# Patient Record
Sex: Female | Born: 1970
Health system: Southern US, Community
[De-identification: ages and names within clinical notes are randomized; demographics above are authoritative.]

## PROBLEM LIST (undated history)

## (undated) ENCOUNTER — Ambulatory Visit (HOSPITAL_BASED_OUTPATIENT_CLINIC_OR_DEPARTMENT_OTHER): Admission: EM | Source: Home / Self Care

## (undated) DIAGNOSIS — Z87442 Personal history of urinary calculi: Secondary | ICD-10-CM

## (undated) DIAGNOSIS — K591 Functional diarrhea: Secondary | ICD-10-CM

## (undated) DIAGNOSIS — F332 Major depressive disorder, recurrent severe without psychotic features: Secondary | ICD-10-CM

## (undated) DIAGNOSIS — G4730 Sleep apnea, unspecified: Secondary | ICD-10-CM

## (undated) DIAGNOSIS — R51 Headache: Secondary | ICD-10-CM

## (undated) DIAGNOSIS — F200 Paranoid schizophrenia: Secondary | ICD-10-CM

## (undated) DIAGNOSIS — D509 Iron deficiency anemia, unspecified: Secondary | ICD-10-CM

## (undated) DIAGNOSIS — Z915 Personal history of self-harm: Secondary | ICD-10-CM

## (undated) DIAGNOSIS — E785 Hyperlipidemia, unspecified: Secondary | ICD-10-CM

## (undated) DIAGNOSIS — C4491 Basal cell carcinoma of skin, unspecified: Secondary | ICD-10-CM

## (undated) DIAGNOSIS — F419 Anxiety disorder, unspecified: Secondary | ICD-10-CM

## (undated) DIAGNOSIS — T50902A Poisoning by unspecified drugs, medicaments and biological substances, intentional self-harm, initial encounter: Secondary | ICD-10-CM

## (undated) DIAGNOSIS — Z86711 Personal history of pulmonary embolism: Secondary | ICD-10-CM

## (undated) DIAGNOSIS — K219 Gastro-esophageal reflux disease without esophagitis: Secondary | ICD-10-CM

## (undated) DIAGNOSIS — F209 Schizophrenia, unspecified: Secondary | ICD-10-CM

## (undated) DIAGNOSIS — E559 Vitamin D deficiency, unspecified: Secondary | ICD-10-CM

## (undated) DIAGNOSIS — D649 Anemia, unspecified: Secondary | ICD-10-CM

## (undated) DIAGNOSIS — M1990 Unspecified osteoarthritis, unspecified site: Secondary | ICD-10-CM

## (undated) DIAGNOSIS — K5792 Diverticulitis of intestine, part unspecified, without perforation or abscess without bleeding: Secondary | ICD-10-CM

## (undated) DIAGNOSIS — T50901A Poisoning by unspecified drugs, medicaments and biological substances, accidental (unintentional), initial encounter: Secondary | ICD-10-CM

## (undated) DIAGNOSIS — N200 Calculus of kidney: Secondary | ICD-10-CM

## (undated) DIAGNOSIS — R519 Headache, unspecified: Secondary | ICD-10-CM

## (undated) DIAGNOSIS — G43909 Migraine, unspecified, not intractable, without status migrainosus: Secondary | ICD-10-CM

## (undated) DIAGNOSIS — D352 Benign neoplasm of pituitary gland: Secondary | ICD-10-CM

## (undated) DIAGNOSIS — Z9289 Personal history of other medical treatment: Secondary | ICD-10-CM

## (undated) DIAGNOSIS — I10 Essential (primary) hypertension: Secondary | ICD-10-CM

## (undated) DIAGNOSIS — Z8659 Personal history of other mental and behavioral disorders: Secondary | ICD-10-CM

## (undated) DIAGNOSIS — Z9151 Personal history of suicidal behavior: Secondary | ICD-10-CM

## (undated) DIAGNOSIS — N289 Disorder of kidney and ureter, unspecified: Secondary | ICD-10-CM

## (undated) DIAGNOSIS — R011 Cardiac murmur, unspecified: Secondary | ICD-10-CM

## (undated) DIAGNOSIS — F609 Personality disorder, unspecified: Secondary | ICD-10-CM

## (undated) DIAGNOSIS — K589 Irritable bowel syndrome without diarrhea: Secondary | ICD-10-CM

## (undated) DIAGNOSIS — F32A Depression, unspecified: Secondary | ICD-10-CM

## (undated) HISTORY — DX: Personal history of suicidal behavior: Z91.51

## (undated) HISTORY — DX: Personal history of self-harm: Z91.5

## (undated) HISTORY — PX: OTHER SURGICAL HISTORY: SHX169

## (undated) HISTORY — DX: Basal cell carcinoma of skin, unspecified: C44.91

## (undated) HISTORY — DX: Benign neoplasm of pituitary gland: D35.2

## (undated) HISTORY — PX: COLOSTOMY REVERSAL: SHX5782

## (undated) HISTORY — DX: Sleep apnea, unspecified: G47.30

## (undated) HISTORY — DX: Personal history of other mental and behavioral disorders: Z86.59

## (undated) HISTORY — DX: Migraine, unspecified, not intractable, without status migrainosus: G43.909

## (undated) HISTORY — DX: Iron deficiency anemia, unspecified: D50.9

## (undated) HISTORY — DX: Major depressive disorder, recurrent severe without psychotic features: F33.2

## (undated) HISTORY — PX: TUBAL LIGATION: SHX77

## (undated) HISTORY — DX: Vitamin D deficiency, unspecified: E55.9

## (undated) HISTORY — PX: POSTERIOR TIBIAL TENDON REPAIR: SHX6039

## (undated) HISTORY — DX: Irritable bowel syndrome, unspecified: K58.9

---

## 1998-10-29 ENCOUNTER — Ambulatory Visit (HOSPITAL_COMMUNITY): Admission: RE | Admit: 1998-10-29 | Discharge: 1998-10-29 | Payer: Self-pay | Admitting: Family Medicine

## 1999-04-13 ENCOUNTER — Ambulatory Visit (HOSPITAL_COMMUNITY): Admission: RE | Admit: 1999-04-13 | Discharge: 1999-04-13 | Payer: Self-pay | Admitting: Family Medicine

## 1999-06-10 ENCOUNTER — Emergency Department (HOSPITAL_COMMUNITY): Admission: EM | Admit: 1999-06-10 | Discharge: 1999-06-10 | Payer: Self-pay | Admitting: Emergency Medicine

## 1999-06-12 ENCOUNTER — Inpatient Hospital Stay (HOSPITAL_COMMUNITY): Admission: EM | Admit: 1999-06-12 | Discharge: 1999-06-13 | Payer: Self-pay | Admitting: Emergency Medicine

## 1999-06-12 ENCOUNTER — Encounter: Payer: Self-pay | Admitting: Internal Medicine

## 1999-09-23 ENCOUNTER — Ambulatory Visit (HOSPITAL_COMMUNITY): Admission: RE | Admit: 1999-09-23 | Discharge: 1999-09-23 | Payer: Self-pay | Admitting: Obstetrics and Gynecology

## 1999-09-24 ENCOUNTER — Inpatient Hospital Stay: Admission: AD | Admit: 1999-09-24 | Discharge: 1999-09-24 | Payer: Self-pay | Admitting: Obstetrics and Gynecology

## 1999-12-08 ENCOUNTER — Encounter: Admission: RE | Admit: 1999-12-08 | Discharge: 2000-03-07 | Payer: Self-pay | Admitting: Family Medicine

## 1999-12-30 ENCOUNTER — Other Ambulatory Visit: Admission: RE | Admit: 1999-12-30 | Discharge: 1999-12-30 | Payer: Self-pay | Admitting: Obstetrics and Gynecology

## 2000-01-01 ENCOUNTER — Inpatient Hospital Stay (HOSPITAL_COMMUNITY): Admission: EM | Admit: 2000-01-01 | Discharge: 2000-01-04 | Payer: Self-pay | Admitting: Emergency Medicine

## 2000-03-14 ENCOUNTER — Encounter: Admission: RE | Admit: 2000-03-14 | Discharge: 2000-06-12 | Payer: Self-pay | Admitting: Family Medicine

## 2000-05-09 ENCOUNTER — Emergency Department (HOSPITAL_COMMUNITY): Admission: EM | Admit: 2000-05-09 | Discharge: 2000-05-09 | Payer: Self-pay | Admitting: Emergency Medicine

## 2000-05-10 ENCOUNTER — Emergency Department (HOSPITAL_COMMUNITY): Admission: EM | Admit: 2000-05-10 | Discharge: 2000-05-10 | Payer: Self-pay | Admitting: Emergency Medicine

## 2000-05-11 ENCOUNTER — Inpatient Hospital Stay (HOSPITAL_COMMUNITY): Admission: EM | Admit: 2000-05-11 | Discharge: 2000-05-14 | Payer: Self-pay | Admitting: *Deleted

## 2000-06-21 ENCOUNTER — Encounter: Admission: RE | Admit: 2000-06-21 | Discharge: 2000-09-19 | Payer: Self-pay | Admitting: Family Medicine

## 2001-01-02 ENCOUNTER — Inpatient Hospital Stay (HOSPITAL_COMMUNITY): Admission: EM | Admit: 2001-01-02 | Discharge: 2001-01-04 | Payer: Self-pay | Admitting: Emergency Medicine

## 2001-01-02 ENCOUNTER — Encounter: Payer: Self-pay | Admitting: Emergency Medicine

## 2001-01-04 ENCOUNTER — Inpatient Hospital Stay (HOSPITAL_COMMUNITY): Admission: EM | Admit: 2001-01-04 | Discharge: 2001-01-06 | Payer: Self-pay | Admitting: *Deleted

## 2001-02-02 ENCOUNTER — Other Ambulatory Visit: Admission: RE | Admit: 2001-02-02 | Discharge: 2001-02-02 | Payer: Self-pay | Admitting: Obstetrics and Gynecology

## 2002-02-02 ENCOUNTER — Encounter: Payer: Self-pay | Admitting: Obstetrics and Gynecology

## 2002-02-02 ENCOUNTER — Encounter: Admission: RE | Admit: 2002-02-02 | Discharge: 2002-02-02 | Payer: Self-pay | Admitting: Obstetrics and Gynecology

## 2002-04-16 ENCOUNTER — Other Ambulatory Visit: Admission: RE | Admit: 2002-04-16 | Discharge: 2002-04-16 | Payer: Self-pay | Admitting: Obstetrics and Gynecology

## 2002-09-28 ENCOUNTER — Ambulatory Visit (HOSPITAL_COMMUNITY): Admission: RE | Admit: 2002-09-28 | Discharge: 2002-09-28 | Payer: Self-pay | Admitting: Family Medicine

## 2002-09-28 ENCOUNTER — Encounter: Payer: Self-pay | Admitting: Family Medicine

## 2003-05-22 ENCOUNTER — Other Ambulatory Visit: Admission: RE | Admit: 2003-05-22 | Discharge: 2003-05-22 | Payer: Self-pay | Admitting: Obstetrics and Gynecology

## 2003-06-17 ENCOUNTER — Encounter: Admission: RE | Admit: 2003-06-17 | Discharge: 2003-06-17 | Payer: Self-pay | Admitting: Family Medicine

## 2003-06-17 ENCOUNTER — Encounter: Payer: Self-pay | Admitting: Family Medicine

## 2003-10-09 ENCOUNTER — Inpatient Hospital Stay (HOSPITAL_COMMUNITY): Admission: EM | Admit: 2003-10-09 | Discharge: 2003-10-11 | Payer: Self-pay | Admitting: Psychiatry

## 2003-10-13 ENCOUNTER — Emergency Department (HOSPITAL_COMMUNITY): Admission: EM | Admit: 2003-10-13 | Discharge: 2003-10-14 | Payer: Self-pay | Admitting: Emergency Medicine

## 2003-10-14 ENCOUNTER — Encounter: Payer: Self-pay | Admitting: Emergency Medicine

## 2003-10-14 ENCOUNTER — Emergency Department (HOSPITAL_COMMUNITY): Admission: EM | Admit: 2003-10-14 | Discharge: 2003-10-14 | Payer: Self-pay | Admitting: Emergency Medicine

## 2004-05-28 ENCOUNTER — Other Ambulatory Visit: Admission: RE | Admit: 2004-05-28 | Discharge: 2004-05-28 | Payer: Self-pay | Admitting: Obstetrics and Gynecology

## 2004-09-09 ENCOUNTER — Inpatient Hospital Stay (HOSPITAL_COMMUNITY): Admission: RE | Admit: 2004-09-09 | Discharge: 2004-10-01 | Payer: Self-pay | Admitting: Psychiatry

## 2004-09-09 ENCOUNTER — Ambulatory Visit: Payer: Self-pay | Admitting: Psychiatry

## 2004-09-09 ENCOUNTER — Emergency Department (HOSPITAL_COMMUNITY): Admission: EM | Admit: 2004-09-09 | Discharge: 2004-09-10 | Payer: Self-pay | Admitting: Emergency Medicine

## 2004-09-10 ENCOUNTER — Emergency Department (HOSPITAL_COMMUNITY): Admission: EM | Admit: 2004-09-10 | Discharge: 2004-09-11 | Payer: Self-pay | Admitting: Emergency Medicine

## 2004-09-14 ENCOUNTER — Emergency Department (HOSPITAL_COMMUNITY): Admission: EM | Admit: 2004-09-14 | Discharge: 2004-09-14 | Payer: Self-pay | Admitting: Emergency Medicine

## 2004-09-27 ENCOUNTER — Emergency Department (HOSPITAL_COMMUNITY): Admission: EM | Admit: 2004-09-27 | Discharge: 2004-09-28 | Payer: Self-pay | Admitting: Emergency Medicine

## 2004-11-07 ENCOUNTER — Emergency Department (HOSPITAL_COMMUNITY): Admission: EM | Admit: 2004-11-07 | Discharge: 2004-11-07 | Payer: Self-pay | Admitting: Emergency Medicine

## 2005-07-12 ENCOUNTER — Other Ambulatory Visit: Admission: RE | Admit: 2005-07-12 | Discharge: 2005-07-12 | Payer: Self-pay | Admitting: Obstetrics and Gynecology

## 2005-09-15 ENCOUNTER — Emergency Department: Payer: Self-pay | Admitting: Emergency Medicine

## 2005-09-16 ENCOUNTER — Emergency Department (HOSPITAL_COMMUNITY): Admission: EM | Admit: 2005-09-16 | Discharge: 2005-09-16 | Payer: Self-pay | Admitting: *Deleted

## 2006-02-22 ENCOUNTER — Emergency Department (HOSPITAL_COMMUNITY): Admission: EM | Admit: 2006-02-22 | Discharge: 2006-02-22 | Payer: Self-pay | Admitting: Emergency Medicine

## 2006-08-02 ENCOUNTER — Other Ambulatory Visit: Admission: RE | Admit: 2006-08-02 | Discharge: 2006-08-02 | Payer: Self-pay | Admitting: Obstetrics and Gynecology

## 2007-03-26 ENCOUNTER — Encounter: Admission: RE | Admit: 2007-03-26 | Discharge: 2007-03-26 | Payer: Self-pay | Admitting: Obstetrics and Gynecology

## 2007-08-09 ENCOUNTER — Encounter: Admission: RE | Admit: 2007-08-09 | Discharge: 2007-08-09 | Payer: Self-pay | Admitting: Family Medicine

## 2007-08-15 ENCOUNTER — Other Ambulatory Visit: Admission: RE | Admit: 2007-08-15 | Discharge: 2007-08-15 | Payer: Self-pay | Admitting: Obstetrics and Gynecology

## 2008-05-22 ENCOUNTER — Emergency Department (HOSPITAL_COMMUNITY): Admission: EM | Admit: 2008-05-22 | Discharge: 2008-05-22 | Payer: Self-pay | Admitting: Emergency Medicine

## 2008-08-15 ENCOUNTER — Other Ambulatory Visit: Admission: RE | Admit: 2008-08-15 | Discharge: 2008-08-15 | Payer: Self-pay | Admitting: Obstetrics and Gynecology

## 2008-08-22 ENCOUNTER — Encounter: Admission: RE | Admit: 2008-08-22 | Discharge: 2008-08-22 | Payer: Self-pay | Admitting: Obstetrics and Gynecology

## 2009-08-18 ENCOUNTER — Other Ambulatory Visit: Admission: RE | Admit: 2009-08-18 | Discharge: 2009-08-18 | Payer: Self-pay | Admitting: Obstetrics and Gynecology

## 2009-08-25 ENCOUNTER — Emergency Department (HOSPITAL_COMMUNITY): Admission: EM | Admit: 2009-08-25 | Discharge: 2009-08-26 | Payer: Self-pay | Admitting: Emergency Medicine

## 2009-08-27 ENCOUNTER — Encounter: Admission: RE | Admit: 2009-08-27 | Discharge: 2009-08-27 | Payer: Self-pay | Admitting: Obstetrics and Gynecology

## 2009-11-12 ENCOUNTER — Ambulatory Visit (HOSPITAL_COMMUNITY): Admission: RE | Admit: 2009-11-12 | Discharge: 2009-11-12 | Payer: Self-pay | Admitting: Psychiatry

## 2010-01-29 ENCOUNTER — Emergency Department (HOSPITAL_COMMUNITY): Admission: EM | Admit: 2010-01-29 | Discharge: 2010-02-05 | Payer: Self-pay | Admitting: Emergency Medicine

## 2010-04-09 ENCOUNTER — Encounter: Admission: RE | Admit: 2010-04-09 | Discharge: 2010-04-09 | Payer: Self-pay | Admitting: Obstetrics and Gynecology

## 2010-12-16 ENCOUNTER — Encounter
Admission: RE | Admit: 2010-12-16 | Discharge: 2011-01-26 | Payer: Self-pay | Source: Home / Self Care | Attending: Obstetrics and Gynecology | Admitting: Obstetrics and Gynecology

## 2011-01-17 ENCOUNTER — Encounter: Payer: Self-pay | Admitting: Neurological Surgery

## 2011-01-17 ENCOUNTER — Encounter: Payer: Self-pay | Admitting: Obstetrics and Gynecology

## 2011-02-08 ENCOUNTER — Ambulatory Visit: Payer: 59 | Attending: Obstetrics and Gynecology | Admitting: *Deleted

## 2011-02-08 DIAGNOSIS — R7309 Other abnormal glucose: Secondary | ICD-10-CM | POA: Insufficient documentation

## 2011-02-08 DIAGNOSIS — Z713 Dietary counseling and surveillance: Secondary | ICD-10-CM | POA: Insufficient documentation

## 2011-02-10 ENCOUNTER — Ambulatory Visit: Payer: Self-pay

## 2011-03-01 ENCOUNTER — Encounter: Payer: 59 | Attending: Obstetrics and Gynecology | Admitting: *Deleted

## 2011-03-01 DIAGNOSIS — Z713 Dietary counseling and surveillance: Secondary | ICD-10-CM | POA: Insufficient documentation

## 2011-03-01 DIAGNOSIS — R7309 Other abnormal glucose: Secondary | ICD-10-CM | POA: Insufficient documentation

## 2011-03-17 LAB — DIFFERENTIAL
Basophils Absolute: 0 10*3/uL (ref 0.0–0.1)
Basophils Relative: 1 % (ref 0–1)
Eosinophils Absolute: 0 10*3/uL (ref 0.0–0.7)
Eosinophils Relative: 1 % (ref 0–5)
Lymphocytes Relative: 24 % (ref 12–46)
Lymphs Abs: 1.2 10*3/uL (ref 0.7–4.0)
Monocytes Absolute: 0.4 10*3/uL (ref 0.1–1.0)
Monocytes Relative: 7 % (ref 3–12)
Neutro Abs: 3.4 10*3/uL (ref 1.7–7.7)
Neutrophils Relative %: 68 % (ref 43–77)

## 2011-03-17 LAB — BASIC METABOLIC PANEL
BUN: 9 mg/dL (ref 6–23)
CO2: 23 mEq/L (ref 19–32)
Calcium: 8.9 mg/dL (ref 8.4–10.5)
Chloride: 108 mEq/L (ref 96–112)
Creatinine, Ser: 0.82 mg/dL (ref 0.4–1.2)
GFR calc Af Amer: >60 mL/min (ref >60)
GFR calc non Af Amer: >60 mL/min (ref >60)
Glucose, Bld: 111 mg/dL — ABNORMAL HIGH (ref 70–99)
Potassium: 3.7 mEq/L (ref 3.5–5.1)
Sodium: 138 mEq/L (ref 135–145)

## 2011-03-17 LAB — CBC
HCT: 37.3 % (ref 36.0–46.0)
Hemoglobin: 12.8 g/dL (ref 12.0–15.0)
MCHC: 34.3 g/dL (ref 30.0–36.0)
MCV: 85.8 fL (ref 78.0–100.0)
Platelets: 274 10*3/uL (ref 150–400)
RBC: 4.35 MIL/uL (ref 3.87–5.11)
RDW: 13.8 % (ref 11.5–15.5)
WBC: 5 10*3/uL (ref 4.0–10.5)

## 2011-03-17 LAB — RAPID URINE DRUG SCREEN, HOSP PERFORMED
Amphetamines: NOT DETECTED
Barbiturates: NOT DETECTED
Benzodiazepines: POSITIVE — AB
Cocaine: NOT DETECTED
Opiates: NOT DETECTED
Tetrahydrocannabinol: NOT DETECTED

## 2011-03-17 LAB — ETHANOL: Alcohol, Ethyl (B): <5 mg/dL (ref 0–10)

## 2011-03-17 LAB — SALICYLATE LEVEL: Salicylate Lvl: <4 mg/dL (ref 2.8–20.0)

## 2011-03-17 LAB — ACETAMINOPHEN LEVEL: Acetaminophen (Tylenol), Serum: <10 ug/mL — ABNORMAL LOW (ref 10–30)

## 2011-03-17 LAB — TRICYCLICS SCREEN, URINE: TCA Scrn: NOT DETECTED

## 2011-03-17 LAB — PREGNANCY, URINE: Preg Test, Ur: NEGATIVE

## 2011-04-01 ENCOUNTER — Encounter: Payer: 59 | Admitting: *Deleted

## 2011-04-03 LAB — CSF CELL COUNT WITH DIFFERENTIAL: RBC Count, CSF: 0 /mm3

## 2011-04-03 LAB — GLUCOSE, CSF: Glucose, CSF: 64 mg/dL (ref 43–76)

## 2011-04-03 LAB — PROTEIN, CSF: Total  Protein, CSF: 47 mg/dL — ABNORMAL HIGH (ref 15–45)
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
## 2011-05-14 NOTE — H&P (Signed)
[name]  Patient:    [name] [name], [name] [name]                       MRN: [id number] Adm. Date:  [id number] Attending:  Cynda Familia CC:         Carola J. Gerri Spore, M.D., [name], ([facility])                         History and Physical  DATE OF BIRTH: 1971/01/04  ADMISSION DIAGNOSES:  1. Drug overdose (presumed mirtazapine, molindone, and ziprasidone).     a. Lethargy secondary to overdose.  2. Depression.  3. History of anorexia and bulimia.  4. History of tricuspid regurgitation.  CHIEF COMPLAINT: Drug overdose.  HISTORY OF PRESENT ILLNESS: [name] [name] is a 40 year old white female with a history of depression and prior intentional overdose in January 2001, who has been intermittently suicidal over the past several days.  She had been seeing her counselor Lenore Cordia).  [name] [name] apparently signed commitment papers on [name] [name] before this weekend.  [name] [name] was evaluated at [facility] and was released.  [name] again saw [name] [name] today and there was a difference of opinion and some apparent conflict.  This history is per [name] [name].  Afterward [name] [name] called her [name] to explain that she had overdosed on three of her medications (mirtazapine, molindone, and ziprasidone).  She takes no other medications. Her [name] says that this was similar to her overdose in January 2001.  He did not see any sign of her having taken any other medication, such as Ambien, which she has available.  She brought in some of the tablets with her but it is unknown how many she took.  She thinks she may have taken more than #30 tablets.  Her [name] brought her to the emergency room in a lethargic condition.  She was found to be slow to arouse but answered simple questions. She denied any pain, headache, nausea, chest discomfort, shortness of breath, or abdominal discomfort.   She was given charcoal in the emergency room. Extensive history has been difficult to obtain.  PAST MEDICAL HISTORY: As per admission diagnoses.  CURRENT MEDICATIONS:  1. Mirtazapine.  2. Molindone.  3. Ziprasidone.  4. She also apparently has Ambien available for sleep.  ALLERGIES: No known drug allergies.  SOCIAL HISTORY: This history is difficult to obtain secondary to lethargy.  FAMILY HISTORY: This history is difficult to obtain secondary to lethargy.  REVIEW OF SYSTEMS: The Review Of Systems is difficult to obtain.  No headache, visual difficulty, chest discomfort, shortness of breath, cough, abdominal discomfort, nausea, vomiting, diarrhea, discomfort of extremities, rash.  PHYSICAL EXAMINATION:  GENERAL: She is lethargic but arousable.  When aroused she is oriented x 3. She can respond to simple questions but essentially gives single statement answers.  HEENT: Tympanic membranes are clear.  Extraocular movements were intact. Pupils are mildly constricted and sluggish but reactive.  Oropharynx is clear. Intact gag reflex.  Normal tongue protrusion.  Charcoal is noted in the mouth.  NECK: Supple, without tenderness.  No thyromegaly or thyroid nodule.  CHEST: Clear to auscultation bilaterally.  Normal respiratory effort.  HEART: Regular, without murmur.  ABDOMEN: Soft, nontender, nondistended.  No masses.  EXTREMITIES: Grossly normal strength and range of motion in all major muscle groups.  No joint swelling or erythema.  SKIN: Without  trauma, ecchymosis, or rash.  NEUROLOGIC: Deep tendon reflexes are 3+ and symmetric at both biceps, triceps, patellar, and Achilles tendons.  Oriented x 3.  No abnormal movements.  No obvious focal neurologic deficit.  LABORATORY DATA: ECG shows sinus rhythm, no QT prolongation, no ischemic changes.  Laboratory values show a sodium of 142, chloride 109, potassium 4.2, bicarbonate 18, BUN 9, creatinine 1.2, glucose 103.  WBC 6.2,  hemoglobin 13.6; platelets 310,000.  ABG shows a pH of 7.456, pCO2 24.8, oxygen saturation on room air 94%.  Acetaminophen level was less than 10.  Salicylate level was less than 4.  Alcohol level negative.  ASSESSMENT: [name] [name] is a 40 year old female with a history of depression, who took an unknown amount/dosage of mirtazapine, molindone, and ziprasidone in a suicidal attempt today.  It is unsure if other medications were taken. In the emergency department she is hemodynamically stable but lethargic.  The information gathered from the [facility] shows that these medications have the possibility of creating hypotension, lethargy, tachycardia, and rhythm disturbance, but otherwise there does not appear to be a significant threat of long-term ramification.  It appears that she will simply wake up out of the effects of the medication and likely will do well.  PLAN: She has been given charcoal in the emergency room and she is being admitted for observation overnight to a telemetry bed in the ICU.  Suicide precautions are in place and she is being given IV fluids, and is currently NPO.  The [facility] hospitalists will assume care in the morning. DD:  01/02/01 TD:  01/03/01 Job: [id number]

## 2011-05-14 NOTE — Discharge Summary (Signed)
NAMELOUELLEN, HALDEMAN NO.:  [id number]   MEDICAL RECORD NO.:  [id number]          PATIENT TYPE:  IPS   LOCATION:  0505                          FACILITY:  [facility]   PHYSICIAN:  Jeanice Lim, M.D. DATE OF BIRTH:  August 07, 1971   DATE OF ADMISSION:  09/09/2004  DATE OF DISCHARGE:  10/01/2004                                 DISCHARGE SUMMARY   IDENTIFYING DATA:  The patient was admitted to the services of Syed T.  Lolly Mustache, M.D.  This is a 40 year old married Caucasian female voluntarily  admitted.  Presenting with a history of depression which had been increased  over the last 1-2 weeks.  Called therapist and therapist recommended  admission.  The patient had been reporting suicidal thoughts, feelings of  agitation, tired with difficulty sleep, suicidal thoughts with no plan.  History of stressors include son with ADHD and he had failure to thrive as  an infant.  Husband was in [place] and is home now but had been gone for some  time.  The patient had remained out of the hospital and doing fairly well on  Lexapro 40 mg a day.  Contacting outpatient therapist and psychiatrist due  to the severity of patient's suicidal complaints and mood symptoms.  Report  from outpatient treaters as well as family as patient has only responded  well to Lexapro, which had been cut down due to insurance not preauthorizing  the full 40 mg before she had to cut it back to 20 mg a day and associates  this with the worsening of mood, which all are in agreement as far as  outpatient treaters.   PAST PSYCHIATRIC HISTORY:  Second hospitalization.  Here two years ago.  Followed up by Dr. Milford Cage.  History of a suicide attempt by overdose  x1.  Had been in a coma for multiple days.  History of anorexia and  diagnosed with possible personality disorder, history of a eating disorder,  reactivity anxiety and depression.   FAMILY HISTORY:  No family history noted for significant psychiatric  conditions.   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  Therefore, is aware of this process and fears someday that she will end up  at the state hospital.   MEDICAL PROBLEMS:  Heart murmur.   MEDICATIONS:  Lexapro 20 mg b.i.d. (which patient had been on for 1-1/2  years and again had cut back herself due to medications not being  preauthorized by insurance, only paying for 30 pills rather than 60.  The  patient also taking Ambien 10 mg q.h.s. and Seroquel.  Had been started on  Seroquel 100 mg for five days and felt increasingly distressed.  Apparently  had been tried on Risperdal among other medicines and maybe due to initial  side effects from medications felt more out of control, desperate and had  increased short-term agitation, not tolerating medication changes very well  in the past.   ALLERGIES:  AUGMENTIN.   PHYSICAL EXAMINATION:  Physical and neurological exam essentially within  normal limits.   LABORATORY DATA:  Routine admission labs essentially within normal limits.   MENTAL STATUS EXAM:  Awake.  Poor eye contact.  Soft-spoken.  Tired.  Affect  somewhat sedated, restricted, depressed, withdrawn, isolative, guarded  regarding safety issues and going into details of history.  However, the  patient, after supportive discussion, would then open up more and showed  goal directed thoughts.  No overt psychotic symptoms.  No hallucinations.  No clear paranoid ideation.  Some thought distortions related to depression  and questionable mood stability with periods of being hyper-productive or  hyperthymic by patient's report.  Mood swings had not been observed by  outpatient treaters nor family.  Cognitively, the patient was intact for  short-term memory.  Concentration and memory intact.  Judgment and insight  were fair.  The patient was aware that she needed to get  treatment and felt  unsafe.  The patient was somewhat a poor historian due to her sedated state  and also defensiveness.   ADMISSION DIAGNOSES:   AXIS I:  1.  Depressive disorder not otherwise specified, (outpatient report is      recurrent major depression, severe).  2.  Anxiety disorder not otherwise specified.  3.  Rule out bipolar disorder, type 2.   AXIS II:  Deferred.  However suggestive of borderline features and possibly  meeting criteria for disorder when mood is not stable.   AXIS III:  History of heart murmur only.   AXIS IV:  Moderate (stressors with problems with primary support group,  feeling suffocated by husband, having to readjust to husband coming back and  feeling not as close to him.  This is described as stress as well as stress  of caring for her children; son with attention-deficit hyperactivity  disorder and also in psychiatric treatment and then managing her own mood  condition are all stressors).   AXIS V:  25-30; past year 34.   HOSPITAL COURSE:  The patient was admitted voluntarily for depression and  suicidal ideation.  Was able to contract for safety and was admitted for  stabilization on medications.  Monitoring safety, 15-minute checks as long  able to contract.  To order a family session and continue contact with  outpatient treaters regarding formulating a treatment plan and aftercare  plan in light of patient's complex psychiatric history and degree of  distress prior to admission.  The patient, after being admission, started  routine p.r.n.'s and encouraged to participate in individual and group  therapy.  Remained somewhat isolative, withdrawn, complaining of somatic  complaints, nausea, vomiting, dizziness.  Felt that her mood would be fine  if she could just physically feel better and felt that the physical symptoms may be related to Seroquel, which was a new medicine.  The patient recently  had been tried on Zyprexa and Celexa and  reported that both medicines  knocked her out cold and that she was then very weak when she came out of  it.  The patient had a fall on September 10, 2004.  The patient's husband  was notified.  The patient was sent to ER for clearance.  The patient was  then brought back from the ER with labs and a head CT, which was negative.  The patient, the following day, complained of continued nausea, vomiting and  dizziness, poor p.o. intake.  Worried with decreased Lexapro.  Concerned  about mood induction and the Lexapro increase at the time of admission  causing nausea and vomiting  was the reason for decrease in Lexapro.  The  patient denied any other psychotic symptoms after considering outpatient  feedback regarding medications and family's feedback.  Lexapro was re-  optimized to 40 mg along with Ambien and Seroquel and vital signs should be  checked more frequently as well as fluids and orthostatic hypotension  monitored for.  The patient reported feeling a little better, less  depressed, was up for a short time, out of bed interacting some yesterday.  That was on September 12, 2004.  On September 13, 2004, seen by her  outpatient psychiatrist covering for the weekend, Dr. Milford Cage.  Reported mood was much improved, more verbal and spontaneous with good eye  contact.  Stating that she was starting to feel better.  Had been sleep-  deprived before admission and has been able to catch up on sleep.  Reported  some anxiety and wanted to restart Xanax.  On September 14, 2004, the  patient complained of nausea and vomiting the previous night and reported  increased nausea since Thursday, possibly related to the Seroquel.  Reported  some depression, fleeting suicidal ideation, which had significantly  decreased, able to contract for safety, no acute suicidal intent.  Sleeping  with Seroquel.  Previously did with Ambien prior to admission.  Therefore,  the plan was to continue the Lexapro 40 mg,  decrease benzodiazepine and  change Phenergan to IM due to nausea and vomiting, to discontinue Seroquel  due to possible side effects, restart Ambien due to previous tolerance and  response.  Milford Cage was also called regarding treatment plan since she  saw the patient over the weekend, later on September 14, 2004, shortly after  lunch.  The patient was found hanging from neck after having asked for  p.r.n. medications, had twisted the bed sheet over bathroom door and some  loss of continence, slowing of eyes and bluish color.  The patient, however,  was breathing on her own.  Petechial hemorrhages were noticed on the edges  of the conjunctiva.  The patient responded to touch but not responsive to  voice commands.  911, of course, was immediately notified and patient was  transported to the emergency room.  The patient apparently had requested a piece of paper and a pencil from a counselor just prior to this attempt to  scribble out an apologizing suicide note and later described that she had  had an anxiety episode in the lunch room and, when returned, saw a hole in  the sheet and then thought of the plan to hang herself.  This was described  as being quite impulsive and not like herself.  She had been overwhelmed by  multiple issues.  Denied suicidal ideation on the following day but  concerned by patient suddenly becoming impulsive again and the  unpredictability of this past attempt was quite concerning.  The patient,  again, described history of some possible hyperthymic episodes where she was  overproductive and sleeping 3-5 hours max with high energy.  Had difficulty  with caring for self and little support.  Complained of significant anxiety,  decreased nausea, no acute suicidal ideation, continued depression.  The  patient was advised of risk/benefit ratio and alternative treatments  regarding medications and Xanax was continued.  Lamictal discussed regarding  risk of rash and  benefit of possible stabilizing anxiety and recurrent  depressive episodes with picture of possible mood instability and Risperdal  due to degree of agitated thoughts as well as possibly using  Tegretol due to  impulsivity.  Apparently, although patient was agreeable to this, the  patient then complained to husband and spent over an hour on the phone with  the husband.  Husband was upset that patient was not better already.  Felt  the patient would probably do better outside of the hospital despite this  near-lethal attempt the previous day and not happy regarding the medication  changes due to her doing well on Lexapro alone before.  Therefore, after  discussing medications with outpatient psychiatrist, the previous plan was  resumed.  The patient was continued on one-to-one, of course, after this  attempt with clear expectations regarding being able to come off of one-to-  one due to the near-lethal nature and impulsive nature of this attempt and  the patient's vague reference to feeling that it was so easy and that she  knows now she could at any time just use a sheet and not feel pain.  The  patient also described struggling over the thoughts of being six feet under  the ground versus 2000 square feet, which is meant possibly the size of her  house, deciding on whether she should live or die.  The patient, due to  dangerousness of her attempt, multidisciplinary team, both inpatient and  outpatient, was held to discuss treatment planning and need for clear  criteria for safety prior to discharge and dynamic of husband's involvement,  who is supportive but may be perceived as controlling at times and patient  reacts to this, feeling that he has hijacked her treatment and then acts  out.  Apparently, acts out regarding the actual treatment to the husband,  getting the husband more upset and to minimize that the splitting and counter-therapeutic nature visits were restricted to a tolerable  level,  based on the patient's request.  One-on-one visits with the children were  made possible with a supervising one-on-one staff and patient appeared to be  able to tolerate these changes very well and benefit from visits, feeling  better after visits.  The patient was agreeable to follow up with the  treatment plan and contract for safety.  A safety contract was reviewed with  the patient and family and mother and husband were involved and patient was  eventually discharged in improved condition.  Mood was euthymic.  Affect was  brighter.  The patient was comfortable with the safety plan as well as the  aftercare plan, which was quite intensive and all inpatient and outpatient  providers were also comfortable.  This collaboration was necessary due to  the dangerousness of her attempt and concern of maintaining safety and  stability once the patient gets out of the hospital.   CONDITION ON DISCHARGE:  The patient was discharged in significantly  improved condition.  There was no mood instability.  No psychotic symptoms.  No suicidal or homicidal ideation.  No report of suicidal thoughts to family  members.  No dangerous behavior.  Appropriate on the unit.  Able to cope  healthier with her anxiety.  Given medication education.   DISCHARGE MEDICATIONS:  1.  Xanax 0.5 mg, 1/2 every six hours p.r.n. anxiety attacks and 1/2 at 12      p.m., 1/2 at 4 p.m. and 2 at 9 p.m.  2.  Protonix 40 mg q.a.m.  3.  Zyprexa 2.5 mg at 8 p.m.  4.  Ambien 10 mg, 1-2 q.h.s. p.r.n.   FOLLOW UP:  The patient was to follow safety and aftercare plan  which was  detailed and copies given to family outpatient Tanganika Barradas prior to discharge.  The patient was written out of work due to medical reasons for at least two  weeks, will be reevaluated in the outpatient setting before being released  to return to work to ensure stability before returning to work.  The patient  had close followup appointments with Dr.  Milford Cage on Wednesday, October  12th at 8:30 and Clarisse Gouge __________ on October 11th at 9 p.m.   DISCHARGE DIAGNOSES:   AXIS I:  1.  Depressive disorder not otherwise specified, (outpatient report is      recurrent major depression, severe).  2.  Anxiety disorder not otherwise specified.  3.  Rule out bipolar disorder, type 2.   AXIS II:  Deferred.  However suggestive of borderline features and possibly  meeting criteria for disorder when mood is not stable.   AXIS III:  History of heart murmur only.   AXIS IV:  Moderate (stressors with problems with primary support group,  feeling suffocated by husband, having to readjust to husband coming back and  feeling not as close to him.  This is described as stress as well as stress  of caring for her children; son with attention-deficit hyperactivity  disorder and also in psychiatric treatment and then managing her own mood  condition are all stressors).  AXIS V:  Global Assessment of Functioning on discharge 55-60.     Jame   JEM/MEDQ  D:  11/24/2004  T:  11/24/2004  Job:  [id number]

## 2011-07-06 ENCOUNTER — Other Ambulatory Visit: Payer: Self-pay | Admitting: Family Medicine

## 2011-07-06 DIAGNOSIS — Z1231 Encounter for screening mammogram for malignant neoplasm of breast: Secondary | ICD-10-CM

## 2011-08-11 ENCOUNTER — Ambulatory Visit
Admission: RE | Admit: 2011-08-11 | Discharge: 2011-08-11 | Disposition: A | Discharge status: Home / Self Care | Payer: Medicare Other | Source: Ambulatory Visit | Attending: Family Medicine | Admitting: Family Medicine

## 2011-08-11 DIAGNOSIS — Z1231 Encounter for screening mammogram for malignant neoplasm of breast: Secondary | ICD-10-CM

## 2011-09-22 LAB — DIFFERENTIAL
Basophils Relative: 1
Eosinophils Absolute: 0.1
Monocytes Absolute: 0.6
Monocytes Relative: 9
Neutrophils Relative %: 67

## 2011-09-22 LAB — POCT CARDIAC MARKERS: Myoglobin, poc: 67.6

## 2011-09-22 LAB — POCT PREGNANCY, URINE
Operator id: 272551
Preg Test, Ur: NEGATIVE

## 2011-09-22 LAB — URINALYSIS, ROUTINE W REFLEX MICROSCOPIC
Ketones, ur: NEGATIVE
Nitrite: NEGATIVE
Protein, ur: NEGATIVE
Urobilinogen, UA: 0.2

## 2011-09-22 LAB — BASIC METABOLIC PANEL
BUN: 14
CO2: 21
Chloride: 106
Creatinine, Ser: 0.92
GFR calc Af Amer: >60

## 2011-09-22 LAB — CBC
MCHC: 33.8
MCV: 86
RBC: 4.24

## 2011-12-28 DIAGNOSIS — Z86711 Personal history of pulmonary embolism: Secondary | ICD-10-CM

## 2011-12-28 DIAGNOSIS — F332 Major depressive disorder, recurrent severe without psychotic features: Secondary | ICD-10-CM | POA: Diagnosis present

## 2011-12-28 DIAGNOSIS — Z5189 Encounter for other specified aftercare: Secondary | ICD-10-CM | POA: Diagnosis not present

## 2011-12-28 DIAGNOSIS — Z818 Family history of other mental and behavioral disorders: Secondary | ICD-10-CM | POA: Diagnosis not present

## 2011-12-28 DIAGNOSIS — Z881 Allergy status to other antibiotic agents status: Secondary | ICD-10-CM | POA: Diagnosis not present

## 2011-12-28 DIAGNOSIS — R937 Abnormal findings on diagnostic imaging of other parts of musculoskeletal system: Secondary | ICD-10-CM | POA: Diagnosis not present

## 2011-12-28 DIAGNOSIS — F329 Major depressive disorder, single episode, unspecified: Secondary | ICD-10-CM | POA: Diagnosis not present

## 2011-12-28 HISTORY — PX: ABDOMINAL SURGERY: SHX537

## 2011-12-28 HISTORY — DX: Personal history of pulmonary embolism: Z86.711

## 2012-01-04 DIAGNOSIS — R809 Proteinuria, unspecified: Secondary | ICD-10-CM | POA: Diagnosis not present

## 2012-01-04 DIAGNOSIS — R350 Frequency of micturition: Secondary | ICD-10-CM | POA: Diagnosis not present

## 2012-01-04 DIAGNOSIS — F329 Major depressive disorder, single episode, unspecified: Secondary | ICD-10-CM | POA: Diagnosis not present

## 2012-01-04 DIAGNOSIS — M549 Dorsalgia, unspecified: Secondary | ICD-10-CM | POA: Diagnosis not present

## 2012-01-06 DIAGNOSIS — F333 Major depressive disorder, recurrent, severe with psychotic symptoms: Secondary | ICD-10-CM | POA: Diagnosis not present

## 2012-01-06 DIAGNOSIS — F200 Paranoid schizophrenia: Secondary | ICD-10-CM | POA: Diagnosis not present

## 2012-01-13 DIAGNOSIS — N390 Urinary tract infection, site not specified: Secondary | ICD-10-CM | POA: Diagnosis not present

## 2012-01-13 DIAGNOSIS — R319 Hematuria, unspecified: Secondary | ICD-10-CM | POA: Diagnosis not present

## 2012-01-19 DIAGNOSIS — F333 Major depressive disorder, recurrent, severe with psychotic symptoms: Secondary | ICD-10-CM | POA: Diagnosis not present

## 2012-01-25 DIAGNOSIS — F333 Major depressive disorder, recurrent, severe with psychotic symptoms: Secondary | ICD-10-CM | POA: Diagnosis not present

## 2012-01-31 DIAGNOSIS — J0100 Acute maxillary sinusitis, unspecified: Secondary | ICD-10-CM | POA: Diagnosis not present

## 2012-02-03 DIAGNOSIS — F333 Major depressive disorder, recurrent, severe with psychotic symptoms: Secondary | ICD-10-CM | POA: Diagnosis not present

## 2012-02-08 DIAGNOSIS — F333 Major depressive disorder, recurrent, severe with psychotic symptoms: Secondary | ICD-10-CM | POA: Diagnosis not present

## 2012-02-15 DIAGNOSIS — F333 Major depressive disorder, recurrent, severe with psychotic symptoms: Secondary | ICD-10-CM | POA: Diagnosis not present

## 2012-02-17 DIAGNOSIS — F200 Paranoid schizophrenia: Secondary | ICD-10-CM | POA: Diagnosis not present

## 2012-02-21 DIAGNOSIS — N3020 Other chronic cystitis without hematuria: Secondary | ICD-10-CM | POA: Diagnosis not present

## 2012-02-21 DIAGNOSIS — N3289 Other specified disorders of bladder: Secondary | ICD-10-CM | POA: Diagnosis not present

## 2012-02-21 DIAGNOSIS — R310 Gross hematuria: Secondary | ICD-10-CM | POA: Diagnosis not present

## 2012-02-21 DIAGNOSIS — R350 Frequency of micturition: Secondary | ICD-10-CM | POA: Diagnosis not present

## 2012-02-22 DIAGNOSIS — F333 Major depressive disorder, recurrent, severe with psychotic symptoms: Secondary | ICD-10-CM | POA: Diagnosis not present

## 2012-02-29 DIAGNOSIS — F333 Major depressive disorder, recurrent, severe with psychotic symptoms: Secondary | ICD-10-CM | POA: Diagnosis not present

## 2012-03-07 DIAGNOSIS — F333 Major depressive disorder, recurrent, severe with psychotic symptoms: Secondary | ICD-10-CM | POA: Diagnosis not present

## 2012-03-14 DIAGNOSIS — R310 Gross hematuria: Secondary | ICD-10-CM | POA: Diagnosis not present

## 2012-03-14 DIAGNOSIS — N3020 Other chronic cystitis without hematuria: Secondary | ICD-10-CM | POA: Diagnosis not present

## 2012-03-14 DIAGNOSIS — N200 Calculus of kidney: Secondary | ICD-10-CM | POA: Diagnosis not present

## 2012-03-14 DIAGNOSIS — R350 Frequency of micturition: Secondary | ICD-10-CM | POA: Diagnosis not present

## 2012-03-16 DIAGNOSIS — F333 Major depressive disorder, recurrent, severe with psychotic symptoms: Secondary | ICD-10-CM | POA: Diagnosis not present

## 2012-03-17 ENCOUNTER — Other Ambulatory Visit: Payer: Self-pay | Admitting: Radiology

## 2012-03-17 ENCOUNTER — Other Ambulatory Visit: Payer: Self-pay | Admitting: Urology

## 2012-03-17 DIAGNOSIS — N200 Calculus of kidney: Secondary | ICD-10-CM | POA: Diagnosis not present

## 2012-03-17 DIAGNOSIS — N1330 Unspecified hydronephrosis: Secondary | ICD-10-CM | POA: Diagnosis not present

## 2012-03-17 DIAGNOSIS — N201 Calculus of ureter: Secondary | ICD-10-CM

## 2012-03-17 DIAGNOSIS — N269 Renal sclerosis, unspecified: Secondary | ICD-10-CM | POA: Diagnosis not present

## 2012-03-20 ENCOUNTER — Other Ambulatory Visit: Payer: Self-pay | Admitting: Physician Assistant

## 2012-03-21 ENCOUNTER — Ambulatory Visit (HOSPITAL_COMMUNITY)
Admission: RE | Admit: 2012-03-21 | Discharge: 2012-03-21 | Disposition: A | Discharge status: Home / Self Care | Payer: 59 | Source: Ambulatory Visit | Attending: Urology | Admitting: Urology

## 2012-03-21 ENCOUNTER — Other Ambulatory Visit: Payer: Self-pay | Admitting: Urology

## 2012-03-21 DIAGNOSIS — N1330 Unspecified hydronephrosis: Secondary | ICD-10-CM | POA: Insufficient documentation

## 2012-03-21 DIAGNOSIS — R350 Frequency of micturition: Secondary | ICD-10-CM | POA: Insufficient documentation

## 2012-03-21 DIAGNOSIS — N201 Calculus of ureter: Secondary | ICD-10-CM

## 2012-03-21 LAB — CBC
HCT: 43 % (ref 36.0–46.0)
MCH: 27.9 pg (ref 26.0–34.0)
MCHC: 33 g/dL (ref 30.0–36.0)
MCV: 84.5 fL (ref 78.0–100.0)
RDW: 13.5 % (ref 11.5–15.5)

## 2012-03-21 LAB — BASIC METABOLIC PANEL
BUN: 15 mg/dL (ref 6–23)
CO2: 25 mEq/L (ref 19–32)
Calcium: 9.6 mg/dL (ref 8.4–10.5)
Creatinine, Ser: 1.17 mg/dL — ABNORMAL HIGH (ref 0.50–1.10)
Glucose, Bld: 114 mg/dL — ABNORMAL HIGH (ref 70–99)

## 2012-03-21 MED ORDER — CIPROFLOXACIN IN D5W 400 MG/200ML IV SOLN
INTRAVENOUS | Status: AC
  Filled 2012-03-21: qty 200

## 2012-03-21 MED ORDER — CIPROFLOXACIN IN D5W 400 MG/200ML IV SOLN
400.0000 mg | Freq: Once | INTRAVENOUS | Status: AC
  Administered 2012-03-21: 400 mg via INTRAVENOUS

## 2012-03-21 MED ORDER — SODIUM CHLORIDE 0.9 % IV SOLN: Freq: Once | INTRAVENOUS | Status: DC

## 2012-03-21 MED ORDER — FENTANYL CITRATE 0.05 MG/ML IJ SOLN
INTRAMUSCULAR | Status: AC
  Filled 2012-03-21: qty 4

## 2012-03-21 MED ORDER — IOHEXOL 300 MG/ML  SOLN
25.0000 mL | Freq: Once | INTRAMUSCULAR | Status: AC | PRN
  Administered 2012-03-21: 25 mL

## 2012-03-21 MED ORDER — FENTANYL CITRATE 0.05 MG/ML IJ SOLN
INTRAMUSCULAR | Status: AC | PRN
  Administered 2012-03-21: 100 ug via INTRAVENOUS

## 2012-03-21 MED ORDER — MIDAZOLAM HCL 5 MG/5ML IJ SOLN
INTRAMUSCULAR | Status: AC | PRN
  Administered 2012-03-21: 2 mg via INTRAVENOUS

## 2012-03-21 MED ORDER — DEXTROSE 5 % IV SOLN
1.0000 g | INTRAVENOUS | Status: AC
  Administered 2012-03-31: 2 g via INTRAVENOUS

## 2012-03-21 MED ORDER — MIDAZOLAM HCL 2 MG/2ML IJ SOLN
INTRAMUSCULAR | Status: AC
  Filled 2012-03-21: qty 4

## 2012-03-21 NOTE — Discharge Instructions (Signed)
Nephrostomy  Care After Refer to this sheet in the next few weeks. These instructions provide you with information on caring for yourself after your procedure. Your caregiver may also give you specific instructions. Your treatment has been planned according to current medical practices, but problems sometimes occur. Call your caregiver if you have any problems or questions after your procedure. HOME CARE INSTRUCTIONS  Avoid taking aspirin or non-steroidal anti-inflammatory drugs (NSAIDs).   Only take over-the-counter or prescription medicines for pain, discomfort, or fever as directed by your caregiver.   Take your antibiotics as directed. Finish them even if you start to feel better.   Rest for the remainder of the day. Do not operate machinery, drive, or make legal decisions for 24 hours after your procedure.   Have someone drive you home.   You may resume your usual diet after the procedure. Drink extra fluids while the catheter is in place. Avoid alcoholic beverages for 24 hours after the procedure.   Keep the skin around your catheter dry.   You may shower. Cover the area with plastic wrap and tape the edges of the plastic wrap to your skin so your skin remains dry under the plastic. If the area does get wet, dry the skin completely.   Avoid baths or swimming.  SEEK MEDICAL CARE IF:   Redness, increased soreness, or swelling develops.   Your symptoms persist after several days or worsen.   If you seeblood in the urine (hematuria) for over 48 hours.  SEEK IMMEDIATE MEDICAL CARE IF:   Your flexible tube (catheter) accidentally gets pulled out.   You have chills or increased pain.   You have an oral temperature above 102 F (38.9 C), not controlled by medicine.   The skin breaks down around the catheter.   Your catheter stops draining.The catheter may be blocked.   There is leakage of urine around catheter.  Document Released: 08/05/2004 Document Revised: 12/02/2011  Document Reviewed: 02/11/2009 [facility] Patient Information 2012 [place], [place].

## 2012-03-21 NOTE — Procedures (Signed)
Successful placement of left sided 10 Fr PCN.  Urine sample sent to the lab.  No immediate complications.

## 2012-03-21 NOTE — H&P (Signed)
[name] [name] is an 41 y.o. female.   Chief Complaint: left kidney stone HPI: Patient with history of left nephrolithiasis/hydronephrosis presents today for left percutaneous nephrostomy in preparation for nephrolithotomy on 03/31/2012.  PMH: depression,eating disorder, pituitary adenoma, cystitis, prior suicide attempt, anxiety PSH: tubal ligation Social History:  does not have a smoking history on file. She does not have any smokeless tobacco history on file. Her alcohol and drug histories not on file. Family History: positive for hypertension, hypercholesterolemia,kidney/ovarian cancer Allergies: Allergies not on file  Current outpatient prescriptions:busPIRone (BUSPAR) 15 MG tablet, Take 15 mg by mouth 3 (three) times daily., Disp: , Rfl: ;  FLUoxetine (PROZAC) 40 MG capsule, Take 40 mg by mouth daily., Disp: , Rfl: ;  HYDROmorphone (DILAUDID) 4 MG tablet, Take 4 mg by mouth every 4 (four) hours as needed. FOR PAIN, Disp: , Rfl: ;  hydrOXYzine (ATARAX/VISTARIL) 50 MG tablet, Take 50 mg by mouth 3 (three) times daily as needed., Disp: , Rfl:  loperamide (IMODIUM) 2 MG capsule, Take 2 mg by mouth daily., Disp: , Rfl: ;  Multiple Vitamin (MULITIVITAMIN WITH MINERALS) TABS, Take 1 tablet by mouth daily., Disp: , Rfl: ;  risperiDONE (RISPERDAL) 1 MG tablet, Take 3 mg by mouth 2 (two) times daily., Disp: , Rfl: ;  traZODone (DESYREL) 100 MG tablet, Take 250-300 mg by mouth at bedtime., Disp: , Rfl: ;  trimethoprim (TRIMPEX) 100 MG tablet, Take 100 mg by mouth daily., Disp: , Rfl:  Current facility-administered medications:0.9 %  sodium chloride infusion, , Intravenous, Once, [employer], [place];  ciprofloxacin (CIPRO) 400 MG/200ML IVPB, , , , ;  ciprofloxacin (CIPRO) IVPB 400 mg, 400 mg, Intravenous, Once, [employer], [place];  fentaNYL (SUBLIMAZE) 0.05 MG/ML injection, , , , ;  midazolam (VERSED) 2 MG/2ML injection, , , ,    Results for orders placed during the hospital encounter of 03/21/12 (from the  past 48 hour(s))  CBC     Status: Normal   Collection Time   03/21/12 10:04 AM      Component Value Range Comment   WBC 7.0  4.0 - 10.5 (K/uL)    RBC 5.09  3.87 - 5.11 (MIL/uL)    Hemoglobin 14.2  12.0 - 15.0 (g/dL)    HCT 56.2  13.0 - 86.5 (%)    MCV 84.5  78.0 - 100.0 (fL)    MCH 27.9  26.0 - 34.0 (pg)    MCHC 33.0  30.0 - 36.0 (g/dL)    RDW 78.4  69.6 - 29.5 (%)    Platelets 359  150 - 400 (K/uL)    Results for orders placed during the hospital encounter of 03/21/12  APTT      Component Value Range   aPTT 32  24 - 37 (seconds)  CBC      Component Value Range   WBC 7.0  4.0 - 10.5 (K/uL)   RBC 5.09  3.87 - 5.11 (MIL/uL)   Hemoglobin 14.2  12.0 - 15.0 (g/dL)   HCT 28.4  13.2 - 44.0 (%)   MCV 84.5  78.0 - 100.0 (fL)   MCH 27.9  26.0 - 34.0 (pg)   MCHC 33.0  30.0 - 36.0 (g/dL)   RDW 10.2  72.5 - 36.6 (%)   Platelets 359  150 - 400 (K/uL)  PROTIME-INR      Component Value Range   Prothrombin Time 12.9  11.6 - 15.2 (seconds)   INR 0.95  0.00 - 1.49   BASIC METABOLIC PANEL  Component Value Range   Sodium 139  135 - 145 (mEq/L)   Potassium 4.1  3.5 - 5.1 (mEq/L)   Chloride 102  96 - 112 (mEq/L)   CO2 25  19 - 32 (mEq/L)   Glucose, Bld 114 (*) 70 - 99 (mg/dL)   BUN 15  6 - 23 (mg/dL)   Creatinine, Ser 1.61 (*) 0.50 - 1.10 (mg/dL)   Calcium 9.6  8.4 - 09.6 (mg/dL)   GFR calc non Af Amer 57 (*) >90 (mL/min)   GFR calc Af Amer 66 (*) >90 (mL/min)    Review of Systems  Constitutional: Positive for fever and chills.  Respiratory: Negative for cough and shortness of breath.   Cardiovascular: Negative for chest pain.  Gastrointestinal: Positive for nausea, vomiting and abdominal pain.  Genitourinary: Positive for flank pain. Negative for dysuria and hematuria.  Musculoskeletal: Positive for back pain.  Neurological: Negative for headaches.  Endo/Heme/Allergies: Does not bruise/bleed easily.    Blood pressure 119/73, pulse 87, temperature 98.8 F (37.1 C), resp. rate  17, SpO2 94.00%. Physical Exam  Constitutional: She is oriented to person, place, and time. She appears well-developed and well-nourished.  Cardiovascular: Normal rate and regular rhythm.   Respiratory: Effort normal and breath sounds normal.  GI: Soft. Bowel sounds are normal. There is tenderness.       LLQ tenderness  Musculoskeletal: Normal range of motion. She exhibits no edema.  Neurological: She is alert and oriented to person, place, and time.     Assessment/Plan Patient with left nephrolithiasis/hydronephrosis; plan is for left percutaneous nephrostomy today in preparation for left nephrolithotomy 03/31/2012. Details/risks of above d/w pt/husband with their understanding and consent.  [name] [name],D KEVIN 03/21/2012, 10:31 AM

## 2012-03-21 NOTE — Progress Notes (Signed)
Patient recovered in Endoscopy. VS on doc flowsheet. Dressing remains dry and intact. Blood tinged urine in drainage bag. Discharge instructions given and pt discharged via w/c at 1630.

## 2012-03-22 ENCOUNTER — Encounter (HOSPITAL_COMMUNITY): Payer: Self-pay | Admitting: Pharmacy Technician

## 2012-03-22 ENCOUNTER — Encounter (HOSPITAL_COMMUNITY): Payer: Self-pay

## 2012-03-22 ENCOUNTER — Encounter (HOSPITAL_COMMUNITY)
Admission: RE | Admit: 2012-03-22 | Discharge: 2012-03-22 | Disposition: A | Discharge status: Home / Self Care | Payer: 59 | Source: Ambulatory Visit | Attending: Urology | Admitting: Urology

## 2012-03-22 HISTORY — DX: Anemia, unspecified: D64.9

## 2012-03-22 HISTORY — DX: Anxiety disorder, unspecified: F41.9

## 2012-03-22 HISTORY — DX: Cardiac murmur, unspecified: R01.1

## 2012-03-22 HISTORY — DX: Depression, unspecified: F32.A

## 2012-03-22 LAB — SURGICAL PCR SCREEN
MRSA, PCR: NEGATIVE
Staphylococcus aureus: NEGATIVE

## 2012-03-22 LAB — URINE CULTURE
Colony Count: NO GROWTH
Culture  Setup Time: 201303261610
Culture: NO GROWTH
Special Requests: NORMAL

## 2012-03-22 NOTE — Patient Instructions (Signed)
20 [name] [name]  03/22/2012   Your procedure is scheduled on:  03/31/12 1025am-1225pm  Report to [facility] at 0800 AM.  Call this number if you have problems the morning of surgery: [phone]   Remember:   Do not eat food:After Midnight.  May have clear liquids:until Midnight .    Take these medicines the morning of surgery with A SIP OF WATER:   Do not wear jewelry, make-up or nail polish.  Do not wear lotions, powders, or perfumes.  Do not shave 48 hours prior to surgery.  Do not bring valuables to the hospital.  Contacts, dentures or bridgework may not be worn into surgery.  Leave suitcase in the car. After surgery it may be brought to your room.  For patients admitted to the hospital, checkout time is 11:00 AM the day of discharge.      Special Instructions: CHG Shower Use Special Wash: 1/2 bottle night before surgery and 1/2 bottle morning of surgery. shower chin to toes with CHG.  Wash face and private parts with regular soap.    Please read over the following fact sheets that you were given: MRSA Information, coughing and deep breathing exercises, leg exercises

## 2012-03-27 DIAGNOSIS — F333 Major depressive disorder, recurrent, severe with psychotic symptoms: Secondary | ICD-10-CM | POA: Diagnosis not present

## 2012-03-30 ENCOUNTER — Other Ambulatory Visit: Payer: Self-pay | Admitting: Urology

## 2012-03-30 NOTE — H&P (Signed)
History of Present Illness     Left nephrolithiasis: She was noted by CT scan to have several small left renal calculi as well as a 9 x 12 mm stone located at the UPJ on the left-hand side. This was noted to be associated with significant hydronephrosis and marked thinning of the renal parenchyma indicating chronic, long-standing obstruction. The stone has Hounsfield units of approximately 1400. She was noted to have normal renal function with a creatinine of 0.98    Chronic cystitis:In the last several months she has had 2 and probably 3 urinary tract infections with at least 2 positive for Klebsiella.   LUTS: She has reported significant frequency with some nocturia.  Interval history: The patient comes in today stating she been having some nausea and vomiting as well as chills. She reports pain in her left lower quadrant but not in her flank. She has pain in her lower back on both sides. She's also developed diarrhea. She taking trimethoprim. She said the pain has been present for several months and is unchanged. She has now however developed some pain in the left side when she lays on her left side. Her pain is moderate to severe.   Past Medical History Problems  1. History of  Depression 311 2. History of  Eating Disorder 307.50 3. History of  Headache 784.0 4. History of  Murmurs 785.2 5. 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 History of  Reported Positive Pap Smear 795.19 7. History of  Suicide Attempt By Drugs E950.5  Surgical History Problems  1. History of  Tubal Ligation V25.2  Current Meds 1. BusPIRone HCl 15 MG Oral Tablet; Therapy: (Recorded:25Feb2013) to 2. FLUoxetine HCl 40 MG Oral Capsule; Therapy: (Recorded:25Feb2013) to 3. Hydrocodone-Acetaminophen 10-325 MG Oral Tablet; TAKE 1-2 TABLETS EVERY 6 HOURS AS  NEEDED; Therapy: 21Mar2013 to (Complete:27Mar2013); Last Rx:21Mar2013 4. Imodium Advanced 2-125 MG Oral Tablet Chewable;  Therapy: (Recorded:25Feb2013) to 5. Meloxicam 7.5 MG Oral Tablet; Therapy: (Recorded:25Feb2013) to 6. RisperDAL 3 MG Oral Tablet; Therapy: 10Jan2013 to 7. TraZODone HCl 100 MG Oral Tablet; Therapy: (Recorded:25Feb2013) to 8. Trimethoprim 100 MG Oral Tablet; TAKE 100 MG Daily; Therapy: 19Mar2013 to  (Evaluate:14Mar2014)  Requested for: 20Mar2013; Last Rx:19Mar2013 9. Vistaril 50 MG Oral Capsule; Therapy: (Recorded:25Feb2013) to  Allergies Medication  1. Augmentin TABS 2. Avelox TABS 3. Sodium Hydroxide 1.0 N SOLN  Family History Problems  1. Family history of  Family Health Status Number Of Children 1 son & 1 daughter 2. Maternal history of  Hematuria 3. Maternal history of  Hypercholesterolemia 4. Maternal history of  Hypertension V17.49 5. Maternal history of  Kidney Cancer V16.51 6. Maternal history of  Ovarian Cancer V16.41  Social History Problems  1. Caffeine Use 2. Never A Smoker 3. Occupation: Homemaker 4. Physical Disability Affecting Ability To Work Denied  5. History of  Alcohol Use  Review of Systems Skin, eye, otolaryngeal, hematologic/lymphatic, pulmonary, endocrine, musculoskeletal and neurological system(s) were reviewed and pertinent findings if present are noted.  Genitourinary: urinary frequency.  Gastrointestinal: abdominal pain, heartburn and diarrhea.  Constitutional: night sweats and feeling tired (fatigue).  Cardiovascular: chest pain and leg swelling.  Psychiatric: depression and anxiety.    Vitals Vital Signs BMI Calculated: 33.12 BSA Calculated: 1.93 Height: 5 ft 4 in Weight: 194 lb  Blood Pressure: 117 / 80 Temperature: 97.7 F Heart Rate: 80  Physical Exam Constitutional: Well nourished and well developed . No acute distress.  ENT:. The ears and nose are  normal in appearance.  Neck: The appearance of the neck is normal and no neck mass is present.  Pulmonary: No respiratory distress and normal respiratory rhythm and effort.    Cardiovascular: Heart rate and rhythm are normal . No peripheral edema.  Abdomen: The abdomen is soft and nontender. No masses are palpated. No CVA tenderness. No hernias are palpable. No hepatosplenomegaly noted.  Lymphatics: The femoral and inguinal nodes are not enlarged or tender.  Skin: Normal skin turgor, no visible rash and no visible skin lesions.  Neuro/Psych:. Mood and affect are appropriate.   . Genitourinary: On pelvic examination Ms. Nealy has a high grade 2 asymptomatic cystocele.   Assessment Assessed  1. Proximal Ureteral Stone On The Left 592.1 2. Nephrolithiasis Of The Left Kidney 592.0 3. Renal Atrophy Left 587   I discussed with the patient the fact that the pain that she is having is not characteristic of an obstructed kidney especially since it appears that your kidney has been obstructed for many months. She said she has been having pain for many months so her obstructed kidney could potentially be was causing her discomfort although she also has been having diarrhea which she said she's also been having for several months. Although she had had chills as well as nausea and vomiting she was found to be afebrile today. I am going to check a CBC and BMP to evaluate for elevated white count and in order to establish a baseline creatinine. I originally had considered obtaining a renogram before the patient came in when I reviewed her CT scan but now that she indicates she is having pain and has had this pain for some time I think an obstructing her kidney is necessary. That would even be if there was minimal function of the kidney.  I therefore have discussed with the patient the placement of a nephrostomy tube into her left kidney for drainage and to obtain urine for culture. She's been on trimethoprim and her urine when she was here at her last visit was completely clear. We then discussed the procedure of percutaneous nephrostolithotomy. I went over the procedure with her in  detail and drew pictures. We went over the potential risks and complications completely. She understands the possible need for nephrostomy tube after the procedure is well as a stent. She will remain in the hospital overnight.   Plan    1. Obtain CBC and BMP today. 2. Continue trimethoprim. 3. She will leave a urine specimen for me today before she leaves. 4. She will be scheduled for a left percutaneous nephrostomy tube to be placed in her urine will be cultured. 5. She will then be scheduled for a left percutaneous nephrostolithotomy.  6. I have prescribed stronger pain medication and an anti-emetic.

## 2012-03-30 NOTE — Discharge Instructions (Signed)

## 2012-03-30 NOTE — Pre-Procedure Instructions (Signed)
03/30/12 Orders are in EPIC for 03/31/12.

## 2012-03-30 NOTE — Pre-Procedure Instructions (Signed)
03/29/12 Callled and left message with surgery scheduler of MD that orders are no longer in EPIC for day of surgery .  I asked that orders be reentered.

## 2012-03-31 ENCOUNTER — Ambulatory Visit (HOSPITAL_COMMUNITY)
Admission: RE | Admit: 2012-03-31 | Discharge: 2012-04-01 | Disposition: A | Discharge status: Home / Self Care | Payer: 59 | Source: Ambulatory Visit | Attending: Urology | Admitting: Urology

## 2012-03-31 ENCOUNTER — Ambulatory Visit (HOSPITAL_COMMUNITY): Payer: 59 | Admitting: *Deleted

## 2012-03-31 ENCOUNTER — Ambulatory Visit (HOSPITAL_COMMUNITY): Payer: 59

## 2012-03-31 ENCOUNTER — Encounter (HOSPITAL_COMMUNITY): Payer: Self-pay | Admitting: *Deleted

## 2012-03-31 ENCOUNTER — Encounter (HOSPITAL_COMMUNITY)
Admission: RE | Disposition: A | Discharge status: Home / Self Care | Payer: Self-pay | Source: Ambulatory Visit | Attending: Urology

## 2012-03-31 DIAGNOSIS — N200 Calculus of kidney: Secondary | ICD-10-CM

## 2012-03-31 DIAGNOSIS — N1330 Unspecified hydronephrosis: Secondary | ICD-10-CM

## 2012-03-31 DIAGNOSIS — N211 Calculus in urethra: Secondary | ICD-10-CM | POA: Diagnosis not present

## 2012-03-31 DIAGNOSIS — R011 Cardiac murmur, unspecified: Secondary | ICD-10-CM | POA: Insufficient documentation

## 2012-03-31 DIAGNOSIS — E220 Acromegaly and pituitary gigantism: Secondary | ICD-10-CM | POA: Insufficient documentation

## 2012-03-31 DIAGNOSIS — Z79899 Other long term (current) drug therapy: Secondary | ICD-10-CM | POA: Insufficient documentation

## 2012-03-31 DIAGNOSIS — N201 Calculus of ureter: Secondary | ICD-10-CM | POA: Insufficient documentation

## 2012-03-31 DIAGNOSIS — N269 Renal sclerosis, unspecified: Secondary | ICD-10-CM | POA: Insufficient documentation

## 2012-03-31 HISTORY — PX: NEPHROLITHOTOMY: SHX5134

## 2012-03-31 SURGERY — NEPHROLITHOTOMY PERCUTANEOUS
Anesthesia: General | Site: Ureter | Laterality: Left | Wound class: Clean Contaminated

## 2012-03-31 MED ORDER — CEFAZOLIN SODIUM 1-5 GM-% IV SOLN
INTRAVENOUS | Status: AC
  Filled 2012-03-31: qty 100

## 2012-03-31 MED ORDER — ROCURONIUM BROMIDE 100 MG/10ML IV SOLN
INTRAVENOUS | Status: DC | PRN
  Administered 2012-03-31: 50 mg via INTRAVENOUS
  Administered 2012-03-31: 10 mg via INTRAVENOUS

## 2012-03-31 MED ORDER — PROPOFOL 10 MG/ML IV BOLUS
INTRAVENOUS | Status: DC | PRN
  Administered 2012-03-31: 180 mg via INTRAVENOUS

## 2012-03-31 MED ORDER — MEPERIDINE HCL 50 MG/ML IJ SOLN: 6.2500 mg | INTRAMUSCULAR | Status: DC | PRN

## 2012-03-31 MED ORDER — TRAZODONE HCL 150 MG PO TABS
300.0000 mg | ORAL_TABLET | Freq: Every day | ORAL | Status: DC
Start: 2012-04-01 — End: 2012-04-01
  Filled 2012-03-31: qty 2

## 2012-03-31 MED ORDER — ACETAMINOPHEN 10 MG/ML IV SOLN
INTRAVENOUS | Status: AC
  Filled 2012-03-31: qty 100

## 2012-03-31 MED ORDER — HYOSCYAMINE SULFATE 0.125 MG SL SUBL
0.1250 mg | SUBLINGUAL_TABLET | SUBLINGUAL | Status: DC | PRN
  Administered 2012-03-31: 0.125 mg via ORAL
  Filled 2012-03-31 (×2): qty 1

## 2012-03-31 MED ORDER — LACTATED RINGERS IV SOLN: INTRAVENOUS | Status: DC

## 2012-03-31 MED ORDER — BUSPIRONE HCL 15 MG PO TABS
15.0000 mg | ORAL_TABLET | Freq: Three times a day (TID) | ORAL | Status: DC
  Administered 2012-04-01: 15 mg via ORAL
  Filled 2012-03-31 (×4): qty 1

## 2012-03-31 MED ORDER — ONDANSETRON HCL 4 MG/2ML IJ SOLN: 4.0000 mg | INTRAMUSCULAR | Status: DC | PRN

## 2012-03-31 MED ORDER — PROMETHAZINE HCL 25 MG/ML IJ SOLN: 6.2500 mg | INTRAMUSCULAR | Status: DC | PRN

## 2012-03-31 MED ORDER — NEOSTIGMINE METHYLSULFATE 1 MG/ML IJ SOLN
INTRAMUSCULAR | Status: DC | PRN
  Administered 2012-03-31: 4 mg via INTRAVENOUS

## 2012-03-31 MED ORDER — CEFAZOLIN SODIUM 1-5 GM-% IV SOLN
1.0000 g | Freq: Three times a day (TID) | INTRAVENOUS | Status: AC
  Administered 2012-03-31 – 2012-04-01 (×2): 1 g via INTRAVENOUS
  Filled 2012-03-31 (×2): qty 50

## 2012-03-31 MED ORDER — CEFAZOLIN SODIUM 1-5 GM-% IV SOLN: 1.0000 g | INTRAVENOUS | Status: DC

## 2012-03-31 MED ORDER — GLYCOPYRROLATE 0.2 MG/ML IJ SOLN
INTRAMUSCULAR | Status: DC | PRN
  Administered 2012-03-31: .6 mg via INTRAVENOUS

## 2012-03-31 MED ORDER — HYDROMORPHONE HCL PF 1 MG/ML IJ SOLN: 0.5000 mg | INTRAMUSCULAR | Status: DC | PRN

## 2012-03-31 MED ORDER — RISPERIDONE 3 MG PO TABS
3.0000 mg | ORAL_TABLET | Freq: Two times a day (BID) | ORAL | Status: DC
  Administered 2012-04-01: 3 mg via ORAL
  Filled 2012-03-31 (×3): qty 1

## 2012-03-31 MED ORDER — SODIUM CHLORIDE 0.9 % IV SOLN
INTRAVENOUS | Status: DC
  Administered 2012-03-31 – 2012-04-01 (×2): via INTRAVENOUS

## 2012-03-31 MED ORDER — SODIUM CHLORIDE 0.9 % IR SOLN
Status: DC | PRN
  Administered 2012-03-31 (×2): 3000 mL

## 2012-03-31 MED ORDER — ONDANSETRON HCL 4 MG/2ML IJ SOLN
INTRAMUSCULAR | Status: DC | PRN
  Administered 2012-03-31: 4 mg via INTRAVENOUS

## 2012-03-31 MED ORDER — OXYCODONE-ACETAMINOPHEN 5-325 MG PO TABS
1.0000 | ORAL_TABLET | ORAL | Status: DC | PRN
  Administered 2012-03-31: 1 via ORAL
  Administered 2012-03-31: 2 via ORAL
  Filled 2012-03-31 (×2): qty 2

## 2012-03-31 MED ORDER — HYDROMORPHONE HCL PF 1 MG/ML IJ SOLN
INTRAMUSCULAR | Status: AC
  Filled 2012-03-31: qty 1

## 2012-03-31 MED ORDER — HEMOSTATIC AGENTS (NO CHARGE) OPTIME
TOPICAL | Status: DC | PRN
  Administered 2012-03-31: 1 via TOPICAL

## 2012-03-31 MED ORDER — ZOLPIDEM TARTRATE 5 MG PO TABS
5.0000 mg | ORAL_TABLET | Freq: Every evening | ORAL | Status: DC | PRN
  Administered 2012-04-01: 5 mg via ORAL
  Filled 2012-03-31: qty 1

## 2012-03-31 MED ORDER — LIDOCAINE HCL (CARDIAC) 20 MG/ML IV SOLN
INTRAVENOUS | Status: DC | PRN
  Administered 2012-03-31: 100 mg via INTRAVENOUS

## 2012-03-31 MED ORDER — HYDROXYZINE HCL 50 MG PO TABS
50.0000 mg | ORAL_TABLET | Freq: Three times a day (TID) | ORAL | Status: DC
  Administered 2012-04-01: 50 mg via ORAL
  Filled 2012-03-31 (×3): qty 1

## 2012-03-31 MED ORDER — FENTANYL CITRATE 0.05 MG/ML IJ SOLN
INTRAMUSCULAR | Status: DC | PRN
  Administered 2012-03-31: 100 ug via INTRAVENOUS
  Administered 2012-03-31 (×3): 50 ug via INTRAVENOUS

## 2012-03-31 MED ORDER — MIDAZOLAM HCL 5 MG/5ML IJ SOLN
INTRAMUSCULAR | Status: DC | PRN
  Administered 2012-03-31: 2 mg via INTRAVENOUS

## 2012-03-31 MED ORDER — IOHEXOL 300 MG/ML  SOLN
INTRAMUSCULAR | Status: AC
  Filled 2012-03-31: qty 2

## 2012-03-31 MED ORDER — IOHEXOL 300 MG/ML  SOLN
INTRAMUSCULAR | Status: DC | PRN
  Administered 2012-03-31: 20 mL via INTRAVENOUS

## 2012-03-31 MED ORDER — HYDROMORPHONE HCL PF 1 MG/ML IJ SOLN
0.2500 mg | INTRAMUSCULAR | Status: DC | PRN
  Administered 2012-03-31: 0.5 mg via INTRAVENOUS

## 2012-03-31 MED ORDER — LACTATED RINGERS IV SOLN
INTRAVENOUS | Status: DC | PRN
  Administered 2012-03-31 (×2): via INTRAVENOUS

## 2012-03-31 MED ORDER — ACETAMINOPHEN 10 MG/ML IV SOLN
INTRAVENOUS | Status: DC | PRN
  Administered 2012-03-31: 1000 mg via INTRAVENOUS

## 2012-03-31 SURGICAL SUPPLY — 58 items
ADH SKN CLS APL DERMABOND .7 (GAUZE/BANDAGES/DRESSINGS) ×2
APL ESCP 34 STRL LF DISP (HEMOSTASIS) ×2
APPLICATOR SURGIFLO ENDO (HEMOSTASIS) ×4 IMPLANT
BAG URINE DRAINAGE (UROLOGICAL SUPPLIES) ×4 IMPLANT
BASKET ZERO TIP NITINOL 2.4FR (BASKET) ×4 IMPLANT
BENZOIN TINCTURE PRP APPL 2/3 (GAUZE/BANDAGES/DRESSINGS) ×8 IMPLANT
BLADE SURG 15 STRL LF DISP TIS (BLADE) ×1 IMPLANT
BLADE SURG 15 STRL SS (BLADE) ×2
BSKT STON RTRVL ZERO TP 2.4FR (BASKET) ×2
CATH FOLEY 2W COUNCIL 20FR 5CC (CATHETERS) IMPLANT
CATH FOLEY 2WAY SLVR  5CC 18FR (CATHETERS) ×1
CATH FOLEY 2WAY SLVR 5CC 18FR (CATHETERS) ×1 IMPLANT
CATH ROBINSON RED A/P 20FR (CATHETERS) IMPLANT
CATH X-FORCE N30 NEPHROSTOMY (TUBING) ×2 IMPLANT
CLOTH BEACON ORANGE TIMEOUT ST (SAFETY) ×2 IMPLANT
COVER SURGICAL LIGHT HANDLE (MISCELLANEOUS) ×4 IMPLANT
DERMABOND ADVANCED (GAUZE/BANDAGES/DRESSINGS) ×2
DERMABOND ADVANCED .7 DNX12 (GAUZE/BANDAGES/DRESSINGS) ×2 IMPLANT
DRAPE C-ARM 42X72 X-RAY (DRAPES) ×2 IMPLANT
DRAPE CAMERA CLOSED 9X96 (DRAPES) ×2 IMPLANT
DRAPE LINGEMAN PERC (DRAPES) ×2 IMPLANT
DRAPE SURG IRRIG POUCH 19X23 (DRAPES) ×2 IMPLANT
DRSG PAD ABDOMINAL 8X10 ST (GAUZE/BANDAGES/DRESSINGS) ×4 IMPLANT
DRSG TEGADERM 4X4.75 (GAUZE/BANDAGES/DRESSINGS) ×2 IMPLANT
DRSG TEGADERM 8X12 (GAUZE/BANDAGES/DRESSINGS) ×4 IMPLANT
FLOSEAL 10ML (HEMOSTASIS) ×2 IMPLANT
GAUZE SPONGE 4X4 16PLY XRAY LF (GAUZE/BANDAGES/DRESSINGS) ×2 IMPLANT
GLIDEWIRE 0.025 SS STRAIGHT (WIRE) ×4 IMPLANT
GLOVE BIOGEL M 8.0 STRL (GLOVE) ×4 IMPLANT
GOWN STRL REIN XL XLG (GOWN DISPOSABLE) ×2 IMPLANT
GUIDEWIRE ANG ZIPWIRE 038X150 (WIRE) ×2 IMPLANT
GUIDEWIRE STR DUAL SENSOR (WIRE) ×4 IMPLANT
HOLDER FOLEY CATH W/STRAP (MISCELLANEOUS) ×4 IMPLANT
JUMPSUIT BLUE BOOT COVER DISP (PROTECTIVE WEAR) ×6 IMPLANT
KIT BASIN OR (CUSTOM PROCEDURE TRAY) ×2 IMPLANT
LASER FIBER DISP (UROLOGICAL SUPPLIES) IMPLANT
LASER FIBER DISP 1000U (UROLOGICAL SUPPLIES) IMPLANT
MANIFOLD NEPTUNE II (INSTRUMENTS) ×2 IMPLANT
NS IRRIG 1000ML POUR BTL (IV SOLUTION) ×2 IMPLANT
PACK BASIC VI WITH GOWN DISP (CUSTOM PROCEDURE TRAY) ×2 IMPLANT
PROBE LITHOCLAST ULTRA 3.8X403 (UROLOGICAL SUPPLIES) IMPLANT
PROBE PNEUMATIC 1.0MMX570MM (UROLOGICAL SUPPLIES) IMPLANT
SET IRRIG Y TYPE TUR BLADDER L (SET/KITS/TRAYS/PACK) ×4 IMPLANT
SET WARMING FLUID IRRIGATION (MISCELLANEOUS) ×2 IMPLANT
SPONGE GAUZE 4X4 12PLY (GAUZE/BANDAGES/DRESSINGS) ×2 IMPLANT
SPONGE LAP 4X18 X RAY DECT (DISPOSABLE) ×2 IMPLANT
STENT CONTOUR URETERAL (STENTS) ×2 IMPLANT
STONE CATCHER W/TUBE ADAPTER (UROLOGICAL SUPPLIES) IMPLANT
SUT MNCRL AB 4-0 PS2 18 (SUTURE) ×2 IMPLANT
SUT SILK 2 0 30  PSL (SUTURE) ×1
SUT SILK 2 0 30 PSL (SUTURE) ×1 IMPLANT
SYR 20CC LL (SYRINGE) ×4 IMPLANT
SYRINGE 10CC LL (SYRINGE) ×2 IMPLANT
TAPE CLOTH SURG 4X10 WHT LF (GAUZE/BANDAGES/DRESSINGS) ×2 IMPLANT
TOWEL OR NON WOVEN STRL DISP B (DISPOSABLE) ×2 IMPLANT
TRAY FOLEY CATH 14FRSI W/METER (CATHETERS) ×2 IMPLANT
TRAY FOLEY CATH 16FRSI W/METER (SET/KITS/TRAYS/PACK) ×2 IMPLANT
TUBING CONNECTING 10 (TUBING) ×6 IMPLANT

## 2012-03-31 NOTE — Anesthesia Preprocedure Evaluation (Signed)
Anesthesia Evaluation  Patient identified by MRN, date of birth, ID band Patient awake    Reviewed: Allergy & Precautions, H&P , NPO status , Patient's Chart, lab work & pertinent test results  Airway Mallampati: II TM Distance: >3 FB Neck ROM: full    Dental No notable dental hx.    Pulmonary neg pulmonary ROS,  breath sounds clear to auscultation  Pulmonary exam normal       Cardiovascular Exercise Tolerance: Good negative cardio ROS  Rhythm:regular Rate:Normal     Neuro/Psych negative neurological ROS  negative psych ROS   GI/Hepatic negative GI ROS, Neg liver ROS,   Endo/Other  negative endocrine ROS  Renal/GU negative Renal ROS  negative genitourinary   Musculoskeletal   Abdominal   Peds  Hematology negative hematology ROS (+)   Anesthesia Other Findings   Reproductive/Obstetrics negative OB ROS                           Anesthesia Physical Anesthesia Plan  ASA: II  Anesthesia Plan: General   Post-op Pain Management:    Induction:   Airway Management Planned:   Additional Equipment:   Intra-op Plan:   Post-operative Plan:   Informed Consent: I have reviewed the patients History and Physical, chart, labs and discussed the procedure including the risks, benefits and alternatives for the proposed anesthesia with the patient or authorized representative who has indicated his/her understanding and acceptance.   Dental Advisory Given  Plan Discussed with: CRNA  Anesthesia Plan Comments:         Anesthesia Quick Evaluation  

## 2012-03-31 NOTE — Interval H&P Note (Signed)
History and Physical Interval Note:  03/31/2012 9:31 AM  [name] [name]  has presented today for surgery, with the diagnosis of Left Ureteral Pelvic Junction Stone and Hydronephrosis  The various methods of treatment have been discussed with the patient and family. After consideration of risks, benefits and other options for treatment, the patient has consented to  Procedure(s) (LRB): NEPHROLITHOTOMY PERCUTANEOUS (Left) as a surgical intervention .  The patients' history has been reviewed, patient examined, no change in status, stable for surgery.  I have reviewed the patients' chart and labs.  Questions were answered to the patient's satisfaction.     [place]

## 2012-03-31 NOTE — Anesthesia Postprocedure Evaluation (Signed)
  Anesthesia Post-op Note  Patient: [name] [name]  Procedure(s) Performed: Procedure(s) (LRB): NEPHROLITHOTOMY PERCUTANEOUS (Left)  Patient Location: PACU  Anesthesia Type: General  Level of Consciousness: awake and alert   Airway and Oxygen Therapy: Patient Spontanous Breathing  Post-op Pain: mild  Post-op Assessment: Post-op Vital signs reviewed, Patient's Cardiovascular Status Stable, Respiratory Function Stable, Patent Airway and No signs of Nausea or vomiting  Post-op Vital Signs: stable  Complications: No apparent anesthesia complications

## 2012-03-31 NOTE — Transfer of Care (Signed)
Immediate Anesthesia Transfer of Care Note  Patient: [name] [name]  Procedure(s) Performed: Procedure(s) (LRB): NEPHROLITHOTOMY PERCUTANEOUS (Left)  Patient Location: PACU  Anesthesia Type: General  Level of Consciousness: awake, alert  and oriented  Airway & Oxygen Therapy: Patient Spontanous Breathing and Patient connected to face mask oxygen  Post-op Assessment: Report given to PACU RN and Post -op Vital signs reviewed and stable  Post vital signs: Reviewed and stable  Complications: No apparent anesthesia complications

## 2012-03-31 NOTE — Op Note (Signed)
PATIENT:  [name] [name]  PRE-OPERATIVE DIAGNOSIS:  1. Impacted left UPJ stone. 2. Renal calculi.  POST-OPERATIVE DIAGNOSIS:  Same  PROCEDURE:  Procedure(s): 1. Antegrade nephrostogram with interpretation. 2. Left percutaneous nephrolithotomy (12 mm) 3. Left antegrade double-J stent placement  SURGEON:  Garnett Farm  INDICATION: [name] [name] is a 41 year old female who was found to have a 9 x 12 mm stone located at the left UPJ with associated marked hydronephrosis and thinning of the renal parenchyma indicating chronic, long-standing obstruction. She had normal renal function with a creatinine of 0.98 and had developed nausea, vomiting and chills as well as pain in her left lower quadrant. Due to the presence of a prior history of chronic cystitis I checked a CBC and found it was not elevated. I started her on empiric antibiotics and had a percutaneous nephrostomy tube placed to decompress the system and obtained urine for culture to be sure she did not have infection behind her stone. The culture from her kidney proved negative. She is brought to the operating room for treatment of her UPJ stone as well as other renal calculi.  ANESTHESIA:  General  EBL:  Minimal  DRAINS: 6 French, 26 cm double-J stent in the left ureter.  LOCAL MEDICATIONS USED:  None  SPECIMEN:  Stone  DISPOSITION OF SPECIMEN:  Given to patient.  Description of procedure: After informed consent the patient was brought to the major OR and administered general anesthesia on her stretcher. She was then moved from the stretcher to the operating room table in the prone position. Her previously placed nephrostomy tube was noted to be exiting the left flank region. This as well as the left flank were then sterilely prepped and draped. An official timeout was then performed.  I initially passed a 0.038 inch floppy-tipped guidewire through the nephrostomy catheter and into the area of the renal pelvis. I then removed the  nephrostomy catheter and passed a comfy catheter over the guidewire.  I injected full-strength contrast through the catheter and noted it was then the kidney in the area of the renal pelvis. As I was doing this the guidewire and catheter were noted to have formed a loop in the retroperitoneal tissue and this resulted in the catheter and guidewire becoming dislodged from the kidney. I contacted Dr. Valentina Gu who came to the operating room and assisted by manipulating both the catheter and guidewire and was able to gain access through the previous nephrostomy tract and into the kidney. He made several attempts to pass a guidewire down the ureter was unable to pass the catheter over the guidewire and down the ureter as well. We therefore elected to place a working guidewire and a safety guidewire and curled them in the very capacious renal pelvis and upper pole. A single guidewire was passed into the upper pole and the coaxial catheter was passed over the guidewire and into the area the renal pelvis. The inner portion of the coaxial catheter was then removed and a second guidewire was placed in the upper pole of the kidney as well. This was removed and a transverse skin incision was then made. The dilating balloon was then passed over the guidewire and inflated in the area of the pelvis. A 30 French ureteral access sheath was then passed over the nephrostomy balloon and into the renal pelvis and the balloon deflated and removed.  The 48 French rigid nephroscope was then passed through the access sheath and into the area the renal pelvis. I  was able to easily identify the stone lodged in the ureteropelvic junction and grasped with 3 prong graspers and extracted this easily. Re-evaluation revealed a second smaller stone fragment in that location as well and so I passed a Nitinol basket through the scope and was able to grasp the stone and extract it as well. I then elected to evaluate the remainder of the kidney  with the flexible cystoscope.  The 17 French flexible cystoscope was passed through the access sheath and into the renal pelvis. As directed into the upper pole and found no stones there. I was able to retroflex the scope and could visualize the area of the middle pole calyx but was unable to visualize any stone in this location. I was unable to visualize the entire renal papilla region. I then inspected the lower pole calyx and found another small stone which was grasped and extracted as well with the Nitinol basket.  Further inspection of the renal pelvis revealed it was completely intact and therefore I passed the flexible cystoscope through the UPJ and into the proximal ureter. A guidewire was passed through the cystoscope and down the ureter under direct fluoroscopic control. This was left in the area of the bladder and the flexible cystoscope was removed. The rigid nephroscope was then backloaded over the guidewire and a stent was passed over the guidewire down into the bladder. As I removed the guidewire good curl was noted in the bladder and also in the area the renal pelvis. There was no significant bleeding during the case and I elected to perform this without the placement of a nephrostomy tube. I therefore backed the scope to the level of the renal parenchyma and measured this distance. I then used FloSeal and passed this through the access sheath to the previously measured distance and injected FloSeal as I removed the nephrostomy sheath filling the tract with FloSeal. I then closed the skin with a running subcuticular 4-0 Monocryl suture. The incision was sealed with Dermabond and a pressure dressing was applied. The patient was awakened and taken to the recovery room in stable and satisfactory condition. She tolerated the procedure well with no intraoperative complications.  PLAN OF CARE: She will be observed overnight with planned discharge in the morning.  PATIENT DISPOSITION:  PACU -  hemodynamically stable.

## 2012-04-01 NOTE — Progress Notes (Signed)
  Subjective: Patient reports: no pain or discomfort except in the left flank when she moves around.  Objective: Vital signs in last 24 hours: Temp:  [97.2 F (36.2 C)-98.5 F (36.9 C)] 97.8 F (36.6 C) (04/06 0615) Pulse Rate:  [58-100] 59  (04/06 0615) Resp:  [13-20] 17  (04/06 0615) BP: (104-139)/(60-79) 118/75 mmHg (04/06 0615) SpO2:  [93 %-100 %] 93 % (04/06 0615) Weight:  [199 lb 1.6 oz (90.311 kg)] 199 lb 1.6 oz (90.311 kg) (04/05 1330)  Intake/Output from previous day: 04/05 0701 - 04/06 0700 In: 3461.7 [P.O.:360; I.V.:3001.7; IV Piggyback:100] Out: 2275 [Urine:2275] Intake/Output this shift:  Alert and oriented.  Physical Exam:  Lungs - Normal respiratory effort, chest expands symmetrically.  Abdomen - Soft, non-tender & non-distended Foley draining clear urine.  Wound clean and dry.  Assessment/Plan:Doing well. Discharge home today.  Follow-up with Dr Vernie Ammons  LOS: 1 day   Carleton Vanvalkenburgh-HENRY 04/01/2012, 7:36 AM

## 2012-04-07 DIAGNOSIS — N200 Calculus of kidney: Secondary | ICD-10-CM | POA: Diagnosis not present

## 2012-04-10 DIAGNOSIS — F333 Major depressive disorder, recurrent, severe with psychotic symptoms: Secondary | ICD-10-CM | POA: Diagnosis not present

## 2012-04-14 ENCOUNTER — Encounter (HOSPITAL_COMMUNITY): Payer: Self-pay | Admitting: Urology

## 2012-04-17 DIAGNOSIS — R1011 Right upper quadrant pain: Secondary | ICD-10-CM | POA: Diagnosis not present

## 2012-04-17 DIAGNOSIS — R197 Diarrhea, unspecified: Secondary | ICD-10-CM | POA: Diagnosis not present

## 2012-04-17 DIAGNOSIS — R350 Frequency of micturition: Secondary | ICD-10-CM | POA: Diagnosis not present

## 2012-04-18 ENCOUNTER — Other Ambulatory Visit: Payer: Self-pay | Admitting: Family Medicine

## 2012-04-18 DIAGNOSIS — R1011 Right upper quadrant pain: Secondary | ICD-10-CM

## 2012-04-18 DIAGNOSIS — F333 Major depressive disorder, recurrent, severe with psychotic symptoms: Secondary | ICD-10-CM | POA: Diagnosis not present

## 2012-04-20 ENCOUNTER — Ambulatory Visit
Admission: RE | Admit: 2012-04-20 | Discharge: 2012-04-20 | Disposition: A | Discharge status: Home / Self Care | Payer: 59 | Source: Ambulatory Visit | Attending: Family Medicine | Admitting: Family Medicine

## 2012-04-20 ENCOUNTER — Other Ambulatory Visit: Payer: Self-pay | Admitting: Family Medicine

## 2012-04-20 DIAGNOSIS — R1011 Right upper quadrant pain: Secondary | ICD-10-CM

## 2012-04-25 DIAGNOSIS — F333 Major depressive disorder, recurrent, severe with psychotic symptoms: Secondary | ICD-10-CM | POA: Diagnosis not present

## 2012-04-26 DIAGNOSIS — F259 Schizoaffective disorder, unspecified: Secondary | ICD-10-CM | POA: Diagnosis not present

## 2012-04-27 DIAGNOSIS — F333 Major depressive disorder, recurrent, severe with psychotic symptoms: Secondary | ICD-10-CM | POA: Diagnosis not present

## 2012-05-01 DIAGNOSIS — F333 Major depressive disorder, recurrent, severe with psychotic symptoms: Secondary | ICD-10-CM | POA: Diagnosis not present

## 2012-05-08 DIAGNOSIS — F333 Major depressive disorder, recurrent, severe with psychotic symptoms: Secondary | ICD-10-CM | POA: Diagnosis not present

## 2012-05-09 DIAGNOSIS — F411 Generalized anxiety disorder: Secondary | ICD-10-CM | POA: Diagnosis not present

## 2012-05-09 DIAGNOSIS — F314 Bipolar disorder, current episode depressed, severe, without psychotic features: Secondary | ICD-10-CM | POA: Diagnosis not present

## 2012-05-09 DIAGNOSIS — T50901A Poisoning by unspecified drugs, medicaments and biological substances, accidental (unintentional), initial encounter: Secondary | ICD-10-CM | POA: Diagnosis not present

## 2012-05-12 DIAGNOSIS — F332 Major depressive disorder, recurrent severe without psychotic features: Secondary | ICD-10-CM | POA: Diagnosis not present

## 2012-05-15 DIAGNOSIS — F332 Major depressive disorder, recurrent severe without psychotic features: Secondary | ICD-10-CM | POA: Diagnosis not present

## 2012-05-16 DIAGNOSIS — F259 Schizoaffective disorder, unspecified: Secondary | ICD-10-CM | POA: Diagnosis not present

## 2012-05-18 DIAGNOSIS — F333 Major depressive disorder, recurrent, severe with psychotic symptoms: Secondary | ICD-10-CM | POA: Diagnosis not present

## 2012-05-23 DIAGNOSIS — F333 Major depressive disorder, recurrent, severe with psychotic symptoms: Secondary | ICD-10-CM | POA: Diagnosis not present

## 2012-05-29 DIAGNOSIS — F333 Major depressive disorder, recurrent, severe with psychotic symptoms: Secondary | ICD-10-CM | POA: Diagnosis not present

## 2012-05-31 DIAGNOSIS — R350 Frequency of micturition: Secondary | ICD-10-CM | POA: Diagnosis not present

## 2012-06-05 DIAGNOSIS — F333 Major depressive disorder, recurrent, severe with psychotic symptoms: Secondary | ICD-10-CM | POA: Diagnosis not present

## 2012-06-08 ENCOUNTER — Other Ambulatory Visit: Payer: Self-pay | Admitting: Family Medicine

## 2012-06-08 DIAGNOSIS — R102 Pelvic and perineal pain: Secondary | ICD-10-CM

## 2012-06-08 DIAGNOSIS — R350 Frequency of micturition: Secondary | ICD-10-CM | POA: Diagnosis not present

## 2012-06-08 DIAGNOSIS — N949 Unspecified condition associated with female genital organs and menstrual cycle: Secondary | ICD-10-CM | POA: Diagnosis not present

## 2012-06-08 DIAGNOSIS — R109 Unspecified abdominal pain: Secondary | ICD-10-CM | POA: Diagnosis not present

## 2012-06-08 DIAGNOSIS — F333 Major depressive disorder, recurrent, severe with psychotic symptoms: Secondary | ICD-10-CM | POA: Diagnosis not present

## 2012-06-09 ENCOUNTER — Other Ambulatory Visit: Payer: 59

## 2012-06-09 ENCOUNTER — Ambulatory Visit
Admission: RE | Admit: 2012-06-09 | Discharge: 2012-06-09 | Disposition: A | Discharge status: Home / Self Care | Payer: 59 | Source: Ambulatory Visit | Attending: Family Medicine | Admitting: Family Medicine

## 2012-06-09 ENCOUNTER — Other Ambulatory Visit: Payer: Self-pay | Admitting: Family Medicine

## 2012-06-09 DIAGNOSIS — N1330 Unspecified hydronephrosis: Secondary | ICD-10-CM

## 2012-06-09 DIAGNOSIS — R102 Pelvic and perineal pain: Secondary | ICD-10-CM

## 2012-06-09 DIAGNOSIS — K838 Other specified diseases of biliary tract: Secondary | ICD-10-CM

## 2012-06-09 DIAGNOSIS — R110 Nausea: Secondary | ICD-10-CM | POA: Diagnosis not present

## 2012-06-09 DIAGNOSIS — R197 Diarrhea, unspecified: Secondary | ICD-10-CM | POA: Diagnosis not present

## 2012-06-09 DIAGNOSIS — R748 Abnormal levels of other serum enzymes: Secondary | ICD-10-CM | POA: Diagnosis not present

## 2012-06-09 MED ORDER — IOHEXOL 300 MG/ML  SOLN
100.0000 mL | Freq: Once | INTRAMUSCULAR | Status: AC | PRN
  Administered 2012-06-09: 100 mL via INTRAVENOUS

## 2012-06-10 ENCOUNTER — Emergency Department (HOSPITAL_COMMUNITY)
Admission: EM | Admit: 2012-06-10 | Discharge: 2012-06-10 | Disposition: A | Discharge status: Home / Self Care | Payer: 59 | Attending: Emergency Medicine | Admitting: Emergency Medicine

## 2012-06-10 DIAGNOSIS — R109 Unspecified abdominal pain: Secondary | ICD-10-CM | POA: Insufficient documentation

## 2012-06-10 DIAGNOSIS — F3289 Other specified depressive episodes: Secondary | ICD-10-CM | POA: Insufficient documentation

## 2012-06-10 DIAGNOSIS — D649 Anemia, unspecified: Secondary | ICD-10-CM | POA: Insufficient documentation

## 2012-06-10 DIAGNOSIS — F329 Major depressive disorder, single episode, unspecified: Secondary | ICD-10-CM | POA: Insufficient documentation

## 2012-06-10 DIAGNOSIS — F411 Generalized anxiety disorder: Secondary | ICD-10-CM | POA: Insufficient documentation

## 2012-06-10 LAB — CBC
HCT: 40.5 % (ref 36.0–46.0)
Hemoglobin: 13 g/dL (ref 12.0–15.0)
MCH: 27.2 pg (ref 26.0–34.0)
MCHC: 32.1 g/dL (ref 30.0–36.0)
MCV: 84.7 fL (ref 78.0–100.0)
RDW: 13.7 % (ref 11.5–15.5)

## 2012-06-10 LAB — DIFFERENTIAL
Basophils Absolute: 0 10*3/uL (ref 0.0–0.1)
Basophils Relative: 1 % (ref 0–1)
Eosinophils Relative: 2 % (ref 0–5)
Monocytes Absolute: 0.6 10*3/uL (ref 0.1–1.0)
Monocytes Relative: 11 % (ref 3–12)

## 2012-06-10 LAB — HEPATIC FUNCTION PANEL
Albumin: 3.4 g/dL — ABNORMAL LOW (ref 3.5–5.2)
Total Protein: 6.4 g/dL (ref 6.0–8.3)

## 2012-06-10 LAB — POCT I-STAT, CHEM 8
BUN: 9 mg/dL (ref 6–23)
Calcium, Ion: 1.21 mmol/L (ref 1.12–1.32)
Chloride: 105 mEq/L (ref 96–112)
HCT: 40 % (ref 36.0–46.0)
Sodium: 142 mEq/L (ref 135–145)
TCO2: 23 mmol/L (ref 0–100)

## 2012-06-10 LAB — URINALYSIS, ROUTINE W REFLEX MICROSCOPIC
Bilirubin Urine: NEGATIVE
Hgb urine dipstick: NEGATIVE
Ketones, ur: NEGATIVE mg/dL
Nitrite: NEGATIVE
Specific Gravity, Urine: 1.032 — ABNORMAL HIGH (ref 1.005–1.030)
Urobilinogen, UA: 0.2 mg/dL (ref 0.0–1.0)

## 2012-06-10 LAB — LIPASE, BLOOD: Lipase: 30 U/L (ref 11–59)

## 2012-06-10 MED ORDER — HYDROMORPHONE HCL 2 MG PO TABS: 2.0000 mg | ORAL_TABLET | ORAL | Status: AC | PRN

## 2012-06-10 MED ORDER — HYDROMORPHONE HCL PF 1 MG/ML IJ SOLN
0.5000 mg | Freq: Once | INTRAMUSCULAR | Status: AC
  Administered 2012-06-10: 0.5 mg via INTRAVENOUS
  Filled 2012-06-10: qty 1

## 2012-06-10 MED ORDER — ONDANSETRON HCL 4 MG/2ML IJ SOLN
4.0000 mg | Freq: Once | INTRAMUSCULAR | Status: AC
  Administered 2012-06-10: 4 mg via INTRAVENOUS
  Filled 2012-06-10: qty 2

## 2012-06-10 MED ORDER — HYDROMORPHONE HCL PF 1 MG/ML IJ SOLN
1.0000 mg | Freq: Once | INTRAMUSCULAR | Status: AC
  Administered 2012-06-10: 1 mg via INTRAVENOUS
  Filled 2012-06-10: qty 1

## 2012-06-10 NOTE — ED Provider Notes (Signed)
History     CSN: [id number]  Arrival date & time 06/10/12  1137   First MD Initiated Contact with Patient 06/10/12 1203      Chief Complaint  Patient presents with  . Flank Pain    (Consider location/radiation/quality/duration/timing/severity/associated sxs/prior treatment) Patient is a 41 y.o. female presenting with flank pain. The history is provided by the patient and a relative.  Flank Pain This is a new problem. Pertinent negatives include no chest pain, no abdominal pain, no headaches and no shortness of breath.   patient's had recent left-sided flank pain. She was seen by her PCP on Thursday and ultrasound and CT scan. Ultrasound showed hydronephrosis. CT scan showed hydronephrosis without clear stone. It also showed bilateral renal lesions are likely need an MRI. Patient states urinalysis had shown a urinary tract infection previously. She returns because she continues to have nausea vomiting abdominal pain. She's previously had a left nephrostomy after previous stone. It was just a drainage nephrostomy and was removed with a stent. She no longer has the ureteral stent. No fevers.  Past Medical History  Diagnosis Date  . Heart murmur     asa child   . Anemia     hx of   . Chronic kidney disease     kidney stone   . Depression   . Anxiety     Past Surgical History  Procedure Date  . Tubal ligation   . Other surgical history     cyst removed from ovary ? side   . Nephrolithotomy 03/31/2012    Procedure: NEPHROLITHOTOMY PERCUTANEOUS;  Surgeon: Garnett Farm, MD;  Location: [facility];  Service: Urology;  Laterality: Left;    No family history on file.  History  Substance Use Topics  . Smoking status: Never Smoker   . Smokeless tobacco: Never Used  . Alcohol Use: Yes     rare    OB History    Grav Para Term Preterm Abortions TAB SAB Ect Mult Living                  Review of Systems  Constitutional: Negative for activity change and appetite change.  HENT:  Negative for neck stiffness.   Eyes: Negative for pain.  Respiratory: Negative for chest tightness and shortness of breath.   Cardiovascular: Negative for chest pain and leg swelling.  Gastrointestinal: Positive for nausea. Negative for vomiting, abdominal pain and diarrhea.  Genitourinary: Positive for flank pain. Negative for dysuria.  Musculoskeletal: Negative for back pain.  Skin: Negative for rash.  Neurological: Negative for weakness, numbness and headaches.  Psychiatric/Behavioral: Negative for behavioral problems.    Allergies  Augmentin and Avelox  Home Medications   Current Outpatient Rx  Name Route Sig Dispense Refill  . BUSPIRONE HCL 15 MG PO TABS Oral Take 15 mg by mouth 3 (three) times daily.    [name] FIBER SELECT GUMMIES PO Oral Take 1 tablet by mouth daily.    [name] FLUOXETINE HCL 40 MG PO CAPS Oral Take 40 mg by mouth daily before breakfast.     . HYDROCODONE-ACETAMINOPHEN 5-325 MG PO TABS Oral Take 1 tablet by mouth every 6 (six) hours as needed. Pain     . HYDROXYZINE HCL 50 MG PO TABS Oral Take 50 mg by mouth 3 (three) times daily as needed. Sleep and anxiety      . LOPERAMIDE HCL 2 MG PO CAPS Oral Take 2 mg by mouth daily.    [name] RISPERIDONE 3 MG  PO TABS Oral Take 3 mg by mouth 2 (two) times daily.    . TRAZODONE HCL 100 MG PO TABS Oral Take 200 mg by mouth at bedtime.     [name] HYDROMORPHONE HCL 2 MG PO TABS Oral Take 1 tablet (2 mg total) by mouth every 4 (four) hours as needed for pain. 15 tablet 0    BP 126/54  Pulse 91  Temp 98 F (36.7 C) (Oral)  Resp 16  SpO2 99%  LMP 11/27/2011  Physical Exam  Nursing note and vitals reviewed. Constitutional: She is oriented to person, place, and time. She appears well-developed.       Patient appears uncomfortable  HENT:  Head: Normocephalic and atraumatic.  Eyes: EOM are normal. Pupils are equal, round, and reactive to light.  Neck: Normal range of motion. Neck supple.  Cardiovascular: Normal rate, regular rhythm and  normal heart sounds.   No murmur heard. Pulmonary/Chest: Effort normal and breath sounds normal. No respiratory distress. She has no wheezes. She has no rales.  Abdominal: Soft. Bowel sounds are normal. She exhibits no distension. There is tenderness. There is no rebound and no guarding.       Mild left upper quadrant tenderness without rebound guarding.  Genitourinary:       No CVA tenderness  Musculoskeletal: Normal range of motion.  Neurological: She is alert and oriented to person, place, and time. No cranial nerve deficit.  Skin: Skin is warm and dry.  Psychiatric: She has a normal mood and affect. Her speech is normal.    ED Course  Procedures (including critical care time)  Labs Reviewed  URINALYSIS, ROUTINE W REFLEX MICROSCOPIC - Abnormal; Notable for the following:    Specific Gravity, Urine 1.032 (*)     All other components within normal limits  HEPATIC FUNCTION PANEL - Abnormal; Notable for the following:    Albumin 3.4 (*)     Total Bilirubin 0.2 (*)     All other components within normal limits  CBC  DIFFERENTIAL  POCT I-STAT, CHEM 8  LIPASE, BLOOD  URINE CULTURE   US Abdomen Complete  06/09/2012  *RADIOLOGY REPORT*  Clinical Data:  Abnormal liver and renal function tests, previous renal stones.  Low pelvic pain, nausea.  ABDOMINAL ULTRASOUND COMPLETE  Comparison:  04/20/2012, CT 03/14/2012  Findings:  Gallbladder:  No gallstones, gallbladder wall thickening, or pericholecystic fluid.  Common Bile Duct:  Fusiform mild ectasia at the mid common duct is again noted measuring 10 mm maximally.  This is unchanged allowing for differences in technique.  Liver: Inhomogeneous in echotexture without focal abnormality.  No intrahepatic ductal dilatation.  IVC:  Intrahepatic IVC is normal in appearance.  Pancreas:  Not well visualized at the tail portion due to overlying bowel gas.  Spleen:  Within normal limits in size and echotexture.  Right kidney:  10.8 cm. No hydronephrosis or  focal mass.  Left kidney:  10.7 cm.  1.2 cm echogenic shadowing left lower renal pole calculus.  Mild left hydronephrosis is noted.  The proximal ureter is not visualized.  Abdominal Aorta:  No aneurysm identified.  IMPRESSION: Mild left hydronephrosis and left lower renal pole calculus. Although the proximal ureter is not visualized, this raises the question of possible ureteral calculus causing obstruction, or other etiologies could also account for this appearance and produce obstruction.  Given the size of the previously seen 9mm proximal left ureteral calculus on dissimilar comparison exam 03/14/2012, this may be amenable to plain film radiography for  further evaluation, or CT.  Fusiform midportion ectasia of the common duct again noted. Nonradiopaque stone or stricture could have this appearance, as this is new from prior dissimilar exam and therefore unlikely to represent congenital variant such as choledochal cyst.  Original Report Authenticated By: Harrel Lemon, M.D.   Ct Abdomen Pelvis W Contrast  06/09/2012  *RADIOLOGY REPORT*  Clinical Data: Hydronephrosis.  Dilated common bile duct.  CT ABDOMEN AND PELVIS WITH CONTRAST  Technique:  Multidetector CT imaging of the abdomen and pelvis was performed following the standard protocol during bolus administration of intravenous contrast.  Contrast: OMNIPAQUE IOHEXOL 300 MG/ML  SOLN  Comparison: Abdominal ultrasound 06/09/2012.  Findings:  Lung Bases: Unremarkable.  Abdomen/Pelvis:  The appearance of the liver is normal. Specifically, no intrahepatic biliary ductal dilatation.  Common bile duct measures 10 mm within the porta hepatis, but tapers distally.  The appearance of the gallbladder, pancreas, spleen and bilateral adrenal glands is unremarkable.  The lower pole of the right kidney there is a 1.2 cm lesion that measures 46 HU on the portal venous phase of the examination and appears to de-enhance to only 22 HU on the delayed portion of the  examination, concerning for a potential cystic renal neoplasm.  There is also a smaller lesion in the interpolar region of the right kidney which is too small to definitively characterize.  There is moderate left-sided hydronephrosis without ureteral dilatation.  The urothelium within the left renal collecting system and left renal pelvis appears to be enhancing.  There are some renal calculi present within the lower pole collecting system of the right kidney, the largest which measures up to 8 mm in length. There is severe parenchymal atrophy in the left kidney.  No definite obstructing left ureteral stone is identified.  No ascites or pneumoperitoneum and no pathologic distension of bowel.  The appendix is normal.  No definite pathologic lymphadenopathy identified within the abdomen or pelvis.  Numerous small low attenuation lesions in the ovaries bilaterally are likely to represent follicles in this young female patient.  Uterus and urinary bladder are unremarkable in appearance.  Musculoskeletal: There are no aggressive appearing lytic or blastic lesions noted in the visualized portions of the skeleton.  IMPRESSION: 1.  Moderate left-sided hydronephrosis which abruptly terminates at the left ureteropelvic junction.  No obstructing stone is noted at this time.  Findings could suggest a UPJ stricture related to prior stone (there was a stone in this region noted on prior CT scan 03/14/2012).  Clinical correlation is recommended.  Notably, there is enhancement of the urothelium within the left renal collecting system and left renal pelvis, which could suggest infection.  No definite mass is identified at this time to strongly suggest the presence of a urothelial neoplasm (however, the collecting system did not opacify completely on delayed images). 2.  Nonobstructive calculi in the lower pole collecting system of the left kidney are noted. 3.  1.2 cm lesion in the lower pole of the right kidney demonstrates  potential de-enhancement on delayed imaging. Additionally, on prior examination from 03/14/2012 this lesion measured only 17 HU on precontrast images suggesting that it enhanced on today's examination.  Overall, findings are concerning for potential small cystic renal neoplasm.  Given the fact that this lesion was occult on today's ultrasound examination, further evaluation with MRI is recommended to exclude neoplasm. 4.  Common bile duct is focally dilated to 10 mm within the porta hepatis.  However, there is no evidence of biliary  tract obstruction.  This is nonspecific, but could suggest the presence of a choledochal cyst.  Original Report Authenticated By: Florencia Reasons, M.D.     1. Abdominal pain       MDM  Patient with left flank pain. Recently had CAT scan showed hydronephrosis and renal masses. She was seen by nephrology in ER. Dr. Berneice Heinrich believes that pain is not from the hydronephrosis. There is no urinary tract infection. Common bile duct is mildly dilated, but patient has normal LFTs. Patient be discharged to follow with urology and gastroenterology. She was given oral Dilaudid for pain control.        Juliet Rude. Rubin Payor, MD 06/10/12 1540

## 2012-06-10 NOTE — Consult Note (Signed)
Reason for Consult:abdominal pain, left hydronephrosis Referring Physician: ER physician  [name] [name] is an 41 y.o. female.  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 Vernie Ammons. Pt did very well post-op but then developed diffuse abdominal pain 3 days ago. Pain 7/10, diffuse, more right sided and anterior than flank. CT imaging withtout obstructing stones and resolving (expected) left hydro, as she had massive hydro previously.  No fever, leukocytosis. UA normal.  Admits to chronic diarrhea.  2 - Rt renal Mass - incidental 17mm indeterminate lesion on CT for above.    Past Medical History  Diagnosis Date  . Heart murmur     asa child   . Anemia     hx of   . Chronic kidney disease     kidney stone   . Depression   . Anxiety     Past Surgical History  Procedure Date  . Tubal ligation   . Other surgical history     cyst removed from ovary ? side   . Nephrolithotomy 03/31/2012    Procedure: NEPHROLITHOTOMY PERCUTANEOUS;  Surgeon: Garnett Farm, MD;  Location: [facility];  Service: Urology;  Laterality: Left;    No family history on file.  Social History:  reports that she has never smoked. She has never used smokeless tobacco. She reports that she drinks alcohol. She reports that she does not use illicit drugs.  Allergies:  Allergies  Allergen Reactions  . Augmentin (Amoxicillin-Pot Clavulanate) Rash  . Avelox (Moxifloxacin Hcl In Nacl) Rash    Medications: I have reviewed the patient's current medications.  Results for orders placed during the hospital encounter of 06/10/12 (from the past 48 hour(s))  URINALYSIS, ROUTINE W REFLEX MICROSCOPIC     Status: Abnormal   Collection Time   06/10/12 12:05 PM      Component Value Range Comment   Color, Urine YELLOW  YELLOW    APPearance CLEAR  CLEAR    Specific Gravity, Urine 1.032 (*) 1.005 - 1.030    pH 5.5  5.0 - 8.0    Glucose, UA NEGATIVE  NEGATIVE mg/dL    Hgb urine dipstick  NEGATIVE  NEGATIVE    Bilirubin Urine NEGATIVE  NEGATIVE    Ketones, ur NEGATIVE  NEGATIVE mg/dL    Protein, ur NEGATIVE  NEGATIVE mg/dL    Urobilinogen, UA 0.2  0.0 - 1.0 mg/dL    Nitrite NEGATIVE  NEGATIVE    Leukocytes, UA NEGATIVE  NEGATIVE MICROSCOPIC NOT DONE ON URINES WITH NEGATIVE PROTEIN, BLOOD, LEUKOCYTES, NITRITE, OR GLUCOSE <1000 mg/dL.  CBC     Status: Normal   Collection Time   06/10/12 12:45 PM      Component Value Range Comment   WBC 5.5  4.0 - 10.5 K/uL    RBC 4.78  3.87 - 5.11 MIL/uL    Hemoglobin 13.0  12.0 - 15.0 g/dL    HCT 84.6  96.2 - 95.2 %    MCV 84.7  78.0 - 100.0 fL    MCH 27.2  26.0 - 34.0 pg    MCHC 32.1  30.0 - 36.0 g/dL    RDW 84.1  32.4 - 40.1 %    Platelets 292  150 - 400 K/uL   DIFFERENTIAL     Status: Normal   Collection Time   06/10/12 12:45 PM      Component Value Range Comment   Neutrophils Relative 53  43 -  77 %    Neutro Abs 2.9  1.7 - 7.7 K/uL    Lymphocytes Relative 35  12 - 46 %    Lymphs Abs 1.9  0.7 - 4.0 K/uL    Monocytes Relative 11  3 - 12 %    Monocytes Absolute 0.6  0.1 - 1.0 K/uL    Eosinophils Relative 2  0 - 5 %    Eosinophils Absolute 0.1  0.0 - 0.7 K/uL    Basophils Relative 1  0 - 1 %    Basophils Absolute 0.0  0.0 - 0.1 K/uL   POCT I-STAT, CHEM 8     Status: Normal   Collection Time   06/10/12 12:56 PM      Component Value Range Comment   Sodium 142  135 - 145 mEq/L    Potassium 4.0  3.5 - 5.1 mEq/L    Chloride 105  96 - 112 mEq/L    BUN 9  6 - 23 mg/dL    Creatinine, Ser 1.61  0.50 - 1.10 mg/dL    Glucose, Bld 91  70 - 99 mg/dL    Calcium, Ion 0.96  0.45 - 1.32 mmol/L    TCO2 23  0 - 100 mmol/L    Hemoglobin 13.6  12.0 - 15.0 g/dL    HCT 40.9  81.1 - 91.4 %     US Abdomen Complete  06/09/2012  *RADIOLOGY REPORT*  Clinical Data:  Abnormal liver and renal function tests, previous renal stones.  Low pelvic pain, nausea.  ABDOMINAL ULTRASOUND COMPLETE  Comparison:  04/20/2012, CT 03/14/2012  Findings:  Gallbladder:   No gallstones, gallbladder wall thickening, or pericholecystic fluid.  Common Bile Duct:  Fusiform mild ectasia at the mid common duct is again noted measuring 10 mm maximally.  This is unchanged allowing for differences in technique.  Liver: Inhomogeneous in echotexture without focal abnormality.  No intrahepatic ductal dilatation.  IVC:  Intrahepatic IVC is normal in appearance.  Pancreas:  Not well visualized at the tail portion due to overlying bowel gas.  Spleen:  Within normal limits in size and echotexture.  Right kidney:  10.8 cm. No hydronephrosis or focal mass.  Left kidney:  10.7 cm.  1.2 cm echogenic shadowing left lower renal pole calculus.  Mild left hydronephrosis is noted.  The proximal ureter is not visualized.  Abdominal Aorta:  No aneurysm identified.  IMPRESSION: Mild left hydronephrosis and left lower renal pole calculus. Although the proximal ureter is not visualized, this raises the question of possible ureteral calculus causing obstruction, or other etiologies could also account for this appearance and produce obstruction.  Given the size of the previously seen 9mm proximal left ureteral calculus on dissimilar comparison exam 03/14/2012, this may be amenable to plain film radiography for further evaluation, or CT.  Fusiform midportion ectasia of the common duct again noted. Nonradiopaque stone or stricture could have this appearance, as this is new from prior dissimilar exam and therefore unlikely to represent congenital variant such as choledochal cyst.  Original Report Authenticated By: Harrel Lemon, M.D.   Ct Abdomen Pelvis W Contrast  06/09/2012  *RADIOLOGY REPORT*  Clinical Data: Hydronephrosis.  Dilated common bile duct.  CT ABDOMEN AND PELVIS WITH CONTRAST  Technique:  Multidetector CT imaging of the abdomen and pelvis was performed following the standard protocol during bolus administration of intravenous contrast.  Contrast: OMNIPAQUE IOHEXOL 300 MG/ML  SOLN   Comparison: Abdominal ultrasound 06/09/2012.  Findings:  Lung Bases: Unremarkable.  Abdomen/Pelvis:  The appearance of the liver is normal. Specifically, no intrahepatic biliary ductal dilatation.  Common bile duct measures 10 mm within the porta hepatis, but tapers distally.  The appearance of the gallbladder, pancreas, spleen and bilateral adrenal glands is unremarkable.  The lower pole of the right kidney there is a 1.2 cm lesion that measures 46 HU on the portal venous phase of the examination and appears to de-enhance to only 22 HU on the delayed portion of the examination, concerning for a potential cystic renal neoplasm.  There is also a smaller lesion in the interpolar region of the right kidney which is too small to definitively characterize.  There is moderate left-sided hydronephrosis without ureteral dilatation.  The urothelium within the left renal collecting system and left renal pelvis appears to be enhancing.  There are some renal calculi present within the lower pole collecting system of the right kidney, the largest which measures up to 8 mm in length. There is severe parenchymal atrophy in the left kidney.  No definite obstructing left ureteral stone is identified.  No ascites or pneumoperitoneum and no pathologic distension of bowel.  The appendix is normal.  No definite pathologic lymphadenopathy identified within the abdomen or pelvis.  Numerous small low attenuation lesions in the ovaries bilaterally are likely to represent follicles in this young female patient.  Uterus and urinary bladder are unremarkable in appearance.  Musculoskeletal: There are no aggressive appearing lytic or blastic lesions noted in the visualized portions of the skeleton.  IMPRESSION: 1.  Moderate left-sided hydronephrosis which abruptly terminates at the left ureteropelvic junction.  No obstructing stone is noted at this time.  Findings could suggest a UPJ stricture related to prior stone (there was a stone in this  region noted on prior CT scan 03/14/2012).  Clinical correlation is recommended.  Notably, there is enhancement of the urothelium within the left renal collecting system and left renal pelvis, which could suggest infection.  No definite mass is identified at this time to strongly suggest the presence of a urothelial neoplasm (however, the collecting system did not opacify completely on delayed images). 2.  Nonobstructive calculi in the lower pole collecting system of the left kidney are noted. 3.  1.2 cm lesion in the lower pole of the right kidney demonstrates potential de-enhancement on delayed imaging. Additionally, on prior examination from 03/14/2012 this lesion measured only 17 HU on precontrast images suggesting that it enhanced on today's examination.  Overall, findings are concerning for potential small cystic renal neoplasm.  Given the fact that this lesion was occult on today's ultrasound examination, further evaluation with MRI is recommended to exclude neoplasm. 4.  Common bile duct is focally dilated to 10 mm within the porta hepatis.  However, there is no evidence of biliary tract obstruction.  This is nonspecific, but could suggest the presence of a choledochal cyst.  Original Report Authenticated By: Florencia Reasons, M.D.    Review of Systems  Constitutional: Positive for malaise/fatigue.  HENT: Negative.   Eyes: Negative.   Respiratory: Negative.   Cardiovascular: Negative.   Gastrointestinal: Positive for abdominal pain.  Genitourinary: Negative for dysuria, urgency, frequency, hematuria and flank pain.  Musculoskeletal: Positive for myalgias.  Skin: Negative.   Neurological: Negative.   Endo/Heme/Allergies: Negative.   Psychiatric/Behavioral: The patient is nervous/anxious.    Blood pressure 126/54, pulse 91, temperature 98 F (36.7 C), temperature source Oral, resp. rate 16, last menstrual period 11/27/2011, SpO2 99.00%. Physical Exam  Constitutional: She appears  well-developed and well-nourished.       Husband present. Pt very anxious  HENT:  Head: Normocephalic and atraumatic.  Eyes: Pupils are equal, round, and reactive to light.  Neck: Normal range of motion. Neck supple.  Cardiovascular: Normal rate and regular rhythm.   Respiratory: Effort normal and breath sounds normal.  GI: Soft.       Pt with diffuse anterior abd tenderness most focally RUQ and pelvic.   Genitourinary:       Very mild bilat CVAT, although diffusly TTP.  Musculoskeletal: Normal range of motion.  Neurological: She is alert.  Skin: Skin is warm and dry.  Psychiatric: She has a normal mood and affect. Her behavior is normal. Judgment and thought content normal.    Assessment/Plan: 1 - Abdominal Pain - Presentation unusual for GU infection or pain. Urine studies and infectious parameters unremarkable. Imaging without obvious GU obstruction, resolving let hydro is on opposite side of present pain.  Consider non-GU causes as more likely especially GI / Gallbladder / Colonic.  2 - Rt renal Mass - Small renal mass. We briefly outlined strategies of observation, ablation, surgery, but will defer more detailed discussion until office f/u.   Ahman Dugdale 06/10/2012, 3:16 PM

## 2012-06-10 NOTE — ED Notes (Signed)
Pt c/o right and left side flank pain.  Pt was seen by PCP Thursday had ultrasound and ct scan on Friday and per patient, results showed left side kidney stones and growth.  Pt also states that fluid wwas seen on kidneys.  Pt was advised by MD to come in toED worsening pain.  Pt to follow up md at [facility] on Monday.

## 2012-06-11 LAB — URINE CULTURE: Culture  Setup Time: 201306152024

## 2012-06-12 DIAGNOSIS — N1330 Unspecified hydronephrosis: Secondary | ICD-10-CM | POA: Diagnosis not present

## 2012-06-12 DIAGNOSIS — K838 Other specified diseases of biliary tract: Secondary | ICD-10-CM | POA: Diagnosis not present

## 2012-06-12 DIAGNOSIS — N289 Disorder of kidney and ureter, unspecified: Secondary | ICD-10-CM | POA: Diagnosis not present

## 2012-06-12 DIAGNOSIS — R109 Unspecified abdominal pain: Secondary | ICD-10-CM | POA: Diagnosis not present

## 2012-06-15 ENCOUNTER — Other Ambulatory Visit (HOSPITAL_COMMUNITY): Payer: Self-pay | Admitting: Urology

## 2012-06-15 DIAGNOSIS — N269 Renal sclerosis, unspecified: Secondary | ICD-10-CM | POA: Diagnosis not present

## 2012-06-15 DIAGNOSIS — D49519 Neoplasm of unspecified behavior of unspecified kidney: Secondary | ICD-10-CM

## 2012-06-15 DIAGNOSIS — D4959 Neoplasm of unspecified behavior of other genitourinary organ: Secondary | ICD-10-CM | POA: Diagnosis not present

## 2012-06-15 DIAGNOSIS — Q6239 Other obstructive defects of renal pelvis and ureter: Secondary | ICD-10-CM | POA: Diagnosis not present

## 2012-06-16 ENCOUNTER — Ambulatory Visit (HOSPITAL_COMMUNITY)
Admission: RE | Admit: 2012-06-16 | Discharge: 2012-06-16 | Disposition: A | Discharge status: Home / Self Care | Payer: 59 | Source: Ambulatory Visit | Attending: Urology | Admitting: Urology

## 2012-06-16 DIAGNOSIS — D4959 Neoplasm of unspecified behavior of other genitourinary organ: Secondary | ICD-10-CM | POA: Insufficient documentation

## 2012-06-16 DIAGNOSIS — K7689 Other specified diseases of liver: Secondary | ICD-10-CM | POA: Insufficient documentation

## 2012-06-16 DIAGNOSIS — N1330 Unspecified hydronephrosis: Secondary | ICD-10-CM | POA: Insufficient documentation

## 2012-06-16 DIAGNOSIS — D49519 Neoplasm of unspecified behavior of unspecified kidney: Secondary | ICD-10-CM

## 2012-06-16 DIAGNOSIS — Q619 Cystic kidney disease, unspecified: Secondary | ICD-10-CM | POA: Insufficient documentation

## 2012-06-16 MED ORDER — GADOBENATE DIMEGLUMINE 529 MG/ML IV SOLN
18.0000 mL | Freq: Once | INTRAVENOUS | Status: AC | PRN
  Administered 2012-06-16: 18 mL via INTRAVENOUS

## 2012-06-19 DIAGNOSIS — F333 Major depressive disorder, recurrent, severe with psychotic symptoms: Secondary | ICD-10-CM | POA: Diagnosis not present

## 2012-06-20 DIAGNOSIS — F333 Major depressive disorder, recurrent, severe with psychotic symptoms: Secondary | ICD-10-CM | POA: Diagnosis not present

## 2012-06-26 ENCOUNTER — Other Ambulatory Visit: Payer: Self-pay | Admitting: Gastroenterology

## 2012-06-26 DIAGNOSIS — R197 Diarrhea, unspecified: Secondary | ICD-10-CM | POA: Diagnosis not present

## 2012-06-26 DIAGNOSIS — R932 Abnormal findings on diagnostic imaging of liver and biliary tract: Secondary | ICD-10-CM | POA: Diagnosis not present

## 2012-06-27 DIAGNOSIS — F333 Major depressive disorder, recurrent, severe with psychotic symptoms: Secondary | ICD-10-CM | POA: Diagnosis not present

## 2012-07-04 DIAGNOSIS — F259 Schizoaffective disorder, unspecified: Secondary | ICD-10-CM | POA: Diagnosis not present

## 2012-07-04 DIAGNOSIS — F333 Major depressive disorder, recurrent, severe with psychotic symptoms: Secondary | ICD-10-CM | POA: Diagnosis not present

## 2012-07-05 ENCOUNTER — Ambulatory Visit
Admission: RE | Admit: 2012-07-05 | Discharge: 2012-07-05 | Disposition: A | Discharge status: Home / Self Care | Payer: 59 | Source: Ambulatory Visit | Attending: Gastroenterology | Admitting: Gastroenterology

## 2012-07-05 DIAGNOSIS — R932 Abnormal findings on diagnostic imaging of liver and biliary tract: Secondary | ICD-10-CM

## 2012-07-05 MED ORDER — GADOXETATE DISODIUM 0.25 MMOL/ML IV SOLN
9.0000 mL | Freq: Once | INTRAVENOUS | Status: AC | PRN
  Administered 2012-07-05: 9 mL via INTRAVENOUS

## 2012-07-06 DIAGNOSIS — F259 Schizoaffective disorder, unspecified: Secondary | ICD-10-CM | POA: Diagnosis not present

## 2012-07-06 DIAGNOSIS — F333 Major depressive disorder, recurrent, severe with psychotic symptoms: Secondary | ICD-10-CM | POA: Diagnosis not present

## 2012-07-11 DIAGNOSIS — F333 Major depressive disorder, recurrent, severe with psychotic symptoms: Secondary | ICD-10-CM | POA: Diagnosis not present

## 2012-07-12 DIAGNOSIS — R197 Diarrhea, unspecified: Secondary | ICD-10-CM | POA: Diagnosis not present

## 2012-07-12 DIAGNOSIS — D134 Benign neoplasm of liver: Secondary | ICD-10-CM | POA: Diagnosis not present

## 2012-07-13 ENCOUNTER — Other Ambulatory Visit: Payer: Self-pay | Admitting: Family Medicine

## 2012-07-13 DIAGNOSIS — Z1231 Encounter for screening mammogram for malignant neoplasm of breast: Secondary | ICD-10-CM

## 2012-07-17 ENCOUNTER — Emergency Department (HOSPITAL_COMMUNITY)
Admission: EM | Admit: 2012-07-17 | Discharge: 2012-07-17 | Disposition: A | Discharge status: Home / Self Care | Payer: 59 | Attending: Emergency Medicine | Admitting: Emergency Medicine

## 2012-07-17 ENCOUNTER — Encounter (HOSPITAL_COMMUNITY): Payer: Self-pay | Admitting: Emergency Medicine

## 2012-07-17 ENCOUNTER — Emergency Department (HOSPITAL_COMMUNITY): Payer: 59

## 2012-07-17 DIAGNOSIS — R569 Unspecified convulsions: Secondary | ICD-10-CM

## 2012-07-17 DIAGNOSIS — R109 Unspecified abdominal pain: Secondary | ICD-10-CM | POA: Insufficient documentation

## 2012-07-17 DIAGNOSIS — F411 Generalized anxiety disorder: Secondary | ICD-10-CM | POA: Insufficient documentation

## 2012-07-17 DIAGNOSIS — Z79899 Other long term (current) drug therapy: Secondary | ICD-10-CM | POA: Insufficient documentation

## 2012-07-17 DIAGNOSIS — F3289 Other specified depressive episodes: Secondary | ICD-10-CM | POA: Insufficient documentation

## 2012-07-17 DIAGNOSIS — Z87442 Personal history of urinary calculi: Secondary | ICD-10-CM | POA: Insufficient documentation

## 2012-07-17 DIAGNOSIS — R4182 Altered mental status, unspecified: Secondary | ICD-10-CM | POA: Insufficient documentation

## 2012-07-17 DIAGNOSIS — R404 Transient alteration of awareness: Secondary | ICD-10-CM | POA: Diagnosis not present

## 2012-07-17 DIAGNOSIS — R55 Syncope and collapse: Secondary | ICD-10-CM | POA: Insufficient documentation

## 2012-07-17 DIAGNOSIS — F329 Major depressive disorder, single episode, unspecified: Secondary | ICD-10-CM | POA: Insufficient documentation

## 2012-07-17 LAB — POCT I-STAT, CHEM 8
BUN: 7 mg/dL (ref 6–23)
Calcium, Ion: 1.16 mmol/L (ref 1.12–1.23)
Chloride: 107 mEq/L (ref 96–112)
Creatinine, Ser: 1.2 mg/dL — ABNORMAL HIGH (ref 0.50–1.10)
Glucose, Bld: 104 mg/dL — ABNORMAL HIGH (ref 70–99)
HCT: 41 % (ref 36.0–46.0)
Hemoglobin: 13.9 g/dL (ref 12.0–15.0)
Potassium: 3.6 mEq/L (ref 3.5–5.1)
Sodium: 141 mEq/L (ref 135–145)
TCO2: 21 mmol/L (ref 0–100)

## 2012-07-17 LAB — URINALYSIS, ROUTINE W REFLEX MICROSCOPIC
Nitrite: NEGATIVE
Specific Gravity, Urine: 1.017 (ref 1.005–1.030)
Urobilinogen, UA: 0.2 mg/dL (ref 0.0–1.0)
pH: 6 (ref 5.0–8.0)

## 2012-07-17 LAB — URINE MICROSCOPIC-ADD ON

## 2012-07-17 MED ORDER — MORPHINE SULFATE 4 MG/ML IJ SOLN
4.0000 mg | Freq: Once | INTRAMUSCULAR | Status: AC
  Administered 2012-07-17: 4 mg via INTRAVENOUS
  Filled 2012-07-17: qty 1

## 2012-07-17 MED ORDER — KETOROLAC TROMETHAMINE 30 MG/ML IJ SOLN
30.0000 mg | Freq: Once | INTRAMUSCULAR | Status: AC
  Administered 2012-07-17: 30 mg via INTRAVENOUS
  Filled 2012-07-17: qty 1

## 2012-07-17 MED ORDER — ONDANSETRON HCL 4 MG/2ML IJ SOLN
4.0000 mg | Freq: Once | INTRAMUSCULAR | Status: AC
  Administered 2012-07-17: 4 mg via INTRAVENOUS
  Filled 2012-07-17: qty 2

## 2012-07-17 NOTE — ED Provider Notes (Signed)
History     CSN: [id number]  Arrival date & time 07/17/12  1739   First MD Initiated Contact with Patient 07/17/12 1807      Chief Complaint  Patient presents with  . Seizures    (Consider location/radiation/quality/duration/timing/severity/associated sxs/prior treatment) Patient is a 41 y.o. female presenting with seizures. The history is provided by the patient and the spouse.  Seizures  This is a new problem. The current episode started less than 1 hour ago. The problem has been resolved. There was 1 seizure. The most recent episode lasted less than 30 seconds. Pertinent negatives include no headaches, no sore throat, no chest pain, no cough, no nausea, no vomiting and no diarrhea. Characteristics include rhythmic jerking and loss of consciousness. Characteristics do not include apnea. The episode was witnessed. The seizures did not continue in the ED. The seizure(s) had no focality. There has been no fever. There were no medications administered prior to arrival.    Past Medical History  Diagnosis Date  . Heart murmur     asa child   . Anemia     hx of   . Chronic kidney disease     kidney stone   . Depression   . Anxiety     Past Surgical History  Procedure Date  . Tubal ligation   . Other surgical history     cyst removed from ovary ? side   . Nephrolithotomy 03/31/2012    Procedure: NEPHROLITHOTOMY PERCUTANEOUS;  Surgeon: Garnett Farm, MD;  Location: [facility];  Service: Urology;  Laterality: Left;    History reviewed. No pertinent family history.  History  Substance Use Topics  . Smoking status: Never Smoker   . Smokeless tobacco: Never Used  . Alcohol Use: Yes     rare    OB History    Grav Para Term Preterm Abortions TAB SAB Ect Mult Living                  Review of Systems  Constitutional: Negative for fever, chills, diaphoresis and fatigue.  HENT: Negative for ear pain, congestion, sore throat, facial swelling, mouth sores, trouble swallowing, neck  pain and neck stiffness.   Eyes: Negative.   Respiratory: Negative for apnea, cough, chest tightness, shortness of breath and wheezing.   Cardiovascular: Negative for chest pain, palpitations and leg swelling.  Gastrointestinal: Positive for abdominal pain. Negative for nausea, vomiting, diarrhea and abdominal distention.  Genitourinary: Positive for vaginal bleeding. Negative for hematuria, flank pain, vaginal discharge, difficulty urinating and menstrual problem.  Musculoskeletal: Negative for back pain and gait problem.  Skin: Negative for rash and wound.  Neurological: Positive for seizures and loss of consciousness. Negative for dizziness, tremors, syncope, facial asymmetry, numbness and headaches.  Psychiatric/Behavioral: Positive for agitation. The patient is nervous/anxious.   All other systems reviewed and are negative.    Allergies  Augmentin and Avelox  Home Medications   Current Outpatient Rx  Name Route Sig Dispense Refill  . FIBER SELECT GUMMIES PO Oral Take 1 tablet by mouth daily.    [name] HYDROCODONE-ACETAMINOPHEN 5-325 MG PO TABS Oral Take 1 tablet by mouth every 6 (six) hours as needed. Pain     . HYDROXYZINE HCL 50 MG PO TABS Oral Take 50 mg by mouth 3 (three) times daily as needed. Sleep and anxiety      . LOPERAMIDE HCL 2 MG PO CAPS Oral Take 2 mg by mouth daily.    [name] LURASIDONE HCL 80 MG PO  TABS Oral Take 80 mg by mouth daily with breakfast.    . TRAZODONE HCL 100 MG PO TABS Oral Take 300 mg by mouth at bedtime.       BP 132/87  Pulse 71  Temp 99 F (37.2 C) (Oral)  Resp 13  SpO2 96%  LMP 07/17/2012  Physical Exam  Nursing note and vitals reviewed. Constitutional: She is oriented to person, place, and time. She appears well-developed and well-nourished. No distress.  HENT:  Head: Normocephalic and atraumatic.  Right Ear: External ear normal.  Left Ear: External ear normal.  Nose: Nose normal.  Mouth/Throat: Oropharynx is clear and moist. No  oropharyngeal exudate.  Eyes: Conjunctivae and EOM are normal. Pupils are equal, round, and reactive to light. Right eye exhibits no discharge. Left eye exhibits no discharge.  Neck: Normal range of motion. Neck supple. No JVD present. No tracheal deviation present. No thyromegaly present.  Cardiovascular: Normal rate, regular rhythm, normal heart sounds and intact distal pulses.  Exam reveals no gallop and no friction rub.   No murmur heard. Pulmonary/Chest: Effort normal and breath sounds normal. No respiratory distress. She has no wheezes. She has no rales. She exhibits no tenderness.  Abdominal: Soft. Bowel sounds are normal. She exhibits no distension. There is no tenderness. There is no rebound and no guarding.  Musculoskeletal: Normal range of motion.  Lymphadenopathy:    She has no cervical adenopathy.  Neurological: She is alert and oriented to person, place, and time. No cranial nerve deficit. Coordination normal.  Skin: Skin is warm. No rash noted. She is not diaphoretic.  Psychiatric: Her speech is normal. Her mood appears anxious.    ED Course  Procedures (including critical care time)  Labs Reviewed  URINALYSIS, ROUTINE W REFLEX MICROSCOPIC - Abnormal; Notable for the following:    Color, Urine AMBER (*)  BIOCHEMICALS MAY BE AFFECTED BY COLOR   APPearance CLOUDY (*)     Hgb urine dipstick LARGE (*)     Leukocytes, UA MODERATE (*)     All other components within normal limits  POCT I-STAT, CHEM 8 - Abnormal; Notable for the following:    Creatinine, Ser 1.20 (*)     Glucose, Bld 104 (*)     All other components within normal limits  URINE MICROSCOPIC-ADD ON - Abnormal; Notable for the following:    Squamous Epithelial / LPF FEW (*)     All other components within normal limits   Ct Head Wo Contrast  07/17/2012   *RADIOLOGY REPORT*  Clinical Data:  Seizure, altered mental status  CT HEAD WITHOUT CONTRAST  Technique:  Contiguous axial images were obtained from the base of  the skull through the vertex without contrast  Comparison:  MRI 08/27/2009  Findings:  The brain has a normal appearance without evidence for hemorrhage, acute infarction, hydrocephalus, or mass lesion.  There is no extra axial fluid collection.  The skull and paranasal sinuses are normal.  IMPRESSION: Normal CT of the head without contrast.  Original Report Authenticated By: Camelia Phenes, M.D.     1. Syncope   2. Observed seizure-like activity       MDM  41 year old woman with extensive past medical history of depression treated with electroconvulsive therapy along antidepressants and antipsychotics presents with a "seizure-like episode". Patient has been having lower abdominal pain for many months but has been worked up multiple times with imaging and has yielded no definitive cause. Patient has seen a urologist and a gynecologist for  this problem. Patient says subjectively that she's been having more bleeding with her periods. Patient was at her gynecologist today when this episode happened. Per EMS patient passed out and had jerking like movements that lasted seconds and patient was sent to the emergency department. Husband says the patient has had syncopal episodes with medication changes in the past. Patient recently changed from risperidone to lurasidone. Here patient appears anxious but does not complain of any headache neck pain shortness of breath or chest pain. Patient has same abdominal pain that she's had in the past. Patient with a normal abdominal exam with mild discomfort suprapubically. Patient with diagnosis of chronic pelvic pain in the past. Patient with normal neurological exam is alert and oriented x3. Since this could be a first seizure patient will be sent for CT but she has no neurological deficits and this could also be related to a syncopal episode given her history of syncopal episodes and her psychiatric illness.  Results for orders placed during the hospital encounter of  07/17/12  URINALYSIS, ROUTINE W REFLEX MICROSCOPIC      Component Value Range   Color, Urine AMBER (*) YELLOW   APPearance CLOUDY (*) CLEAR   Specific Gravity, Urine 1.017  1.005 - 1.030   pH 6.0  5.0 - 8.0   Glucose, UA NEGATIVE  NEGATIVE mg/dL   Hgb urine dipstick LARGE (*) NEGATIVE   Bilirubin Urine NEGATIVE  NEGATIVE   Ketones, ur NEGATIVE  NEGATIVE mg/dL   Protein, ur NEGATIVE  NEGATIVE mg/dL   Urobilinogen, UA 0.2  0.0 - 1.0 mg/dL   Nitrite NEGATIVE  NEGATIVE   Leukocytes, UA MODERATE (*) NEGATIVE  POCT I-STAT, CHEM 8      Component Value Range   Sodium 141  135 - 145 mEq/L   Potassium 3.6  3.5 - 5.1 mEq/L   Chloride 107  96 - 112 mEq/L   BUN 7  6 - 23 mg/dL   Creatinine, Ser 0.98 (*) 0.50 - 1.10 mg/dL   Glucose, Bld 119 (*) 70 - 99 mg/dL   Calcium, Ion 1.47  8.29 - 1.23 mmol/L   TCO2 21  0 - 100 mmol/L   Hemoglobin 13.9  12.0 - 15.0 g/dL   HCT 56.2  13.0 - 86.5 %  URINE MICROSCOPIC-ADD ON      Component Value Range   Squamous Epithelial / LPF FEW (*) RARE   WBC, UA 0-2  <3 WBC/hpf   RBC / HPF TOO NUMEROUS TO COUNT  <3 RBC/hpf   Bacteria, UA RARE  RARE   CT Head Wo Contrast (Final result)   Result time:07/17/12 2116    Final result by Rad Results In Interface (07/17/12 21:16:56)    Narrative:   *RADIOLOGY REPORT*  Clinical Data: Seizure, altered mental status  CT HEAD WITHOUT CONTRAST  Technique: Contiguous axial images were obtained from the base of the skull through the vertex without contrast  Comparison: MRI 08/27/2009  Findings: The brain has a normal appearance without evidence for hemorrhage, acute infarction, hydrocephalus, or mass lesion. There is no extra axial fluid collection. The skull and paranasal sinuses are normal.  IMPRESSION: Normal CT of the head without contrast.  Original Report Authenticated By: Camelia Phenes, M.D.    Date: 07/17/2012  Rate: 79  Rhythm: normal sinus rhythm  QRS Axis: normal  Intervals: normal  ST/T Wave  abnormalities: normal  Conduction Disutrbances: none  Narrative Interpretation:   Old EKG Reviewed: No significant changes noted  Patient pain  control here in the emergency department. Patient with normal CT and normal labs so I doubt infectious pathology, new intracranial hemorrhage, new mass lesion in the brain. Likely patient could have had seizure like symptoms after syncopizing. I will instruct patient to continue followup with gynecology for abdominal pain which appears stable and unchanged in mild in nature today. Patient will also see psychiatrist on Friday for her typical ECT therapy. Patient encouraged followup with PCP as well this week.   Case discussed with Dr. Sharlot Gowda, MD 07/17/12 [phone]

## 2012-07-17 NOTE — ED Notes (Signed)
Pt placed on 2 liters 02 via [place] due to O2 Sats dropping to 83% after receiving morphine.  Pt instructed to take deep breath and 02 sats returned to 98%.  Pt remains on 02 via [place] at 2LPM.  Husband remains at bedside.

## 2012-07-17 NOTE — ED Notes (Signed)
Pt to ED via [facility] after reported having a seizure at MD's office.

## 2012-07-17 NOTE — ED Notes (Signed)
Pt st's she wants to leave.  Husband at bedside trying to talk to pt.  Informing her that she needs to stay to be checked out.  Pt pulled IV out and got off of stretcher to sit on floor.  Dr. Lew Dawes at bedside st's he is going to order pt something for pain in her abd.  Pt agrees to stay.

## 2012-07-18 NOTE — ED Provider Notes (Signed)
I saw and evaluated the patient, reviewed the resident's note and I agree with the findings and plan.  Pt with more like syncopal convulsions than actual seizure. Nonfocal neuro exam. W/u reassuring including head CT. Return precautions discussed with pt and husband. Outpt fu.  Raeford Razor, MD 07/18/12 1659

## 2012-07-19 DIAGNOSIS — R569 Unspecified convulsions: Secondary | ICD-10-CM | POA: Diagnosis not present

## 2012-07-19 DIAGNOSIS — N946 Dysmenorrhea, unspecified: Secondary | ICD-10-CM | POA: Diagnosis not present

## 2012-07-19 DIAGNOSIS — N949 Unspecified condition associated with female genital organs and menstrual cycle: Secondary | ICD-10-CM | POA: Diagnosis not present

## 2012-07-24 DIAGNOSIS — F333 Major depressive disorder, recurrent, severe with psychotic symptoms: Secondary | ICD-10-CM | POA: Diagnosis not present

## 2012-07-25 DIAGNOSIS — F333 Major depressive disorder, recurrent, severe with psychotic symptoms: Secondary | ICD-10-CM | POA: Diagnosis not present

## 2012-07-26 DIAGNOSIS — J029 Acute pharyngitis, unspecified: Secondary | ICD-10-CM | POA: Diagnosis not present

## 2012-07-27 DIAGNOSIS — F333 Major depressive disorder, recurrent, severe with psychotic symptoms: Secondary | ICD-10-CM | POA: Diagnosis not present

## 2012-07-31 ENCOUNTER — Inpatient Hospital Stay (HOSPITAL_COMMUNITY): Payer: 59

## 2012-07-31 ENCOUNTER — Emergency Department (HOSPITAL_COMMUNITY): Payer: 59

## 2012-07-31 ENCOUNTER — Emergency Department (HOSPITAL_COMMUNITY): Payer: 59 | Admitting: Anesthesiology

## 2012-07-31 ENCOUNTER — Encounter (HOSPITAL_COMMUNITY): Payer: Self-pay | Admitting: Anesthesiology

## 2012-07-31 ENCOUNTER — Inpatient Hospital Stay (HOSPITAL_COMMUNITY)
Admission: EM | Admit: 2012-07-31 | Discharge: 2012-09-01 | DRG: 957 | Disposition: A | Discharge status: Home / Self Care | Payer: 59 | Attending: General Surgery | Admitting: General Surgery

## 2012-07-31 ENCOUNTER — Encounter (HOSPITAL_COMMUNITY): Admission: EM | Disposition: A | Discharge status: Home / Self Care | Payer: Self-pay | Source: Home / Self Care

## 2012-07-31 DIAGNOSIS — F315 Bipolar disorder, current episode depressed, severe, with psychotic features: Secondary | ICD-10-CM | POA: Diagnosis present

## 2012-07-31 DIAGNOSIS — S31109A Unspecified open wound of abdominal wall, unspecified quadrant without penetration into peritoneal cavity, initial encounter: Secondary | ICD-10-CM | POA: Diagnosis not present

## 2012-07-31 DIAGNOSIS — J95821 Acute postprocedural respiratory failure: Secondary | ICD-10-CM | POA: Diagnosis present

## 2012-07-31 DIAGNOSIS — T148XXA Other injury of unspecified body region, initial encounter: Secondary | ICD-10-CM | POA: Diagnosis not present

## 2012-07-31 DIAGNOSIS — T8140XA Infection following a procedure, unspecified, initial encounter: Secondary | ICD-10-CM | POA: Diagnosis not present

## 2012-07-31 DIAGNOSIS — S37002A Unspecified injury of left kidney, initial encounter: Secondary | ICD-10-CM | POA: Diagnosis present

## 2012-07-31 DIAGNOSIS — I498 Other specified cardiac arrhythmias: Secondary | ICD-10-CM | POA: Diagnosis not present

## 2012-07-31 DIAGNOSIS — S2249XA Multiple fractures of ribs, unspecified side, initial encounter for closed fracture: Secondary | ICD-10-CM | POA: Diagnosis present

## 2012-07-31 DIAGNOSIS — F329 Major depressive disorder, single episode, unspecified: Secondary | ICD-10-CM | POA: Diagnosis present

## 2012-07-31 DIAGNOSIS — Q6239 Other obstructive defects of renal pelvis and ureter: Secondary | ICD-10-CM | POA: Diagnosis not present

## 2012-07-31 DIAGNOSIS — K8689 Other specified diseases of pancreas: Secondary | ICD-10-CM | POA: Diagnosis not present

## 2012-07-31 DIAGNOSIS — S37819A Unspecified injury of adrenal gland, initial encounter: Secondary | ICD-10-CM | POA: Diagnosis present

## 2012-07-31 DIAGNOSIS — Y836 Removal of other organ (partial) (total) as the cause of abnormal reaction of the patient, or of later complication, without mention of misadventure at the time of the procedure: Secondary | ICD-10-CM | POA: Diagnosis not present

## 2012-07-31 DIAGNOSIS — S36209A Unspecified injury of unspecified part of pancreas, initial encounter: Secondary | ICD-10-CM | POA: Diagnosis present

## 2012-07-31 DIAGNOSIS — X75XXXA Intentional self-harm by explosive material, initial encounter: Secondary | ICD-10-CM | POA: Diagnosis present

## 2012-07-31 DIAGNOSIS — S3600XA Unspecified injury of spleen, initial encounter: Secondary | ICD-10-CM | POA: Diagnosis present

## 2012-07-31 DIAGNOSIS — S36501A Unspecified injury of transverse colon, initial encounter: Principal | ICD-10-CM | POA: Diagnosis present

## 2012-07-31 DIAGNOSIS — E46 Unspecified protein-calorie malnutrition: Secondary | ICD-10-CM | POA: Diagnosis not present

## 2012-07-31 DIAGNOSIS — S37009A Unspecified injury of unspecified kidney, initial encounter: Secondary | ICD-10-CM | POA: Diagnosis present

## 2012-07-31 DIAGNOSIS — F333 Major depressive disorder, recurrent, severe with psychotic symptoms: Secondary | ICD-10-CM | POA: Diagnosis not present

## 2012-07-31 DIAGNOSIS — S3981XA Other specified injuries of abdomen, initial encounter: Secondary | ICD-10-CM | POA: Diagnosis not present

## 2012-07-31 DIAGNOSIS — F603 Borderline personality disorder: Secondary | ICD-10-CM | POA: Diagnosis present

## 2012-07-31 DIAGNOSIS — S36202A Unspecified injury of tail of pancreas, initial encounter: Secondary | ICD-10-CM | POA: Diagnosis present

## 2012-07-31 DIAGNOSIS — X838XXA Intentional self-harm by other specified means, initial encounter: Secondary | ICD-10-CM | POA: Diagnosis present

## 2012-07-31 DIAGNOSIS — I499 Cardiac arrhythmia, unspecified: Secondary | ICD-10-CM | POA: Diagnosis not present

## 2012-07-31 DIAGNOSIS — Z880 Allergy status to penicillin: Secondary | ICD-10-CM | POA: Diagnosis not present

## 2012-07-31 DIAGNOSIS — D62 Acute posthemorrhagic anemia: Secondary | ICD-10-CM | POA: Diagnosis not present

## 2012-07-31 DIAGNOSIS — I2699 Other pulmonary embolism without acute cor pulmonale: Secondary | ICD-10-CM | POA: Diagnosis not present

## 2012-07-31 DIAGNOSIS — S3681XA Injury of peritoneum, initial encounter: Secondary | ICD-10-CM | POA: Diagnosis present

## 2012-07-31 DIAGNOSIS — N200 Calculus of kidney: Secondary | ICD-10-CM | POA: Diagnosis present

## 2012-07-31 DIAGNOSIS — J9600 Acute respiratory failure, unspecified whether with hypoxia or hypercapnia: Secondary | ICD-10-CM | POA: Diagnosis not present

## 2012-07-31 DIAGNOSIS — K651 Peritoneal abscess: Secondary | ICD-10-CM | POA: Diagnosis not present

## 2012-07-31 DIAGNOSIS — S36899A Unspecified injury of other intra-abdominal organs, initial encounter: Secondary | ICD-10-CM | POA: Diagnosis present

## 2012-07-31 DIAGNOSIS — F319 Bipolar disorder, unspecified: Secondary | ICD-10-CM | POA: Diagnosis not present

## 2012-07-31 DIAGNOSIS — X749XXA Intentional self-harm by unspecified firearm discharge, initial encounter: Secondary | ICD-10-CM | POA: Diagnosis not present

## 2012-07-31 DIAGNOSIS — W3400XA Accidental discharge from unspecified firearms or gun, initial encounter: Secondary | ICD-10-CM

## 2012-07-31 DIAGNOSIS — M25569 Pain in unspecified knee: Secondary | ICD-10-CM | POA: Diagnosis not present

## 2012-07-31 DIAGNOSIS — S2242XA Multiple fractures of ribs, left side, initial encounter for closed fracture: Secondary | ICD-10-CM | POA: Diagnosis present

## 2012-07-31 DIAGNOSIS — S36509A Unspecified injury of unspecified part of colon, initial encounter: Secondary | ICD-10-CM

## 2012-07-31 DIAGNOSIS — Y249XXA Unspecified firearm discharge, undetermined intent, initial encounter: Secondary | ICD-10-CM

## 2012-07-31 HISTORY — PX: LAPAROTOMY: SHX154

## 2012-07-31 HISTORY — PX: COLOSTOMY: SHX63

## 2012-07-31 LAB — COMPREHENSIVE METABOLIC PANEL
ALT: 14 U/L (ref 0–35)
AST: 35 U/L (ref 0–37)
CO2: 19 mEq/L (ref 19–32)
Calcium: 7.4 mg/dL — ABNORMAL LOW (ref 8.4–10.5)
GFR calc non Af Amer: 72 mL/min — ABNORMAL LOW (ref >90)
Potassium: 3.9 mEq/L (ref 3.5–5.1)
Sodium: 139 mEq/L (ref 135–145)

## 2012-07-31 LAB — CBC
HCT: 29.5 % — ABNORMAL LOW (ref 36.0–46.0)
Hemoglobin: 9.9 g/dL — ABNORMAL LOW (ref 12.0–15.0)
MCH: 28 pg (ref 26.0–34.0)
MCHC: 33.6 g/dL (ref 30.0–36.0)
Platelets: 294 10*3/uL (ref 150–400)
RBC: 3.7 MIL/uL — ABNORMAL LOW (ref 3.87–5.11)
WBC: 10 10*3/uL (ref 4.0–10.5)

## 2012-07-31 LAB — LACTIC ACID, PLASMA: Lactic Acid, Venous: 2.6 mmol/L — ABNORMAL HIGH (ref 0.5–2.2)

## 2012-07-31 LAB — URINALYSIS, MICROSCOPIC ONLY
Glucose, UA: 100 mg/dL — AB
Ketones, ur: 40 mg/dL — AB
Protein, ur: >300 mg/dL — AB

## 2012-07-31 LAB — POCT I-STAT, CHEM 8
BUN: 7 mg/dL (ref 6–23)
Calcium, Ion: 1.15 mmol/L (ref 1.12–1.23)
Chloride: 108 mEq/L (ref 96–112)
Chloride: 109 mEq/L (ref 96–112)
Creatinine, Ser: 1.3 mg/dL — ABNORMAL HIGH (ref 0.50–1.10)
Glucose, Bld: 186 mg/dL — ABNORMAL HIGH (ref 70–99)
Hemoglobin: 15 g/dL (ref 12.0–15.0)
Potassium: 3.8 mEq/L (ref 3.5–5.1)
Sodium: 141 mEq/L (ref 135–145)
TCO2: 18 mmol/L (ref 0–100)

## 2012-07-31 LAB — CDS SEROLOGY

## 2012-07-31 LAB — PROTIME-INR: Prothrombin Time: 16 seconds — ABNORMAL HIGH (ref 11.6–15.2)

## 2012-07-31 LAB — MRSA PCR SCREENING: MRSA by PCR: NEGATIVE

## 2012-07-31 SURGERY — LAPAROTOMY, EXPLORATORY
Anesthesia: General | Site: Mouth | Laterality: Right | Wound class: Dirty or Infected

## 2012-07-31 MED ORDER — HYDROMORPHONE HCL PF 1 MG/ML IJ SOLN: 0.2500 mg | INTRAMUSCULAR | Status: DC | PRN

## 2012-07-31 MED ORDER — ALBUMIN HUMAN 5 % IV SOLN
INTRAVENOUS | Status: DC | PRN
  Administered 2012-07-31 (×2): via INTRAVENOUS

## 2012-07-31 MED ORDER — PANTOPRAZOLE SODIUM 40 MG IV SOLR
40.0000 mg | Freq: Every day | INTRAVENOUS | Status: DC
  Administered 2012-08-01 – 2012-08-04 (×4): 40 mg via INTRAVENOUS
  Filled 2012-07-31 (×10): qty 40

## 2012-07-31 MED ORDER — FENTANYL CITRATE 0.05 MG/ML IJ SOLN
INTRAMUSCULAR | Status: DC | PRN
  Administered 2012-07-31 (×5): 50 ug via INTRAVENOUS

## 2012-07-31 MED ORDER — HEMOSTATIC AGENTS (NO CHARGE) OPTIME
TOPICAL | Status: DC | PRN
  Administered 2012-07-31: 1 via TOPICAL

## 2012-07-31 MED ORDER — SODIUM CHLORIDE 0.9 % IV SOLN
2.0000 mg/h | INTRAVENOUS | Status: DC
  Administered 2012-07-31 (×2): 5 mg/h via INTRAVENOUS
  Administered 2012-08-01: 1.5 mg/h via INTRAVENOUS
  Filled 2012-07-31 (×3): qty 10

## 2012-07-31 MED ORDER — MIDAZOLAM BOLUS VIA INFUSION
1.0000 mg | INTRAVENOUS | Status: DC | PRN
  Filled 2012-07-31: qty 2

## 2012-07-31 MED ORDER — LORAZEPAM 2 MG/ML IJ SOLN: 1.0000 mg | Freq: Once | INTRAMUSCULAR | Status: DC | PRN

## 2012-07-31 MED ORDER — BIOTENE DRY MOUTH MT LIQD
15.0000 mL | Freq: Four times a day (QID) | OROMUCOSAL | Status: DC
  Administered 2012-08-01 – 2012-08-03 (×10): 15 mL via OROMUCOSAL

## 2012-07-31 MED ORDER — SODIUM CHLORIDE 0.9 % IV SOLN
50.0000 ug/h | INTRAVENOUS | Status: DC
  Administered 2012-07-31: 100 ug/h via INTRAVENOUS
  Administered 2012-08-01: 75 ug/h via INTRAVENOUS
  Filled 2012-07-31 (×2): qty 50

## 2012-07-31 MED ORDER — ROCURONIUM BROMIDE 100 MG/10ML IV SOLN
INTRAVENOUS | Status: DC | PRN
  Administered 2012-07-31: 50 mg via INTRAVENOUS

## 2012-07-31 MED ORDER — MIDAZOLAM HCL 2 MG/2ML IJ SOLN
INTRAMUSCULAR | Status: AC
  Administered 2012-07-31: 2 mg
  Filled 2012-07-31: qty 2

## 2012-07-31 MED ORDER — FENTANYL BOLUS VIA INFUSION
50.0000 ug | Freq: Four times a day (QID) | INTRAVENOUS | Status: DC | PRN
  Filled 2012-07-31: qty 100

## 2012-07-31 MED ORDER — CEFAZOLIN SODIUM 1-5 GM-% IV SOLN
INTRAVENOUS | Status: DC | PRN
  Administered 2012-07-31: 2 g via INTRAVENOUS

## 2012-07-31 MED ORDER — ONDANSETRON HCL 4 MG/2ML IJ SOLN
4.0000 mg | INTRAMUSCULAR | Status: DC | PRN
  Administered 2012-08-10 – 2012-08-22 (×2): 4 mg via INTRAVENOUS
  Filled 2012-07-31 (×2): qty 2

## 2012-07-31 MED ORDER — IOHEXOL 300 MG/ML  SOLN
100.0000 mL | Freq: Once | INTRAMUSCULAR | Status: AC | PRN
  Administered 2012-07-31: 100 mL via INTRAVENOUS

## 2012-07-31 MED ORDER — DEXMEDETOMIDINE HCL IN NACL 200 MCG/50ML IV SOLN
0.4000 ug/kg/h | INTRAVENOUS | Status: DC
  Filled 2012-07-31: qty 50

## 2012-07-31 MED ORDER — CHLORHEXIDINE GLUCONATE 0.12 % MT SOLN
OROMUCOSAL | Status: AC
  Administered 2012-07-31: 15 mL via OROMUCOSAL
  Filled 2012-07-31: qty 15

## 2012-07-31 MED ORDER — PROPOFOL 10 MG/ML IV EMUL
INTRAVENOUS | Status: DC | PRN
  Administered 2012-07-31: 100 mg via INTRAVENOUS

## 2012-07-31 MED ORDER — 0.9 % SODIUM CHLORIDE (POUR BTL) OPTIME
TOPICAL | Status: DC | PRN
  Administered 2012-07-31 (×3): 1000 mL

## 2012-07-31 MED ORDER — SUCCINYLCHOLINE CHLORIDE 20 MG/ML IJ SOLN
INTRAMUSCULAR | Status: DC | PRN
  Administered 2012-07-31: 120 mg via INTRAVENOUS

## 2012-07-31 MED ORDER — POTASSIUM CHLORIDE IN NACL 20-0.45 MEQ/L-% IV SOLN
INTRAVENOUS | Status: DC
  Administered 2012-07-31 – 2012-08-01 (×2): 125 mL/h via INTRAVENOUS
  Administered 2012-08-01 (×2): via INTRAVENOUS
  Administered 2012-08-01: 125 mL/h via INTRAVENOUS
  Administered 2012-08-02 – 2012-08-03 (×4): via INTRAVENOUS
  Administered 2012-08-04: 50 mL via INTRAVENOUS
  Administered 2012-08-04 – 2012-08-08 (×4): via INTRAVENOUS
  Filled 2012-07-31 (×23): qty 1000

## 2012-07-31 MED ORDER — LIDOCAINE HCL (CARDIAC) 20 MG/ML IV SOLN
INTRAVENOUS | Status: DC | PRN
  Administered 2012-07-31: 100 mg via INTRAVENOUS

## 2012-07-31 MED ORDER — PROMETHAZINE HCL 25 MG/ML IJ SOLN: 6.2500 mg | INTRAMUSCULAR | Status: DC | PRN

## 2012-07-31 MED ORDER — LACTATED RINGERS IV SOLN
INTRAVENOUS | Status: DC | PRN
  Administered 2012-07-31 (×2): via INTRAVENOUS

## 2012-07-31 MED ORDER — FENTANYL CITRATE 0.05 MG/ML IJ SOLN
INTRAMUSCULAR | Status: AC
  Filled 2012-07-31: qty 2

## 2012-07-31 MED ORDER — HETASTARCH-ELECTROLYTES 6 % IV SOLN
INTRAVENOUS | Status: DC | PRN
  Administered 2012-07-31: 14:00:00 via INTRAVENOUS

## 2012-07-31 MED ORDER — CHLORHEXIDINE GLUCONATE 0.12 % MT SOLN
15.0000 mL | Freq: Two times a day (BID) | OROMUCOSAL | Status: DC
  Administered 2012-07-31 – 2012-08-02 (×5): 15 mL via OROMUCOSAL
  Filled 2012-07-31 (×3): qty 15

## 2012-07-31 MED ORDER — PANTOPRAZOLE SODIUM 40 MG PO TBEC
40.0000 mg | DELAYED_RELEASE_TABLET | Freq: Every day | ORAL | Status: DC
  Administered 2012-08-05 – 2012-08-09 (×5): 40 mg via ORAL
  Filled 2012-07-31 (×6): qty 1

## 2012-07-31 MED ORDER — VECURONIUM BROMIDE 10 MG IV SOLR
INTRAVENOUS | Status: DC | PRN
  Administered 2012-07-31: 5 mg via INTRAVENOUS

## 2012-07-31 MED ORDER — MIDAZOLAM HCL 5 MG/5ML IJ SOLN
INTRAMUSCULAR | Status: DC | PRN
  Administered 2012-07-31: 2 mg via INTRAVENOUS

## 2012-07-31 MED ORDER — FENTANYL CITRATE 0.05 MG/ML IJ SOLN
INTRAMUSCULAR | Status: AC | PRN
  Administered 2012-07-31: 50 ug via INTRAVENOUS

## 2012-07-31 MED ORDER — FENTANYL CITRATE 0.05 MG/ML IJ SOLN
INTRAMUSCULAR | Status: AC
  Administered 2012-07-31: 100 ug
  Filled 2012-07-31: qty 2

## 2012-07-31 SURGICAL SUPPLY — 59 items
BANDAGE GAUZE ELAST BULKY 4 IN (GAUZE/BANDAGES/DRESSINGS) ×4 IMPLANT
BLADE SURG ROTATE 9660 (MISCELLANEOUS) IMPLANT
CANISTER SUCTION 2500CC (MISCELLANEOUS) ×8 IMPLANT
CLIP TI MEDIUM 24 (CLIP) ×4 IMPLANT
CLOTH BEACON ORANGE TIMEOUT ST (SAFETY) ×4 IMPLANT
COVER SURGICAL LIGHT HANDLE (MISCELLANEOUS) ×4 IMPLANT
DRAIN CHANNEL 19F RND (DRAIN) ×8 IMPLANT
DRAPE LAPAROSCOPIC ABDOMINAL (DRAPES) ×4 IMPLANT
DRAPE WARM FLUID 44X44 (DRAPE) ×4 IMPLANT
DRSG PAD ABDOMINAL 8X10 ST (GAUZE/BANDAGES/DRESSINGS) ×4 IMPLANT
ELECT REM PT RETURN 9FT ADLT (ELECTROSURGICAL) ×4
ELECTRODE REM PT RTRN 9FT ADLT (ELECTROSURGICAL) ×3 IMPLANT
EVACUATOR SILICONE 100CC (DRAIN) ×8 IMPLANT
GLOVE BIO SURGEON STRL SZ7 (GLOVE) ×4 IMPLANT
GLOVE BIO SURGEON STRL SZ8 (GLOVE) ×4 IMPLANT
GLOVE BIOGEL PI IND STRL 6.5 (GLOVE) ×3 IMPLANT
GLOVE BIOGEL PI IND STRL 7.5 (GLOVE) ×6 IMPLANT
GLOVE BIOGEL PI IND STRL 8 (GLOVE) ×3 IMPLANT
GLOVE BIOGEL PI INDICATOR 6.5 (GLOVE) ×1
GLOVE BIOGEL PI INDICATOR 7.5 (GLOVE) ×2
GLOVE BIOGEL PI INDICATOR 8 (GLOVE) ×1
GLOVE ECLIPSE 7.5 STRL STRAW (GLOVE) ×4 IMPLANT
GLOVE SKINSENSE NS SZ7.5 (GLOVE) ×1
GLOVE SKINSENSE STRL SZ7.5 (GLOVE) ×3 IMPLANT
GLOVE SS BIOGEL STRL SZ 7.5 (GLOVE) ×3 IMPLANT
GLOVE SUPERSENSE BIOGEL SZ 7.5 (GLOVE) ×1
GLOVE SURG SIGNA 7.5 PF LTX (GLOVE) ×8 IMPLANT
GLOVE SURG SS PI 6.5 STRL IVOR (GLOVE) ×4 IMPLANT
GOWN PREVENTION PLUS XLARGE (GOWN DISPOSABLE) ×12 IMPLANT
GOWN STRL NON-REIN LRG LVL3 (GOWN DISPOSABLE) ×16 IMPLANT
HEMOSTAT SNOW SURGICEL 2X4 (HEMOSTASIS) ×4 IMPLANT
KIT BASIN OR (CUSTOM PROCEDURE TRAY) ×4 IMPLANT
KIT COLOSTOMY ILEOSTOMY 2.75 (WOUND CARE) ×4 IMPLANT
KIT ROOM TURNOVER OR (KITS) ×4 IMPLANT
LIGASURE IMPACT 36 18CM CVD LR (INSTRUMENTS) ×4 IMPLANT
NS IRRIG 1000ML POUR BTL (IV SOLUTION) ×12 IMPLANT
PACK GENERAL/GYN (CUSTOM PROCEDURE TRAY) ×4 IMPLANT
PAD ARMBOARD 7.5X6 YLW CONV (MISCELLANEOUS) ×4 IMPLANT
RELOAD PROXIMATE 75MM BLUE (ENDOMECHANICALS) ×12 IMPLANT
SPECIMEN JAR LARGE (MISCELLANEOUS) IMPLANT
SPONGE GAUZE 4X4 12PLY (GAUZE/BANDAGES/DRESSINGS) ×4 IMPLANT
SPONGE LAP 18X18 X RAY DECT (DISPOSABLE) ×16 IMPLANT
STAPLER PROXIMATE 75MM BLUE (STAPLE) ×4 IMPLANT
STAPLER VISISTAT 35W (STAPLE) ×4 IMPLANT
SUCTION POOLE TIP (SUCTIONS) ×4 IMPLANT
SUT ETHILON 2 0 FS 18 (SUTURE) ×8 IMPLANT
SUT PDS AB 1 TP1 96 (SUTURE) ×8 IMPLANT
SUT SILK 2 0 SH CR/8 (SUTURE) ×4 IMPLANT
SUT SILK 2 0 TIES 10X30 (SUTURE) ×4 IMPLANT
SUT SILK 3 0 SH CR/8 (SUTURE) ×4 IMPLANT
SUT SILK 3 0 TIES 10X30 (SUTURE) ×4 IMPLANT
SUT VIC AB 3-0 SH 18 (SUTURE) ×4 IMPLANT
TOWEL OR 17X24 6PK STRL BLUE (TOWEL DISPOSABLE) IMPLANT
TOWEL OR 17X26 10 PK STRL BLUE (TOWEL DISPOSABLE) ×8 IMPLANT
TRAY FOLEY CATH 14FRSI W/METER (CATHETERS) IMPLANT
TRAY FOLEY IC TEMP SENS 14FR (CATHETERS) ×4 IMPLANT
TUBE CONNECTING 12X1/4 (SUCTIONS) ×4 IMPLANT
WATER STERILE IRR 1000ML POUR (IV SOLUTION) IMPLANT
YANKAUER SUCT BULB TIP NO VENT (SUCTIONS) ×4 IMPLANT
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
## 2012-07-31 NOTE — ED Notes (Signed)
Pt log rolled off of board with c-spine precautions maintained.
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
## 2012-07-31 NOTE — Anesthesia Postprocedure Evaluation (Signed)
  Anesthesia Post-op Note  Patient: [name] Setting  Procedure(s) Performed: Procedure(s) (LRB): EXPLORATORY LAPAROTOMY (N/A) UPPER GI ENDOSCOPY (N/A) TRANSVERSE COLON RESECTION (N/A) COLOSTOMY (Right)  Patient Location: SICU  Anesthesia Type: General  Level of Consciousness: sedated  Airway and Oxygen Therapy: Patient remains intubated per anesthesia plan and Patient placed on Ventilator (see vital sign flow sheet for setting)  Post-op Pain: none  Post-op Assessment: Post-op Vital signs reviewed, Patient's Cardiovascular Status Stable, Respiratory Function Stable and Patent Airway  Post-op Vital Signs: Reviewed and stable  Complications: No apparent anesthesia complications
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
## 2012-07-31 NOTE — Transfer of Care (Signed)
Immediate Anesthesia Transfer of Care Note  Patient: [name] [name]  Procedure(s) Performed: Procedure(s) (LRB): EXPLORATORY LAPAROTOMY (N/A) UPPER GI ENDOSCOPY (N/A) TRANSVERSE COLON RESECTION (N/A) COLOSTOMY (Right)  Patient Location: SICU  Anesthesia Type: General  Level of Consciousness: sedated, unresponsive and patient cooperative  Airway & Oxygen Therapy: Patient remains intubated per anesthesia plan and Patient placed on Ventilator (see vital sign flow sheet for [name])  Post-op Assessment: Report given to PACU RN and Post -op Vital signs reviewed and stable  Post vital signs: Reviewed and stable  Complications: No apparent anesthesia complications
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
## 2012-07-31 NOTE — Anesthesia Procedure Notes (Signed)
Procedure Name: Intubation Date/Time: 07/31/2012 1:02 PM Performed by: Rogelia Boga Pre-anesthesia Checklist: Patient identified, Emergency Drugs available, Suction available, Patient being monitored and Timeout performed Patient Re-evaluated:Patient Re-evaluated prior to inductionOxygen Delivery Method: Circle system utilized Preoxygenation: Pre-oxygenation with 100% oxygen Intubation Type: IV induction, Rapid sequence and Cricoid Pressure applied Laryngoscope Size: Mac and 4 Grade View: Grade I Tube type: Oral Tube size: 7.0 mm Number of attempts: 1 Airway Equipment and Method: Stylet Placement Confirmation: ETT inserted through vocal cords under direct vision,  positive ETCO2 and breath sounds checked- equal and bilateral Secured at: 21 cm Tube secured with: Tape Dental Injury: Teeth and Oropharynx as per pre-operative assessment
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
## 2012-07-31 NOTE — Anesthesia Postprocedure Evaluation (Signed)
  Anesthesia Post-op Note  Patient: [name] [name]  Procedure(s) Performed: Procedure(s) (LRB): EXPLORATORY LAPAROTOMY (N/A) UPPER GI ENDOSCOPY (N/A) TRANSVERSE COLON RESECTION (N/A) COLOSTOMY (Right)  Patient Location: ICU  Anesthesia Type: General  Level of Consciousness: Patient remains intubated per anesthesia plan  Airway and Oxygen Therapy: Patient remains intubated per anesthesia plan and Patient placed on Ventilator (see vital sign flow sheet for [name])  Post-op Pain: none  Post-op Assessment: Post-op Vital signs reviewed, Patient's Cardiovascular Status Stable, Respiratory Function Stable, Patent Airway, No signs of Nausea or vomiting and Pain level controlled  Post-op Vital Signs: stable  Complications: No apparent anesthesia complications
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
## 2012-08-01 ENCOUNTER — Encounter (HOSPITAL_COMMUNITY): Payer: Self-pay | Admitting: *Deleted

## 2012-08-01 ENCOUNTER — Inpatient Hospital Stay (HOSPITAL_COMMUNITY): Payer: 59

## 2012-08-01 DIAGNOSIS — F329 Major depressive disorder, single episode, unspecified: Secondary | ICD-10-CM | POA: Diagnosis present

## 2012-08-01 DIAGNOSIS — S2242XA Multiple fractures of ribs, left side, initial encounter for closed fracture: Secondary | ICD-10-CM | POA: Diagnosis present

## 2012-08-01 DIAGNOSIS — R000 Tachycardia, unspecified: Secondary | ICD-10-CM

## 2012-08-01 DIAGNOSIS — S36501A Unspecified injury of transverse colon, initial encounter: Principal | ICD-10-CM | POA: Diagnosis present

## 2012-08-01 DIAGNOSIS — J95821 Acute postprocedural respiratory failure: Secondary | ICD-10-CM

## 2012-08-01 DIAGNOSIS — Y249XXA Unspecified firearm discharge, undetermined intent, initial encounter: Secondary | ICD-10-CM

## 2012-08-01 DIAGNOSIS — S37002A Unspecified injury of left kidney, initial encounter: Secondary | ICD-10-CM | POA: Diagnosis present

## 2012-08-01 DIAGNOSIS — S3600XA Unspecified injury of spleen, initial encounter: Secondary | ICD-10-CM | POA: Diagnosis present

## 2012-08-01 DIAGNOSIS — D62 Acute posthemorrhagic anemia: Secondary | ICD-10-CM | POA: Diagnosis not present

## 2012-08-01 LAB — CBC
HCT: 28 % — ABNORMAL LOW (ref 36.0–46.0)
MCV: 83.3 fL (ref 78.0–100.0)
Platelets: 277 10*3/uL (ref 150–400)
RBC: 3.36 MIL/uL — ABNORMAL LOW (ref 3.87–5.11)
WBC: 15.4 10*3/uL — ABNORMAL HIGH (ref 4.0–10.5)

## 2012-08-01 LAB — POCT I-STAT 7, (LYTES, BLD GAS, ICA,H+H)
Acid-base deficit: 3 mmol/L — ABNORMAL HIGH (ref 0.0–2.0)
Bicarbonate: 21.1 mEq/L (ref 20.0–24.0)
O2 Saturation: 100 %
Sodium: 139 mEq/L (ref 135–145)
TCO2: 22 mmol/L (ref 0–100)

## 2012-08-01 LAB — BASIC METABOLIC PANEL
CO2: 22 mEq/L (ref 19–32)
Chloride: 109 mEq/L (ref 96–112)
Creatinine, Ser: 0.99 mg/dL (ref 0.50–1.10)
Potassium: 4.5 mEq/L (ref 3.5–5.1)

## 2012-08-01 LAB — PROTIME-INR: Prothrombin Time: 16.2 seconds — ABNORMAL HIGH (ref 11.6–15.2)

## 2012-08-01 MED ORDER — SODIUM CHLORIDE 0.9 % IJ SOLN: 9.0000 mL | INTRAMUSCULAR | Status: DC | PRN

## 2012-08-01 MED ORDER — MORPHINE SULFATE (PF) 1 MG/ML IV SOLN
INTRAVENOUS | Status: DC
  Administered 2012-08-01: 9 mg via INTRAVENOUS
  Administered 2012-08-01 – 2012-08-02 (×3): via INTRAVENOUS
  Filled 2012-08-01 (×3): qty 25

## 2012-08-01 MED ORDER — LACTATED RINGERS IV BOLUS (SEPSIS)
1000.0000 mL | Freq: Once | INTRAVENOUS | Status: AC
  Administered 2012-08-01: 1000 mL via INTRAVENOUS

## 2012-08-01 MED ORDER — DIPHENHYDRAMINE HCL 50 MG/ML IJ SOLN
12.5000 mg | Freq: Four times a day (QID) | INTRAMUSCULAR | Status: DC | PRN
  Administered 2012-08-17: 12.5 mg via INTRAVENOUS
  Filled 2012-08-01: qty 1

## 2012-08-01 MED ORDER — NALOXONE HCL 0.4 MG/ML IJ SOLN
0.4000 mg | INTRAMUSCULAR | Status: DC | PRN
  Administered 2012-08-02: 0.08 mg via INTRAVENOUS
  Filled 2012-08-01 (×2): qty 1

## 2012-08-01 MED ORDER — DIPHENHYDRAMINE HCL 12.5 MG/5ML PO ELIX
12.5000 mg | ORAL_SOLUTION | Freq: Four times a day (QID) | ORAL | Status: DC | PRN
  Filled 2012-08-01: qty 5
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
## 2012-08-01 NOTE — Progress Notes (Signed)
Paged Dr. Donell Beers for HR >120 (maintaining in the 120s currently) and systolic BP <95 (maintaining in the upper 80s). Urine output decreasing over past few hours and is now 21ml/hr. Patient comfortable and able to follow commands. Order received to give 1,014ml of LR at this time. Will continue to monitor.

## 2012-08-01 NOTE — Procedures (Signed)
Extubation Procedure Note  Patient Details:   Name: [name] [name] DOB: 29-Dec-1970 MRN: [id number]   Airway Documentation:  Airway 7.5 mm (Active)  Secured at (cm) 23 cm 08/01/2012  9:54 AM  Measured From Lips 08/01/2012  9:54 AM  Secured Location Left 08/01/2012  9:54 AM  Secured By [employer] 08/01/2012  9:54 AM  Tube Holder Repositioned Yes 08/01/2012  8:33 AM  Cuff Pressure (cm H2O) 24 cm H2O 07/31/2012  8:23 PM  Site Condition Dry 08/01/2012  9:54 AM    Evaluation  O2 sats: stable throughout Complications: No apparent complications Patient did tolerate procedure well. Bilateral Breath Sounds: Clear Suctioning: Airway Yes  RT extubated patient per MD order.  Patient had a NIF -30 and a Vital Capacity 850. Patient able to follow simple commands, could squeeze hands, wiggle toes, and stick tongue out. Patient only had a Minimal cuff leak but after paging Dale Amesti, PA he still wanted to extubate patient. RT cleaned out patients airway and suctioned above cuff well.  Post extubation patient was able to state her name and cough.  BBS are clear and diminished. NO stridor noted at this time.  HR 136 RR 20 Sats 100% on 4L [place].  RT will continue to monitor patient. Gabriela Eves 08/01/2012, 11:17 AM

## 2012-08-01 NOTE — Progress Notes (Signed)
Paged Dr. Donell Beers due to cuff BP of 80s/40s (MAP 50s). Previous LR bolus effective until this point. AM labs results given to MD: Hgb 9.4, Hct 28, & K 4.5. Order received for another LR bolus at this time. Will continue to monitor.
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
## 2012-08-01 NOTE — Progress Notes (Signed)
UR completed 

## 2012-08-01 NOTE — Consult Note (Signed)
WOC ostomy consult  Stoma type/location: Colostomy performed 8/5 Stomal assessment/size: Stoma red and viable, above skin level, 13/4 inches.   Peristomal assessment:  Pouch intact with good seal, not removed at this time since it is first post-op day. Output 50cc blood-tinged drainage, no stool or flatus. Ostomy pouching:2pc.  Education provided: Pt not responsive at this time, on ventilator.  No family members at bedside.  Educational materials left at bedside.  Supplies ordered for bedside nurses.  Will begin teaching sessions when stable and out of ICU.   Cammie Mcgee, RN, MSN, [employer]  [phone]

## 2012-08-02 ENCOUNTER — Inpatient Hospital Stay (HOSPITAL_COMMUNITY): Payer: 59

## 2012-08-02 ENCOUNTER — Ambulatory Visit (INDEPENDENT_AMBULATORY_CARE_PROVIDER_SITE_OTHER): Payer: 59 | Admitting: General Surgery

## 2012-08-02 DIAGNOSIS — I10 Essential (primary) hypertension: Secondary | ICD-10-CM

## 2012-08-02 DIAGNOSIS — X838XXA Intentional self-harm by other specified means, initial encounter: Secondary | ICD-10-CM | POA: Diagnosis present

## 2012-08-02 DIAGNOSIS — E861 Hypovolemia: Secondary | ICD-10-CM

## 2012-08-02 LAB — CBC
HCT: 24.3 % — ABNORMAL LOW (ref 36.0–46.0)
Hemoglobin: 8.2 g/dL — ABNORMAL LOW (ref 12.0–15.0)
MCH: 28.3 pg (ref 26.0–34.0)
MCV: 83.8 fL (ref 78.0–100.0)
RBC: 2.9 MIL/uL — ABNORMAL LOW (ref 3.87–5.11)
WBC: 17.4 10*3/uL — ABNORMAL HIGH (ref 4.0–10.5)

## 2012-08-02 MED ORDER — MORPHINE SULFATE (PF) 1 MG/ML IV SOLN
INTRAVENOUS | Status: DC
  Administered 2012-08-02: 5 mg via INTRAVENOUS
  Administered 2012-08-02: 21:00:00 via INTRAVENOUS
  Administered 2012-08-03: 2 mg via INTRAVENOUS
  Administered 2012-08-03: 13 mg via INTRAVENOUS
  Administered 2012-08-03: 18:00:00 via INTRAVENOUS
  Administered 2012-08-03 (×3): 8 mg via INTRAVENOUS
  Administered 2012-08-03: 10 mg via INTRAVENOUS
  Administered 2012-08-04: 9 mg via INTRAVENOUS
  Administered 2012-08-04 (×2): 12 mg via INTRAVENOUS
  Administered 2012-08-05: 1 mg via INTRAVENOUS
  Administered 2012-08-05: 4 mg via INTRAVENOUS
  Administered 2012-08-05: 21 mg via INTRAVENOUS
  Administered 2012-08-05: 06:00:00 via INTRAVENOUS
  Administered 2012-08-06: 5 mg via INTRAVENOUS
  Administered 2012-08-06: 02:00:00 via INTRAVENOUS
  Administered 2012-08-06: 6 mg via INTRAVENOUS
  Administered 2012-08-06: 12 mg via INTRAVENOUS
  Filled 2012-08-02 (×8): qty 25

## 2012-08-02 MED ORDER — ALBUMIN HUMAN 5 % IV SOLN
25.0000 g | Freq: Once | INTRAVENOUS | Status: AC
  Administered 2012-08-02: 25 g via INTRAVENOUS
  Filled 2012-08-02: qty 250

## 2012-08-02 MED ORDER — ALBUMIN HUMAN 5 % IV SOLN
INTRAVENOUS | Status: AC
  Filled 2012-08-02: qty 250
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
## 2012-08-02 NOTE — Progress Notes (Signed)
Pt unresponsive, respirations shallow.  Sao2 75%. Pt receiving full strength PCA Mso4. Action:  Pt bagged with 100% O2.  Narcan .08 mg administered per prn orders.  Trauma MD notified.  Response:  Pt awakened with Narcan administration.  Respirations regular.  PCA changed to low strength dose of 1mg .

## 2012-08-03 DIAGNOSIS — F3189 Other bipolar disorder: Secondary | ICD-10-CM

## 2012-08-03 DIAGNOSIS — F05 Delirium due to known physiological condition: Secondary | ICD-10-CM

## 2012-08-03 DIAGNOSIS — D62 Acute posthemorrhagic anemia: Secondary | ICD-10-CM

## 2012-08-03 LAB — CBC WITH DIFFERENTIAL/PLATELET
Basophils Absolute: 0 10*3/uL (ref 0.0–0.1)
Eosinophils Relative: 0 % (ref 0–5)
HCT: 21.6 % — ABNORMAL LOW (ref 36.0–46.0)
Hemoglobin: 7.2 g/dL — ABNORMAL LOW (ref 12.0–15.0)
Lymphocytes Relative: 6 % — ABNORMAL LOW (ref 12–46)
Lymphs Abs: 0.9 10*3/uL (ref 0.7–4.0)
MCV: 84.4 fL (ref 78.0–100.0)
Monocytes Absolute: 1.6 10*3/uL — ABNORMAL HIGH (ref 0.1–1.0)
Monocytes Relative: 10 % (ref 3–12)
Neutro Abs: 13.9 10*3/uL — ABNORMAL HIGH (ref 1.7–7.7)
RBC: 2.56 MIL/uL — ABNORMAL LOW (ref 3.87–5.11)
RDW: 14.6 % (ref 11.5–15.5)
WBC: 16.5 10*3/uL — ABNORMAL HIGH (ref 4.0–10.5)

## 2012-08-03 LAB — GLUCOSE, CAPILLARY: Glucose-Capillary: 90 mg/dL (ref 70–99)

## 2012-08-03 LAB — BASIC METABOLIC PANEL
CO2: 24 mEq/L (ref 19–32)
Calcium: 8 mg/dL — ABNORMAL LOW (ref 8.4–10.5)
Chloride: 106 mEq/L (ref 96–112)
Creatinine, Ser: 0.87 mg/dL (ref 0.50–1.10)
Glucose, Bld: 102 mg/dL — ABNORMAL HIGH (ref 70–99)

## 2012-08-03 MED ORDER — LORAZEPAM 2 MG/ML IJ SOLN
1.0000 mg | INTRAMUSCULAR | Status: DC | PRN
  Administered 2012-08-08 – 2012-08-26 (×30): 1 mg via INTRAVENOUS
  Filled 2012-08-03 (×2): qty 1
  Filled 2012-08-03: qty 2
  Filled 2012-08-03 (×30): qty 1

## 2012-08-03 NOTE — Consult Note (Signed)
Clinical Social Work Progress Note PSYCHIATRY SERVICE LINE 08/03/2012  Patient:  [name] [name]  Account:  [id number]  Admit Date:  07/31/2012  Clinical Social Worker:  Ashley Jacobs, LCSW  Date/Time:  08/03/2012 08:07 AM  Review of Patient  Overall Medical Condition:   Patient seen this am with husband at the bedside to get more accurate reflection on current behaviors and reactions to husbands presents.  Patient was very alert, tearful, and moaning in pain.  She was able to identify her husband, reason for admission and that she currently was in the hospital. Patient had to be redirected several times to calm down, as she works herself up very frequently. Patient was very appologetic about the event, reporting she had to make a fast decision and she is very sorry for what she did and how she hurt her family.  Patient has not yet disclosed the full story as to her triggers and intent behind the self inflicted wound. Will revist patient again this afternoon in hopes patient will be more calm to talk and work through the event and emotions causing her to be very anxious.   Participation Level:  Active  Participation Quality  Attentive   Other Participation Quality:   Difficult at times to calm patient down. Husband was very helpful to redirect patient and provide support.  Patient makes good eye contact, is tearful and appropriate regarding situation.  Patient has a lot of pain and anxiety which is a barrier to complete full assessment.   Affect  Anxious   Cognitive  Appropriate   Reaction to Medications/Concerns:   Will disucss with Psych Md regarding medications  patient has been on a number of medications   Modes of Intervention  Exploration  Support  Orientation/Capacity  Behaviors/Psychosis   Summary of Progress/Plan at Discharge   Still working to understand patient's intent behind hurting herself, any SI present at this time.  Will follow up with patient this afternoon  with regards to assessment.  Will continue to provide support to family and patient.  Unclear of discharge plan at this time.    Ashley Jacobs, MSW LCSW [phone]
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
## 2012-08-03 NOTE — Consult Note (Signed)
WOC ostomy consult  Stoma type/location: RUQ colostomy Stomal assessment/size: Red and moist, above skin level, 1 3/4" in diameter.  Peristomal assessment: Intact Output: No stool or flatus Ostomy pouching: 2pc.  Education provided: Therapist, art. Explained to patient the purpose of colostomy. Pt does not appear to be able to understand the teaching. Educational material left at bedside, no family present for teaching session. Will resume teaching when stable on another floor.    Cammie Mcgee, RN, MSN, [employer]  [phone]

## 2012-08-04 ENCOUNTER — Ambulatory Visit (INDEPENDENT_AMBULATORY_CARE_PROVIDER_SITE_OTHER): Payer: 59 | Admitting: General Surgery

## 2012-08-04 LAB — TYPE AND SCREEN
ABO/RH(D): O NEG
Unit division: 0
Unit division: 0
Unit division: 0
Unit division: 0

## 2012-08-04 LAB — CBC WITH DIFFERENTIAL/PLATELET
Basophils Relative: 0 % (ref 0–1)
Eosinophils Relative: 1 % (ref 0–5)
HCT: 21.4 % — ABNORMAL LOW (ref 36.0–46.0)
Hemoglobin: 7 g/dL — ABNORMAL LOW (ref 12.0–15.0)
Lymphocytes Relative: 8 % — ABNORMAL LOW (ref 12–46)
MCHC: 32.7 g/dL (ref 30.0–36.0)
MCV: 83.9 fL (ref 78.0–100.0)
Monocytes Absolute: 1.8 10*3/uL — ABNORMAL HIGH (ref 0.1–1.0)
Monocytes Relative: 11 % (ref 3–12)
Neutro Abs: 12.6 10*3/uL — ABNORMAL HIGH (ref 1.7–7.7)
RDW: 14.6 % (ref 11.5–15.5)

## 2012-08-04 LAB — BASIC METABOLIC PANEL
BUN: 6 mg/dL (ref 6–23)
CO2: 25 mEq/L (ref 19–32)
Calcium: 7.8 mg/dL — ABNORMAL LOW (ref 8.4–10.5)
Chloride: 102 mEq/L (ref 96–112)
Creatinine, Ser: 0.85 mg/dL (ref 0.50–1.10)

## 2012-08-04 MED ORDER — ASENAPINE MALEATE 5 MG SL SUBL
5.0000 mg | SUBLINGUAL_TABLET | Freq: Two times a day (BID) | SUBLINGUAL | Status: DC
  Administered 2012-08-04 – 2012-08-09 (×11): 5 mg via SUBLINGUAL
  Filled 2012-08-04 (×14): qty 1

## 2012-08-04 MED ORDER — RISPERIDONE 3 MG PO TABS
3.0000 mg | ORAL_TABLET | Freq: Two times a day (BID) | ORAL | Status: DC
  Administered 2012-08-05 – 2012-08-09 (×10): 3 mg via ORAL
  Filled 2012-08-04 (×15): qty 1

## 2012-08-04 MED ORDER — TRAZODONE HCL 100 MG PO TABS
250.0000 mg | ORAL_TABLET | Freq: Every day | ORAL | Status: DC
  Filled 2012-08-04: qty 3

## 2012-08-04 MED ORDER — TRAZODONE HCL 150 MG PO TABS
300.0000 mg | ORAL_TABLET | Freq: Every day | ORAL | Status: DC
  Administered 2012-08-04 – 2012-08-09 (×6): 300 mg via ORAL
  Filled 2012-08-04 (×8): qty 2

## 2012-08-04 MED ORDER — HYDROXYZINE HCL 50 MG PO TABS
50.0000 mg | ORAL_TABLET | Freq: Three times a day (TID) | ORAL | Status: DC | PRN
  Administered 2012-08-07 – 2012-08-28 (×5): 50 mg via ORAL
  Filled 2012-08-04 (×5): qty 1

## 2012-08-04 MED ORDER — PRO-STAT SUGAR FREE PO LIQD
30.0000 mL | Freq: Three times a day (TID) | ORAL | Status: DC
  Administered 2012-08-06 – 2012-08-12 (×19): 30 mL via ORAL
  Filled 2012-08-04 (×39): qty 30

## 2012-08-04 MED ORDER — FLUOXETINE HCL 20 MG PO CAPS
20.0000 mg | ORAL_CAPSULE | Freq: Every day | ORAL | Status: DC
  Administered 2012-08-04 – 2012-08-09 (×6): 20 mg via ORAL
  Filled 2012-08-04 (×7): qty 1

## 2012-08-04 MED ORDER — BOOST / RESOURCE BREEZE PO LIQD
1.0000 | Freq: Three times a day (TID) | ORAL | Status: DC
  Administered 2012-08-04 – 2012-08-12 (×16): 1 via ORAL
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
## 2012-08-04 NOTE — Progress Notes (Signed)
UR complete 

## 2012-08-04 NOTE — Progress Notes (Signed)
Occupational Therapy Treatment Patient Details Name: [name] [name] MRN: [id number] DOB: [birth year] Today's Date: 08/04/2012 Time: 2130-8657 OT Time Calculation (min): 35 min  OT Assessment / Plan / Recommendation Comments on Treatment Session Pt much more engaged and talkative today. Pt remains very "childlike" and needing step by step instructions for most tasks.    Follow Up Recommendations  Inpatient Rehab    Barriers to Discharge       Equipment Recommendations  Defer to next venue    Recommendations for Other Services Rehab consult  Frequency Min 2X/week   Plan Discharge plan remains appropriate    Precautions / Restrictions Precautions Precautions: Fall Precaution Comments: colostomy, decreased sustained attention Restrictions Weight Bearing Restrictions: No   Pertinent Vitals/Pain Pt with c/o abdominal pain.  HR up to 145 w activity but recovers to 115 when sitting.    ADL  Eating/Feeding: Performed;Minimal assistance Where Assessed - Eating/Feeding: Chair Grooming: Performed;Teeth care;Minimal assistance Where Assessed - Grooming: Supported standing Toilet Transfer: Performed;Minimal assistance;Other (comment) (+2 for lines only) Toilet Transfer Method: Stand pivot Toilet Transfer Equipment: Bedside commode Equipment Used: Gait belt Transfers/Ambulation Related to ADLs: Pt walked to sink.  Two people needed just for lines.  Pt required some cues for directionality with walker. ADL Comments: Pt more conversant today.  Pt was able to do one grooming task with only minimal cues for problem solving how to turn the water on.      OT Diagnosis:    OT Problem List:   OT Treatment Interventions:     OT Goals Acute Rehab OT Goals OT Goal Formulation: With patient Time For Goal Achievement: 08/16/12 Potential to Achieve Goals: Good ADL Goals Pt Will Perform Grooming: with set-up;Sitting, edge of bed;Other (comment);Unsupported ADL Goal: Grooming - Progress:  Progressing toward goals Pt Will Transfer to Toilet: with min assist;with DME;Stand pivot transfer;3-in-1 ADL Goal: Toilet Transfer - Progress: Progressing toward goals Miscellaneous OT Goals Miscellaneous OT Goal #1: Pt will maintain sustained attention for at least 10 mins with no more than min instructional cueing for redirection during grooming and selfcare tasks. OT Goal: Miscellaneous Goal #1 - Progress: Progressing toward goals Miscellaneous OT Goal #2: Pt will transition supine to sit in preparation for selfcare tasks with mod facilitation. OT Goal: Miscellaneous Goal #2 - Progress: Met  Visit Information  Last OT Received On: 08/04/12 Assistance Needed: +2 PT/OT Co-Evaluation/Treatment: Yes    Subjective Data      Prior Functioning       Cognition  Overall Cognitive Status: Impaired Area of Impairment: Attention;Memory;Following commands;Safety/judgement;Awareness of errors;Awareness of deficits;Problem solving;Executive functioning Arousal/Alertness: Awake/alert Orientation Level: Disoriented to;Time Behavior During Session: Flat affect Current Attention Level: Sustained Attention - Other Comments: Sustains ~30seconds.   Memory: Decreased recall of precautions Memory Deficits: Decreased STM and orientation.   Following Commands: Follows one step commands consistently Safety/Judgement: Decreased safety judgement for tasks assessed;Decreased awareness of need for assistance Awareness of Errors: Assistance required to identify errors made;Assistance required to correct errors made Awareness of Deficits: Pt with decreased awareness of all medical and functional deficits. Problem Solving: Limited by attention Executive Functioning: Pt performs adls/exec functioning in almost a "childlike" manor.  Pt unable to complete any executive functioning tasks at this time. Cognition - Other Comments: Pt with increased participation and communication.  Pt able to follow all simple one  step commands and many 2 step commands.    Mobility Bed Mobility Bed Mobility: Supine to Sit;Sitting - Scoot to Edge of Bed Rolling Left: 4:  Min assist Left Sidelying to Sit: 4: Min assist Supine to Sit: 3: Mod assist Sitting - Scoot to Edge of Bed: 4: Min assist Details for Bed Mobility Assistance: Pt w increased I initiating tasks on own and not grabbing for therapist.  Pt followed simple one step instructions well to come to side of bed. Transfers Transfers: Sit to Stand;Stand to Sit Sit to Stand: 4: Min assist;With upper extremity assist;With armrests;From bed Stand to Sit: 4: Min assist;With upper extremity assist;To chair/3-in-1;Other (comment) (cues to reach back.) Details for Transfer Assistance: Again needing slow step-by-step directions forsafe technique.  Occasional hand over hand cues.     Exercises    Balance Balance Balance Assessed: Yes Static Standing Balance Static Standing - Balance Support: Bilateral upper extremity supported Static Standing - Level of Assistance: 4: Min assist Static Standing - Comment/# of Minutes: 8  End of Session OT - End of Session Equipment Utilized During Treatment: Gait belt Activity Tolerance: Patient tolerated treatment well Patient left: in chair;with nursing in room;with call bell/phone within reach Nurse Communication: Mobility status  GO     Hope Budds 08/04/2012, 10:59 AM

## 2012-08-04 NOTE — Progress Notes (Signed)
Nutrition Follow-up  Intervention:   Resource Breeze supplement 3 times daily between meals (250 kcals, 9 gm protein per 8 fl oz carton)  Prostat liquid protein 30 ml 3 times daily with meals (100 kcals, 15 gm protein per dose) RD to follow for nutrition care plan  Assessment:   NGT discontinued. CWOCN note 8/8 reviewed. Advanced to Clear Liquids this AM. Patient moaning upon RD entry; stated she had "a little bit" of her lunch tray.  Diet Order:  Clear Liquids  Meds: Scheduled Meds:   . asenapine  5 mg Sublingual BID  . FLUoxetine  20 mg Oral Daily  . morphine   Intravenous Q4H  . pantoprazole  40 mg Oral Q1200   Or  . pantoprazole (PROTONIX) IV  40 mg Intravenous Q1200  . risperiDONE  3 mg Oral BID  . traZODone  300 mg Oral QHS  . DISCONTD: antiseptic oral rinse  15 mL Mouth Rinse QID  . DISCONTD: chlorhexidine  15 mL Mouth Rinse BID  . DISCONTD: traZODone  250-300 mg Oral QHS   Continuous Infusions:   . 0.45 % NaCl with KCl 20 mEq / L 50 mL (08/04/12 1406)   PRN Meds:.diphenhydrAMINE, diphenhydrAMINE, hydrOXYzine, LORazepam, naloxone, ondansetron (ZOFRAN) IV, sodium chloride  Labs:  CMP     Component Value Date/Time   NA 136 08/04/2012 0410   K 3.9 08/04/2012 0410   CL 102 08/04/2012 0410   CO2 25 08/04/2012 0410   GLUCOSE 101* 08/04/2012 0410   BUN 6 08/04/2012 0410   CREATININE 0.85 08/04/2012 0410   CALCIUM 7.8* 08/04/2012 0410   PROT 4.4* 07/31/2012 1531   ALBUMIN 2.7* 07/31/2012 1531   AST 35 07/31/2012 1531   ALT 14 07/31/2012 1531   ALKPHOS 57 07/31/2012 1531   BILITOT 0.6 07/31/2012 1531   GFRNONAA 84* 08/04/2012 0410   GFRAA >90 08/04/2012 0410     Intake/Output Summary (Last 24 hours) at 08/04/12 1446 Last data filed at 08/04/12 1300  Gross per 24 hour  Intake   1634 ml  Output   2985 ml  Net  -1351 ml    CBG (last 3)   Basename 08/03/12 0755  GLUCAP 90    Weight Status:  93.9 kg (8/8) -- fluctuating   Re-estimated needs:  1900-2100 kcals, 110-120 gm  protein  Nutrition Dx:  Inadequate Oral Intake r/t altered GI function now evidenced by PO intake < 25%, ongoing  Goal: Oral intake with meals & supplements to meet >/= 90% of estimated nutrition needs, currently unmet  Monitor:  PO & supplemental intake, weight, labs, I/O's  Kirkland Hun, RD, LDN Pager #: [phone] After-Hours Pager #: [phone]
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
## 2012-08-05 ENCOUNTER — Inpatient Hospital Stay (HOSPITAL_COMMUNITY): Payer: 59

## 2012-08-05 DIAGNOSIS — F329 Major depressive disorder, single episode, unspecified: Secondary | ICD-10-CM

## 2012-08-05 LAB — BASIC METABOLIC PANEL
Calcium: 7.8 mg/dL — ABNORMAL LOW (ref 8.4–10.5)
Creatinine, Ser: 0.85 mg/dL (ref 0.50–1.10)
GFR calc Af Amer: >90 mL/min (ref >90)
GFR calc non Af Amer: 84 mL/min — ABNORMAL LOW (ref >90)
Sodium: 135 mEq/L (ref 135–145)

## 2012-08-05 LAB — CBC WITH DIFFERENTIAL/PLATELET
Basophils Absolute: 0.1 10*3/uL (ref 0.0–0.1)
Basophils Relative: 0 % (ref 0–1)
Eosinophils Absolute: 0.1 10*3/uL (ref 0.0–0.7)
Eosinophils Relative: 1 % (ref 0–5)
HCT: 19.8 % — ABNORMAL LOW (ref 36.0–46.0)
MCHC: 33.3 g/dL (ref 30.0–36.0)
MCV: 83.2 fL (ref 78.0–100.0)
Monocytes Absolute: 1.4 10*3/uL — ABNORMAL HIGH (ref 0.1–1.0)
Platelets: 255 10*3/uL (ref 150–400)
RDW: 14.7 % (ref 11.5–15.5)

## 2012-08-05 LAB — CBC
MCH: 27.6 pg (ref 26.0–34.0)
MCHC: 34.5 g/dL (ref 30.0–36.0)
MCV: 80 fL (ref 78.0–100.0)
Platelets: 262 10*3/uL (ref 150–400)
RBC: 3.3 MIL/uL — ABNORMAL LOW (ref 3.87–5.11)
RDW: 15.2 % (ref 11.5–15.5)

## 2012-08-05 LAB — PREPARE RBC (CROSSMATCH)

## 2012-08-05 MED ORDER — IOHEXOL 350 MG/ML SOLN: 100.0000 mL | Freq: Once | INTRAVENOUS | Status: AC | PRN

## 2012-08-05 MED ORDER — POTASSIUM CHLORIDE 10 MEQ/100ML IV SOLN
10.0000 meq | INTRAVENOUS | Status: AC
  Administered 2012-08-05 (×3): 10 meq via INTRAVENOUS
  Filled 2012-08-05: qty 300
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
## 2012-08-05 NOTE — Progress Notes (Signed)
She is more tachycardic after blood.  Hct increased appropriately, cxr looks ok.  Not really in a lot of pain that I can tell.  Is sinus tach.  Will get chest cta to rule out pe

## 2012-08-05 NOTE — Progress Notes (Signed)
CRITICAL VALUE ALERT  Critical value received:  Hemoglobin 6.6  Date of notification:  10/05/2012  Time of notification:  05:10  Critical value read back:yes  Nurse who received alert:  Burna Cash, RN for Ulis Rias, RN  MD notified (1st page):  Dr. Corliss Skains  Time of first page:  05:30  MD notified (2nd page):  N/A  Time of second page:  N/A  Responding MD:  Dr. Corliss Skains  Time MD responded:  05:31

## 2012-08-05 NOTE — Progress Notes (Signed)
5 Days Post-Op  Subjective: Tolerating clears ok, more awake today  Objective: Vital signs in last 24 hours: Temp:  [99.1 F (37.3 C)-101.2 F (38.4 C)] 99.1 F (37.3 C) (08/10 0810) Pulse Rate:  [114-167] 136  (08/10 0700) Resp:  [16-30] 19  (08/10 0700) BP: (82-142)/(48-90) 111/68 mmHg (08/10 0700) SpO2:  [95 %-100 %] 100 % (08/10 0700) Weight:  [199 lb 1.2 oz (90.3 kg)] 199 lb 1.2 oz (90.3 kg) (08/10 0522)    Intake/Output from previous day: 08/09 0701 - 08/10 0700 In: 1144 [P.O.:480; I.V.:664] Out: 3375 [Urine:2925; Stool:450] Intake/Output this shift:    General appearance: no distress Resp: clear to auscultation bilaterally Cardio: tachycardic rr GI: wound clean, approp tender, bs present, stoma pink and functinoal  Lab Results:   Basename 08/05/12 0430 08/04/12 0410  WBC 13.0* 16.0*  HGB 6.6* 7.0*  HCT 19.8* 21.4*  PLT 255 259   BMET  Basename 08/05/12 0430 08/04/12 0410  NA 135 136  K 3.3* 3.9  CL 100 102  CO2 28 25  GLUCOSE 133* 101*  BUN 4* 6  CREATININE 0.85 0.85  CALCIUM 7.8* 7.8*    Assessment/Plan: POD 5 EXPLORATORY LAPAROTOMY, UPPER GI ENDOSCOPY,TRANSVERSE COLON RESECTION, COLOSTOMY 1. Continue pca, psych following 2. pulm toilet, oob today 3. Tachycardia likely related in large part to anemia, was a little hypotensive last night with decreased hct today, i don't think she is actively bleeding, will transfuse 2 units per dr. Fatima Sanger order and recheck in am 4. Advance to full liquids 5. scds 6. Can go to stepdown 7. Replete k    LOS: 5 days    [facility] 08/05/2012

## 2012-08-06 LAB — TYPE AND SCREEN

## 2012-08-06 LAB — CBC
HCT: 25.2 % — ABNORMAL LOW (ref 36.0–46.0)
MCHC: 34.5 g/dL (ref 30.0–36.0)
Platelets: 288 10*3/uL (ref 150–400)
RDW: 15.9 % — ABNORMAL HIGH (ref 11.5–15.5)

## 2012-08-06 LAB — BASIC METABOLIC PANEL
BUN: 3 mg/dL — ABNORMAL LOW (ref 6–23)
GFR calc Af Amer: >90 mL/min (ref >90)
GFR calc non Af Amer: >90 mL/min (ref >90)
Potassium: 3.6 mEq/L (ref 3.5–5.1)

## 2012-08-06 LAB — HEPARIN LEVEL (UNFRACTIONATED): Heparin Unfractionated: <0.1 [IU]/mL — ABNORMAL LOW (ref 0.30–0.70)

## 2012-08-06 MED ORDER — ACETAMINOPHEN 325 MG PO TABS
650.0000 mg | ORAL_TABLET | Freq: Four times a day (QID) | ORAL | Status: DC | PRN
  Administered 2012-08-06 – 2012-08-31 (×3): 650 mg via ORAL
  Filled 2012-08-06 (×3): qty 2

## 2012-08-06 MED ORDER — HEPARIN (PORCINE) IN NACL 100-0.45 UNIT/ML-% IJ SOLN
2800.0000 [IU]/h | INTRAMUSCULAR | Status: DC
  Administered 2012-08-07: 2100 [IU]/h via INTRAVENOUS
  Administered 2012-08-08: 2800 [IU]/h via INTRAVENOUS
  Administered 2012-08-08: 2600 [IU]/h via INTRAVENOUS
  Administered 2012-08-08: 2800 [IU]/h via INTRAVENOUS
  Filled 2012-08-06 (×10): qty 250

## 2012-08-06 MED ORDER — OXYCODONE-ACETAMINOPHEN 5-325 MG PO TABS
1.0000 | ORAL_TABLET | ORAL | Status: DC | PRN
  Administered 2012-08-06 – 2012-08-09 (×11): 1 via ORAL
  Filled 2012-08-06 (×11): qty 1

## 2012-08-06 MED ORDER — HEPARIN (PORCINE) IN NACL 100-0.45 UNIT/ML-% IJ SOLN
1600.0000 [IU]/h | INTRAMUSCULAR | Status: DC
  Administered 2012-08-06: 1200 [IU]/h via INTRAVENOUS
  Filled 2012-08-06 (×2): qty 250

## 2012-08-06 MED ORDER — POTASSIUM CHLORIDE 10 MEQ/50ML IV SOLN
10.0000 meq | INTRAVENOUS | Status: AC
  Administered 2012-08-06 (×3): 10 meq via INTRAVENOUS
  Filled 2012-08-06 (×3): qty 50

## 2012-08-06 MED ORDER — MORPHINE SULFATE 2 MG/ML IJ SOLN
2.0000 mg | INTRAMUSCULAR | Status: DC | PRN
  Administered 2012-08-06 – 2012-08-08 (×4): 2 mg via INTRAVENOUS
  Administered 2012-08-08: 4 mg via INTRAVENOUS
  Administered 2012-08-08: 2 mg via INTRAVENOUS
  Administered 2012-08-09 – 2012-08-10 (×6): 4 mg via INTRAVENOUS
  Filled 2012-08-06 (×2): qty 2
  Filled 2012-08-06 (×2): qty 1
  Filled 2012-08-06: qty 2
  Filled 2012-08-06: qty 1
  Filled 2012-08-06 (×2): qty 2
  Filled 2012-08-06: qty 1
  Filled 2012-08-06 (×3): qty 2
  Filled 2012-08-06 (×3): qty 1
  Filled 2012-08-06: qty 2

## 2012-08-06 NOTE — Progress Notes (Signed)
ANTICOAGULATION CONSULT NOTE - Initial Consult  Pharmacy Consult for heparin Indication: pulmonary embolus  Patient Measurements: Height: 5\' 4"  (162.6 cm) Weight: 199 lb 1.2 oz (90.3 kg) IBW/kg (Calculated) : 54.7  Heparin Dosing Weight: 75 mg  Vital Signs: Temp: 99.2 F (37.3 C) (08/11 1556) Temp src: Oral (08/11 1556) BP: 110/60 mmHg (08/11 1600) Pulse Rate: 98  (08/11 1600)  Labs:  Basename 08/06/12 1600 08/06/12 0830 08/06/12 0400 08/05/12 1630 08/05/12 0430 08/04/12 0410  HGB -- -- 8.7* 9.1* -- --  HCT -- -- 25.2* 26.4* 19.8* --  PLT -- -- 288 262 255 --  APTT -- -- -- -- -- --  LABPROT -- -- -- -- -- --  INR -- -- -- -- -- --  HEPARINUNFRC <0.10* <0.10* -- -- -- --  CREATININE -- -- 0.79 -- 0.85 0.85  CKTOTAL -- -- -- -- -- --  CKMB -- -- -- -- -- --  TROPONINI -- -- -- -- -- --    Estimated Creatinine Clearance: 100.7 ml/min (by C-G formula based on Cr of 0.79).  Assessment: Anticoagulation: small RLL PE: heparin at 1600 units/hr, no bleeding noted, per nursing there have been intermittent problems with line but very little pump alarms recently.  Will conservatively increase heparin gtt to 1900/hr and recheck HL tonight.  Goal of Therapy:  Heparin level 0.3-0.7 units/ml Monitor platelets by anticoagulation protocol: Yes   Plan:  1. Heparin IV infusion to 1900 units/hr.  2. Heparin level in 6 hours.  3. Daily CBC, heparin level.    Thank you for allowing pharmacy to be a part of this patients care team.  Sheppard Coil Pharm.D., BCPS Clinical Pharmacist 08/06/2012 5:00 PM Pager: [phone] Phone: [phone]

## 2012-08-06 NOTE — Progress Notes (Signed)
6 Days Post-Op  Subjective: Patient now heparinized for pulmonary embolus.  No shortness of breath Patient at increased risk of bleeding due to anticoagulation Remains mildly tachycardic Low-grade fever.   Objective: Vital signs in last 24 hours: Temp:  [98.3 F (36.8 C)-101.2 F (38.4 C)] 101.2 F (38.4 C) (08/11 0745) Pulse Rate:  [25-151] 114  (08/11 1000) Resp:  [14-28] 15  (08/11 1000) BP: (100-124)/(55-86) 100/63 mmHg (08/11 1000) SpO2:  [89 %-100 %] 99 % (08/11 1000)    Intake/Output from previous day: 08/10 0701 - 08/11 0700 In: 1487.8 [P.O.:280; I.V.:1207.8] Out: 1960 [Urine:1700; Drains:110; Stool:150] Intake/Output this shift: Total I/O In: 192 [I.V.:142; IV Piggyback:50] Out: 400 [Urine:400]  General appearance: no distress  Resp: clear to auscultation bilaterally  Cardio: tachycardic rr  GI: wound clean, approp tender, bs present, stoma pink and functional Drain #1 - brownish output - not foul-smelling; ?pancreatic leak Drain #2 - serosanguinous output    Lab Results:   Basename 08/06/12 0400 08/05/12 1630  WBC 13.6* 13.0*  HGB 8.7* 9.1*  HCT 25.2* 26.4*  PLT 288 262   BMET  Basename 08/06/12 0400 08/05/12 0430  NA 142 135  K 3.6 3.3*  CL 106 100  CO2 28 28  GLUCOSE 133* 133*  BUN 3* 4*  CREATININE 0.79 0.85  CALCIUM 7.8* 7.8*   PT/INR No results found for this basename: LABPROT:2,INR:2 in the last 72 hours ABG No results found for this basename: PHART:2,PCO2:2,PO2:2,HCO3:2 in the last 72 hours  Studies/Results: Ct Angio Chest Pe W/cm &/or Wo Cm  08/05/2012  *RADIOLOGY REPORT*  Clinical Data: Tachycardia.  Concern for pulmonary embolism. Recent abdominal surgery peri  CT ANGIOGRAPHY CHEST  Technique:  Multidetector CT imaging of the chest using the standard protocol during bolus administration of intravenous contrast. Multiplanar reconstructed images including MIPs were obtained and reviewed to evaluate the vascular anatomy.  Contrast:   100 ml Omnipaque  Comparison: None.  Findings: There is a filling defect within the medial segment of the right lower lobe pulmonary artery (image 146, series six) consistent with acute thromboembolism.  No clear evidence of additional emboli.  Exam is somewhat degraded by patient respiratory motion.  The left lower lobe is also poorly evaluated due atelectasis.  There is bilateral small effusions and dependent atelectasis at the lung bases.  The left effusions greater than right.  No evidence of pneumothorax or pulmonary edema.  Limited view of the upper abdomen is unremarkable.  Limited view of the skeleton unremarkable.  IMPRESSION:  1.  Small pulmonary embolism within the medial segment of the right lower lobe.  Overall clot burden is minimal. 2.  Bilateral pleural effusions and basilar atelectasis, left greater than right.  Findings conveyed to patient's floor nurse Robert on 08/05/2012 at 2300 hours  Original Report Authenticated By: Genevive Bi, M.D.   Dg Chest Port 1 View  08/05/2012  *RADIOLOGY REPORT*  Clinical Data: Shortness of breath.  Post-treatment.  Possible pulmonary edema appear  PORTABLE CHEST - 1 VIEW  Comparison: 08/02/2012.  Findings: Right central line tip proximal to mid superior vena cava level.  No gross pneumothorax.  Decrease in degree of pulmonary vascular congestion.  Left base opacification may represent shifting pleural fluid, atelectasis or infiltrate.  Follow-up and clearance recommended.  Nasogastric tube removed.  Mild cardiomegaly.  IMPRESSION: Decrease in degree of pulmonary vascular congestion.  Left base opacification may represent shifting pleural fluid, atelectasis or infiltrate.  Follow-up and clearance recommended.  Original Report Authenticated By: Viviann Spare  R. Constance Goltz, M.D.    Anti-infectives: Anti-infectives    None      Assessment/Plan: s/p Procedure(s) (LRB): EXPLORATORY LAPAROTOMY (N/A) UPPER GI ENDOSCOPY (N/A) TRANSVERSE COLON RESECTION  (N/A) COLOSTOMY (Right) Continue full liquids Drainage concerning for pancreatic leak Try PO pain meds to limit drowsiness secondary to pain/ psych meds   LOS: 6 days    [name] [name] K. 08/06/2012
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
## 2012-08-07 ENCOUNTER — Encounter (HOSPITAL_COMMUNITY): Payer: Self-pay | Admitting: *Deleted

## 2012-08-07 DIAGNOSIS — J810 Acute pulmonary edema: Secondary | ICD-10-CM

## 2012-08-07 LAB — CBC
MCH: 27.4 pg (ref 26.0–34.0)
MCV: 81.4 fL (ref 78.0–100.0)
Platelets: 374 10*3/uL (ref 150–400)
RDW: 15.7 % — ABNORMAL HIGH (ref 11.5–15.5)
WBC: 15.6 10*3/uL — ABNORMAL HIGH (ref 4.0–10.5)

## 2012-08-07 LAB — HEPARIN LEVEL (UNFRACTIONATED): Heparin Unfractionated: <0.1 [IU]/mL — ABNORMAL LOW (ref 0.30–0.70)

## 2012-08-07 MED ORDER — WARFARIN - PHARMACIST DOSING INPATIENT: Freq: Every day | Status: DC

## 2012-08-07 MED ORDER — HEPARIN BOLUS VIA INFUSION
2000.0000 [IU] | Freq: Once | INTRAVENOUS | Status: AC
  Administered 2012-08-07: 2000 [IU] via INTRAVENOUS
  Filled 2012-08-07: qty 2000

## 2012-08-07 MED ORDER — WARFARIN SODIUM 10 MG PO TABS
10.0000 mg | ORAL_TABLET | Freq: Once | ORAL | Status: AC
  Administered 2012-08-07: 10 mg via ORAL
  Filled 2012-08-07: qty 1

## 2012-08-07 NOTE — Progress Notes (Signed)
Trauma Service Note  Subjective: The patient is moaning in discomfort this morning.  Since my last visit she has been diagnosed with a small right segmental PE.  She is on heparin.  Objective: Vital signs in last 24 hours: Temp:  [98.6 F (37 C)-100.2 F (37.9 C)] 99 F (37.2 C) (08/12 0754) Pulse Rate:  [91-132] 91  (08/12 0800) Resp:  [11-25] 16  (08/12 0800) BP: (98-131)/(54-98) 129/76 mmHg (08/12 0800) SpO2:  [93 %-100 %] 95 % (08/12 0800) Weight:  [90.1 kg (198 lb 10.2 oz)] 90.1 kg (198 lb 10.2 oz) (08/12 0600) Last BM Date: 08/07/12  Intake/Output from previous day: 08/11 0701 - 08/12 0700 In: 3154.6 [P.O.:1380; I.V.:1554.6; IV Piggyback:100] Out: 1538 [Urine:1400; Drains:118; Stool:20] Intake/Output this shift: Total I/O In: 71 [I.V.:71] Out: 400 [Stool:400]  General: Moans constantly.  Says she has abdominal pain.  Eating a little bit  Lungs: Clear  Abd: Soft, wound still with wet-dry dressings.  Not sure if wound care ame by to consider negative pressure wound dressing.  Extremities: No clinical eveidence of DVT  Neuro: Intact  Lab Results: CBC   Basename 08/06/12 0400 08/05/12 1630  WBC 13.6* 13.0*  HGB 8.7* 9.1*  HCT 25.2* 26.4*  PLT 288 262   BMET  Basename 08/06/12 0400 08/05/12 0430  NA 142 135  K 3.6 3.3*  CL 106 100  CO2 28 28  GLUCOSE 133* 133*  BUN 3* 4*  CREATININE 0.79 0.85  CALCIUM 7.8* 7.8*   PT/INR No results found for this basename: LABPROT:2,INR:2 in the last 72 hours ABG No results found for this basename: PHART:2,PCO2:2,PO2:2,HCO3:2 in the last 72 hours  Studies/Results: Ct Angio Chest Pe W/cm &/or Wo Cm  08/05/2012  *RADIOLOGY REPORT*  Clinical Data: Tachycardia.  Concern for pulmonary embolism. Recent abdominal surgery peri  CT ANGIOGRAPHY CHEST  Technique:  Multidetector CT imaging of the chest using the standard protocol during bolus administration of intravenous contrast. Multiplanar reconstructed images including MIPs  were obtained and reviewed to evaluate the vascular anatomy.  Contrast:  100 ml Omnipaque  Comparison: None.  Findings: There is a filling defect within the medial segment of the right lower lobe pulmonary artery (image 146, series six) consistent with acute thromboembolism.  No clear evidence of additional emboli.  Exam is somewhat degraded by patient respiratory motion.  The left lower lobe is also poorly evaluated due atelectasis.  There is bilateral small effusions and dependent atelectasis at the lung bases.  The left effusions greater than right.  No evidence of pneumothorax or pulmonary edema.  Limited view of the upper abdomen is unremarkable.  Limited view of the skeleton unremarkable.  IMPRESSION:  1.  Small pulmonary embolism within the medial segment of the right lower lobe.  Overall clot burden is minimal. 2.  Bilateral pleural effusions and basilar atelectasis, left greater than right.  Findings conveyed to patient's floor nurse Robert on 08/05/2012 at 2300 hours  Original Report Authenticated By: Genevive Bi, M.D.   Dg Chest Port 1 View  08/05/2012  *RADIOLOGY REPORT*  Clinical Data: Shortness of breath.  Post-treatment.  Possible pulmonary edema appear  PORTABLE CHEST - 1 VIEW  Comparison: 08/02/2012.  Findings: Right central line tip proximal to mid superior vena cava level.  No gross pneumothorax.  Decrease in degree of pulmonary vascular congestion.  Left base opacification may represent shifting pleural fluid, atelectasis or infiltrate.  Follow-up and clearance recommended.  Nasogastric tube removed.  Mild cardiomegaly.  IMPRESSION: Decrease in degree  of pulmonary vascular congestion.  Left base opacification may represent shifting pleural fluid, atelectasis or infiltrate.  Follow-up and clearance recommended.  Original Report Authenticated By: Fuller Canada, M.D.    Anti-infectives: Anti-infectives    None      Assessment/Plan: s/p Procedure(s): EXPLORATORY LAPAROTOMY UPPER  GI ENDOSCOPY TRANSVERSE COLON RESECTION COLOSTOMY Advance diet Duplex venous study for possible DVT Possible negative pressure wound dressing.  LOS: 7 days   Marta Lamas. Gae Bon, MD, FACS [phone] Trauma Surgeon 08/07/2012

## 2012-08-07 NOTE — Progress Notes (Signed)
Pt admitted from 2300.  Sitter at bedside.  Pt AOx4, VSS, c/o 8/10 pain in abdomen.  Pain medication given.    Roselie Awkward, RN

## 2012-08-07 NOTE — Progress Notes (Signed)
VASCULAR LAB PRELIMINARY  PRELIMINARY  PRELIMINARY  PRELIMINARY  Bilateral lower extremity venous Dopplers completed.    Preliminary report:  There is no DVT or SVT noted in the bilateral lower extremities.  Bryona Foxworthy, 08/07/2012, 3:34 PM

## 2012-08-07 NOTE — Progress Notes (Signed)
Occupational Therapy Treatment and goal addition Patient Details Name: [name] [name] MRN: [id number] DOB: [birth year] Today's Date: 08/07/2012 Time: 4401-0272 OT Time Calculation (min): 25 min  OT Assessment / Plan / Recommendation Comments on Treatment Session This 41 yo making progress since eval and last treatment    Follow Up Recommendations  Inpatient Rehab    Barriers to Discharge       Equipment Recommendations  3 in 1 bedside comode    Recommendations for Other Services Rehab consult  Frequency Min 2X/week   Plan Discharge plan remains appropriate    Precautions / Restrictions Precautions Precautions: Fall Precaution Comments: colostomy, 2 drains Restrictions Weight Bearing Restrictions: No   Pertinent Vitals/Pain 8/10 abdomen and back    ADL  Eating/Feeding: Simulated;Independent Where Assessed - Eating/Feeding: Chair Grooming: Performed;Wash/dry hands;Wash/dry face;Teeth care;Brushing hair;Min guard (No cues) Where Assessed - Grooming: Unsupported standing Toilet Transfer: Simulated;Minimal assistance Toilet Transfer Method: Sit to stand Toilet Transfer Equipment:  (Bed to sink, out into hall and back to room to recliner) Transfers/Ambulation Related to ADLs: Min guard A sit to stand, MIn A (uni-lateral HHA) for ambulation ADL Comments: No problem solving issues with grooming tasks today    OT Diagnosis:    OT Problem List:   OT Treatment Interventions:     OT Goals ADL Goals ADL Goal: Grooming - Progress: Met ADL Goal: Toilet Transfer - Progress: Met Pt Will Perform Toileting - Clothing Manipulation: with supervision;Standing ADL Goal: Toileting - Clothing Manipulation - Progress: Goal set today Pt Will Perform Toileting - Hygiene: with min assist;Sit to stand from 3-in-1/toilet ADL Goal: Toileting - Hygiene - Progress: Goal set today Miscellaneous OT Goals OT Goal: Miscellaneous Goal #1 - Progress: Met OT Goal: Miscellaneous Goal #2 - Progress:  Met Miscellaneous OT Goal #3: Pt will be set-up/S for 3(+) grooming tasks standing at the sink letting the therapist know what she needs prior to pt getting to the sink OT Goal: Miscellaneous Goal #3 - Progress: Goal set today Miscellaneous OT Goal #4: Pt will be supervision to/from toliet in bathroom with use of grab bar for sit stand OT Goal: Miscellaneous Goal #4 - Progress: Goal set today  Visit Information  Last OT Received On: 08/07/12 Assistance Needed: +2 (for lines only) PT/OT Co-Evaluation/Treatment: Yes    Subjective Data      Prior Functioning       Cognition  Overall Cognitive Status: Impaired Area of Impairment: Attention Arousal/Alertness: Awake/alert Orientation Level:  (knew month and year, not day or date) Behavior During Session: Anxious Current Attention Level: Selective Following Commands: Follows one step commands consistently    Mobility Bed Mobility Bed Mobility: Rolling Right;Right Sidelying to Sit;Sitting - Scoot to Edge of Bed Rolling Right: 5: Supervision;With rail Right Sidelying to Sit: 5: Supervision;With rails;HOB elevated (20 degrees) Sitting - Scoot to Edge of Bed: 5: Supervision Transfers Transfers: Sit to Stand;Stand to Sit Sit to Stand: 4: Min guard;With upper extremity assist;From bed Stand to Sit: 4: Min guard;With upper extremity assist;With armrests;To chair/3-in-1   Exercises    Balance    End of Session OT - End of Session Activity Tolerance:  (limited due to increased HR and RR) Patient left: in chair (with sitter) Nurse Communication:  (that patient wants to call her husband)       Evette Georges [phone] 08/07/2012, 9:49 AM

## 2012-08-07 NOTE — Progress Notes (Signed)
ANTICOAGULATION CONSULT NOTE  Pharmacy Consult for heparin Indication: pulmonary embolus  Patient Measurements: Height: 5\' 4"  (162.6 cm) Weight: 199 lb 1.2 oz (90.3 kg) IBW/kg (Calculated) : 54.7  Heparin Dosing Weight: 75 mg  Vital Signs: Temp: 100.2 F (37.9 C) (08/11 2336) Temp src: Oral (08/11 2000) BP: 123/64 mmHg (08/12 0000) Pulse Rate: 98  (08/12 0000)  Labs:  Basename 08/06/12 2246 08/06/12 1600 08/06/12 0830 08/06/12 0400 08/05/12 1630 08/05/12 0430 08/04/12 0410  HGB -- -- -- 8.7* 9.1* -- --  HCT -- -- -- 25.2* 26.4* 19.8* --  PLT -- -- -- 288 262 255 --  APTT -- -- -- -- -- -- --  LABPROT -- -- -- -- -- -- --  INR -- -- -- -- -- -- --  HEPARINUNFRC <0.10* <0.10* <0.10* -- -- -- --  CREATININE -- -- -- 0.79 -- 0.85 0.85  CKTOTAL -- -- -- -- -- -- --  CKMB -- -- -- -- -- -- --  TROPONINI -- -- -- -- -- -- --    Estimated Creatinine Clearance: 100.7 ml/min (by C-G formula based on Cr of 0.79).  Assessment: 41 yo female with PE for Heparin  Goal of Therapy:  Heparin level 0.3-0.7 units/ml Monitor platelets by anticoagulation protocol: Yes   Plan:  Increase Heparin 2100 units/hr Follow-up am labs.  Geannie Risen, PharmD, BCPS

## 2012-08-07 NOTE — Progress Notes (Signed)
ANTICOAGULATION CONSULT NOTE  Pharmacy Consult for Heparin / Coumadin Indication: pulmonary embolus  Labs:  Basename 08/07/12 0900 08/06/12 2246 08/06/12 1600 08/06/12 0400 08/05/12 1630 08/05/12 0430  HGB 8.7* -- -- 8.7* -- --  HCT 25.9* -- -- 25.2* 26.4* --  PLT 374 -- -- 288 262 --  APTT -- -- -- -- -- --  LABPROT -- -- -- -- -- --  INR -- -- -- -- -- --  HEPARINUNFRC <0.10* <0.10* <0.10* -- -- --  CREATININE -- -- -- 0.79 -- 0.85  CKTOTAL -- -- -- -- -- --  CKMB -- -- -- -- -- --  TROPONINI -- -- -- -- -- --    Estimated Creatinine Clearance: 100.7 ml/min (by C-G formula based on Cr of 0.79).  Assessment: 41 yo female with PE  Starting Coumadin today  Heparin level still < 0.10  Goal of Therapy:  Heparin level 0.3-0.7 units/ml Monitor platelets by anticoagulation protocol: Yes   Plan:  Increase Heparin 2400 units/hr Coumadin 10 mg x 1 Follow-up 8 hour heparin level  Okey Regal, PharmD [phone]

## 2012-08-07 NOTE — Progress Notes (Signed)
ANTICOAGULATION CONSULT NOTE  Pharmacy Consult for Heparin Indication: pulmonary embolus  Labs:  Basename 08/07/12 1918 08/07/12 0900 08/06/12 2246 08/06/12 0400 08/05/12 1630 08/05/12 0430  HGB -- 8.7* -- 8.7* -- --  HCT -- 25.9* -- 25.2* 26.4* --  PLT -- 374 -- 288 262 --  APTT -- -- -- -- -- --  LABPROT -- -- -- -- -- --  INR -- -- -- -- -- --  HEPARINUNFRC <0.10* <0.10* <0.10* -- -- --  CREATININE -- -- -- 0.79 -- 0.85  CKTOTAL -- -- -- -- -- --  CKMB -- -- -- -- -- --  TROPONINI -- -- -- -- -- --    Estimated Creatinine Clearance: 100.7 ml/min (by C-G formula based on Cr of 0.79).  Assessment: 41 yo female with PE. Starting Coumadin today  Heparin level still < 0.10 - despite multiple increases. CBC is low but stable. No bleeding noted. Heparin level was drawn from same arm as infusion but below infusion site. Patient reports is having menses but this is now complete. No other active bleeding noted. No problems with infusion per RN. Unsure why we cannot get patient therapeutic.   Goal of Therapy:  Heparin level 0.3-0.7 units/ml Monitor platelets by anticoagulation protocol: Yes   Plan:  Bolus heparin 2000 units x1,  Increase Heparin 2600 units/hr. Heparin level and CBC in 6 hours.  Anti-thrombin III level to check for resistance to heparin.  Link Snuffer, PharmD, BCPS Clinical Pharmacist [phone] 08/07/2012, 8:10 PM

## 2012-08-07 NOTE — Consult Note (Addendum)
Wound care follow-up:  Requested to assess abd wound for possible vac dressing.  CCS following for assessment and plan of care to full thickness abd wound.  15X5X4cm, 90% red, 10% yellow, large amt greenish yellow drainage from wound.  Wound could benefit from vac dressing to control drainage and promote healing.  Bedside nurse states she will apply and they can change Q M/W/F.    Colostomy pouch leaking and drained 300cc liquid green stool.  New 2 piece pouch applied.  Demonstrated pouch application and emptying to patient, but she did not appear to understand or willing to participate.  Stoma red and viable, above skin level, 13/4 inches.  No family at bedside for teaching session.  Supplies ordered to bedside for staff use. Placed on [facility] discharge program.  Cammie Mcgee, RN, MSN, [employer]  [phone]
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
## 2012-08-08 DIAGNOSIS — R5381 Other malaise: Secondary | ICD-10-CM

## 2012-08-08 DIAGNOSIS — I2699 Other pulmonary embolism without acute cor pulmonale: Secondary | ICD-10-CM

## 2012-08-08 LAB — COMPREHENSIVE METABOLIC PANEL
ALT: 14 U/L (ref 0–35)
AST: 28 U/L (ref 0–37)
Albumin: 2.2 g/dL — ABNORMAL LOW (ref 3.5–5.2)
Calcium: 8.2 mg/dL — ABNORMAL LOW (ref 8.4–10.5)
Potassium: 3.5 meq/L (ref 3.5–5.1)
Sodium: 138 meq/L (ref 135–145)
Total Protein: 5.6 g/dL — ABNORMAL LOW (ref 6.0–8.3)

## 2012-08-08 LAB — HEPARIN LEVEL (UNFRACTIONATED)
Heparin Unfractionated: 0.22 [IU]/mL — ABNORMAL LOW (ref 0.30–0.70)
Heparin Unfractionated: 0.35 [IU]/mL (ref 0.30–0.70)

## 2012-08-08 LAB — CBC
HCT: 25 % — ABNORMAL LOW (ref 36.0–46.0)
Hemoglobin: 8.2 g/dL — ABNORMAL LOW (ref 12.0–15.0)
RBC: 3.04 MIL/uL — ABNORMAL LOW (ref 3.87–5.11)

## 2012-08-08 LAB — PROTIME-INR
INR: 2.3 — ABNORMAL HIGH (ref 0.00–1.49)
Prothrombin Time: 25.7 s — ABNORMAL HIGH (ref 11.6–15.2)

## 2012-08-08 NOTE — Progress Notes (Signed)
ANTICOAGULATION CONSULT NOTE   Pharmacy Consult for Heparin/Coumadin Indication: pulmonary embolus  Patient Measurements: Height: 5\' 4"  (162.6 cm) Weight: 198 lb 10.2 oz (90.1 kg) (bed weight scale) IBW/kg (Calculated) : 54.7  Heparin Dosing Weight: 75 mg  Vital Signs: Temp: 98.6 F (37 C) (08/12 2345) Temp src: Oral (08/12 2345) BP: 133/70 mmHg (08/12 2345) Pulse Rate: 116  (08/12 2345)  Labs:  Basename 08/08/12 0209 08/07/12 1918 08/07/12 0900 08/06/12 0400 08/05/12 0430  HGB 8.2* -- 8.7* -- --  HCT 25.0* -- 25.9* 25.2* --  PLT 382 -- 374 288 --  APTT -- -- -- -- --  LABPROT 25.7* -- -- -- --  INR 2.30* -- -- -- --  HEPARINUNFRC 0.22* <0.10* <0.10* -- --  CREATININE -- -- -- 0.79 0.85  CKTOTAL -- -- -- -- --  CKMB -- -- -- -- --  TROPONINI -- -- -- -- --    Estimated Creatinine Clearance: 100.7 ml/min (by C-G formula based on Cr of 0.79).  Assessment: 41 yo female with new PE, Day # 3/5 anticoagulation overlap.  Finally obtained a measureable heparin level,  though still subtherapeutic.  ATIII level reports as below normal, as expected in patient currently receiving heparin.  INR now 2.3 (baseline 1.27 8/6) only 8 hours after dose.        Goal of Therapy:  Heparin level 0.3-0.7 units/ml Monitor platelets by anticoagulation protocol: Yes   Plan:  Increase Heparin 2800 units/hr Check heparin level in 6 hours. Will obtain CMet with next heparin level to assess whether INR may be increased by liver dysfunction given large increase from baseline.  Geannie Risen, PharmD, BCPS 08/08/2012 3:32 AM

## 2012-08-08 NOTE — Progress Notes (Signed)
Physical Therapy Treatment Patient Details Name: [name] [name] MRN: [id number] DOB: Dec 20, 1971 Today's Date: 08/08/2012 Time: 4098-1191 PT Time Calculation (min): 23 min  PT Assessment / Plan / Recommendation Comments on Treatment Session  pt appears with good mentation, appropriately conversive.  Can be easily helped at this point by the staff.    Follow Up Recommendations       Barriers to Discharge        Equipment Recommendations       Recommendations for Other Services    Frequency Min 5X/week   Plan      Precautions / Restrictions Precautions Precaution Comments: colostomy, 2 JP drains, wound vac Restrictions Weight Bearing Restrictions: No   Pertinent Vitals/Pain     Mobility  Transfers Transfers: Sit to Stand;Stand to Sit Sit to Stand: 5: Supervision;From chair/3-in-1 Stand to Sit: 5: Supervision;To chair/3-in-1 Details for Transfer Assistance: used safe technique Ambulation/Gait Ambulation/Gait Assistance: 5: Supervision Ambulation Distance (Feet): 1300 Feet Assistive device: None Ambulation/Gait Assistance Details: generally steady gait; able to scan her environment without incident and vary speed, thought more difficult with higher speeds Gait Pattern: Within Functional Limits;Decreased stride length Stairs: Yes Stairs Assistance: 5: Supervision Stairs Assistance Details (indicate cue type and reason): no assist needed; pt chose to use step to method with rail Stair Management Technique: One rail Right;Step to pattern;Backwards;Forwards;Other (comment) (limited by lines only) Number of Stairs: 2  Wheelchair Mobility Wheelchair Mobility: No    Exercises     PT Diagnosis:    PT Problem List:   PT Treatment Interventions:     PT Goals Acute Rehab PT Goals Time For Goal Achievement: 08/16/12 Potential to Achieve Goals: Good PT Goal: Sit to Stand - Progress: Progressing toward goal PT Goal: Stand to Sit - Progress: Progressing toward goal PT  Goal: Ambulate - Progress: Progressing toward goal PT Goal: Up/Down Stairs - Progress: Met  Visit Information  Last PT Received On: 08/08/12 Assistance Needed: +1    Subjective Data  Subjective: You know, the walking made my pain betterr   Cognition  Overall Cognitive Status: Appears within functional limits for tasks assessed/performed    Balance     End of Session PT - End of Session Activity Tolerance: Patient tolerated treatment well Patient left: in chair;with call bell/phone within reach;Other (comment) Psychiatrist) Nurse Communication: Mobility status   GP     [name] [name], [name] [name] 08/08/2012, 4:44 PM  08/08/2012  [name] [name] Bing, PT [phone] (pager)

## 2012-08-08 NOTE — Progress Notes (Signed)
Report called to [facility], receiving RN on 6N.  Attempted to call pt's husband (at pt request) with no answer.  Pt transferred to 6N05 via wheelchair with sitter.    Roselie Awkward, RN
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
## 2012-08-08 NOTE — Progress Notes (Signed)
UR completed 

## 2012-08-08 NOTE — Progress Notes (Addendum)
ANTICOAGULATION CONSULT NOTE   Pharmacy Consult for Heparin/Coumadin Indication: pulmonary embolus  Labs:  Basename 08/08/12 0958 08/08/12 0209 08/07/12 1918 08/07/12 0900 08/06/12 0400  HGB -- 8.2* -- 8.7* --  HCT -- 25.0* -- 25.9* 25.2*  PLT -- 382 -- 374 288  APTT -- -- -- -- --  LABPROT -- 25.7* -- -- --  INR -- 2.30* -- -- --  HEPARINUNFRC 0.35 0.22* <0.10* -- --  CREATININE -- -- -- -- 0.79  CKTOTAL -- -- -- -- --  CKMB -- -- -- -- --  TROPONINI -- -- -- -- --    Estimated Creatinine Clearance: 100.7 ml/min (by C-G formula based on Cr of 0.79).  Assessment: 41 yo female with new PE, Day # 3/5 anticoagulation overlap.    ATIII level reports as below normal, as expected in patient currently receiving heparin.    INR now 2.3 (baseline 1.27 8/6) only 8 hours after dose  Day 2/5 of overlap     Heparin level now therapeutic  Goal of Therapy:  Heparin level 0.3-0.7 units/ml Monitor platelets by anticoagulation protocol: Yes INR = 2 to 3   Plan:  Continue Heparin at  2800 units/hr No Coumadin today LFTs slightly elevated Follow up AM heparin level, INR  Okey Regal, PharmD [phone] 08/08/2012 10:46 AM

## 2012-08-08 NOTE — Progress Notes (Signed)
Discussed plan of care with husband and patient this afternoon.  1. Awaiting review of CIR and determination of needs for physical therapy. 2. Inpatient psych admission: Spoke with [facility] intake, directed to speak with patient attending Psych MD at [facility] for ECT.  : Dr. Karie Schwalbe and staffed case. Dr. Karie Schwalbe willing to review patient's clinicals for possible admission for inpatient. (No guarantee of a bed, just review). 3. Patient agreeable, and husband agreeable.  Plan:  Fax over clinicals once physical needs have been identified and if needed admission into inpatient psych hospital.  Physical needs cannot limit patient from participating in psych related activities, thus needs to be pretty independent.  Will follow up.  Ashley Jacobs, MSW LCSW [phone]
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
## 2012-08-09 LAB — CBC
HCT: 26.9 % — ABNORMAL LOW (ref 36.0–46.0)
Hemoglobin: 8.8 g/dL — ABNORMAL LOW (ref 12.0–15.0)
RBC: 3.22 MIL/uL — ABNORMAL LOW (ref 3.87–5.11)
RDW: 16.2 % — ABNORMAL HIGH (ref 11.5–15.5)
WBC: 14.7 10*3/uL — ABNORMAL HIGH (ref 4.0–10.5)

## 2012-08-09 LAB — AMYLASE, BODY FLUID: Amylase, Fluid: >10000 U/L

## 2012-08-09 LAB — HEPARIN LEVEL (UNFRACTIONATED): Heparin Unfractionated: <0.1 [IU]/mL — ABNORMAL LOW (ref 0.30–0.70)

## 2012-08-09 LAB — PROTIME-INR: INR: 5.77 (ref 0.00–1.49)

## 2012-08-09 MED ORDER — OCTREOTIDE ACETATE 100 MCG/ML IJ SOLN
100.0000 ug | Freq: Two times a day (BID) | INTRAMUSCULAR | Status: DC
  Administered 2012-08-09 – 2012-08-15 (×14): 100 ug via SUBCUTANEOUS
  Filled 2012-08-09 (×19): qty 1

## 2012-08-09 NOTE — Progress Notes (Signed)
Physical Therapy Treatment Patient Details Name: [name] [name] MRN: [id number] DOB: Aug 20, 1971 Today's Date: 08/09/2012 Time: 1324-4010 PT Time Calculation (min): 10 min  PT Assessment / Plan / Recommendation Comments on Treatment Session  Patient meeting all goals appropriate for DC from acute PT. Encouraged to walk with staff. Patient has been walking multiple times in hallway    Follow Up Recommendations  No PT follow up    Barriers to Discharge        Equipment Recommendations  None recommended by PT    Recommendations for Other Services    Frequency     Plan All goals met and education completed, patient dischaged from PT services    Precautions / Restrictions Precautions Precautions: None Precaution Comments: multiple lines and tubes   Pertinent Vitals/Pain     Mobility  Bed Mobility Supine to Sit: 7: Independent Sit to Supine: 7: Independent Transfers Sit to Stand: 7: Independent Stand to Sit: 7: Independent Ambulation/Gait Ambulation/Gait Assistance: 6: Modified independent (Device/Increase time) Ambulation Distance (Feet): 1300 Feet Assistive device: None Stairs: No    Exercises     PT Diagnosis:    PT Problem List:   PT Treatment Interventions:     PT Goals Acute Rehab PT Goals PT Goal: Rolling Supine to Right Side - Progress: Met PT Goal: Rolling Supine to Left Side - Progress: Met PT Goal: Supine/Side to Sit - Progress: Met PT Goal: Sit to Supine/Side - Progress: Met PT Goal: Sit to Stand - Progress: Met PT Goal: Stand to Sit - Progress: Met PT Goal: Ambulate - Progress: Met PT Goal: Up/Down Stairs - Progress: Met  Visit Information  Last PT Received On: 08/09/12 Assistance Needed: +1    Subjective Data      Cognition  Overall Cognitive Status: Appears within functional limits for tasks assessed/performed Arousal/Alertness: Awake/alert Orientation Level: Appears intact for tasks assessed Behavior During Session: [facility] for tasks  performed    Balance     End of Session PT - End of Session Activity Tolerance: Patient tolerated treatment well Patient left: in chair Nurse Communication: Mobility status   GP     Fredrich Birks 08/09/2012, 11:40 AM 08/09/2012 Fredrich Birks PTA [phone] pager [phone] office
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
## 2012-08-09 NOTE — Progress Notes (Addendum)
Super therapeutic INR at 6.  Will recheck stat.  No Coumadin today.  Discontinue heparin.  Also has milky, pancreatic type drainage coming from her VAC.  Looks like the same stuff that is coming out of [place] drain.  Will start the patient on Octreotide.  Marta Lamas. Gae Bon, MD, FACS [phone] Trauma Surgeon

## 2012-08-09 NOTE — Progress Notes (Signed)
CIR Admissions: Dr Wynn Banker evaluated pt. Noted plan now is for pt to transfer to inpatient psych. Call for questions: [phone].

## 2012-08-09 NOTE — Progress Notes (Signed)
Clinical Social Work  CSW spoke with Christiane Ha from [facility] again who reported since patient was being seen by Dr. Karie Schwalbe that they could review information. CSW faxed intake referral and intake confirmed they received information. Intake reports no available beds today but information will be saved. CSW will continue to follow.  [place], [place] [phone] (Coverage for [facility] Client Nail)

## 2012-08-09 NOTE — Progress Notes (Signed)
ANTICOAGULATION CONSULT NOTE   Pharmacy Consult for Coumadin Indication: pulmonary embolus  Labs:  Basename 08/09/12 1448 08/09/12 0535 08/08/12 0958 08/08/12 0209 08/07/12 0900  HGB -- 8.8* -- 8.2* --  HCT -- 26.9* -- 25.0* 25.9*  PLT -- 478* -- 382 374  APTT -- -- -- -- --  LABPROT 52.7* 58.7* -- 25.7* --  INR 5.77* 6.62* -- 2.30* --  HEPARINUNFRC -- <0.10* 0.35 0.22* --  CREATININE -- -- 0.79 -- --  CKTOTAL -- -- -- -- --  CKMB -- -- -- -- --  TROPONINI -- -- -- -- --    Estimated Creatinine Clearance: 100.7 ml/min (by C-G formula based on Cr of 0.79).  Assessment: 41 yo female with new PE  INR up to 5.77 today on repeat (6.62) after only one dose of Coumadin 10 mg  Heparin discontinued with only 2 days of overlap of heparin / Coumadin due to increase in INR and recent sugeries    Goal of Therapy:  Heparin level 0.3-0.7 units/ml Monitor platelets by anticoagulation protocol: Yes INR = 2 to 3   Plan:  No Coumadin today Follow up AM INR  Okey Regal, PharmD [phone] 08/09/2012 3:56 PM

## 2012-08-09 NOTE — Progress Notes (Signed)
Clinical Social Work  CSW contacted [facility] and spoke with Christiane Ha who reported no available beds. Intake stated 14 patients were in ED and requested that CSW not fax information and stated that they would not review fax. CSW will continue to research other placement options.  [place], [place] [phone] (Coverage for [facility] Client Nail)

## 2012-08-09 NOTE — Progress Notes (Signed)
[facility] : INR 6.62, PT 58.7, NOTIFIED BY KIMBERLY BARR NOTIFIED PHARMACY, VERBAL D/C HEPARIN DRIP

## 2012-08-09 NOTE — Progress Notes (Signed)
Ostomy changed. Wd vac changed Abd open beefy red new vac applied. L chest area opening applied W-D dressing/ back dressing applied W-D also.
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
## 2012-08-09 NOTE — Progress Notes (Signed)
Occupational Therapy Treatment Patient Details Name: [name] [name] MRN: [id number] DOB: 1971/10/05 Today's Date: 08/09/2012 Time: 4098-1191 OT Time Calculation (min): 12 min  OT Assessment / Plan / Recommendation Comments on Treatment Session Pt has made excellent progress with OT.  Currently is independent with all mobility and selfcare tasks.  No further acute or post acute OT needs.    Follow Up Recommendations  No OT follow up       Equipment Recommendations  None recommended by OT    Recommendations for Other Services Other (comment) (No follow-up needs.)     Plan Discharge plan needs to be updated    Precautions / Restrictions Precautions Precautions: None Precaution Comments: wound vac  Restrictions Weight Bearing Restrictions: No   Pertinent Vitals/Pain O2 sats 98%, HR 131    ADL  Grooming: Simulated;Independent Where Assessed - Grooming: Unsupported standing Toilet Transfer: Performed;Independent Toilet Transfer Method: Other (comment) (ambulating to the bathroom) Toilet Transfer Equipment: Comfort height toilet Toileting - Clothing Manipulation and Hygiene: Simulated;Independent Where Assessed - Toileting Clothing Manipulation and Hygiene: Sit to stand from 3-in-1 or toilet Transfers/Ambulation Related to ADLs: Pt is currently independent for all mobility and transfers. ADL Comments: Pt is independent with all simulated selfcare and toileting.  No further OT needs at this time.  Discharging from services.      OT Goals ADL Goals ADL Goal: Upper Body Bathing - Progress: Met ADL Goal: Toileting - Clothing Manipulation - Progress: Met ADL Goal: Toileting - Hygiene - Progress: Met Miscellaneous OT Goals OT Goal: Miscellaneous Goal #1 - Progress: Met OT Goal: Miscellaneous Goal #2 - Progress: Met OT Goal: Miscellaneous Goal #3 - Progress: Met OT Goal: Miscellaneous Goal #4 - Progress: Met  Visit Information  Last OT Received On: 08/09/12 Assistance  Needed: +1    Subjective Data  Subjective: " I took my on bath this morning without any help." Patient Stated Goal: Did not state but agreeable to participate in OT.      Cognition  Overall Cognitive Status: Appears within functional limits for tasks assessed/performed Arousal/Alertness: Awake/alert Orientation Level: Appears intact for tasks assessed Behavior During Session: [facility] for tasks performed Current Attention Level: Selective    Mobility Bed Mobility Bed Mobility: Supine to Sit Supine to Sit: 7: Independent;HOB flat Sit to Supine: 7: Independent Transfers Sit to Stand: 7: Independent Stand to Sit: 7: Independent      Balance Balance Balance Assessed: Yes Dynamic Standing Balance Dynamic Standing - Balance Support: No upper extremity supported Dynamic Standing - Level of Assistance: 7: Independent  End of Session OT - End of Session Activity Tolerance: Patient tolerated treatment well Patient left: in chair;with family/visitor present     Jalena Vanderlinden OTR/L Pager number [phone] 08/09/2012, 3:28 PM
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
## 2012-08-09 NOTE — Progress Notes (Signed)
CRITICAL VALUE ALERT  Critical value received:INR 5.77 PT 52.7  Date of notification:08/09/2012  Time of notification:1335  Critical value read back:yes Nurse who received alert:Sora Vrooman Lorin Picket  MD notified (1st page):Dr. Lindie Spruce  Time of first page:1340  MD notified (2nd page):  Time of second page:  Responding MD:Dr.Wyatt  Time MD responded:1342

## 2012-08-10 ENCOUNTER — Encounter (HOSPITAL_COMMUNITY): Payer: Self-pay | Admitting: *Deleted

## 2012-08-10 DIAGNOSIS — S2249XA Multiple fractures of ribs, unspecified side, initial encounter for closed fracture: Secondary | ICD-10-CM

## 2012-08-10 LAB — BASIC METABOLIC PANEL
BUN: 8 mg/dL (ref 6–23)
Creatinine, Ser: 0.9 mg/dL (ref 0.50–1.10)
GFR calc Af Amer: >90 mL/min (ref >90)
GFR calc non Af Amer: 78 mL/min — ABNORMAL LOW (ref >90)
Potassium: 4.2 mEq/L (ref 3.5–5.1)

## 2012-08-10 LAB — CBC
HCT: 28.7 % — ABNORMAL LOW (ref 36.0–46.0)
MCHC: 32.1 g/dL (ref 30.0–36.0)
RDW: 16 % — ABNORMAL HIGH (ref 11.5–15.5)

## 2012-08-10 LAB — PROTIME-INR: INR: 5.45 (ref 0.00–1.49)

## 2012-08-10 MED ORDER — OXYCODONE HCL 5 MG PO TABS
5.0000 mg | ORAL_TABLET | ORAL | Status: DC | PRN
  Administered 2012-08-10 – 2012-08-11 (×5): 15 mg via ORAL
  Administered 2012-08-11: 10 mg via ORAL
  Administered 2012-08-12 – 2012-08-15 (×6): 15 mg via ORAL
  Administered 2012-08-15 (×2): 10 mg via ORAL
  Filled 2012-08-10 (×3): qty 3
  Filled 2012-08-10: qty 2
  Filled 2012-08-10 (×6): qty 3
  Filled 2012-08-10: qty 2
  Filled 2012-08-10 (×3): qty 3
  Filled 2012-08-10 (×2): qty 2

## 2012-08-10 MED ORDER — BUPROPION HCL ER (XL) 150 MG PO TB24
150.0000 mg | ORAL_TABLET | Freq: Every day | ORAL | Status: DC
  Administered 2012-08-11 – 2012-09-01 (×22): 150 mg via ORAL
  Filled 2012-08-10 (×25): qty 1

## 2012-08-10 MED ORDER — MORPHINE SULFATE 2 MG/ML IJ SOLN
2.0000 mg | INTRAMUSCULAR | Status: DC | PRN
  Administered 2012-08-11 – 2012-08-12 (×2): 2 mg via INTRAVENOUS
  Filled 2012-08-10 (×2): qty 1

## 2012-08-10 MED ORDER — RISPERIDONE 3 MG PO TABS
3.0000 mg | ORAL_TABLET | Freq: Every day | ORAL | Status: DC
  Administered 2012-08-10 – 2012-08-28 (×19): 3 mg via ORAL
  Filled 2012-08-10 (×23): qty 1

## 2012-08-10 MED ORDER — TRAZODONE HCL 50 MG PO TABS
50.0000 mg | ORAL_TABLET | Freq: Every evening | ORAL | Status: DC | PRN
  Administered 2012-08-10 – 2012-08-31 (×8): 50 mg via ORAL
  Filled 2012-08-10 (×10): qty 1

## 2012-08-10 NOTE — Progress Notes (Signed)
Pancreatic drainage about 200cc daily now.  Octreotide started.  I changed her to low fat diet.  If output does not improve may need NPO/TNA/ERCP and worse case possible distal pancreatectomy.  I spoke to patient about all of this. Patient examined and I agree with the assessment and plan  [name], MD, MPH, FACS Pager: [phone]  08/10/2012 12:53 PM

## 2012-08-10 NOTE — Progress Notes (Signed)
Regarding: Disposition:  Discussed patient in LOS meeting this Am and discussed barriers to long length of stay and DC. Regarding [name] [name], patient is not a primary patient of Dr. Karie Schwalbe (ECT provider), thus she does not have to be direct admit. Dr. Karie Schwalbe is aware of the patient and has been reviewing medical clinicals, however she is not able to be accepted due to her medical issues at this time. [name] [name] called back around 10:30 am regarding final decision regarding admission per Dr. Earley Abide that he feels he cannot accept her even when medically stable and she should be referred to [name] for treatment. Thus declined from [facility]. Patient was also faxed to [facility] for review and awaiting decision.  Thus will begin paperwork for [facility] once patient more medically stable (referrals are not accepted until medical clearance and they will not review).  Currently per [name] [name] with intake at [facility]: patient will be reviewed only when medically cleared and wait list is around 2-3 weeks, but could be sooner depending on DC and other patients.  Updated medical team regarding disposition and plan. Will see patient and update her as well and complete counseling session.  [name] [name], MSW LCSW [phone]

## 2012-08-10 NOTE — Consult Note (Signed)
Ostomy follow-up:  Vac dressing changed yesterday by bedside nurse.  CCS following for assessment and plan of care.  Sponge intact with good seal at cont suction.  Large amt thick tan drainage. Bedside nurse to change Q M/W/F.  Demonstrated ostomy pouch change.  Pt able to assist with pouch application and emptying.  Able to close with velcro.  Stoma red and viable, 2 inches, above skin level.  Mod tan semi-formed stool in pouch.  Discussed pouching routines and obtaining supplies.  Pt appeared to understand discussion and asked appropriate questions.  No family present during any teaching sessions.  Supplies at bedside for staff use.  Will need nursing assistance with vac and ostomy application after discharge.    Cammie Mcgee, RN, MSN, [employer]  [phone]

## 2012-08-10 NOTE — Progress Notes (Signed)
Pt discussed at hospital LOS meeting this am.  

## 2012-08-10 NOTE — Progress Notes (Signed)
ANTICOAGULATION CONSULT NOTE   Pharmacy Consult for Coumadin Indication: pulmonary embolus  Labs:  Basename 08/10/12 0725 08/09/12 1448 08/09/12 0535 08/08/12 0958 08/08/12 0209  HGB 9.2* -- 8.8* -- --  HCT 28.7* -- 26.9* -- 25.0*  PLT 523* -- 478* -- 382  APTT -- -- -- -- --  LABPROT 50.4* 52.7* 58.7* -- --  INR 5.45* 5.77* 6.62* -- --  HEPARINUNFRC -- -- <0.10* 0.35 0.22*  CREATININE 0.90 -- -- 0.79 --  CKTOTAL -- -- -- -- --  CKMB -- -- -- -- --  TROPONINI -- -- -- -- --    Estimated Creatinine Clearance: 89.5 ml/min (by C-G formula based on Cr of 0.9).  Assessment: 41 y.o. F started on heparin + warfarin for new pulmonary embolus this admission. Heparin was d/ced on 8/13 due to large jump of INR into therapeutic range after one dose of warfarin 10 mg on 8/12. The INR remains SUPRAtherapeutic today (5.45 << 6.62, goal of 2-3). The patient only completed 1 day of overlap with Warfarin + Heparin prior to its discontinuation.  It is unknown what caused the large jump in INR. LFTs were WNL on 8/13. It could be related to recent surgeries, diet, and low albumin. Will wait for INR to trend back down prior to resuming warfarin at a much lower dose.   Goal of Therapy:  INR 2-3   Plan:  1. Hold warfarin dose today 2. Will continue to monitor for any signs/symptoms of bleeding and will follow up with PT/INR in the a.m.   Georgina Pillion, PharmD, BCPS Clinical Pharmacist Pager: [phone] 08/10/2012 1:13 PM
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
## 2012-08-10 NOTE — Progress Notes (Signed)
Nutrition Follow-up  Intervention:   1.  General healthful diet; encouraged intake of meals and supplements 2.  Brief education; provided on fat modified diet.  Reviewed goals of nutrition therapy and best management of pain with meals.  Pt verbalizes understanding and appreciates handout "Fat Modified Nutrition Therapy."  RD contact info provided for additional questions.  Assessment:   Pt reports pain after meals. Diet downgraded today to Fat Modified.  Pt states she does not understand the goals of the this diet and how to modify intake- ordered a hot dog for dinner. Pt states that she has a very poor appetite and is having trouble ordering meals.  RD to assist in ordering meals in advance. Pt with pancreatic drain and wound vac.  Pt desires to improve intake.  Diet Order:  Fat Modified  Meds: Scheduled Meds:   . buPROPion  150 mg Oral Q breakfast  . feeding supplement  30 mL Oral TID WC  . feeding supplement  1 Container Oral TID BM  . octreotide  100 mcg Subcutaneous Q12H  . risperiDONE  3 mg Oral QHS  . Warfarin - Pharmacist Dosing Inpatient   Does not apply q1800  . DISCONTD: asenapine  5 mg Sublingual BID  . DISCONTD: FLUoxetine  20 mg Oral Daily  . DISCONTD: pantoprazole  40 mg Oral Q1200  . DISCONTD: pantoprazole (PROTONIX) IV  40 mg Intravenous Q1200  . DISCONTD: risperiDONE  3 mg Oral BID  . DISCONTD: traZODone  300 mg Oral QHS   Continuous Infusions:   . DISCONTD: 0.45 % NaCl with KCl 20 mEq / L 50 mL/hr at 08/10/12 0652   PRN Meds:.acetaminophen, diphenhydrAMINE, diphenhydrAMINE, hydrOXYzine, LORazepam, morphine injection, ondansetron (ZOFRAN) IV, oxyCODONE, traZODone, DISCONTD:  morphine injection, DISCONTD: naloxone, DISCONTD: oxyCODONE-acetaminophen, DISCONTD: sodium chloride  Labs:  CMP     Component Value Date/Time   NA 140 08/10/2012 0725   K 4.2 08/10/2012 0725   CL 104 08/10/2012 0725   CO2 23 08/10/2012 0725   GLUCOSE 134* 08/10/2012 0725   BUN 8  08/10/2012 0725   CREATININE 0.90 08/10/2012 0725   CALCIUM 8.4 08/10/2012 0725   PROT 5.6* 08/08/2012 0958   ALBUMIN 2.2* 08/08/2012 0958   AST 28 08/08/2012 0958   ALT 14 08/08/2012 0958   ALKPHOS 132* 08/08/2012 0958   BILITOT 0.7 08/08/2012 0958   GFRNONAA 78* 08/10/2012 0725   GFRAA >90 08/10/2012 0725     Intake/Output Summary (Last 24 hours) at 08/10/12 1543 Last data filed at 08/10/12 0900  Gross per 24 hour  Intake   1525 ml  Output     90 ml  Net   1435 ml    Weight Status:  198 lbs, stable  Re-estimated needs:  1900-2100 kcal, 90-100g  Nutrition Dx:  Inadequate oral intake, ongoing New dx:  Nutrition-related knowledge deficit r/t fat modified diet AEB pt request for education  Monitor:   1.  Food/Beverage; pt to consume >50% of meals and at least 2 supplements daily 2.  Wt/wt change; monitor trends.   Loyce Dys, MS RD LDN Clinical Inpatient Dietitian Pager: [phone] Weekend/After hours pager: [phone]

## 2012-08-11 LAB — PROTIME-INR
INR: 4.51 — ABNORMAL HIGH (ref 0.00–1.49)
Prothrombin Time: 43.5 s — ABNORMAL HIGH (ref 11.6–15.2)

## 2012-08-11 LAB — CBC
MCH: 26.8 pg (ref 26.0–34.0)
WBC: 9.8 10*3/uL (ref 4.0–10.5)

## 2012-08-11 NOTE — Progress Notes (Signed)
UR completed 
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
## 2012-08-11 NOTE — Progress Notes (Signed)
Continued pancreatic leakage.  Will consider increasing octreotide dosage  This patient has been seen and I agree with the findings and treatment plan.  Marta Lamas. Gae Bon, MD, FACS [phone] (pager) [phone] (direct pager) Trauma Surgeon

## 2012-08-11 NOTE — Consult Note (Addendum)
Wound care follow-up:  CCS team in to remove vac sponge and assess abd wound.  Reapplied one piece black foam to abd.  Full thickness wound 15X4X1.2cm, beefy red, no odor, mod thick tan dranage in cannister. (250cc) New cannister placed and cont suction on at .  Pt tolerated with minimal discomfort.  Ostomy pouch applied yesterday, intact with good seal.  Bedside nurse to change vac dressing M/W/F   Cammie Mcgee, RN, MSN, [employer]  [phone]
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
## 2012-08-12 LAB — CULTURE, BLOOD (ROUTINE X 2): Culture: NO GROWTH

## 2012-08-12 LAB — CBC
HCT: 28.7 % — ABNORMAL LOW (ref 36.0–46.0)
Hemoglobin: 8.9 g/dL — ABNORMAL LOW (ref 12.0–15.0)
MCH: 26.7 pg (ref 26.0–34.0)
RBC: 3.33 MIL/uL — ABNORMAL LOW (ref 3.87–5.11)

## 2012-08-12 LAB — PROTIME-INR: Prothrombin Time: 37.1 s — ABNORMAL HIGH (ref 11.6–15.2)

## 2012-08-12 MED ORDER — KCL IN DEXTROSE-NACL 20-5-0.45 MEQ/L-%-% IV SOLN
INTRAVENOUS | Status: AC
  Administered 2012-08-12 – 2012-08-13 (×2): via INTRAVENOUS
  Filled 2012-08-12 (×4): qty 1000

## 2012-08-12 MED ORDER — CHLORHEXIDINE GLUCONATE 0.12 % MT SOLN
15.0000 mL | Freq: Two times a day (BID) | OROMUCOSAL | Status: DC
  Administered 2012-08-12 – 2012-08-28 (×28): 15 mL via OROMUCOSAL
  Filled 2012-08-12 (×27): qty 15

## 2012-08-12 MED ORDER — BIOTENE DRY MOUTH MT LIQD
15.0000 mL | Freq: Two times a day (BID) | OROMUCOSAL | Status: DC
  Administered 2012-08-13 – 2012-08-27 (×26): 15 mL via OROMUCOSAL

## 2012-08-12 MED ORDER — HYDROMORPHONE HCL PF 1 MG/ML IJ SOLN
1.0000 mg | INTRAMUSCULAR | Status: DC | PRN
  Administered 2012-08-12: 2 mg via INTRAVENOUS
  Administered 2012-08-12: 1 mg via INTRAVENOUS
  Administered 2012-08-13 (×2): 2 mg via INTRAVENOUS
  Administered 2012-08-13: 1 mg via INTRAVENOUS
  Administered 2012-08-13: 2 mg via INTRAVENOUS
  Administered 2012-08-14 – 2012-08-15 (×4): 1 mg via INTRAVENOUS
  Administered 2012-08-15: 2 mg via INTRAVENOUS
  Administered 2012-08-15 – 2012-08-16 (×4): 1 mg via INTRAVENOUS
  Filled 2012-08-12 (×4): qty 1
  Filled 2012-08-12: qty 2
  Filled 2012-08-12 (×3): qty 1
  Filled 2012-08-12: qty 2
  Filled 2012-08-12: qty 1
  Filled 2012-08-12: qty 2
  Filled 2012-08-12 (×2): qty 1
  Filled 2012-08-12 (×2): qty 2

## 2012-08-12 NOTE — Progress Notes (Signed)
Drainage now more from JP than VAC (Good).  Still <300 cc daily. Hopefully it will slow.  Plan inpatient psych admit once medical issues better. Patient examined and I agree with the assessment and plan  [name], MD, MPH, FACS Pager: [phone]  08/12/2012 12:23 PM
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
## 2012-08-13 ENCOUNTER — Inpatient Hospital Stay (HOSPITAL_COMMUNITY): Payer: 59

## 2012-08-13 LAB — LIPASE, BLOOD: Lipase: 228 U/L — ABNORMAL HIGH (ref 11–59)

## 2012-08-13 LAB — COMPREHENSIVE METABOLIC PANEL
AST: 17 U/L (ref 0–37)
Albumin: 2.2 g/dL — ABNORMAL LOW (ref 3.5–5.2)
Alkaline Phosphatase: 111 U/L (ref 39–117)
BUN: 9 mg/dL (ref 6–23)
Chloride: 101 mEq/L (ref 96–112)
Potassium: 4.3 mEq/L (ref 3.5–5.1)
Total Bilirubin: 0.5 mg/dL (ref 0.3–1.2)

## 2012-08-13 LAB — AMYLASE: Amylase: 603 U/L — ABNORMAL HIGH (ref 0–105)

## 2012-08-13 LAB — CBC
Hemoglobin: 9 g/dL — ABNORMAL LOW (ref 12.0–15.0)
MCH: 26.9 pg (ref 26.0–34.0)
RBC: 3.35 MIL/uL — ABNORMAL LOW (ref 3.87–5.11)

## 2012-08-13 LAB — GLUCOSE, CAPILLARY
Glucose-Capillary: 158 mg/dL — ABNORMAL HIGH (ref 70–99)
Glucose-Capillary: 159 mg/dL — ABNORMAL HIGH (ref 70–99)

## 2012-08-13 LAB — PROTIME-INR: Prothrombin Time: 38.8 s — ABNORMAL HIGH (ref 11.6–15.2)

## 2012-08-13 MED ORDER — IOHEXOL 300 MG/ML  SOLN
100.0000 mL | Freq: Once | INTRAMUSCULAR | Status: AC | PRN
  Administered 2012-08-13: 100 mL via INTRAVENOUS

## 2012-08-13 MED ORDER — IOHEXOL 300 MG/ML  SOLN
20.0000 mL | INTRAMUSCULAR | Status: AC
  Administered 2012-08-13 (×2): 20 mL via ORAL

## 2012-08-13 MED ORDER — POTASSIUM CHLORIDE 2 MEQ/ML IV SOLN
INTRAVENOUS | Status: DC
  Administered 2012-08-13 – 2012-08-16 (×3): via INTRAVENOUS
  Filled 2012-08-13 (×4): qty 1000

## 2012-08-13 MED ORDER — INSULIN ASPART 100 UNIT/ML ~~LOC~~ SOLN
0.0000 [IU] | SUBCUTANEOUS | Status: DC
  Administered 2012-08-13: 2 [IU] via SUBCUTANEOUS
  Administered 2012-08-14: 1 [IU] via SUBCUTANEOUS
  Administered 2012-08-14: 2 [IU] via SUBCUTANEOUS
  Administered 2012-08-14: 1 [IU] via SUBCUTANEOUS
  Administered 2012-08-14 – 2012-08-15 (×4): 2 [IU] via SUBCUTANEOUS
  Administered 2012-08-15: 1 [IU] via SUBCUTANEOUS
  Administered 2012-08-15: 2 [IU] via SUBCUTANEOUS
  Administered 2012-08-16 (×2): 1 [IU] via SUBCUTANEOUS
  Administered 2012-08-16 (×2): 2 [IU] via SUBCUTANEOUS
  Administered 2012-08-16: 1 [IU] via SUBCUTANEOUS
  Administered 2012-08-16 – 2012-08-17 (×3): 2 [IU] via SUBCUTANEOUS
  Administered 2012-08-17 (×3): 1 [IU] via SUBCUTANEOUS
  Administered 2012-08-17 – 2012-08-18 (×2): 2 [IU] via SUBCUTANEOUS
  Administered 2012-08-18: 1 [IU] via SUBCUTANEOUS
  Administered 2012-08-18 (×2): 2 [IU] via SUBCUTANEOUS
  Administered 2012-08-18 – 2012-08-19 (×4): 1 [IU] via SUBCUTANEOUS
  Administered 2012-08-19: 2 [IU] via SUBCUTANEOUS
  Administered 2012-08-19 – 2012-08-20 (×3): 1 [IU] via SUBCUTANEOUS
  Administered 2012-08-20: 2 [IU] via SUBCUTANEOUS
  Administered 2012-08-20 (×2): 1 [IU] via SUBCUTANEOUS
  Administered 2012-08-20: 2 [IU] via SUBCUTANEOUS
  Administered 2012-08-20 – 2012-08-23 (×11): 1 [IU] via SUBCUTANEOUS

## 2012-08-13 MED ORDER — CLINIMIX E/DEXTROSE (5/20) 5 % IV SOLN
INTRAVENOUS | Status: AC
  Administered 2012-08-13: 18:00:00 via INTRAVENOUS
  Filled 2012-08-13: qty 2000

## 2012-08-13 NOTE — Plan of Care (Signed)
Problem: Discharge Progression Outcomes Goal: Tolerating diet Outcome: Not Met (add Reason) tna to start at 1800 08/13/12

## 2012-08-13 NOTE — Progress Notes (Signed)
ANTICOAGULATION CONSULT NOTE   Pharmacy Consult for Coumadin Indication: pulmonary embolus  Labs:  Basename 08/13/12 0500 08/12/12 0515 08/11/12 0638  HGB 9.0* 8.9* --  HCT 28.9* 28.7* 28.1*  PLT 712* 628* 556*  APTT -- -- --  LABPROT 38.8* 37.1* 43.5*  INR 3.90* 3.68* 4.51*  HEPARINUNFRC -- -- --  CREATININE -- -- --  CKTOTAL -- -- --  CKMB -- -- --  TROPONINI -- -- --    Estimated Creatinine Clearance: 89.5 ml/min (by C-G formula based on Cr of 0.9).  Assessment: 41 y.o. F started on heparin + warfarin for new pulmonary embolus this admission. Heparin was d/ced on 8/13 due to large jump of INR into therapeutic range after one dose of warfarin 10 mg on 8/12. The INR remains elevated at 3.90 today which is up from yesterday despite no Warfarin.   Her H/H is stable and no noted complications with bleeding.    It is unknown what caused the large jump in INR. LFTs were WNL on 8/13. It could be related to recent surgeries, diet, and low albumin. Will wait for INR to trend back down prior to resuming warfarin at a much lower dose.   Lab Review: H/H = has remained stable for the last few days.  She has some thrombocytosis with platelets at 712 K and trending higher.  Goal of Therapy:  INR 2-3   Plan:  1. Hold warfarin dose today 2. Will continue to monitor for any signs/symptoms of bleeding and will follow up with PT/INR in the a.m.   Nadara Mustard, PharmD., MS Clinical Pharmacist Pager:  [phone]  Thank you for allowing pharmacy to be part of this patients care team. 08/13/2012 8:54 AM
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
## 2012-08-13 NOTE — Progress Notes (Signed)
13 Days Post-Op  Subjective: Still with moderate sharp and cramping abdominal pain Passing flatus/stool in ostomy Drain output stable with little change  Objective: Vital signs in last 24 hours: Temp:  [98.1 F (36.7 C)-99.6 F (37.6 C)] 99.6 F (37.6 C) (08/18 0435) Pulse Rate:  [60-106] 97  (08/18 0435) Resp:  [16-18] 16  (08/18 0435) BP: (105-115)/(48-66) 115/65 mmHg (08/18 0435) SpO2:  [93 %-98 %] 93 % (08/18 0435) Last BM Date: 08/12/12  Intake/Output from previous day: 08/17 0701 - 08/18 0700 In: 153 [P.O.:25] Out: 235 [Drains:235] Intake/Output this shift:    Mildly uncomfortable Abdomen soft, VAC in place, minimally tender, non distended Ostomy pink Drains serosang  Lab Results:   Basename 08/13/12 0500 08/12/12 0515  WBC 12.8* 9.5  HGB 9.0* 8.9*  HCT 28.9* 28.7*  PLT 712* 628*   BMET No results found for this basename: NA:2,K:2,CL:2,CO2:2,GLUCOSE:2,BUN:2,CREATININE:2,CALCIUM:2 in the last 72 hours PT/INR  Basename 08/13/12 0500 08/12/12 0515  LABPROT 38.8* 37.1*  INR 3.90* 3.68*   ABG No results found for this basename: PHART:2,PCO2:2,PO2:2,HCO3:2 in the last 72 hours  Studies/Results: No results found.  Anti-infectives: Anti-infectives    None      Assessment/Plan: s/p Procedure(s) (LRB): EXPLORATORY LAPAROTOMY (N/A) UPPER GI ENDOSCOPY (N/A) TRANSVERSE COLON RESECTION (N/A) COLOSTOMY (Right)  Will start TNA for nutrition, pancreas rest Given increased WBC and pain, will check lipase and CT abdomen and pelvis to rule out abscess Continue anticoagulation  LOS: 13 days    Shirlena Brinegar A 08/13/2012

## 2012-08-14 ENCOUNTER — Ambulatory Visit: Payer: 59

## 2012-08-14 LAB — COMPREHENSIVE METABOLIC PANEL
ALT: 11 U/L (ref 0–35)
AST: 14 U/L (ref 0–37)
Albumin: 2.2 g/dL — ABNORMAL LOW (ref 3.5–5.2)
CO2: 28 meq/L (ref 19–32)
Calcium: 8.5 mg/dL (ref 8.4–10.5)
Creatinine, Ser: 0.79 mg/dL (ref 0.50–1.10)
Sodium: 135 meq/L (ref 135–145)
Total Protein: 5.9 g/dL — ABNORMAL LOW (ref 6.0–8.3)

## 2012-08-14 LAB — AMYLASE: Amylase: 464 U/L — ABNORMAL HIGH (ref 0–105)

## 2012-08-14 LAB — CBC
MCH: 27.2 pg (ref 26.0–34.0)
MCHC: 31.5 g/dL (ref 30.0–36.0)
Platelets: 758 10*3/uL — ABNORMAL HIGH (ref 150–400)
RBC: 3.24 MIL/uL — ABNORMAL LOW (ref 3.87–5.11)
RDW: 15.7 % — ABNORMAL HIGH (ref 11.5–15.5)

## 2012-08-14 LAB — PHOSPHORUS: Phosphorus: 3 mg/dL (ref 2.3–4.6)

## 2012-08-14 LAB — GLUCOSE, CAPILLARY
Glucose-Capillary: 191 mg/dL — ABNORMAL HIGH (ref 70–99)
Glucose-Capillary: 146 mg/dL — ABNORMAL HIGH (ref 70–99)
Glucose-Capillary: 126 mg/dL — ABNORMAL HIGH (ref 70–99)

## 2012-08-14 LAB — PROTIME-INR: Prothrombin Time: 55.6 s — ABNORMAL HIGH (ref 11.6–15.2)

## 2012-08-14 LAB — DIFFERENTIAL
Basophils Absolute: 0.1 10*3/uL (ref 0.0–0.1)
Lymphocytes Relative: 12 % (ref 12–46)
Lymphs Abs: 1.5 10*3/uL (ref 0.7–4.0)
Neutrophils Relative %: 74 % (ref 43–77)

## 2012-08-14 LAB — MAGNESIUM: Magnesium: 2.2 mg/dL (ref 1.5–2.5)

## 2012-08-14 LAB — LIPASE, BLOOD: Lipase: 186 U/L — ABNORMAL HIGH (ref 11–59)

## 2012-08-14 LAB — CHOLESTEROL, TOTAL: Cholesterol: 136 mg/dL (ref 0–200)

## 2012-08-14 MED ORDER — FAT EMULSION 20 % IV EMUL
250.0000 mL | INTRAVENOUS | Status: AC
  Administered 2012-08-14: 250 mL via INTRAVENOUS
  Filled 2012-08-14: qty 250

## 2012-08-14 MED ORDER — VITAMIN K1 10 MG/ML IJ SOLN
5.0000 mg | Freq: Two times a day (BID) | INTRAVENOUS | Status: DC
  Administered 2012-08-14 (×2): 5 mg via INTRAVENOUS
  Filled 2012-08-14 (×4): qty 0.5

## 2012-08-14 MED ORDER — SELENIUM 40 MCG/ML IV SOLN
INTRAVENOUS | Status: AC
  Administered 2012-08-14: 18:00:00 via INTRAVENOUS
  Filled 2012-08-14: qty 2000

## 2012-08-14 MED ORDER — SODIUM CHLORIDE 0.9 % IJ SOLN
10.0000 mL | INTRAMUSCULAR | Status: DC | PRN
  Administered 2012-08-14 – 2012-08-21 (×9): 10 mL
  Administered 2012-08-22: 20 mL
  Administered 2012-08-23 – 2012-08-29 (×8): 10 mL

## 2012-08-14 NOTE — Progress Notes (Signed)
CRITICAL VALUE ALERT  Critical value received:  PT 55.6,INR 6.18  Date of notification:  08/14/2012  Time of notification:  0728  Critical value read back:yes  Nurse who received alert:  Leonie Man RN  MD notified (1st page): Jimmye Norman  Time of first page:  0729  MD notified (2nd page):  Time of second page:  Responding MD:  Jimmye Norman  Time MD responded:  [phone]

## 2012-08-14 NOTE — Consult Note (Signed)
Wound care follow-up:  Vac dressing changed with Dr Lindie Spruce at the bedside to assess.  Abd full thickness wound 100% beefy red.  Cannister with large amt thick tan drainage, some odor.  Small pinhole in center of abd wound with mod drainage when pressed.  Mod amt discomfort with dressing change after meds given.  One piece black foam applied at cont suction.  Plan for bedside nurse to change on Wed.  Ostomy pouch changed with patient assistance.  200cc liquid brown stool.  Stoma red and viable above skin level.  2 piece pouch applied.  Pt able to empty and close with velcro.  Supplies at bedside for staff use.     Cammie Mcgee, RN, MSN, [employer]  [phone]

## 2012-08-14 NOTE — Progress Notes (Signed)
Nutrition Follow-up  Intervention:   1.  Parenteral nutrition; continued management per PharmD. 2.  Modify diet; Consider trials of clear liquids per MD discretion.  Assessment:   Pt with increased abdominal pain (8/17).  NPO and initiated TPN (8/18).   Patient is receiving TPN with Clinimix E 5/20 @ 50 ml/hr.  Lipids (20% IVFE @ 10 ml/hr), multivitamins, and trace elements are provided 3 times weekly (MWF) due to national backorder.  Provides 1124 kcal and 60 grams protein daily (based on weekly average).  Meets 59% minimum estimated kcal and 67% minimum estimated protein needs.  Additional IVF with D5 1/2 NS @ KVO.  Plans to increase TPN to 80 mL/hr tonight.  Diet Order:  NPO  Meds: Scheduled Meds:   . antiseptic oral rinse  15 mL Mouth Rinse q12n4p  . buPROPion  150 mg Oral Q breakfast  . chlorhexidine  15 mL Mouth Rinse BID  . feeding supplement  30 mL Oral TID WC  . feeding supplement  1 Container Oral TID BM  . insulin aspart  0-9 Units Subcutaneous Q4H  . octreotide  100 mcg Subcutaneous Q12H  . phytonadione (VITAMIN K) IV  5 mg Intravenous BID  . risperiDONE  3 mg Oral QHS  . Warfarin - Pharmacist Dosing Inpatient   Does not apply q1800   Continuous Infusions:   . dextrose 5 % and 0.45 % NaCl with KCl 20 mEq/L 100 mL/hr at 08/13/12 0900  . dextrose 5 %-0.45% nacl with kcl 50 mL/hr at 08/13/12 1851  . fat emulsion    . TPN (CLINIMIX) +/- additives 50 mL/hr at 08/13/12 1746  . TPN (CLINIMIX) +/- additives     PRN Meds:.acetaminophen, diphenhydrAMINE, diphenhydrAMINE, HYDROmorphone (DILAUDID) injection, hydrOXYzine, iohexol, LORazepam, ondansetron (ZOFRAN) IV, oxyCODONE, traZODone  Labs:  CMP     Component Value Date/Time   NA 135 08/14/2012 0615   K 4.3 08/14/2012 0615   CL 100 08/14/2012 0615   CO2 28 08/14/2012 0615   GLUCOSE 148* 08/14/2012 0615   BUN 9 08/14/2012 0615   CREATININE 0.79 08/14/2012 0615   CALCIUM 8.5 08/14/2012 0615   PROT 5.9* 08/14/2012 0615   ALBUMIN 2.2* 08/14/2012 0615   AST 14 08/14/2012 0615   ALT 11 08/14/2012 0615   ALKPHOS 136* 08/14/2012 0615   BILITOT 0.4 08/14/2012 0615   GFRNONAA >90 08/14/2012 0615   GFRAA >90 08/14/2012 0615     Intake/Output Summary (Last 24 hours) at 08/14/12 1252 Last data filed at 08/14/12 1124  Gross per 24 hour  Intake 3029.17 ml  Output    561 ml  Net 2468.17 ml    Weight Status:  No new wt Last known wt: 198 lbs (8/12)  Re-estimated needs:  1900-2100 kcal, 90-100g protein  Nutrition Dx:  1. Inadequate oral intake, ongoing 2.  Nutrition-related knowledge deficit, resolving.  Monitor:   1. Food/Beverage; pt to consume >50% of meals and at least 2 supplements daily.  Not met, pt now NPO for pancreatic rest.  2. Wt/wt change; monitor trends.  Not met, no new wt. 3.  Parenteral nutrition; continued tolerance with pt meeting >90% estimated needs.  Loyce Dys, MS RD LDN Clinical Inpatient Dietitian Pager: [phone] Weekend/After hours pager: [phone]
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
## 2012-08-14 NOTE — Progress Notes (Signed)
UR completed 
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
## 2012-08-15 ENCOUNTER — Inpatient Hospital Stay (HOSPITAL_COMMUNITY): Payer: 59

## 2012-08-15 ENCOUNTER — Encounter (HOSPITAL_COMMUNITY): Payer: Self-pay | Admitting: Radiology

## 2012-08-15 LAB — GLUCOSE, CAPILLARY
Glucose-Capillary: 141 mg/dL — ABNORMAL HIGH (ref 70–99)
Glucose-Capillary: 166 mg/dL — ABNORMAL HIGH (ref 70–99)

## 2012-08-15 LAB — CBC
HCT: 28.2 % — ABNORMAL LOW (ref 36.0–46.0)
Hemoglobin: 8.7 g/dL — ABNORMAL LOW (ref 12.0–15.0)
MCH: 26.5 pg (ref 26.0–34.0)
MCHC: 30.9 g/dL (ref 30.0–36.0)
RDW: 15.6 % — ABNORMAL HIGH (ref 11.5–15.5)

## 2012-08-15 LAB — PROTIME-INR
INR: 1.19 (ref 0.00–1.49)
Prothrombin Time: 15.4 s — ABNORMAL HIGH (ref 11.6–15.2)

## 2012-08-15 MED ORDER — MIDAZOLAM HCL 2 MG/2ML IJ SOLN
INTRAMUSCULAR | Status: AC
  Filled 2012-08-15: qty 4

## 2012-08-15 MED ORDER — FENTANYL CITRATE 0.05 MG/ML IJ SOLN
INTRAMUSCULAR | Status: AC | PRN
  Administered 2012-08-15 (×2): 50 ug via INTRAVENOUS
  Administered 2012-08-15: 25 ug via INTRAVENOUS
  Administered 2012-08-15: 50 ug via INTRAVENOUS
  Administered 2012-08-15 (×2): 25 ug via INTRAVENOUS
  Administered 2012-08-15: 50 ug via INTRAVENOUS

## 2012-08-15 MED ORDER — SODIUM CHLORIDE 0.9 % IV SOLN
1.0000 g | INTRAVENOUS | Status: DC
  Administered 2012-08-15 – 2012-08-21 (×7): 1 g via INTRAVENOUS
  Filled 2012-08-15 (×9): qty 1

## 2012-08-15 MED ORDER — FLUCONAZOLE IN SODIUM CHLORIDE 200-0.9 MG/100ML-% IV SOLN
200.0000 mg | INTRAVENOUS | Status: DC
  Administered 2012-08-15 – 2012-08-24 (×10): 200 mg via INTRAVENOUS
  Filled 2012-08-15 (×12): qty 100

## 2012-08-15 MED ORDER — INSULIN REGULAR HUMAN 100 UNIT/ML IJ SOLN
INTRAVENOUS | Status: AC
  Administered 2012-08-15: 18:00:00 via INTRAVENOUS
  Filled 2012-08-15: qty 2000

## 2012-08-15 MED ORDER — MIDAZOLAM HCL 5 MG/5ML IJ SOLN
INTRAMUSCULAR | Status: AC | PRN
  Administered 2012-08-15: 2 mg via INTRAVENOUS
  Administered 2012-08-15: 0.5 mg via INTRAVENOUS
  Administered 2012-08-15 (×2): 1 mg via INTRAVENOUS
  Administered 2012-08-15: 0.5 mg via INTRAVENOUS

## 2012-08-15 MED ORDER — FENTANYL CITRATE 0.05 MG/ML IJ SOLN
INTRAMUSCULAR | Status: AC
  Filled 2012-08-15: qty 4

## 2012-08-15 NOTE — Progress Notes (Signed)
Patient seen and examined.  Agree with PA's note.  

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
## 2012-08-15 NOTE — Progress Notes (Signed)
Have collaborated with this note. 08/15/2012  Ken Quinlyn Tep, PT [phone] (pager)  
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
## 2012-08-15 NOTE — Procedures (Signed)
CT guided drain of two intraabdominal abscesses.  #1 Drain in left upper quadrant, near pancreas.  #2 drain in left lower anterior abdomen.  Removed 50 ml of yellow purulent fluid from #1 and 45 ml of clay colored fluid from #2.  No immediate complication.

## 2012-08-16 DIAGNOSIS — F603 Borderline personality disorder: Secondary | ICD-10-CM | POA: Diagnosis present

## 2012-08-16 LAB — CBC
HCT: 27.7 % — ABNORMAL LOW (ref 36.0–46.0)
Hemoglobin: 8.7 g/dL — ABNORMAL LOW (ref 12.0–15.0)
MCV: 85.8 fL (ref 78.0–100.0)
WBC: 13.5 10*3/uL — ABNORMAL HIGH (ref 4.0–10.5)

## 2012-08-16 LAB — GLUCOSE, CAPILLARY
Glucose-Capillary: 180 mg/dL — ABNORMAL HIGH (ref 70–99)
Glucose-Capillary: 151 mg/dL — ABNORMAL HIGH (ref 70–99)

## 2012-08-16 LAB — PROTIME-INR: INR: 1.17 (ref 0.00–1.49)

## 2012-08-16 MED ORDER — ENOXAPARIN SODIUM 100 MG/ML ~~LOC~~ SOLN
1.0000 mg/kg | Freq: Two times a day (BID) | SUBCUTANEOUS | Status: DC
  Administered 2012-08-16 – 2012-08-22 (×13): 85 mg via SUBCUTANEOUS
  Administered 2012-08-22: 100 mg via SUBCUTANEOUS
  Administered 2012-08-23 – 2012-08-25 (×5): 85 mg via SUBCUTANEOUS
  Filled 2012-08-16 (×20): qty 1

## 2012-08-16 MED ORDER — WARFARIN - PHYSICIAN DOSING INPATIENT: Freq: Every day | Status: DC

## 2012-08-16 MED ORDER — WARFARIN SODIUM 2 MG PO TABS
2.0000 mg | ORAL_TABLET | Freq: Once | ORAL | Status: AC
  Administered 2012-08-16: 2 mg via ORAL
  Filled 2012-08-16: qty 1

## 2012-08-16 MED ORDER — NAPROXEN 500 MG PO TABS
500.0000 mg | ORAL_TABLET | Freq: Two times a day (BID) | ORAL | Status: DC
  Administered 2012-08-16 – 2012-08-31 (×29): 500 mg via ORAL
  Filled 2012-08-16 (×37): qty 1

## 2012-08-16 MED ORDER — HYDROMORPHONE HCL PF 1 MG/ML IJ SOLN
0.5000 mg | INTRAMUSCULAR | Status: DC | PRN
  Administered 2012-08-16 – 2012-08-30 (×30): 0.5 mg via INTRAVENOUS
  Filled 2012-08-16 (×30): qty 1

## 2012-08-16 MED ORDER — ZINC TRACE METAL 1 MG/ML IV SOLN
INTRAVENOUS | Status: AC
  Administered 2012-08-16: 18:00:00 via INTRAVENOUS
  Filled 2012-08-16: qty 2000

## 2012-08-16 MED ORDER — OXYCODONE HCL 5 MG PO TABS
10.0000 mg | ORAL_TABLET | ORAL | Status: DC | PRN
  Administered 2012-08-16 (×2): 15 mg via ORAL
  Administered 2012-08-19 – 2012-08-21 (×5): 10 mg via ORAL
  Administered 2012-08-22 – 2012-08-23 (×2): 20 mg via ORAL
  Administered 2012-08-24 – 2012-08-25 (×3): 15 mg via ORAL
  Administered 2012-08-26 – 2012-08-27 (×2): 10 mg via ORAL
  Administered 2012-08-27: 20 mg via ORAL
  Administered 2012-08-27: 10 mg via ORAL
  Administered 2012-08-28 – 2012-08-29 (×5): 15 mg via ORAL
  Administered 2012-08-29 – 2012-08-30 (×5): 10 mg via ORAL
  Filled 2012-08-16: qty 3
  Filled 2012-08-16: qty 2
  Filled 2012-08-16: qty 4
  Filled 2012-08-16 (×3): qty 2
  Filled 2012-08-16: qty 3
  Filled 2012-08-16: qty 4
  Filled 2012-08-16 (×4): qty 2
  Filled 2012-08-16: qty 3
  Filled 2012-08-16: qty 2
  Filled 2012-08-16: qty 3
  Filled 2012-08-16 (×2): qty 2
  Filled 2012-08-16: qty 4
  Filled 2012-08-16: qty 2
  Filled 2012-08-16 (×4): qty 3
  Filled 2012-08-16: qty 2
  Filled 2012-08-16 (×3): qty 3

## 2012-08-16 MED ORDER — OCTREOTIDE ACETATE 100 MCG/ML IJ SOLN
200.0000 ug | Freq: Three times a day (TID) | INTRAMUSCULAR | Status: DC
  Administered 2012-08-16 – 2012-09-01 (×46): 200 ug via SUBCUTANEOUS
  Filled 2012-08-16 (×57): qty 2

## 2012-08-16 MED ORDER — FAT EMULSION 20 % IV EMUL
250.0000 mL | INTRAVENOUS | Status: AC
  Administered 2012-08-16: 250 mL via INTRAVENOUS
  Filled 2012-08-16: qty 250
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
## 2012-08-16 NOTE — Consult Note (Addendum)
Wound care follow-up:  Assisted bedside nurse with vac dressing change.  Full thickness wound beefy red, mod thick tan drainage in cannister.  No odor, pt c/o discomfort all the time, not just with dressing change.  2 pinhole openings visible to wound bed. 13.5X3X.6cm. One piece black sponge applied to cont suction.  Colostomy pouch intact with good seal, mod liquid brown stool. Bedside nurse to change Friday.  Cammie Mcgee, RN, MSN, [employer]  [phone]
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
## 2012-08-16 NOTE — Progress Notes (Signed)
Nutrition Follow-up  Intervention:   1.  Supplements; d/c of all PO supplements as pt is not expected to advance diet soon per RN. 2. Parenteral nutrition; continued management per PharmD.  If pt continues to lose wt, may need to adjust kcal provision to prevent further loss.  Pre-mixed TPN and limited availability of lipids hinder ability to meet kcal needs.  Pt would likely tolerate increased protein based on values of last lab draw (8/19).  Assessment:   Patient is receiving TPN with Clinimix E 5/20 @ 80 ml/hr.  Lipids (20% IVFE @ 10 ml/hr), multivitamins, and trace elements are provided 3 times weekly (MWF) due to national backorder.  Provides 1758 kcal and 96 grams protein daily (based on weekly average).  Meets 92% minimum estimated kcal and 100% minimum estimated protein needs.  Additional IVF with D5 1/2 NS @ KVO.  Discussed with RN who reports pt expected to remain NPO with no diet advancement planned due to drainage and abdominal pain. Pt started with sips today.  RN requests d/c of all PO supplements as they are not compliant with current diet order and no advancement expected.  RD to d/c, will resume as needed once PO advanced with tolerance.  Diet Order:  NPO  Meds: Scheduled Meds:   . antiseptic oral rinse  15 mL Mouth Rinse q12n4p  . buPROPion  150 mg Oral Q breakfast  . chlorhexidine  15 mL Mouth Rinse BID  . enoxaparin (LOVENOX) injection  1 mg/kg Subcutaneous Q12H  . ertapenem  1 g Intravenous Q24H  . fentaNYL      . fluconazole (DIFLUCAN) IV  200 mg Intravenous Q24H  . insulin aspart  0-9 Units Subcutaneous Q4H  . midazolam      . naproxen  500 mg Oral BID WC  . octreotide  200 mcg Subcutaneous Q8H  . risperiDONE  3 mg Oral QHS  . warfarin  2 mg Oral ONCE-1800  . Warfarin - Physician Dosing Inpatient   Does not apply q1800  . DISCONTD: feeding supplement  30 mL Oral TID WC  . DISCONTD: feeding supplement  1 Container Oral TID BM  . DISCONTD: octreotide  100 mcg  Subcutaneous Q12H  . DISCONTD: Warfarin - Pharmacist Dosing Inpatient   Does not apply q1800   Continuous Infusions:   . dextrose 5 %-0.45% nacl with kcl 20 mL/hr at 08/14/12 1353  . fat emulsion 250 mL (08/14/12 1742)  . fat emulsion    . TPN (CLINIMIX) +/- additives 80 mL/hr at 08/14/12 1742  . TPN (CLINIMIX) +/- additives 80 mL/hr at 08/15/12 1810  . TPN (CLINIMIX) +/- additives     PRN Meds:.acetaminophen, diphenhydrAMINE, diphenhydrAMINE, fentaNYL, HYDROmorphone (DILAUDID) injection, hydrOXYzine, LORazepam, midazolam, ondansetron (ZOFRAN) IV, oxyCODONE, sodium chloride, traZODone, DISCONTD:  HYDROmorphone (DILAUDID) injection, DISCONTD: oxyCODONE  Labs:  CMP     Component Value Date/Time   NA 135 08/14/2012 0615   K 4.3 08/14/2012 0615   CL 100 08/14/2012 0615   CO2 28 08/14/2012 0615   GLUCOSE 148* 08/14/2012 0615   BUN 9 08/14/2012 0615   CREATININE 0.79 08/14/2012 0615   CALCIUM 8.5 08/14/2012 0615   PROT 5.9* 08/14/2012 0615   ALBUMIN 2.2* 08/14/2012 0615   AST 14 08/14/2012 0615   ALT 11 08/14/2012 0615   ALKPHOS 136* 08/14/2012 0615   BILITOT 0.4 08/14/2012 0615   GFRNONAA >90 08/14/2012 0615   GFRAA >90 08/14/2012 0615     Intake/Output Summary (Last 24 hours) at 08/16/12 1154 Last  data filed at 08/16/12 0600  Gross per 24 hour  Intake   2540 ml  Output    868 ml  Net   1672 ml    Weight Status:  Decreasing trend. Admission wt :190 lbs Current wt: 186 lbs Pt with 2% wt loss since admission ~ 2 weeks ago.  Re-estimated needs:  1900-2100 kcal, 90-100g  Nutrition Dx:  Inadequate oral intake, ongoing  Monitor:   1. Food/Beverage; diet advancement once appropriate per MD. Not met, pt now NPO for pancreatic rest.  2. Wt/wt change; monitor trends. Not met, no new wt.  3. Parenteral nutrition; continued tolerance with pt meeting >90% estimated needs. Met, ongoing.  Pt meeting 92% of needs but with wt loss.  May need to target maximal estimated needs.   Loyce Dys,  MS RD LDN Clinical Inpatient Dietitian Pager: [phone] Weekend/After hours pager: [phone]

## 2012-08-16 NOTE — Progress Notes (Signed)
She is having subjectively more pain, but seems stable.  Will let patient have ice chips.  This patient has been seen and I agree with the findings and treatment plan.  [name] [name]. Gae Bon, MD, FACS [phone] (pager) [phone] (direct pager) Trauma Surgeon
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
## 2012-08-17 LAB — CBC
HCT: 27.3 % — ABNORMAL LOW (ref 36.0–46.0)
MCH: 26.8 pg (ref 26.0–34.0)
MCV: 86.9 fL (ref 78.0–100.0)
Platelets: 725 10*3/uL — ABNORMAL HIGH (ref 150–400)
RDW: 15.6 % — ABNORMAL HIGH (ref 11.5–15.5)
WBC: 11 10*3/uL — ABNORMAL HIGH (ref 4.0–10.5)

## 2012-08-17 LAB — COMPREHENSIVE METABOLIC PANEL
ALT: 20 U/L (ref 0–35)
AST: 23 U/L (ref 0–37)
Alkaline Phosphatase: 230 U/L — ABNORMAL HIGH (ref 39–117)
CO2: 27 meq/L (ref 19–32)
Calcium: 8.6 mg/dL (ref 8.4–10.5)
Chloride: 104 meq/L (ref 96–112)
GFR calc Af Amer: >90 mL/min (ref >90)
GFR calc non Af Amer: >90 mL/min (ref >90)
Glucose, Bld: 159 mg/dL — ABNORMAL HIGH (ref 70–99)
Potassium: 4.3 meq/L (ref 3.5–5.1)
Sodium: 139 meq/L (ref 135–145)
Total Bilirubin: 0.4 mg/dL (ref 0.3–1.2)

## 2012-08-17 LAB — GLUCOSE, CAPILLARY
Glucose-Capillary: 159 mg/dL — ABNORMAL HIGH (ref 70–99)
Glucose-Capillary: 149 mg/dL — ABNORMAL HIGH (ref 70–99)
Glucose-Capillary: 141 mg/dL — ABNORMAL HIGH (ref 70–99)

## 2012-08-17 LAB — PROTIME-INR: INR: 1.1 (ref 0.00–1.49)

## 2012-08-17 MED ORDER — WARFARIN SODIUM 5 MG PO TABS
5.0000 mg | ORAL_TABLET | Freq: Once | ORAL | Status: AC
  Administered 2012-08-17: 5 mg via ORAL
  Filled 2012-08-17: qty 1

## 2012-08-17 MED ORDER — SODIUM CHLORIDE 0.9 % IV SOLN
INTRAVENOUS | Status: DC
  Administered 2012-08-17: 09:00:00 via INTRAVENOUS
  Administered 2012-08-18: 10 mL/h via INTRAVENOUS
  Administered 2012-08-22: 1000 mL via INTRAVENOUS
  Administered 2012-08-24: 20 mL/h via INTRAVENOUS
  Administered 2012-08-26: 05:00:00 via INTRAVENOUS
  Administered 2012-08-30: 20 mL/h via INTRAVENOUS

## 2012-08-17 MED ORDER — CLINIMIX E/DEXTROSE (5/20) 5 % IV SOLN
INTRAVENOUS | Status: AC
  Administered 2012-08-17: 17:00:00 via INTRAVENOUS
  Filled 2012-08-17: qty 2000

## 2012-08-17 MED ORDER — CHLORHEXIDINE GLUCONATE 0.12 % MT SOLN
OROMUCOSAL | Status: AC
  Administered 2012-08-17: 15 mL
  Filled 2012-08-17: qty 15
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
## 2012-08-17 NOTE — Progress Notes (Signed)
If we can get the pancreatic drainage to subside non-operatively this would be much better for the patient.  Continue NPO.  This patient has been seen and I agree with the findings and treatment plan.  Marta Lamas. Gae Bon, MD, FACS [phone] (pager) [phone] (direct pager) Trauma Surgeon

## 2012-08-17 NOTE — Progress Notes (Signed)
UR complete 
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
## 2012-08-18 LAB — CBC
MCH: 26.8 pg (ref 26.0–34.0)
MCV: 86.3 fL (ref 78.0–100.0)
Platelets: 730 10*3/uL — ABNORMAL HIGH (ref 150–400)
RBC: 3.14 MIL/uL — ABNORMAL LOW (ref 3.87–5.11)
RDW: 15.5 % (ref 11.5–15.5)
WBC: 11.6 10*3/uL — ABNORMAL HIGH (ref 4.0–10.5)

## 2012-08-18 LAB — GLUCOSE, CAPILLARY
Glucose-Capillary: 132 mg/dL — ABNORMAL HIGH (ref 70–99)
Glucose-Capillary: 137 mg/dL — ABNORMAL HIGH (ref 70–99)
Glucose-Capillary: 137 mg/dL — ABNORMAL HIGH (ref 70–99)
Glucose-Capillary: 130 mg/dL — ABNORMAL HIGH (ref 70–99)

## 2012-08-18 LAB — PROTIME-INR: Prothrombin Time: 16.3 s — ABNORMAL HIGH (ref 11.6–15.2)

## 2012-08-18 MED ORDER — FAT EMULSION 20 % IV EMUL
250.0000 mL | INTRAVENOUS | Status: AC
  Administered 2012-08-18: 250 mL via INTRAVENOUS
  Filled 2012-08-18: qty 250

## 2012-08-18 MED ORDER — WARFARIN SODIUM 5 MG PO TABS
5.0000 mg | ORAL_TABLET | Freq: Every day | ORAL | Status: AC
  Administered 2012-08-18: 5 mg via ORAL
  Filled 2012-08-18: qty 1

## 2012-08-18 MED ORDER — ZINC TRACE METAL 1 MG/ML IV SOLN
INTRAVENOUS | Status: AC
  Administered 2012-08-18: 17:00:00 via INTRAVENOUS
  Filled 2012-08-18: qty 2000

## 2012-08-18 MED ORDER — RISPERIDONE 3 MG PO TABS
3.0000 mg | ORAL_TABLET | Freq: Once | ORAL | Status: AC
  Administered 2012-08-18: 3 mg via ORAL

## 2012-08-18 NOTE — Progress Notes (Signed)
18 Days Post-Op  Subjective: LLQ abd drains #3 and #$ placed in IR 8/20 Pt better Up in chair; ambulating   Objective: Vital signs in last 24 hours: Temp:  [97.4 F (36.3 C)-99.2 F (37.3 C)] 98 F (36.7 C) (08/23 0945) Pulse Rate:  [84-107] 107  (08/23 0945) Resp:  [16-18] 18  (08/23 0945) BP: (102-129)/(57-74) 102/62 mmHg (08/23 0945) SpO2:  [96 %-99 %] 98 % (08/23 0945) Last BM Date: 08/18/12  Intake/Output from previous day: 08/22 0701 - 08/23 0700 In: 2510 [P.O.:480; I.V.:110; TPN:1920] Out: 2225 [Urine:2100; Drains:125] Intake/Output this shift: Total I/O In: -  Out: 600 [Urine:500; Stool:100]  PE:  #3 drain intact; output 35 cc yesterday; sl reddish brown fluid         #4 drain intact: output 30 cc yesterday; milky yellow fluid cxs rare candida Sites are clean and dry; nt; no bleeding Afeb; vss  Lab Results:   [facility] 08/18/12 0935 08/17/12 0553  WBC 11.6* 11.0*  HGB 8.4* 8.4*  HCT 27.1* 27.3*  PLT 730* 725*   BMET  Basename 08/17/12 0553  NA 139  K 4.3  CL 104  CO2 27  GLUCOSE 159*  BUN 15  CREATININE 0.77  CALCIUM 8.6   PT/INR  Basename 08/18/12 0935 08/17/12 0553  LABPROT 16.3* 14.4  INR 1.29 1.10   ABG No results found for this basename: PHART:2,PCO2:2,PO2:2,HCO3:2 in the last 72 hours  Studies/Results: No results found.  Anti-infectives: Anti-infectives     Start     Dose/Rate Route Frequency Ordered Stop   08/15/12 1830   ertapenem (INVANZ) 1 g in sodium chloride 0.9 % 50 mL IVPB        1 g 100 mL/hr over 30 Minutes Intravenous Every 24 hours 08/15/12 1652     08/15/12 1730   fluconazole (DIFLUCAN) IVPB 200 mg        200 mg 100 mL/hr over 60 Minutes Intravenous Every 24 hours 08/15/12 1652            Assessment/Plan: s/p Procedure(s) (LRB): EXPLORATORY LAPAROTOMY (N/A) UPPER GI ENDOSCOPY (N/A) TRANSVERSE COLON RESECTION (N/A) COLOSTOMY (Right)  Drains intact Pt feels better Will follow Plan per  CCS  Remo Kirschenmann A 08/18/2012

## 2012-08-18 NOTE — Progress Notes (Addendum)
Pt is extremely upset. Crying and saying that she is hearing voices. Patient says that the voices are telling her to leave because someone is going to hurt her. Pt requested that I call husband to come back to the hospital. Rn asked if the voices are telling her to hurt herself and patient did not answer. Patient says that she has been hearing voices all day long. Rn asked patient if earlier dose of Ativan helped with the voices and patient agreed that it did. Rn administered 1 mg ativan to help with anxiety. Patient also requested that Risperdal dose be increased. Rn called MD on call and orders were written. Rn will continue to monitor patient.

## 2012-08-18 NOTE — Progress Notes (Signed)
Drain output has dropped off significantly, but the quality of the drainage is different in the different drains.  Only growth is Candida.  As long as she has no fevers and her WBC continues to improve I believe that the patient is improving.  This patient has been seen and I agree with the findings and treatment plan.  Marta Lamas. Gae Bon, MD, FACS [phone] (pager) [phone] (direct pager) Trauma Surgeon
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
## 2012-08-19 LAB — GLUCOSE, CAPILLARY
Glucose-Capillary: 126 mg/dL — ABNORMAL HIGH (ref 70–99)
Glucose-Capillary: 127 mg/dL — ABNORMAL HIGH (ref 70–99)
Glucose-Capillary: 133 mg/dL — ABNORMAL HIGH (ref 70–99)
Glucose-Capillary: 142 mg/dL — ABNORMAL HIGH (ref 70–99)
Glucose-Capillary: 154 mg/dL — ABNORMAL HIGH (ref 70–99)

## 2012-08-19 LAB — CBC
HCT: 28 % — ABNORMAL LOW (ref 36.0–46.0)
Hemoglobin: 8.6 g/dL — ABNORMAL LOW (ref 12.0–15.0)
MCV: 86.7 fL (ref 78.0–100.0)
Platelets: 682 10*3/uL — ABNORMAL HIGH (ref 150–400)
RBC: 3.23 MIL/uL — ABNORMAL LOW (ref 3.87–5.11)
WBC: 11 10*3/uL — ABNORMAL HIGH (ref 4.0–10.5)

## 2012-08-19 LAB — BASIC METABOLIC PANEL
CO2: 27 meq/L (ref 19–32)
Calcium: 8.9 mg/dL (ref 8.4–10.5)
Chloride: 102 meq/L (ref 96–112)
Glucose, Bld: 143 mg/dL — ABNORMAL HIGH (ref 70–99)
Potassium: 4.3 meq/L (ref 3.5–5.1)
Sodium: 137 meq/L (ref 135–145)

## 2012-08-19 LAB — PROTIME-INR: INR: 1.26 (ref 0.00–1.49)

## 2012-08-19 MED ORDER — INSULIN REGULAR HUMAN 100 UNIT/ML IJ SOLN
INTRAVENOUS | Status: AC
  Administered 2012-08-19: 18:00:00 via INTRAVENOUS
  Filled 2012-08-19: qty 2000

## 2012-08-19 MED ORDER — WARFARIN SODIUM 5 MG PO TABS
5.0000 mg | ORAL_TABLET | Freq: Every day | ORAL | Status: AC
  Administered 2012-08-19: 5 mg via ORAL
  Filled 2012-08-19: qty 1

## 2012-08-19 NOTE — Progress Notes (Signed)
About the same.  Continue drainage.
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
## 2012-08-20 LAB — GLUCOSE, CAPILLARY: Glucose-Capillary: 137 mg/dL — ABNORMAL HIGH (ref 70–99)

## 2012-08-20 LAB — PROTIME-INR: INR: 1.34 (ref 0.00–1.49)

## 2012-08-20 MED ORDER — WARFARIN - PHARMACIST DOSING INPATIENT
Freq: Every day | Status: DC
  Administered 2012-08-29 – 2012-08-30 (×2)
  Administered 2012-08-31: 1

## 2012-08-20 MED ORDER — WARFARIN SODIUM 5 MG PO TABS
5.0000 mg | ORAL_TABLET | Freq: Once | ORAL | Status: AC
  Administered 2012-08-20: 5 mg via ORAL
  Filled 2012-08-20: qty 1

## 2012-08-20 MED ORDER — INSULIN REGULAR HUMAN 100 UNIT/ML IJ SOLN
INTRAVENOUS | Status: AC
  Administered 2012-08-20: 18:00:00 via INTRAVENOUS
  Filled 2012-08-20: qty 2000

## 2012-08-20 NOTE — Progress Notes (Signed)
Called Dr. Lindie Spruce to obtain order for physician dosing coumadin.  Received a telephone order to discontinue physician dosing and switch to pharmacy consult.  [name] [name]

## 2012-08-20 NOTE — Progress Notes (Signed)
ANTICOAGULATION CONSULT NOTE - Follow Up Consult  Pharmacy Consult for Coumadin Indication: pulmonary embolus  Allergies  Allergen Reactions  . Augmentin (Amoxicillin-Pot Clavulanate) Itching, Swelling and Rash  . Avelox (Moxifloxacin Hcl In Nacl) Itching, Swelling and Rash    Patient Measurements: Height: 5\' 4"  (162.6 cm) Weight: 186 lb 12.8 oz (84.732 kg) IBW/kg (Calculated) : 54.7    Vital Signs: Temp: 97.6 F (36.4 C) (08/25 1400) Temp src: Oral (08/25 1400) BP: 108/51 mmHg (08/25 1400) Pulse Rate: 82  (08/25 1400)  Labs:  Basename 08/20/12 0500 08/19/12 0500 08/18/12 0935  HGB -- 8.6* 8.4*  HCT -- 28.0* 27.1*  PLT -- 682* 730*  APTT -- -- --  LABPROT 16.8* 16.1* 16.3*  INR 1.34 1.26 1.29  HEPARINUNFRC -- -- --  CREATININE -- 0.87 --  CKTOTAL -- -- --  CKMB -- -- --  TROPONINI -- -- --    Estimated Creatinine Clearance: 89.6 ml/min (by C-G formula based on Cr of 0.87).  Assessment: Patient is a 41 y.o F on coumadin for PE.  OK for pharmacy to resume dosing coumadin.  INR is subtherapeutic but is trending up with 5mg  regimen.  Goal of Therapy:  INR 2-3   Plan:  1) Coumadin 5mg  PO x1 today.  [name] [name] P 08/20/2012,4:58 PM
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
## 2012-08-20 NOTE — Progress Notes (Signed)
20 Days Post-Op  Subjective: IR placed LLQ drains #3 and #4 8/20 Some better; lethargic today  Objective: Vital signs in last 24 hours: Temp:  [97.7 F (36.5 C)-98.3 F (36.8 C)] 98.3 F (36.8 C) (08/25 0617) Pulse Rate:  [65-81] 72  (08/25 0617) Resp:  [16] 16  (08/25 0617) BP: (95-103)/(51-70) 103/61 mmHg (08/25 0617) SpO2:  [97 %-99 %] 97 % (08/25 0617) Last BM Date: 08/19/12  Intake/Output from previous day: 08/24 0701 - 08/25 0700 In: 1799.5 [I.V.:156; TPN:1633.5] Out: 3203 [Urine:2950; Drains:103; Stool:150] Intake/Output this shift:    PE:  Afeb; vss Output #3: serous : 15 cc yesterday Output #4: milky yellow : 45 cc yesterday Cxs yeast Site clean and dry; slight tender Wbc trending down  Lab Results:   Basename 08/19/12 0500 08/18/12 0935  WBC 11.0* 11.6*  HGB 8.6* 8.4*  HCT 28.0* 27.1*  PLT 682* 730*   BMET  Basename 08/19/12 0500  NA 137  K 4.3  CL 102  CO2 27  GLUCOSE 143*  BUN 18  CREATININE 0.87  CALCIUM 8.9   PT/INR  Basename 08/20/12 0500 08/19/12 0500  LABPROT 16.8* 16.1*  INR 1.34 1.26   ABG No results found for this basename: PHART:2,PCO2:2,PO2:2,HCO3:2 in the last 72 hours  Studies/Results: No results found.  Anti-infectives: Anti-infectives     Start     Dose/Rate Route Frequency Ordered Stop   08/15/12 1830   ertapenem (INVANZ) 1 g in sodium chloride 0.9 % 50 mL IVPB        1 g 100 mL/hr over 30 Minutes Intravenous Every 24 hours 08/15/12 1652     08/15/12 1730   fluconazole (DIFLUCAN) IVPB 200 mg        200 mg 100 mL/hr over 60 Minutes Intravenous Every 24 hours 08/15/12 1652            Assessment/Plan: s/p Procedure(s) (LRB): EXPLORATORY LAPAROTOMY (N/A) UPPER GI ENDOSCOPY (N/A) TRANSVERSE COLON RESECTION (N/A) COLOSTOMY (Right)  LLQ drains #3 and #4 followed by IR Output decreasing Will need CT when output less than 15 cc /day Plan per CCS  Berenise Hunton A 08/20/2012
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
## 2012-08-21 LAB — CBC
MCH: 26.7 pg (ref 26.0–34.0)
MCHC: 30.7 g/dL (ref 30.0–36.0)
MCV: 87 fL (ref 78.0–100.0)
Platelets: 553 10*3/uL — ABNORMAL HIGH (ref 150–400)
RDW: 15.9 % — ABNORMAL HIGH (ref 11.5–15.5)

## 2012-08-21 LAB — GLUCOSE, CAPILLARY
Glucose-Capillary: 115 mg/dL — ABNORMAL HIGH (ref 70–99)
Glucose-Capillary: 143 mg/dL — ABNORMAL HIGH (ref 70–99)
Glucose-Capillary: 146 mg/dL — ABNORMAL HIGH (ref 70–99)

## 2012-08-21 LAB — DIFFERENTIAL
Basophils Absolute: 0.1 10*3/uL (ref 0.0–0.1)
Basophils Relative: 1 % (ref 0–1)
Eosinophils Absolute: 0.3 10*3/uL (ref 0.0–0.7)
Eosinophils Relative: 4 % (ref 0–5)
Neutrophils Relative %: 62 % (ref 43–77)

## 2012-08-21 LAB — PREALBUMIN: Prealbumin: 19.3 mg/dL (ref 17.0–34.0)

## 2012-08-21 LAB — COMPREHENSIVE METABOLIC PANEL
ALT: 19 U/L (ref 0–35)
Alkaline Phosphatase: 264 U/L — ABNORMAL HIGH (ref 39–117)
BUN: 22 mg/dL (ref 6–23)
CO2: 29 meq/L (ref 19–32)
GFR calc Af Amer: >90 mL/min (ref >90)
GFR calc non Af Amer: 79 mL/min — ABNORMAL LOW (ref >90)
Glucose, Bld: 120 mg/dL — ABNORMAL HIGH (ref 70–99)
Potassium: 4.3 meq/L (ref 3.5–5.1)
Sodium: 140 meq/L (ref 135–145)

## 2012-08-21 LAB — CULTURE, ROUTINE-ABSCESS

## 2012-08-21 LAB — MAGNESIUM: Magnesium: 2.3 mg/dL (ref 1.5–2.5)

## 2012-08-21 MED ORDER — ZINC TRACE METAL 1 MG/ML IV SOLN
INTRAVENOUS | Status: AC
  Administered 2012-08-21: 18:00:00 via INTRAVENOUS
  Filled 2012-08-21: qty 2000

## 2012-08-21 MED ORDER — WARFARIN SODIUM 1 MG PO TABS
1.0000 mg | ORAL_TABLET | Freq: Once | ORAL | Status: DC
  Filled 2012-08-21: qty 1

## 2012-08-21 MED ORDER — FAT EMULSION 20 % IV EMUL
240.0000 mL | INTRAVENOUS | Status: AC
  Administered 2012-08-21: 240 mL via INTRAVENOUS
  Filled 2012-08-21: qty 250

## 2012-08-21 NOTE — Progress Notes (Signed)
Delvin Hedeen, MD, MPH, FACS Pager: [phone]  

## 2012-08-21 NOTE — Progress Notes (Signed)
Patient ID: [name] [name], female   DOB: 08-Jun-1971, 41 y.o.   MRN: [id number] Social worker for psych would like to take patient outside to allow for change of location to help patient with mood and to aid in counseling efforts.  This would be ok with surgery under several circumstances.  The patient would only be allowed to go with the social worker and would have to remain in a wheelchair throughout the trip and not be allowed to stand or get out of wheelchair and that the patient's sitter would need to come with the patient as well.  The sitter would be allowed to remain away from the patient during the counseling sessions to maintain patient privacy but would need to remain in eye contact of the patient at all times.  [name] 08/21/2012 4:11 PM

## 2012-08-21 NOTE — Progress Notes (Signed)
21 Days Post-Op  Subjective: Pt sitting up in chair; reports less abd pain; no N/V  Objective: Vital signs in last 24 hours: Temp:  [97.6 F (36.4 C)-98.2 F (36.8 C)] 98.2 F (36.8 C) (08/26 0539) Pulse Rate:  [77-83] 83  (08/26 0539) Resp:  [15-18] 18  (08/26 0539) BP: (91-108)/(51-61) 91/61 mmHg (08/26 0539) SpO2:  [97 %-100 %] 98 % (08/26 0539) Last BM Date: 08/20/12  Intake/Output from previous day: 08/25 0701 - 08/26 0700 In: 1164 [I.V.:120; IV Piggyback:150; TPN:884] Out: 4065 [Urine:3850; Drains:195; Stool:20] Intake/Output this shift: Total I/O In: -  Out: 150 [Urine:150]  LLQ drains intact, outputs #3- 40 cc's, #4- 85 cc's last 24 hrs cream-colored fluid, cx's -candida  Lab Results:   Basename 08/21/12 0525 08/19/12 0500  WBC 8.6 11.0*  HGB 8.4* 8.6*  HCT 27.4* 28.0*  PLT 553* 682*   BMET  Basename 08/21/12 0525 08/19/12 0500  NA 140 137  K 4.3 4.3  CL 106 102  CO2 29 27  GLUCOSE 120* 143*  BUN 22 18  CREATININE 0.89 0.87  CALCIUM 9.0 8.9   PT/INR  Basename 08/21/12 0525 08/20/12 0500  LABPROT 26.0* 16.8*  INR 2.34* 1.34   ABG No results found for this basename: PHART:2,PCO2:2,PO2:2,HCO3:2 in the last 72 hours Results for orders placed during the hospital encounter of 07/31/12  MRSA PCR SCREENING     Status: Normal   Collection Time   07/31/12  5:48 PM      Component Value Range Status Comment   MRSA by PCR NEGATIVE  NEGATIVE Final   CULTURE, BLOOD (ROUTINE X 2)     Status: Normal   Collection Time   08/06/12  9:56 AM      Component Value Range Status Comment   Specimen Description BLOOD RIGHT HAND   Final    Special Requests BOTTLES DRAWN AEROBIC AND ANAEROBIC 10CC   Final    Culture  Setup Time 08/06/2012 13:27   Final    Culture NO GROWTH 5 DAYS   Final    Report Status 08/12/2012 FINAL   Final   CULTURE, BLOOD (ROUTINE X 2)     Status: Normal   Collection Time   08/06/12 10:01 AM      Component Value Range Status Comment   Specimen  Description BLOOD LEFT HAND   Final    Special Requests BOTTLES DRAWN AEROBIC ONLY 10CC   Final    Culture  Setup Time 08/06/2012 13:27   Final    Culture NO GROWTH 5 DAYS   Final    Report Status 08/12/2012 FINAL   Final   CULTURE, ROUTINE-ABSCESS     Status: Normal   Collection Time   08/15/12  1:18 PM      Component Value Range Status Comment   Specimen Description ABSCESS ABDOMEN   Final    Special Requests LEFT UPPER   Final    Gram Stain     Final    Value: FEW WBC PRESENT,BOTH PMN AND MONONUCLEAR     RARE SQUAMOUS EPITHELIAL CELLS PRESENT     RARE GRAM NEGATIVE RODS   Culture RARE CANDIDA ALBICANS   Final    Report Status 08/18/2012 FINAL   Final   CULTURE, ROUTINE-ABSCESS     Status: Normal   Collection Time   08/15/12  1:48 PM      Component Value Range Status Comment   Specimen Description ABSCESS ABDOMEN   Final    Special Requests LEFT  LOWER ANTERIOR   Final    Gram Stain     Final    Value: ABUNDANT WBC PRESENT, PREDOMINANTLY PMN     NO SQUAMOUS EPITHELIAL CELLS SEEN     FEW GRAM NEGATIVE RODS     RARE GRAM POSITIVE COCCI IN PAIRS   Culture RARE YEAST CONSISTENT WITH CANDIDA SPECIES   Final    Report Status 08/18/2012 FINAL   Final     Studies/Results: No results found.  Anti-infectives: Anti-infectives     Start     Dose/Rate Route Frequency Ordered Stop   08/15/12 1830   ertapenem (INVANZ) 1 g in sodium chloride 0.9 % 50 mL IVPB        1 g 100 mL/hr over 30 Minutes Intravenous Every 24 hours 08/15/12 1652     08/15/12 1730   fluconazole (DIFLUCAN) IVPB 200 mg        200 mg 100 mL/hr over 60 Minutes Intravenous Every 24 hours 08/15/12 1652            Assessment/Plan: s/p LLQ abscess drainages x2   8/20; check f/u CT later this week with drain inj       LOS: 21 days    Ryn Peine,D [facility] 08/21/2012
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
## 2012-08-21 NOTE — Progress Notes (Signed)
Progress Note: Counseling  Called to unit this AM after patient had follow up with medical team. Patient very tearful and depressed because of her duration and continued duration at the hospital. Patient reports she just wants to go home and be with her family and she feels as if the walls are closing on her in the hospital.  Patient reports she really needs some help and she is struggling.  Patient continues to have SI and auditory hallucinations per her report.  During this session, focus remains of feelings for patient as well as how people, events and situations make her feels. DBT techniques utilized as patient was asked several questions around her purpose and what is important to her as she remains in the hospital.  Patient struggled with answering questions and expressing her feelings. Patient continues to report she does not understand why she is so emotional and tearful.  Education around emotions, event and coping were provided.  Patient has been in hospital now 21 days. Discussed with medical team the option of taking patient off unit and outside to give her new perspective and outlook.  Patient's mood changed drastically when she was told this could happen and expressed some happiness per her report and behaviors.  Discussed some interventions with patient while being outside that patient and CSW will work towards. Patient compliant. Patient to remain in wheelchair along with sitter to accompany while being outside, for additional support if needed. Patient agreeable with plan.  Discussed with Trauma Team and also agreeable to write order to go off unit and outside with CSW supervision.   Patient continues to be medically unstable at this time. No disposition at this time due to medical problems. Still working and researching best outcome for patient and will also work to reach patient's counselor for additional insight for DBT therapy and interventions to support patient while in  hospital and at discharge. Husband remains involved with patient and visits on a daily basis. Son has returned to college and daughter began high school today 08/21/2012.  Patient depressed about events, but did have a phone call of friends in the community coming to visit. Will see patient tomorrow and continue counseling.  Will also discuss patient in LOS tomorrow.  Ashley Jacobs, MSW LCSW [phone]

## 2012-08-21 NOTE — Progress Notes (Signed)
Less tearful now, drain output about the same.  I also spoke to Dr. [name] at the bedside. Patient examined and I agree with the assessment and plan  [name], MD, MPH, FACS Pager: [phone]  08/21/2012 2:32 PM

## 2012-08-21 NOTE — Consult Note (Signed)
Ostomy follow-up:   Demonstrated pouch change procedure.  Stoma red and viable, above skin level, mod brown liquid stool in pouch.  Pt is familiar with pouching routines and emptying from multiple previous pouch education sessions.  Able to open and close with velcro. Denies further questions at this time.  Asks appropriate questions.  Supplies in room for staff use.  Wound care follow-up: Vac dressing changed.  Wound greatly improved.  Pin hole opening no longer present in wound.  Beefy red with minimal pink drainage in cannister, no odor.  12X2X.3cm.  One piece black foam applied at cont suction.  Pt denies discomfort with procedure and states wound is much less tender that previous week.  Bedside nurse to change Q M/W/F.  Cammie Mcgee, RN, MSN, [employer]  [phone]

## 2012-08-21 NOTE — Progress Notes (Signed)
UR complete 

## 2012-08-21 NOTE — Progress Notes (Signed)
Progress Note:   Pt is seen 3:30 pm 08/21/12  Major computer dysfunction for all notes  Pt is visiting with husband and appears distressed. She said she is experiencing less pain than before affect they had changed dressings and she required some pain medication. She is very upset today because this is the first day of school for her daughter who is entering high school. She is looking forward to hearing from her and husband assures her that they will visit this evening. It is inquired whether her pet dog may visit and her husband is given the specifications required of any dogs as being patients. She is very emotional today and and says she is hearing voices that are more narrow chief and not so of disturbing. She recognizes that when she is emotionally upset the voices are more evident. the opportunity to go outside is something she is counting on. The purpose of discussing DBT is reviewed for her to have a better grasp of her emotions and learning how to remain calm rather than react to the anxiety or extreme distress she experiences. She is looking forward to visit from her daughter and the dog him whom she identifies as "pet therapy".  Diagnosis: Axis I:  Schizoaffective disorder with psychotic features, suicide attempt. Axis II borderline personality disorder Axis IV  close emotional ties with family members, anxiety, medical problems and healing process. Axis V GAF 50  RECOMMENDATION: 1.  Agree with plan to contact friends and arrange visits: Also visit with pet dog and daughter.. 2. Agree with Psych CSW 2 plan session on DBT and take patient to patio 3.  Continue in encouragement to recognize anxiety and practice relaxation skills. 4.  We'll continue to follow patient [name] [name] J. [name], MD Psychiatrist  08/21/2012 11:58 PM
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
## 2012-08-22 LAB — GLUCOSE, CAPILLARY
Glucose-Capillary: 123 mg/dL — ABNORMAL HIGH (ref 70–99)
Glucose-Capillary: 140 mg/dL — ABNORMAL HIGH (ref 70–99)

## 2012-08-22 MED ORDER — INSULIN REGULAR HUMAN 100 UNIT/ML IJ SOLN
INTRAVENOUS | Status: AC
  Administered 2012-08-22: 18:00:00 via INTRAVENOUS
  Filled 2012-08-22: qty 2000

## 2012-08-22 MED ORDER — WARFARIN SODIUM 2.5 MG PO TABS
2.5000 mg | ORAL_TABLET | Freq: Once | ORAL | Status: AC
  Administered 2012-08-22: 2.5 mg via ORAL
  Filled 2012-08-22: qty 1

## 2012-08-22 NOTE — Progress Notes (Signed)
22 Days Post-Op  Subjective: LLQ abscess #3 and #4 placed inIR 8/20 Still with sig output  Objective: Vital signs in last 24 hours: Temp:  [97.2 F (36.2 C)-98.7 F (37.1 C)] 98.7 F (37.1 C) (08/27 0522) Pulse Rate:  [63-81] 65  (08/27 0522) Resp:  [18] 18  (08/27 0522) BP: (88-104)/(46-59) 100/59 mmHg (08/27 0522) SpO2:  [96 %-97 %] 97 % (08/27 0522) Last BM Date: 08/21/12  Intake/Output from previous day: 08/26 0701 - 08/27 0700 In: 2645.7 [I.V.:475.9; TPN:2149.8] Out: 3882 [Urine:3800; Drains:82] Intake/Output this shift: Total I/O In: -  Out: 600 [Urine:600]  PE:  Afeb; vss Wbc wnl Output #3: reddish/ milky - 25 cc 8/26 #4: milky yellow - 40 cc 8/26 Sites clean and dry    Lab Results:   [facility] 08/21/12 0525  WBC 8.6  HGB 8.4*  HCT 27.4*  PLT 553*   BMET  Basename 08/21/12 0525  NA 140  K 4.3  CL 106  CO2 29  GLUCOSE 120*  BUN 22  CREATININE 0.89  CALCIUM 9.0   PT/INR  Basename 08/22/12 0610 08/21/12 0525  LABPROT 22.2* 26.0*  INR 1.91* 2.34*   ABG No results found for this basename: PHART:2,PCO2:2,PO2:2,HCO3:2 in the last 72 hours  Studies/Results: No results found.  Anti-infectives: Anti-infectives     Start     Dose/Rate Route Frequency Ordered Stop   08/15/12 1830   ertapenem (INVANZ) 1 g in sodium chloride 0.9 % 50 mL IVPB  Status:  Discontinued        1 g 100 mL/hr over 30 Minutes Intravenous Every 24 hours 08/15/12 1652 08/22/12 0829   08/15/12 1730   fluconazole (DIFLUCAN) IVPB 200 mg        200 mg 100 mL/hr over 60 Minutes Intravenous Every 24 hours 08/15/12 1652            Assessment/Plan: s/p Procedure(s) (LRB): EXPLORATORY LAPAROTOMY (N/A) UPPER GI ENDOSCOPY (N/A) TRANSVERSE COLON RESECTION (N/A) COLOSTOMY (Right)  Will follow drains for now Plan per CCS Will need re CT when output minimal  Garin Mata A 08/22/2012
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
## 2012-08-22 NOTE — Progress Notes (Signed)
Noted resumption of drainage from [facility] with new connector.  Patient visiting with her dog and the psychiatry SW off the floor. [name], MD, MPH, FACS Pager: [phone]
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
## 2012-08-22 NOTE — Progress Notes (Signed)
Patient able to go outside today to complete counseling session. Patient very excited and thrilled to be outside. Able to express her feelings using her five senses and express how much better she feels by being outside.  Counseling session included patient and CSW discussing education behind borderline personality disorder and reviewing 5 traits to Borderline Personality and criteria. Patient was able to ask questions and verbalize understanding regarding diagnosis.  Patient and CSW also worked on patient expressing her event last night with Sam (her dog coming to visit).  Patient was able to tell how she felt and why she felt that way.  DBT was utilized during session in which patient was in control of her feelings and validated by CSW.  Patient given handouts and resources regarding DBT and information about Borderline to review and read until next session.  Patient also given notebook/journal to write thoughts and feelings through out the day.  Homework for patient was given to review "ways of thinking" and will discuss thoughts and questions patient may have in tomorrows counseling session.  Patient tolerated being outside very well. She expressed how good the warm air felt and overall her mood has improved. Denies SI at this time, however patient reports it comes and goes (passively). Reports her daughter had a great first day of school which allowed stress patient was feeling to go away. Patient to have some community friends come to visit today and she expresses excitement. Patient mother and husband/children remain involved.   Will follow up with patient on 8/28 to continue DBT Therapy and support. Medical issues still being problematic with disposition.  Ashley Jacobs, MSW LCSW [phone]

## 2012-08-22 NOTE — Progress Notes (Addendum)
Nutrition Follow-up  Intervention:   1.  Parenteral nutrition; continued management per PharmD. 2.  Please obtain new wt with pt's back to scale or using bed scale and clear reading after documenting value.  Assessment:   Pt unavailable at time of visit- meeting off floor.  Pt remains NPO with increased drain output. Continues to experience pain. Decreased output documented from ostomy.  Patient is receiving TPN with Clinimix E 5/20 @ 80 ml/hr.  Lipids (20% IVFE @ 10 ml/hr), multivitamins, and trace elements are provided 3 times weekly (MWF) due to national backorder.  Provides 1895 kcal and 96 grams protein daily (based on weekly average).  Meets 100% minimum estimated kcal and 100% minimum estimated protein needs.  Additional IVF with NS @ KVO.  RD notes per initial SW notes, that pt has hx of eating disorder.  Fear of wt gain with psych medication side effects results in pt stopping medication use.   RD will continue to follow for diet advancement and pt meeting nutrition needs PO.   Diet Order:  NPO  Meds: Scheduled Meds:   . antiseptic oral rinse  15 mL Mouth Rinse q12n4p  . buPROPion  150 mg Oral Q breakfast  . chlorhexidine  15 mL Mouth Rinse BID  . enoxaparin (LOVENOX) injection  1 mg/kg Subcutaneous Q12H  . fluconazole (DIFLUCAN) IV  200 mg Intravenous Q24H  . insulin aspart  0-9 Units Subcutaneous Q4H  . naproxen  500 mg Oral BID WC  . octreotide  200 mcg Subcutaneous Q8H  . risperiDONE  3 mg Oral QHS  . warfarin  2.5 mg Oral ONCE-1800  . Warfarin - Pharmacist Dosing Inpatient   Does not apply q1800  . DISCONTD: ertapenem  1 g Intravenous Q24H   Continuous Infusions:   . sodium chloride 10 mL/hr (08/18/12 1637)  . fat emulsion 240 mL (08/21/12 1757)  . TPN (CLINIMIX) +/- additives 80 mL/hr at 08/20/12 1825  . TPN (CLINIMIX) +/- additives 80 mL/hr at 08/21/12 1756  . TPN (CLINIMIX) +/- additives     PRN Meds:.acetaminophen, diphenhydrAMINE, diphenhydrAMINE,  HYDROmorphone (DILAUDID) injection, hydrOXYzine, LORazepam, ondansetron (ZOFRAN) IV, oxyCODONE, sodium chloride, traZODone  Labs:  CMP     Component Value Date/Time   NA 140 08/21/2012 0525   K 4.3 08/21/2012 0525   CL 106 08/21/2012 0525   CO2 29 08/21/2012 0525   GLUCOSE 120* 08/21/2012 0525   BUN 22 08/21/2012 0525   CREATININE 0.89 08/21/2012 0525   CALCIUM 9.0 08/21/2012 0525   PROT 6.1 08/21/2012 0525   ALBUMIN 2.6* 08/21/2012 0525   AST 22 08/21/2012 0525   ALT 19 08/21/2012 0525   ALKPHOS 264* 08/21/2012 0525   BILITOT 0.3 08/21/2012 0525   GFRNONAA 79* 08/21/2012 0525   GFRAA >90 08/21/2012 0525     Intake/Output Summary (Last 24 hours) at 08/22/12 1056 Last data filed at 08/22/12 0840  Gross per 24 hour  Intake 2645.7 ml  Output   4132 ml  Net -1486.3 ml    Weight Status:  No new wt RD unable to obtain due to pt being off the floor for therapy/family visit  Re-estimated needs:  1900-2100 kcal, 90-100g  Nutrition Dx:  Inadequate oral intake, ongoing  Monitor:   1. Food/Beverage; diet advancement once appropriate per MD. Not met, pt now NPO for pancreatic rest.  2. Wt/wt change; monitor trends. Not met, no new wt.  3. Parenteral nutrition; continued tolerance with pt meeting >90% estimated needs. Met, ongoing. Pt meeting 100%  of needs but with wt loss. May need to target maximal estimated needs.   Loyce Dys, MS RD LDN Clinical Inpatient Dietitian Pager: [phone] Weekend/After hours pager: [phone]

## 2012-08-23 DIAGNOSIS — F319 Bipolar disorder, unspecified: Secondary | ICD-10-CM

## 2012-08-23 LAB — GLUCOSE, CAPILLARY
Glucose-Capillary: 126 mg/dL — ABNORMAL HIGH (ref 70–99)
Glucose-Capillary: 131 mg/dL — ABNORMAL HIGH (ref 70–99)
Glucose-Capillary: 123 mg/dL — ABNORMAL HIGH (ref 70–99)

## 2012-08-23 LAB — PROTIME-INR
INR: 1.88 — ABNORMAL HIGH (ref 0.00–1.49)
Prothrombin Time: 21.9 s — ABNORMAL HIGH (ref 11.6–15.2)

## 2012-08-23 MED ORDER — INSULIN ASPART 100 UNIT/ML ~~LOC~~ SOLN
0.0000 [IU] | Freq: Three times a day (TID) | SUBCUTANEOUS | Status: DC
Start: 2012-08-23 — End: 2012-08-30
  Administered 2012-08-23 – 2012-08-25 (×4): 1 [IU] via SUBCUTANEOUS
  Administered 2012-08-26: 18:00:00 via SUBCUTANEOUS
  Administered 2012-08-26: 1 [IU] via SUBCUTANEOUS
  Administered 2012-08-26: 2 [IU] via SUBCUTANEOUS
  Administered 2012-08-27 – 2012-08-28 (×2): 1 [IU] via SUBCUTANEOUS
  Administered 2012-08-28: 2 [IU] via SUBCUTANEOUS
  Administered 2012-08-28 – 2012-08-30 (×4): 1 [IU] via SUBCUTANEOUS

## 2012-08-23 MED ORDER — WARFARIN SODIUM 5 MG PO TABS
5.0000 mg | ORAL_TABLET | Freq: Once | ORAL | Status: AC
  Administered 2012-08-23: 5 mg via ORAL
  Filled 2012-08-23: qty 1

## 2012-08-23 MED ORDER — FAT EMULSION 20 % IV EMUL
240.0000 mL | INTRAVENOUS | Status: AC
  Administered 2012-08-23: 240 mL via INTRAVENOUS
  Filled 2012-08-23: qty 250

## 2012-08-23 MED ORDER — ZINC TRACE METAL 1 MG/ML IV SOLN
INTRAVENOUS | Status: AC
  Administered 2012-08-23: 18:00:00 via INTRAVENOUS
  Filled 2012-08-23: qty 2000

## 2012-08-23 NOTE — Consult Note (Signed)
Wound care follow-up:  Bedside nurse changed vac dressing to abd yesterday when suction not functioning properly.  Bedside nurses can continue to change on a T/TH/Sat schedule.  Cammie Mcgee, RN, MSN, [employer]  [phone]
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
## 2012-08-23 NOTE — Progress Notes (Signed)
Output is down to where I think she can take a few clear liquids.  Will look to see if this adversely affects her output.  This patient has been seen and I agree with the findings and treatment plan.  Marta Lamas. Gae Bon, MD, FACS [phone] (pager) [phone] (direct pager) Trauma Surgeon

## 2012-08-24 LAB — PROTIME-INR
INR: 2.42 — ABNORMAL HIGH (ref 0.00–1.49)
Prothrombin Time: 26.7 s — ABNORMAL HIGH (ref 11.6–15.2)

## 2012-08-24 LAB — COMPREHENSIVE METABOLIC PANEL
ALT: 15 U/L (ref 0–35)
AST: 16 U/L (ref 0–37)
Albumin: 2.7 g/dL — ABNORMAL LOW (ref 3.5–5.2)
Alkaline Phosphatase: 220 U/L — ABNORMAL HIGH (ref 39–117)
Chloride: 105 meq/L (ref 96–112)
Creatinine, Ser: 0.92 mg/dL (ref 0.50–1.10)
Potassium: 4.2 meq/L (ref 3.5–5.1)
Sodium: 139 meq/L (ref 135–145)
Total Bilirubin: 0.2 mg/dL — ABNORMAL LOW (ref 0.3–1.2)

## 2012-08-24 LAB — GLUCOSE, CAPILLARY
Glucose-Capillary: 142 mg/dL — ABNORMAL HIGH (ref 70–99)
Glucose-Capillary: 123 mg/dL — ABNORMAL HIGH (ref 70–99)

## 2012-08-24 LAB — PHOSPHORUS: Phosphorus: 3.2 mg/dL (ref 2.3–4.6)

## 2012-08-24 MED ORDER — INSULIN REGULAR HUMAN 100 UNIT/ML IJ SOLN
INTRAMUSCULAR | Status: AC
  Administered 2012-08-24: 17:00:00 via INTRAVENOUS
  Filled 2012-08-24: qty 2000

## 2012-08-24 MED ORDER — WARFARIN SODIUM 2.5 MG PO TABS
2.5000 mg | ORAL_TABLET | Freq: Once | ORAL | Status: AC
  Administered 2012-08-24: 2.5 mg via ORAL
  Filled 2012-08-24 (×2): qty 1
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
## 2012-08-24 NOTE — Progress Notes (Signed)
Clinical Social Work Progress Note PSYCHIATRY SERVICE LINE 08/24/2012  Patient:  [name] [name]  Account:  [id number]  Admit Date:  07/31/2012  Clinical Social Worker:  Ashley Jacobs, LCSW  Date/Time:  08/24/2012 12:13 PM  Review of Patient  Overall Medical Condition:   Overall patient sitting up in chair, tolerated [place] ice and liquids. Currently she is having chicken broth.  Patient mother also in room and checked in with patient about thoughts, depression, etc.  Patient's mood remains the same, depressed and flat.  Patient struggles with making eye contact and verbalizing feelings.  Mother in room, which could be a reason to why patient struggling with check in. Will come back to see patient.   Participation Level:  Minimal  Participation Quality  Guarded   Other Participation Quality:   had to be encouraged to speak.  Struggled with questions   Affect  Depressed  Flat   Cognitive  Oriented   Reaction to Medications/Concerns:   will reasses   Modes of Intervention  Support   Summary of Progress/Plan at Discharge   1. Will remain on case and re-attempt to engage patient after family has left.  Patient excited about getting her hair washed, will follow up this afternoon if time allows.   Ashley Jacobs, MSW LCSW [phone]
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
## 2012-08-24 NOTE — Progress Notes (Signed)
Nutrition Follow-up  Intervention:   1.  Parenteral nutrition; continued management per PharmD.  Pt has lost 1 lb since last week.  If pt to start clear liquids soon pt will likely meet increased needs with combination of clear liquids and TPN.  If NPO expected to continue with pain/drain output, will continue to monitor wt status. 2.  Modify diet; per MD discretion based on pt tolerance  Assessment:   Pt not available at time of visit- not in room.  New wt obtained yesterday and found to be 185 lbs which is a loss of 1 lb since last week.   Estimated needs may be further increased due to continued drain output.  RD will continue to follow wts to ensure pt is meeting needs.  May need to target maximum of estimated needs range to prevent further wt loss.  Patient is receiving TPN with Clinimix E 5/20 @ 80 ml/hr.  Lipids (20% IVFE @ 10 ml/hr), multivitamins, and trace elements are provided 3 times weekly (MWF) due to national backorder.  Provides 1895 kcal and 96 grams protein daily (based on weekly average).  Meets 100% minimum estimated kcal and 100% minimum estimated protein needs.  Diet Order:  NPO, sips of clears  Meds: Scheduled Meds:   . antiseptic oral rinse  15 mL Mouth Rinse q12n4p  . buPROPion  150 mg Oral Q breakfast  . chlorhexidine  15 mL Mouth Rinse BID  . enoxaparin (LOVENOX) injection  1 mg/kg Subcutaneous Q12H  . fluconazole (DIFLUCAN) IV  200 mg Intravenous Q24H  . insulin aspart  0-9 Units Subcutaneous Q8H  . naproxen  500 mg Oral BID WC  . octreotide  200 mcg Subcutaneous Q8H  . risperiDONE  3 mg Oral QHS  . warfarin  2.5 mg Oral ONCE-1800  . warfarin  5 mg Oral ONCE-1800  . Warfarin - Pharmacist Dosing Inpatient   Does not apply q1800   Continuous Infusions:   . sodium chloride 20 mL/hr (08/24/12 0350)  . fat emulsion 240 mL (08/23/12 1737)  . TPN (CLINIMIX) +/- additives 80 mL/hr at 08/22/12 1744  . TPN (CLINIMIX) +/- additives 80 mL/hr at 08/23/12 1736  . TPN  (CLINIMIX) +/- additives     PRN Meds:.acetaminophen, diphenhydrAMINE, diphenhydrAMINE, HYDROmorphone (DILAUDID) injection, hydrOXYzine, LORazepam, ondansetron (ZOFRAN) IV, oxyCODONE, sodium chloride, traZODone  Labs:  CMP     Component Value Date/Time   NA 139 08/24/2012 0600   K 4.2 08/24/2012 0600   CL 105 08/24/2012 0600   CO2 26 08/24/2012 0600   GLUCOSE 137* 08/24/2012 0600   BUN 25* 08/24/2012 0600   CREATININE 0.92 08/24/2012 0600   CALCIUM 8.6 08/24/2012 0600   PROT 6.0 08/24/2012 0600   ALBUMIN 2.7* 08/24/2012 0600   AST 16 08/24/2012 0600   ALT 15 08/24/2012 0600   ALKPHOS 220* 08/24/2012 0600   BILITOT 0.2* 08/24/2012 0600   GFRNONAA 76* 08/24/2012 0600   GFRAA 88* 08/24/2012 0600     Intake/Output Summary (Last 24 hours) at 08/24/12 1104 Last data filed at 08/24/12 0747  Gross per 24 hour  Intake   1030 ml  Output   2501 ml  Net  -1471 ml    Weight Status:  185 lbs, loss of 1 lb since last week  Re-estimated needs:  1900-2100 kcal, 90-100g protein  Nutrition Dx:  Inadequate oral intake, ongoing  Monitor:   1. Food/Beverage; diet advancement once appropriate per MD. Not met, pt now NPO for pancreatic rest.  2. Wt/wt  change; monitor trends. Met, pt with questionable wt loss.  3. Parenteral nutrition; continued tolerance with pt meeting >90% estimated needs. Met, ongoing. Pt meeting 100% of needs but with wt loss. May need to target maximal estimated needs.   Loyce Dys, MS RD LDN Clinical Inpatient Dietitian Pager: [phone] Weekend/After hours pager: [phone]

## 2012-08-24 NOTE — Progress Notes (Signed)
CHANGED COLOSTOMY RESULTED TO LEAKAGE; STOOL LOOSE, BROWN.

## 2012-08-25 ENCOUNTER — Inpatient Hospital Stay (HOSPITAL_COMMUNITY): Payer: 59

## 2012-08-25 DIAGNOSIS — D62 Acute posthemorrhagic anemia: Secondary | ICD-10-CM

## 2012-08-25 DIAGNOSIS — K8689 Other specified diseases of pancreas: Secondary | ICD-10-CM

## 2012-08-25 LAB — GLUCOSE, CAPILLARY
Glucose-Capillary: 129 mg/dL — ABNORMAL HIGH (ref 70–99)
Glucose-Capillary: 130 mg/dL — ABNORMAL HIGH (ref 70–99)

## 2012-08-25 LAB — PROTIME-INR: Prothrombin Time: 25.8 s — ABNORMAL HIGH (ref 11.6–15.2)

## 2012-08-25 MED ORDER — SELENIUM 40 MCG/ML IV SOLN
INTRAVENOUS | Status: AC
  Administered 2012-08-25: 18:00:00 via INTRAVENOUS
  Filled 2012-08-25: qty 2000

## 2012-08-25 MED ORDER — IOHEXOL 300 MG/ML  SOLN
20.0000 mL | INTRAMUSCULAR | Status: AC
  Administered 2012-08-25: 20 mL via ORAL

## 2012-08-25 MED ORDER — IOHEXOL 300 MG/ML  SOLN
80.0000 mL | Freq: Once | INTRAMUSCULAR | Status: AC | PRN
  Administered 2012-08-25: 80 mL via INTRAVENOUS

## 2012-08-25 MED ORDER — FAT EMULSION 20 % IV EMUL
240.0000 mL | INTRAVENOUS | Status: AC
  Administered 2012-08-25: 240 mL via INTRAVENOUS
  Filled 2012-08-25: qty 250

## 2012-08-25 MED ORDER — WARFARIN SODIUM 2.5 MG PO TABS
2.5000 mg | ORAL_TABLET | Freq: Once | ORAL | Status: AC
  Administered 2012-08-25: 2.5 mg via ORAL
  Filled 2012-08-25: qty 1
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
## 2012-08-25 NOTE — Progress Notes (Signed)
Ambulating with therapy.  Drain output decreasing.  Will see how CT looks. Patient examined and I agree with the assessment and plan  [name], MD, MPH, FACS Pager: [phone]  08/25/2012 12:46 PM
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
## 2012-08-26 LAB — GLUCOSE, CAPILLARY
Glucose-Capillary: 124 mg/dL — ABNORMAL HIGH (ref 70–99)
Glucose-Capillary: 102 mg/dL — ABNORMAL HIGH (ref 70–99)

## 2012-08-26 LAB — CBC
Hemoglobin: 8.4 g/dL — ABNORMAL LOW (ref 12.0–15.0)
MCH: 26.6 pg (ref 26.0–34.0)
MCV: 86.7 fL (ref 78.0–100.0)
RBC: 3.16 MIL/uL — ABNORMAL LOW (ref 3.87–5.11)

## 2012-08-26 LAB — BASIC METABOLIC PANEL
CO2: 27 meq/L (ref 19–32)
Calcium: 8.8 mg/dL (ref 8.4–10.5)
Glucose, Bld: 144 mg/dL — ABNORMAL HIGH (ref 70–99)
Potassium: 3.7 meq/L (ref 3.5–5.1)
Sodium: 140 meq/L (ref 135–145)

## 2012-08-26 MED ORDER — CLINIMIX E/DEXTROSE (5/20) 5 % IV SOLN
INTRAVENOUS | Status: AC
  Administered 2012-08-26: 18:00:00 via INTRAVENOUS
  Filled 2012-08-26: qty 2000

## 2012-08-26 MED ORDER — WARFARIN SODIUM 2.5 MG PO TABS
2.5000 mg | ORAL_TABLET | Freq: Once | ORAL | Status: AC
  Administered 2012-08-26: 2.5 mg via ORAL
  Filled 2012-08-26: qty 1
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
## 2012-08-27 LAB — GLUCOSE, CAPILLARY
Glucose-Capillary: 136 mg/dL — ABNORMAL HIGH (ref 70–99)
Glucose-Capillary: 129 mg/dL — ABNORMAL HIGH (ref 70–99)
Glucose-Capillary: 167 mg/dL — ABNORMAL HIGH (ref 70–99)

## 2012-08-27 MED ORDER — WARFARIN SODIUM 2.5 MG PO TABS
2.5000 mg | ORAL_TABLET | Freq: Every day | ORAL | Status: DC
  Administered 2012-08-27: 2.5 mg via ORAL
  Filled 2012-08-27 (×2): qty 1

## 2012-08-27 MED ORDER — INSULIN REGULAR HUMAN 100 UNIT/ML IJ SOLN
INTRAVENOUS | Status: AC
  Administered 2012-08-27: 17:00:00 via INTRAVENOUS
  Filled 2012-08-27: qty 2000
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
## 2012-08-28 LAB — GLUCOSE, CAPILLARY

## 2012-08-28 LAB — DIFFERENTIAL
Eosinophils Relative: 5 % (ref 0–5)
Lymphocytes Relative: 17 % (ref 12–46)
Lymphs Abs: 1.2 10*3/uL (ref 0.7–4.0)
Neutro Abs: 4.7 10*3/uL (ref 1.7–7.7)

## 2012-08-28 LAB — PROTIME-INR: INR: 3.41 — ABNORMAL HIGH (ref 0.00–1.49)

## 2012-08-28 LAB — CBC
MCV: 86.4 fL (ref 78.0–100.0)
Platelets: 258 10*3/uL (ref 150–400)
RBC: 3.09 MIL/uL — ABNORMAL LOW (ref 3.87–5.11)
WBC: 7.2 10*3/uL (ref 4.0–10.5)

## 2012-08-28 LAB — COMPREHENSIVE METABOLIC PANEL
ALT: 24 U/L (ref 0–35)
AST: 22 U/L (ref 0–37)
Calcium: 8.8 mg/dL (ref 8.4–10.5)
GFR calc Af Amer: 90 mL/min — ABNORMAL LOW (ref >90)
Glucose, Bld: 120 mg/dL — ABNORMAL HIGH (ref 70–99)
Sodium: 140 meq/L (ref 135–145)
Total Protein: 6.1 g/dL (ref 6.0–8.3)

## 2012-08-28 LAB — TRIGLYCERIDES: Triglycerides: 156 mg/dL — ABNORMAL HIGH (ref <150)

## 2012-08-28 LAB — PREALBUMIN: Prealbumin: 18.9 mg/dL (ref 17.0–34.0)

## 2012-08-28 LAB — CHOLESTEROL, TOTAL: Cholesterol: 143 mg/dL (ref 0–200)

## 2012-08-28 MED ORDER — ZINC TRACE METAL 1 MG/ML IV SOLN
INTRAVENOUS | Status: AC
  Administered 2012-08-28: 17:00:00 via INTRAVENOUS
  Filled 2012-08-28: qty 2000

## 2012-08-28 MED ORDER — FAT EMULSION 20 % IV EMUL
240.0000 mL | INTRAVENOUS | Status: AC
  Administered 2012-08-28: 240 mL via INTRAVENOUS
  Filled 2012-08-28: qty 250
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
## 2012-08-28 NOTE — Progress Notes (Signed)
Psych still following patient for counseling and assisting with emotional needs. Over weekend and Friday, patient given permission to color to help distract attention and give other ways of coping with being in the hospital. Patient still receiving TPN/feeding tube, is improving however not eating a regular diet. Called [facility] with regards to admission status and if they would approve a feeding tube. This was declined, reports in rare cases patient can come on a G-tube. Patient remains in hospital currently being only safe dc plan.  Will remain on case and continue assisting patient with need and counseling. Will continue to find appropriate disposition for patient within the inpatient setting. Will follow up 9/3 with patient for counseling and update trauma team.  Ashley Jacobs, MSW LCSW [phone]

## 2012-08-29 ENCOUNTER — Ambulatory Visit (INDEPENDENT_AMBULATORY_CARE_PROVIDER_SITE_OTHER): Payer: 59 | Admitting: General Surgery

## 2012-08-29 LAB — GLUCOSE, CAPILLARY: Glucose-Capillary: 133 mg/dL — ABNORMAL HIGH (ref 70–99)

## 2012-08-29 LAB — PROTIME-INR
INR: 2.53 — ABNORMAL HIGH (ref 0.00–1.49)
Prothrombin Time: 27.7 s — ABNORMAL HIGH (ref 11.6–15.2)

## 2012-08-29 MED ORDER — WARFARIN SODIUM 2.5 MG PO TABS
2.5000 mg | ORAL_TABLET | Freq: Once | ORAL | Status: AC
  Administered 2012-08-29: 2.5 mg via ORAL
  Filled 2012-08-29: qty 1

## 2012-08-29 MED ORDER — INSULIN REGULAR HUMAN 100 UNIT/ML IJ SOLN
INTRAVENOUS | Status: DC
  Administered 2012-08-29: 17:00:00 via INTRAVENOUS
  Filled 2012-08-29: qty 2000

## 2012-08-29 MED ORDER — RISPERIDONE 3 MG PO TABS
3.0000 mg | ORAL_TABLET | Freq: Every day | ORAL | Status: DC
  Administered 2012-08-29 – 2012-08-31 (×3): 3 mg via ORAL
  Filled 2012-08-29 (×4): qty 1

## 2012-08-29 NOTE — Progress Notes (Signed)
Clinical Social Work Progress Note PSYCHIATRY SERVICE LINE 08/29/2012  Patient:  [name] [name]  Account:  [id number]  Admit Date:  07/31/2012  Clinical Social Worker:  Ashley Jacobs, LCSW  Date/Time:  08/29/2012 11:27 AM  Review of Patient  Overall Medical Condition:   Patient remains on 6N and appears to be doing well. Patient sitting in chair, reports drain #4 has a lot of output since they repositioned her, but all the others are the same.  Patient reports she was able to start eating solid food, but has only eaten half of a muffin.  Reports her weekend was eventful with both kids coming to visit along with her husband. Reports she was able to go outside and the family dog "cowboy Sam" also came and that made patient very happy.  Patient reports no thoughts of sucide nor hearing any voices since last week.  patient's main compliant remains getting her medications on time and not so late in the evening when she is already asleep and having to be woken up.    Patient reports she is glad to start seeing food and trying to eat. Reports minimal pain, and she has not had any Ativan when she feels anxious.  She is more emotional she reports but she is sleeping alright.  Reports feelings of bordeom and frustration for being in the hospital for so long with no definitive timeframe of changes.    Reports she has been coloring and that has helped pass the time, but has not been writing in journal and felt ashamed of not following throught on tasks left for her. Support given to patient and expressed no anger or disappointment from CSW, that the information and journal left were for patient if she felt like utiliizing tools.  Patient appeared relieved and verbalized she is glad CSW is not mad at her for not completing tasks.    Patient and CSW continued to have open dialouge. Patient was confronted how she was feeling and asked to express any fears or concerns. Patient fidgets with her tubes but does not  repsond.  Patient is guarded throughout entire assessment, but will answer questions when asked, but she is very short and will look down when answering.  Sitter remains for safety.   Participation Level:  Active  Participation Quality  Guarded   Other Participation Quality:   Patient asked when probed with questions, but when in silence searches for thoughts and answers.  She smiles occassionally and gives limited information unless probed with multiple questions.  Will continue to engage patient and attempt to get her back outside on 9/4 for brief therapy   Affect  Flat  Depressed   Cognitive  Appropriate  Oriented   Reaction to Medications/Concerns:   Patient upset and verbalizes that she does not like getting her medications so late in the evening (10:30 or 11:00 at night) Will address with unit.  Patient also reports she stopped getting her trazodone at night for sleep and will address both with MD and Psych MD.  Reports Risperdal is helping and continuing to be used.   Modes of Intervention  Behaviors/Psychosis  Support  Exploration   Summary of Progress/Plan at Discharge   1. Will address medications with MD's per patient request with regards to trazodone for sleep and lateness of medication.  2. Remains medically unstable for dc to psych hospital/home due to impulsivity and suicide attempt.  3. Sitter remains with patient for safety.  4. Counseling continues for patient on  a daily/weekly basis Utilizing DBT  5. Will continue to follow.   Ashley Jacobs, MSW LCSW [phone]
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
## 2012-08-29 NOTE — Progress Notes (Signed)
Nutrition Follow-up  Intervention:   1.  Meals/snacks; pt previously educated on Low Fat diet.  RD placed lunch meal per pt preference- discussed fat content.  Assessment:   Pt sitting in chair at time of visit.  Discussed diet advancement with pt.  Breakfast tray at bedside, untouched as pt was not aware her diet had been advanced.  Requested muffin off tray and stated preference for lunch- tuna salad. Pt reports tolerance of full liquids without pain or nausea.  Patient is receiving TPN with Clinimix E 5/20 @ 80 ml/hr.  Lipids (20% IVFE @ 10 ml/hr), multivitamins, and trace elements are provided 3 times weekly (MWF) due to national backorder.  Provides 1895 kcal and 96 grams protein daily (based on weekly average).  Meets 100% minimum estimated kcal and 100% minimum estimated protein needs.  Diet Order:  Low Fat  Meds: Scheduled Meds:    . buPROPion  150 mg Oral Q breakfast  . insulin aspart  0-9 Units Subcutaneous Q8H  . naproxen  500 mg Oral BID WC  . octreotide  200 mcg Subcutaneous Q8H  . risperiDONE  3 mg Oral QHS  . Warfarin - Pharmacist Dosing Inpatient   Does not apply q1800  . DISCONTD: antiseptic oral rinse  15 mL Mouth Rinse q12n4p  . DISCONTD: chlorhexidine  15 mL Mouth Rinse BID   Continuous Infusions:    . sodium chloride 20 mL/hr at 08/26/12 0501  . fat emulsion 240 mL (08/28/12 1722)  . TPN (CLINIMIX) +/- additives 80 mL/hr at 08/27/12 1720  . TPN (CLINIMIX) +/- additives 80 mL/hr at 08/28/12 1723   PRN Meds:.acetaminophen, diphenhydrAMINE, diphenhydrAMINE, HYDROmorphone (DILAUDID) injection, hydrOXYzine, LORazepam, ondansetron (ZOFRAN) IV, oxyCODONE, sodium chloride, traZODone  Labs:  CMP     Component Value Date/Time   NA 140 08/28/2012 0530   K 4.2 08/28/2012 0530   CL 105 08/28/2012 0530   CO2 28 08/28/2012 0530   GLUCOSE 120* 08/28/2012 0530   BUN 21 08/28/2012 0530   CREATININE 0.91 08/28/2012 0530   CALCIUM 8.8 08/28/2012 0530   PROT 6.1 08/28/2012 0530   ALBUMIN  2.8* 08/28/2012 0530   AST 22 08/28/2012 0530   ALT 24 08/28/2012 0530   ALKPHOS 203* 08/28/2012 0530   BILITOT 0.3 08/28/2012 0530   GFRNONAA 77* 08/28/2012 0530   GFRAA 90* 08/28/2012 0530     Intake/Output Summary (Last 24 hours) at 08/29/12 1054 Last data filed at 08/29/12 0600  Gross per 24 hour  Intake 3712.76 ml  Output   2230 ml  Net 1482.76 ml    Weight Status:  188 lbs, stable now.  Re-estimated needs:  1900-2100 kcal, 90-100g protein  Nutrition Dx:  Inadequate oral intake, ongoing  Monitor:   1. Food/Beverage; diet advancement once appropriate per MD. Met, diet advanced to Fat Modified 2. Wt/wt change; monitor trends. Met, pt with questionable wt loss.  3. Parenteral nutrition; continued tolerance with pt meeting >90% estimated needs. Met, ongoing. Pt meeting 100% of needs with PN and has advanced diet.  4.  Knowledge; for questions related to Fat Modified diet.  Met, continue.    Loyce Dys, MS RD LDN Clinical Inpatient Dietitian Pager: [phone] Weekend/After hours pager: [phone]
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
## 2012-08-29 NOTE — Progress Notes (Signed)
Up doing her hair with her mother.  Pancreatic bed drain output is down.  Try low fat diet. Patient examined and I agree with the assessment and plan  [name], MD, MPH, FACS Pager: [phone]  08/29/2012 2:36 PM

## 2012-08-30 DIAGNOSIS — E46 Unspecified protein-calorie malnutrition: Secondary | ICD-10-CM

## 2012-08-30 LAB — PROTIME-INR: Prothrombin Time: 20.5 s — ABNORMAL HIGH (ref 11.6–15.2)

## 2012-08-30 LAB — BASIC METABOLIC PANEL
BUN: 23 mg/dL (ref 6–23)
Calcium: 8.9 mg/dL (ref 8.4–10.5)
Creatinine, Ser: 0.98 mg/dL (ref 0.50–1.10)
GFR calc Af Amer: 82 mL/min — ABNORMAL LOW (ref >90)
GFR calc non Af Amer: 71 mL/min — ABNORMAL LOW (ref >90)
Glucose, Bld: 136 mg/dL — ABNORMAL HIGH (ref 70–99)
Potassium: 4.4 meq/L (ref 3.5–5.1)

## 2012-08-30 LAB — GLUCOSE, CAPILLARY: Glucose-Capillary: 97 mg/dL (ref 70–99)

## 2012-08-30 MED ORDER — WARFARIN SODIUM 4 MG PO TABS
4.0000 mg | ORAL_TABLET | Freq: Once | ORAL | Status: AC
  Administered 2012-08-30: 4 mg via ORAL
  Filled 2012-08-30 (×2): qty 1
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
## 2012-08-30 NOTE — Progress Notes (Signed)
UR completed 
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
## 2012-08-30 NOTE — Progress Notes (Signed)
She looks great!  This patient has been seen and I agree with the findings and treatment plan.  Marta Lamas. Gae Bon, MD, FACS [phone] (pager) [phone] (direct pager) Trauma Surgeon

## 2012-08-30 NOTE — Consult Note (Signed)
Progress note following Consultation Pt is seen while Stoma dressing is changed.  Pt reports that two other drains came out and has only 3 more to come out.  She has a bright affect. She says visits with her family and her pet dog over the holiday weekend was especially nice. She is using her coloring diagrams and colored pencils that helps her pass the time.   DIAGNOSIS:  AXIS I  R/O Depression with psychotic features, Hx Bipolar I D/O   AXIS II  Borderline Personality Disorder  AXIS III  See medical notes.   AXIS IV  economic problems, educational problems, housing problems, other psychosocial or environmental problems, problems related to social environment and Recent changes in psychiatric care   AXIS V  41-50 serious symptoms Now 51-60    Assessment and Plan:  Tauri has an improved mood, no frowns are observed and even while dressings or change she is smiling. This is a great shift in her mental outlook. She feels that she can see the progression of healing take place. She no longer has or refers to auditory hallucinations. There is no longer concerned that psychotic symptoms persist.  RECOMMENDATION:  1. Patient is progressing well without psychotic symptoms. 2. Patient is monitored as medical conditions improved. 3. Patient is cognitively intact and may return to home and family when medically stable 4. No further psychiatric need identified unless requested. M.D. Psychiatrist signs off Davante Gerke J. [name], MD Psychiatrist  08/30/2012 2:40 PM

## 2012-08-31 LAB — PROTIME-INR
INR: 2.58 — ABNORMAL HIGH (ref 0.00–1.49)
Prothrombin Time: 28.1 s — ABNORMAL HIGH (ref 11.6–15.2)

## 2012-08-31 MED ORDER — HYDROMORPHONE HCL PF 1 MG/ML IJ SOLN: 0.5000 mg | INTRAMUSCULAR | Status: DC | PRN

## 2012-08-31 MED ORDER — OXYCODONE HCL 5 MG PO TABS
5.0000 mg | ORAL_TABLET | ORAL | Status: DC | PRN
  Administered 2012-08-31 – 2012-09-01 (×4): 10 mg via ORAL
  Administered 2012-09-01: 15 mg via ORAL
  Filled 2012-08-31: qty 3
  Filled 2012-08-31: qty 2
  Filled 2012-08-31: qty 1
  Filled 2012-08-31 (×3): qty 2

## 2012-08-31 MED ORDER — WARFARIN SODIUM 2.5 MG PO TABS
2.5000 mg | ORAL_TABLET | Freq: Once | ORAL | Status: AC
  Administered 2012-08-31: 2.5 mg via ORAL
  Filled 2012-08-31: qty 1

## 2012-08-31 MED ORDER — ONDANSETRON HCL 4 MG PO TABS: 4.0000 mg | ORAL_TABLET | ORAL | Status: DC | PRN

## 2012-08-31 MED ORDER — BACITRACIN ZINC 500 UNIT/GM EX OINT
TOPICAL_OINTMENT | Freq: Two times a day (BID) | CUTANEOUS | Status: DC
  Administered 2012-08-31 – 2012-09-01 (×3): via TOPICAL
  Filled 2012-08-31: qty 15

## 2012-08-31 NOTE — Progress Notes (Signed)
Pt reviewed in hospital LOS meeting today.  

## 2012-08-31 NOTE — Progress Notes (Signed)
Drain #1 coming out today, up ambulating well, sorting out psych issues Patient examined and I agree with the assessment and plan  [name], MD, MPH, FACS Pager: [phone]  08/31/2012 11:24 AM

## 2012-08-31 NOTE — Progress Notes (Signed)
31 Days Post-Op  Subjective: LLQ drains #3 and #4 placed 8/20 Pt really doing well Home today or tomorrow with drains  Objective: Vital signs in last 24 hours: Temp:  [98.1 F (36.7 C)-98.5 F (36.9 C)] 98.1 F (36.7 C) (09/05 0557) Pulse Rate:  [80-86] 80  (09/05 0557) Resp:  [16-17] 17  (09/05 0557) BP: (94-102)/(54-55) 94/55 mmHg (09/05 0557) SpO2:  [96 %-99 %] 96 % (09/05 0557) Last BM Date: 08/29/12  Intake/Output from previous day: 09/04 0701 - 09/05 0700 In: 660 [P.O.:660] Out: 1476 [Urine:1350; Drains:126] Intake/Output this shift:   PE:  Output still significant from 3 and 4 #3 - 35 cc yesterday #4 - 75 cc yesterday Both milky Sites clean and dry #4 tender   Lab Results:  No results found for this basename: WBC:2,HGB:2,HCT:2,PLT:2 in the last 72 hours BMET  Basename 08/30/12 0500  NA 140  K 4.4  CL 104  CO2 27  GLUCOSE 136*  BUN 23  CREATININE 0.98  CALCIUM 8.9   PT/INR  Basename 08/31/12 0910 08/30/12 0500  LABPROT 28.1* 20.5*  INR 2.58* 1.72*   ABG No results found for this basename: PHART:2,PCO2:2,PO2:2,HCO3:2 in the last 72 hours  Studies/Results: No results found.  Anti-infectives: Anti-infectives     Start     Dose/Rate Route Frequency Ordered Stop   08/15/12 1830   ertapenem (INVANZ) 1 g in sodium chloride 0.9 % 50 mL IVPB  Status:  Discontinued        1 g 100 mL/hr over 30 Minutes Intravenous Every 24 hours 08/15/12 1652 08/22/12 0829   08/15/12 1730   fluconazole (DIFLUCAN) IVPB 200 mg  Status:  Discontinued        200 mg 100 mL/hr over 60 Minutes Intravenous Every 24 hours 08/15/12 1652 08/25/12 1031          Assessment/Plan: s/p Procedure(s) (LRB) with comments: EXPLORATORY LAPAROTOMY (N/A) - REPAIR OF PANCREATIC INJURY, EXPLORATION OF RETROPERITONEUM. UPPER GI ENDOSCOPY (N/A) - Diagnostic esophagogastroscopy TRANSVERSE COLON RESECTION (N/A) COLOSTOMY (Right)  LLQ drains #3 and #4 placed in IR 8/20 Better Output  for both still significant Home soon with drains Will need re CT when output had diminished to 10 cc/day  Eniya Cannady A 08/31/2012
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
## 2012-08-31 NOTE — Progress Notes (Signed)
Progress Note.  Pt seen this am   Pt has another drain removed.  Discussion focus on discharge to home  Discussed with Dr. Jacky Kindle and Psych CSW [who reports [name] PA plans to discharge pt.] Major concern is how pt will manage home, alone, for the majority of the day.  Her daughter will be in high school.  Her mother works two days a week and when she is off, pt will be spending time with her.  Pt has improved mood over time.  Stress and Auditory hallucinations are resolved.  She denies SI and says she lok forward to being alone without so many [staff] hovering over her.   RECOMMENDATION: 1.  Plan to text husband at work, while home alone every 30 min, a simple message to indicate she is 'OK'.  [name] agrees and will discuss with husband at noon today.  2.  Suggest discontinue sitter.  3.  Agree with plan to discharge to home.  [name] [name] J. [name], MD Psychiatrist  08/31/2012 12:01 PM

## 2012-08-31 NOTE — Progress Notes (Addendum)
Discussed patient in Length of Stay meeting as well with Trauma MD and Dr. Jacky Kindle and Dr. [name] with regards to psych progress note.  Per note and per clarification from Psych MD and Dr. Jacky Kindle, patient can dc home today with medicals intentions to dc home.  Sitter can be discontinued per Psych and [employer]. Patient to follow up in the outpatient and CSW will work to arrange the quickest available date.  Patient asking for CSW and will follow up ASAP per RN and call.  Anticipate dc today or tomorrow per medical.  Have called husband but not been able to reach will continue to work to reach.  Husband aware of DC and agreeable. Reports that patient mother will stay with her over the weekend since her husband and son will be out of town. Next week patient will stay at her home as patient husband will be home.  Patient very excited and elated she will get to go home.  Patient reports she most excited to sleep with the family dog and be in her bed.  Patient aware to call [facility] on Monday to schedule appointment with new MD and counselor. Patient husband to pick patient up when MD clears and DC.  Will update trauma team.  No other needs. Signing off.  Ashley Jacobs, MSW LCSW [phone]
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
## 2012-09-01 LAB — PROTIME-INR
INR: 3.8 — ABNORMAL HIGH (ref 0.00–1.49)
Prothrombin Time: 38 s — ABNORMAL HIGH (ref 11.6–15.2)

## 2012-09-01 LAB — CULTURE, ROUTINE-ABSCESS: Culture: NO GROWTH

## 2012-09-01 MED ORDER — WARFARIN SODIUM 2.5 MG PO TABS: 2.5000 mg | ORAL_TABLET | Freq: Every day | ORAL | Status: DC

## 2012-09-01 MED ORDER — RISPERIDONE 3 MG PO TABS: 3.0000 mg | ORAL_TABLET | Freq: Every day | ORAL | Status: DC

## 2012-09-01 MED ORDER — NAPROXEN 500 MG PO TABS: 500.0000 mg | ORAL_TABLET | Freq: Two times a day (BID) | ORAL | Status: DC

## 2012-09-01 MED ORDER — BUPROPION HCL ER (XL) 150 MG PO TB24: 150.0000 mg | ORAL_TABLET | Freq: Every day | ORAL | Status: DC

## 2012-09-01 MED ORDER — OXYCODONE-ACETAMINOPHEN 7.5-325 MG PO TABS: 1.0000 | ORAL_TABLET | ORAL | Status: AC | PRN

## 2012-09-01 MED ORDER — TRAZODONE HCL 50 MG PO TABS: 50.0000 mg | ORAL_TABLET | Freq: Every evening | ORAL | Status: DC | PRN

## 2012-09-01 NOTE — Progress Notes (Signed)
Patient anticipated DC today and she is very excited per verbal report and behaviors. Discussed with patient importance of follow up and crisis mobile. Patient has appointment scheduled on Sept 18th with new Psych MD and counselor. Patient husband to pick patient up and transport to home. Gave resources and safety contracted with patient. Patient is very happy and talkative.  No other needs identified. CM working on [facility].  If needs arise please re-consult or call.  Ashley Jacobs, MSW LCSW [phone]

## 2012-09-01 NOTE — Progress Notes (Signed)
Agree with discharge.  Will stop Octreotide.

## 2012-09-01 NOTE — Progress Notes (Signed)
Spoke with pt about HHRN coming to assist with drain mgmt. Pt is open to this and feels she can easily learn the care but appreciates having the support once home.  Confirmed that the address and phone we have in Epic are correct.  The phone listed is the pt's cell phone.  Pt chose [facility] for [facility] agency when given choice within her network. Arrangements made.

## 2012-09-01 NOTE — Discharge Summary (Signed)
Agree with discharge summary.

## 2012-09-01 NOTE — Progress Notes (Signed)
Patient discharged to home in care of spouse and mother. Medications and instructions reviewed with patient and spouse and questions answered. No IV access. Assessment unchanged from this am. Patient is to follow up with [facility] on 9.19.13 and will call to make follow up appointment with PCP. Patient will have HHRN to come to house tomorrow to assist with dressing changes and ostomy care as needed.
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
## 2012-09-04 ENCOUNTER — Telehealth (HOSPITAL_COMMUNITY): Payer: Self-pay | Admitting: Orthopedic Surgery

## 2012-09-04 ENCOUNTER — Encounter (HOSPITAL_COMMUNITY): Payer: Self-pay | Admitting: Emergency Medicine

## 2012-09-04 NOTE — Telephone Encounter (Signed)
Rescheduled appt for following week as she couldn't find a ride.

## 2012-09-05 DIAGNOSIS — Z933 Colostomy status: Secondary | ICD-10-CM | POA: Diagnosis not present

## 2012-09-05 DIAGNOSIS — F329 Major depressive disorder, single episode, unspecified: Secondary | ICD-10-CM | POA: Diagnosis not present

## 2012-09-05 DIAGNOSIS — S36201A Unspecified injury of body of pancreas, initial encounter: Secondary | ICD-10-CM | POA: Diagnosis not present

## 2012-09-05 DIAGNOSIS — T81718A Complication of other artery following a procedure, not elsewhere classified, initial encounter: Secondary | ICD-10-CM | POA: Diagnosis not present

## 2012-09-05 DIAGNOSIS — T148XXA Other injury of unspecified body region, initial encounter: Secondary | ICD-10-CM | POA: Diagnosis not present

## 2012-09-05 DIAGNOSIS — I2699 Other pulmonary embolism without acute cor pulmonale: Secondary | ICD-10-CM | POA: Diagnosis not present

## 2012-09-07 ENCOUNTER — Telehealth (HOSPITAL_COMMUNITY): Payer: Self-pay | Admitting: Orthopedic Surgery

## 2012-09-08 ENCOUNTER — Emergency Department (HOSPITAL_COMMUNITY): Payer: 59

## 2012-09-08 ENCOUNTER — Encounter (HOSPITAL_COMMUNITY): Payer: Self-pay | Admitting: Physical Medicine and Rehabilitation

## 2012-09-08 ENCOUNTER — Emergency Department (HOSPITAL_COMMUNITY)
Admission: EM | Admit: 2012-09-08 | Discharge: 2012-09-09 | Disposition: A | Discharge status: Home / Self Care | Payer: 59 | Attending: Emergency Medicine | Admitting: Emergency Medicine

## 2012-09-08 ENCOUNTER — Telehealth (HOSPITAL_COMMUNITY): Payer: Self-pay | Admitting: Orthopedic Surgery

## 2012-09-08 ENCOUNTER — Telehealth: Payer: Self-pay | Admitting: Orthopedic Surgery

## 2012-09-08 DIAGNOSIS — Z9889 Other specified postprocedural states: Secondary | ICD-10-CM | POA: Diagnosis not present

## 2012-09-08 DIAGNOSIS — R10819 Abdominal tenderness, unspecified site: Secondary | ICD-10-CM | POA: Insufficient documentation

## 2012-09-08 DIAGNOSIS — Z933 Colostomy status: Secondary | ICD-10-CM | POA: Insufficient documentation

## 2012-09-08 DIAGNOSIS — R109 Unspecified abdominal pain: Secondary | ICD-10-CM | POA: Insufficient documentation

## 2012-09-08 DIAGNOSIS — Z87828 Personal history of other (healed) physical injury and trauma: Secondary | ICD-10-CM | POA: Diagnosis not present

## 2012-09-08 DIAGNOSIS — N390 Urinary tract infection, site not specified: Secondary | ICD-10-CM

## 2012-09-08 LAB — COMPREHENSIVE METABOLIC PANEL
ALT: 19 U/L (ref 0–35)
AST: 19 U/L (ref 0–37)
Alkaline Phosphatase: 310 U/L — ABNORMAL HIGH (ref 39–117)
Calcium: 9.3 mg/dL (ref 8.4–10.5)
GFR calc Af Amer: 71 mL/min — ABNORMAL LOW (ref >90)
Glucose, Bld: 130 mg/dL — ABNORMAL HIGH (ref 70–99)
Potassium: 4.5 mEq/L (ref 3.5–5.1)
Sodium: 134 mEq/L — ABNORMAL LOW (ref 135–145)
Total Protein: 7.2 g/dL (ref 6.0–8.3)

## 2012-09-08 LAB — CBC WITH DIFFERENTIAL/PLATELET
Basophils Absolute: 0 10*3/uL (ref 0.0–0.1)
Eosinophils Absolute: 0.1 10*3/uL (ref 0.0–0.7)
Eosinophils Relative: 0 % (ref 0–5)
Lymphocytes Relative: 6 % — ABNORMAL LOW (ref 12–46)
Lymphs Abs: 1.2 10*3/uL (ref 0.7–4.0)
MCH: 25.7 pg — ABNORMAL LOW (ref 26.0–34.0)
MCV: 81.2 fL (ref 78.0–100.0)
Neutrophils Relative %: 84 % — ABNORMAL HIGH (ref 43–77)
Platelets: 483 10*3/uL — ABNORMAL HIGH (ref 150–400)
RBC: 3.62 MIL/uL — ABNORMAL LOW (ref 3.87–5.11)
RDW: 15.6 % — ABNORMAL HIGH (ref 11.5–15.5)
WBC: 18.7 10*3/uL — ABNORMAL HIGH (ref 4.0–10.5)

## 2012-09-08 LAB — URINE MICROSCOPIC-ADD ON

## 2012-09-08 LAB — URINALYSIS, ROUTINE W REFLEX MICROSCOPIC
Glucose, UA: NEGATIVE mg/dL
Specific Gravity, Urine: 1.035 — ABNORMAL HIGH (ref 1.005–1.030)
Urobilinogen, UA: 1 mg/dL (ref 0.0–1.0)
pH: 5.5 (ref 5.0–8.0)

## 2012-09-08 MED ORDER — CEPHALEXIN 500 MG PO CAPS: 500.0000 mg | ORAL_CAPSULE | Freq: Four times a day (QID) | ORAL | Status: DC

## 2012-09-08 MED ORDER — IOHEXOL 300 MG/ML  SOLN
20.0000 mL | INTRAMUSCULAR | Status: AC
  Administered 2012-09-08 (×2): 20 mL via ORAL

## 2012-09-08 MED ORDER — HYDROMORPHONE HCL PF 1 MG/ML IJ SOLN
1.0000 mg | Freq: Once | INTRAMUSCULAR | Status: AC
  Administered 2012-09-08: 1 mg via INTRAVENOUS
  Filled 2012-09-08: qty 1

## 2012-09-08 MED ORDER — ONDANSETRON HCL 4 MG/2ML IJ SOLN
4.0000 mg | Freq: Once | INTRAMUSCULAR | Status: AC
  Administered 2012-09-08: 4 mg via INTRAVENOUS
  Filled 2012-09-08: qty 2

## 2012-09-08 MED ORDER — SODIUM CHLORIDE 0.9 % IV BOLUS (SEPSIS)
1000.0000 mL | Freq: Once | INTRAVENOUS | Status: AC
  Administered 2012-09-08: 1000 mL via INTRAVENOUS

## 2012-09-08 MED ORDER — SODIUM CHLORIDE 0.9 % IV SOLN
INTRAVENOUS | Status: DC
  Administered 2012-09-08: 21:00:00 via INTRAVENOUS

## 2012-09-08 MED ORDER — DEXTROSE 5 % IV SOLN
1.0000 g | INTRAVENOUS | Status: DC
  Administered 2012-09-08: 1 g via INTRAVENOUS
  Filled 2012-09-08: qty 10

## 2012-09-08 MED ORDER — IOHEXOL 300 MG/ML  SOLN
100.0000 mL | Freq: Once | INTRAMUSCULAR | Status: AC | PRN
  Administered 2012-09-08: 100 mL via INTRAVENOUS

## 2012-09-08 NOTE — ED Notes (Signed)
Pt return from ct scan states feeling much better. Denies complaints at this time.

## 2012-09-08 NOTE — ED Notes (Signed)
Pt vomiting contrast. Medication given

## 2012-09-08 NOTE — Telephone Encounter (Signed)
Wrote rx for ostomy supplies. Pt to pick up in trauma office.

## 2012-09-08 NOTE — ED Notes (Signed)
Pt presents to department for evaluation of diffuse abdominal pain and decreased urinary output. Ongoing for several days. Pt states pain became worse today, 9/10 upon arrival. She is conscious alert and oriented x4. Tachycardic at the time. Pt has (2) abdominal drains from previous surgery.

## 2012-09-08 NOTE — Telephone Encounter (Signed)
[name] [name] wanted to know how long after taking Perc could she drive, I told her 6 hours. She asked about rx for ostomy supplies. I said I was glad to write them but it might be easier for her to get them from her PCP's office rather than trying to navigate the hospital. She said she would give her office a call.

## 2012-09-08 NOTE — Telephone Encounter (Signed)
Left message that her insurance company was sending me an order form for her ostomy supplies and she didn't need to come pick up rx.
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
## 2012-09-11 ENCOUNTER — Telehealth (INDEPENDENT_AMBULATORY_CARE_PROVIDER_SITE_OTHER): Payer: Self-pay
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
## 2012-09-13 DIAGNOSIS — F333 Major depressive disorder, recurrent, severe with psychotic symptoms: Secondary | ICD-10-CM | POA: Diagnosis not present

## 2012-09-18 ENCOUNTER — Encounter (HOSPITAL_COMMUNITY): Payer: Self-pay | Admitting: Family Medicine

## 2012-09-18 ENCOUNTER — Emergency Department (HOSPITAL_COMMUNITY)
Admission: EM | Admit: 2012-09-18 | Discharge: 2012-09-19 | Disposition: A | Discharge status: Home / Self Care | Payer: 59 | Attending: Emergency Medicine | Admitting: Emergency Medicine

## 2012-09-18 ENCOUNTER — Emergency Department (HOSPITAL_COMMUNITY): Payer: 59

## 2012-09-18 DIAGNOSIS — R000 Tachycardia, unspecified: Secondary | ICD-10-CM | POA: Diagnosis not present

## 2012-09-18 DIAGNOSIS — Z87828 Personal history of other (healed) physical injury and trauma: Secondary | ICD-10-CM | POA: Diagnosis not present

## 2012-09-18 DIAGNOSIS — K224 Dyskinesia of esophagus: Secondary | ICD-10-CM | POA: Diagnosis not present

## 2012-09-18 DIAGNOSIS — Z933 Colostomy status: Secondary | ICD-10-CM | POA: Diagnosis not present

## 2012-09-18 DIAGNOSIS — R0602 Shortness of breath: Secondary | ICD-10-CM | POA: Diagnosis not present

## 2012-09-18 DIAGNOSIS — R0789 Other chest pain: Secondary | ICD-10-CM

## 2012-09-18 DIAGNOSIS — R109 Unspecified abdominal pain: Secondary | ICD-10-CM | POA: Diagnosis not present

## 2012-09-18 LAB — COMPREHENSIVE METABOLIC PANEL
AST: 17 U/L (ref 0–37)
Albumin: 3 g/dL — ABNORMAL LOW (ref 3.5–5.2)
Alkaline Phosphatase: 189 U/L — ABNORMAL HIGH (ref 39–117)
BUN: 11 mg/dL (ref 6–23)
Creatinine, Ser: 0.91 mg/dL (ref 0.50–1.10)
Potassium: 4.1 mEq/L (ref 3.5–5.1)
Total Protein: 7.1 g/dL (ref 6.0–8.3)

## 2012-09-18 LAB — CBC WITH DIFFERENTIAL/PLATELET
Basophils Absolute: 0 10*3/uL (ref 0.0–0.1)
Basophils Relative: 0 % (ref 0–1)
Eosinophils Absolute: 0.1 10*3/uL (ref 0.0–0.7)
Hemoglobin: 8.9 g/dL — ABNORMAL LOW (ref 12.0–15.0)
MCH: 25.1 pg — ABNORMAL LOW (ref 26.0–34.0)
MCHC: 31 g/dL (ref 30.0–36.0)
Monocytes Relative: 9 % (ref 3–12)
Neutro Abs: 9.1 10*3/uL — ABNORMAL HIGH (ref 1.7–7.7)
Neutrophils Relative %: 80 % — ABNORMAL HIGH (ref 43–77)
RDW: 15.8 % — ABNORMAL HIGH (ref 11.5–15.5)

## 2012-09-18 LAB — PROTIME-INR
INR: 1.28 (ref 0.00–1.49)
Prothrombin Time: 15.7 seconds — ABNORMAL HIGH (ref 11.6–15.2)

## 2012-09-18 LAB — LIPASE, BLOOD: Lipase: 153 U/L — ABNORMAL HIGH (ref 11–59)

## 2012-09-18 LAB — PRO B NATRIURETIC PEPTIDE: Pro B Natriuretic peptide (BNP): 80.5 pg/mL (ref 0–125)

## 2012-09-18 MED ORDER — SODIUM CHLORIDE 0.9 % IV BOLUS (SEPSIS)
1000.0000 mL | Freq: Once | INTRAVENOUS | Status: AC
  Administered 2012-09-18: 1000 mL via INTRAVENOUS

## 2012-09-18 MED ORDER — IOHEXOL 350 MG/ML SOLN
80.0000 mL | Freq: Once | INTRAVENOUS | Status: AC | PRN
  Administered 2012-09-18: 80 mL via INTRAVENOUS

## 2012-09-18 MED ORDER — GI COCKTAIL ~~LOC~~
30.0000 mL | Freq: Once | ORAL | Status: AC
  Administered 2012-09-18: 30 mL via ORAL
  Filled 2012-09-18: qty 30

## 2012-09-18 MED ORDER — MORPHINE SULFATE 4 MG/ML IJ SOLN
4.0000 mg | Freq: Once | INTRAMUSCULAR | Status: AC
  Administered 2012-09-18: 4 mg via INTRAVENOUS
  Filled 2012-09-18: qty 1
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
## 2012-09-18 NOTE — ED Notes (Signed)
Patient transported to X-ray 

## 2012-09-18 NOTE — ED Notes (Signed)
Pt complaining of epigastric pain that started after lunch today and work about 4 pm. sts she feels like her food and drink gets stuck.

## 2012-09-18 NOTE — ED Notes (Signed)
MD at bedside. [name] [name]

## 2012-09-19 ENCOUNTER — Telehealth (HOSPITAL_COMMUNITY): Payer: Self-pay | Admitting: Orthopedic Surgery

## 2012-09-19 DIAGNOSIS — R1110 Vomiting, unspecified: Secondary | ICD-10-CM | POA: Diagnosis not present

## 2012-09-19 DIAGNOSIS — K219 Gastro-esophageal reflux disease without esophagitis: Secondary | ICD-10-CM | POA: Diagnosis not present

## 2012-09-19 DIAGNOSIS — R079 Chest pain, unspecified: Secondary | ICD-10-CM | POA: Diagnosis not present

## 2012-09-19 DIAGNOSIS — Z23 Encounter for immunization: Secondary | ICD-10-CM | POA: Diagnosis not present

## 2012-09-19 MED ORDER — HYDROCODONE-ACETAMINOPHEN 7.5-325 MG PO TABS: 1.0000 | ORAL_TABLET | ORAL | Status: DC | PRN

## 2012-09-19 NOTE — Telephone Encounter (Signed)
Patient called in for refill on pain medication. Called in Norco 7.5, #30.

## 2012-09-20 LAB — POCT I-STAT TROPONIN I
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
## 2012-09-21 ENCOUNTER — Other Ambulatory Visit (INDEPENDENT_AMBULATORY_CARE_PROVIDER_SITE_OTHER): Payer: Self-pay | Admitting: General Surgery

## 2012-09-21 ENCOUNTER — Ambulatory Visit (INDEPENDENT_AMBULATORY_CARE_PROVIDER_SITE_OTHER): Payer: 59 | Admitting: Internal Medicine

## 2012-09-21 ENCOUNTER — Ambulatory Visit
Admission: RE | Admit: 2012-09-21 | Discharge: 2012-09-21 | Disposition: A | Discharge status: Home / Self Care | Payer: 59 | Source: Ambulatory Visit | Attending: General Surgery | Admitting: General Surgery

## 2012-09-21 ENCOUNTER — Encounter (INDEPENDENT_AMBULATORY_CARE_PROVIDER_SITE_OTHER): Payer: Self-pay

## 2012-09-21 VITALS — BP 118/76 | HR 76 | Temp 97.1°F | Resp 16 | Ht 63.0 in | Wt 174.4 lb

## 2012-09-21 DIAGNOSIS — Y249XXA Unspecified firearm discharge, undetermined intent, initial encounter: Secondary | ICD-10-CM

## 2012-09-21 DIAGNOSIS — R109 Unspecified abdominal pain: Secondary | ICD-10-CM

## 2012-09-21 DIAGNOSIS — T148XXA Other injury of unspecified body region, initial encounter: Secondary | ICD-10-CM

## 2012-09-21 DIAGNOSIS — R112 Nausea with vomiting, unspecified: Secondary | ICD-10-CM

## 2012-09-21 MED ORDER — OXYCODONE HCL 5 MG PO TABS: 5.0000 mg | ORAL_TABLET | Freq: Four times a day (QID) | ORAL | Status: DC | PRN

## 2012-09-21 MED ORDER — IOHEXOL 300 MG/ML  SOLN
100.0000 mL | Freq: Once | INTRAMUSCULAR | Status: AC | PRN
  Administered 2012-09-21: 100 mL via INTRAVENOUS

## 2012-09-21 NOTE — Patient Instructions (Signed)
We will call you with your CT results.  Continue drain care.
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
## 2012-09-22 ENCOUNTER — Other Ambulatory Visit (INDEPENDENT_AMBULATORY_CARE_PROVIDER_SITE_OTHER): Payer: Self-pay | Admitting: Internal Medicine

## 2012-09-22 ENCOUNTER — Telehealth (HOSPITAL_COMMUNITY): Payer: Self-pay | Admitting: Orthopedic Surgery

## 2012-09-22 DIAGNOSIS — S31139A Puncture wound of abdominal wall without foreign body, unspecified quadrant without penetration into peritoneal cavity, initial encounter: Secondary | ICD-10-CM

## 2012-09-22 DIAGNOSIS — R1110 Vomiting, unspecified: Secondary | ICD-10-CM | POA: Diagnosis not present

## 2012-09-22 NOTE — Telephone Encounter (Signed)
Extended orders for [facility] to see pt.

## 2012-09-22 NOTE — Telephone Encounter (Signed)
[name] [name] contacted patient and she is going to follow up with IR.

## 2012-09-25 ENCOUNTER — Ambulatory Visit (HOSPITAL_COMMUNITY)
Admission: RE | Admit: 2012-09-25 | Discharge: 2012-09-25 | Disposition: A | Discharge status: Home / Self Care | Payer: 59 | Source: Ambulatory Visit | Attending: Internal Medicine | Admitting: Internal Medicine

## 2012-09-25 ENCOUNTER — Other Ambulatory Visit (INDEPENDENT_AMBULATORY_CARE_PROVIDER_SITE_OTHER): Payer: Self-pay | Admitting: Internal Medicine

## 2012-09-25 DIAGNOSIS — Z4682 Encounter for fitting and adjustment of non-vascular catheter: Secondary | ICD-10-CM | POA: Insufficient documentation

## 2012-09-25 DIAGNOSIS — S31139A Puncture wound of abdominal wall without foreign body, unspecified quadrant without penetration into peritoneal cavity, initial encounter: Secondary | ICD-10-CM

## 2012-09-25 DIAGNOSIS — W3400XA Accidental discharge from unspecified firearms or gun, initial encounter: Secondary | ICD-10-CM

## 2012-09-25 MED ORDER — IOHEXOL 300 MG/ML  SOLN
50.0000 mL | Freq: Once | INTRAMUSCULAR | Status: AC | PRN
Start: 2012-09-25 — End: 2012-09-25
  Administered 2012-09-25: 5 mL

## 2012-09-25 NOTE — Procedures (Signed)
Procedure:  Removal of left abdominal drain and replacment of second drain Findings:  Upper left drain removed.  More inferior LLQ drain replaced with larger, 12 Fr drain over wire.  Hooked to new suction bulb.

## 2012-09-26 ENCOUNTER — Telehealth (HOSPITAL_COMMUNITY): Payer: Self-pay | Admitting: *Deleted

## 2012-09-26 DIAGNOSIS — D500 Iron deficiency anemia secondary to blood loss (chronic): Secondary | ICD-10-CM | POA: Diagnosis not present

## 2012-09-26 DIAGNOSIS — R1110 Vomiting, unspecified: Secondary | ICD-10-CM | POA: Diagnosis not present

## 2012-09-26 DIAGNOSIS — E860 Dehydration: Secondary | ICD-10-CM | POA: Diagnosis not present

## 2012-09-26 DIAGNOSIS — F329 Major depressive disorder, single episode, unspecified: Secondary | ICD-10-CM | POA: Diagnosis not present

## 2012-09-26 NOTE — Telephone Encounter (Signed)
Radiology post procedure phone call.  Pt doing well, no problems or questions. 

## 2012-09-27 DIAGNOSIS — R1110 Vomiting, unspecified: Secondary | ICD-10-CM | POA: Diagnosis not present

## 2012-10-02 ENCOUNTER — Other Ambulatory Visit (INDEPENDENT_AMBULATORY_CARE_PROVIDER_SITE_OTHER): Payer: Self-pay | Admitting: General Surgery

## 2012-10-02 ENCOUNTER — Telehealth (HOSPITAL_COMMUNITY): Payer: Self-pay | Admitting: Orthopedic Surgery

## 2012-10-02 DIAGNOSIS — L0291 Cutaneous abscess, unspecified: Secondary | ICD-10-CM

## 2012-10-02 NOTE — Telephone Encounter (Signed)
Spoke with [name] [name] re: her remaining drain that isn't putting out much anymore. He will get her into clinic to assess. I told her to call me back if she had further questions.

## 2012-10-04 ENCOUNTER — Inpatient Hospital Stay (HOSPITAL_COMMUNITY): Admission: RE | Admit: 2012-10-04 | Payer: Self-pay | Source: Ambulatory Visit

## 2012-10-05 ENCOUNTER — Ambulatory Visit (HOSPITAL_COMMUNITY)
Admission: RE | Admit: 2012-10-05 | Discharge: 2012-10-05 | Disposition: A | Discharge status: Home / Self Care | Payer: 59 | Source: Ambulatory Visit | Attending: General Surgery | Admitting: General Surgery

## 2012-10-05 DIAGNOSIS — K651 Peritoneal abscess: Secondary | ICD-10-CM | POA: Insufficient documentation

## 2012-10-05 DIAGNOSIS — J90 Pleural effusion, not elsewhere classified: Secondary | ICD-10-CM | POA: Insufficient documentation

## 2012-10-05 DIAGNOSIS — L0291 Cutaneous abscess, unspecified: Secondary | ICD-10-CM

## 2012-10-05 NOTE — Progress Notes (Signed)
Patient seen today after CT scan.  JP in LLQ without suction and milky white drainage.  When I removed anterior abdominal gauze - green tinged purulent drainage flowed out from prior drainage sites.  [facility] called and they evaluated patient as well.  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 No foul odor noted. Area cultured and sent to lab.  Areas cleansed and new dressing applied.  Surgery to f/u with patient once culture results finalized.  Patient encouraged to change bandages as necessary to keep skin from breaking down.

## 2012-10-06 ENCOUNTER — Telehealth: Payer: Self-pay | Admitting: Orthopedic Surgery

## 2012-10-06 MED ORDER — TRAMADOL HCL 50 MG PO TABS: 50.0000 mg | ORAL_TABLET | Freq: Four times a day (QID) | ORAL | Status: DC | PRN

## 2012-10-06 NOTE — Telephone Encounter (Signed)
[name] [name] called in for more pain medication. Although she initially requested oxycodone when I said I couldn't call that in she suggested tramadol. Rx written for tramadol.

## 2012-10-08 ENCOUNTER — Other Ambulatory Visit (INDEPENDENT_AMBULATORY_CARE_PROVIDER_SITE_OTHER): Payer: Self-pay | Admitting: General Surgery

## 2012-10-08 ENCOUNTER — Telehealth (INDEPENDENT_AMBULATORY_CARE_PROVIDER_SITE_OTHER): Payer: Self-pay | Admitting: General Surgery

## 2012-10-08 MED ORDER — TRAMADOL HCL 50 MG PO TABS: 50.0000 mg | ORAL_TABLET | Freq: Four times a day (QID) | ORAL | Status: DC | PRN

## 2012-10-08 NOTE — Telephone Encounter (Signed)
Called stating that her pharmacy never received the Tramadol she was supposed to get on 10/11.  I spoke with her pharmacy and confirmed that.  I reordered the Tramadol and the pharmacy received it.

## 2012-10-09 ENCOUNTER — Telehealth (HOSPITAL_COMMUNITY): Payer: Self-pay | Admitting: Orthopedic Surgery

## 2012-10-09 LAB — CULTURE, ROUTINE-ABSCESS

## 2012-10-09 MED ORDER — DOXYCYCLINE HYCLATE 50 MG PO CAPS: 100.0000 mg | ORAL_CAPSULE | Freq: Two times a day (BID) | ORAL | Status: DC

## 2012-10-09 NOTE — Telephone Encounter (Signed)
[name] [name]'s culture came back with OSSA. Plan for doxy 100mg  bid x 3 weeks. She is to call if nothing seems better by Friday.

## 2012-10-10 ENCOUNTER — Telehealth (HOSPITAL_COMMUNITY): Payer: Self-pay | Admitting: Orthopedic Surgery

## 2012-10-11 ENCOUNTER — Telehealth (INDEPENDENT_AMBULATORY_CARE_PROVIDER_SITE_OTHER): Payer: Self-pay

## 2012-10-11 NOTE — Telephone Encounter (Signed)
Patient called to report that she's not able to keep anything down, she's concerned because she cannot keep liquids, food or her antibiotics down.  Please advise

## 2012-10-12 ENCOUNTER — Other Ambulatory Visit (INDEPENDENT_AMBULATORY_CARE_PROVIDER_SITE_OTHER): Payer: Self-pay | Admitting: Orthopedic Surgery

## 2012-10-12 ENCOUNTER — Ambulatory Visit (HOSPITAL_COMMUNITY)
Admission: RE | Admit: 2012-10-12 | Discharge: 2012-10-12 | Disposition: A | Discharge status: Home / Self Care | Payer: 59 | Source: Ambulatory Visit | Attending: Orthopedic Surgery | Admitting: Orthopedic Surgery

## 2012-10-12 ENCOUNTER — Telehealth (HOSPITAL_COMMUNITY): Payer: Self-pay | Admitting: Orthopedic Surgery

## 2012-10-12 ENCOUNTER — Other Ambulatory Visit (INDEPENDENT_AMBULATORY_CARE_PROVIDER_SITE_OTHER): Payer: Self-pay | Admitting: General Surgery

## 2012-10-12 DIAGNOSIS — S36209A Unspecified injury of unspecified part of pancreas, initial encounter: Secondary | ICD-10-CM

## 2012-10-12 DIAGNOSIS — A928 Other specified mosquito-borne viral fevers: Secondary | ICD-10-CM

## 2012-10-12 DIAGNOSIS — B999 Unspecified infectious disease: Secondary | ICD-10-CM | POA: Insufficient documentation

## 2012-10-12 LAB — AMYLASE, BODY FLUID: Amylase, Fluid: 9 U/L

## 2012-10-12 NOTE — Telephone Encounter (Signed)
Addressed in another encounter

## 2012-10-12 NOTE — Telephone Encounter (Signed)
[name] [name] called to say she was having trouble keeping anything down and the phenergan wasn't helping. We will have PICC placed and give fluids, abx, and antiemetics IV until she improves. D/w Dr. Lindie Spruce who wanted fluid sampled for amylase, will order. Ordered cleocin 600mg  IV q6h x14d, NS 1038ml/day x5 days, and phenergan 12.5mg  IV q6h prn nausea.

## 2012-10-18 DIAGNOSIS — R112 Nausea with vomiting, unspecified: Secondary | ICD-10-CM | POA: Diagnosis not present

## 2012-10-18 DIAGNOSIS — IMO0002 Reserved for concepts with insufficient information to code with codable children: Secondary | ICD-10-CM | POA: Diagnosis not present

## 2012-10-18 DIAGNOSIS — R1310 Dysphagia, unspecified: Secondary | ICD-10-CM | POA: Diagnosis not present

## 2012-10-19 ENCOUNTER — Telehealth (HOSPITAL_COMMUNITY): Payer: Self-pay | Admitting: Orthopedic Surgery

## 2012-10-20 NOTE — Telephone Encounter (Signed)
[facility] drew a CBC on [name] [name] which showed a Hb of 7.9. I d/w Dr. Janee Morn and decided to repeat the study in approx 10 days.

## 2012-10-26 ENCOUNTER — Telehealth (HOSPITAL_COMMUNITY): Payer: Self-pay | Admitting: Orthopedic Surgery

## 2012-10-26 DIAGNOSIS — R1310 Dysphagia, unspecified: Secondary | ICD-10-CM | POA: Diagnosis not present

## 2012-10-26 DIAGNOSIS — R079 Chest pain, unspecified: Secondary | ICD-10-CM | POA: Diagnosis not present

## 2012-10-27 ENCOUNTER — Other Ambulatory Visit (HOSPITAL_COMMUNITY): Payer: Self-pay | Admitting: Radiology

## 2012-10-27 ENCOUNTER — Other Ambulatory Visit (INDEPENDENT_AMBULATORY_CARE_PROVIDER_SITE_OTHER): Payer: Self-pay | Admitting: Orthopedic Surgery

## 2012-10-27 DIAGNOSIS — Z4803 Encounter for change or removal of drains: Secondary | ICD-10-CM

## 2012-10-27 NOTE — Telephone Encounter (Signed)
Spoke with [facility]. Nayana is due to finish abx tomorrow. I spoke with Beckey [name] who will get the patient in on Monday to repeat CT and see if drain is ready to come out. RN will leave PICC in for now and will go ahead and draw labs Monday as scheduled.

## 2012-10-30 ENCOUNTER — Ambulatory Visit (HOSPITAL_COMMUNITY)
Admission: RE | Admit: 2012-10-30 | Discharge: 2012-10-30 | Disposition: A | Discharge status: Home / Self Care | Payer: 59 | Source: Ambulatory Visit | Attending: Radiology | Admitting: Radiology

## 2012-10-30 DIAGNOSIS — Z4889 Encounter for other specified surgical aftercare: Secondary | ICD-10-CM | POA: Insufficient documentation

## 2012-10-30 DIAGNOSIS — Z4803 Encounter for change or removal of drains: Secondary | ICD-10-CM

## 2012-10-30 MED ORDER — IOHEXOL 300 MG/ML  SOLN
80.0000 mL | Freq: Once | INTRAMUSCULAR | Status: AC | PRN
  Administered 2012-10-30: 80 mL via INTRAVENOUS

## 2012-10-31 ENCOUNTER — Telehealth (INDEPENDENT_AMBULATORY_CARE_PROVIDER_SITE_OTHER): Payer: Self-pay | Admitting: Orthopedic Surgery

## 2012-10-31 MED ORDER — PROMETHAZINE HCL 12.5 MG PO TABS: 12.5000 mg | ORAL_TABLET | Freq: Four times a day (QID) | ORAL | Status: DC | PRN

## 2012-10-31 NOTE — Telephone Encounter (Signed)
I left a VM with Britta Mccreedy from [facility] stating our intention to continue abx on Maeva for 2 more weeks. I spoke with Leotis Shames and told her the plan. She will call in 2 weeks so that we can schedule another CT scan. She asked for more phenergan. I suggested we try some PO since her nausea was better. She was amenable to this.

## 2012-11-06 IMAGING — CT CT ANGIO CHEST
2 of 6 series · 19 of 46 positions shown · IV contrast (APPLIED)
Comparison: No chest CT comparison is available.

CLINICAL DATA: Chest pain.  Short of breath.  History of pulmonary
embolus.

CT ANGIOGRAPHY CHEST
TECHNIQUE: Multidetector CT imaging of the chest using the
standard protocol during bolus administration of intravenous
contrast. Multiplanar reconstructed images including MIPs were
obtained and reviewed to evaluate the vascular anatomy.
Contrast: 80mL OMNIPAQUE IOHEXOL 350 MG/ML SOLN

[Series 6: pulm embolism 1.0 b25f thin · axial · 0.60mm/px · z∈[-96,+140]mm · 16 of 260 slices shown]
[im 12/260  lung]
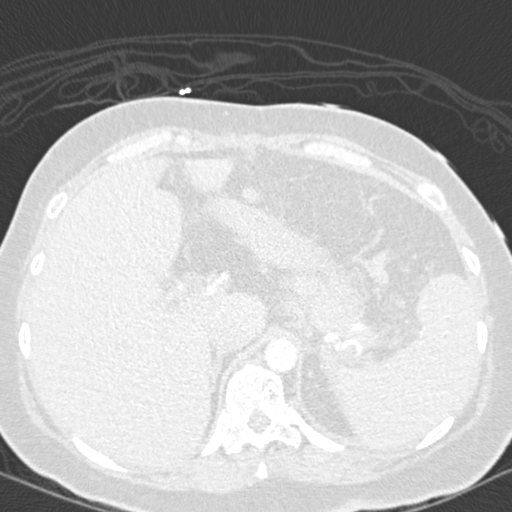
[im 34/260  soft-tissue]
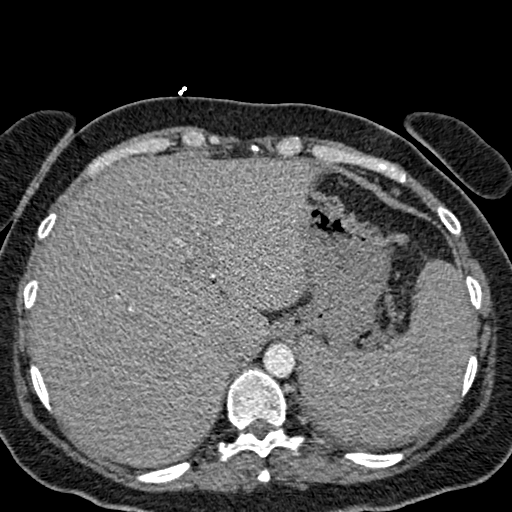
[im 46/260  lung]
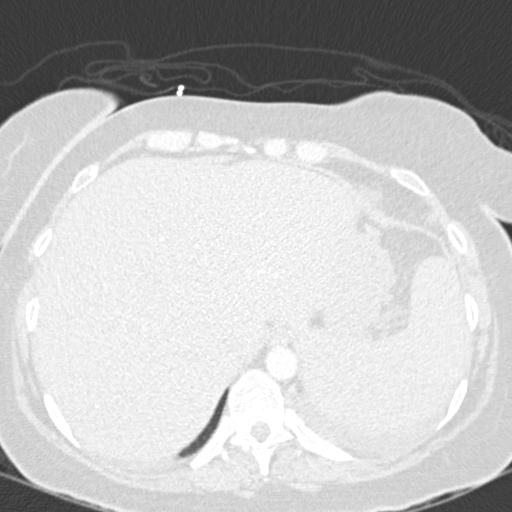
[im 57/260  soft-tissue]
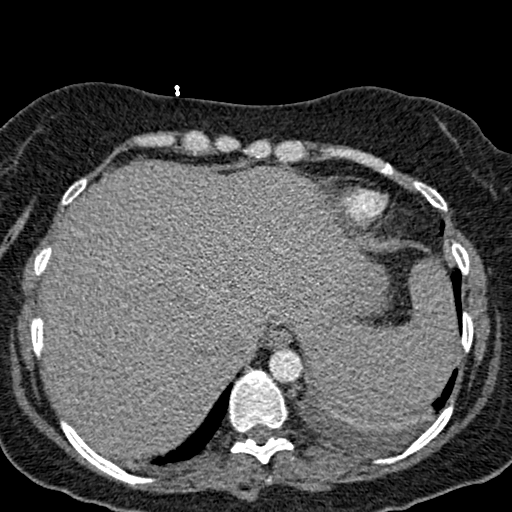
[im 79/260  lung]
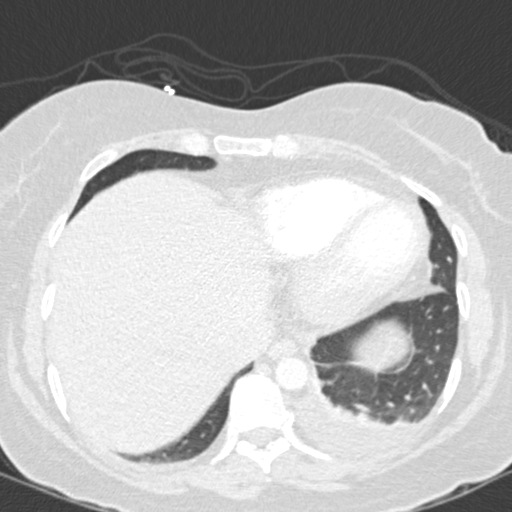
[im 91/260  soft-tissue]
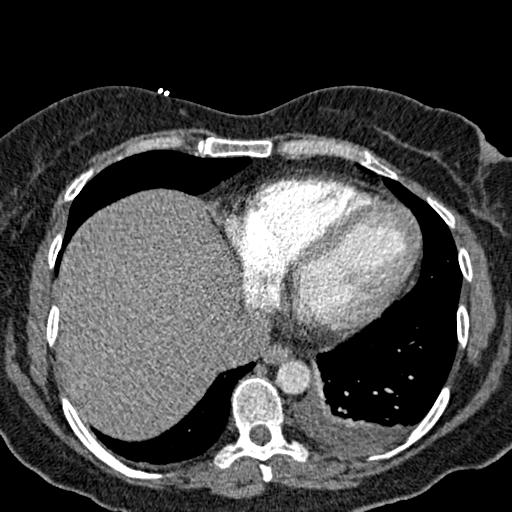
[im 102/260  lung]
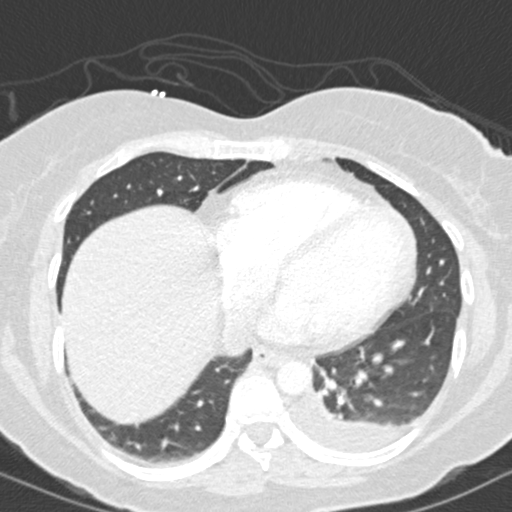
[im 124/260  soft-tissue]
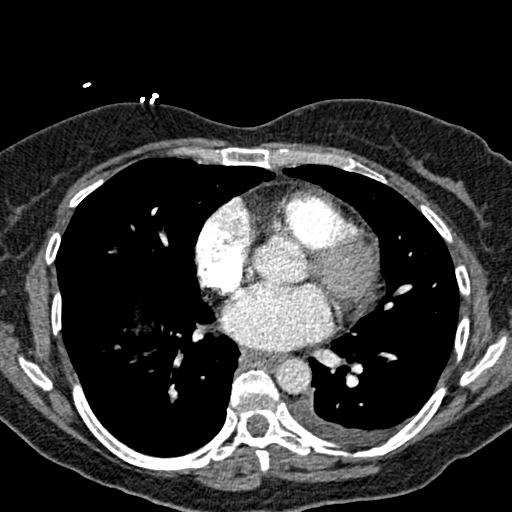
[im 136/260  lung]
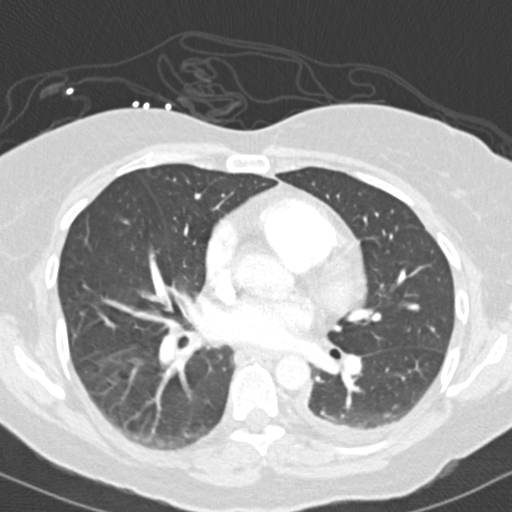
[im 158/260  soft-tissue]
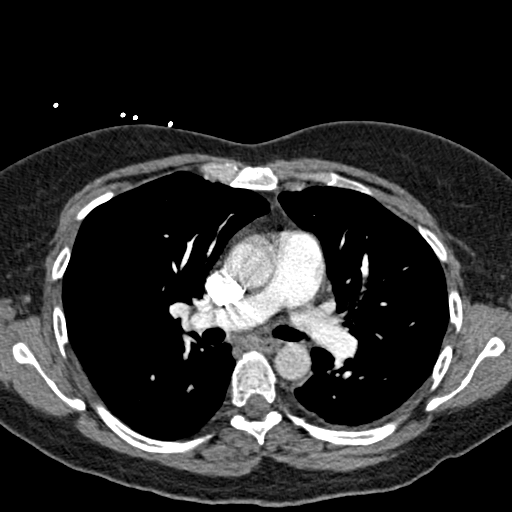
[im 169/260  lung]
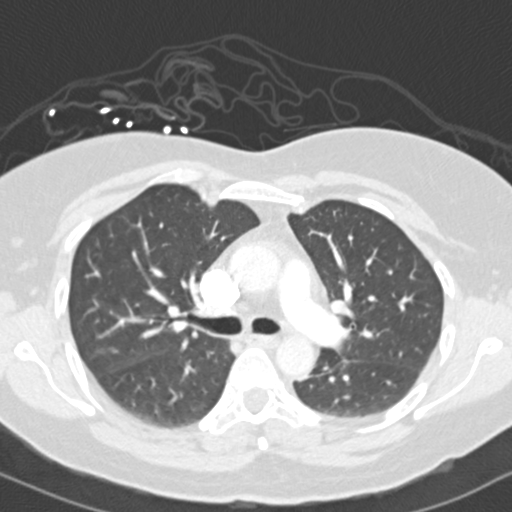
[im 181/260  soft-tissue]
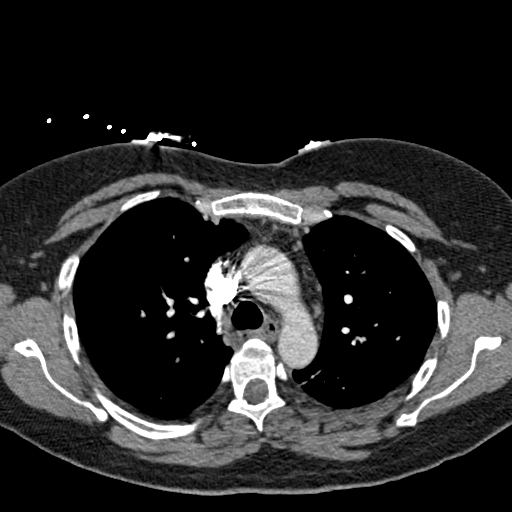
[im 203/260  lung]
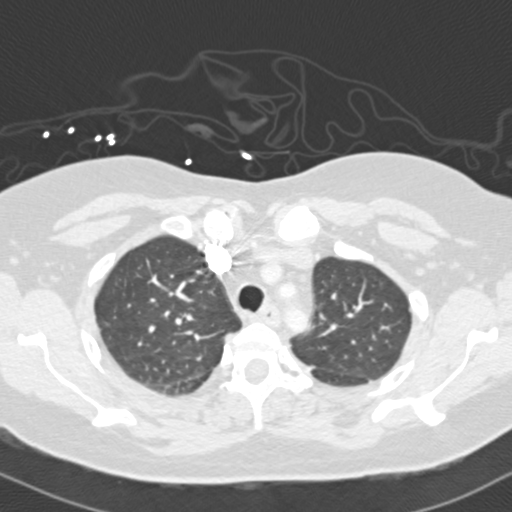
[im 214/260  soft-tissue]
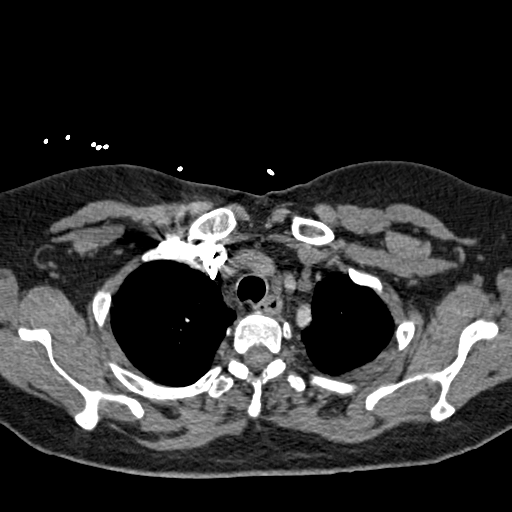
[im 226/260  lung]
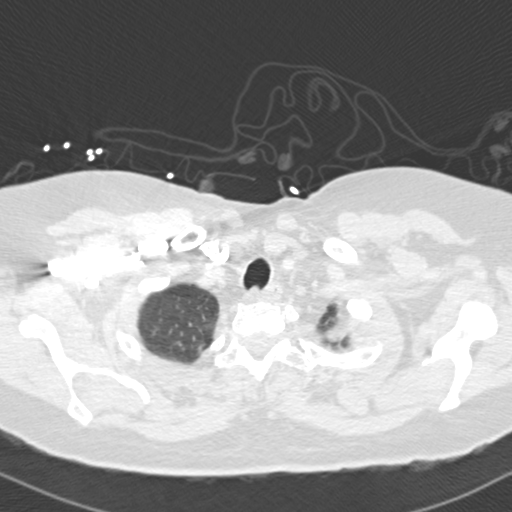
[im 248/260  soft-tissue]
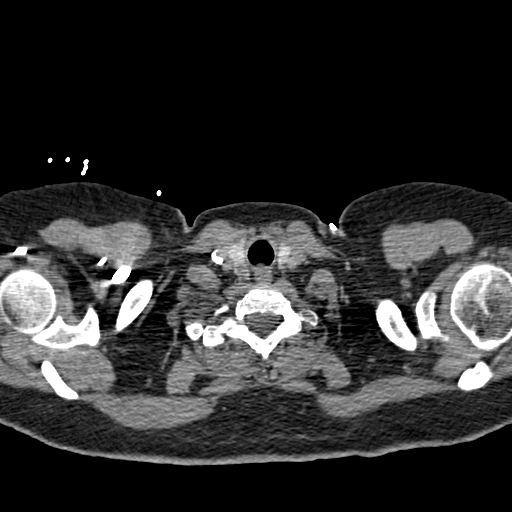

[Series 602: cor · coronal · 0.60mm/px · 3 of 111 slices shown]
[im 28/111  soft-tissue]
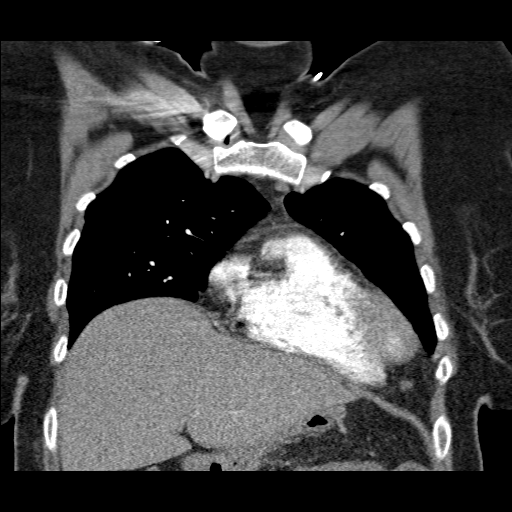
[im 56/111  soft-tissue]
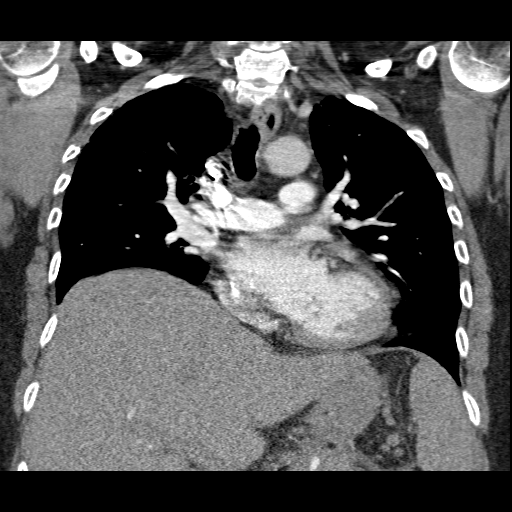
[im 83/111  soft-tissue]
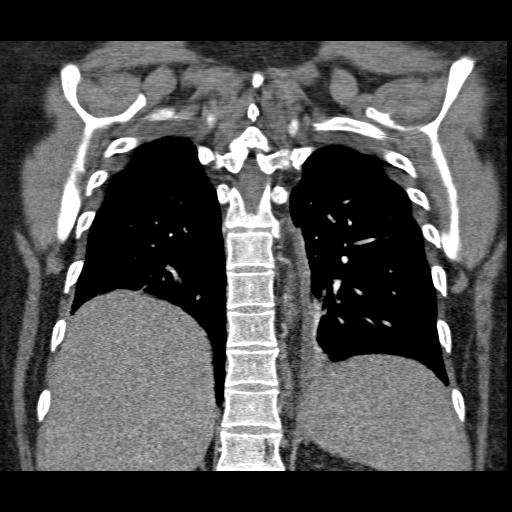

[19 of 46 positions shown; findings below may reference images not displayed]

FINDINGS: Small left pleural effusion layers dependently.  Heart
appears within normal limits.  There is no pulmonary embolus.
Artifact is present producing suboptimal evaluation of the lower
lobe pulmonary arteries.  No segmental or larger pulmonary embolus
is identified.  There is no adenopathy.  The no pericardial
effusion.  Three-vessel aortic arch.  Incidental imaging of the
upper abdomen is within normal limits aside from presumed
postsurgical changes in the sub xyphoid anterior abdominal wall and
stranding in the upper abdominal mesentery. There is no airspace
disease in the lungs.  Dependent atelectasis is present. No
aggressive osseous lesions.
IMPRESSION: 1.  Negative for pulmonary embolus.
2.  Presumed postoperative changes in the upper abdominal wall and
mesentery.
3.  Small dependently layering left pleural effusion.

## 2012-11-13 ENCOUNTER — Telehealth (HOSPITAL_COMMUNITY): Payer: Self-pay | Admitting: Orthopedic Surgery

## 2012-11-14 ENCOUNTER — Other Ambulatory Visit (INDEPENDENT_AMBULATORY_CARE_PROVIDER_SITE_OTHER): Payer: Self-pay | Admitting: Surgery

## 2012-11-14 ENCOUNTER — Telehealth (INDEPENDENT_AMBULATORY_CARE_PROVIDER_SITE_OTHER): Payer: Self-pay | Admitting: General Surgery

## 2012-11-14 ENCOUNTER — Telehealth (HOSPITAL_COMMUNITY): Payer: Self-pay | Admitting: Orthopedic Surgery

## 2012-11-14 DIAGNOSIS — L02211 Cutaneous abscess of abdominal wall: Secondary | ICD-10-CM

## 2012-11-14 NOTE — Telephone Encounter (Signed)
Contacted pt to let her know of CT ordered for Friday, 11/17/12, at [facility] to follow up on LUQ abscess.  She understands to go by the radiology dept to pick up the contrast.

## 2012-11-14 NOTE — Telephone Encounter (Signed)
Addressed in other note.

## 2012-11-14 NOTE — Telephone Encounter (Signed)
[name] [name] called to say that her drain was putting out only 5-36ml/day of gray fluid and her drain sites were not draining at all. Her N/V has also resolved and she is feeling much better. She will finish her abx today. We will repeat her CT scan and make decisions based on that.

## 2012-11-17 ENCOUNTER — Other Ambulatory Visit (HOSPITAL_COMMUNITY): Payer: Self-pay

## 2012-11-17 ENCOUNTER — Ambulatory Visit (HOSPITAL_COMMUNITY)
Admission: RE | Admit: 2012-11-17 | Discharge: 2012-11-17 | Disposition: A | Discharge status: Home / Self Care | Payer: 59 | Source: Ambulatory Visit | Attending: Surgery | Admitting: Surgery

## 2012-11-17 DIAGNOSIS — L02211 Cutaneous abscess of abdominal wall: Secondary | ICD-10-CM

## 2012-11-17 DIAGNOSIS — L02219 Cutaneous abscess of trunk, unspecified: Secondary | ICD-10-CM | POA: Insufficient documentation

## 2012-11-17 MED ORDER — IOHEXOL 300 MG/ML  SOLN
80.0000 mL | Freq: Once | INTRAMUSCULAR | Status: AC | PRN
  Administered 2012-11-17: 80 mL via INTRAVENOUS

## 2012-11-17 NOTE — Progress Notes (Signed)
Pt here for repeat CT today. Images reviewed by Dr. Miles Costain and LLQ  fluid collection resolved. He has discussed with trauma/CCS and they request removal of drain and PICC  Rt UE PICC removed completely intact at 36cm without difficulty. Dressing applied  LLQ drain removed without difficulty, dressing applied.  Pt tolerated procedures well.  Allayne Butcher 11/17/2012 10:37 AM

## 2012-11-27 ENCOUNTER — Other Ambulatory Visit: Payer: Self-pay | Admitting: Family Medicine

## 2012-11-27 ENCOUNTER — Ambulatory Visit
Admission: RE | Admit: 2012-11-27 | Discharge: 2012-11-27 | Disposition: A | Discharge status: Home / Self Care | Payer: 59 | Source: Ambulatory Visit | Attending: Family Medicine | Admitting: Family Medicine

## 2012-11-27 DIAGNOSIS — S99929A Unspecified injury of unspecified foot, initial encounter: Secondary | ICD-10-CM

## 2012-11-27 DIAGNOSIS — S8990XA Unspecified injury of unspecified lower leg, initial encounter: Secondary | ICD-10-CM | POA: Diagnosis not present

## 2012-11-29 ENCOUNTER — Ambulatory Visit (INDEPENDENT_AMBULATORY_CARE_PROVIDER_SITE_OTHER): Payer: 59 | Admitting: Psychiatry

## 2012-11-29 DIAGNOSIS — F3160 Bipolar disorder, current episode mixed, unspecified: Secondary | ICD-10-CM | POA: Diagnosis not present

## 2012-11-29 MED ORDER — TRAZODONE HCL 100 MG PO TABS: 100.0000 mg | ORAL_TABLET | Freq: Every day | ORAL | Status: DC

## 2012-11-29 MED ORDER — RISPERIDONE 3 MG PO TBDP: 3.0000 mg | ORAL_TABLET | Freq: Every evening | ORAL | Status: DC | PRN

## 2012-11-29 NOTE — Progress Notes (Signed)
Patient ID: Irving Burton, female   DOB: Feb 13, 1971, 41 y.o.   MRN: [id number] This afternoon contact was made with [facility] [phone]) am your employee [place]. Mrs. Vernona Rieger Wissing's history was presented to her and the need for a higher level of care that is theACT program. The patient's number was taken by Massie Bougie and she shared with me that they will be calling her in the next few days.she will attempt to get her into an assessment which might take a few weeks. The worker was told that the patient has enough medicine to last for approximately 2 months and does have her return appointment to see [place] in 6 weeks. Nonetheless Belinda imply that she just the patient that they care for a higher level of service theACT program
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
## 2012-11-30 DIAGNOSIS — M25579 Pain in unspecified ankle and joints of unspecified foot: Secondary | ICD-10-CM | POA: Diagnosis not present

## 2012-12-13 ENCOUNTER — Encounter (INDEPENDENT_AMBULATORY_CARE_PROVIDER_SITE_OTHER): Payer: Self-pay

## 2012-12-14 DIAGNOSIS — M25579 Pain in unspecified ankle and joints of unspecified foot: Secondary | ICD-10-CM | POA: Diagnosis not present

## 2012-12-18 ENCOUNTER — Encounter (INDEPENDENT_AMBULATORY_CARE_PROVIDER_SITE_OTHER): Payer: Self-pay

## 2013-01-05 DIAGNOSIS — F329 Major depressive disorder, single episode, unspecified: Secondary | ICD-10-CM | POA: Diagnosis not present

## 2013-01-05 DIAGNOSIS — Z79899 Other long term (current) drug therapy: Secondary | ICD-10-CM | POA: Diagnosis not present

## 2013-01-05 DIAGNOSIS — T81718A Complication of other artery following a procedure, not elsewhere classified, initial encounter: Secondary | ICD-10-CM | POA: Diagnosis not present

## 2013-01-05 DIAGNOSIS — I2699 Other pulmonary embolism without acute cor pulmonale: Secondary | ICD-10-CM | POA: Diagnosis not present

## 2013-01-10 ENCOUNTER — Ambulatory Visit (INDEPENDENT_AMBULATORY_CARE_PROVIDER_SITE_OTHER): Payer: 59 | Admitting: Psychiatry

## 2013-01-10 DIAGNOSIS — F3160 Bipolar disorder, current episode mixed, unspecified: Secondary | ICD-10-CM

## 2013-01-10 MED ORDER — TRAZODONE HCL 100 MG PO TABS: 100.0000 mg | ORAL_TABLET | Freq: Every day | ORAL | Status: DC

## 2013-01-10 MED ORDER — RISPERIDONE 3 MG PO TBDP: 3.0000 mg | ORAL_TABLET | Freq: Every evening | ORAL | Status: DC | PRN

## 2013-01-10 NOTE — Progress Notes (Signed)
[facility] MD Progress Note  01/10/2013 2:58 PM [name] [name]  MRN:  [id number] Subjective:  well Diagnosis:  Axis I: Bipolar, mixed Today the patient is seen alone. On our initial visit it was made clear to her that she'll be receiving a call from a program called the [facility]. I spoke to this entity and they assured me they be calling her shortly. In fact had not called. Today I called back to the facility/agency and they say they have no evidence that I ever called. They did say that they'll now sent a form to our office and we'll fill out as a referral. To recovery this patient has a chronic severe mental illness. Remarkably she is actually doing quite well. She is sleeping and eating well is not depressed and not anxious. She shows no evidence of psychosis. She's not suicidal. 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 Her family is actually very stable. She is to teenagers who are doing well a husband who is healthy and works. The patient is enjoying life and actually quite stable. ADL's:  Intact  Sleep: Good  Appetite:  Good  Suicidal Ideation:  none Homicidal Ideation:  none AEB (as evidenced by):  Psychiatric Specialty Exam: ROS  There were no vitals taken for this visit.There is no height or weight on file to calculate BMI.  General Appearance: Casual  Eye Contact::  Good  Speech:  Clear and Coherent  Volume:  Normal  Mood:  Euthymic  Affect:  Congruent  Thought Process:  Coherent  Orientation:  Full (Time, Place, and Person)  Thought Content:  WDL  Suicidal Thoughts:  No  Homicidal Thoughts:  No  Memory:  NA  Judgement:  Good  Insight:  Good  Psychomotor Activity:  Normal  Concentration:  Good  Recall:  Good  Akathisia:  No  Handed:  Right  AIMS (if indicated):     Assets:  Desire for Improvement  Sleep:      Current Medications: Current Outpatient Prescriptions  Medication Sig Dispense Refill  . acetaminophen (TYLENOL) 500 MG  tablet Take 1,000 mg by mouth every 6 (six) hours as needed. For pain      . buPROPion (WELLBUTRIN XL) 150 MG 24 hr tablet Take 1 tablet (150 mg total) by mouth daily with breakfast.  30 tablet  0  . HYDROcodone-acetaminophen (NORCO) 7.5-325 MG per tablet Take 1-2 tablets by mouth every 4 (four) hours as needed for pain.  30 tablet  0  . ondansetron (ZOFRAN) 4 MG tablet Take 4 mg by mouth every 8 (eight) hours as needed. For vomiting      . oxyCODONE (ROXICODONE) 5 MG immediate release tablet Take 1-2 tablets (5-10 mg total) by mouth every 6 (six) hours as needed for pain.  60 tablet  0  . promethazine (PHENERGAN) 12.5 MG tablet Take 1 tablet (12.5 mg total) by mouth every 6 (six) hours as needed for nausea.  30 tablet  0  . risperidone (RISPERDAL M-TABS) 3 MG disintegrating tablet Take 1 tablet (3 mg total) by mouth at bedtime as needed.  30 tablet  2  . risperiDONE (RISPERDAL) 3 MG tablet Take 1 tablet (3 mg total) by mouth at bedtime.      . rivaroxaban (XARELTO) 10 MG TABS tablet Take 20 mg by mouth daily.      . traMADol (ULTRAM) 50 MG tablet Take 1-2 tablets (50-100 mg total) by mouth every 6 (six) hours as needed  for pain.  50 tablet  1  . traZODone (DESYREL) 100 MG tablet Take 1 tablet (100 mg total) by mouth at bedtime.  60 tablet  2  . traZODone (DESYREL) 50 MG tablet Take 1 tablet (50 mg total) by mouth at bedtime as needed for sleep.  30 tablet  0    Lab Results: No results found for this or any previous visit (from the past 48 hour(s)).  Physical Findings: AIMS:  , ,  ,  ,    CIWA:    COWS:     Treatment Plan Summary:   Plan:    despite the fact that on my 2 visits with this patient she is doing quite well I will continue with our plan to refer her to a higher level of care. This patient has a chronic severe mental illness, has been in a psychiatric hospital over a dozen times and this failed traditional stand alone psychiatry care. I believe this patient needs an act team. Today  I may contact again with the [phone] and spoke to an intake person who told her that she would fax a referral form to [place]. Nonetheless at this time I will continue this patient's medications and give her current appointment to see me back in 2 and half months. The patient was told that in fact the program called her and has an intake with her she need not return to this setting and will call us and tell us that her care is being delivered. Nonetheless I will: Her medicines Risperdal 3 mg at night and trazodone 100 mg 2 at night.Today we'll go ahead and fill out a referral form to psychotherapeutic services.  Medical Decision Making Problem Points:  Established problem, worsening (2) Data Points:  Review or order medicine tests (1)  I certify that inpatient services furnished can reasonably be expected to improve the patient's condition.   [name] [name] [name] 01/10/2013, 2:58 PM

## 2013-01-15 ENCOUNTER — Encounter (INDEPENDENT_AMBULATORY_CARE_PROVIDER_SITE_OTHER): Payer: Self-pay

## 2013-01-30 ENCOUNTER — Encounter (INDEPENDENT_AMBULATORY_CARE_PROVIDER_SITE_OTHER): Payer: Self-pay | Admitting: General Surgery

## 2013-01-30 ENCOUNTER — Ambulatory Visit (INDEPENDENT_AMBULATORY_CARE_PROVIDER_SITE_OTHER): Payer: 59 | Admitting: General Surgery

## 2013-01-30 VITALS — BP 110/64 | HR 68 | Temp 97.2°F | Resp 12 | Ht 64.0 in | Wt 171.0 lb

## 2013-01-30 DIAGNOSIS — Z433 Encounter for attention to colostomy: Secondary | ICD-10-CM | POA: Diagnosis not present

## 2013-01-30 DIAGNOSIS — Z933 Colostomy status: Secondary | ICD-10-CM | POA: Insufficient documentation
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
## 2013-01-31 ENCOUNTER — Encounter (INDEPENDENT_AMBULATORY_CARE_PROVIDER_SITE_OTHER): Payer: Self-pay

## 2013-02-01 ENCOUNTER — Encounter (HOSPITAL_COMMUNITY): Payer: Self-pay | Admitting: Pharmacist

## 2013-02-05 ENCOUNTER — Other Ambulatory Visit (INDEPENDENT_AMBULATORY_CARE_PROVIDER_SITE_OTHER): Payer: Self-pay | Admitting: General Surgery

## 2013-02-05 ENCOUNTER — Ambulatory Visit
Admission: RE | Admit: 2013-02-05 | Discharge: 2013-02-05 | Disposition: A | Discharge status: Home / Self Care | Payer: 59 | Source: Ambulatory Visit | Attending: General Surgery | Admitting: General Surgery

## 2013-02-05 DIAGNOSIS — Z433 Encounter for attention to colostomy: Secondary | ICD-10-CM

## 2013-02-08 ENCOUNTER — Encounter (HOSPITAL_COMMUNITY)
Admission: RE | Admit: 2013-02-08 | Discharge: 2013-02-08 | Disposition: A | Discharge status: Home / Self Care | Payer: 59 | Source: Ambulatory Visit | Attending: General Surgery | Admitting: General Surgery

## 2013-02-08 ENCOUNTER — Encounter (HOSPITAL_COMMUNITY): Payer: Self-pay

## 2013-02-08 DIAGNOSIS — D62 Acute posthemorrhagic anemia: Secondary | ICD-10-CM | POA: Diagnosis present

## 2013-02-08 DIAGNOSIS — R339 Retention of urine, unspecified: Secondary | ICD-10-CM | POA: Diagnosis present

## 2013-02-08 DIAGNOSIS — D649 Anemia, unspecified: Secondary | ICD-10-CM | POA: Diagnosis not present

## 2013-02-08 DIAGNOSIS — Z433 Encounter for attention to colostomy: Secondary | ICD-10-CM | POA: Diagnosis not present

## 2013-02-08 DIAGNOSIS — N189 Chronic kidney disease, unspecified: Secondary | ICD-10-CM | POA: Diagnosis not present

## 2013-02-08 DIAGNOSIS — F329 Major depressive disorder, single episode, unspecified: Secondary | ICD-10-CM | POA: Diagnosis present

## 2013-02-08 DIAGNOSIS — F603 Borderline personality disorder: Secondary | ICD-10-CM | POA: Diagnosis present

## 2013-02-08 DIAGNOSIS — Z01812 Encounter for preprocedural laboratory examination: Secondary | ICD-10-CM | POA: Diagnosis not present

## 2013-02-08 LAB — CBC WITH DIFFERENTIAL/PLATELET
Eosinophils Relative: 2 % (ref 0–5)
HCT: 35.1 % — ABNORMAL LOW (ref 36.0–46.0)
Lymphocytes Relative: 18 % (ref 12–46)
Lymphs Abs: 0.7 10*3/uL (ref 0.7–4.0)
MCV: 79.8 fL (ref 78.0–100.0)
Neutro Abs: 2.5 10*3/uL (ref 1.7–7.7)
Platelets: 297 10*3/uL (ref 150–400)
RBC: 4.4 MIL/uL (ref 3.87–5.11)
WBC: 4 10*3/uL (ref 4.0–10.5)

## 2013-02-08 LAB — COMPREHENSIVE METABOLIC PANEL
ALT: 17 U/L (ref 0–35)
AST: 23 U/L (ref 0–37)
Alkaline Phosphatase: 137 U/L — ABNORMAL HIGH (ref 39–117)
CO2: 25 mEq/L (ref 19–32)
Calcium: 9.2 mg/dL (ref 8.4–10.5)
Chloride: 102 mEq/L (ref 96–112)
GFR calc Af Amer: 82 mL/min — ABNORMAL LOW (ref >90)
GFR calc non Af Amer: 71 mL/min — ABNORMAL LOW (ref >90)
Glucose, Bld: 108 mg/dL — ABNORMAL HIGH (ref 70–99)
Sodium: 138 mEq/L (ref 135–145)
Total Bilirubin: 0.3 mg/dL (ref 0.3–1.2)

## 2013-02-08 LAB — SURGICAL PCR SCREEN: Staphylococcus aureus: NEGATIVE

## 2013-02-08 NOTE — Pre-Procedure Instructions (Signed)
[name] [name]  02/08/2013   Your procedure is scheduled on:  Thursday February 15, 2013  Report to [facility] Stay [facility] at 0530 AM.  Call this number if you have problems the morning of surgery: [phone]   Remember:   Do not eat food or drink liquids after midnight.   Take these medicines the morning of surgery with A SIP OF WATER: Hydroxyzine [Vistaril ]   Do not wear jewelry, make-up or nail polish.  Do not wear lotions, powders, or perfumes. You may wear deodorant.  Do not shave 48 hours prior to surgery.   Do not bring valuables to the hospital.  Contacts, dentures or bridgework may not be worn into surgery.  Leave suitcase in the car. After surgery it may be brought to your room.  For patients admitted to the hospital, checkout time is 11:00 AM the day of  discharge.   Patients discharged the day of surgery will not be allowed to drive  home.    Special Instructions: Shower using CHG 2 nights before surgery and the night before surgery.  If you shower the day of surgery use CHG.  Use special wash - you have one bottle of CHG for all showers.  You should use approximately 1/3 of the bottle for each shower.   Please read over the following fact sheets that you were given: Pain Booklet, Coughing and Deep Breathing, MRSA Information and Surgical Site Infection Prevention

## 2013-02-14 MED ORDER — METRONIDAZOLE IN NACL 5-0.79 MG/ML-% IV SOLN
500.0000 mg | INTRAVENOUS | Status: AC
  Administered 2013-02-15: .5 g via INTRAVENOUS
  Filled 2013-02-14: qty 100

## 2013-02-14 MED ORDER — ALVIMOPAN 12 MG PO CAPS
12.0000 mg | ORAL_CAPSULE | Freq: Once | ORAL | Status: AC
  Administered 2013-02-15: 12 mg via ORAL
  Filled 2013-02-14 (×2): qty 1

## 2013-02-14 MED ORDER — DEXTROSE 5 % IV SOLN
3.0000 g | INTRAVENOUS | Status: AC
  Administered 2013-02-15: 3 g via INTRAVENOUS
  Filled 2013-02-14: qty 3000

## 2013-02-15 ENCOUNTER — Inpatient Hospital Stay (HOSPITAL_COMMUNITY)
Admission: RE | Admit: 2013-02-15 | Discharge: 2013-02-20 | DRG: 345 | Disposition: A | Discharge status: Home / Self Care | Payer: 59 | Source: Ambulatory Visit | Attending: General Surgery | Admitting: General Surgery

## 2013-02-15 ENCOUNTER — Encounter (HOSPITAL_COMMUNITY): Payer: Self-pay | Admitting: Anesthesiology

## 2013-02-15 ENCOUNTER — Encounter (HOSPITAL_COMMUNITY): Payer: Self-pay | Admitting: *Deleted

## 2013-02-15 ENCOUNTER — Inpatient Hospital Stay (HOSPITAL_COMMUNITY): Payer: 59 | Admitting: Anesthesiology

## 2013-02-15 ENCOUNTER — Encounter (HOSPITAL_COMMUNITY): Admission: RE | Disposition: A | Discharge status: Home / Self Care | Payer: Self-pay | Source: Ambulatory Visit

## 2013-02-15 DIAGNOSIS — F329 Major depressive disorder, single episode, unspecified: Secondary | ICD-10-CM | POA: Diagnosis present

## 2013-02-15 DIAGNOSIS — R339 Retention of urine, unspecified: Secondary | ICD-10-CM | POA: Diagnosis present

## 2013-02-15 DIAGNOSIS — D62 Acute posthemorrhagic anemia: Secondary | ICD-10-CM | POA: Diagnosis present

## 2013-02-15 DIAGNOSIS — F603 Borderline personality disorder: Secondary | ICD-10-CM | POA: Diagnosis present

## 2013-02-15 DIAGNOSIS — Z01812 Encounter for preprocedural laboratory examination: Secondary | ICD-10-CM

## 2013-02-15 DIAGNOSIS — Z433 Encounter for attention to colostomy: Principal | ICD-10-CM

## 2013-02-15 HISTORY — PX: COLOSTOMY CLOSURE: SHX1381

## 2013-02-15 SURGERY — COLOSTOMY CLOSURE
Anesthesia: General | Site: Abdomen | Wound class: Clean Contaminated

## 2013-02-15 MED ORDER — HYDROMORPHONE HCL PF 1 MG/ML IJ SOLN
0.5000 mg | INTRAMUSCULAR | Status: DC | PRN
  Administered 2013-02-15: 0.5 mg via INTRAVENOUS
  Filled 2013-02-15: qty 1

## 2013-02-15 MED ORDER — HYDROMORPHONE HCL PF 1 MG/ML IJ SOLN
INTRAMUSCULAR | Status: AC
  Filled 2013-02-15: qty 1

## 2013-02-15 MED ORDER — POVIDONE-IODINE 10 % EX OINT
TOPICAL_OINTMENT | CUTANEOUS | Status: DC | PRN
  Administered 2013-02-15: 1 via TOPICAL

## 2013-02-15 MED ORDER — ONDANSETRON HCL 4 MG/2ML IJ SOLN: 4.0000 mg | Freq: Four times a day (QID) | INTRAMUSCULAR | Status: DC | PRN

## 2013-02-15 MED ORDER — VECURONIUM BROMIDE 10 MG IV SOLR
INTRAVENOUS | Status: DC | PRN
  Administered 2013-02-15: 7 mg via INTRAVENOUS
  Administered 2013-02-15: 1 mg via INTRAVENOUS

## 2013-02-15 MED ORDER — HYDROMORPHONE HCL PF 1 MG/ML IJ SOLN
INTRAMUSCULAR | Status: AC
  Administered 2013-02-15: 1 mg via INTRAVENOUS
  Filled 2013-02-15: qty 1

## 2013-02-15 MED ORDER — HYDROXYZINE HCL 25 MG PO TABS
25.0000 mg | ORAL_TABLET | Freq: Two times a day (BID) | ORAL | Status: DC
  Administered 2013-02-15 – 2013-02-20 (×9): 25 mg via ORAL
  Filled 2013-02-15 (×12): qty 1

## 2013-02-15 MED ORDER — DEXAMETHASONE SODIUM PHOSPHATE 10 MG/ML IJ SOLN
INTRAMUSCULAR | Status: DC | PRN
  Administered 2013-02-15: 8 mg via INTRAVENOUS

## 2013-02-15 MED ORDER — PROPOFOL 10 MG/ML IV BOLUS
INTRAVENOUS | Status: DC | PRN
  Administered 2013-02-15: 200 mg via INTRAVENOUS

## 2013-02-15 MED ORDER — FENTANYL CITRATE 0.05 MG/ML IJ SOLN
INTRAMUSCULAR | Status: DC | PRN
  Administered 2013-02-15: 100 ug via INTRAVENOUS
  Administered 2013-02-15 (×3): 50 ug via INTRAVENOUS

## 2013-02-15 MED ORDER — HYDROMORPHONE HCL PF 1 MG/ML IJ SOLN
0.2500 mg | INTRAMUSCULAR | Status: DC | PRN
  Administered 2013-02-15 (×4): 0.5 mg via INTRAVENOUS

## 2013-02-15 MED ORDER — TRAZODONE HCL 150 MG PO TABS
300.0000 mg | ORAL_TABLET | Freq: Every day | ORAL | Status: DC
  Administered 2013-02-15 – 2013-02-19 (×5): 300 mg via ORAL
  Filled 2013-02-15 (×6): qty 2

## 2013-02-15 MED ORDER — POVIDONE-IODINE 10 % EX OINT
TOPICAL_OINTMENT | CUTANEOUS | Status: AC
  Filled 2013-02-15: qty 28.35

## 2013-02-15 MED ORDER — HYDROMORPHONE HCL PF 1 MG/ML IJ SOLN: 1.0000 mg | Freq: Once | INTRAMUSCULAR | Status: AC

## 2013-02-15 MED ORDER — HYDROXYZINE PAMOATE 25 MG PO CAPS
25.0000 mg | ORAL_CAPSULE | Freq: Two times a day (BID) | ORAL | Status: DC
  Administered 2013-02-15: 25 mg via ORAL
  Filled 2013-02-15 (×2): qty 1

## 2013-02-15 MED ORDER — LACTATED RINGERS IV SOLN
INTRAVENOUS | Status: DC | PRN
  Administered 2013-02-15 (×2): via INTRAVENOUS

## 2013-02-15 MED ORDER — OXYCODONE HCL 5 MG/5ML PO SOLN: 5.0000 mg | Freq: Once | ORAL | Status: DC | PRN

## 2013-02-15 MED ORDER — MIDAZOLAM HCL 5 MG/5ML IJ SOLN
INTRAMUSCULAR | Status: DC | PRN
  Administered 2013-02-15: 2 mg via INTRAVENOUS

## 2013-02-15 MED ORDER — KCL IN DEXTROSE-NACL 20-5-0.45 MEQ/L-%-% IV SOLN
INTRAVENOUS | Status: AC
  Administered 2013-02-15: 1000 mL
  Filled 2013-02-15: qty 1000

## 2013-02-15 MED ORDER — NEOSTIGMINE METHYLSULFATE 1 MG/ML IJ SOLN
INTRAMUSCULAR | Status: DC | PRN
  Administered 2013-02-15: 4 mg via INTRAVENOUS

## 2013-02-15 MED ORDER — 0.9 % SODIUM CHLORIDE (POUR BTL) OPTIME
TOPICAL | Status: DC | PRN
  Administered 2013-02-15 (×2): 1000 mL

## 2013-02-15 MED ORDER — KCL IN DEXTROSE-NACL 20-5-0.45 MEQ/L-%-% IV SOLN
INTRAVENOUS | Status: DC
  Administered 2013-02-15: 22:00:00 via INTRAVENOUS
  Administered 2013-02-16: 1000 mL via INTRAVENOUS
  Administered 2013-02-16 – 2013-02-18 (×6): via INTRAVENOUS
  Filled 2013-02-15 (×11): qty 1000

## 2013-02-15 MED ORDER — OXYCODONE HCL 5 MG PO TABS
5.0000 mg | ORAL_TABLET | ORAL | Status: DC | PRN
  Administered 2013-02-15 – 2013-02-16 (×4): 15 mg via ORAL
  Filled 2013-02-15 (×4): qty 3

## 2013-02-15 MED ORDER — ONDANSETRON HCL 4 MG/2ML IJ SOLN
INTRAMUSCULAR | Status: DC | PRN
  Administered 2013-02-15: 4 mg via INTRAVENOUS

## 2013-02-15 MED ORDER — ALVIMOPAN 12 MG PO CAPS
12.0000 mg | ORAL_CAPSULE | Freq: Two times a day (BID) | ORAL | Status: DC
  Administered 2013-02-16 – 2013-02-18 (×6): 12 mg via ORAL
  Filled 2013-02-15 (×8): qty 1

## 2013-02-15 MED ORDER — ZOLPIDEM TARTRATE 5 MG PO TABS: 5.0000 mg | ORAL_TABLET | Freq: Every evening | ORAL | Status: DC | PRN

## 2013-02-15 MED ORDER — ALUM & MAG HYDROXIDE-SIMETH 200-200-20 MG/5ML PO SUSP: 30.0000 mL | Freq: Four times a day (QID) | ORAL | Status: DC | PRN

## 2013-02-15 MED ORDER — TRAZODONE HCL 100 MG PO TABS
100.0000 mg | ORAL_TABLET | Freq: Every day | ORAL | Status: DC
  Filled 2013-02-15: qty 1

## 2013-02-15 MED ORDER — EPHEDRINE SULFATE 50 MG/ML IJ SOLN
INTRAMUSCULAR | Status: DC | PRN
  Administered 2013-02-15 (×2): 10 mg via INTRAVENOUS

## 2013-02-15 MED ORDER — OXYCODONE HCL 5 MG PO TABS: 5.0000 mg | ORAL_TABLET | Freq: Once | ORAL | Status: DC | PRN

## 2013-02-15 MED ORDER — GLYCOPYRROLATE 0.2 MG/ML IJ SOLN
INTRAMUSCULAR | Status: DC | PRN
  Administered 2013-02-15: .8 mg via INTRAVENOUS

## 2013-02-15 MED ORDER — RISPERIDONE 3 MG PO TABS
3.0000 mg | ORAL_TABLET | Freq: Every day | ORAL | Status: DC
  Administered 2013-02-15 – 2013-02-19 (×5): 3 mg via ORAL
  Filled 2013-02-15 (×6): qty 1

## 2013-02-15 MED ORDER — ENOXAPARIN SODIUM 40 MG/0.4ML ~~LOC~~ SOLN
40.0000 mg | SUBCUTANEOUS | Status: DC
  Administered 2013-02-16 – 2013-02-20 (×5): 40 mg via SUBCUTANEOUS
  Filled 2013-02-15 (×6): qty 0.4

## 2013-02-15 MED ORDER — METOCLOPRAMIDE HCL 5 MG/ML IJ SOLN: 10.0000 mg | Freq: Once | INTRAMUSCULAR | Status: DC | PRN

## 2013-02-15 SURGICAL SUPPLY — 66 items
BLADE SURG ROTATE 9660 (MISCELLANEOUS) ×2 IMPLANT
CANISTER SUCTION 2500CC (MISCELLANEOUS) ×2 IMPLANT
CLOTH BEACON ORANGE TIMEOUT ST (SAFETY) ×2 IMPLANT
COVER MAYO STAND STRL (DRAPES) ×2 IMPLANT
COVER SURGICAL LIGHT HANDLE (MISCELLANEOUS) ×2 IMPLANT
DRAPE LAPAROSCOPIC ABDOMINAL (DRAPES) ×2 IMPLANT
DRAPE PROXIMA HALF (DRAPES) IMPLANT
DRAPE UTILITY 15X26 W/TAPE STR (DRAPE) ×6 IMPLANT
DRAPE WARM FLUID 44X44 (DRAPE) ×2 IMPLANT
DRSG OPSITE POSTOP 3X4 (GAUZE/BANDAGES/DRESSINGS) ×2 IMPLANT
DRSG OPSITE POSTOP 4X10 (GAUZE/BANDAGES/DRESSINGS) ×2 IMPLANT
ELECT BLADE 4.0 EZ CLEAN MEGAD (MISCELLANEOUS) ×2
ELECT CAUTERY BLADE 6.4 (BLADE) ×2 IMPLANT
ELECT REM PT RETURN 9FT ADLT (ELECTROSURGICAL) ×2
ELECTRODE BLDE 4.0 EZ CLN MEGD (MISCELLANEOUS) ×1 IMPLANT
ELECTRODE REM PT RTRN 9FT ADLT (ELECTROSURGICAL) ×1 IMPLANT
GLOVE BIO SURGEON STRL SZ7 (GLOVE) ×4 IMPLANT
GLOVE BIO SURGEON STRL SZ7.5 (GLOVE) ×8 IMPLANT
GLOVE BIOGEL PI IND STRL 7.0 (GLOVE) ×1 IMPLANT
GLOVE BIOGEL PI IND STRL 7.5 (GLOVE) ×5 IMPLANT
GLOVE BIOGEL PI IND STRL 8 (GLOVE) ×2 IMPLANT
GLOVE BIOGEL PI INDICATOR 7.0 (GLOVE) ×1
GLOVE BIOGEL PI INDICATOR 7.5 (GLOVE) ×5
GLOVE BIOGEL PI INDICATOR 8 (GLOVE) ×2
GLOVE ECLIPSE 7.5 STRL STRAW (GLOVE) ×4 IMPLANT
GLOVE SS BIOGEL STRL SZ 6.5 (GLOVE) ×2 IMPLANT
GLOVE SUPERSENSE BIOGEL SZ 6.5 (GLOVE) ×2
GLOVE SURG SS PI 6.5 STRL IVOR (GLOVE) ×4 IMPLANT
GLOVE SURG SS PI 7.0 STRL IVOR (GLOVE) ×2 IMPLANT
GOWN STRL NON-REIN LRG LVL3 (GOWN DISPOSABLE) ×18 IMPLANT
KIT BASIN OR (CUSTOM PROCEDURE TRAY) ×2 IMPLANT
KIT ROOM TURNOVER OR (KITS) ×2 IMPLANT
LEGGING LITHOTOMY PAIR STRL (DRAPES) IMPLANT
LIGASURE IMPACT 36 18CM CVD LR (INSTRUMENTS) IMPLANT
NS IRRIG 1000ML POUR BTL (IV SOLUTION) ×4 IMPLANT
PACK GENERAL/GYN (CUSTOM PROCEDURE TRAY) ×2 IMPLANT
PAD ARMBOARD 7.5X6 YLW CONV (MISCELLANEOUS) ×4 IMPLANT
PAD SHARPS MAGNETIC DISPOSAL (MISCELLANEOUS) ×2 IMPLANT
SPECIMEN JAR MEDIUM (MISCELLANEOUS) ×2 IMPLANT
SPONGE LAP 18X18 X RAY DECT (DISPOSABLE) ×2 IMPLANT
STAPLER VISISTAT 35W (STAPLE) ×2 IMPLANT
SUCTION POOLE TIP (SUCTIONS) ×2 IMPLANT
SUT NOVA NAB DX-16 0-1 5-0 T12 (SUTURE) ×4 IMPLANT
SUT PDS AB 1 TP1 54 (SUTURE) IMPLANT
SUT PDS AB 1 TP1 96 (SUTURE) ×4 IMPLANT
SUT SILK 2 0 (SUTURE) ×1
SUT SILK 2 0 SH (SUTURE) ×2 IMPLANT
SUT SILK 2 0 SH CR/8 (SUTURE) ×6 IMPLANT
SUT SILK 2 0 TIES 10X30 (SUTURE) ×2 IMPLANT
SUT SILK 2-0 18XBRD TIE 12 (SUTURE) ×1 IMPLANT
SUT SILK 3 0 SH CR/8 (SUTURE) ×2 IMPLANT
SUT SILK 3 0 TIES 10X30 (SUTURE) ×2 IMPLANT
SUT VIC AB 1 CT1 18XCR BRD 8 (SUTURE) IMPLANT
SUT VIC AB 1 CT1 8-18 (SUTURE)
SUT VIC AB 2-0 CT1 27 (SUTURE)
SUT VIC AB 2-0 CT1 TAPERPNT 27 (SUTURE) IMPLANT
SUT VIC AB 3-0 54X BRD REEL (SUTURE) IMPLANT
SUT VIC AB 3-0 BRD 54 (SUTURE)
SUT VIC AB 3-0 SH 27 (SUTURE)
SUT VIC AB 3-0 SH 27X BRD (SUTURE) IMPLANT
SUT VIC AB 3-0 SH 8-18 (SUTURE) ×2 IMPLANT
SYR BULB IRRIGATION 50ML (SYRINGE) ×2 IMPLANT
TOWEL OR 17X26 10 PK STRL BLUE (TOWEL DISPOSABLE) ×4 IMPLANT
TRAY FOLEY CATH 14FRSI W/METER (CATHETERS) ×2 IMPLANT
WATER STERILE IRR 1000ML POUR (IV SOLUTION) ×2 IMPLANT
YANKAUER SUCT BULB TIP NO VENT (SUCTIONS) ×2 IMPLANT

## 2013-02-15 NOTE — Progress Notes (Signed)
Notified CC in OR that Ente[facility] is being tubed to their tubing station 76

## 2013-02-15 NOTE — Preoperative (Signed)
Beta Blockers   Reason not to administer Beta Blockers:Not Applicable 

## 2013-02-15 NOTE — Op Note (Signed)
OPERATIVE REPORT  DATE OF OPERATION: 02/15/2013  PATIENT:  [name] [name]  42 y.o. female  PRE-OPERATIVE DIAGNOSIS:  functioning colostomy  POST-OPERATIVE DIAGNOSIS:  functioning colostomy  PROCEDURE:  Procedure(s): COLOSTOMY CLOSURE  SURGEON:  Surgeon(s): Cherylynn Ridges, MD Emelia Loron, MD  ASSISTANT: Dwain Sarna  ANESTHESIA:   general  EBL: <100 ml  BLOOD ADMINISTERED: none  DRAINS: Urinary Catheter (Foley)   SPECIMEN:  No Specimen  COUNTS CORRECT:  YES  PROCEDURE DETAILS: The patient was taken to the operating room and placed on the table in supine position. After an adequate general endotracheal anesthetic was administered a proper time out was performed identifying the patient and the procedure to be performed, the colostomy in the right upper quadrant of the abdomen was closed using a running locking stitch of 2-0 silk and then the abdomen was prepped and draped in usual sterile manner.  We excised the patient's upper midline incision scar using a #10 blade then dissected down to and through the midline fascia using electrocautery. Once we entered the peritoneal cavity there were minimal adhesions however most of the adhesions were between the omentum and the anterior abdominal wall.  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 This was not sent as a specimen. The proximal descending colon was easily visualized once we taken most of the adhesions down. We able to mobilize up towards the midline of the wound. We subsequently excise the colostomy in the right upper quadrant and brought into the abdomen. Once this was done we performed an end-to-end anastomosis between the mid transverse colon and the proximal descending colon using interrupted full-thickness stitches of 2-0 silk. Once the anastomosis was completed we allowed this to fall down into the  abdomen without triangle is a mesenteric defect which was large.  The surgeon and assistant changed gown and gloves. We irrigated the peritoneal cavity with several liters of saline solution. We closed the abdominal fascia using running looped #1 PDS suture with intervening internal retention sutures of #1 Novafil. The ostomy site was closed using full thickness stitches of #1 Novafil.  The skin was closed using stainless steel staples. Sterile dressing was applied.  All needle counts, sponge counts, and instrument counts were correct.  PATIENT DISPOSITION:  PACU - hemodynamically stable.   Cherylynn Ridges 2/20/20149:44 AM

## 2013-02-15 NOTE — Anesthesia Postprocedure Evaluation (Signed)
Anesthesia Post Note  Patient: [name] [name]  Procedure(s) Performed: Procedure(s) (LRB): COLOSTOMY CLOSURE (N/A)  Anesthesia type: General  Patient location: PACU  Post pain: Pain level controlled  Post assessment: Patient's Cardiovascular Status Stable  Last Vitals:  Filed Vitals:   02/15/13 1121  BP: 102/45  Pulse: 64  Temp:   Resp: 20    Post vital signs: Reviewed and stable  Level of consciousness: alert  Complications: No apparent anesthesia complications

## 2013-02-15 NOTE — Anesthesia Procedure Notes (Signed)
Procedure Name: Intubation Date/Time: 02/15/2013 7:28 AM Performed by: Carmela Rima Pre-anesthesia Checklist: Patient identified, Timeout performed, Emergency Drugs available, Suction available and Patient being monitored Patient Re-evaluated:Patient Re-evaluated prior to inductionOxygen Delivery Method: Circle system utilized Preoxygenation: Pre-oxygenation with 100% oxygen Intubation Type: IV induction Ventilation: Mask ventilation without difficulty Laryngoscope Size: Mac and 3 Grade View: Grade I Tube type: Oral Tube size: 7.5 mm Number of attempts: 1 Placement Confirmation: ETT inserted through vocal cords under direct vision,  positive ETCO2 and breath sounds checked- equal and bilateral Secured at: 21 cm Tube secured with: Tape Dental Injury: Teeth and Oropharynx as per pre-operative assessment

## 2013-02-15 NOTE — Anesthesia Preprocedure Evaluation (Addendum)
Anesthesia Evaluation  Patient identified by MRN, date of birth, ID band Patient awake    Reviewed: Allergy & Precautions, H&P , NPO status , Patient's Chart, lab work & pertinent test results, reviewed documented beta blocker date and time   History of Anesthesia Complications (+) PONV  Airway Mallampati: II TM Distance: >3 FB Neck ROM: full    Dental  (+) Teeth Intact and Caps   Pulmonary neg pulmonary ROS,  breath sounds clear to auscultation        Cardiovascular + Valvular Problems/Murmurs Rhythm:regular     Neuro/Psych PSYCHIATRIC DISORDERS negative neurological ROS     GI/Hepatic negative GI ROS, Neg liver ROS,   Endo/Other  negative endocrine ROS  Renal/GU Renal disease  negative genitourinary   Musculoskeletal   Abdominal   Peds  Hematology  (+) Blood dyscrasia, anemia ,   Anesthesia Other Findings See surgeon's H&P   Reproductive/Obstetrics negative OB ROS                          Anesthesia Physical Anesthesia Plan  ASA: II  Anesthesia Plan: General   Post-op Pain Management:    Induction: Intravenous  Airway Management Planned: Oral ETT  Additional Equipment:   Intra-op Plan:   Post-operative Plan: Extubation in OR  Informed Consent: I have reviewed the patients History and Physical, chart, labs and discussed the procedure including the risks, benefits and alternatives for the proposed anesthesia with the patient or authorized representative who has indicated his/her understanding and acceptance.   Dental Advisory Given  Plan Discussed with: CRNA, Surgeon and Anesthesiologist  Anesthesia Plan Comments:        Anesthesia Quick Evaluation

## 2013-02-15 NOTE — Transfer of Care (Signed)
Immediate Anesthesia Transfer of Care Note  Patient: [name] [name]  Procedure(s) Performed: Procedure(s) with comments: COLOSTOMY CLOSURE (N/A) - Reversal of colostomy  Patient Location: PACU  Anesthesia Type:General  Level of Consciousness: awake and sedated  Airway & Oxygen Therapy: Patient Spontanous Breathing and Patient connected to nasal cannula oxygen  Post-op Assessment: Report given to PACU RN, Post -op Vital signs reviewed and stable and Patient moving all extremities X 4  Post vital signs: Reviewed and stable  Complications: No apparent anesthesia complications

## 2013-02-16 ENCOUNTER — Encounter (HOSPITAL_COMMUNITY): Payer: Self-pay | Admitting: General Surgery

## 2013-02-16 LAB — CBC
HCT: 30.3 % — ABNORMAL LOW (ref 36.0–46.0)
MCHC: 32 g/dL (ref 30.0–36.0)
MCV: 78.9 fL (ref 78.0–100.0)
RDW: 15.1 % (ref 11.5–15.5)
WBC: 12 10*3/uL — ABNORMAL HIGH (ref 4.0–10.5)

## 2013-02-16 LAB — BASIC METABOLIC PANEL
BUN: 6 mg/dL (ref 6–23)
CO2: 26 mEq/L (ref 19–32)
Chloride: 102 mEq/L (ref 96–112)
Creatinine, Ser: 0.87 mg/dL (ref 0.50–1.10)

## 2013-02-16 MED ORDER — DIPHENHYDRAMINE HCL 12.5 MG/5ML PO ELIX
12.5000 mg | ORAL_SOLUTION | Freq: Four times a day (QID) | ORAL | Status: DC | PRN
  Filled 2013-02-16: qty 5

## 2013-02-16 MED ORDER — NALOXONE HCL 0.4 MG/ML IJ SOLN: 0.4000 mg | INTRAMUSCULAR | Status: DC | PRN

## 2013-02-16 MED ORDER — DIPHENHYDRAMINE HCL 50 MG/ML IJ SOLN: 12.5000 mg | Freq: Four times a day (QID) | INTRAMUSCULAR | Status: DC | PRN

## 2013-02-16 MED ORDER — BETHANECHOL CHLORIDE 10 MG PO TABS
10.0000 mg | ORAL_TABLET | Freq: Four times a day (QID) | ORAL | Status: DC
  Administered 2013-02-16 – 2013-02-18 (×10): 10 mg via ORAL
  Filled 2013-02-16 (×15): qty 1

## 2013-02-16 MED ORDER — SODIUM CHLORIDE 0.9 % IJ SOLN: 9.0000 mL | INTRAMUSCULAR | Status: DC | PRN

## 2013-02-16 MED ORDER — HYDROMORPHONE 0.3 MG/ML IV SOLN
INTRAVENOUS | Status: DC
  Administered 2013-02-16: 17:00:00 via INTRAVENOUS
  Administered 2013-02-16: 1.5 mg via INTRAVENOUS
  Administered 2013-02-16: 0.8 mg via INTRAVENOUS
  Administered 2013-02-16: 0.3 mg via INTRAVENOUS
  Administered 2013-02-16: 1.5 mg via INTRAVENOUS
  Administered 2013-02-16: 4.5 mg via INTRAVENOUS
  Administered 2013-02-16: 09:00:00 via INTRAVENOUS
  Administered 2013-02-17: 0.9 mg via INTRAVENOUS
  Administered 2013-02-17: 1.8 mg via INTRAVENOUS
  Administered 2013-02-17: 1.5 mg via INTRAVENOUS
  Administered 2013-02-17: 2.7 mg via INTRAVENOUS
  Administered 2013-02-17: 09:00:00 via INTRAVENOUS
  Administered 2013-02-18: 0.9 mg via INTRAVENOUS
  Administered 2013-02-18: 1.2 mg via INTRAVENOUS
  Administered 2013-02-18: 16:00:00 via INTRAVENOUS
  Administered 2013-02-19 (×2): 0.9 mg via INTRAVENOUS
  Filled 2013-02-16 (×4): qty 25

## 2013-02-16 MED ORDER — TRAMADOL HCL 50 MG PO TABS
100.0000 mg | ORAL_TABLET | Freq: Four times a day (QID) | ORAL | Status: DC
  Administered 2013-02-16 – 2013-02-20 (×15): 100 mg via ORAL
  Filled 2013-02-16 (×4): qty 2
  Filled 2013-02-16: qty 1
  Filled 2013-02-16 (×18): qty 2

## 2013-02-16 NOTE — Progress Notes (Signed)
Notified Dr. Janee Morn that pt had complained of slight nausea earlier in the evening which had resolved but that pt does not have any orders for nausea medication if needed. Received order for IV Zofran.

## 2013-02-16 NOTE — Progress Notes (Signed)
Patient ID: [name] [name], female   DOB: 1971/04/09, 42 y.o.   MRN: [id number]   LOS: 1 day  POD#1  Subjective: C/o pain mostly, says medicine adequate but still in significant pain. Minimal nausea, no emesis or flatus.  Objective: Vital signs in last 24 hours: Temp:  [97.2 F (36.2 C)-99.1 F (37.3 C)] 99.1 F (37.3 C) (02/21 0525) Pulse Rate:  [62-89] 84 (02/21 0525) Resp:  [12-28] 18 (02/21 0525) BP: (99-115)/(44-64) 114/64 mmHg (02/21 0525) SpO2:  [96 %-100 %] 96 % (02/21 0525) Weight:  [170 lb 4.8 oz (77.248 kg)] 170 lb 4.8 oz (77.248 kg) (02/20 1300) Last BM Date: 02/15/13 (from ostomy)   Lab Results:  CBC  Recent Labs  02/16/13 0610  WBC 12.0*  HGB 9.7*  HCT 30.3*  PLT 344    General appearance: alert and mild distress Resp: clear to auscultation bilaterally Cardio: regular rate and rhythm GI: Soft, absent BS. Incisions WNL.   Assessment/Plan: S/p colostomy reversal Acute on chronic anemia -- Monitor Ileus Depression -- Home meds FEN -- Will change to PCA. Continue clears. VTE -- Lovenox Dispo -- Ileus    Freeman Caldron, PA-C Pager: [phone] General Trauma PA Pager: [phone]   02/16/2013

## 2013-02-16 NOTE — Progress Notes (Signed)
Patient in pain, but nothing out of the expected.  Will get PCA started.  This patient has been seen and I agree with the findings and treatment plan.  Marta Lamas. Gae Bon, MD, FACS [phone] (pager) [phone] (direct pager) Trauma Surgeon

## 2013-02-17 LAB — CBC
Hemoglobin: 8.4 g/dL — ABNORMAL LOW (ref 12.0–15.0)
MCH: 25.9 pg — ABNORMAL LOW (ref 26.0–34.0)
MCHC: 32.4 g/dL (ref 30.0–36.0)
MCV: 79.9 fL (ref 78.0–100.0)
Platelets: 294 10*3/uL (ref 150–400)
RBC: 3.24 MIL/uL — ABNORMAL LOW (ref 3.87–5.11)

## 2013-02-17 NOTE — Progress Notes (Signed)
2 Days Post-Op   Assessment: s/p Procedure(s): COLOSTOMY CLOSURE Patient Active Problem List  Diagnosis  . Renal calculus, left  . Hydronephrosis, left  . Acute blood loss anemia  . Depression, major  . Borderline personality disorder  . History of colostomy    Stable post op, not ready to advance diet. Hgb drifted down, will recheck in am.   Plan: Slow IV today, recheck Hgb in AM  Subjective: Better pain control today, ambulating in halls, not passed gas, no nausea on clears  Objective: Vital signs in last 24 hours: Temp:  [97.8 F (36.6 C)-98.7 F (37.1 C)] 98.6 F (37 C) (02/22 0535) Pulse Rate:  [90-107] 107 (02/22 0535) Resp:  [12-24] 13 (02/22 0535) BP: (99-119)/(53-73) 99/55 mmHg (02/22 0535) SpO2:  [93 %-100 %] 93 % (02/22 0535)   Intake/Output from previous day: 02/21 0701 - 02/22 0700 In: 3518.1 [P.O.:720; I.V.:2798.1] Out: 2550 [Urine:2550] Intake/Output this shift:     General appearance: alert, cooperative and no distress Resp: clear to auscultation bilaterally Cardio: regular rate and rhythm, S1, S2 normal, no murmur, click, rub or gallop GI: mild incisional tenderness, no BS yet  Incision: healing well  Lab Results:   Recent Labs  02/16/13 0610 02/17/13 0700  WBC 12.0* 8.6  HGB 9.7* 8.4*  HCT 30.3* 25.9*  PLT 344 294   BMET  Recent Labs  02/16/13 0610  NA 136  K 4.3  CL 102  CO2 26  GLUCOSE 165*  BUN 6  CREATININE 0.87  CALCIUM 8.6   PT/INR No results found for this basename: LABPROT, INR,  in the last 72 hours ABG No results found for this basename: PHART, PCO2, PO2, HCO3,  in the last 72 hours  MEDS, Scheduled . alvimopan  12 mg Oral BID  . bethanechol  10 mg Oral QID  . enoxaparin (LOVENOX) injection  40 mg Subcutaneous Q24H  . HYDROmorphone PCA 0.3 mg/mL   Intravenous Q4H  . hydrOXYzine  25 mg Oral BID  . risperiDONE  3 mg Oral QHS  . traMADol  100 mg Oral Q6H  . traZODone  300 mg Oral QHS     Studies/Results: No results found.    LOS: 2 days     Currie Paris, MD, [facility], [place] [phone]   02/17/2013 8:32 AM

## 2013-02-18 LAB — CBC
HCT: 26.2 % — ABNORMAL LOW (ref 36.0–46.0)
MCHC: 32.1 g/dL (ref 30.0–36.0)
MCV: 79.4 fL (ref 78.0–100.0)
Platelets: 304 10*3/uL (ref 150–400)
RDW: 15 % (ref 11.5–15.5)
WBC: 7.7 10*3/uL (ref 4.0–10.5)

## 2013-02-18 NOTE — Progress Notes (Signed)
3 Days Post-Op   Assessment: s/p Procedure(s): COLOSTOMY CLOSURE Patient Active Problem List  Diagnosis  . Renal calculus, left  . Hydronephrosis, left  . Acute blood loss anemia  . Depression, major  . Borderline personality disorder  . History of colostomy    Stable, waiting for bowel function to return Anemia, repeat cbc pending  Plan: No change today. Continue IV fluidas and leave on clears  Subjective: Feels OK, good pain control, ambulating in halls, not much discomfort, not passed gas or had a BM yet.  Objective: Vital signs in last 24 hours: Temp:  [98 F (36.7 C)-100 F (37.8 C)] 98.6 F (37 C) (02/23 0540) Pulse Rate:  [68-102] 93 (02/23 0540) Resp:  [11-18] 18 (02/23 0540) BP: (93-111)/(53-69) 97/61 mmHg (02/23 0540) SpO2:  [91 %-100 %] 97 % (02/23 0540)   Intake/Output from previous day: 02/22 0701 - 02/23 0700 In: 1700 [P.O.:800; I.V.:900] Out: 400 [Urine:400] Intake/Output this shift:     General appearance: alert, cooperative and no distress Resp: clear to auscultation bilaterally Cardio: regular rate and rhythm, S1, S2 normal, no murmur, click, rub or gallop GI: Soft, mild incisional tenderness, no BS yet  Incision: healing well  Lab Results:   Recent Labs  02/16/13 0610 02/17/13 0700  WBC 12.0* 8.6  HGB 9.7* 8.4*  HCT 30.3* 25.9*  PLT 344 294   BMET  Recent Labs  02/16/13 0610  NA 136  K 4.3  CL 102  CO2 26  GLUCOSE 165*  BUN 6  CREATININE 0.87  CALCIUM 8.6   PT/INR No results found for this basename: LABPROT, INR,  in the last 72 hours ABG No results found for this basename: PHART, PCO2, PO2, HCO3,  in the last 72 hours  MEDS, Scheduled . alvimopan  12 mg Oral BID  . bethanechol  10 mg Oral QID  . enoxaparin (LOVENOX) injection  40 mg Subcutaneous Q24H  . HYDROmorphone PCA 0.3 mg/mL   Intravenous Q4H  . hydrOXYzine  25 mg Oral BID  . risperiDONE  3 mg Oral QHS  . traMADol  100 mg Oral Q6H  . traZODone  300 mg  Oral QHS    Studies/Results: No results found.    LOS: 3 days     Currie Paris, MD, [facility], [place] [phone]   02/18/2013 8:08 AM

## 2013-02-19 MED ORDER — HYDROCODONE-ACETAMINOPHEN 10-325 MG PO TABS
0.5000 | ORAL_TABLET | ORAL | Status: DC | PRN
  Administered 2013-02-19 – 2013-02-20 (×3): 2 via ORAL
  Filled 2013-02-19 (×3): qty 2

## 2013-02-19 MED ORDER — BETHANECHOL CHLORIDE 10 MG PO TABS
10.0000 mg | ORAL_TABLET | Freq: Three times a day (TID) | ORAL | Status: DC
Start: 2013-02-19 — End: 2013-02-20
  Administered 2013-02-19 – 2013-02-20 (×4): 10 mg via ORAL
  Filled 2013-02-19 (×6): qty 1

## 2013-02-19 MED ORDER — POLYETHYLENE GLYCOL 3350 17 G PO PACK
17.0000 g | PACK | Freq: Every day | ORAL | Status: DC
  Administered 2013-02-20: 17 g via ORAL
  Filled 2013-02-19 (×2): qty 1

## 2013-02-19 MED ORDER — DOCUSATE SODIUM 100 MG PO CAPS
100.0000 mg | ORAL_CAPSULE | Freq: Two times a day (BID) | ORAL | Status: DC
  Administered 2013-02-19 – 2013-02-20 (×3): 100 mg via ORAL
  Filled 2013-02-19 (×3): qty 1

## 2013-02-19 NOTE — Progress Notes (Signed)
Patient ID: [name] [name], female   DOB: 10-22-1971, 42 y.o.   MRN: [id number]   LOS: 4 days  POD#4  Subjective: Denies N/V. +flatus this am. No difficulty with voiding since first day.  Objective: Vital signs in last 24 hours: Temp:  [98.2 F (36.8 C)-98.9 F (37.2 C)] 98.6 F (37 C) (02/24 0549) Pulse Rate:  [77-95] 87 (02/24 0549) Resp:  [12-22] 12 (02/24 0756) BP: (97-105)/(50-58) 105/50 mmHg (02/24 0549) SpO2:  [95 %-99 %] 96 % (02/24 0756) Last BM Date: 02/15/13   Physical Exam General appearance: alert and no distress Resp: clear to auscultation bilaterally Cardio: regular rate and rhythm GI: Soft, appropriately TTP, incision C/D/I, +BS   Assessment/Plan: S/p colostomy reversal  Acute on chronic anemia -- Monitor  Ileus -- Will advance to fulls Urinary retention -- Wean urecholine Depression -- Home meds  FEN -- Orals for pain  VTE -- Lovenox  Dispo -- Ileus    Freeman Caldron, PA-C Pager: [phone] General Trauma PA Pager: [phone]   02/19/2013

## 2013-02-19 NOTE — Progress Notes (Signed)
Had small BM, trying fulls lunch now. If continues to do well, will plan advancing diet and likely D/C in AM Patient examined and I agree with the assessment and plan  [name], MD, MPH, FACS Pager: [phone]  02/19/2013 1:06 PM

## 2013-02-19 NOTE — Progress Notes (Signed)
UR completed 

## 2013-02-20 MED ORDER — DSS 100 MG PO CAPS: 100.0000 mg | ORAL_CAPSULE | Freq: Two times a day (BID) | ORAL | Status: DC

## 2013-02-20 MED ORDER — TRAMADOL HCL 50 MG PO TABS: 100.0000 mg | ORAL_TABLET | Freq: Four times a day (QID) | ORAL | Status: DC | PRN

## 2013-02-20 MED ORDER — HYDROCODONE-ACETAMINOPHEN 10-325 MG PO TABS: 0.5000 | ORAL_TABLET | Freq: Four times a day (QID) | ORAL | Status: DC | PRN

## 2013-02-20 NOTE — Progress Notes (Signed)
VERBALLY UNDERSTOOD DC INSTRUCTIONS, NO QUESTIONS ASKED, DC HOME WITH HUSBAND
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
## 2013-02-22 ENCOUNTER — Telehealth (INDEPENDENT_AMBULATORY_CARE_PROVIDER_SITE_OTHER): Payer: Self-pay | Admitting: General Surgery

## 2013-02-22 NOTE — Telephone Encounter (Signed)
Pt called stating wound was more painful, red and warm to touch.  No drainage, no fevers.  She is tolerating liquids but not eating much.  She is urinating well and had a BM yesterday.  i asked her to come into urgent clinic tomorrow to look at her wound.  She will call the office in the AM.

## 2013-02-23 ENCOUNTER — Ambulatory Visit (INDEPENDENT_AMBULATORY_CARE_PROVIDER_SITE_OTHER): Payer: 59 | Admitting: Surgery

## 2013-02-23 ENCOUNTER — Encounter (INDEPENDENT_AMBULATORY_CARE_PROVIDER_SITE_OTHER): Payer: Self-pay | Admitting: Surgery

## 2013-02-23 VITALS — BP 102/60 | HR 76 | Temp 97.1°F | Resp 24 | Ht 64.0 in | Wt 163.0 lb

## 2013-02-23 MED ORDER — METRONIDAZOLE 500 MG PO TABS: 500.0000 mg | ORAL_TABLET | Freq: Three times a day (TID) | ORAL | Status: DC

## 2013-02-23 MED ORDER — CIPROFLOXACIN HCL 500 MG PO TABS: 500.0000 mg | ORAL_TABLET | Freq: Two times a day (BID) | ORAL | Status: DC

## 2013-02-23 NOTE — Progress Notes (Signed)
General Surgery [facility]  Visit Diagnoses: 1. Colostomy in place   2. Cellulitis, abdominal wound     HISTORY: Patient returns to the office for evaluation of her abdominal wound. She underwent colostomy closure on February 20. She was discharged from the hospital on February 25. Over the past 24-48 hours she has developed pain, erythema, and low-grade fever.  EXAM: Abdominal incision remains closed with staples in place. There is no fluctuance. There is no drainage. There is erythema and induration surrounding the midline incision and extending to the right towards the site of colostomy closure. With manipulation I am unable to show any drainage from the incision.  IMPRESSION: Cellulitis abdominal wall following colostomy closure  PLAN: I discussed these findings with the patient and her husband. I am going to start her on Cipro and Flagyl for 10 days. I have told them that if she has persistent fevers or develops drainage from the wound to contact our office or go to the emergency department for further evaluation. At this point I do not think the wound needs to be opened. She is scheduled to see her surgeon in this office in 5 days.  I have also renewed her prescription for narcotic pain medication at her request.  Velora Heckler, MD, FACS General & Endocrine Surgery [facility]

## 2013-02-27 ENCOUNTER — Encounter (HOSPITAL_COMMUNITY): Payer: Self-pay | Admitting: *Deleted

## 2013-02-27 ENCOUNTER — Observation Stay (HOSPITAL_COMMUNITY)
Admission: AD | Admit: 2013-02-27 | Discharge: 2013-03-02 | Disposition: A | Discharge status: Home / Self Care | Payer: 59 | Source: Ambulatory Visit | Attending: General Surgery | Admitting: General Surgery

## 2013-02-27 ENCOUNTER — Observation Stay (HOSPITAL_COMMUNITY): Payer: 59

## 2013-02-27 ENCOUNTER — Ambulatory Visit (INDEPENDENT_AMBULATORY_CARE_PROVIDER_SITE_OTHER): Payer: 59 | Admitting: General Surgery

## 2013-02-27 ENCOUNTER — Encounter (INDEPENDENT_AMBULATORY_CARE_PROVIDER_SITE_OTHER): Payer: Self-pay | Admitting: General Surgery

## 2013-02-27 VITALS — BP 92/62 | HR 76 | Temp 97.3°F | Resp 18 | Ht 64.0 in | Wt 162.0 lb

## 2013-02-27 DIAGNOSIS — L02211 Cutaneous abscess of abdominal wall: Secondary | ICD-10-CM

## 2013-02-27 DIAGNOSIS — N200 Calculus of kidney: Secondary | ICD-10-CM | POA: Insufficient documentation

## 2013-02-27 DIAGNOSIS — L02219 Cutaneous abscess of trunk, unspecified: Secondary | ICD-10-CM | POA: Insufficient documentation

## 2013-02-27 DIAGNOSIS — N1330 Unspecified hydronephrosis: Secondary | ICD-10-CM | POA: Insufficient documentation

## 2013-02-27 DIAGNOSIS — T8140XA Infection following a procedure, unspecified, initial encounter: Principal | ICD-10-CM | POA: Insufficient documentation

## 2013-02-27 DIAGNOSIS — F603 Borderline personality disorder: Secondary | ICD-10-CM | POA: Insufficient documentation

## 2013-02-27 DIAGNOSIS — R112 Nausea with vomiting, unspecified: Secondary | ICD-10-CM | POA: Insufficient documentation

## 2013-02-27 DIAGNOSIS — D62 Acute posthemorrhagic anemia: Secondary | ICD-10-CM | POA: Insufficient documentation

## 2013-02-27 DIAGNOSIS — F329 Major depressive disorder, single episode, unspecified: Secondary | ICD-10-CM | POA: Insufficient documentation

## 2013-02-27 HISTORY — DX: Disorder of kidney and ureter, unspecified: N28.9

## 2013-02-27 LAB — BASIC METABOLIC PANEL
BUN: 10 mg/dL (ref 6–23)
Chloride: 97 mEq/L (ref 96–112)
Glucose, Bld: 105 mg/dL — ABNORMAL HIGH (ref 70–99)
Potassium: 3.6 mEq/L (ref 3.5–5.1)

## 2013-02-27 LAB — CBC
HCT: 29 % — ABNORMAL LOW (ref 36.0–46.0)
Hemoglobin: 9.3 g/dL — ABNORMAL LOW (ref 12.0–15.0)
MCH: 24.7 pg — ABNORMAL LOW (ref 26.0–34.0)
MCHC: 32.1 g/dL (ref 30.0–36.0)
MCV: 77.1 fL — ABNORMAL LOW (ref 78.0–100.0)

## 2013-02-27 MED ORDER — ONDANSETRON HCL 4 MG/2ML IJ SOLN
4.0000 mg | Freq: Four times a day (QID) | INTRAMUSCULAR | Status: DC | PRN
  Administered 2013-02-27 – 2013-03-01 (×3): 4 mg via INTRAVENOUS
  Filled 2013-02-27 (×3): qty 2

## 2013-02-27 MED ORDER — METRONIDAZOLE IN NACL 5-0.79 MG/ML-% IV SOLN
500.0000 mg | Freq: Three times a day (TID) | INTRAVENOUS | Status: DC
  Administered 2013-02-27 – 2013-03-02 (×8): 500 mg via INTRAVENOUS
  Filled 2013-02-27 (×11): qty 100

## 2013-02-27 MED ORDER — SODIUM CHLORIDE 0.9 % IV SOLN
INTRAVENOUS | Status: DC
  Administered 2013-02-28 (×2): via INTRAVENOUS

## 2013-02-27 MED ORDER — HYDROXYZINE HCL 25 MG PO TABS
25.0000 mg | ORAL_TABLET | Freq: Two times a day (BID) | ORAL | Status: DC
  Administered 2013-02-27 – 2013-03-02 (×6): 25 mg via ORAL
  Filled 2013-02-27 (×8): qty 1

## 2013-02-27 MED ORDER — IOHEXOL 300 MG/ML  SOLN
100.0000 mL | Freq: Once | INTRAMUSCULAR | Status: AC | PRN
  Administered 2013-02-27: 100 mL via INTRAVENOUS

## 2013-02-27 MED ORDER — DOCUSATE SODIUM 100 MG PO CAPS
100.0000 mg | ORAL_CAPSULE | Freq: Two times a day (BID) | ORAL | Status: DC
  Administered 2013-02-27 – 2013-03-02 (×5): 100 mg via ORAL
  Filled 2013-02-27 (×6): qty 1

## 2013-02-27 MED ORDER — HYDROMORPHONE HCL PF 1 MG/ML IJ SOLN
0.5000 mg | Freq: Once | INTRAMUSCULAR | Status: AC
  Administered 2013-02-27: 22:00:00 via INTRAVENOUS

## 2013-02-27 MED ORDER — PANTOPRAZOLE SODIUM 40 MG IV SOLR
40.0000 mg | Freq: Every day | INTRAVENOUS | Status: DC
  Administered 2013-02-27 – 2013-02-28 (×2): 40 mg via INTRAVENOUS
  Filled 2013-02-27 (×5): qty 40

## 2013-02-27 MED ORDER — CIPROFLOXACIN IN D5W 400 MG/200ML IV SOLN
400.0000 mg | Freq: Two times a day (BID) | INTRAVENOUS | Status: DC
  Administered 2013-02-27 – 2013-03-02 (×6): 400 mg via INTRAVENOUS
  Filled 2013-02-27 (×8): qty 200

## 2013-02-27 MED ORDER — TRAZODONE HCL 100 MG PO TABS
300.0000 mg | ORAL_TABLET | Freq: Every day | ORAL | Status: DC
  Administered 2013-02-27 – 2013-03-01 (×3): 300 mg via ORAL
  Filled 2013-02-27 (×3): qty 2
  Filled 2013-02-27 (×2): qty 3
  Filled 2013-02-27 (×2): qty 2

## 2013-02-27 MED ORDER — SODIUM CHLORIDE 0.9 % IV SOLN
750.0000 mg | Freq: Two times a day (BID) | INTRAVENOUS | Status: DC
  Administered 2013-02-28 – 2013-03-02 (×5): 750 mg via INTRAVENOUS
  Filled 2013-02-27 (×7): qty 750

## 2013-02-27 MED ORDER — HYDROMORPHONE HCL PF 1 MG/ML IJ SOLN
1.0000 mg | INTRAMUSCULAR | Status: DC | PRN
  Administered 2013-02-27 – 2013-02-28 (×4): 1 mg via INTRAVENOUS
  Filled 2013-02-27 (×5): qty 1

## 2013-02-27 MED ORDER — TRAMADOL HCL 50 MG PO TABS: 100.0000 mg | ORAL_TABLET | Freq: Four times a day (QID) | ORAL | Status: DC | PRN

## 2013-02-27 MED ORDER — HYDROXYZINE PAMOATE 25 MG PO CAPS
25.0000 mg | ORAL_CAPSULE | Freq: Two times a day (BID) | ORAL | Status: DC
  Filled 2013-02-27: qty 1

## 2013-02-27 MED ORDER — RISPERIDONE 3 MG PO TABS
3.0000 mg | ORAL_TABLET | Freq: Every day | ORAL | Status: DC
  Administered 2013-02-27 – 2013-03-01 (×3): 3 mg via ORAL
  Filled 2013-02-27 (×6): qty 1

## 2013-02-27 MED ORDER — IOHEXOL 300 MG/ML  SOLN
25.0000 mL | INTRAMUSCULAR | Status: AC
  Administered 2013-02-27 (×2): 25 mL via ORAL

## 2013-02-27 MED ORDER — DEXTROSE-NACL 5-0.9 % IV SOLN
INTRAVENOUS | Status: DC
  Administered 2013-02-27: 18:00:00 via INTRAVENOUS

## 2013-02-27 MED ORDER — VANCOMYCIN HCL 10 G IV SOLR
1500.0000 mg | Freq: Once | INTRAVENOUS | Status: AC
  Administered 2013-02-27: 1500 mg via INTRAVENOUS
  Filled 2013-02-27: qty 1500

## 2013-02-27 MED ORDER — HYDROCODONE-ACETAMINOPHEN 5-325 MG PO TABS
1.0000 | ORAL_TABLET | ORAL | Status: DC | PRN
  Administered 2013-02-28 – 2013-03-01 (×4): 2 via ORAL
  Filled 2013-02-27 (×4): qty 2

## 2013-02-27 NOTE — Progress Notes (Addendum)
ANTIBIOTIC CONSULT NOTE - INITIAL  Pharmacy Consult for vancomycin Indication: abdominal wall infection  Allergies  Allergen Reactions  . Augmentin (Amoxicillin-Pot Clavulanate) Itching, Swelling and Rash  . Avelox (Moxifloxacin Hcl In Nacl) Itching, Swelling and Rash  . Sodium Hydroxide Rash   Vital Signs: Temp: 97.3 F (36.3 C) (03/04 1412) Temp src: Temporal (03/04 1412) BP: 92/62 mmHg (03/04 1412) Pulse Rate: 76 (03/04 1412) Intake/Output from previous day:   Intake/Output from this shift:    Labs: No results found for this basename: WBC, HGB, PLT, LABCREA, CREATININE,  in the last 72 hours The CrCl is unknown because both a height and weight (above a minimum accepted value) are required for this calculation. No results found for this basename: VANCOTROUGH, VANCOPEAK, VANCORANDOM, GENTTROUGH, GENTPEAK, GENTRANDOM, TOBRATROUGH, TOBRAPEAK, TOBRARND, AMIKACINPEAK, AMIKACINTROU, AMIKACIN,  in the last 72 hours   Microbiology: No results found for this or any previous visit (from the past 720 hour(s)).  Medical History: Past Medical History  Diagnosis Date  . Heart murmur     asa child   . Anemia     hx of   . Chronic kidney disease     kidney stone   . Depression   . Anxiety   . Anemia   . Chronic kidney disease   . Blood dyscrasia     Medications:  Prescriptions prior to admission  Medication Sig Dispense Refill  . ciprofloxacin (CIPRO) 500 MG tablet Take 500 mg by mouth 2 (two) times daily. Started last Friday, 02-31-14      . docusate sodium 100 MG CAPS Take 100 mg by mouth 2 (two) times daily.  20 capsule  0  . HYDROcodone-acetaminophen (NORCO) 10-325 MG per tablet Take 0.5-2 tablets by mouth every 6 (six) hours as needed (1/2 tablet for mild pain, 1 tablet for moderate pain, 2 tablets for severe pain).  40 tablet  0  . hydrOXYzine (VISTARIL) 25 MG capsule Take 25 mg by mouth 2 (two) times daily.       . metroNIDAZOLE (FLAGYL) 500 MG tablet Take 500 mg by  mouth 3 (three) times daily. Started last Friday 02-31-14      . Multiple Vitamin (MULTIVITAMIN WITH MINERALS) TABS Take 1 tablet by mouth daily.      . traMADol (ULTRAM) 50 MG tablet Take 2 tablets (100 mg total) by mouth every 6 (six) hours as needed for pain.  30 tablet  0  . traZODone (DESYREL) 100 MG tablet Take 300 mg by mouth at bedtime.      . [DISCONTINUED] ciprofloxacin (CIPRO) 500 MG tablet Take 1 tablet (500 mg total) by mouth 2 (two) times daily.  20 tablet  0  . [DISCONTINUED] metroNIDAZOLE (FLAGYL) 500 MG tablet Take 1 tablet (500 mg total) by mouth 3 (three) times daily.  30 tablet  0  . risperiDONE (RISPERDAL) 3 MG tablet Take 3 mg by mouth at bedtime.       Assessment: 56 yof presented with abdominal pain. She has a recent history of cellulitis s/p ostomy takedown on 2/20. PTA she was on cipro + flagy. These will be continued inpatient. She will also start on IV vancomycin. No labs are available at this time. Of note, pt has a history of CKD and recent decreased PO intake.   Goal of Therapy:  Vancomycin trough level 10-15 mcg/ml  Plan:  1. Vancomycin 1500mg  IV x 1 2. F/u labs for further vanc dosing 3. F/u renal fxn, C&S, clinical status and trough at  SS  Rumbarger, Drake Leach 02/27/2013,4:51 PM  Addendum: CrCl is ~47ml/hr. After load of 1500mg  IV x 1 will start 750mg  IV Q12H tomorrow.   Lysle Pearl, PharmD, BCPS Pager # [phone] 02/27/2013 5:59 PM

## 2013-02-27 NOTE — Progress Notes (Signed)
Patient admit to N6 at 1720.  Orient patient to staff and unit.  Patient is NPO, and has order for CT with contrast.  Dr. Renard Matter called via telephone order to get contrast by mouth once.

## 2013-02-27 NOTE — Progress Notes (Signed)
Patient ID: [name] [name], female   DOB: 1971/05/23, 42 y.o.   MRN: [id number] The patient is a 42 year old female status post ostomy takedown by Dr. Lindie Spruce on 02/15/2013.  The patient was seen in urgent clinic and was started on by mouth antibiotics secondary to abdominal wound cellulitis. The patient states that since time she continue her antibiotics, has had decreased by mouth intake, has been febrile at home to attempt of 101. The patient states that for some clear drainage coming from her midline wound.  On exam: There is an area of cellulitis in the midline wound with cellulitis surrounding her staples as well as ostomy site. I removed several staples just superior to the umbilicus and was able to express a moderate amount of purulence.  Assessment and plan: The patient will be admitted to [place] for IV antibiotics, IV fluid hydration, and CT scan of abdomen and pelvis to look for possible abscess.  I will notify the doctor the week as well as Dr. Lindie Spruce in regards to this patient's status.

## 2013-02-27 NOTE — Progress Notes (Addendum)
Patient ID: [name] [name], female   DOB: 02/28/1971, 42 y.o.   MRN: [id number]  I reviewed the pt's CT scan with the radiologists who agree there is a superficial skin infection and abscess.  There is no IAA.   I removed several staples from her midline and a moderate amount of purulence was expressed and packed her wound.  A culture was taken and sent from her wound.  The skin over her previous ostomy site was epithelialized well and that skin could not be opened up.  There was some minimal superficial tracking from the midline to the ostomy site.  The midline wound was packed with a strip of kerlix.  Pt tolerated the procedure well.

## 2013-02-27 NOTE — H&P (Addendum)
[name] [name] is an 42 y.o. female.   Chief Complaint: abd wall pain HPI: The patient is a 42 year old female status post ostomy takedown by Dr. Lindie Spruce on 02/15/2013. The patient was seen in urgent clinic and was started on by mouth antibiotics secondary to abdominal wound cellulitis. The patient states that since time she continue her antibiotics, has had decreased by mouth intake, has been febrile at home to attempt of 101. The patient states that for some clear drainage coming from her midline wound.   Past Medical History  Diagnosis Date  . Heart murmur     asa child   . Anemia     hx of   . Chronic kidney disease     kidney stone   . Depression   . Anxiety   . Anemia   . Chronic kidney disease   . Blood dyscrasia     Past Surgical History  Procedure Laterality Date  . Other surgical history      cyst removed from ovary ? side   . Nephrolithotomy  03/31/2012    Procedure: NEPHROLITHOTOMY PERCUTANEOUS;  Surgeon: Garnett Farm, MD;  Location: [facility];  Service: Urology;  Laterality: Left;  . Tubal ligation    . Laparotomy  07/31/2012    Procedure: EXPLORATORY LAPAROTOMY;  Surgeon: Shelly Rubenstein, MD;  Location: [facility] OR;  Service: General;  Laterality: N/A;  REPAIR OF PANCREATIC INJURY, EXPLORATION OF RETROPERITONEUM.  [name] Colostomy  07/31/2012    Procedure: COLOSTOMY;  Surgeon: Shelly Rubenstein, MD;  Location[facility];  Service: General;  Laterality: Right;  . Colostomy reversal    . Gws  2013    ABDOMINAL SURGERY  . Colostomy closure N/A 02/15/2013    Procedure: COLOSTOMY CLOSURE;  Surgeon: Cherylynn Ridges, MD;  Location: [facility] OR;  Service: General;  Laterality: N/A;  Reversal of colostomy    Family History  Problem Relation Age of Onset  . Cancer Mother     kidney   Social History:  reports that she has never smoked. She has never used smokeless tobacco. She reports that she does not drink alcohol or use illicit drugs.  Allergies:  Allergies  Allergen Reactions  .  Augmentin (Amoxicillin-Pot Clavulanate) Itching, Swelling and Rash  . Avelox (Moxifloxacin Hcl In Nacl) Itching, Swelling and Rash  . Sodium Hydroxide Rash     (Not in a hospital admission)  No results found for this or any previous visit (from the past 48 hour(s)). No results found.  Review of Systems  Constitutional: Positive for fever. Negative for chills.  HENT: Negative.   Eyes: Negative.   Respiratory: Negative.   Cardiovascular: Negative.   Gastrointestinal: Negative.   Musculoskeletal: Negative.   Skin: Negative.   Neurological: Negative.     Blood pressure 92/62, pulse 76, temperature 97.3 F (36.3 C), temperature source Temporal, resp. rate 18, height 5\' 4"  (1.626 m), weight 162 lb (73.483 kg), last menstrual period 01/22/2013. Physical Exam  Constitutional: She is oriented to person, place, and time. She appears well-nourished.  HENT:  Head: Normocephalic and atraumatic.  Eyes: Conjunctivae and EOM are normal. Pupils are equal, round, and reactive to light.  Neck: Normal range of motion. Neck supple.  Cardiovascular: Normal rate and regular rhythm.   Respiratory: Effort normal and breath sounds normal.  GI: Soft. Bowel sounds are normal.    Musculoskeletal: Normal range of motion.  Neurological: She is alert and oriented to person, place, and time.     Assessment/Plan  A 42 year old female with postop infection of her midline wound. 1. IV antibiotics 2. N.p.o., IV fluids 3. CT of abdomen and pelvis  Marigene Ehlers., [facility] 02/27/2013, 2:41 PM

## 2013-02-28 ENCOUNTER — Encounter (INDEPENDENT_AMBULATORY_CARE_PROVIDER_SITE_OTHER): Payer: Medicare Other

## 2013-02-28 LAB — BASIC METABOLIC PANEL
BUN: 6 mg/dL (ref 6–23)
CO2: 27 mEq/L (ref 19–32)
Chloride: 105 mEq/L (ref 96–112)
Creatinine, Ser: 0.97 mg/dL (ref 0.50–1.10)

## 2013-02-28 LAB — CBC
HCT: 27.1 % — ABNORMAL LOW (ref 36.0–46.0)
MCHC: 31.4 g/dL (ref 30.0–36.0)
MCV: 76.6 fL — ABNORMAL LOW (ref 78.0–100.0)
RDW: 15.1 % (ref 11.5–15.5)
WBC: 5.9 10*3/uL (ref 4.0–10.5)

## 2013-02-28 NOTE — Progress Notes (Signed)
Examined patient's wound with dressing out.  Necrotic fascia at the base was debrided.  Two Novafil sutures removed (they were loose and not holding).  Wet to dry dressing reapplied by the nurse.  Patient tolerated the procedure well.  Suspect that no further sharp debridement will be necessary, but I do not believe that the wound is ready for VAC dressing yet.  Marta Lamas. Gae Bon, MD, FACS [phone] Trauma Surgeon
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
## 2013-03-01 MED ORDER — HYDROMORPHONE HCL PF 1 MG/ML IJ SOLN
INTRAMUSCULAR | Status: AC
  Filled 2013-03-01: qty 1

## 2013-03-01 MED ORDER — HYDROCODONE-ACETAMINOPHEN 5-325 MG PO TABS
0.5000 | ORAL_TABLET | ORAL | Status: DC | PRN
  Administered 2013-03-01 – 2013-03-02 (×3): 2 via ORAL
  Filled 2013-03-01 (×3): qty 2

## 2013-03-01 MED ORDER — HYDROMORPHONE HCL PF 1 MG/ML IJ SOLN
0.5000 mg | INTRAMUSCULAR | Status: DC | PRN
  Administered 2013-03-01 – 2013-03-02 (×3): 0.5 mg via INTRAVENOUS
  Filled 2013-03-01 (×2): qty 1

## 2013-03-01 NOTE — Progress Notes (Signed)
Patient ID: Irving Burton, female   DOB: 03-31-1971, 42 y.o.   MRN: [id number]   LOS: 2 days  POD#14  Subjective: Wound feels more full, like there's too much packing in it but otherwise no change. Pain controlled.  Objective: Vital signs in last 24 hours: Temp:  [98.4 F (36.9 C)-98.6 F (37 C)] 98.4 F (36.9 C) (03/06 0612) Pulse Rate:  [57-86] 65 (03/06 0612) Resp:  [17-18] 17 (03/06 0612) BP: (92-113)/(49-68) 103/55 mmHg (03/06 0612) SpO2:  [96 %-100 %] 100 % (03/06 0612) Last BM Date: 02/20/13   Physical Exam General appearance: alert and no distress GI: Soft, wound pink with mild amount of fibrinic material on bed, no odor or drainage noted. Remaining staples removed.   Assessment/Plan: Surgical site infection -- WBC has been WNL, afebrile since admission. Culture no growth to date. Will continue cipro/flagyl/vanc IV and wet-to-dry. Wound might be ready today for [facility] but I think will certainly be ready tomorrow. Will plan to place [name] tomorrow and d/c on oral cipro/flagyl if stable. Depression -- Home meds FEN -- SL IV VTE -- SCD's Dispo -- Likely home tomorrow   Freeman Caldron, PA-C Pager: [phone] General Trauma PA Pager: [phone]   03/01/2013

## 2013-03-01 NOTE — Progress Notes (Signed)
Patient examined and I agree with the assessment and plan CXs still P, wound cleaning up [name], MD, MPH, FACS Pager: [phone]  03/01/2013 10:31 AM

## 2013-03-02 MED ORDER — ONDANSETRON HCL 4 MG PO TABS: 4.0000 mg | ORAL_TABLET | Freq: Three times a day (TID) | ORAL | Status: DC | PRN

## 2013-03-02 MED ORDER — RISPERIDONE 3 MG PO TABS: 3.0000 mg | ORAL_TABLET | Freq: Every day | ORAL | Status: DC

## 2013-03-02 NOTE — Progress Notes (Signed)
Patient is okay to go home with [employer] VAC dressing.  Will come back one week to trauma clinic, and then me in 3 weeks.  This patient has been seen and I agree with the findings and treatment plan.  Marta Lamas. Gae Bon, MD, FACS [phone] (pager) [phone] (direct pager) Trauma Surgeon

## 2013-03-02 NOTE — Progress Notes (Signed)
Patient ID: [name] [name], female   DOB: [birth year], 42 y.o.   MRN: [id number]   LOS: 3 days   Subjective: No change.  Objective: Vital signs in last 24 hours: Temp:  [97.6 F (36.4 C)-98.6 F (37 C)] 98.2 F (36.8 C) (03/07 5784) Pulse Rate:  [67-80] 79 (03/07 0608) Resp:  [18] 18 (03/07 0608) BP: (99-110)/(49-55) 99/49 mmHg (03/07 0608) SpO2:  [96 %-99 %] 99 % (03/07 0608) Last BM Date: 02/20/13   Wound culture: Few enterococcus (sensitivities pending)   Physical Exam General appearance: alert and no distress Resp: clear to auscultation bilaterally Cardio: regular rate and rhythm GI: Soft, +BS, wound clean, VAC placed      Assessment/Plan: Surgical site infection  Depression -- Home meds  Dispo -- home today    Freeman Caldron, PA-C Pager: [phone] General Trauma PA Pager: [phone]   03/02/2013

## 2013-03-02 NOTE — Discharge Summary (Signed)
Physician Discharge Summary  Patient ID: [name] [name] MRN: [id number] DOB/AGE: Mar 02, 1971 42 y.o.  Admit date: 02/27/2013 Discharge date: 03/02/2013  Discharge Diagnoses Patient Active Problem List   Diagnosis Date Noted  . Postoperative wound infection 02/23/2013  . History of colostomy 02/16/2013  . Borderline personality disorder 08/16/2012    Class: Chronic  . Acute blood loss anemia 08/01/2012  . Depression, major 08/01/2012  . Renal calculus, left 03/31/2012  . Hydronephrosis, left 03/31/2012    Consultants None  Procedures None  HPI: [name] [name] is status post ostomy takedown by Dr. Lindie Spruce on 02/15/2013. The patient was seen in urgent clinic and was started on by mouth antibiotics secondary to abdominal wound cellulitis. Since that visit she has been feeling worse with fevers, decreased PO intake secondary to nausea and vomiting, and drainage coming from the wound. Staples were removed and frank pus was encountered. She was directly admitted to the trauma service to manage her wound infection.   Hospital Course: She was started on IV cipro, flagyl, and vancomycin. Her fever curve improved quickly. Her wound was managed with wet-to-dry dressings until it appeared healthy. A VAC dressing was placed at that point and the patient was able to be discharged home in improved condition. A wound culture is partially resulted at the time of discharge and shows Enterococcus species. We will follow up to see if we can narrow her antibiotic coverage.      Medication List    TAKE these medications       ciprofloxacin 500 MG tablet  Commonly known as:  CIPRO  Take 500 mg by mouth 2 (two) times daily. Started last Friday, 02-31-14     DSS 100 MG Caps  Take 100 mg by mouth 2 (two) times daily.     HYDROcodone-acetaminophen 10-325 MG per tablet  Commonly known as:  NORCO  Take 0.5-2 tablets by mouth every 6 (six) hours as needed (1/2 tablet for mild pain, 1 tablet for moderate pain, 2  tablets for severe pain).     hydrOXYzine 25 MG capsule  Commonly known as:  VISTARIL  Take 25 mg by mouth 2 (two) times daily.     metroNIDAZOLE 500 MG tablet  Commonly known as:  FLAGYL  Take 500 mg by mouth 3 (three) times daily. Started last Friday 02-31-14     multivitamin with minerals Tabs  Take 1 tablet by mouth daily.     ondansetron 4 MG tablet  Commonly known as:  ZOFRAN  Take 1 tablet (4 mg total) by mouth every 8 (eight) hours as needed for nausea.     risperiDONE 3 MG tablet  Commonly known as:  RISPERDAL  Take 1 tablet (3 mg total) by mouth at bedtime.     traMADol 50 MG tablet  Commonly known as:  ULTRAM  Take 2 tablets (100 mg total) by mouth every 6 (six) hours as needed for pain.     traZODone 100 MG tablet  Commonly known as:  DESYREL  Take 300 mg by mouth at bedtime.         Follow-up Information   Follow up with Cherylynn Ridges, MD On 03/13/2013.   Contact information:   [address] [place], [place] [address] [phone]       Signed: Freeman Caldron, PA-C Pager: [phone] General Trauma PA Pager: [phone]  03/02/2013, 11:17 AM

## 2013-03-02 NOTE — Progress Notes (Signed)
Patient discharged to home with husband.  Discharge instructions completed including follow up care, wound care, medications and activity.  Verbalizes understanding with no further questions.  Wound Vac in place with patient's home wound vac on.  Home health to see patient on Monday for vac change.  Patient understands and demonstrates use of wound vac.  Vital signs stable no complaints of nausea.  Discharge per wheelchair with husband.

## 2013-03-03 LAB — WOUND CULTURE: Gram Stain: NONE SEEN

## 2013-03-04 ENCOUNTER — Other Ambulatory Visit (INDEPENDENT_AMBULATORY_CARE_PROVIDER_SITE_OTHER): Payer: Self-pay | Admitting: General Surgery

## 2013-03-06 ENCOUNTER — Telehealth (HOSPITAL_COMMUNITY): Payer: Self-pay | Admitting: Emergency Medicine

## 2013-03-06 MED ORDER — TRAMADOL HCL 50 MG PO TABS: 100.0000 mg | ORAL_TABLET | Freq: Four times a day (QID) | ORAL | Status: DC | PRN

## 2013-03-06 NOTE — Telephone Encounter (Signed)
Refilled rx for tramadol.

## 2013-03-13 ENCOUNTER — Ambulatory Visit (INDEPENDENT_AMBULATORY_CARE_PROVIDER_SITE_OTHER): Payer: 59 | Admitting: General Surgery

## 2013-03-13 ENCOUNTER — Encounter (INDEPENDENT_AMBULATORY_CARE_PROVIDER_SITE_OTHER): Payer: Self-pay | Admitting: General Surgery

## 2013-03-13 VITALS — BP 104/66 | HR 88 | Resp 14 | Ht 64.0 in | Wt 164.2 lb

## 2013-03-13 DIAGNOSIS — Z09 Encounter for follow-up examination after completed treatment for conditions other than malignant neoplasm: Secondary | ICD-10-CM

## 2013-03-13 NOTE — Progress Notes (Signed)
The patient comes in today doing much better than she had been before. I removed her negative pressure wound dressing. He demonstrates a wound measuring approximately 4 x 2 x 1 cm deep. At the deepest portion of the wound there were several pieces of loose suture material which I removed with scissors and a hemostat clamp. It is very little difficulty in doing so. It was no bleeding.  I subsequently used a wet to dry saline soaked 4 x 4 gauze dressing for her continued dressings. I believe that she can go to wet-to-dry dressing once a day push hours prior to each dressing change. She does not need the negative pressure wound dressing anymore.  I will see her back in 2 weeks.

## 2013-03-14 ENCOUNTER — Ambulatory Visit (HOSPITAL_COMMUNITY): Payer: Self-pay | Admitting: Psychiatry

## 2013-03-19 ENCOUNTER — Ambulatory Visit (INDEPENDENT_AMBULATORY_CARE_PROVIDER_SITE_OTHER): Payer: 59 | Admitting: General Surgery

## 2013-03-19 ENCOUNTER — Encounter (INDEPENDENT_AMBULATORY_CARE_PROVIDER_SITE_OTHER): Payer: Self-pay | Admitting: General Surgery

## 2013-03-19 VITALS — BP 110/70 | HR 84 | Temp 97.9°F | Resp 14 | Ht 64.0 in | Wt 165.4 lb

## 2013-03-19 DIAGNOSIS — Z09 Encounter for follow-up examination after completed treatment for conditions other than malignant neoplasm: Secondary | ICD-10-CM

## 2013-03-19 NOTE — Patient Instructions (Signed)
Continue current wound care Follow up with Dr Lindie Spruce as scheduled

## 2013-03-19 NOTE — Progress Notes (Signed)
Subjective:     Patient ID: [name] [name], female   DOB: 1971-05-09, 42 y.o.   MRN: [id number]  HPI 42 year old female status post colostomy reversal on February 20 by Dr. Lindie Spruce who developed a postoperative wound infection. She has been doing wet to dry dressing. She was concerned because she noticed some green drainage from her wound. She denies any fever, chills, nausea, vomiting, diarrhea, constipation or abdominal pain.  Review of Systems     Objective:   Physical Exam BP 110/70  Pulse 84  Temp(Src) 97.9 F (36.6 C)  Resp 14  Ht 5\' 4"  (1.626 m)  Wt 165 lb 6.4 oz (75.025 kg)  BMI 28.38 kg/m2  LMP 02/21/2013 Alert, no apparent distress Abdomen soft, nontender, nondistended; open small midline incision healing by secondary intention-link the wound is about 4 cm long by 2 cm wide  By 1-1/2 cm deep. Fascia intact. I cannot express any drainage. No cellulitis, induration or fluctuance. Great granulation tissue    Assessment:     Postoperative wound     Plan:     The wound looks good to me. There is no sign of current infection. The patient was instructed to continue wet-to-dry dressing. Followup as directed with Dr. Carilyn Goodpasture. Andrey Campanile, MD, FACS General, Bariatric, & Minimally Invasive Surgery [facility], [place]

## 2013-03-27 DIAGNOSIS — D539 Nutritional anemia, unspecified: Secondary | ICD-10-CM | POA: Diagnosis not present

## 2013-03-27 DIAGNOSIS — R443 Hallucinations, unspecified: Secondary | ICD-10-CM | POA: Diagnosis not present

## 2013-03-27 DIAGNOSIS — F209 Schizophrenia, unspecified: Secondary | ICD-10-CM | POA: Diagnosis not present

## 2013-03-29 ENCOUNTER — Encounter (INDEPENDENT_AMBULATORY_CARE_PROVIDER_SITE_OTHER): Payer: Self-pay | Admitting: General Surgery

## 2013-03-29 ENCOUNTER — Ambulatory Visit (INDEPENDENT_AMBULATORY_CARE_PROVIDER_SITE_OTHER): Payer: 59 | Admitting: General Surgery

## 2013-03-29 VITALS — BP 138/62 | HR 78 | Temp 98.2°F | Resp 18 | Ht 64.0 in | Wt 164.0 lb

## 2013-03-29 DIAGNOSIS — Z09 Encounter for follow-up examination after completed treatment for conditions other than malignant neoplasm: Secondary | ICD-10-CM

## 2013-03-29 NOTE — Progress Notes (Signed)
I'm happy to report that the patient is doing very well. Her incision now measures 2 x 1 cm in size and is not very deep at all. There is excellent granulation tissue on the surface. A small amount of exudate. She can shower, pat her wound dry, and then placed a small layer or thin layer of triple antibiotic ointment on the wound once a day.  I will see the patient back in approximately 6 weeks for her final recheck. She was very appreciative of her care.

## 2013-03-30 ENCOUNTER — Encounter (HOSPITAL_COMMUNITY): Payer: Self-pay | Admitting: Emergency Medicine

## 2013-03-30 ENCOUNTER — Emergency Department (HOSPITAL_COMMUNITY)
Admission: EM | Admit: 2013-03-30 | Discharge: 2013-03-31 | Disposition: A | Discharge status: Home / Self Care | Payer: 59 | Attending: Emergency Medicine | Admitting: Emergency Medicine

## 2013-03-30 DIAGNOSIS — F329 Major depressive disorder, single episode, unspecified: Secondary | ICD-10-CM | POA: Insufficient documentation

## 2013-03-30 DIAGNOSIS — N189 Chronic kidney disease, unspecified: Secondary | ICD-10-CM | POA: Insufficient documentation

## 2013-03-30 DIAGNOSIS — Z87448 Personal history of other diseases of urinary system: Secondary | ICD-10-CM | POA: Insufficient documentation

## 2013-03-30 DIAGNOSIS — F411 Generalized anxiety disorder: Secondary | ICD-10-CM | POA: Insufficient documentation

## 2013-03-30 DIAGNOSIS — R443 Hallucinations, unspecified: Secondary | ICD-10-CM | POA: Insufficient documentation

## 2013-03-30 DIAGNOSIS — Z3202 Encounter for pregnancy test, result negative: Secondary | ICD-10-CM | POA: Insufficient documentation

## 2013-03-30 DIAGNOSIS — F3289 Other specified depressive episodes: Secondary | ICD-10-CM | POA: Insufficient documentation

## 2013-03-30 DIAGNOSIS — F419 Anxiety disorder, unspecified: Secondary | ICD-10-CM

## 2013-03-30 DIAGNOSIS — Z79899 Other long term (current) drug therapy: Secondary | ICD-10-CM | POA: Insufficient documentation

## 2013-03-30 DIAGNOSIS — Z862 Personal history of diseases of the blood and blood-forming organs and certain disorders involving the immune mechanism: Secondary | ICD-10-CM | POA: Insufficient documentation

## 2013-03-30 DIAGNOSIS — R011 Cardiac murmur, unspecified: Secondary | ICD-10-CM | POA: Insufficient documentation

## 2013-03-30 LAB — RAPID URINE DRUG SCREEN, HOSP PERFORMED
Barbiturates: NOT DETECTED
Benzodiazepines: NOT DETECTED
Cocaine: NOT DETECTED
Opiates: NOT DETECTED
Tetrahydrocannabinol: NOT DETECTED

## 2013-03-30 LAB — CBC
HCT: 34.4 % — ABNORMAL LOW (ref 36.0–46.0)
MCH: 24.9 pg — ABNORMAL LOW (ref 26.0–34.0)
MCV: 80 fL (ref 78.0–100.0)
Platelets: 343 10*3/uL (ref 150–400)
RDW: 17.6 % — ABNORMAL HIGH (ref 11.5–15.5)

## 2013-03-30 LAB — ETHANOL: Alcohol, Ethyl (B): <11 mg/dL (ref 0–11)

## 2013-03-30 LAB — COMPREHENSIVE METABOLIC PANEL
AST: 12 U/L (ref 0–37)
Albumin: 3.6 g/dL (ref 3.5–5.2)
BUN: 10 mg/dL (ref 6–23)
Calcium: 9.3 mg/dL (ref 8.4–10.5)
Creatinine, Ser: 1.01 mg/dL (ref 0.50–1.10)
GFR calc non Af Amer: 68 mL/min — ABNORMAL LOW (ref >90)

## 2013-03-30 MED ORDER — CLONAZEPAM 0.5 MG PO TABS: 0.5000 mg | ORAL_TABLET | Freq: Two times a day (BID) | ORAL | Status: DC | PRN

## 2013-03-30 MED ORDER — LURASIDONE HCL 40 MG PO TABS
40.0000 mg | ORAL_TABLET | Freq: Every day | ORAL | Status: DC
  Administered 2013-03-31: 40 mg via ORAL
  Filled 2013-03-30 (×2): qty 1

## 2013-03-30 MED ORDER — ZOLPIDEM TARTRATE 5 MG PO TABS
5.0000 mg | ORAL_TABLET | Freq: Every evening | ORAL | Status: DC | PRN
  Administered 2013-03-30: 5 mg via ORAL
  Filled 2013-03-30: qty 1

## 2013-03-30 MED ORDER — NICOTINE 21 MG/24HR TD PT24
21.0000 mg | MEDICATED_PATCH | Freq: Every day | TRANSDERMAL | Status: DC
  Filled 2013-03-30: qty 1

## 2013-03-30 MED ORDER — LORAZEPAM 1 MG PO TABS: 1.0000 mg | ORAL_TABLET | Freq: Three times a day (TID) | ORAL | Status: DC | PRN

## 2013-03-30 MED ORDER — ADULT MULTIVITAMIN W/MINERALS CH
1.0000 | ORAL_TABLET | Freq: Every day | ORAL | Status: DC
  Administered 2013-03-30 – 2013-03-31 (×2): 1 via ORAL
  Filled 2013-03-30 (×2): qty 1

## 2013-03-30 MED ORDER — IBUPROFEN 600 MG PO TABS: 600.0000 mg | ORAL_TABLET | Freq: Three times a day (TID) | ORAL | Status: DC | PRN

## 2013-03-30 MED ORDER — ONDANSETRON HCL 4 MG PO TABS: 4.0000 mg | ORAL_TABLET | Freq: Three times a day (TID) | ORAL | Status: DC | PRN

## 2013-03-30 MED ORDER — ALUM & MAG HYDROXIDE-SIMETH 200-200-20 MG/5ML PO SUSP: 30.0000 mL | ORAL | Status: DC | PRN

## 2013-03-30 MED ORDER — VENLAFAXINE HCL ER 150 MG PO CP24
150.0000 mg | ORAL_CAPSULE | Freq: Every day | ORAL | Status: DC
  Filled 2013-03-30 (×2): qty 1

## 2013-03-30 MED ORDER — ACETAMINOPHEN 325 MG PO TABS: 650.0000 mg | ORAL_TABLET | ORAL | Status: DC | PRN
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
## 2013-03-30 NOTE — ED Provider Notes (Signed)
Medical screening examination/treatment/procedure(s) were performed by non-physician practitioner and as supervising physician I was immediately available for consultation/collaboration.    Naudia Crosley D Ojani Berenson, MD 03/30/13 2330 
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
## 2013-03-30 NOTE — ED Notes (Signed)
Pt has one bag of belongings.  Pt seen and wand by security.

## 2013-03-31 MED ORDER — CLONAZEPAM 0.5 MG PO TABS: 0.5000 mg | ORAL_TABLET | Freq: Two times a day (BID) | ORAL | Status: DC | PRN

## 2013-03-31 MED ORDER — ZOLPIDEM TARTRATE 5 MG PO TABS: 5.0000 mg | ORAL_TABLET | Freq: Every evening | ORAL | Status: DC | PRN

## 2013-03-31 MED ORDER — DESVENLAFAXINE SUCCINATE ER 100 MG PO TB24: 100.0000 mg | ORAL_TABLET | Freq: Every day | ORAL | Status: DC

## 2013-03-31 NOTE — ED Notes (Signed)
telepsych in progress 
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
## 2013-03-31 NOTE — ED Provider Notes (Addendum)
Pt resting, nad. Discussed w act team, awaiting placement.   Suzi Roots, MD 03/31/13 [phone]   Psychiatry,  Dr Janalyn Rouse, has evaluated patient.  He states pt psychiatrically stable for d/c. He recommends giving rx for pristiq, ambien and klonopin, in addition to rx for risperdal.   Recheck pt, normal mood and affect. Pleasant, cooperative, conversant, smiling. Pt optimistic about d/c home, states feels ready for d/c. Pt states she feels the rispderdal, ambien, pristiq, klonopin rx plan is a good one, states has done well on those meds in past. States has follow up with [facility] 2-3 x/week and pcp f/u with Dr Clyde Canterbury.  Pt is having no hallucinations.  Thought processes clear, no delusions. Pt appears stable for d/c.   Suzi Roots, MD 03/31/13 1435

## 2013-03-31 NOTE — ED Notes (Signed)
Up to the desk on the phone 

## 2013-03-31 NOTE — ED Notes (Signed)
Up in the hall walking, waiting for results from telepsych

## 2013-03-31 NOTE — ED Notes (Signed)
Dr steinl into see 
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
## 2013-03-31 NOTE — ED Notes (Signed)
Pt dc'd w/ written instructions and rx x3.  Pt verbalized understanding of instructions and was encouraged to return for any return of suicidal thoughts,  Pt reported she would.  Belongings returned after dc and leaving the unit.  Pt's son is here to pick her up.

## 2013-04-02 DIAGNOSIS — IMO0002 Reserved for concepts with insufficient information to code with codable children: Secondary | ICD-10-CM | POA: Diagnosis not present

## 2013-04-02 DIAGNOSIS — F259 Schizoaffective disorder, unspecified: Secondary | ICD-10-CM | POA: Diagnosis not present

## 2013-04-02 DIAGNOSIS — Z9119 Patient's noncompliance with other medical treatment and regimen: Secondary | ICD-10-CM | POA: Diagnosis not present

## 2013-04-02 DIAGNOSIS — F609 Personality disorder, unspecified: Secondary | ICD-10-CM | POA: Diagnosis not present

## 2013-04-02 DIAGNOSIS — R51 Headache: Secondary | ICD-10-CM | POA: Diagnosis not present

## 2013-04-02 DIAGNOSIS — R4182 Altered mental status, unspecified: Secondary | ICD-10-CM | POA: Diagnosis not present

## 2013-04-02 DIAGNOSIS — I498 Other specified cardiac arrhythmias: Secondary | ICD-10-CM | POA: Diagnosis not present

## 2013-04-02 DIAGNOSIS — F200 Paranoid schizophrenia: Secondary | ICD-10-CM | POA: Diagnosis not present

## 2013-04-02 DIAGNOSIS — I10 Essential (primary) hypertension: Secondary | ICD-10-CM | POA: Diagnosis not present

## 2013-04-02 DIAGNOSIS — F411 Generalized anxiety disorder: Secondary | ICD-10-CM | POA: Diagnosis not present

## 2013-04-02 DIAGNOSIS — R45851 Suicidal ideations: Secondary | ICD-10-CM | POA: Diagnosis not present

## 2013-04-02 DIAGNOSIS — F5000 Anorexia nervosa, unspecified: Secondary | ICD-10-CM | POA: Diagnosis present

## 2013-04-02 DIAGNOSIS — F2081 Schizophreniform disorder: Secondary | ICD-10-CM | POA: Diagnosis not present

## 2013-04-03 DIAGNOSIS — Z8614 Personal history of Methicillin resistant Staphylococcus aureus infection: Secondary | ICD-10-CM | POA: Insufficient documentation

## 2013-04-03 DIAGNOSIS — R51 Headache: Secondary | ICD-10-CM

## 2013-05-01 ENCOUNTER — Ambulatory Visit (INDEPENDENT_AMBULATORY_CARE_PROVIDER_SITE_OTHER): Payer: 59 | Admitting: General Surgery

## 2013-05-01 ENCOUNTER — Encounter (INDEPENDENT_AMBULATORY_CARE_PROVIDER_SITE_OTHER): Payer: Self-pay | Admitting: General Surgery

## 2013-05-01 VITALS — BP 120/84 | HR 78 | Temp 97.8°F | Resp 14 | Ht 64.0 in | Wt 165.2 lb

## 2013-05-01 DIAGNOSIS — Z560 Unemployment, unspecified: Secondary | ICD-10-CM | POA: Diagnosis not present

## 2013-05-01 DIAGNOSIS — D649 Anemia, unspecified: Secondary | ICD-10-CM | POA: Diagnosis present

## 2013-05-01 DIAGNOSIS — F259 Schizoaffective disorder, unspecified: Secondary | ICD-10-CM | POA: Diagnosis not present

## 2013-05-01 DIAGNOSIS — I10 Essential (primary) hypertension: Secondary | ICD-10-CM | POA: Diagnosis present

## 2013-05-01 DIAGNOSIS — K117 Disturbances of salivary secretion: Secondary | ICD-10-CM | POA: Diagnosis present

## 2013-05-01 DIAGNOSIS — R45851 Suicidal ideations: Secondary | ICD-10-CM | POA: Diagnosis not present

## 2013-05-01 DIAGNOSIS — F411 Generalized anxiety disorder: Secondary | ICD-10-CM | POA: Diagnosis not present

## 2013-05-01 DIAGNOSIS — Z880 Allergy status to penicillin: Secondary | ICD-10-CM | POA: Diagnosis not present

## 2013-05-01 DIAGNOSIS — Z85858 Personal history of malignant neoplasm of other endocrine glands: Secondary | ICD-10-CM | POA: Diagnosis not present

## 2013-05-01 DIAGNOSIS — Z09 Encounter for follow-up examination after completed treatment for conditions other than malignant neoplasm: Secondary | ICD-10-CM

## 2013-05-01 DIAGNOSIS — Z888 Allergy status to other drugs, medicaments and biological substances status: Secondary | ICD-10-CM | POA: Diagnosis not present

## 2013-05-01 DIAGNOSIS — Z9889 Other specified postprocedural states: Secondary | ICD-10-CM | POA: Diagnosis not present

## 2013-05-01 DIAGNOSIS — F29 Unspecified psychosis not due to a substance or known physiological condition: Secondary | ICD-10-CM | POA: Diagnosis present

## 2013-05-01 DIAGNOSIS — I498 Other specified cardiac arrhythmias: Secondary | ICD-10-CM | POA: Diagnosis present

## 2013-05-01 DIAGNOSIS — H538 Other visual disturbances: Secondary | ICD-10-CM | POA: Diagnosis present

## 2013-05-01 DIAGNOSIS — F603 Borderline personality disorder: Secondary | ICD-10-CM | POA: Diagnosis present

## 2013-05-01 DIAGNOSIS — F319 Bipolar disorder, unspecified: Secondary | ICD-10-CM | POA: Diagnosis present
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
## 2013-05-07 DIAGNOSIS — T887XXA Unspecified adverse effect of drug or medicament, initial encounter: Secondary | ICD-10-CM | POA: Insufficient documentation

## 2013-05-28 ENCOUNTER — Ambulatory Visit (INDEPENDENT_AMBULATORY_CARE_PROVIDER_SITE_OTHER): Payer: 59 | Admitting: General Surgery

## 2013-05-28 ENCOUNTER — Telehealth (INDEPENDENT_AMBULATORY_CARE_PROVIDER_SITE_OTHER): Payer: Self-pay | Admitting: General Surgery

## 2013-05-28 ENCOUNTER — Encounter (INDEPENDENT_AMBULATORY_CARE_PROVIDER_SITE_OTHER): Payer: Self-pay | Admitting: General Surgery

## 2013-05-28 VITALS — BP 124/84 | HR 114 | Temp 98.2°F | Resp 18 | Ht 64.0 in | Wt 171.4 lb

## 2013-05-28 DIAGNOSIS — K769 Liver disease, unspecified: Secondary | ICD-10-CM

## 2013-05-28 DIAGNOSIS — R160 Hepatomegaly, not elsewhere classified: Secondary | ICD-10-CM

## 2013-05-28 NOTE — Telephone Encounter (Signed)
Patient is aware of appt on 06/02/13 at 1:15 pm  [address] MRI abd
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
## 2013-05-28 NOTE — Assessment & Plan Note (Signed)
We will repeat an MRI to see if this small lesion has changed at all in the last year.  This is most likely benign. We will likely not need to do any future imaging. However if it looks unchanged or slightly larger I would prefer a two-year followup.  I have spoken to Dr. Lindie Spruce and this was not palpable or visible at operation.  We will determine her followup once we get her MRI result.

## 2013-05-28 NOTE — Patient Instructions (Signed)
Get repeat MRI.  We will determine what to do about biopsy vs serial imaging once we get result of MRI.

## 2013-06-02 ENCOUNTER — Ambulatory Visit
Admission: RE | Admit: 2013-06-02 | Discharge: 2013-06-02 | Disposition: A | Discharge status: Home / Self Care | Payer: 59 | Source: Ambulatory Visit | Attending: General Surgery | Admitting: General Surgery

## 2013-06-02 DIAGNOSIS — F1020 Alcohol dependence, uncomplicated: Secondary | ICD-10-CM | POA: Insufficient documentation

## 2013-06-02 DIAGNOSIS — R160 Hepatomegaly, not elsewhere classified: Secondary | ICD-10-CM

## 2013-06-02 MED ORDER — GADOBENATE DIMEGLUMINE 529 MG/ML IV SOLN
16.0000 mL | Freq: Once | INTRAVENOUS | Status: AC | PRN
  Administered 2013-06-02: 16 mL via INTRAVENOUS

## 2013-06-03 NOTE — Progress Notes (Signed)
Quick Note:  Please let patient know MRI showed that there is not a true mass in the liver, everything looks benign on MR, no need for follow up MRI. ______

## 2013-06-04 ENCOUNTER — Telehealth (INDEPENDENT_AMBULATORY_CARE_PROVIDER_SITE_OTHER): Payer: Self-pay

## 2013-06-04 NOTE — Telephone Encounter (Signed)
Pt notified of benign MRI results and no need for f/u MRI.

## 2013-06-05 ENCOUNTER — Encounter (INDEPENDENT_AMBULATORY_CARE_PROVIDER_SITE_OTHER): Payer: Self-pay

## 2013-06-28 DIAGNOSIS — Z01419 Encounter for gynecological examination (general) (routine) without abnormal findings: Secondary | ICD-10-CM | POA: Diagnosis not present

## 2013-06-28 DIAGNOSIS — Z1212 Encounter for screening for malignant neoplasm of rectum: Secondary | ICD-10-CM | POA: Diagnosis not present

## 2013-07-12 DIAGNOSIS — Z1231 Encounter for screening mammogram for malignant neoplasm of breast: Secondary | ICD-10-CM | POA: Diagnosis not present

## 2013-08-04 DIAGNOSIS — S93409A Sprain of unspecified ligament of unspecified ankle, initial encounter: Secondary | ICD-10-CM | POA: Diagnosis not present

## 2013-08-10 ENCOUNTER — Telehealth: Payer: Self-pay | Admitting: Hematology and Oncology

## 2013-08-10 NOTE — Telephone Encounter (Signed)
PT RETURN CALL IN RE TO NP APPT 09/04 @ 3 W/VICTOR REFERRING DR. SHARON WOLTER DX-ANEMIA WELCOME PACKET MAILED.

## 2013-08-10 NOTE — Telephone Encounter (Signed)
LVOM FOR PT TO RETURN CALL IN RE TO REFERRAL.  °

## 2013-08-13 ENCOUNTER — Telehealth: Payer: Self-pay

## 2013-08-13 NOTE — Telephone Encounter (Signed)
C/D 08/13/13 for appt. 08/30/13

## 2013-08-22 ENCOUNTER — Emergency Department (HOSPITAL_COMMUNITY): Payer: 59

## 2013-08-22 ENCOUNTER — Encounter (HOSPITAL_COMMUNITY): Payer: Self-pay | Admitting: *Deleted

## 2013-08-22 ENCOUNTER — Emergency Department (HOSPITAL_COMMUNITY)
Admission: EM | Admit: 2013-08-22 | Discharge: 2013-08-22 | Disposition: A | Discharge status: Home / Self Care | Payer: 59 | Attending: Emergency Medicine | Admitting: Emergency Medicine

## 2013-08-22 DIAGNOSIS — Z862 Personal history of diseases of the blood and blood-forming organs and certain disorders involving the immune mechanism: Secondary | ICD-10-CM | POA: Insufficient documentation

## 2013-08-22 DIAGNOSIS — R0609 Other forms of dyspnea: Secondary | ICD-10-CM | POA: Insufficient documentation

## 2013-08-22 DIAGNOSIS — Z87442 Personal history of urinary calculi: Secondary | ICD-10-CM | POA: Insufficient documentation

## 2013-08-22 DIAGNOSIS — R0600 Dyspnea, unspecified: Secondary | ICD-10-CM

## 2013-08-22 DIAGNOSIS — N189 Chronic kidney disease, unspecified: Secondary | ICD-10-CM | POA: Insufficient documentation

## 2013-08-22 DIAGNOSIS — Z87448 Personal history of other diseases of urinary system: Secondary | ICD-10-CM | POA: Insufficient documentation

## 2013-08-22 DIAGNOSIS — Z79899 Other long term (current) drug therapy: Secondary | ICD-10-CM | POA: Insufficient documentation

## 2013-08-22 DIAGNOSIS — Z8659 Personal history of other mental and behavioral disorders: Secondary | ICD-10-CM | POA: Insufficient documentation

## 2013-08-22 DIAGNOSIS — R011 Cardiac murmur, unspecified: Secondary | ICD-10-CM | POA: Insufficient documentation

## 2013-08-22 DIAGNOSIS — R0989 Other specified symptoms and signs involving the circulatory and respiratory systems: Secondary | ICD-10-CM | POA: Insufficient documentation

## 2013-08-22 LAB — CBC WITH DIFFERENTIAL/PLATELET
HCT: 34.2 % — ABNORMAL LOW (ref 36.0–46.0)
Hemoglobin: 10.5 g/dL — ABNORMAL LOW (ref 12.0–15.0)
Lymphs Abs: 1.4 10*3/uL (ref 0.7–4.0)
Monocytes Absolute: 0.5 10*3/uL (ref 0.1–1.0)
Monocytes Relative: 8 % (ref 3–12)
Neutro Abs: 4.3 10*3/uL (ref 1.7–7.7)
Neutrophils Relative %: 68 % (ref 43–77)
RBC: 4.5 MIL/uL (ref 3.87–5.11)

## 2013-08-22 LAB — BASIC METABOLIC PANEL
BUN: 12 mg/dL (ref 6–23)
CO2: 24 mEq/L (ref 19–32)
Chloride: 103 mEq/L (ref 96–112)
Glucose, Bld: 109 mg/dL — ABNORMAL HIGH (ref 70–99)
Potassium: 3.9 mEq/L (ref 3.5–5.1)

## 2013-08-22 LAB — POCT PREGNANCY, URINE: Preg Test, Ur: NEGATIVE

## 2013-08-22 LAB — URINALYSIS, ROUTINE W REFLEX MICROSCOPIC
Bilirubin Urine: NEGATIVE
Glucose, UA: NEGATIVE mg/dL
Hgb urine dipstick: NEGATIVE
Nitrite: NEGATIVE
Specific Gravity, Urine: 1.021 (ref 1.005–1.030)
pH: 6.5 (ref 5.0–8.0)

## 2013-08-22 MED ORDER — IOHEXOL 350 MG/ML SOLN
100.0000 mL | Freq: Once | INTRAVENOUS | Status: AC | PRN
  Administered 2013-08-22: 100 mL via INTRAVENOUS

## 2013-08-22 MED ORDER — DIPHENHYDRAMINE HCL 50 MG/ML IJ SOLN
25.0000 mg | Freq: Once | INTRAMUSCULAR | Status: AC
  Administered 2013-08-22: 25 mg via INTRAVENOUS
  Filled 2013-08-22: qty 1

## 2013-08-22 NOTE — ED Provider Notes (Signed)
CSN: [id number]     Arrival date & time 08/22/13  1249 History   First MD Initiated Contact with Patient 08/22/13 1348     Chief Complaint  Patient presents with  . Shortness of Breath    at night   (Consider location/radiation/quality/duration/timing/severity/associated sxs/prior Treatment) Patient is a 42 y.o. female presenting with shortness of breath. The history is provided by the patient. No language interpreter was used.  Shortness of Breath Severity:  Moderate Onset quality:  Unable to specify Duration:  1 week Timing:  Intermittent Progression:  Waxing and waning Chronicity:  New Relieved by:  Sitting up Exacerbated by: laying flat. Ineffective treatments:  None tried Associated symptoms: no abdominal pain, no chest pain, no cough, no diaphoresis, no fever, no headaches, no neck pain, no rash, no sore throat, no sputum production, no syncope, no vomiting and no wheezing   Risk factors: hx of PE/DVT and obesity   Risk factors: no oral contraceptive use, no prolonged immobilization and no tobacco use     Past Medical History  Diagnosis Date  . Heart murmur     asa child   . Anemia     hx of   . Chronic kidney disease     kidney stone   . Depression   . Anxiety   . Anemia   . Chronic kidney disease   . Blood dyscrasia   . Renal insufficiency    Past Surgical History  Procedure Laterality Date  . Other surgical history      cyst removed from ovary ? side   . Nephrolithotomy  03/31/2012    Procedure: NEPHROLITHOTOMY PERCUTANEOUS;  Surgeon: Garnett Farm, MD;  Location: [facility];  Service: Urology;  Laterality: Left;  . Tubal ligation    . Laparotomy  07/31/2012    Procedure: EXPLORATORY LAPAROTOMY;  Surgeon: Shelly Rubenstein, MD;  Location: [facility] OR;  Service: General;  Laterality: N/A;  REPAIR OF PANCREATIC INJURY, EXPLORATION OF RETROPERITONEUM.  [name] Colostomy  07/31/2012    Procedure: COLOSTOMY;  Surgeon: Shelly Rubenstein, MD;  Location: [facility] OR;  Service: General;   Laterality: Right;  . Colostomy reversal    . Gws  2013    ABDOMINAL SURGERY  . Colostomy closure N/A 02/15/2013    Procedure: COLOSTOMY CLOSURE;  Surgeon: Cherylynn Ridges, MD;  [facility];  Service: General;  Laterality: N/A;  Reversal of colostomy   Family History  Problem Relation Age of Onset  . Cancer Mother     kidney   History  Substance Use Topics  . Smoking status: Never Smoker   . Smokeless tobacco: Never Used  . Alcohol Use: No   OB History   Grav Para Term Preterm Abortions TAB SAB Ect Mult Living                 Review of Systems  Constitutional: Negative for fever, chills, diaphoresis, activity change, appetite change and fatigue.  HENT: Negative for congestion, sore throat, facial swelling, rhinorrhea, neck pain and neck stiffness.   Eyes: Negative for photophobia and discharge.  Respiratory: Positive for shortness of breath. Negative for cough, sputum production, chest tightness and wheezing.   Cardiovascular: Negative for chest pain, palpitations, leg swelling and syncope.  Gastrointestinal: Negative for nausea, vomiting, abdominal pain and diarrhea.  Endocrine: Negative for polydipsia and polyuria.  Genitourinary: Negative for dysuria, frequency, difficulty urinating and pelvic pain.  Musculoskeletal: Negative for back pain and arthralgias.  Skin: Negative for color change, rash and  wound.  Allergic/Immunologic: Negative for immunocompromised state.  Neurological: Negative for facial asymmetry, weakness, numbness and headaches.  Hematological: Does not bruise/bleed easily.  Psychiatric/Behavioral: Negative for confusion and agitation.    Allergies  Augmentin; Avelox; and Sodium hydroxide  Home Medications   Current Outpatient Rx  Name  Route  Sig  Dispense  Refill  . atropine 1 % ophthalmic solution   Both Eyes   Place 2 drops into both eyes at bedtime.          . cloZAPine (CLOZARIL) 25 MG tablet   Oral   Take 175 mg by mouth at bedtime.          . docusate sodium (COLACE) 100 MG capsule   Oral   Take 100 mg by mouth daily as needed for constipation.         . hydrOXYzine (ATARAX/VISTARIL) 50 MG tablet               . metFORMIN (GLUCOPHAGE) 500 MG tablet   Oral   Take 500 mg by mouth at bedtime.          . Multiple Vitamin (MULTIVITAMIN WITH MINERALS) TABS   Oral   Take 1 tablet by mouth every evening.           BP 128/77  Pulse 91  Temp(Src) 98.4 F (36.9 C) (Oral)  Resp 14  SpO2 100%  LMP 08/20/2013 Physical Exam  Constitutional: She is oriented to person, place, and time. She appears well-developed and well-nourished. No distress.  HENT:  Head: Normocephalic and atraumatic.  Mouth/Throat: No oropharyngeal exudate.  Eyes: Pupils are equal, round, and reactive to light.  Neck: Normal range of motion. Neck supple.  Cardiovascular: Normal rate, regular rhythm and normal heart sounds.  Exam reveals no gallop and no friction rub.   No murmur heard. Pulmonary/Chest: Effort normal and breath sounds normal. No respiratory distress. She has no wheezes. She has no rales.  Abdominal: Soft. Bowel sounds are normal. She exhibits no distension and no mass. There is no tenderness. There is no rebound and no guarding.  Musculoskeletal: Normal range of motion. She exhibits no edema and no tenderness.  Neurological: She is alert and oriented to person, place, and time.  Skin: Skin is warm and dry.  Psychiatric: She has a normal mood and affect.    ED Course  Procedures (including critical care time) Labs Review Labs Reviewed  CBC WITH DIFFERENTIAL - Abnormal; Notable for the following:    Hemoglobin 10.5 (*)    HCT 34.2 (*)    MCV 76.0 (*)    MCH 23.3 (*)    RDW 16.5 (*)    Platelets 401 (*)    All other components within normal limits  BASIC METABOLIC PANEL - Abnormal; Notable for the following:    Glucose, Bld 109 (*)    GFR calc non Af Amer 83 (*)    All other components within normal limits   URINALYSIS, ROUTINE W REFLEX MICROSCOPIC  PRO B NATRIURETIC PEPTIDE  POCT PREGNANCY, URINE   Imaging Review Dg Chest 2 View  08/22/2013   CLINICAL DATA:  Shortness of Breath.  EXAM: CHEST  2 VIEW  COMPARISON:  09/18/2012  FINDINGS: Heart and mediastinal contours are within normal limits. No focal opacities or effusions. No acute bony abnormality.  IMPRESSION: No active cardiopulmonary disease.   Electronically Signed   By: Charlett Nose   On: 08/22/2013 15:32   Ct Angio Chest Pe W/cm &/or Wo Cm  08/22/2013   *RADIOLOGY REPORT*  Clinical Data: Difficulty breathing.  CT ANGIOGRAPHY CHEST  Technique:  Multidetector CT imaging of the chest using the standard protocol during bolus administration of intravenous contrast. Multiplanar reconstructed images including MIPs were obtained and reviewed to evaluate the vascular anatomy.  Contrast: OMNIPAQUE IOHEXOL 350 MG/ML SOLN  Comparison: Chest radiograph 08/22/2013 and CT chest 09/18/2012.  Findings: No pulmonary embolus.  8 mm low attenuation lesion in the right lobe the thyroid is noted. Sub-centimeter thyroid nodule(s) noted, too small to characterize, but most likely benign in the absence of known clinical risk factors for thyroid carcinoma.  No pathologically enlarged mediastinal, hilar or axillary lymph nodes. Heart size within normal limits.  No pericardial effusion.  Minimal biapical pleural parenchymal scarring.  A 3 mm nodule in the posterior right upper lobe, along the right major fissure, is likely stable from 09/18/2012.  Tiny right pleural effusion. Airway is unremarkable.  Incidental imaging of the upper abdomen shows no acute findings. No worrisome lytic or sclerotic lesions.  IMPRESSION:  1.  Negative for pulmonary embolus. 2.  Trace right pleural effusion. 3.  Tiny right upper lobe nodule is likely stable from 09/18/2012 and therefore considered benign.   Original Report Authenticated By: Leanna Battles, M.D.     Date: 08/22/2013  Rate:  103  Rhythm: sinus tachycardia  QRS Axis: normal  Intervals: normal  ST/T Wave abnormalities: nonspecific T wave changes, flattening V2  Conduction Disutrbances:none  Narrative Interpretation:   Old EKG Reviewed: unchanged    MDM   1. PND (paroxysmal nocturnal dyspnea)    Pt is a 42 y.o. female with Pmhx as above who presents SOB with laying flat.  No CP, cough, fever, leg swelling.  Hx of PE in past, not currently on anticoagulants.  Sinus tachycardia upon arrival w/ otherwise benign cardiopulm exam. No LE edema.  PERC +, moderate risk for PE by [facility].  EKG sinus tach w/o ischemic changes.  CBC, BMP unremakrable, BNP not elevated.  CXR unremarkable. CTA PE study ordered and was negative for PE.  I believe she is safe for further outpt w/u with PCP.  Return precautions given for new or worsening symptoms including SOB at rest, fever, chest pain, leg swelling.   1. PND (paroxysmal nocturnal dyspnea)         Shanna Cisco, MD 08/22/13 [phone]

## 2013-08-22 NOTE — ED Notes (Signed)
Pt reports having SOB at night, sts needs to raise her head on multiple pillows to help her breathe. Denies chest pain.

## 2013-08-22 NOTE — ED Provider Notes (Signed)
MSE was initiated and I personally evaluated the patient and placed orders (if any) at  2:12 PM on August 22, 2013.  The patient appears stable so that the remainder of the MSE may be completed by another provider.  s-patient c/o Orthopnea requiring is worsening over the past week.  She also complains of waking up several times at night gasping for air.  She states it is now occurring with to 4 pillows and has been worsening over the past few days.  She also complains of shortness of breath on the right side.  The patient denies history of asthma, she denies severe stress, patient states she does not snore and she does not drink heavy quantities of alcohol.  The patient is on Clozaril and Vistaril.  She stated for anxiety.  She denies any stressful events.  Patient denies any swelling in her extremities.  O- patient appears anxious.   Constitutional: She is oriented to person, place, and time. She appears well-developed and well-nourished. No distress.  Neck: Normal range of motion.  no JVD Cardiovascular: Normal rate, regular rhythm and normal heart sounds.  Exam reveals no gallop and no friction rub.  No peripheral edema No murmur heard. Pulmonary/Chest: Shallow breathing.  Breath sounds Abdominal: Soft. Bowel sounds are normal. She exhibits no distension and no mass. There is no tenderness. There is no guarding.  Neurological: She is alert and oriented to person, place, and time.  Skin: Skin is warm and dry. She is not diaphoretic.   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  Would like to allow pericarditis or heart failure issues.  P-I have ordered workup heart failure.  Patient is also taking medications which may cause a prolonged QT syndrome. Decreased chest x-ray, EKG, CBC, BMP,  Pro-BNP, UA.  Patient will be moved to a higher acuity for more complicated workup.  She appears stable.  Arthor Captain, PA-C 08/22/13 1423

## 2013-08-22 NOTE — ED Notes (Signed)
Pt states that when she lays down at night, she has trouble breathing.  States she can breathe in fine but when she goes to breathe out, pt states it "feels like something is blocking it.

## 2013-08-22 NOTE — ED Notes (Signed)
Reassessed by PA. Triaged to 3. EKG in progress

## 2013-08-24 ENCOUNTER — Other Ambulatory Visit (HOSPITAL_COMMUNITY): Payer: Self-pay | Admitting: Family Medicine

## 2013-08-24 DIAGNOSIS — R0602 Shortness of breath: Secondary | ICD-10-CM

## 2013-08-24 DIAGNOSIS — R0600 Dyspnea, unspecified: Secondary | ICD-10-CM

## 2013-08-29 ENCOUNTER — Ambulatory Visit (HOSPITAL_COMMUNITY)
Admission: RE | Admit: 2013-08-29 | Discharge: 2013-08-29 | Disposition: A | Discharge status: Home / Self Care | Payer: 59 | Source: Ambulatory Visit | Attending: Family Medicine | Admitting: Family Medicine

## 2013-08-29 DIAGNOSIS — R0602 Shortness of breath: Secondary | ICD-10-CM | POA: Diagnosis not present

## 2013-08-29 DIAGNOSIS — R0600 Dyspnea, unspecified: Secondary | ICD-10-CM

## 2013-08-29 DIAGNOSIS — R0989 Other specified symptoms and signs involving the circulatory and respiratory systems: Secondary | ICD-10-CM | POA: Insufficient documentation

## 2013-08-29 DIAGNOSIS — R0609 Other forms of dyspnea: Secondary | ICD-10-CM | POA: Insufficient documentation

## 2013-08-29 NOTE — Progress Notes (Signed)
Echocardiogram 2D Echocardiogram has been performed.  [name] [name] 08/29/2013, 11:42 AM

## 2013-08-30 ENCOUNTER — Telehealth: Payer: Self-pay | Admitting: Hematology and Oncology

## 2013-08-30 ENCOUNTER — Ambulatory Visit (HOSPITAL_BASED_OUTPATIENT_CLINIC_OR_DEPARTMENT_OTHER): Payer: 59 | Admitting: Hematology and Oncology

## 2013-08-30 ENCOUNTER — Encounter: Payer: Self-pay | Admitting: Hematology and Oncology

## 2013-08-30 ENCOUNTER — Ambulatory Visit: Payer: 59

## 2013-08-30 VITALS — BP 111/78 | HR 108 | Resp 18 | Ht 64.0 in | Wt 185.5 lb

## 2013-08-30 DIAGNOSIS — D649 Anemia, unspecified: Secondary | ICD-10-CM

## 2013-08-30 DIAGNOSIS — D509 Iron deficiency anemia, unspecified: Secondary | ICD-10-CM

## 2013-08-30 MED ORDER — FERROUS FUMARATE 324 MG PO TABS: 1.0000 | ORAL_TABLET | Freq: Two times a day (BID) | ORAL | Status: DC

## 2013-08-30 NOTE — Telephone Encounter (Signed)
Gave pt appt for lab and MD on October 2014 °

## 2013-08-30 NOTE — Progress Notes (Signed)
Checked in new patient with no financial issues. Mail and phone for communication. °
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
## 2013-09-27 ENCOUNTER — Telehealth: Payer: Self-pay | Admitting: Hematology and Oncology

## 2013-09-27 NOTE — Telephone Encounter (Signed)
Moved 10/6 appt to 10/9 from CP2 to NG. lmonvm for pt re change w/new d/t for lb/NG 10/9 @ 12:15pm. Schedule mailed.

## 2013-10-01 ENCOUNTER — Other Ambulatory Visit: Payer: Self-pay | Admitting: Lab

## 2013-10-01 ENCOUNTER — Ambulatory Visit: Payer: Self-pay

## 2013-10-03 ENCOUNTER — Other Ambulatory Visit: Payer: Self-pay | Admitting: Hematology and Oncology

## 2013-10-03 DIAGNOSIS — D62 Acute posthemorrhagic anemia: Secondary | ICD-10-CM

## 2013-10-04 ENCOUNTER — Encounter: Payer: Self-pay | Admitting: Hematology and Oncology

## 2013-10-04 ENCOUNTER — Other Ambulatory Visit (HOSPITAL_BASED_OUTPATIENT_CLINIC_OR_DEPARTMENT_OTHER): Payer: 59 | Admitting: Lab

## 2013-10-04 ENCOUNTER — Telehealth: Payer: Self-pay | Admitting: Hematology and Oncology

## 2013-10-04 ENCOUNTER — Encounter (INDEPENDENT_AMBULATORY_CARE_PROVIDER_SITE_OTHER): Payer: Self-pay

## 2013-10-04 ENCOUNTER — Ambulatory Visit (HOSPITAL_BASED_OUTPATIENT_CLINIC_OR_DEPARTMENT_OTHER): Payer: 59 | Admitting: Hematology and Oncology

## 2013-10-04 ENCOUNTER — Telehealth: Payer: Self-pay | Admitting: *Deleted

## 2013-10-04 VITALS — BP 120/74 | HR 79 | Temp 98.0°F | Resp 20 | Wt 191.7 lb

## 2013-10-04 DIAGNOSIS — N920 Excessive and frequent menstruation with regular cycle: Secondary | ICD-10-CM

## 2013-10-04 DIAGNOSIS — D62 Acute posthemorrhagic anemia: Secondary | ICD-10-CM

## 2013-10-04 DIAGNOSIS — R197 Diarrhea, unspecified: Secondary | ICD-10-CM

## 2013-10-04 LAB — COMPREHENSIVE METABOLIC PANEL (CC13)
ALT: 12 U/L (ref 0–55)
Anion Gap: 7 mEq/L (ref 3–11)
CO2: 23 mEq/L (ref 22–29)
Chloride: 110 mEq/L — ABNORMAL HIGH (ref 98–109)
Potassium: 4.3 mEq/L (ref 3.5–5.1)
Sodium: 141 mEq/L (ref 136–145)
Total Bilirubin: 0.25 mg/dL (ref 0.20–1.20)
Total Protein: 7.1 g/dL (ref 6.4–8.3)

## 2013-10-04 LAB — CBC & DIFF AND RETIC
Basophils Absolute: 0.1 10*3/uL (ref 0.0–0.1)
EOS%: 1.8 % (ref 0.0–7.0)
HCT: 32.7 % — ABNORMAL LOW (ref 34.8–46.6)
HGB: 10.1 g/dL — ABNORMAL LOW (ref 11.6–15.9)
MCH: 23.3 pg — ABNORMAL LOW (ref 25.1–34.0)
MCV: 75.3 fL — ABNORMAL LOW (ref 79.5–101.0)
MONO%: 10.9 % (ref 0.0–14.0)
NEUT%: 56.4 % (ref 38.4–76.8)
RDW: 16.7 % — ABNORMAL HIGH (ref 11.2–14.5)
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
## 2013-10-04 NOTE — Telephone Encounter (Signed)
Per staff message and POF I have scheduled appts.  JMW  

## 2013-10-04 NOTE — Telephone Encounter (Signed)
Gave pt appt for lab and MD on November 2014 , emaile Michelle regarding IV Iron for next week

## 2013-10-05 ENCOUNTER — Telehealth: Payer: Self-pay | Admitting: Hematology and Oncology

## 2013-10-05 NOTE — Telephone Encounter (Signed)
Talked to pt and gave her appt for 10/13 IV Iron

## 2013-10-08 ENCOUNTER — Ambulatory Visit (HOSPITAL_BASED_OUTPATIENT_CLINIC_OR_DEPARTMENT_OTHER): Payer: 59

## 2013-10-08 VITALS — BP 128/75 | HR 102 | Temp 98.9°F | Resp 20

## 2013-10-08 DIAGNOSIS — D509 Iron deficiency anemia, unspecified: Secondary | ICD-10-CM

## 2013-10-08 DIAGNOSIS — D62 Acute posthemorrhagic anemia: Secondary | ICD-10-CM

## 2013-10-08 MED ORDER — SODIUM CHLORIDE 0.9 % IV SOLN
Freq: Once | INTRAVENOUS | Status: AC
  Administered 2013-10-08: 12:00:00 via INTRAVENOUS

## 2013-10-08 MED ORDER — SODIUM CHLORIDE 0.9 % IV SOLN
1020.0000 mg | Freq: Once | INTRAVENOUS | Status: AC
  Administered 2013-10-08: 1020 mg via INTRAVENOUS
  Filled 2013-10-08: qty 34

## 2013-10-08 NOTE — Patient Instructions (Signed)
Ferumoxytol injection What is this medicine? FERUMOXYTOL is an iron complex. Iron is used to make healthy red blood cells, which carry oxygen and nutrients throughout the body. This medicine is used to treat iron deficiency anemia in people with chronic kidney disease. This medicine may be used for other purposes; ask your health care provider or pharmacist if you have questions. What should I tell my health care provider before I take this medicine? They need to know if you have any of these conditions: -anemia not caused by low iron levels -high levels of iron in the blood -magnetic resonance imaging (MRI) test scheduled -an unusual or allergic reaction to iron, other medicines, foods, dyes, or preservatives -pregnant or trying to get pregnant -breast-feeding How should I use this medicine? This medicine is for infusion into a vein. It is given by a health care professional in a hospital or clinic setting. Talk to your pediatrician regarding the use of this medicine in children. Special care may be needed. Overdosage: If you think you've taken too much of this medicine contact a poison control center or emergency room at once. Overdosage: If you think you have taken too much of this medicine contact a poison control center or emergency room at once. NOTE: This medicine is only for you. Do not share this medicine with others. What if I miss a dose? It is important not to miss your dose. Call your doctor or health care professional if you are unable to keep an appointment. What may interact with this medicine? This medicine may interact with the following medications: -other iron products This list may not describe all possible interactions. Give your health care provider a list of all the medicines, herbs, non-prescription drugs, or dietary supplements you use. Also tell them if you smoke, drink alcohol, or use illegal drugs. Some items may interact with your medicine. What should I watch  for while using this medicine? Visit your doctor or healthcare professional regularly. Tell your doctor or healthcare professional if your symptoms do not start to get better or if they get worse. You may need blood work done while you are taking this medicine. You may need to follow a special diet. Talk to your doctor. Foods that contain iron include: whole grains/cereals, dried fruits, beans, or peas, leafy green vegetables, and organ meats (liver, kidney). What side effects may I notice from receiving this medicine? Side effects that you should report to your doctor or health care professional as soon as possible: -allergic reactions like skin rash, itching or hives, swelling of the face, lips, or tongue -breathing problems -changes in blood pressure -feeling faint or lightheaded, falls -fever or chills -flushing, sweating, or hot feelings -swelling of the ankles or feet Side effects that usually do not require medical attention (Report these to your doctor or health care professional if they continue or are bothersome.): -diarrhea -headache -nausea, vomiting -stomach pain This list may not describe all possible side effects. Call your doctor for medical advice about side effects. You may report side effects to FDA at 1-800-FDA-1088. Where should I keep my medicine? This drug is given in a hospital or clinic and will not be stored at home. NOTE: This sheet is a summary. It may not cover all possible information. If you have questions about this medicine, talk to your doctor, pharmacist, or health care provider.  2013, [employer]vier/[employer] Standard. (09/04/2008 9:48:25 PM)  

## 2013-10-16 ENCOUNTER — Encounter (HOSPITAL_COMMUNITY): Payer: Self-pay | Admitting: Pharmacist

## 2013-10-17 ENCOUNTER — Telehealth: Payer: Self-pay | Admitting: *Deleted

## 2013-10-17 ENCOUNTER — Encounter (HOSPITAL_COMMUNITY): Payer: Self-pay

## 2013-10-17 ENCOUNTER — Encounter (HOSPITAL_COMMUNITY)
Admission: RE | Admit: 2013-10-17 | Discharge: 2013-10-17 | Disposition: A | Discharge status: Home / Self Care | Payer: 59 | Source: Ambulatory Visit | Attending: Obstetrics and Gynecology | Admitting: Obstetrics and Gynecology

## 2013-10-17 DIAGNOSIS — Z01812 Encounter for preprocedural laboratory examination: Secondary | ICD-10-CM | POA: Insufficient documentation

## 2013-10-17 LAB — CBC
MCH: 24.1 pg — ABNORMAL LOW (ref 26.0–34.0)
MCHC: 31.4 g/dL (ref 30.0–36.0)
MCV: 76.7 fL — ABNORMAL LOW (ref 78.0–100.0)
Platelets: 358 10*3/uL (ref 150–400)
RBC: 4.6 MIL/uL (ref 3.87–5.11)
WBC: 5.6 10*3/uL (ref 4.0–10.5)

## 2013-10-17 LAB — BASIC METABOLIC PANEL
BUN: 10 mg/dL (ref 6–23)
CO2: 27 mEq/L (ref 19–32)
Calcium: 10 mg/dL (ref 8.4–10.5)
Creatinine, Ser: 0.83 mg/dL (ref 0.50–1.10)
Sodium: 140 mEq/L (ref 135–145)

## 2013-10-17 NOTE — Patient Instructions (Signed)
20 [name] [name]  10/17/2013   Your procedure is scheduled on:  10/24/13  Enter through the Main Entrance of [facility] at 6 AM.  Pick up the phone at the desk and dial [phone].   Call this number if you have problems the morning of surgery: [phone]   Remember:   Do not eat food:After Midnight.  Do not drink clear liquids: After Midnight.  Take these medicines the morning of surgery with A SIP OF WATER: hold Metformin for 24hrs prior to surgery   Do not wear jewelry, make-up or nail polish.  Do not wear lotions, powders, or perfumes. You may wear deodorant.  Do not shave 48 hours prior to surgery.  Do not bring valuables to the hospital.  [facility] is not   responsible for any belongings or valuables brought to the hospital.  Contacts, dentures or bridgework may not be worn into surgery.  Leave suitcase in the car. After surgery it may be brought to your room.  For patients admitted to the hospital, checkout time is 11:00 AM the day of              discharge.   Patients discharged the day of surgery will not be allowed to drive             home.  Name and phone number of your driver: husband   [name] [name]  Special Instructions:   Shower using CHG 2 nights before surgery and the night before surgery.  If you shower the day of surgery use CHG.  Use special wash - you have one bottle of CHG for all showers.  You should use approximately 1/3 of the bottle for each shower.   Please read over the following fact sheets that you were given:   Surgical Site Infection Prevention

## 2013-10-17 NOTE — Telephone Encounter (Signed)
Her most recent labs were not too bad ?whether she is hypoglycemic since she is on metformin? I can't think of why She may need to see her PCP

## 2013-10-17 NOTE — Telephone Encounter (Signed)
Pt reports not feeling well today,  With some SOB,  Feeling "extremely tired", dizzy and "out of it."   She had some pre-op labs done today and says nurse told her to report these symptoms to Dr. Bertis Ruddy.

## 2013-10-18 NOTE — Telephone Encounter (Signed)
Left VM for pt to return nurse's call regarding Dr. Maxine Glenn reply.

## 2013-10-18 NOTE — Telephone Encounter (Signed)
Informed pt of Dr. Maxine Glenn messag below.  Pt states she is feeling a little better today.  Instructed her to contact PCP if symptoms persist, it may be related to her blood sugar.  Pt verbalized understanding.

## 2013-10-22 DIAGNOSIS — N920 Excessive and frequent menstruation with regular cycle: Secondary | ICD-10-CM | POA: Diagnosis not present

## 2013-10-23 DIAGNOSIS — N949 Unspecified condition associated with female genital organs and menstrual cycle: Secondary | ICD-10-CM | POA: Diagnosis not present
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
## 2013-10-24 ENCOUNTER — Encounter (HOSPITAL_COMMUNITY): Payer: Self-pay | Admitting: General Practice

## 2013-10-24 ENCOUNTER — Encounter (HOSPITAL_COMMUNITY): Payer: 59 | Admitting: Anesthesiology

## 2013-10-24 ENCOUNTER — Encounter (HOSPITAL_COMMUNITY)
Admission: RE | Disposition: A | Discharge status: Home / Self Care | Payer: Self-pay | Source: Ambulatory Visit | Attending: Obstetrics and Gynecology

## 2013-10-24 ENCOUNTER — Encounter (HOSPITAL_COMMUNITY): Payer: Self-pay | Admitting: *Deleted

## 2013-10-24 ENCOUNTER — Ambulatory Visit (HOSPITAL_COMMUNITY): Payer: 59 | Admitting: Anesthesiology

## 2013-10-24 ENCOUNTER — Inpatient Hospital Stay (HOSPITAL_COMMUNITY)
Admission: AD | Admit: 2013-10-24 | Discharge: 2013-10-24 | Disposition: A | Discharge status: Home / Self Care | Payer: 59 | Source: Ambulatory Visit | Attending: Obstetrics and Gynecology | Admitting: Obstetrics and Gynecology

## 2013-10-24 ENCOUNTER — Ambulatory Visit (HOSPITAL_COMMUNITY)
Admission: RE | Admit: 2013-10-24 | Discharge: 2013-10-24 | Disposition: A | Discharge status: Home / Self Care | Payer: 59 | Source: Ambulatory Visit | Attending: Obstetrics and Gynecology | Admitting: Obstetrics and Gynecology

## 2013-10-24 DIAGNOSIS — Y839 Surgical procedure, unspecified as the cause of abnormal reaction of the patient, or of later complication, without mention of misadventure at the time of the procedure: Secondary | ICD-10-CM | POA: Insufficient documentation

## 2013-10-24 DIAGNOSIS — N920 Excessive and frequent menstruation with regular cycle: Secondary | ICD-10-CM | POA: Diagnosis not present

## 2013-10-24 DIAGNOSIS — N924 Excessive bleeding in the premenopausal period: Secondary | ICD-10-CM | POA: Diagnosis not present

## 2013-10-24 DIAGNOSIS — N9989 Other postprocedural complications and disorders of genitourinary system: Secondary | ICD-10-CM | POA: Insufficient documentation

## 2013-10-24 DIAGNOSIS — N840 Polyp of corpus uteri: Secondary | ICD-10-CM | POA: Diagnosis not present

## 2013-10-24 DIAGNOSIS — R339 Retention of urine, unspecified: Secondary | ICD-10-CM | POA: Insufficient documentation

## 2013-10-24 DIAGNOSIS — R338 Other retention of urine: Secondary | ICD-10-CM

## 2013-10-24 DIAGNOSIS — D649 Anemia, unspecified: Secondary | ICD-10-CM | POA: Diagnosis not present

## 2013-10-24 DIAGNOSIS — IMO0002 Reserved for concepts with insufficient information to code with codable children: Secondary | ICD-10-CM | POA: Insufficient documentation

## 2013-10-24 HISTORY — PX: DILATATION & CURETTAGE/HYSTEROSCOPY WITH TRUECLEAR: SHX6353

## 2013-10-24 SURGERY — DILATATION & CURETTAGE/HYSTEROSCOPY WITH TRUCLEAR
Anesthesia: Spinal | Site: Uterus | Wound class: Clean Contaminated

## 2013-10-24 MED ORDER — ONDANSETRON HCL 4 MG/2ML IJ SOLN
INTRAMUSCULAR | Status: DC | PRN
  Administered 2013-10-24: 4 mg via INTRAVENOUS

## 2013-10-24 MED ORDER — LIDOCAINE HCL (CARDIAC) 20 MG/ML IV SOLN
INTRAVENOUS | Status: DC | PRN
  Administered 2013-10-24: 50 mg via INTRAVENOUS

## 2013-10-24 MED ORDER — FENTANYL CITRATE 0.05 MG/ML IJ SOLN
INTRAMUSCULAR | Status: AC
  Filled 2013-10-24: qty 2

## 2013-10-24 MED ORDER — MIDAZOLAM HCL 2 MG/2ML IJ SOLN
INTRAMUSCULAR | Status: DC | PRN
  Administered 2013-10-24 (×2): 1 mg via INTRAVENOUS

## 2013-10-24 MED ORDER — FENTANYL CITRATE 0.05 MG/ML IJ SOLN
INTRAMUSCULAR | Status: DC | PRN
  Administered 2013-10-24 (×2): 50 ug via INTRAVENOUS

## 2013-10-24 MED ORDER — HYDROCODONE-IBUPROFEN 7.5-200 MG PO TABS: 1.0000 | ORAL_TABLET | Freq: Three times a day (TID) | ORAL | Status: DC | PRN

## 2013-10-24 MED ORDER — CLINDAMYCIN PHOSPHATE 900 MG/50ML IV SOLN
900.0000 mg | Freq: Once | INTRAVENOUS | Status: AC
  Administered 2013-10-24: 900 mg via INTRAVENOUS
  Filled 2013-10-24: qty 50

## 2013-10-24 MED ORDER — BUPIVACAINE IN DEXTROSE 0.75-8.25 % IT SOLN
INTRATHECAL | Status: DC | PRN
  Administered 2013-10-24: 1.2 mL via INTRATHECAL

## 2013-10-24 MED ORDER — LACTATED RINGERS IV SOLN
INTRAVENOUS | Status: DC
  Administered 2013-10-24 (×2): via INTRAVENOUS

## 2013-10-24 MED ORDER — LIDOCAINE HCL 1 % IJ SOLN
INTRAMUSCULAR | Status: DC | PRN
  Administered 2013-10-24: 9 mL

## 2013-10-24 MED ORDER — FENTANYL CITRATE 0.05 MG/ML IJ SOLN
25.0000 ug | INTRAMUSCULAR | Status: DC | PRN
  Administered 2013-10-24 (×2): 50 ug via INTRAVENOUS

## 2013-10-24 MED ORDER — LIDOCAINE HCL 1 % IJ SOLN
INTRAMUSCULAR | Status: AC
  Filled 2013-10-24: qty 20

## 2013-10-24 MED ORDER — SODIUM CHLORIDE 0.9 % IR SOLN
Status: DC | PRN
  Administered 2013-10-24: 3000 mL

## 2013-10-24 MED ORDER — PROPOFOL 10 MG/ML IV BOLUS
INTRAVENOUS | Status: DC | PRN
  Administered 2013-10-24: 10 mg via INTRAVENOUS
  Administered 2013-10-24 (×2): 20 mg via INTRAVENOUS

## 2013-10-24 SURGICAL SUPPLY — 23 items
BLADE INCISOR TRUC PLUS 2.9 (ABLATOR) ×1 IMPLANT
CANISTERS HI-FLOW 3000CC (CANNISTER) ×2 IMPLANT
CATH ROBINSON RED A/P 16FR (CATHETERS) ×2 IMPLANT
CLOTH BEACON ORANGE TIMEOUT ST (SAFETY) ×2 IMPLANT
CONTAINER PREFILL 10% NBF 60ML (FORM) ×4 IMPLANT
DECANTER SPIKE VIAL GLASS SM (MISCELLANEOUS) ×2 IMPLANT
DRAPE HYSTEROSCOPY (DRAPE) ×2 IMPLANT
DRESSING TELFA 8X3 (GAUZE/BANDAGES/DRESSINGS) ×2 IMPLANT
ELECT REM PT RETURN 9FT ADLT (ELECTROSURGICAL)
ELECTRODE REM PT RTRN 9FT ADLT (ELECTROSURGICAL) IMPLANT
GLOVE BIO SURGEON STRL SZ 6.5 (GLOVE) ×2 IMPLANT
GLOVE BIOGEL PI IND STRL 7.0 (GLOVE) ×1 IMPLANT
GLOVE BIOGEL PI INDICATOR 7.0 (GLOVE) ×1
GOWN STRL REIN XL XLG (GOWN DISPOSABLE) ×4 IMPLANT
INCISOR TRUC PLUS BLADE 2.9 (ABLATOR) ×2
KIT HYSTEROSCOPY TRUCLEAR (ABLATOR) ×2 IMPLANT
MORCELLATOR RECIP TRUCLEAR 4.0 (ABLATOR) IMPLANT
NEEDLE SPNL 22GX3.5 QUINCKE BK (NEEDLE) ×2 IMPLANT
PACK VAGINAL MINOR WOMEN LF (CUSTOM PROCEDURE TRAY) ×2 IMPLANT
PAD OB MATERNITY 4.3X12.25 (PERSONAL CARE ITEMS) ×2 IMPLANT
SYR CONTROL 10ML LL (SYRINGE) ×2 IMPLANT
TOWEL OR 17X24 6PK STRL BLUE (TOWEL DISPOSABLE) ×4 IMPLANT
WATER STERILE IRR 1000ML POUR (IV SOLUTION) ×2 IMPLANT

## 2013-10-24 NOTE — Anesthesia Postprocedure Evaluation (Signed)
Anesthesia Post Note  Patient: [name] [name]  Procedure(s) Performed: Procedure(s) (LRB): DILATATION & CURETTAGE/HYSTEROSCOPY WITH TRUECLEAR, CERVICAL BLOCK (N/A)  Anesthesia type: Spinal  Patient location: PACU  Post pain: Pain level controlled  Post assessment: Post-op Vital signs reviewed  Last Vitals:  Filed Vitals:   10/24/13 0930  BP: 114/63  Pulse: 86  Temp:   Resp: 5    Post vital signs: Reviewed  Level of consciousness: awake  Complications: No apparent anesthesia complications

## 2013-10-24 NOTE — Anesthesia Procedure Notes (Signed)
Spinal  Patient location during procedure: OR Start time: 10/24/2013 7:30 AM End time: 10/24/2013 7:32 AM Staffing Anesthesiologist: Leilani Able Performed by: anesthesiologist  Preanesthetic Checklist Completed: patient identified, surgical consent, pre-op evaluation, timeout performed, IV checked, risks and benefits discussed and monitors and equipment checked Spinal Block Patient position: sitting Prep: DuraPrep Patient monitoring: heart rate, cardiac monitor, continuous pulse ox and blood pressure Approach: midline Location: L3-4 Injection technique: single-shot Needle Needle type: Sprotte and Pencan  Needle gauge: 24 G Needle length: 9 cm Needle insertion depth: 5 cm Assessment Sensory level: T10

## 2013-10-24 NOTE — Transfer of Care (Signed)
Immediate Anesthesia Transfer of Care Note  Patient: [name] [name]  Procedure(s) Performed: Procedure(s): DILATATION & CURETTAGE/HYSTEROSCOPY WITH TRUECLEAR, CERVICAL BLOCK (N/A)  Patient Location: PACU  Anesthesia Type:Spinal  Level of Consciousness: awake, alert  and oriented  Airway & Oxygen Therapy: Patient Spontanous Breathing and Patient connected to nasal cannula oxygen  Post-op Assessment: Report given to PACU RN and Post -op Vital signs reviewed and stable  Post vital signs: Reviewed and stable  Complications: No apparent anesthesia complications

## 2013-10-24 NOTE — MAU Provider Note (Signed)
First Provider Initiated Contact with Patient 10/24/13 2218      Chief Complaint:  Post-op Problem   [name] [name] is  42 y.o. No obstetric history on file..  Patient's last menstrual period was 10/13/2013.[name]  She had a hysteroscopy, D&C, and resection of endometrial mass (presumed polyp) this morning that was uncomplicated per pt (no op note in EPIC yet).  She had and in and out cath before leaving day surgery around 1300, and has been unable to void since.   Past Medical History  Diagnosis Date  . Heart murmur     asa child   . Anemia     hx of   . Depression   . Anxiety   . Anemia   . Chronic kidney disease     kidney stone   . Chronic kidney disease   . Renal insufficiency   . Blood dyscrasia     Past Surgical History  Procedure Laterality Date  . Other surgical history      cyst removed from ovary ? side   . Nephrolithotomy  03/31/2012    Procedure: NEPHROLITHOTOMY PERCUTANEOUS;  Surgeon: Garnett Farm, MD;  Location: [facility] ORS;  Service: Urology;  Laterality: Left;  . Tubal ligation    . Laparotomy  07/31/2012    Procedure: EXPLORATORY LAPAROTOMY;  Surgeon: Shelly Rubenstein, MD;  Location: [facility] OR;  Service: General;  Laterality: N/A;  REPAIR OF PANCREATIC INJURY, EXPLORATION OF RETROPERITONEUM.  [name] Colostomy  07/31/2012    Procedure: COLOSTOMY;  Surgeon: Shelly Rubenstein, MD;  Location: [facility] OR;  Service: General;  Laterality: Right;  . Colostomy reversal    . Gws  2013    ABDOMINAL SURGERY  . Colostomy closure N/A 02/15/2013    Procedure: COLOSTOMY CLOSURE;  Surgeon: Cherylynn Ridges, MD;  Location: [facility] OR;  Service: General;  Laterality: N/A;  Reversal of colostomy    Family History  Problem Relation Age of Onset  . Cancer Mother     kidney    History  Substance Use Topics  . Smoking status: Never Smoker   . Smokeless tobacco: Never Used  . Alcohol Use: No    Allergies:  Allergies  Allergen Reactions  . Augmentin [Amoxicillin-Pot Clavulanate] Itching, Swelling  and Rash  . Avelox [Moxifloxacin Hcl In Nacl] Itching, Swelling and Rash  . Sodium Hydroxide Rash    Prescriptions prior to admission  Medication Sig Dispense Refill  . atropine 1 % ophthalmic solution Place 2 drops under the tongue at bedtime.       . cloZAPine (CLOZARIL) 25 MG tablet Take 175 mg by mouth at bedtime.      . docusate sodium (COLACE) 100 MG capsule Take 100 mg by mouth daily as needed for constipation.      . Ferrous Fumarate 324 MG TABS Take 1 tablet (106 mg of iron total) by mouth 2 (two) times daily.  60 each  3  . HYDROcodone-ibuprofen (VICOPROFEN) 7.5-200 MG per tablet Take 1-2 tablets by mouth every 8 (eight) hours as needed for pain.  20 tablet  0  . hydrOXYzine (ATARAX/VISTARIL) 50 MG tablet Take 50 mg by mouth 2 (two) times daily.       . hydrOXYzine (VISTARIL) 50 MG capsule       . metFORMIN (GLUCOPHAGE) 500 MG tablet Take 500 mg by mouth at bedtime.       . Multiple Vitamin (MULTIVITAMIN WITH MINERALS) TABS Take 1 tablet by mouth every evening.       [name]  Vitamin D, Ergocalciferol, (DRISDOL) 50000 UNITS CAPS capsule Take 50,000 Units by mouth 1 day or 1 dose.         Review of Systems   Constitutional: Negative for fever and chills Eyes: Negative for visual disturbances Respiratory: Negative for shortness of breath, dyspnea Cardiovascular: Negative for chest pain or palpitations  Gastrointestinal: Negative for vomiting, diarrhea and constipation Genitourinary: Negative for dysuria and urgency.  POSITIVE for inability to void since surgery this am Musculoskeletal: Negative for back pain, joint pain, myalgias  Neurological: Negative for dizziness and headaches    Physical Exam   Blood pressure 127/78, pulse 111, temperature 98.7 F (37.1 C), temperature source Oral, resp. rate 16, height 5\' 4"  (1.626 m), weight 196 lb 3.2 oz (88.996 kg), last menstrual period 10/13/2013.  General: General appearance - oriented to person, place, and time Chest - clear to  auscultation, no wheezes, rales or rhonchi, symmetric air entry Heart - normal rate and regular rhythm Abdomen - tenderness noted suprapubic area.  Bladder scan shows around 300-350cc urine Pelvic - examination not indicated Extremities - peripheral pulses normal, no pedal edema, no clubbing or cyanosis     Assessment: Patient Active Problem List   Diagnosis Date Noted  . Liver mass, left lobe 05/28/2013  . Postop check 03/13/2013  . Postoperative wound infection 02/23/2013  . History of colostomy 02/16/2013  . Borderline personality disorder 08/16/2012    Class: Chronic  . Acute blood loss anemia 08/01/2012  . Depression, major 08/01/2012  . Renal calculus, left 03/31/2012  . Hydronephrosis, left 03/31/2012   Unable to void post op Plan:  Discussed with Dr. Marcelle Overlie.  Since it is late, will leave and indwelling catheter in and send pt home. She is to call the office in the morning to have it removed, or she may remove it herself by cutting the tube.  CRESENZO-DISHMAN,Saud Bail

## 2013-10-24 NOTE — MAU Note (Signed)
Pt post D & C today, unable to urinate since surgery.  In and out cath at 1330.

## 2013-10-24 NOTE — Anesthesia Preprocedure Evaluation (Addendum)
Anesthesia Evaluation  Patient identified by MRN, date of birth, ID band Patient awake    Reviewed: Allergy & Precautions, H&P , Patient's Chart, lab work & pertinent test results, reviewed documented beta blocker date and time   Airway Mallampati: II TM Distance: >3 FB Neck ROM: full    Dental no notable dental hx.    Pulmonary  breath sounds clear to auscultation  Pulmonary exam normal       Cardiovascular Rhythm:regular Rate:Normal     Neuro/Psych    GI/Hepatic   Endo/Other    Renal/GU      Musculoskeletal   Abdominal   Peds  Hematology   Anesthesia Other Findings   Reproductive/Obstetrics                           Anesthesia Physical Anesthesia Plan  ASA: II  Anesthesia Plan: Spinal   Post-op Pain Management:    Induction: Intravenous  Airway Management Planned:   Additional Equipment:   Intra-op Plan:   Post-operative Plan:   Informed Consent: I have reviewed the patients History and Physical, chart, labs and discussed the procedure including the risks, benefits and alternatives for the proposed anesthesia with the patient or authorized representative who has indicated his/her understanding and acceptance.   Dental Advisory Given and Dental advisory given  Plan Discussed with: CRNA and Surgeon  Anesthesia Plan Comments: (Lab work confirmed with CRNA in room. Platelets okay. Discussed spinal anesthetic, and patient consents to the procedure:  included risk of possible headache,backache, failed block, allergic reaction, and nerve injury. This patient was asked if she had any questions or concerns before the procedure started. )       Anesthesia Quick Evaluation

## 2013-10-24 NOTE — MAU Note (Signed)
Pt in this evening d/t trouble urinating after a D&C this AM. Pt states and in &out catheter was done at 1315 but no urination since then.

## 2013-10-25 ENCOUNTER — Encounter (HOSPITAL_COMMUNITY): Payer: Self-pay | Admitting: Obstetrics and Gynecology

## 2013-10-25 NOTE — Op Note (Signed)
NAMEJANETTA, [name] [name] NO.:  [id number]  MEDICAL RECORD NO.:  [id number]  LOCATION:  [facility]                          FACILITY:  [facility]  PHYSICIAN:  Zelphia Cairo, MD    DATE OF BIRTH:  09/20/1971  DATE OF PROCEDURE: DATE OF DISCHARGE:  10/24/2013                              OPERATIVE REPORT   PREOPERATIVE DIAGNOSES: 1. Menorrhagia. 2. Endometrial mass.  POSTOPERATIVE DIAGNOSES: 1. Menorrhagia. 2. Endometrial mass, path pending.  PROCEDURE: 1. Cervical block. 2. Hysteroscopy. 3. D and C. 4. Resection of endometrial polyp.  PATHOLOGY:  Endometrial mass.  ANESTHESIA:  Spinal MAC and local.  SURGEON:  Zelphia Cairo, MD  COMPLICATIONS:  None.  CONDITION:  Stable to recovery room.  DESCRIPTION OF PROCEDURE:  The patient was taken to the operating room. After informed consent was obtained, she was given spinal anesthesia. Placed in the dorsal lithotomy position using Allen stirrups.  She was prepped and draped in sterile fashion.  An in and out catheter was used to drain her bladder for an unmeasured amount of urine.  Bivalve speculum was placed in the vagina and 1 mL of 1% lidocaine was placed at 12 o'clock of the cervix.  Single-tooth tenaculum was attached to the anterior lip of the cervix.  Then, the remaining 8 mL of 1% lidocaine was used to perform a cervical block.  The cervix was then easily dilated using Pratt dilators and the hysteroscope was inserted.  A large polypoid mass was noted within the endometrial cavity.  The true clear device was inserted through the scope and the endometrial polyp was resected to the base.  The remainder of the endometrial cavity appeared normal.  Gentle curetting was performed.  Specimen was placed on Telfa and passed off to be sent to pathology.  All instruments were removed from the uterus.  The tenaculum was removed from the cervix.  The cervix was hemostatic.  Speculum was removed.  Fluid deficit was 50 mL.   Sponge, lap, needle, and instrument counts were correct x2.     Zelphia Cairo, MD     GA/MEDQ  D:  10/24/2013  T:  10/25/2013  Job:  [id number]

## 2013-11-01 ENCOUNTER — Other Ambulatory Visit: Payer: Self-pay

## 2013-11-15 ENCOUNTER — Telehealth: Payer: Self-pay | Admitting: Hematology and Oncology

## 2013-11-15 ENCOUNTER — Other Ambulatory Visit: Payer: Self-pay | Admitting: Hematology and Oncology

## 2013-11-15 ENCOUNTER — Ambulatory Visit (HOSPITAL_BASED_OUTPATIENT_CLINIC_OR_DEPARTMENT_OTHER): Payer: 59 | Admitting: Hematology and Oncology

## 2013-11-15 ENCOUNTER — Other Ambulatory Visit (HOSPITAL_BASED_OUTPATIENT_CLINIC_OR_DEPARTMENT_OTHER): Payer: 59 | Admitting: Lab

## 2013-11-15 ENCOUNTER — Encounter: Payer: Self-pay | Admitting: Hematology and Oncology

## 2013-11-15 DIAGNOSIS — D500 Iron deficiency anemia secondary to blood loss (chronic): Secondary | ICD-10-CM

## 2013-11-15 DIAGNOSIS — D509 Iron deficiency anemia, unspecified: Secondary | ICD-10-CM | POA: Insufficient documentation

## 2013-11-15 DIAGNOSIS — R197 Diarrhea, unspecified: Secondary | ICD-10-CM

## 2013-11-15 DIAGNOSIS — N920 Excessive and frequent menstruation with regular cycle: Secondary | ICD-10-CM

## 2013-11-15 HISTORY — DX: Iron deficiency anemia, unspecified: D50.9

## 2013-11-15 LAB — CBC & DIFF AND RETIC
Basophils Absolute: 0.1 10*3/uL (ref 0.0–0.1)
EOS%: 3.1 % (ref 0.0–7.0)
HCT: 38.1 % (ref 34.8–46.6)
HGB: 12.2 g/dL (ref 11.6–15.9)
LYMPH%: 24.8 % (ref 14.0–49.7)
MCH: 26.2 pg (ref 25.1–34.0)
MCV: 81.6 fL (ref 79.5–101.0)
MONO#: 0.4 10*3/uL (ref 0.1–0.9)
MONO%: 6.8 % (ref 0.0–14.0)
NEUT%: 64.4 % (ref 38.4–76.8)
Platelets: 338 10*3/uL (ref 145–400)
RDW: 23 % — ABNORMAL HIGH (ref 11.2–14.5)
WBC: 6.3 10*3/uL (ref 3.9–10.3)

## 2013-11-15 LAB — IRON AND TIBC CHCC
%SAT: 21 % (ref 21–57)
Iron: 61 ug/dL (ref 41–142)
TIBC: 291 ug/dL (ref 236–444)

## 2013-11-15 LAB — RETICULOCYTES (CHCC)
ABS Retic: 55.1 10*3/uL (ref 19.0–186.0)
RBC.: 4.59 MIL/uL (ref 3.87–5.11)
Retic Ct Pct: 1.2 % (ref 0.4–2.3)

## 2013-11-15 NOTE — Progress Notes (Signed)
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 She had difficulties tolerating oral iron supplement. I recommend intravenous iron infusion. On 10/08/2013, we gave her one dose of intravenous iron Feraheme INTERVAL HISTORY: [name] [name] 42 y.o. female returns for further followup. She has improved energy level since the iron infusion. She denies pica. She still have significant diarrhea but less when she reduced the iron tablets once a day. The patient denies any recent signs or symptoms of bleeding such as spontaneous epistaxis, hematuria or hematochezia. Recently she had D&C for menorrhagia and biopsy came back negative for malignancy  I have reviewed the past medical history, past surgical history, social history and family history with the patient and they are unchanged from previous note.  ALLERGIES:  is allergic to augmentin; avelox; and sodium hydroxide.  MEDICATIONS:  Current Outpatient Prescriptions  Medication Sig Dispense Refill  . atropine 1 % ophthalmic solution Place 2 drops under the tongue at bedtime.       . cloZAPine (CLOZARIL) 25 MG tablet Take 175 mg by mouth at bedtime.      . docusate sodium (COLACE) 100 MG capsule Take 100 mg by mouth daily as needed for constipation.      . Ferrous Fumarate 324 MG TABS Take 1 tablet (106 mg of iron total) by mouth 2 (two) times daily.  60 each  3  . HYDROcodone-ibuprofen (VICOPROFEN) 7.5-200 MG per tablet Take 1-2 tablets by mouth every 8 (eight) hours as needed for pain.  20 tablet  0  . hydrOXYzine (ATARAX/VISTARIL) 50 MG tablet Take 50 mg by mouth 2 (two) times daily.       . hydrOXYzine (VISTARIL) 50 MG capsule       . metFORMIN (GLUCOPHAGE) 500 MG tablet Take 500 mg by mouth at bedtime.       . Multiple  Vitamin (MULTIVITAMIN WITH MINERALS) TABS Take 1 tablet by mouth every evening.       . Vitamin D, Ergocalciferol, (DRISDOL) 50000 UNITS CAPS capsule Take 50,000 Units by mouth 1 day or 1 dose.       No current facility-administered medications for this visit.     REVIEW OF SYSTEMS:   Constitutional: Denies fevers, chills or night sweats All other systems were reviewed with the patient and are negative.  PHYSICAL EXAMINATION: ECOG PERFORMANCE STATUS: 0 - Asymptomatic  There were no vitals filed for this visit. There were no vitals filed for this visit.  GENERAL:alert, no distress and comfortable NEURO: alert & oriented x 3 with fluent speech, no focal motor/sensory deficits  LABORATORY DATA:  I have reviewed the data as listed Results for orders placed in visit on 11/15/13 (from the past 48 hour(s))  CBC & DIFF AND RETIC     Status: Abnormal   Collection Time    11/15/13  1:57 PM      Result Value Range   WBC 6.3  3.9 - 10.3 10e3/uL   NEUT# 4.1  1.5 - 6.5 10e3/uL   HGB 12.2  11.6 - 15.9 g/dL   HCT 16.1  09.6 - 04.5 %   Platelets 338  145 - 400 10e3/uL   MCV 81.6  79.5 - 101.0 fL   MCH 26.2  25.1 - 34.0 pg   MCHC 32.2  31.5 - 36.0  g/dL   RBC 1.61  0.96 - 0.45 10e6/uL   RDW 23.0 (*) 11.2 - 14.5 %   lymph# 1.6  0.9 - 3.3 10e3/uL   MONO# 0.4  0.1 - 0.9 10e3/uL   Eosinophils Absolute 0.2  0.0 - 0.5 10e3/uL   Basophils Absolute 0.1  0.0 - 0.1 10e3/uL   NEUT% 64.4  38.4 - 76.8 %   LYMPH% 24.8  14.0 - 49.7 %   MONO% 6.8  0.0 - 14.0 %   EOS% 3.1  0.0 - 7.0 %   BASO% 0.9  0.0 - 2.0 %    Lab Results  Component Value Date   WBC 6.3 11/15/2013   HGB 12.2 11/15/2013   HCT 38.1 11/15/2013   MCV 81.6 11/15/2013   PLT 338 11/15/2013   ASSESSMENT & PLAN:  #1 iron deficiency anemia Ferritin is still pending. If ferritin is less than 100, I will bring her back for another iron infusion. If ferritin level is over 100, we will continue 1 oral iron supplement a day and see her back  in 4 months. #2 menorrhagia She had recent biopsy that came back negative. We will continue conservative management #3 diarrhea She will continue taking Imodium as needed. All questions were answered. The patient knows to call the clinic with any problems, questions or concerns. No barriers to learning was detected.  I spent 15 minutes counseling the patient face to face. The total time spent in the appointment was 20 minutes and more than 50% was on counseling.     [facility], Justus Duerr, MD 11/15/2013 2:32 PM

## 2013-11-15 NOTE — Progress Notes (Signed)
Repeat iron studies are back. Her ferritin only went up to 70. The patient had active vaginal bleeding from recent D&C. I recommend we give her another intravenous iron infusion as I suspect she will become anemic next month if we do not replenish her iron store given she still had active bleeding. The patient agreed to proceed.

## 2013-11-15 NOTE — Telephone Encounter (Signed)
gv adn printed appt sched and avs for pt for March 2015  °

## 2013-11-16 ENCOUNTER — Telehealth: Payer: Self-pay | Admitting: *Deleted

## 2013-11-16 NOTE — Telephone Encounter (Signed)
Per staff message and POF I have scheduled appts.  JMW  

## 2013-11-19 ENCOUNTER — Telehealth: Payer: Self-pay | Admitting: Hematology and Oncology

## 2013-11-19 NOTE — Telephone Encounter (Signed)
S/w the pt and she is aware of her iron infusion appt on 11/26/2013

## 2013-11-26 ENCOUNTER — Encounter: Payer: Self-pay | Admitting: *Deleted

## 2013-11-26 ENCOUNTER — Ambulatory Visit (HOSPITAL_BASED_OUTPATIENT_CLINIC_OR_DEPARTMENT_OTHER): Payer: 59

## 2013-11-26 VITALS — BP 137/79 | HR 108 | Temp 98.8°F

## 2013-11-26 DIAGNOSIS — D62 Acute posthemorrhagic anemia: Secondary | ICD-10-CM

## 2013-11-26 DIAGNOSIS — D509 Iron deficiency anemia, unspecified: Secondary | ICD-10-CM

## 2013-11-26 MED ORDER — SODIUM CHLORIDE 0.9 % IV SOLN
1020.0000 mg | Freq: Once | INTRAVENOUS | Status: AC
  Administered 2013-11-26: 1020 mg via INTRAVENOUS
  Filled 2013-11-26: qty 34

## 2013-11-26 MED ORDER — SODIUM CHLORIDE 0.9 % IV SOLN
INTRAVENOUS | Status: DC
  Administered 2013-11-26: 10:00:00 via INTRAVENOUS

## 2013-11-26 NOTE — Patient Instructions (Signed)

## 2013-11-26 NOTE — Progress Notes (Signed)
Chaplain made initial visit. Pt was very quiet. She stated that this is her second time here and that she is having a blood transfusion. Pt stated that she was "fine." Chaplain will follow up as necessary.

## 2013-11-29 ENCOUNTER — Telehealth: Payer: Self-pay | Admitting: *Deleted

## 2013-11-29 NOTE — Telephone Encounter (Signed)
Pt reports she had red and hot face, ears and chest on Monday night s/p IV iron infusion.  It was relieved w/ taking oral benadryl at home.  Redness and warmth on face and chest recurring this afternoon.

## 2013-11-29 NOTE — Telephone Encounter (Signed)
Rare side effects, not sure if it's related She can take benadryl 25 mg TID X 3 days and see if it would help

## 2013-11-29 NOTE — Telephone Encounter (Signed)
Informed pt of Dr. Maxine Glenn instructions below and she verbalized understanding.

## 2014-01-17 DIAGNOSIS — N949 Unspecified condition associated with female genital organs and menstrual cycle: Secondary | ICD-10-CM | POA: Diagnosis not present

## 2014-01-28 DIAGNOSIS — L02219 Cutaneous abscess of trunk, unspecified: Secondary | ICD-10-CM | POA: Diagnosis not present

## 2014-03-07 DIAGNOSIS — R109 Unspecified abdominal pain: Secondary | ICD-10-CM | POA: Diagnosis not present

## 2014-03-07 DIAGNOSIS — R197 Diarrhea, unspecified: Secondary | ICD-10-CM | POA: Diagnosis not present

## 2014-03-12 ENCOUNTER — Telehealth: Payer: Self-pay | Admitting: *Deleted

## 2014-03-12 ENCOUNTER — Other Ambulatory Visit (HOSPITAL_BASED_OUTPATIENT_CLINIC_OR_DEPARTMENT_OTHER): Payer: 59

## 2014-03-12 DIAGNOSIS — D509 Iron deficiency anemia, unspecified: Secondary | ICD-10-CM

## 2014-03-12 LAB — CBC & DIFF AND RETIC
BASO%: 0.5 % (ref 0.0–2.0)
BASOS ABS: 0 10*3/uL (ref 0.0–0.1)
EOS%: 1.8 % (ref 0.0–7.0)
Eosinophils Absolute: 0.1 10*3/uL (ref 0.0–0.5)
HEMATOCRIT: 40.4 % (ref 34.8–46.6)
HEMOGLOBIN: 13.3 g/dL (ref 11.6–15.9)
Immature Retic Fract: 5.9 % (ref 1.60–10.00)
LYMPH%: 21.4 % (ref 14.0–49.7)
MCH: 29.4 pg (ref 25.1–34.0)
MCHC: 32.9 g/dL (ref 31.5–36.0)
MCV: 89.2 fL (ref 79.5–101.0)
MONO#: 0.3 10*3/uL (ref 0.1–0.9)
MONO%: 5.4 % (ref 0.0–14.0)
NEUT#: 4.3 10*3/uL (ref 1.5–6.5)
NEUT%: 70.9 % (ref 38.4–76.8)
Platelets: 293 10*3/uL (ref 145–400)
RBC: 4.53 10*6/uL (ref 3.70–5.45)
RDW: 12.9 % (ref 11.2–14.5)
Retic %: 2.28 % — ABNORMAL HIGH (ref 0.70–2.10)
Retic Ct Abs: 103.28 10*3/uL — ABNORMAL HIGH (ref 33.70–90.70)
WBC: 6.1 10*3/uL (ref 3.9–10.3)
lymph#: 1.3 10*3/uL (ref 0.9–3.3)

## 2014-03-12 LAB — FERRITIN CHCC: Ferritin: 177 ng/ml (ref 9–269)

## 2014-03-12 LAB — IRON AND TIBC CHCC
%SAT: 34 % (ref 21–57)
Iron: 92 ug/dL (ref 41–142)
TIBC: 269 ug/dL (ref 236–444)
UIBC: 177 ug/dL (ref 120–384)

## 2014-03-12 NOTE — Telephone Encounter (Signed)
Informed pt of Dr. Calton Dach message below and to f/u w/ PCP with repeat lab work in 6 months.  She verbalized understanding.

## 2014-03-12 NOTE — Telephone Encounter (Signed)
Message copied by Cathlean Cower on Tue Mar 12, 2014  2:28 PM ------      Message from: [facility] [place]: Tue Mar 12, 2014  1:07 PM      Regarding: lab result       This patient had iron def anemia      Labs today are great      No need to return, OK to cancel appt next week      Follow-up with PCP with repeat blood check in 6 months ------

## 2014-03-14 ENCOUNTER — Ambulatory Visit: Payer: Self-pay | Admitting: Hematology and Oncology

## 2014-04-09 DIAGNOSIS — J387 Other diseases of larynx: Secondary | ICD-10-CM | POA: Diagnosis not present

## 2014-04-09 DIAGNOSIS — F411 Generalized anxiety disorder: Secondary | ICD-10-CM | POA: Diagnosis not present

## 2014-04-24 DIAGNOSIS — N949 Unspecified condition associated with female genital organs and menstrual cycle: Secondary | ICD-10-CM | POA: Diagnosis not present

## 2014-04-24 DIAGNOSIS — N920 Excessive and frequent menstruation with regular cycle: Secondary | ICD-10-CM | POA: Diagnosis not present

## 2014-05-01 ENCOUNTER — Inpatient Hospital Stay (HOSPITAL_COMMUNITY)
Admission: AD | Admit: 2014-05-01 | Discharge: 2014-05-01 | Disposition: A | Discharge status: Home / Self Care | Payer: 59 | Source: Ambulatory Visit | Attending: Obstetrics and Gynecology | Admitting: Obstetrics and Gynecology

## 2014-05-01 ENCOUNTER — Encounter (HOSPITAL_COMMUNITY): Payer: Self-pay | Admitting: *Deleted

## 2014-05-01 DIAGNOSIS — R197 Diarrhea, unspecified: Secondary | ICD-10-CM | POA: Diagnosis not present

## 2014-05-01 DIAGNOSIS — N949 Unspecified condition associated with female genital organs and menstrual cycle: Secondary | ICD-10-CM | POA: Insufficient documentation

## 2014-05-01 DIAGNOSIS — N920 Excessive and frequent menstruation with regular cycle: Secondary | ICD-10-CM | POA: Diagnosis not present

## 2014-05-01 DIAGNOSIS — F3289 Other specified depressive episodes: Secondary | ICD-10-CM | POA: Insufficient documentation

## 2014-05-01 DIAGNOSIS — N289 Disorder of kidney and ureter, unspecified: Secondary | ICD-10-CM | POA: Diagnosis not present

## 2014-05-01 DIAGNOSIS — N189 Chronic kidney disease, unspecified: Secondary | ICD-10-CM | POA: Diagnosis not present

## 2014-05-01 DIAGNOSIS — R110 Nausea: Secondary | ICD-10-CM | POA: Insufficient documentation

## 2014-05-01 DIAGNOSIS — F329 Major depressive disorder, single episode, unspecified: Secondary | ICD-10-CM | POA: Diagnosis not present

## 2014-05-01 DIAGNOSIS — R102 Pelvic and perineal pain: Secondary | ICD-10-CM

## 2014-05-01 DIAGNOSIS — R109 Unspecified abdominal pain: Secondary | ICD-10-CM | POA: Diagnosis not present

## 2014-05-01 DIAGNOSIS — N940 Mittelschmerz: Secondary | ICD-10-CM | POA: Insufficient documentation

## 2014-05-01 LAB — CBC
HCT: 40.1 % (ref 36.0–46.0)
Hemoglobin: 13.2 g/dL (ref 12.0–15.0)
MCH: 29.3 pg (ref 26.0–34.0)
MCHC: 32.9 g/dL (ref 30.0–36.0)
MCV: 89.1 fL (ref 78.0–100.0)
Platelets: 303 10*3/uL (ref 150–400)
RBC: 4.5 MIL/uL (ref 3.87–5.11)
RDW: 13 % (ref 11.5–15.5)
WBC: 7.8 10*3/uL (ref 4.0–10.5)

## 2014-05-01 LAB — URINE MICROSCOPIC-ADD ON

## 2014-05-01 LAB — URINALYSIS, ROUTINE W REFLEX MICROSCOPIC
Bilirubin Urine: NEGATIVE
Glucose, UA: NEGATIVE mg/dL
Ketones, ur: NEGATIVE mg/dL
LEUKOCYTES UA: NEGATIVE
Nitrite: NEGATIVE
Protein, ur: NEGATIVE mg/dL
Specific Gravity, Urine: 1.01 (ref 1.005–1.030)
UROBILINOGEN UA: 0.2 mg/dL (ref 0.0–1.0)
pH: 6.5 (ref 5.0–8.0)

## 2014-05-01 LAB — WET PREP, GENITAL
Clue Cells Wet Prep HPF POC: NONE SEEN
Trich, Wet Prep: NONE SEEN
Yeast Wet Prep HPF POC: NONE SEEN

## 2014-05-01 LAB — POCT PREGNANCY, URINE: PREG TEST UR: NEGATIVE

## 2014-05-01 MED ORDER — IBUPROFEN 600 MG PO TABS
600.0000 mg | ORAL_TABLET | Freq: Once | ORAL | Status: AC
  Administered 2014-05-01: 600 mg via ORAL
  Filled 2014-05-01: qty 1

## 2014-05-01 MED ORDER — DICYCLOMINE HCL 10 MG PO CAPS: 10.0000 mg | ORAL_CAPSULE | Freq: Four times a day (QID) | ORAL | Status: DC

## 2014-05-01 MED ORDER — IBUPROFEN 600 MG PO TABS: 600.0000 mg | ORAL_TABLET | Freq: Four times a day (QID) | ORAL | Status: DC | PRN

## 2014-05-01 NOTE — Discharge Instructions (Signed)
Mittelschmerz  Mittelschmerz is lower abdominal pain that happens between menstrual periods. Mittelschmerz is a [place] word that means "middle pain." It may occur right before, during, or after ovulation. It is usually felt on either the right or left side, depending on which ovary is passing the egg.  CAUSES  Pain may be felt when:  There is irritation (inflammation) inside the abdomen. This is caused by the small amount of blood or fluid that may come from releasing the egg.  The covering of the ovary stretches.  Ovarian cysts develop.  You have endometriosis. This is when the uterine lining tissue grows outside of the uterus.  You have endometriomas. These are cysts that are formed by endometrial tissue. SYMPTOMS  Pain may be:  One-sided pain unless both ovaries are ovulating at the same time. If both ovaries are ovulating, there may be pain on both sides. This pain is often repeated every month. At times, there may be a month or two with no pain.  Dull, cramping, or sharp.  Short-lived or last up to 24 to 48 hours.  Felt with bowel movements, diarrhea, or intercourse.  Accompanied by a slight amount of vaginal bleeding. DIAGNOSIS   Your caregiver will take a history and do a physical exam.  Blood tests and abdominal ultrasounds may be performed if the problem continues, becomes worse, or does not respond to the usual treatment.  A thin, lighted tube may be put into your abdomen (laparoscopy) to check for problems if the pain gets worse or does not go away. TREATMENT  Usually, no treatment is needed. If treatment is needed, it may include:  Taking over-the-counter pain relievers.  Taking birth control pills (oral contraceptives). This may be used to stop ovulation.  Medical or surgical treatment if you have endometriomas. Together, you and your caregiver can decide which course of treatment is best for you. HOME CARE INSTRUCTIONS   Only take over-the-counter or  prescription medicines for pain, discomfort, or fever as directed by your caregiver. Do not use aspirin. Aspirin may increase bleeding.  Write down when the pain comes in relation to your menstrual period. Write down how bad it is, if you have a fever with the pain, and how long it lasts. SEEK MEDICAL CARE IF:   Your pain increases and is not controlled with medicine.  Your pain is on both sides of your abdomen.  You develop vaginal bleeding (more than just spotting) with the pain.  You have a fever.  You develop nausea or vomiting.  You feel lightheaded or faint. MAKE SURE YOU:   Understand these instructions.  Will watch your condition.  Will get help right away if you are not doing well or get worse. Document Released: 12/03/2002 Document Revised: 03/06/2012 Document Reviewed: 03/12/2011 [facility] Patient Information 2014 [place], [place].  Pelvic Pain, Female Female pelvic pain can be caused by many different things and start from a variety of places. Pelvic pain refers to pain that is located in the lower half of the abdomen and between your hips. The pain may occur over a short period of time (acute) or may be reoccurring (chronic). The cause of pelvic pain may be related to disorders affecting the female reproductive organs (gynecologic), but it may also be related to the bladder, kidney stones, an intestinal complication, or muscle or skeletal problems. Getting help right away for pelvic pain is important, especially if there has been severe, sharp, or a sudden onset of unusual pain. It is also important  to get help right away because some types of pelvic pain can be life threatening.  CAUSES  Below are only some of the causes of pelvic pain. The causes of pelvic pain can be in one of several categories.   Gynecologic.  Pelvic inflammatory disease.  Sexually transmitted infection.  Ovarian cyst or a twisted ovarian ligament (ovarian torsion).  Uterine lining that grows  outside the uterus (endometriosis).  Fibroids, cysts, or tumors.  Ovulation.  Pregnancy.  Pregnancy that occurs outside the uterus (ectopic pregnancy).  Miscarriage.  Labor.  Abruption of the placenta or ruptured uterus.  Infection.  Uterine infection (endometritis).  Bladder infection.  Diverticulitis.  Miscarriage related to a uterine infection (septic abortion).  Bladder.  Inflammation of the bladder (cystitis).  Kidney stone(s).  Gastrointenstinal.  Constipation.  Diverticulitis.  Neurologic.  Trauma.  Feeling pelvic pain because of mental or emotional causes (psychosomatic).  Cancers of the bowel or pelvis. EVALUATION  Your caregiver will want to take a careful history of your concerns. This includes recent changes in your health, a careful gynecologic history of your periods (menses), and a sexual history. Obtaining your family history and medical history is also important. Your caregiver may suggest a pelvic exam. A pelvic exam will help identify the location and severity of the pain. It also helps in the evaluation of which organ system may be involved. In order to identify the cause of the pelvic pain and be properly treated, your caregiver may order tests. These tests may include:   A pregnancy test.  Pelvic ultrasonography.  An X-ray exam of the abdomen.  A urinalysis or evaluation of vaginal discharge.  Blood tests. HOME CARE INSTRUCTIONS   Only take over-the-counter or prescription medicines for pain, discomfort, or fever as directed by your caregiver.   Rest as directed by your caregiver.   Eat a balanced diet.   Drink enough fluids to make your urine clear or pale yellow, or as directed.   Avoid sexual intercourse if it causes pain.   Apply warm or cold compresses to the lower abdomen depending on which one helps the pain.   Avoid stressful situations.   Keep a journal of your pelvic pain. Write down when it started, where  the pain is located, and if there are things that seem to be associated with the pain, such as food or your menstrual cycle.  Follow up with your caregiver as directed.  SEEK MEDICAL CARE IF:  Your medicine does not help your pain.  You have abnormal vaginal discharge. SEEK IMMEDIATE MEDICAL CARE IF:   You have heavy bleeding from the vagina.   Your pelvic pain increases.   You feel lightheaded or faint.   You have chills.   You have pain with urination or blood in your urine.   You have uncontrolled diarrhea or vomiting.   You have a fever or persistent symptoms for more than 3 days.  You have a fever and your symptoms suddenly get worse.   You are being physically or sexually abused.  MAKE SURE YOU:  Understand these instructions.  Will watch your condition.  Will get help if you are not doing well or get worse. Document Released: 11/09/2004 Document Revised: 06/13/2012 Document Reviewed: 04/03/2012 [facility] Patient Information 2014 [place], [place].  Chronic Diarrhea Diarrhea is frequent loose and watery bowel movements. It can cause you to feel weak and dehydrated. Dehydration can cause you to become tired and thirsty and to have a dry mouth, decreased urination, and  dark yellow urine. Diarrhea is a sign of another problem, most often an infection that will not last long. In most cases, diarrhea lasts 2 3 days. Diarrhea that lasts longer than 4 weeks is called long-lasting (chronic) diarrhea. It is important to treat your diarrhea as directed by your health care provider to lessen or prevent future episodes of diarrhea.  CAUSES  There are many causes of chronic diarrhea. The following are some possible causes:   Gastrointestinal infections caused by viruses, bacteria, or parasites.   Food poisoning or food allergies.   Certain medicines, such as antibiotics, chemotherapy, and laxatives.   Artificial sweeteners and fructose.   Digestive disorders, such  as celiac disease and inflammatory bowel diseases.   Irritable bowel syndrome.  Some disorders of the pancreas.  Disorders of the thyroid.  Reduced blood flow to the intestines.  Cancer. Sometimes the cause of chronic diarrhea is unknown. RISK FACTORS  Having a severely weakened immune system, such as from HIV or AIDS.   Taking certain types of cancer-fighting drugs (such as with chemotherapy) or other medicines.   Having had a recent organ transplant.   Having a portion of the stomach or small bowel removed.   Traveling to countries where food and water supplies are often contaminated.  SYMPTOMS  In addition to frequent, loose stools, diarrhea may cause:   Cramping.   Abdominal pain.   Nausea.   Fever.  Fatigue.  Urgent need to use the bathroom.  Loss of bowel control. DIAGNOSIS  Your health care provider must take a careful history and perform a physical exam. Tests given are based on your symptoms and history. Tests may include:   Blood or stool tests. Three or more stool samples may be examined. Stool cultures may be used to test for bacteria or parasites.   X-rays.   A procedure in which a thin tube is inserted into the mouth or rectum (endoscopy). This allows the health care provider to look inside the intestine.  TREATMENT   Treatment is aimed at correcting the cause of the diarrhea when possible.  Diarrhea caused by an infection can often be treated with antibiotics.   Diarrhea not caused by an infection may require you to take long-term medicine or have surgery. Specific treatment should be discussed with your health care provider.   If the cause cannot be determined, treatment aims to relieve symptoms and prevent dehydration. Serious health problems can occur if you do not maintain proper fluid levels. Treatment may include:   Taking an oral rehydration solution (ORS) .  Not drinking beverages that contain caffeine (such as tea,  coffee, and soft drinks).   Not drinking alcohol.   Maintaining well-balanced nutrition to help you recover faster. HOME CARE INSTRUCTIONS   Drink enough fluids to keep urine clear or pale yellow. Drink 1 cup (8 oz) of fluid for each diarrhea episode. Avoid fluids that contain simple sugars, fruit juices, whole milk products, and sodas. Hydrate with an ORS. You may purchase the ORS or prepare it at home by mixing the following ingredients together:     tsp (1.7 3  mL) table salt.    tsp (3  mL) baking soda.    tsp (1.7 mL) salt substitute containing potassium chloride.   1 tbsp (20 mL) sugar.   4.2 c (1 L) of water.   Certain foods and beverages may increase the speed at which food moves through the gastrointestinal (GI) tract. These foods and beverages should be avoided.  They include:   Caffeinated and alcoholic beverages.   High-fiber foods, such as raw fruits and vegetables, nuts, seeds, and whole grain breads and cereals.   Foods and beverages sweetened with sugar alcohols, such as xylitol, sorbitol, and mannitol.   Some foods may be well tolerated and may help thicken stool. These include:  Starchy foods, such as rice, toast, pasta, low-sugar cereal, oatmeal, grits, baked potatoes, crackers, and bagels.   Bananas.   Applesauce.   Add probiotic-rich foods to help increase healthy bacteria in the GI tract. These include yogurt and fermented milk products.   Wash your hands well after each diarrhea episode.   Only take over-the-counter or prescription medicines as directed by your health care provider.   Take a warm bath to relieve any burning or pain from frequent diarrhea episodes. SEEK MEDICAL CARE IF:   You are not urinating as often.  Your urine is a dark color.   You become very tired or dizzy.   You have severe pain in the abdomen or rectum.   Your have blood or pus in your stools.   Your stools look black and tarry.  SEEK  IMMEDIATE MEDICAL CARE IF:   You are unable to keep fluids down.   You have persistent vomiting.   You have blood in your stool.  Your stools are black and tarry.   You do not urinate in 6 8 hours, or there is only a small amount of very dark urine.   You have abdominal pain that increases or localizes.   You have weakness, dizziness, confusion, or lightheadedness.   You have a severe headache.   Your diarrhea gets worse or does not get better.   You have a fever or persistent symptoms for more than 2 3 days.   You have a fever and your symptoms suddenly get worse.  MAKE SURE YOU:   Understand these instructions.  Will watch your condition.  Will get help right away if you are not doing well or get worse. Document Released: 03/04/2004 Document Revised: 08/15/2013 Document Reviewed: 06/07/2013 [facility] Patient Information 2014 [place].

## 2014-05-01 NOTE — MAU Note (Addendum)
abd pain started on Mon, kind of gone yesterday. Back today, some nausea and just uncomfortable.  Has had diarrhea.  Denies fever or dysurea

## 2014-05-01 NOTE — MAU Provider Note (Signed)
Chief Complaint: Abdominal Pain  First Provider Initiated Contact with Patient 05/01/14 2015     SUBJECTIVE HPI: [name] [name] is a 43 y.o. G37P2012 female who presents with low abdominal pain and nausea x3 days and diarrhea times a few weeks. Rates pain 7/10 on pain scale at worst. Has not taken anything for the pain. There are no aggravating or alleviating factors. Has been having 2-3 episodes of diarrhea per day described as orange and loose.  Feels as if she's not going to make it to the bathroom when the urge to defecate occurs. States cramping that occurs before diarrhea feels different than the low abdominal pain that started 3 days ago. Takes Imodium as soon as of diarrhea begins. Without Imodium she has approximately 5 bouts of diarrhea per day. Has had several similar episodes of diarrhea in the past with minimal evaluation done. History of colostomy and colostomy reversal.  Has been under the care of Dr. Julien Girt for menorrhagia. Bleeding controlled well with birth control pills. Saw Dr. Julien Girt one week ago for follow up appointment. Ultrasound performed in the office. Patient states she was told that her uterus looked enlarged, consistent with adenomyosis.  Patient's last menstrual period was 04/19/2014.  Past Medical History  Diagnosis Date  . Heart murmur     asa child   . Anemia     hx of   . Depression   . Anxiety   . Anemia   . Chronic kidney disease     kidney stone   . Chronic kidney disease   . Renal insufficiency   . Blood dyscrasia   . Iron deficiency anemia, unspecified 11/15/2013  . Iron deficiency anemia, unspecified 11/15/2013   OB History  Gravida Para Term Preterm AB SAB TAB Ectopic Multiple Living  3 2 2  1 1    2     # Outcome Date GA Lbr Len/2nd Weight Sex Delivery Anes PTL Lv  3 TRM 1998    F SVD EPI N Y  2 SAB 1997          1 TRM 1994    M VAC EPI N Y     Past Surgical History  Procedure Laterality Date  . Other surgical history      cyst  removed from ovary ? side   . Nephrolithotomy  03/31/2012    Procedure: NEPHROLITHOTOMY PERCUTANEOUS;  Surgeon: Claybon Jabs, MD;  Location: [facility];  Service: Urology;  Laterality: Left;  . Tubal ligation    . Laparotomy  07/31/2012    Procedure: EXPLORATORY LAPAROTOMY;  Surgeon: Harl Bowie, MD;  Location: [place];  Service: General;  Laterality: N/A;  REPAIR OF PANCREATIC INJURY, EXPLORATION OF RETROPERITONEUM.  [name] Colostomy  07/31/2012    Procedure: COLOSTOMY;  Surgeon: Harl Bowie, MD;  Location: [place];  Service: General;  Laterality: Right;  . Colostomy reversal    . Gws  2013    ABDOMINAL SURGERY  . Colostomy closure N/A 02/15/2013    Procedure: COLOSTOMY CLOSURE;  Surgeon: Gwenyth Ober, MD;  Location: [facility];  Service: General;  Laterality: N/A;  Reversal of colostomy  . Dilatation & curettage/hysteroscopy with trueclear N/A 10/24/2013    Procedure: DILATATION & CURETTAGE/HYSTEROSCOPY WITH TRUECLEAR, CERVICAL BLOCK;  Surgeon: Marylynn Pearson, MD;  Location: [facility];  Service: Gynecology;  Laterality: N/A;   History   Social History  . Marital Status: Married    Spouse Name: N/A    Number of Children: N/A  .  Years of Education: N/A   Occupational History  . Not on file.   Social History Main Topics  . Smoking status: Never Smoker   . Smokeless tobacco: Never Used  . Alcohol Use: No  . Drug Use: No  . Sexual Activity: Yes    Birth Control/ Protection: Surgical   Other Topics Concern  . Not on file   Social History Narrative   ** Merged History Encounter **       No current facility-administered medications on file prior to encounter.   Current Outpatient Prescriptions on File Prior to Encounter  Medication Sig Dispense Refill  . atropine 1 % ophthalmic solution Place 2 drops under the tongue at bedtime.       . cloZAPine (CLOZARIL) 25 MG tablet Take 175 mg by mouth at bedtime.      . hydrOXYzine (ATARAX/VISTARIL) 50 MG tablet Take 50 mg by mouth 2 (two) times  daily.       . metFORMIN (GLUCOPHAGE) 500 MG tablet Take 500 mg by mouth at bedtime.        Allergies  Allergen Reactions  . Augmentin [Amoxicillin-Pot Clavulanate] Itching, Swelling and Rash  . Avelox [Moxifloxacin Hcl In Nacl] Itching, Swelling and Rash  . Sodium Hydroxide Rash    ROS: Pertinent positive items in HPI. Negative for fever, chills, dysuria, urgency, frequency, hematuria, flank pain, intermenstrual bleeding, vaginal discharge, vomiting, constipation, bloody stools.  OBJECTIVE Blood pressure 120/95, pulse 115, temperature 98.9 F (37.2 C), temperature source Oral, resp. rate 20, height 5' 2.5" (1.588 m), weight 95.89 kg (211 lb 6.4 oz), last menstrual period 04/19/2014. Patient Vitals for the past 24 hrs:  BP Temp Temp src Pulse Resp Height Weight  05/01/14 2229 129/60 mmHg - - 100 18 - -  05/01/14 1848 120/95 mmHg 98.9 F (37.2 C) Oral 115 20 5' 2.5" (1.588 m) 95.89 kg (211 lb 6.4 oz)    GENERAL: Well-developed, well-nourished, obese female in mild distress.  HEENT: Normocephalic HEART: normal rate RESP: normal effort ABDOMEN: Soft, mild low abdominal tenderness. Negative mass or rebound tenderness. Positive bowel sounds x4. No CVA tenderness. EXTREMITIES: Nontender, no edema NEURO: Alert and oriented SPECULUM EXAM: NEFG, moderate amount of clear mucoid discharge mixed with thin, white, mildly malodorous discharge, no blood noted, cervix clean BIMANUAL: cervix closed; uterus normal size, mild left adnexal tenderness . No mass. No right adnexal tenderness or mass. No cervical motion tenderness.  LAB RESULTS Results for orders placed during the hospital encounter of 05/01/14 (from the past 24 hour(s))  URINALYSIS, ROUTINE W REFLEX MICROSCOPIC     Status: Abnormal   Collection Time    05/01/14  6:51 PM      Result Value Ref Range   Color, Urine YELLOW  YELLOW   APPearance CLEAR  CLEAR   Specific Gravity, Urine 1.010  1.005 - 1.030   pH 6.5  5.0 - 8.0   Glucose,  UA NEGATIVE  NEGATIVE mg/dL   Hgb urine dipstick TRACE (*) NEGATIVE   Bilirubin Urine NEGATIVE  NEGATIVE   Ketones, ur NEGATIVE  NEGATIVE mg/dL   Protein, ur NEGATIVE  NEGATIVE mg/dL   Urobilinogen, UA 0.2  0.0 - 1.0 mg/dL   Nitrite NEGATIVE  NEGATIVE   Leukocytes, UA NEGATIVE  NEGATIVE  URINE MICROSCOPIC-ADD ON     Status: None   Collection Time    05/01/14  6:51 PM      Result Value Ref Range   Squamous Epithelial / LPF RARE  RARE  WBC, UA 0-2  <3 WBC/hpf   RBC / HPF 0-2  <3 RBC/hpf   Bacteria, UA RARE  RARE  POCT PREGNANCY, URINE     Status: None   Collection Time    05/01/14  7:20 PM      Result Value Ref Range   Preg Test, Ur NEGATIVE  NEGATIVE  CBC     Status: None   Collection Time    05/01/14  8:19 PM      Result Value Ref Range   WBC 7.8  4.0 - 10.5 K/uL   RBC 4.50  3.87 - 5.11 MIL/uL   Hemoglobin 13.2  12.0 - 15.0 g/dL   HCT 40.1  36.0 - 46.0 %   MCV 89.1  78.0 - 100.0 fL   MCH 29.3  26.0 - 34.0 pg   MCHC 32.9  30.0 - 36.0 g/dL   RDW 13.0  11.5 - 15.5 %   Platelets 303  150 - 400 K/uL  WET PREP, GENITAL     Status: Abnormal   Collection Time    05/01/14  8:40 PM      Result Value Ref Range   Yeast Wet Prep HPF POC NONE SEEN  NONE SEEN   Trich, Wet Prep NONE SEEN  NONE SEEN   Clue Cells Wet Prep HPF POC NONE SEEN  NONE SEEN   WBC, Wet Prep HPF POC FEW (*) NONE SEEN    IMAGING N/A  MAU COURSE Discussed symptoms, labs and exam with Dr. Radene Knee. No further imaging ordered at this time. Suspect pain may be due to ovulation and or diarrhea.  Ibuprofen given.  ASSESSMENT 1. Mitt[place]chmerz   2. Acute pelvic pain, female   3. Diarrhea     PLAN Discharge home in stable condition per consult with Dr. Radene Knee. Call office in the morning to schedule followup appointment with Dr. Julien Girt. Follow up with gastroenterologist for recurrent diarrhea.     Follow-up Information   Follow up with ADKINS,GRETCHEN, MD. (Call in The morning to schedule a followup visit)     Specialty:  Obstetrics and Gynecology   Contact information:   [place], [address] [place] [address] [phone]       Follow up with [facility]. (As needed in emergencies)    Contact information:   [id number] [place] [address] [phone]       Medication List         ALTAVERA 0.15-30 MG-MCG tablet  Generic drug:  levonorgestrel-ethinyl estradiol  Take 1 tablet by mouth daily.     atropine 1 % ophthalmic solution  Place 2 drops under the tongue at bedtime.     cloZAPine 25 MG tablet  Commonly known as:  CLOZARIL  Take 175 mg by mouth at bedtime.     dicyclomine 10 MG capsule  Commonly known as:  BENTYL  Take 1 capsule (10 mg total) by mouth 4 (four) times daily.     hydrOXYzine 50 MG tablet  Commonly known as:  ATARAX/VISTARIL  Take 50 mg by mouth 2 (two) times daily.     ibuprofen 600 MG tablet  Commonly known as:  ADVIL,MOTRIN  Take 1 tablet (600 mg total) by mouth every 6 (six) hours as needed.     loperamide 2 MG capsule  Commonly known as:  IMODIUM  Take 2 mg by mouth as needed for diarrhea or loose stools.     metFORMIN 500 MG  tablet  Commonly known as:  GLUCOPHAGE  Take 500 mg by mouth at bedtime.       [place], [place] 05/01/2014  10:21 PM

## 2014-05-02 DIAGNOSIS — N949 Unspecified condition associated with female genital organs and menstrual cycle: Secondary | ICD-10-CM | POA: Diagnosis not present

## 2014-05-02 LAB — GC/CHLAMYDIA PROBE AMP
CT PROBE, AMP APTIMA: NEGATIVE
GC Probe RNA: NEGATIVE

## 2014-05-31 DIAGNOSIS — R3989 Other symptoms and signs involving the genitourinary system: Secondary | ICD-10-CM | POA: Diagnosis not present

## 2014-05-31 DIAGNOSIS — R7309 Other abnormal glucose: Secondary | ICD-10-CM | POA: Diagnosis not present

## 2014-06-20 ENCOUNTER — Encounter (HOSPITAL_COMMUNITY): Payer: Self-pay | Admitting: Emergency Medicine

## 2014-06-20 DIAGNOSIS — R197 Diarrhea, unspecified: Secondary | ICD-10-CM | POA: Diagnosis not present

## 2014-06-20 DIAGNOSIS — F29 Unspecified psychosis not due to a substance or known physiological condition: Secondary | ICD-10-CM | POA: Diagnosis not present

## 2014-06-20 DIAGNOSIS — Z79899 Other long term (current) drug therapy: Secondary | ICD-10-CM | POA: Insufficient documentation

## 2014-06-20 DIAGNOSIS — R141 Gas pain: Secondary | ICD-10-CM | POA: Diagnosis not present

## 2014-06-20 DIAGNOSIS — F209 Schizophrenia, unspecified: Secondary | ICD-10-CM | POA: Diagnosis not present

## 2014-06-20 DIAGNOSIS — R011 Cardiac murmur, unspecified: Secondary | ICD-10-CM | POA: Insufficient documentation

## 2014-06-20 DIAGNOSIS — Z933 Colostomy status: Secondary | ICD-10-CM | POA: Diagnosis not present

## 2014-06-20 DIAGNOSIS — F22 Delusional disorders: Secondary | ICD-10-CM | POA: Diagnosis not present

## 2014-06-20 DIAGNOSIS — R152 Fecal urgency: Secondary | ICD-10-CM | POA: Diagnosis not present

## 2014-06-20 DIAGNOSIS — Z8659 Personal history of other mental and behavioral disorders: Secondary | ICD-10-CM | POA: Diagnosis not present

## 2014-06-20 DIAGNOSIS — Z862 Personal history of diseases of the blood and blood-forming organs and certain disorders involving the immune mechanism: Secondary | ICD-10-CM | POA: Diagnosis not present

## 2014-06-20 DIAGNOSIS — F3289 Other specified depressive episodes: Secondary | ICD-10-CM | POA: Diagnosis not present

## 2014-06-20 DIAGNOSIS — R142 Eructation: Secondary | ICD-10-CM

## 2014-06-20 DIAGNOSIS — N189 Chronic kidney disease, unspecified: Secondary | ICD-10-CM | POA: Insufficient documentation

## 2014-06-20 DIAGNOSIS — Z87442 Personal history of urinary calculi: Secondary | ICD-10-CM | POA: Diagnosis not present

## 2014-06-20 DIAGNOSIS — Z9851 Tubal ligation status: Secondary | ICD-10-CM | POA: Insufficient documentation

## 2014-06-20 DIAGNOSIS — Z3202 Encounter for pregnancy test, result negative: Secondary | ICD-10-CM | POA: Insufficient documentation

## 2014-06-20 DIAGNOSIS — F329 Major depressive disorder, single episode, unspecified: Secondary | ICD-10-CM | POA: Diagnosis not present

## 2014-06-20 DIAGNOSIS — IMO0002 Reserved for concepts with insufficient information to code with codable children: Secondary | ICD-10-CM | POA: Diagnosis not present

## 2014-06-20 DIAGNOSIS — Z792 Long term (current) use of antibiotics: Secondary | ICD-10-CM | POA: Diagnosis not present

## 2014-06-20 DIAGNOSIS — R109 Unspecified abdominal pain: Secondary | ICD-10-CM | POA: Diagnosis present

## 2014-06-20 DIAGNOSIS — R143 Flatulence: Secondary | ICD-10-CM

## 2014-06-20 DIAGNOSIS — R198 Other specified symptoms and signs involving the digestive system and abdomen: Secondary | ICD-10-CM | POA: Diagnosis not present

## 2014-06-20 DIAGNOSIS — F411 Generalized anxiety disorder: Secondary | ICD-10-CM | POA: Insufficient documentation

## 2014-06-20 DIAGNOSIS — Z880 Allergy status to penicillin: Secondary | ICD-10-CM | POA: Diagnosis not present

## 2014-06-20 LAB — CBC WITH DIFFERENTIAL/PLATELET
Basophils Absolute: 0 10*3/uL (ref 0.0–0.1)
Basophils Relative: 1 % (ref 0–1)
EOS PCT: 2 % (ref 0–5)
Eosinophils Absolute: 0.2 10*3/uL (ref 0.0–0.7)
HCT: 39.6 % (ref 36.0–46.0)
HEMOGLOBIN: 13 g/dL (ref 12.0–15.0)
LYMPHS ABS: 2.1 10*3/uL (ref 0.7–4.0)
LYMPHS PCT: 25 % (ref 12–46)
MCH: 29 pg (ref 26.0–34.0)
MCHC: 32.8 g/dL (ref 30.0–36.0)
MCV: 88.2 fL (ref 78.0–100.0)
Monocytes Absolute: 0.8 10*3/uL (ref 0.1–1.0)
Monocytes Relative: 9 % (ref 3–12)
Neutro Abs: 5.3 10*3/uL (ref 1.7–7.7)
Neutrophils Relative %: 63 % (ref 43–77)
PLATELETS: 322 10*3/uL (ref 150–400)
RBC: 4.49 MIL/uL (ref 3.87–5.11)
RDW: 13.2 % (ref 11.5–15.5)
WBC: 8.4 10*3/uL (ref 4.0–10.5)

## 2014-06-20 LAB — BASIC METABOLIC PANEL
BUN: 10 mg/dL (ref 6–23)
CALCIUM: 9.5 mg/dL (ref 8.4–10.5)
CO2: 23 meq/L (ref 19–32)
Chloride: 101 mEq/L (ref 96–112)
Creatinine, Ser: 0.84 mg/dL (ref 0.50–1.10)
GFR calc Af Amer: >90 mL/min (ref >90)
GFR calc non Af Amer: 84 mL/min — ABNORMAL LOW (ref >90)
GLUCOSE: 109 mg/dL — AB (ref 70–99)
POTASSIUM: 4.1 meq/L (ref 3.7–5.3)
Sodium: 139 mEq/L (ref 137–147)

## 2014-06-20 NOTE — ED Notes (Signed)
Patient states she has been having diahrrea for the past several months, abd is bloated.  Became dizzy after she took her meds given by her MD

## 2014-06-20 NOTE — ED Notes (Signed)
Patient has a flat affect and does not make eye contact

## 2014-06-21 ENCOUNTER — Other Ambulatory Visit: Payer: Self-pay | Admitting: Hematology and Oncology

## 2014-06-21 ENCOUNTER — Emergency Department (HOSPITAL_COMMUNITY)
Admission: EM | Admit: 2014-06-21 | Discharge: 2014-06-21 | Disposition: A | Discharge status: Home / Self Care | Payer: 59 | Attending: Emergency Medicine | Admitting: Emergency Medicine

## 2014-06-21 DIAGNOSIS — F329 Major depressive disorder, single episode, unspecified: Secondary | ICD-10-CM | POA: Diagnosis not present

## 2014-06-21 DIAGNOSIS — R197 Diarrhea, unspecified: Secondary | ICD-10-CM | POA: Diagnosis not present

## 2014-06-21 DIAGNOSIS — F209 Schizophrenia, unspecified: Secondary | ICD-10-CM | POA: Diagnosis not present

## 2014-06-21 DIAGNOSIS — F419 Anxiety disorder, unspecified: Secondary | ICD-10-CM

## 2014-06-21 DIAGNOSIS — F3289 Other specified depressive episodes: Secondary | ICD-10-CM | POA: Diagnosis not present

## 2014-06-21 LAB — URINALYSIS, ROUTINE W REFLEX MICROSCOPIC
Bilirubin Urine: NEGATIVE
GLUCOSE, UA: NEGATIVE mg/dL
HGB URINE DIPSTICK: NEGATIVE
Ketones, ur: NEGATIVE mg/dL
Leukocytes, UA: NEGATIVE
Nitrite: NEGATIVE
Protein, ur: NEGATIVE mg/dL
SPECIFIC GRAVITY, URINE: 1.026 (ref 1.005–1.030)
Urobilinogen, UA: 0.2 mg/dL (ref 0.0–1.0)
pH: 5.5 (ref 5.0–8.0)

## 2014-06-21 LAB — PREGNANCY, URINE: PREG TEST UR: NEGATIVE

## 2014-06-21 LAB — BASIC METABOLIC PANEL
BUN: 10 mg/dL (ref 6–23)
CHLORIDE: 101 meq/L (ref 96–112)
CO2: 20 mEq/L (ref 19–32)
Calcium: 9.3 mg/dL (ref 8.4–10.5)
Creatinine, Ser: 0.82 mg/dL (ref 0.50–1.10)
GFR calc non Af Amer: 86 mL/min — ABNORMAL LOW (ref >90)
Glucose, Bld: 140 mg/dL — ABNORMAL HIGH (ref 70–99)
POTASSIUM: 3.7 meq/L (ref 3.7–5.3)
Sodium: 140 mEq/L (ref 137–147)

## 2014-06-21 LAB — I-STAT CG4 LACTIC ACID, ED
LACTIC ACID, VENOUS: 3.31 mmol/L — AB (ref 0.5–2.2)
LACTIC ACID, VENOUS: 1.69 mmol/L (ref 0.5–2.2)

## 2014-06-21 LAB — RAPID URINE DRUG SCREEN, HOSP PERFORMED
AMPHETAMINES: NOT DETECTED
BENZODIAZEPINES: NOT DETECTED
Barbiturates: NOT DETECTED
Cocaine: NOT DETECTED
OPIATES: NOT DETECTED
Tetrahydrocannabinol: NOT DETECTED

## 2014-06-21 LAB — TSH: TSH: 1.9 u[IU]/mL (ref 0.350–4.500)

## 2014-06-21 MED ORDER — ZIPRASIDONE MESYLATE 20 MG IM SOLR
10.0000 mg | Freq: Once | INTRAMUSCULAR | Status: AC
  Administered 2014-06-21: 10 mg via INTRAMUSCULAR
  Filled 2014-06-21: qty 20

## 2014-06-21 MED ORDER — SODIUM CHLORIDE 0.9 % IV BOLUS (SEPSIS): 1000.0000 mL | Freq: Once | INTRAVENOUS | Status: DC

## 2014-06-21 MED ORDER — LORAZEPAM 1 MG PO TABS: 1.0000 mg | ORAL_TABLET | Freq: Three times a day (TID) | ORAL | Status: DC | PRN

## 2014-06-21 MED ORDER — METFORMIN HCL 500 MG PO TABS: 500.0000 mg | ORAL_TABLET | Freq: Every day | ORAL | Status: DC

## 2014-06-21 MED ORDER — DICYCLOMINE HCL 10 MG PO CAPS
10.0000 mg | ORAL_CAPSULE | Freq: Four times a day (QID) | ORAL | Status: DC
  Administered 2014-06-21: 10 mg via ORAL
  Filled 2014-06-21: qty 1

## 2014-06-21 MED ORDER — CLOZAPINE 25 MG PO TABS
175.0000 mg | ORAL_TABLET | Freq: Every day | ORAL | Status: DC
  Filled 2014-06-21: qty 3

## 2014-06-21 MED ORDER — LORAZEPAM 2 MG/ML IJ SOLN
1.0000 mg | Freq: Once | INTRAMUSCULAR | Status: AC
  Administered 2014-06-21: 1 mg via INTRAVENOUS
  Filled 2014-06-21: qty 1

## 2014-06-21 MED ORDER — HYDROXYZINE HCL 25 MG PO TABS
25.0000 mg | ORAL_TABLET | Freq: Once | ORAL | Status: AC
  Administered 2014-06-21: 25 mg via ORAL
  Filled 2014-06-21: qty 1

## 2014-06-21 MED ORDER — LOPERAMIDE HCL 2 MG PO CAPS: 2.0000 mg | ORAL_CAPSULE | ORAL | Status: DC | PRN

## 2014-06-21 MED ORDER — SODIUM CHLORIDE 0.9 % IV BOLUS (SEPSIS)
1000.0000 mL | Freq: Once | INTRAVENOUS | Status: AC
  Administered 2014-06-21: 1000 mL via INTRAVENOUS

## 2014-06-21 MED ORDER — ONDANSETRON HCL 4 MG PO TABS: 4.0000 mg | ORAL_TABLET | Freq: Three times a day (TID) | ORAL | Status: DC | PRN

## 2014-06-21 MED ORDER — DICYCLOMINE HCL 10 MG/ML IM SOLN
20.0000 mg | Freq: Once | INTRAMUSCULAR | Status: AC
  Administered 2014-06-21: 20 mg via INTRAMUSCULAR
  Filled 2014-06-21: qty 2

## 2014-06-21 MED ORDER — ALUM & MAG HYDROXIDE-SIMETH 200-200-20 MG/5ML PO SUSP: 30.0000 mL | ORAL | Status: DC | PRN

## 2014-06-21 NOTE — BH Assessment (Signed)
[facility] Assessment Progress Note Called and scheduled pt's tele assessment appt for 1110 with pt's nurse, Amy.  Called EDP Lacinda Axon to inform him this clinician would be seeing the pt and to get clinical for the pt.  Shaune Pascal, MS, [facility] Licensed Professional Counselor Triage Specialist

## 2014-06-21 NOTE — ED Provider Notes (Signed)
Psych consult. Move to POD C  Nat Christen, MD 06/21/14 1114
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
## 2014-06-21 NOTE — ED Notes (Signed)
This RN called Nira Conn with ACT team per patient request. No answer. Will try again shortly.
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
## 2014-06-21 NOTE — ED Notes (Signed)
PT DAUGHTER AND MOTHER TO BEDSIDE. THEY BOTH AGREE PT BEHAVIOR IS AT BASELINE. THEY BOTH AGREE THAT PT DOES HAVE CHANGE IN BEHAVIOR WHEN SHE DOESN'T TAKE HER MEDS  AND GOES WITH OUT EATING FOR LONG PERIODS OF TIME. THEY ADVISE THEY WOULD LIKE TO TAKE PT HOME. HER ACT TEAM WILL BE AT THE HOUSE TONIGHT TO SEE PT AND GIVE HER HER MEDS. PT HAS FOLLOW UP WITH HER GI DOCTOR ON Thursday. THEY AGREE THAT PT WILL RETURN IF ANY CHANGES IN BEHAVIOR. SHE DENIES SI/HI

## 2014-06-21 NOTE — ED Notes (Signed)
Pt acting very erratic, talking to herself, and she is saying she has to pick up her daughter from school but it is almost 4am and when patient told this she gets confused and states, "well is she at the bus stop?" pt is tossing and turning in bed. Dr. Sharol Given bedside. Pt reports she has schizoaffective disorder and cant remember if she has taken her medications.

## 2014-06-21 NOTE — Progress Notes (Addendum)
CSW consult to pt regarding contacting pt.'s daughter needing a caregiver while pt is in hospital. Pt's husband is in the [employer] and is in [place]. Pt.'s mother lives locally. CSW spoke with pt.'s mother, [name] [name] [phone] who reported that she currently has pt.'s daughter. Pt reported that she currently has services with [facility] [phone] and [name] [name] [phone] is her therapist. [place] left message for therapist.   Charlene Brooke, [place]

## 2014-06-21 NOTE — Discharge Instructions (Signed)
Followup your normal mental health therapist and your gastroenterologist

## 2014-06-21 NOTE — ED Notes (Signed)
PATIENT IS PLEASANT. SHE IS COHERANT. SHE STATES THAT SHE HAS BEEN HAVING ONGOING BOWEL ISSUES AND SHE SPOKE TO HER GI DOCTOR YESTERDAY WHO SUGGESTED SHE COME TO THE ED TO HAVE IT EVALUATED. STATES SHE CAME TO ED YESTERDAY EVENING. STATES SHE MISSED HER EVENING DOSES OF MEDICATIONS AND HAD NOT EATEN SINCE YESTERDAY AT LUNCH. STATES SHE REMEMBERS THINGS "GOING HAYWIRE" THIS MORNING. STATES SHE TRIED TO CALL HER ACT TEAM. STATES THIS SOMETIMES HAPPENS WHEN SHE DOESN'T TAKE HER MEDS. AND DOESN'T EAT,. LUNCH ORDERED FOR PT

## 2014-06-21 NOTE — ED Notes (Signed)
Dr Otter at bedside  
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
## 2014-06-21 NOTE — ED Notes (Signed)
IVC paperwork had been delivered by [employer] after 0900.

## 2014-06-21 NOTE — ED Notes (Addendum)
Per Dr. Sharol Given, patient being IVC'd.   Patient belongings were taken and placed in belonging bags.  Agricultural consultant notified.  House notified.   Sitter requested.   Sitter placed until we can receive one from house.   Security called to wand.  Security at bedside to wand patient.

## 2014-06-24 DIAGNOSIS — F332 Major depressive disorder, recurrent severe without psychotic features: Secondary | ICD-10-CM | POA: Diagnosis not present

## 2014-06-24 DIAGNOSIS — R45851 Suicidal ideations: Secondary | ICD-10-CM | POA: Diagnosis not present

## 2014-06-24 DIAGNOSIS — K589 Irritable bowel syndrome without diarrhea: Secondary | ICD-10-CM | POA: Diagnosis not present

## 2014-06-24 DIAGNOSIS — F603 Borderline personality disorder: Secondary | ICD-10-CM | POA: Diagnosis present

## 2014-06-24 DIAGNOSIS — F259 Schizoaffective disorder, unspecified: Secondary | ICD-10-CM | POA: Diagnosis present

## 2014-06-24 DIAGNOSIS — F314 Bipolar disorder, current episode depressed, severe, without psychotic features: Secondary | ICD-10-CM | POA: Diagnosis present

## 2014-06-24 DIAGNOSIS — Z6836 Body mass index (BMI) 36.0-36.9, adult: Secondary | ICD-10-CM | POA: Diagnosis not present

## 2014-06-24 DIAGNOSIS — F329 Major depressive disorder, single episode, unspecified: Secondary | ICD-10-CM | POA: Diagnosis not present

## 2014-06-24 DIAGNOSIS — I10 Essential (primary) hypertension: Secondary | ICD-10-CM | POA: Diagnosis not present

## 2014-06-24 DIAGNOSIS — F3289 Other specified depressive episodes: Secondary | ICD-10-CM | POA: Diagnosis not present

## 2014-06-26 HISTORY — PX: OTHER SURGICAL HISTORY: SHX169

## 2014-06-27 DIAGNOSIS — K589 Irritable bowel syndrome without diarrhea: Secondary | ICD-10-CM | POA: Diagnosis not present

## 2014-07-15 DIAGNOSIS — Z1212 Encounter for screening for malignant neoplasm of rectum: Secondary | ICD-10-CM | POA: Diagnosis not present

## 2014-07-15 DIAGNOSIS — Z01419 Encounter for gynecological examination (general) (routine) without abnormal findings: Secondary | ICD-10-CM | POA: Diagnosis not present

## 2014-07-15 DIAGNOSIS — Z1231 Encounter for screening mammogram for malignant neoplasm of breast: Secondary | ICD-10-CM | POA: Diagnosis not present

## 2014-07-18 DIAGNOSIS — F259 Schizoaffective disorder, unspecified: Secondary | ICD-10-CM | POA: Diagnosis not present

## 2014-07-18 DIAGNOSIS — R21 Rash and other nonspecific skin eruption: Secondary | ICD-10-CM | POA: Diagnosis not present

## 2014-07-30 DIAGNOSIS — F259 Schizoaffective disorder, unspecified: Secondary | ICD-10-CM | POA: Diagnosis not present

## 2014-08-06 DIAGNOSIS — J9600 Acute respiratory failure, unspecified whether with hypoxia or hypercapnia: Secondary | ICD-10-CM | POA: Diagnosis present

## 2014-08-06 DIAGNOSIS — Z781 Physical restraint status: Secondary | ICD-10-CM | POA: Diagnosis not present

## 2014-08-06 DIAGNOSIS — Z87442 Personal history of urinary calculi: Secondary | ICD-10-CM | POA: Diagnosis not present

## 2014-08-06 DIAGNOSIS — Z86010 Personal history of colonic polyps: Secondary | ICD-10-CM | POA: Diagnosis not present

## 2014-08-06 DIAGNOSIS — Z87828 Personal history of other (healed) physical injury and trauma: Secondary | ICD-10-CM | POA: Diagnosis not present

## 2014-08-06 DIAGNOSIS — F39 Unspecified mood [affective] disorder: Secondary | ICD-10-CM | POA: Diagnosis present

## 2014-08-06 DIAGNOSIS — E876 Hypokalemia: Secondary | ICD-10-CM | POA: Diagnosis not present

## 2014-08-06 DIAGNOSIS — F411 Generalized anxiety disorder: Secondary | ICD-10-CM | POA: Diagnosis present

## 2014-08-06 DIAGNOSIS — F489 Nonpsychotic mental disorder, unspecified: Secondary | ICD-10-CM | POA: Diagnosis not present

## 2014-08-06 DIAGNOSIS — R7309 Other abnormal glucose: Secondary | ICD-10-CM | POA: Diagnosis not present

## 2014-08-06 DIAGNOSIS — I498 Other specified cardiac arrhythmias: Secondary | ICD-10-CM | POA: Diagnosis not present

## 2014-08-06 DIAGNOSIS — Z880 Allergy status to penicillin: Secondary | ICD-10-CM | POA: Diagnosis not present

## 2014-08-06 DIAGNOSIS — F329 Major depressive disorder, single episode, unspecified: Secondary | ICD-10-CM | POA: Diagnosis not present

## 2014-08-06 DIAGNOSIS — R7402 Elevation of levels of lactic acid dehydrogenase (LDH): Secondary | ICD-10-CM | POA: Diagnosis present

## 2014-08-06 DIAGNOSIS — H5316 Psychophysical visual disturbances: Secondary | ICD-10-CM | POA: Diagnosis present

## 2014-08-06 DIAGNOSIS — F259 Schizoaffective disorder, unspecified: Secondary | ICD-10-CM | POA: Diagnosis not present

## 2014-08-06 DIAGNOSIS — G479 Sleep disorder, unspecified: Secondary | ICD-10-CM | POA: Diagnosis not present

## 2014-08-06 DIAGNOSIS — N390 Urinary tract infection, site not specified: Secondary | ICD-10-CM | POA: Diagnosis not present

## 2014-08-06 DIAGNOSIS — T450X4A Poisoning by antiallergic and antiemetic drugs, undetermined, initial encounter: Secondary | ICD-10-CM | POA: Diagnosis not present

## 2014-08-06 DIAGNOSIS — F603 Borderline personality disorder: Secondary | ICD-10-CM | POA: Diagnosis not present

## 2014-08-06 DIAGNOSIS — F22 Delusional disorders: Secondary | ICD-10-CM | POA: Diagnosis present

## 2014-08-06 DIAGNOSIS — F17200 Nicotine dependence, unspecified, uncomplicated: Secondary | ICD-10-CM | POA: Diagnosis present

## 2014-08-06 DIAGNOSIS — T50901A Poisoning by unspecified drugs, medicaments and biological substances, accidental (unintentional), initial encounter: Secondary | ICD-10-CM | POA: Diagnosis not present

## 2014-08-06 DIAGNOSIS — Z888 Allergy status to other drugs, medicaments and biological substances status: Secondary | ICD-10-CM | POA: Diagnosis not present

## 2014-08-06 DIAGNOSIS — B961 Klebsiella pneumoniae [K. pneumoniae] as the cause of diseases classified elsewhere: Secondary | ICD-10-CM | POA: Diagnosis present

## 2014-08-06 DIAGNOSIS — R9431 Abnormal electrocardiogram [ECG] [EKG]: Secondary | ICD-10-CM | POA: Diagnosis not present

## 2014-08-06 DIAGNOSIS — F319 Bipolar disorder, unspecified: Secondary | ICD-10-CM | POA: Diagnosis present

## 2014-08-06 DIAGNOSIS — F05 Delirium due to known physiological condition: Secondary | ICD-10-CM | POA: Diagnosis present

## 2014-08-16 DIAGNOSIS — F3189 Other bipolar disorder: Secondary | ICD-10-CM | POA: Diagnosis not present

## 2014-08-17 DIAGNOSIS — F3189 Other bipolar disorder: Secondary | ICD-10-CM | POA: Diagnosis not present

## 2014-08-18 DIAGNOSIS — F3189 Other bipolar disorder: Secondary | ICD-10-CM | POA: Diagnosis not present

## 2014-08-19 DIAGNOSIS — F3189 Other bipolar disorder: Secondary | ICD-10-CM | POA: Diagnosis not present

## 2014-08-20 DIAGNOSIS — F3189 Other bipolar disorder: Secondary | ICD-10-CM | POA: Diagnosis not present

## 2014-08-21 DIAGNOSIS — F3189 Other bipolar disorder: Secondary | ICD-10-CM | POA: Diagnosis not present

## 2014-08-22 DIAGNOSIS — F3189 Other bipolar disorder: Secondary | ICD-10-CM | POA: Diagnosis not present

## 2014-08-23 DIAGNOSIS — F332 Major depressive disorder, recurrent severe without psychotic features: Secondary | ICD-10-CM | POA: Insufficient documentation

## 2014-08-23 DIAGNOSIS — F3189 Other bipolar disorder: Secondary | ICD-10-CM | POA: Diagnosis not present

## 2014-08-27 DIAGNOSIS — F259 Schizoaffective disorder, unspecified: Secondary | ICD-10-CM | POA: Diagnosis not present

## 2014-08-27 DIAGNOSIS — E119 Type 2 diabetes mellitus without complications: Secondary | ICD-10-CM | POA: Diagnosis present

## 2014-08-27 DIAGNOSIS — F315 Bipolar disorder, current episode depressed, severe, with psychotic features: Secondary | ICD-10-CM | POA: Diagnosis present

## 2014-08-27 DIAGNOSIS — F603 Borderline personality disorder: Secondary | ICD-10-CM | POA: Diagnosis present

## 2014-08-27 DIAGNOSIS — Z560 Unemployment, unspecified: Secondary | ICD-10-CM | POA: Diagnosis not present

## 2014-08-27 DIAGNOSIS — Z598 Other problems related to housing and economic circumstances: Secondary | ICD-10-CM | POA: Diagnosis not present

## 2014-08-27 DIAGNOSIS — K219 Gastro-esophageal reflux disease without esophagitis: Secondary | ICD-10-CM | POA: Diagnosis present

## 2014-08-27 DIAGNOSIS — R45851 Suicidal ideations: Secondary | ICD-10-CM | POA: Diagnosis not present

## 2014-08-27 DIAGNOSIS — Z5987 Material hardship: Secondary | ICD-10-CM | POA: Diagnosis not present

## 2014-08-27 DIAGNOSIS — Z733 Stress, not elsewhere classified: Secondary | ICD-10-CM | POA: Diagnosis not present

## 2014-08-28 ENCOUNTER — Ambulatory Visit: Payer: Self-pay | Admitting: Dietician

## 2014-09-03 DIAGNOSIS — I10 Essential (primary) hypertension: Secondary | ICD-10-CM | POA: Diagnosis present

## 2014-09-03 DIAGNOSIS — F603 Borderline personality disorder: Secondary | ICD-10-CM | POA: Diagnosis present

## 2014-09-03 DIAGNOSIS — F259 Schizoaffective disorder, unspecified: Secondary | ICD-10-CM | POA: Diagnosis not present

## 2014-09-03 DIAGNOSIS — F4310 Post-traumatic stress disorder, unspecified: Secondary | ICD-10-CM | POA: Diagnosis not present

## 2014-09-03 DIAGNOSIS — Z880 Allergy status to penicillin: Secondary | ICD-10-CM | POA: Diagnosis not present

## 2014-09-03 DIAGNOSIS — F319 Bipolar disorder, unspecified: Secondary | ICD-10-CM | POA: Diagnosis present

## 2014-09-03 DIAGNOSIS — R45851 Suicidal ideations: Secondary | ICD-10-CM | POA: Diagnosis not present

## 2014-09-03 DIAGNOSIS — F5000 Anorexia nervosa, unspecified: Secondary | ICD-10-CM | POA: Diagnosis present

## 2014-09-03 DIAGNOSIS — F411 Generalized anxiety disorder: Secondary | ICD-10-CM | POA: Diagnosis not present

## 2014-09-03 DIAGNOSIS — IMO0002 Reserved for concepts with insufficient information to code with codable children: Secondary | ICD-10-CM | POA: Diagnosis not present

## 2014-09-26 DIAGNOSIS — N920 Excessive and frequent menstruation with regular cycle: Secondary | ICD-10-CM | POA: Diagnosis not present

## 2014-09-30 ENCOUNTER — Emergency Department (HOSPITAL_COMMUNITY): Payer: 59

## 2014-09-30 ENCOUNTER — Emergency Department (HOSPITAL_COMMUNITY)
Admission: EM | Admit: 2014-09-30 | Discharge: 2014-09-30 | Disposition: A | Discharge status: Home / Self Care | Payer: 59 | Attending: Emergency Medicine | Admitting: Emergency Medicine

## 2014-09-30 ENCOUNTER — Encounter (HOSPITAL_COMMUNITY): Payer: Self-pay | Admitting: Emergency Medicine

## 2014-09-30 DIAGNOSIS — F419 Anxiety disorder, unspecified: Secondary | ICD-10-CM | POA: Diagnosis not present

## 2014-09-30 DIAGNOSIS — Z79899 Other long term (current) drug therapy: Secondary | ICD-10-CM | POA: Insufficient documentation

## 2014-09-30 DIAGNOSIS — R0789 Other chest pain: Secondary | ICD-10-CM

## 2014-09-30 DIAGNOSIS — Z87442 Personal history of urinary calculi: Secondary | ICD-10-CM | POA: Insufficient documentation

## 2014-09-30 DIAGNOSIS — R011 Cardiac murmur, unspecified: Secondary | ICD-10-CM | POA: Diagnosis not present

## 2014-09-30 DIAGNOSIS — F259 Schizoaffective disorder, unspecified: Secondary | ICD-10-CM | POA: Diagnosis not present

## 2014-09-30 DIAGNOSIS — R0602 Shortness of breath: Secondary | ICD-10-CM | POA: Diagnosis not present

## 2014-09-30 DIAGNOSIS — F329 Major depressive disorder, single episode, unspecified: Secondary | ICD-10-CM | POA: Diagnosis not present

## 2014-09-30 DIAGNOSIS — N189 Chronic kidney disease, unspecified: Secondary | ICD-10-CM | POA: Diagnosis not present

## 2014-09-30 DIAGNOSIS — R079 Chest pain, unspecified: Secondary | ICD-10-CM | POA: Diagnosis present

## 2014-09-30 DIAGNOSIS — Z862 Personal history of diseases of the blood and blood-forming organs and certain disorders involving the immune mechanism: Secondary | ICD-10-CM | POA: Insufficient documentation

## 2014-09-30 LAB — CBC
HCT: 41.5 % (ref 36.0–46.0)
Hemoglobin: 13.3 g/dL (ref 12.0–15.0)
MCH: 28.9 pg (ref 26.0–34.0)
MCHC: 32 g/dL (ref 30.0–36.0)
MCV: 90 fL (ref 78.0–100.0)
PLATELETS: 367 10*3/uL (ref 150–400)
RBC: 4.61 MIL/uL (ref 3.87–5.11)
RDW: 13.8 % (ref 11.5–15.5)
WBC: 7.1 10*3/uL (ref 4.0–10.5)

## 2014-09-30 LAB — HEPATIC FUNCTION PANEL
ALK PHOS: 179 U/L — AB (ref 39–117)
ALT: 58 U/L — AB (ref 0–35)
AST: 43 U/L — ABNORMAL HIGH (ref 0–37)
Albumin: 3.8 g/dL (ref 3.5–5.2)
Total Bilirubin: 0.3 mg/dL (ref 0.3–1.2)
Total Protein: 7.1 g/dL (ref 6.0–8.3)

## 2014-09-30 LAB — I-STAT TROPONIN, ED: TROPONIN I, POC: 0 ng/mL (ref 0.00–0.08)

## 2014-09-30 LAB — BASIC METABOLIC PANEL
ANION GAP: 13 (ref 5–15)
BUN: 11 mg/dL (ref 6–23)
CHLORIDE: 103 meq/L (ref 96–112)
CO2: 23 mEq/L (ref 19–32)
Calcium: 9.6 mg/dL (ref 8.4–10.5)
Creatinine, Ser: 0.96 mg/dL (ref 0.50–1.10)
GFR, EST AFRICAN AMERICAN: 83 mL/min — AB (ref >90)
GFR, EST NON AFRICAN AMERICAN: 71 mL/min — AB (ref >90)
Glucose, Bld: 126 mg/dL — ABNORMAL HIGH (ref 70–99)
POTASSIUM: 4.5 meq/L (ref 3.7–5.3)
SODIUM: 139 meq/L (ref 137–147)

## 2014-09-30 LAB — URINALYSIS, ROUTINE W REFLEX MICROSCOPIC
Bilirubin Urine: NEGATIVE
GLUCOSE, UA: NEGATIVE mg/dL
HGB URINE DIPSTICK: NEGATIVE
KETONES UR: NEGATIVE mg/dL
LEUKOCYTES UA: NEGATIVE
Nitrite: NEGATIVE
Protein, ur: NEGATIVE mg/dL
Specific Gravity, Urine: 1.013 (ref 1.005–1.030)
Urobilinogen, UA: 0.2 mg/dL (ref 0.0–1.0)
pH: 6.5 (ref 5.0–8.0)

## 2014-09-30 LAB — PRO B NATRIURETIC PEPTIDE: Pro B Natriuretic peptide (BNP): 12.8 pg/mL (ref 0–125)

## 2014-09-30 LAB — D-DIMER, QUANTITATIVE: D-Dimer, Quant: 0.39 ug/mL-FEU (ref 0.00–0.48)

## 2014-09-30 LAB — LIPASE, BLOOD: Lipase: 31 U/L (ref 11–59)

## 2014-09-30 LAB — TROPONIN I: Troponin I: <0.3 ng/mL (ref <0.30)

## 2014-09-30 MED ORDER — SODIUM CHLORIDE 0.9 % IV BOLUS (SEPSIS)
1000.0000 mL | Freq: Once | INTRAVENOUS | Status: AC
  Administered 2014-09-30: 1000 mL via INTRAVENOUS

## 2014-09-30 NOTE — ED Notes (Addendum)
Pt c/o right chest pain that started after lunch today and c/o difficulty swallowing that started after breakfast. Pt also c/o SOB. Pt has hx of anxiety.. Pt NAD.

## 2014-09-30 NOTE — ED Notes (Signed)
US at bedside

## 2014-09-30 NOTE — ED Provider Notes (Signed)
CSN: [id number]     Arrival date & time 09/30/14  1745 History   First MD Initiated Contact with Patient 09/30/14 1959     Chief Complaint  Patient presents with  . Chest Pain  . Dysphagia     (Consider location/radiation/quality/duration/timing/severity/associated sxs/prior Treatment) Patient is a 43 y.o. female presenting with chest pain.  Chest Pain Pain location:  Substernal area Pain quality: pressure   Pain radiates to:  R arm Pain radiates to the back: no   Pain severity:  Moderate Onset quality:  Gradual Duration:  5 minutes Timing:  Constant Progression:  Resolved Chronicity:  New Context: at rest   Context: not breathing   Relieved by:  Nothing Worsened by:  Nothing tried Associated symptoms: anxiety and shortness of breath   Associated symptoms: no abdominal pain, no cough, no fever, no nausea, no syncope and not vomiting   Associated symptoms comment:  Difficulty swallowing   Past Medical History  Diagnosis Date  . Heart murmur     asa child   . Anemia     hx of   . Depression   . Anxiety   . Anemia   . Chronic kidney disease     kidney stone   . Chronic kidney disease   . Renal insufficiency   . Blood dyscrasia   . Iron deficiency anemia, unspecified 11/15/2013  . Iron deficiency anemia, unspecified 11/15/2013   Past Surgical History  Procedure Laterality Date  . Other surgical history      cyst removed from ovary ? side   . Nephrolithotomy  03/31/2012    Procedure: NEPHROLITHOTOMY PERCUTANEOUS;  Surgeon: Claybon Jabs, MD;  Location: [facility];  Service: Urology;  Laterality: Left;  . Tubal ligation    . Laparotomy  07/31/2012    Procedure: EXPLORATORY LAPAROTOMY;  Surgeon: Harl Bowie, MD;  Location: [place];  Service: General;  Laterality: N/A;  REPAIR OF PANCREATIC INJURY, EXPLORATION OF RETROPERITONEUM.  [name] Colostomy  07/31/2012    Procedure: COLOSTOMY;  Surgeon: Harl Bowie, MD;  Location: [place];  Service: General;  Laterality: Right;   . Colostomy reversal    . Gws  2013    ABDOMINAL SURGERY  . Colostomy closure N/A 02/15/2013    Procedure: COLOSTOMY CLOSURE;  Surgeon: Gwenyth Ober, MD;  Location: [place];  Service: General;  Laterality: N/A;  Reversal of colostomy  . Dilatation & curettage/hysteroscopy with trueclear N/A 10/24/2013    Procedure: DILATATION & CURETTAGE/HYSTEROSCOPY WITH TRUECLEAR, CERVICAL BLOCK;  Surgeon: Marylynn Pearson, MD;  Location: [name];  Service: Gynecology;  Laterality: N/A;   Family History  Problem Relation Age of Onset  . Cancer Mother     kidney   History  Substance Use Topics  . Smoking status: Never Smoker   . Smokeless tobacco: Never Used  . Alcohol Use: No   OB History   Grav Para Term Preterm Abortions TAB SAB Ect Mult Living   '3 2 2  1  1   2     ' Review of Systems  Constitutional: Negative for fever.  Respiratory: Positive for shortness of breath. Negative for cough.   Cardiovascular: Positive for chest pain. Negative for syncope.  Gastrointestinal: Negative for nausea, vomiting and abdominal pain.  All other systems reviewed and are negative.     Allergies  Augmentin; Avelox; and Sodium hydroxide  Home Medications   Prior to Admission medications   Medication Sig Start Date End Date Taking? Authorizing Provider  atropine  1 % ophthalmic solution Place 2 drops under the tongue 2 (two) times daily.   Yes Historical Provider, MD  cloZAPine (CLOZARIL) 25 MG tablet Take 100 mg by mouth 2 (two) times daily.    Yes Historical Provider, MD  FLUoxetine (PROZAC) 20 MG capsule Take 20 mg by mouth daily.   Yes Historical Provider, MD  hydrOXYzine (ATARAX/VISTARIL) 50 MG tablet Take 50 mg by mouth 2 (two) times daily.   Yes Historical Provider, MD  metFORMIN (GLUCOPHAGE) 500 MG tablet Take 500-1,000 mg by mouth 2 (two) times daily with a meal. 105m in the am and 5044min the pm 04/27/13  Yes Historical Provider, MD  dicyclomine (BENTYL) 10 MG capsule Take 1 capsule (10 mg total)  by mouth 4 (four) times daily. 05/01/14 05/15/14  ViManya SilvasCNM   BP 136/80  Pulse 105  Temp(Src) 98 F (36.7 C) (Oral)  Resp 22  SpO2 98%  LMP 09/22/2014 Physical Exam  Vitals reviewed. Constitutional: She is oriented to person, place, and time. She appears well-developed and well-nourished.  HENT:  Head: Normocephalic and atraumatic.  Right Ear: External ear normal.  Left Ear: External ear normal.  Eyes: Conjunctivae and EOM are normal. Pupils are equal, round, and reactive to light.  Neck: Normal range of motion. Neck supple.  Cardiovascular: Normal rate, regular rhythm, normal heart sounds and intact distal pulses.   Pulmonary/Chest: Effort normal and breath sounds normal.  Abdominal: Soft. Bowel sounds are normal. There is no tenderness.  Musculoskeletal: Normal range of motion.  Neurological: She is alert and oriented to person, place, and time.  Skin: Skin is warm and dry.    ED Course  Procedures (including critical care time) Labs Review Labs Reviewed  BASIC METABOLIC PANEL - Abnormal; Notable for the following:    Glucose, Bld 126 (*)    GFR calc non Af Amer 71 (*)    GFR calc Af Amer 83 (*)    All other components within normal limits  HEPATIC FUNCTION PANEL - Abnormal; Notable for the following:    AST 43 (*)    ALT 58 (*)    Alkaline Phosphatase 179 (*)    All other components within normal limits  CBC  PRO B NATRIURETIC PEPTIDE  TROPONIN I  LIPASE, BLOOD  URINALYSIS, ROUTINE W REFLEX MICROSCOPIC  D-DIMER, QUANTITATIVE  I-STAT TROPOININ, ED    Imaging Review Dg Chest 2 View  09/30/2014   CLINICAL DATA:  Chest and right arm pain.  EXAM: CHEST  2 VIEW  COMPARISON:  08/22/2013.  FINDINGS: Normal sized heart. Clear lungs with normal vascularity. Minimal thoracic spine degenerative changes.  IMPRESSION: No acute abnormality.   Electronically Signed   By: StEnrique Sack.D.   On: 09/30/2014 19:18   UsKoreabdomen Limited  09/30/2014   CLINICAL DATA:  Acute  onset of right upper quadrant abdominal pain and epigastric abdominal pain, with diffuse abdominal swelling. Initial encounter.  EXAM: US[place]oreaBDOMEN LIMITED - RIGHT UPPER QUADRANT  COMPARISON:  CT of the abdomen and pelvis performed 02/27/2013  FINDINGS: Gallbladder:  No gallstones or wall thickening visualized. No sonographic Murphy sign noted.  Common bile duct:  Diameter: 0.5 cm, within normal limits in caliber.  Liver:  No focal lesion identified. Mildly heterogeneous echotexture noted, raising concern for mild fatty infiltration.  A trace right pleural effusion is noted.  IMPRESSION: 1. No acute abnormality seen within the right upper quadrant. 2. Suggestion of mild fatty infiltration within the liver. 3. Trace right  pleural effusion noted.   Electronically Signed   By: Garald Balding M.D.   On: 09/30/2014 22:04     EKG Interpretation None      MDM   Final diagnoses:  Other chest pain    43 y.o. female with pertinent PMH of schizoaffective do, anxiety presents with chest pain as described above. Patient primarily describes difficult swallowing over the past couple day. She had substernal pressure that radiated down her right arm that lasted for approximately 5 minutes greater than 6 hours prior to arrival. On arrival today vitals signs and physical exam as above, and no abnormalities. Patient is asymptomatic, my exam. She denies fever, respiratory symptoms the exception of dyspnea, GI symptoms. Her description of pain with the exception of pressure was atypical for ACS. She is low risk for ACS by history. She is also low risk via [place], however cannot perc due to tachycardia on initial EKG.  Labs and imaging obtained as above.  This was unremarkable including negative ddimer, however did show elevated alk phos.  US unremarkable. Pt asymptomatic throughout my stay, history not consistent with pancreatitis, cholecystitis, or emergent abd pathology.  Doubt ACS given clinical history, suspect likely  anxiety reaction. Patient was given standard return precautions for chest pain and dysphagia, voiced understanding and agreed to followup.  Doubt foreign body impaction given PO tolerance and nature of symptoms.    1. Other chest pain         Debby Freiberg, MD 10/01/14 [phone]

## 2014-09-30 NOTE — Discharge Instructions (Signed)

## 2014-10-02 ENCOUNTER — Encounter: Payer: 59 | Attending: Family Medicine | Admitting: Dietician

## 2014-10-02 ENCOUNTER — Encounter: Payer: Self-pay | Admitting: Dietician

## 2014-10-02 VITALS — Ht 64.0 in | Wt 198.7 lb

## 2014-10-02 DIAGNOSIS — Z713 Dietary counseling and surveillance: Secondary | ICD-10-CM | POA: Diagnosis not present

## 2014-10-02 DIAGNOSIS — Z6834 Body mass index (BMI) 34.0-34.9, adult: Secondary | ICD-10-CM | POA: Diagnosis not present

## 2014-10-02 DIAGNOSIS — E669 Obesity, unspecified: Secondary | ICD-10-CM | POA: Diagnosis not present

## 2014-10-02 NOTE — Progress Notes (Signed)
  Medical Nutrition Therapy:  Appt start time: 9179 end time:  0935.  Assessment:  Primary concerns today: Jayle is here today since she is taking Clozapine and she is hungry all time. She is working with an ACT team of doctors and therapists who are helping with medication management since she has schizoaffective disorder. She is taking Metformin to try to counteract her feelings of hunger, which she thinks is not really helping. She has a hx of anorexia 15 years ago but states that she does not currently have an eating disorder.  She has gained about 55 lbs in the past 2 years since starting the medication. She is having a really large appetite. She has tried to limit some portions and walking her dog 2 x day for 1 mile each. She weighed 207 lbs at her doctor's visit in July and today is 198.7. Had quit the clozapine for a short time and started back 3 weeks ago.  She living with her mom and she is on disability . Both her and her mom do the food shopping and meal preparation. She sometimes will skip lunch and eats out about 3-4 x week (usually breakfast). Feels hungrier in the afternoon.   Preferred Learning Style:   No preference indicated   Learning Readiness:   Ready  MEDICATIONS: clozapine and metformin   DIETARY INTAKE:  Usual eating pattern includes 3 meals and 1 snacks per day.  Avoided foods include: salmon, cream of wheat, sweets, eggs  24-hr recall:  B ( AM): oatmeal or Honey bunches of oats with skim milk   Snk ( AM): none  L ( PM): ham or [place] sandwich or soup Snk ( PM): banana or apple or crackers (Cheezits) D ( PM): "[place] food" or [place], vegetable soup Snk ( PM): none Beverages: 2 bottles of regular soda, water  Usual physical activity: walks 2 miles per day (80 minutes each day)  Estimated energy needs: 1800 calories 200 g carbohydrates 135 g protein 50 g fat  Progress Towards Goal(s):  In progress.   Nutritional Diagnosis:  [place]-3.4 Unintentional  weight gain As related to clozapine which is causing increased hunger.  As evidenced by 55 lb weight gain in past 2 years (BMI of 34.1).    Intervention:  Nutrition counseling provided. Plan: Try to drink more water - aim for 50 oz of water each day. Drink some water if you are feeling really hungry. Or have diet soda, unsweet tea with splenda, or any water flavoring packets/drops. Think about using a small bowl for cereal in the morning and a small plate for dinner at night. If you are hungry mid morning, have a snack with protein and carbohydrate (banana with yogurt or peanut butter). Think about keeping raw vegetables around to snack on whenever you feel hungry.  Fill half of your plate at lunch and dinner with vegetables. (frozen or pre-washed salad greens). Think about cutting restaurant portions in half to save for another meal.  Try eating at the table with no TV or other distraction.  Try whole wheat pasta.   Teaching Method Utilized:  Visual Auditory Hands on  Handouts given during visit include:  MyPlate Handout  15 g CHO Snacks  Barriers to learning/adherence to lifestyle change: feeling very hungry  Demonstrated degree of understanding via:  Teach Back   Monitoring/Evaluation:  Dietary intake, exercise, and body weight prn.

## 2014-10-02 NOTE — Patient Instructions (Addendum)
Try to drink more water - aim for 50 oz of water each day. Drink some water if you are feeling really hungry. Or have diet soda, unsweet tea with splenda, or any water flavoring packets/drops. Think about using a small bowl for cereal in the morning and a small plate for dinner at night. If you are hungry mid morning, have a snack with protein and carbohydrate (banana with yogurt or peanut butter). Think about keeping raw vegetables around to snack on whenever you feel hungry.  Fill half of your plate at lunch and dinner with vegetables. (frozen or pre-washed salad greens). Think about cutting restaurant portions in half to save for another meal.  Try eating at the table with no TV or other distraction.  Try whole wheat pasta.

## 2014-10-03 ENCOUNTER — Other Ambulatory Visit: Payer: Self-pay | Admitting: Family Medicine

## 2014-10-03 DIAGNOSIS — R1012 Left upper quadrant pain: Secondary | ICD-10-CM

## 2014-10-04 ENCOUNTER — Ambulatory Visit
Admission: RE | Admit: 2014-10-04 | Discharge: 2014-10-04 | Disposition: A | Discharge status: Home / Self Care | Payer: 59 | Source: Ambulatory Visit | Attending: Family Medicine | Admitting: Family Medicine

## 2014-10-04 DIAGNOSIS — R1012 Left upper quadrant pain: Secondary | ICD-10-CM

## 2014-10-04 MED ORDER — IOHEXOL 300 MG/ML  SOLN
100.0000 mL | Freq: Once | INTRAMUSCULAR | Status: AC | PRN
  Administered 2014-10-04: 100 mL via INTRAVENOUS

## 2014-10-28 ENCOUNTER — Encounter: Payer: Self-pay | Admitting: Dietician

## 2014-12-01 DIAGNOSIS — J209 Acute bronchitis, unspecified: Secondary | ICD-10-CM | POA: Diagnosis not present

## 2014-12-13 DIAGNOSIS — F315 Bipolar disorder, current episode depressed, severe, with psychotic features: Secondary | ICD-10-CM | POA: Diagnosis not present

## 2014-12-23 DIAGNOSIS — F315 Bipolar disorder, current episode depressed, severe, with psychotic features: Secondary | ICD-10-CM | POA: Diagnosis not present

## 2014-12-30 DIAGNOSIS — F315 Bipolar disorder, current episode depressed, severe, with psychotic features: Secondary | ICD-10-CM | POA: Diagnosis not present

## 2015-01-06 DIAGNOSIS — F315 Bipolar disorder, current episode depressed, severe, with psychotic features: Secondary | ICD-10-CM | POA: Diagnosis not present

## 2015-01-06 DIAGNOSIS — E559 Vitamin D deficiency, unspecified: Secondary | ICD-10-CM | POA: Diagnosis not present

## 2015-01-09 DIAGNOSIS — E559 Vitamin D deficiency, unspecified: Secondary | ICD-10-CM | POA: Diagnosis not present

## 2015-01-13 DIAGNOSIS — F315 Bipolar disorder, current episode depressed, severe, with psychotic features: Secondary | ICD-10-CM | POA: Diagnosis not present

## 2015-01-13 DIAGNOSIS — E559 Vitamin D deficiency, unspecified: Secondary | ICD-10-CM | POA: Diagnosis not present

## 2015-01-15 DIAGNOSIS — F251 Schizoaffective disorder, depressive type: Secondary | ICD-10-CM | POA: Diagnosis not present

## 2015-01-20 DIAGNOSIS — F315 Bipolar disorder, current episode depressed, severe, with psychotic features: Secondary | ICD-10-CM | POA: Diagnosis not present

## 2015-01-27 DIAGNOSIS — F315 Bipolar disorder, current episode depressed, severe, with psychotic features: Secondary | ICD-10-CM | POA: Diagnosis not present

## 2015-02-04 DIAGNOSIS — F315 Bipolar disorder, current episode depressed, severe, with psychotic features: Secondary | ICD-10-CM | POA: Diagnosis not present

## 2015-02-11 DIAGNOSIS — F315 Bipolar disorder, current episode depressed, severe, with psychotic features: Secondary | ICD-10-CM | POA: Diagnosis not present

## 2015-02-17 DIAGNOSIS — R635 Abnormal weight gain: Secondary | ICD-10-CM | POA: Diagnosis not present

## 2015-02-17 DIAGNOSIS — E669 Obesity, unspecified: Secondary | ICD-10-CM | POA: Diagnosis not present

## 2015-02-17 DIAGNOSIS — Z713 Dietary counseling and surveillance: Secondary | ICD-10-CM | POA: Diagnosis not present

## 2015-02-17 DIAGNOSIS — Z6837 Body mass index (BMI) 37.0-37.9, adult: Secondary | ICD-10-CM | POA: Diagnosis not present

## 2015-02-18 DIAGNOSIS — F315 Bipolar disorder, current episode depressed, severe, with psychotic features: Secondary | ICD-10-CM | POA: Diagnosis not present

## 2015-02-24 ENCOUNTER — Encounter: Payer: Self-pay | Admitting: Internal Medicine

## 2015-02-25 DIAGNOSIS — F315 Bipolar disorder, current episode depressed, severe, with psychotic features: Secondary | ICD-10-CM | POA: Diagnosis not present

## 2015-03-04 DIAGNOSIS — F315 Bipolar disorder, current episode depressed, severe, with psychotic features: Secondary | ICD-10-CM | POA: Diagnosis not present

## 2015-03-06 ENCOUNTER — Encounter: Payer: Self-pay | Admitting: Endocrinology

## 2015-03-06 ENCOUNTER — Ambulatory Visit (INDEPENDENT_AMBULATORY_CARE_PROVIDER_SITE_OTHER): Payer: Medicare Other | Admitting: Endocrinology

## 2015-03-06 VITALS — BP 112/60 | HR 105 | Temp 98.4°F | Resp 17 | Ht 64.0 in | Wt 211.2 lb

## 2015-03-06 DIAGNOSIS — R232 Flushing: Secondary | ICD-10-CM | POA: Diagnosis not present

## 2015-03-06 DIAGNOSIS — R635 Abnormal weight gain: Secondary | ICD-10-CM | POA: Diagnosis not present

## 2015-03-06 MED ORDER — DEXAMETHASONE 1 MG PO TABS: ORAL_TABLET | ORAL | Status: DC
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
## 2015-03-06 NOTE — Patient Instructions (Addendum)
you should do a "dexamethasone suppression test."  for this, you would take dexamethasone 1 mg at 10 pm (i have sent a prescription to your pharmacy), then come in for a "cortisol" blood test the next morning before 9 am.  you do not need to be fasting for this test.  Here is some information about weight-loss surgery.  Please register for a meeting if you want to consider further. Also, please check a 24-HR urine collection for adrenaline.  Here is another paper to give to the lab at [place] I would be happy to see you back here whenever you want.

## 2015-03-07 ENCOUNTER — Telehealth: Payer: Self-pay | Admitting: Endocrinology

## 2015-03-07 DIAGNOSIS — R635 Abnormal weight gain: Secondary | ICD-10-CM | POA: Diagnosis not present

## 2015-03-07 NOTE — Telephone Encounter (Signed)
please call patient: Blood test is normal

## 2015-03-07 NOTE — Telephone Encounter (Signed)
Patient is aware 

## 2015-03-10 DIAGNOSIS — R232 Flushing: Secondary | ICD-10-CM | POA: Diagnosis not present

## 2015-03-11 DIAGNOSIS — F315 Bipolar disorder, current episode depressed, severe, with psychotic features: Secondary | ICD-10-CM | POA: Diagnosis not present

## 2015-03-12 ENCOUNTER — Ambulatory Visit: Payer: Self-pay | Admitting: Endocrinology

## 2015-03-12 DIAGNOSIS — N200 Calculus of kidney: Secondary | ICD-10-CM | POA: Diagnosis not present

## 2015-03-12 DIAGNOSIS — R3989 Other symptoms and signs involving the genitourinary system: Secondary | ICD-10-CM | POA: Diagnosis not present

## 2015-03-14 ENCOUNTER — Telehealth: Payer: Self-pay | Admitting: Endocrinology

## 2015-03-14 NOTE — Telephone Encounter (Signed)
please call patient: Urine test is normal i hope you feel well.

## 2015-03-14 NOTE — Telephone Encounter (Signed)
Called and spoke with pt and pt is aware.  

## 2015-03-18 DIAGNOSIS — F315 Bipolar disorder, current episode depressed, severe, with psychotic features: Secondary | ICD-10-CM | POA: Diagnosis not present

## 2015-03-19 DIAGNOSIS — F251 Schizoaffective disorder, depressive type: Secondary | ICD-10-CM | POA: Diagnosis not present

## 2015-03-25 DIAGNOSIS — Z79899 Other long term (current) drug therapy: Secondary | ICD-10-CM | POA: Diagnosis not present

## 2015-04-01 DIAGNOSIS — Z79899 Other long term (current) drug therapy: Secondary | ICD-10-CM | POA: Diagnosis not present

## 2015-04-08 DIAGNOSIS — Z79899 Other long term (current) drug therapy: Secondary | ICD-10-CM | POA: Diagnosis not present

## 2015-04-15 DIAGNOSIS — Z79899 Other long term (current) drug therapy: Secondary | ICD-10-CM | POA: Diagnosis not present

## 2015-04-16 ENCOUNTER — Encounter: Payer: Self-pay | Admitting: Endocrinology

## 2015-04-18 ENCOUNTER — Encounter: Payer: Self-pay | Admitting: Endocrinology

## 2015-04-22 DIAGNOSIS — Z79899 Other long term (current) drug therapy: Secondary | ICD-10-CM | POA: Diagnosis not present

## 2015-04-29 DIAGNOSIS — Z79899 Other long term (current) drug therapy: Secondary | ICD-10-CM | POA: Diagnosis not present

## 2015-05-06 DIAGNOSIS — Z79899 Other long term (current) drug therapy: Secondary | ICD-10-CM | POA: Diagnosis not present

## 2015-05-08 DIAGNOSIS — F251 Schizoaffective disorder, depressive type: Secondary | ICD-10-CM | POA: Diagnosis not present

## 2015-05-20 DIAGNOSIS — Z79899 Other long term (current) drug therapy: Secondary | ICD-10-CM | POA: Diagnosis not present

## 2015-05-29 DIAGNOSIS — R000 Tachycardia, unspecified: Secondary | ICD-10-CM | POA: Diagnosis not present

## 2015-05-29 DIAGNOSIS — Z79899 Other long term (current) drug therapy: Secondary | ICD-10-CM | POA: Diagnosis not present

## 2015-05-29 DIAGNOSIS — F251 Schizoaffective disorder, depressive type: Secondary | ICD-10-CM | POA: Diagnosis not present

## 2015-05-29 DIAGNOSIS — R002 Palpitations: Secondary | ICD-10-CM | POA: Diagnosis not present

## 2015-05-30 ENCOUNTER — Emergency Department (HOSPITAL_COMMUNITY)
Admission: EM | Admit: 2015-05-30 | Discharge: 2015-05-31 | Disposition: A | Discharge status: Home / Self Care | Payer: Medicare Other | Attending: Emergency Medicine | Admitting: Emergency Medicine

## 2015-05-30 ENCOUNTER — Encounter (HOSPITAL_COMMUNITY): Payer: Self-pay

## 2015-05-30 DIAGNOSIS — R011 Cardiac murmur, unspecified: Secondary | ICD-10-CM | POA: Diagnosis not present

## 2015-05-30 DIAGNOSIS — N189 Chronic kidney disease, unspecified: Secondary | ICD-10-CM | POA: Insufficient documentation

## 2015-05-30 DIAGNOSIS — Z862 Personal history of diseases of the blood and blood-forming organs and certain disorders involving the immune mechanism: Secondary | ICD-10-CM | POA: Insufficient documentation

## 2015-05-30 DIAGNOSIS — Z79899 Other long term (current) drug therapy: Secondary | ICD-10-CM | POA: Diagnosis not present

## 2015-05-30 DIAGNOSIS — F329 Major depressive disorder, single episode, unspecified: Secondary | ICD-10-CM | POA: Insufficient documentation

## 2015-05-30 DIAGNOSIS — F603 Borderline personality disorder: Secondary | ICD-10-CM | POA: Insufficient documentation

## 2015-05-30 DIAGNOSIS — Z7952 Long term (current) use of systemic steroids: Secondary | ICD-10-CM | POA: Diagnosis not present

## 2015-05-30 DIAGNOSIS — F259 Schizoaffective disorder, unspecified: Secondary | ICD-10-CM | POA: Diagnosis not present

## 2015-05-30 DIAGNOSIS — F419 Anxiety disorder, unspecified: Secondary | ICD-10-CM | POA: Diagnosis not present

## 2015-05-30 DIAGNOSIS — R40241 Glasgow coma scale score 13-15: Secondary | ICD-10-CM | POA: Diagnosis not present

## 2015-05-30 DIAGNOSIS — R739 Hyperglycemia, unspecified: Secondary | ICD-10-CM | POA: Diagnosis not present

## 2015-05-30 DIAGNOSIS — R4182 Altered mental status, unspecified: Secondary | ICD-10-CM | POA: Diagnosis present

## 2015-05-30 DIAGNOSIS — F0630 Mood disorder due to known physiological condition, unspecified: Secondary | ICD-10-CM | POA: Diagnosis not present

## 2015-05-30 DIAGNOSIS — Z87442 Personal history of urinary calculi: Secondary | ICD-10-CM | POA: Diagnosis not present

## 2015-05-30 DIAGNOSIS — R000 Tachycardia, unspecified: Secondary | ICD-10-CM | POA: Diagnosis not present

## 2015-05-30 LAB — I-STAT ARTERIAL BLOOD GAS, ED
ACID-BASE DEFICIT: 2 mmol/L (ref 0.0–2.0)
BICARBONATE: 21.2 meq/L (ref 20.0–24.0)
O2 SAT: 98 %
PCO2 ART: 30.9 mmHg — AB (ref 35.0–45.0)
TCO2: 22 mmol/L (ref 0–100)
pH, Arterial: 7.445 (ref 7.350–7.450)
pO2, Arterial: 104 mmHg — ABNORMAL HIGH (ref 80.0–100.0)

## 2015-05-30 LAB — RAPID URINE DRUG SCREEN, HOSP PERFORMED
Amphetamines: NOT DETECTED
BARBITURATES: NOT DETECTED
Benzodiazepines: NOT DETECTED
COCAINE: NOT DETECTED
Opiates: NOT DETECTED
TETRAHYDROCANNABINOL: NOT DETECTED

## 2015-05-30 LAB — I-STAT CG4 LACTIC ACID, ED: LACTIC ACID, VENOUS: 1.45 mmol/L (ref 0.5–2.0)

## 2015-05-30 LAB — CBC WITH DIFFERENTIAL/PLATELET
Basophils Absolute: 0.1 10*3/uL (ref 0.0–0.1)
Basophils Relative: 1 % (ref 0–1)
EOS ABS: 0 10*3/uL (ref 0.0–0.7)
EOS PCT: 0 % (ref 0–5)
HEMATOCRIT: 40.8 % (ref 36.0–46.0)
Hemoglobin: 13.2 g/dL (ref 12.0–15.0)
LYMPHS PCT: 19 % (ref 12–46)
Lymphs Abs: 1.9 10*3/uL (ref 0.7–4.0)
MCH: 27.9 pg (ref 26.0–34.0)
MCHC: 32.4 g/dL (ref 30.0–36.0)
MCV: 86.3 fL (ref 78.0–100.0)
MONOS PCT: 6 % (ref 3–12)
Monocytes Absolute: 0.6 10*3/uL (ref 0.1–1.0)
NEUTROS ABS: 7.6 10*3/uL (ref 1.7–7.7)
Neutrophils Relative %: 74 % (ref 43–77)
PLATELETS: 331 10*3/uL (ref 150–400)
RBC: 4.73 MIL/uL (ref 3.87–5.11)
RDW: 13.3 % (ref 11.5–15.5)
WBC: 10.2 10*3/uL (ref 4.0–10.5)

## 2015-05-30 LAB — HEPATIC FUNCTION PANEL
ALT: 27 U/L (ref 14–54)
AST: 33 U/L (ref 15–41)
Albumin: 3.7 g/dL (ref 3.5–5.0)
Alkaline Phosphatase: 97 U/L (ref 38–126)
BILIRUBIN TOTAL: 0.4 mg/dL (ref 0.3–1.2)
Bilirubin, Direct: 0.1 mg/dL (ref 0.1–0.5)
Indirect Bilirubin: 0.3 mg/dL (ref 0.3–0.9)
TOTAL PROTEIN: 6.2 g/dL — AB (ref 6.5–8.1)

## 2015-05-30 LAB — BASIC METABOLIC PANEL
ANION GAP: 9 (ref 5–15)
BUN: 11 mg/dL (ref 6–20)
CHLORIDE: 106 mmol/L (ref 101–111)
CO2: 24 mmol/L (ref 22–32)
Calcium: 9.5 mg/dL (ref 8.9–10.3)
Creatinine, Ser: 1.01 mg/dL — ABNORMAL HIGH (ref 0.44–1.00)
GFR calc Af Amer: >60 mL/min (ref >60)
GFR calc non Af Amer: >60 mL/min (ref >60)
Glucose, Bld: 125 mg/dL — ABNORMAL HIGH (ref 65–99)
Potassium: 4 mmol/L (ref 3.5–5.1)
Sodium: 139 mmol/L (ref 135–145)

## 2015-05-30 LAB — AMMONIA: AMMONIA: 24 umol/L (ref 9–35)

## 2015-05-30 LAB — LIPASE, BLOOD: LIPASE: 23 U/L (ref 22–51)

## 2015-05-30 LAB — CBG MONITORING, ED: Glucose-Capillary: 136 mg/dL — ABNORMAL HIGH (ref 65–99)

## 2015-05-30 MED ORDER — LORAZEPAM 1 MG PO TABS
1.0000 mg | ORAL_TABLET | Freq: Once | ORAL | Status: AC
  Administered 2015-05-30: 1 mg via ORAL
  Filled 2015-05-30: qty 1

## 2015-05-30 MED ORDER — NICOTINE 21 MG/24HR TD PT24: 21.0000 mg | MEDICATED_PATCH | Freq: Every day | TRANSDERMAL | Status: DC

## 2015-05-30 MED ORDER — CLOZAPINE 100 MG PO TABS
100.0000 mg | ORAL_TABLET | Freq: Once | ORAL | Status: AC
  Administered 2015-05-30: 100 mg via ORAL
  Filled 2015-05-30: qty 1

## 2015-05-30 MED ORDER — HYDROXYZINE HCL 25 MG PO TABS
25.0000 mg | ORAL_TABLET | Freq: Once | ORAL | Status: AC
  Administered 2015-05-30: 25 mg via ORAL
  Filled 2015-05-30: qty 1

## 2015-05-30 MED ORDER — ONDANSETRON HCL 4 MG PO TABS
4.0000 mg | ORAL_TABLET | Freq: Three times a day (TID) | ORAL | Status: DC | PRN
  Administered 2015-05-31: 4 mg via ORAL
  Filled 2015-05-30: qty 1

## 2015-05-30 MED ORDER — ACETAMINOPHEN 325 MG PO TABS
650.0000 mg | ORAL_TABLET | ORAL | Status: DC | PRN
  Administered 2015-05-31: 650 mg via ORAL
  Filled 2015-05-30: qty 2

## 2015-05-30 MED ORDER — SODIUM CHLORIDE 0.9 % IV BOLUS (SEPSIS)
1000.0000 mL | Freq: Once | INTRAVENOUS | Status: AC
  Administered 2015-05-30: 1000 mL via INTRAVENOUS

## 2015-05-30 NOTE — ED Provider Notes (Signed)
CSN: [id number]     Arrival date & time 05/30/15  1910 History   First MD Initiated Contact with Patient 05/30/15 1910     Chief Complaint  Patient presents with  . Altered Mental Status     (Consider location/radiation/quality/duration/timing/severity/associated sxs/prior Treatment) Patient is a 44 y.o. female presenting with altered mental status.  Altered Mental Status Presenting symptoms: behavior changes   Severity:  Moderate Most recent episode:  Today Episode history:  Single Duration: several hours. Timing:  Constant Progression:  Resolved Chronicity:  Recurrent Context: not alcohol use, not head injury, not homeless, not a recent change in medication, not a recent illness and not a recent infection   Context comment:  Pt with h/o schizoaffective disorder on clozapine, prozac, and atarax. She did not take her medications today as she needed to fast for blood work by PCP.  Associated symptoms: abnormal movement and hallucinations (baseline per pt)   Associated symptoms: no abdominal pain, no agitation, no bladder incontinence, no decreased appetite, no depression, no difficulty breathing, no eye deviation, no fever, no headaches, no light-headedness, no nausea, no palpitations, no rash, no seizures, no slurred speech, no suicidal behavior, no visual change, no vomiting and no weakness     Past Medical History  Diagnosis Date  . Heart murmur     asa child   . Anemia     hx of   . Depression   . Anxiety   . Anemia   . Chronic kidney disease     kidney stone   . Chronic kidney disease   . Renal insufficiency   . Blood dyscrasia   . Iron deficiency anemia, unspecified 11/15/2013  . Iron deficiency anemia, unspecified 11/15/2013   Past Surgical History  Procedure Laterality Date  . Other surgical history      cyst removed from ovary ? side   . Nephrolithotomy  03/31/2012    Procedure: NEPHROLITHOTOMY PERCUTANEOUS;  Surgeon: Claybon Jabs, MD;  Location: [facility];   Service: Urology;  Laterality: Left;  . Tubal ligation    . Laparotomy  07/31/2012    Procedure: EXPLORATORY LAPAROTOMY;  Surgeon: Harl Bowie, MD;  Location: [facility];  Service: General;  Laterality: N/A;  REPAIR OF PANCREATIC INJURY, EXPLORATION OF RETROPERITONEUM.  [name] Colostomy  07/31/2012    Procedure: COLOSTOMY;  Surgeon: Harl Bowie, MD;  Location: [place];  Service: General;  Laterality: Right;  . Colostomy reversal    . Gws  2013    ABDOMINAL SURGERY  . Colostomy closure N/A 02/15/2013    Procedure: COLOSTOMY CLOSURE;  Surgeon: Gwenyth Ober, MD;  Location: [facility];  Service: General;  Laterality: N/A;  Reversal of colostomy  . Dilatation & curettage/hysteroscopy with trueclear N/A 10/24/2013    Procedure: DILATATION & CURETTAGE/HYSTEROSCOPY WITH TRUECLEAR, CERVICAL BLOCK;  Surgeon: Marylynn Pearson, MD;  Location: [facility];  Service: Gynecology;  Laterality: N/A;  . Breast reduction  06/2014   Family History  Problem Relation Age of Onset  . Cancer Mother     kidney  . Other Neg Hx     adrenal problem   History  Substance Use Topics  . Smoking status: Never Smoker   . Smokeless tobacco: Never Used  . Alcohol Use: No   OB History    Gravida Para Term Preterm AB TAB SAB Ectopic Multiple Living   3 2 2  1  1   2      Review of Systems  Constitutional: Negative for fever,  chills, appetite change, fatigue and decreased appetite.  HENT: Negative for congestion, ear pain, facial swelling, mouth sores and sore throat.   Eyes: Negative for visual disturbance.  Respiratory: Negative for cough, chest tightness and shortness of breath.   Cardiovascular: Negative for chest pain and palpitations.  Gastrointestinal: Negative for nausea, vomiting, abdominal pain, diarrhea and blood in stool.  Endocrine: Negative for cold intolerance and heat intolerance.  Genitourinary: Negative for bladder incontinence, frequency, decreased urine volume and difficulty urinating.  Musculoskeletal:  Negative for back pain and neck stiffness.  Skin: Negative for rash.  Neurological: Negative for dizziness, seizures, weakness, light-headedness and headaches.  Psychiatric/Behavioral: Positive for hallucinations (baseline per pt). Negative for agitation.  All other systems reviewed and are negative.     Allergies  Enoxaparin; Augmentin; Avelox; and Sodium hydroxide  Home Medications   Prior to Admission medications   Medication Sig Start Date End Date Taking? Authorizing Provider  atropine 1 % ophthalmic solution Place 2 drops under the tongue 2 (two) times daily.    Historical Provider, MD  cloZAPine (CLOZARIL) 25 MG tablet Take 100 mg by mouth 2 (two) times daily.     Historical Provider, MD  dexamethasone (DECADRON) 1 MG tablet Take at 9-10 pm, the night before blood test 03/06/15   Renato Shin, MD  FLUoxetine (PROZAC) 20 MG capsule Take 20 mg by mouth daily.    Historical Provider, MD  hydrOXYzine (ATARAX/VISTARIL) 50 MG tablet Take 50 mg by mouth 2 (two) times daily.    Historical Provider, MD  Melatonin 3 MG TABS Take 6 mg by mouth. 09/18/14   Historical Provider, MD  traZODone (DESYREL) 100 MG tablet Take 100 mg by mouth. 09/18/14   Historical Provider, MD   BP 115/98 mmHg  Pulse 86  Temp(Src) 98.5 F (36.9 C) (Oral)  Resp 20  SpO2 94% Physical Exam  Constitutional: She is oriented to person, place, and time. She appears well-developed and well-nourished. No distress.  HENT:  Head: Normocephalic and atraumatic.  Right Ear: External ear normal.  Left Ear: External ear normal.  Nose: Nose normal.  Eyes: Conjunctivae and EOM are normal. Pupils are equal, round, and reactive to light. Right eye exhibits no discharge. Left eye exhibits no discharge. No scleral icterus.  Neck: Normal range of motion. Neck supple.  Cardiovascular: Normal rate, regular rhythm and normal heart sounds.  Exam reveals no gallop and no friction rub.   No murmur heard. Pulmonary/Chest: Effort normal  and breath sounds normal. No stridor. No respiratory distress. She has no wheezes.  Abdominal: Soft. She exhibits no distension. There is no tenderness.  Musculoskeletal: She exhibits no edema or tenderness.  Neurological: She is alert and oriented to person, place, and time.  Skin: Skin is warm and dry. No rash noted. She is not diaphoretic. No erythema.  Psychiatric: Her mood appears anxious. Her speech is delayed. She is slowed and actively hallucinating. She expresses no homicidal and no suicidal ideation.  Patient with random movements of all extremities that resolved when patient distracted.    ED Course  Procedures (including critical care time) Labs Review Labs Reviewed  BASIC METABOLIC PANEL - Abnormal; Notable for the following:    Glucose, Bld 125 (*)    Creatinine, Ser 1.01 (*)    All other components within normal limits  HEPATIC FUNCTION PANEL - Abnormal; Notable for the following:    Total Protein 6.2 (*)    All other components within normal limits  CBG MONITORING, ED - Abnormal;  Notable for the following:    Glucose-Capillary 136 (*)    All other components within normal limits  I-STAT ARTERIAL BLOOD GAS, ED - Abnormal; Notable for the following:    pCO2 arterial 30.9 (*)    pO2, Arterial 104.0 (*)    All other components within normal limits  CBC WITH DIFFERENTIAL/PLATELET  AMMONIA  LIPASE, BLOOD  URINE RAPID DRUG SCREEN (HOSP PERFORMED) NOT AT [facility]  CARBOXYHEMOGLOBIN  I-STAT CG4 LACTIC ACID, ED  I-STAT CG4 LACTIC ACID, ED    Imaging Review No results found.   EKG Interpretation None      MDM   Final diagnoses:  None    44 year old female with a history of depression, schizoaffective disorder, and borderline personality presents after being found in her vehicle by clinic staff several hours after her clinic visit. Per EMS report the patient was found in her vehicle and the car was turned off. The patient was alert however not responding. EMS was  contacted on arrival of the patient and his glucose was within normal limits and vital signs were stable. On arrival here the patient was hemodynamically stable, alert, and oriented 3. The patient's with abnormal movements of all extremities that resolved when distracted. She is withdrawn with questioning. Patient does report that she has not had anything he eats all day and has not taken her psychiatric medication. She has no memory of her time in the car. She currently denies any homicidal ideations, suicidal ideations. She does endorse borderline hallucinations but states that they are not maleficent. Glucose serum was within normal limits. Screening labs were nonspecific. The mother arrived and reported that the patient has had multiple episodes like this in the past, including her losing track of time. The mother reports that this occurs whenever she misses doses of her medication. This is not occurred for several months as the mother make sure the patient takes her medication as scheduled.  Presentation is most consistent with her missing her doses of medication. There is no evidence to suggest hypoglycemia, seizures, CVA, and congestion. We will however obtain CT of her head. We will also like the patient to be evaluated by psychiatry. Appropriate orders were placed. Patient has agreed with plan to stay and be assessed by a psychiatrist.  Patient signed out to Dr. Venora Maples. If CT head is negative this bowl will be dependent upon psychiatry recommendations.  Patient seen in conjunction with Dr. Wyvonnia Dusky.  Sibyl Parr, MD Resident    Addison Lank, MD 05/31/15 [id number]  Ezequiel Essex, MD 05/31/15 [phone]

## 2015-05-30 NOTE — ED Notes (Signed)
Pt was found by this nurse dressed in the hallway trying to leave.  This nurse talked with pt and pt agreed to wait in room for results and a plan of care to develop.  MD made aware that pt wanted to leave.  Per MD okay to give pt sandwich.  MD stated pt does not need to be IVC'ed at this time.

## 2015-05-30 NOTE — ED Notes (Signed)
Pt unable to be still, exhibits constant small involuntary movements.

## 2015-05-30 NOTE — ED Notes (Signed)
Pt states "[place]" is telling her to leave.  Pt appears to be listening to voices and agitation is more pronounced.

## 2015-05-30 NOTE — ED Notes (Signed)
Pt. Was seen for Dr. Appointment today to check A1C at 1600. Pt. Was found around 1820 in her car by staff, staff states she was not responding but awake. Pt found to be diaphoretic. Pt. Was seen at [facility] yesterday for palpitations and started on lopressor.

## 2015-05-31 DIAGNOSIS — F603 Borderline personality disorder: Secondary | ICD-10-CM | POA: Diagnosis not present

## 2015-05-31 LAB — I-STAT CG4 LACTIC ACID, ED: LACTIC ACID, VENOUS: 1.77 mmol/L (ref 0.5–2.0)

## 2015-05-31 MED ORDER — CLOZAPINE 100 MG PO TABS
100.0000 mg | ORAL_TABLET | Freq: Two times a day (BID) | ORAL | Status: DC
  Administered 2015-05-31: 100 mg via ORAL

## 2015-05-31 MED ORDER — HYDROXYZINE HCL 25 MG PO TABS: 50.0000 mg | ORAL_TABLET | Freq: Two times a day (BID) | ORAL | Status: DC

## 2015-05-31 MED ORDER — FLUOXETINE HCL 20 MG PO CAPS: 20.0000 mg | ORAL_CAPSULE | Freq: Every day | ORAL | Status: DC

## 2015-05-31 MED ORDER — TRAZODONE HCL 100 MG PO TABS: 100.0000 mg | ORAL_TABLET | Freq: Every day | ORAL | Status: DC

## 2015-05-31 MED ORDER — MELATONIN 3 MG PO TABS: 6.0000 mg | ORAL_TABLET | Freq: Every evening | ORAL | Status: DC
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
## 2015-05-31 NOTE — BH Assessment (Addendum)
Assessment completed. Consulted Arlester Marker, NP who agrees that pt does not meet inpatient criteria. It is recommend that pt follow up with her ACTT team. Dr. Venora Maples has been informed of the recommendation.

## 2015-05-31 NOTE — ED Provider Notes (Signed)
1:41 AM Seen by TTS. Does not meet inpatient criteria. Dc home with family.   Jola Schmidt, MD 05/31/15 [phone]

## 2015-06-01 DIAGNOSIS — F209 Schizophrenia, unspecified: Secondary | ICD-10-CM | POA: Diagnosis not present

## 2015-06-01 DIAGNOSIS — R45851 Suicidal ideations: Secondary | ICD-10-CM | POA: Diagnosis present

## 2015-06-01 DIAGNOSIS — F4481 Dissociative identity disorder: Secondary | ICD-10-CM | POA: Diagnosis present

## 2015-06-01 DIAGNOSIS — F333 Major depressive disorder, recurrent, severe with psychotic symptoms: Secondary | ICD-10-CM | POA: Diagnosis present

## 2015-06-01 DIAGNOSIS — E785 Hyperlipidemia, unspecified: Secondary | ICD-10-CM | POA: Diagnosis present

## 2015-06-11 DIAGNOSIS — Z79899 Other long term (current) drug therapy: Secondary | ICD-10-CM | POA: Diagnosis not present

## 2015-06-17 DIAGNOSIS — R000 Tachycardia, unspecified: Secondary | ICD-10-CM | POA: Diagnosis not present

## 2015-06-17 DIAGNOSIS — R002 Palpitations: Secondary | ICD-10-CM | POA: Diagnosis not present

## 2015-06-17 DIAGNOSIS — R0600 Dyspnea, unspecified: Secondary | ICD-10-CM | POA: Diagnosis not present

## 2015-06-18 DIAGNOSIS — F251 Schizoaffective disorder, depressive type: Secondary | ICD-10-CM | POA: Diagnosis not present

## 2015-06-18 DIAGNOSIS — Z79899 Other long term (current) drug therapy: Secondary | ICD-10-CM | POA: Diagnosis not present

## 2015-06-20 DIAGNOSIS — R0602 Shortness of breath: Secondary | ICD-10-CM | POA: Diagnosis not present

## 2015-06-20 DIAGNOSIS — I517 Cardiomegaly: Secondary | ICD-10-CM | POA: Diagnosis not present

## 2015-06-20 DIAGNOSIS — I501 Left ventricular failure: Secondary | ICD-10-CM | POA: Diagnosis not present

## 2015-06-20 DIAGNOSIS — R0600 Dyspnea, unspecified: Secondary | ICD-10-CM | POA: Diagnosis not present

## 2015-06-20 DIAGNOSIS — R002 Palpitations: Secondary | ICD-10-CM | POA: Diagnosis not present

## 2015-06-20 DIAGNOSIS — R000 Tachycardia, unspecified: Secondary | ICD-10-CM | POA: Diagnosis not present

## 2015-06-24 DIAGNOSIS — R002 Palpitations: Secondary | ICD-10-CM | POA: Diagnosis not present

## 2015-06-25 DIAGNOSIS — Z79899 Other long term (current) drug therapy: Secondary | ICD-10-CM | POA: Diagnosis not present

## 2015-06-26 DIAGNOSIS — R002 Palpitations: Secondary | ICD-10-CM | POA: Diagnosis not present

## 2015-07-02 DIAGNOSIS — Z79899 Other long term (current) drug therapy: Secondary | ICD-10-CM | POA: Diagnosis not present

## 2015-07-16 DIAGNOSIS — Z79899 Other long term (current) drug therapy: Secondary | ICD-10-CM | POA: Diagnosis not present

## 2015-07-16 DIAGNOSIS — R300 Dysuria: Secondary | ICD-10-CM | POA: Diagnosis not present

## 2015-07-29 ENCOUNTER — Other Ambulatory Visit: Payer: Self-pay | Admitting: Obstetrics and Gynecology

## 2015-07-29 DIAGNOSIS — Z1231 Encounter for screening mammogram for malignant neoplasm of breast: Secondary | ICD-10-CM | POA: Diagnosis not present

## 2015-07-29 DIAGNOSIS — Z79899 Other long term (current) drug therapy: Secondary | ICD-10-CM | POA: Diagnosis not present

## 2015-07-29 DIAGNOSIS — Z6837 Body mass index (BMI) 37.0-37.9, adult: Secondary | ICD-10-CM | POA: Diagnosis not present

## 2015-07-29 DIAGNOSIS — Z124 Encounter for screening for malignant neoplasm of cervix: Secondary | ICD-10-CM | POA: Diagnosis not present

## 2015-07-30 LAB — CYTOLOGY - PAP

## 2015-08-09 DIAGNOSIS — I10 Essential (primary) hypertension: Secondary | ICD-10-CM | POA: Diagnosis present

## 2015-08-09 DIAGNOSIS — E119 Type 2 diabetes mellitus without complications: Secondary | ICD-10-CM | POA: Diagnosis present

## 2015-08-09 DIAGNOSIS — F209 Schizophrenia, unspecified: Secondary | ICD-10-CM | POA: Diagnosis not present

## 2015-08-09 DIAGNOSIS — R45851 Suicidal ideations: Secondary | ICD-10-CM | POA: Diagnosis present

## 2015-08-09 DIAGNOSIS — E785 Hyperlipidemia, unspecified: Secondary | ICD-10-CM | POA: Diagnosis present

## 2015-08-09 DIAGNOSIS — R109 Unspecified abdominal pain: Secondary | ICD-10-CM | POA: Diagnosis not present

## 2015-08-09 DIAGNOSIS — N390 Urinary tract infection, site not specified: Secondary | ICD-10-CM | POA: Diagnosis present

## 2015-08-09 DIAGNOSIS — F250 Schizoaffective disorder, bipolar type: Secondary | ICD-10-CM | POA: Diagnosis present

## 2015-08-09 DIAGNOSIS — K219 Gastro-esophageal reflux disease without esophagitis: Secondary | ICD-10-CM | POA: Diagnosis present

## 2015-08-20 DIAGNOSIS — Z79899 Other long term (current) drug therapy: Secondary | ICD-10-CM | POA: Diagnosis not present

## 2015-09-01 DIAGNOSIS — M86171 Other acute osteomyelitis, right ankle and foot: Secondary | ICD-10-CM | POA: Diagnosis not present

## 2015-09-01 DIAGNOSIS — M86172 Other acute osteomyelitis, left ankle and foot: Secondary | ICD-10-CM | POA: Diagnosis not present

## 2015-09-01 DIAGNOSIS — R609 Edema, unspecified: Secondary | ICD-10-CM | POA: Diagnosis not present

## 2015-09-02 DIAGNOSIS — Z79899 Other long term (current) drug therapy: Secondary | ICD-10-CM | POA: Diagnosis not present

## 2015-09-05 ENCOUNTER — Encounter (HOSPITAL_COMMUNITY): Payer: Self-pay | Admitting: *Deleted

## 2015-09-05 ENCOUNTER — Inpatient Hospital Stay (HOSPITAL_COMMUNITY)
Admission: EM | Admit: 2015-09-05 | Discharge: 2015-09-09 | DRG: 918 | Disposition: A | Discharge status: Other Acute Inpatient Hospital | Payer: Medicare Other | Attending: Internal Medicine | Admitting: Internal Medicine

## 2015-09-05 ENCOUNTER — Emergency Department (HOSPITAL_COMMUNITY): Payer: Medicare Other

## 2015-09-05 DIAGNOSIS — I4581 Long QT syndrome: Secondary | ICD-10-CM

## 2015-09-05 DIAGNOSIS — Z881 Allergy status to other antibiotic agents status: Secondary | ICD-10-CM | POA: Diagnosis not present

## 2015-09-05 DIAGNOSIS — T1491XA Suicide attempt, initial encounter: Secondary | ICD-10-CM | POA: Diagnosis present

## 2015-09-05 DIAGNOSIS — Y929 Unspecified place or not applicable: Secondary | ICD-10-CM

## 2015-09-05 DIAGNOSIS — T50901A Poisoning by unspecified drugs, medicaments and biological substances, accidental (unintentional), initial encounter: Secondary | ICD-10-CM | POA: Diagnosis not present

## 2015-09-05 DIAGNOSIS — Z79899 Other long term (current) drug therapy: Secondary | ICD-10-CM

## 2015-09-05 DIAGNOSIS — G4700 Insomnia, unspecified: Secondary | ICD-10-CM | POA: Diagnosis present

## 2015-09-05 DIAGNOSIS — F329 Major depressive disorder, single episode, unspecified: Secondary | ICD-10-CM | POA: Diagnosis present

## 2015-09-05 DIAGNOSIS — F603 Borderline personality disorder: Secondary | ICD-10-CM | POA: Diagnosis not present

## 2015-09-05 DIAGNOSIS — R9431 Abnormal electrocardiogram [ECG] [EKG]: Secondary | ICD-10-CM | POA: Insufficient documentation

## 2015-09-05 DIAGNOSIS — T50902A Poisoning by unspecified drugs, medicaments and biological substances, intentional self-harm, initial encounter: Secondary | ICD-10-CM | POA: Diagnosis not present

## 2015-09-05 DIAGNOSIS — F250 Schizoaffective disorder, bipolar type: Secondary | ICD-10-CM | POA: Diagnosis not present

## 2015-09-05 DIAGNOSIS — Z880 Allergy status to penicillin: Secondary | ICD-10-CM | POA: Diagnosis not present

## 2015-09-05 DIAGNOSIS — T450X2A Poisoning by antiallergic and antiemetic drugs, intentional self-harm, initial encounter: Secondary | ICD-10-CM | POA: Diagnosis not present

## 2015-09-05 DIAGNOSIS — R443 Hallucinations, unspecified: Secondary | ICD-10-CM | POA: Diagnosis not present

## 2015-09-05 DIAGNOSIS — T1491 Suicide attempt: Secondary | ICD-10-CM | POA: Diagnosis not present

## 2015-09-05 DIAGNOSIS — F419 Anxiety disorder, unspecified: Secondary | ICD-10-CM | POA: Diagnosis present

## 2015-09-05 DIAGNOSIS — N189 Chronic kidney disease, unspecified: Secondary | ICD-10-CM | POA: Diagnosis present

## 2015-09-05 DIAGNOSIS — R45851 Suicidal ideations: Secondary | ICD-10-CM | POA: Diagnosis not present

## 2015-09-05 DIAGNOSIS — F4310 Post-traumatic stress disorder, unspecified: Secondary | ICD-10-CM | POA: Diagnosis not present

## 2015-09-05 DIAGNOSIS — Z888 Allergy status to other drugs, medicaments and biological substances status: Secondary | ICD-10-CM

## 2015-09-05 DIAGNOSIS — Z8659 Personal history of other mental and behavioral disorders: Secondary | ICD-10-CM | POA: Diagnosis not present

## 2015-09-05 LAB — CBC
HCT: 38.5 % (ref 36.0–46.0)
Hemoglobin: 12.5 g/dL (ref 12.0–15.0)
MCH: 27.9 pg (ref 26.0–34.0)
MCHC: 32.5 g/dL (ref 30.0–36.0)
MCV: 85.9 fL (ref 78.0–100.0)
PLATELETS: 296 10*3/uL (ref 150–400)
RBC: 4.48 MIL/uL (ref 3.87–5.11)
RDW: 13.2 % (ref 11.5–15.5)
WBC: 7.9 10*3/uL (ref 4.0–10.5)

## 2015-09-05 LAB — ETHANOL

## 2015-09-05 LAB — COMPREHENSIVE METABOLIC PANEL
ALT: 14 U/L (ref 14–54)
ANION GAP: 11 (ref 5–15)
AST: 21 U/L (ref 15–41)
Albumin: 3.9 g/dL (ref 3.5–5.0)
Alkaline Phosphatase: 88 U/L (ref 38–126)
BUN: 11 mg/dL (ref 6–20)
CALCIUM: 9.1 mg/dL (ref 8.9–10.3)
CHLORIDE: 102 mmol/L (ref 101–111)
CO2: 24 mmol/L (ref 22–32)
CREATININE: 1.16 mg/dL — AB (ref 0.44–1.00)
GFR, EST NON AFRICAN AMERICAN: 56 mL/min — AB (ref >60)
Glucose, Bld: 121 mg/dL — ABNORMAL HIGH (ref 65–99)
Potassium: 3.5 mmol/L (ref 3.5–5.1)
SODIUM: 137 mmol/L (ref 135–145)
Total Bilirubin: 0.3 mg/dL (ref 0.3–1.2)
Total Protein: 6.4 g/dL — ABNORMAL LOW (ref 6.5–8.1)

## 2015-09-05 LAB — CBG MONITORING, ED: GLUCOSE-CAPILLARY: 101 mg/dL — AB (ref 65–99)

## 2015-09-05 LAB — I-STAT BETA HCG BLOOD, ED (MC, WL, AP ONLY)

## 2015-09-05 LAB — SALICYLATE LEVEL

## 2015-09-05 LAB — ACETAMINOPHEN LEVEL

## 2015-09-05 MED ORDER — SODIUM CHLORIDE 0.9 % IV BOLUS (SEPSIS)
1000.0000 mL | Freq: Once | INTRAVENOUS | Status: AC
  Administered 2015-09-05: 1000 mL via INTRAVENOUS

## 2015-09-05 NOTE — ED Notes (Signed)
Personal belongings retrieved from patient. 1 Large gold and blue Car[place]elle watch, 1 pair of small diamond stud earrings, I pair of gold hoop earrings , 1 gold wrist bracelet.

## 2015-09-05 NOTE — ED Notes (Signed)
Pt reports auditory hallucinations. Pt states that "[place]" told her to overdose so she drank an entire bottle of childrens benadryl, approx 590mg  at 1530. Pt is very lethargic at triage but is arousable and answering questions.

## 2015-09-05 NOTE — ED Notes (Signed)
Patient asleep, sitter at the bedside. 

## 2015-09-05 NOTE — Progress Notes (Signed)
Attempted to receive report. No answer.   M.Forest Gleason, RN

## 2015-09-05 NOTE — ED Notes (Signed)
Pt sent here from triage not medically clkeared.  She drank a bottle of benadryl   550 mg

## 2015-09-05 NOTE — H&P (Signed)
PCP:  Jonathon Bellows, MD    Referring provider Ayesha Rumpf   Chief Complaint:  Took a bottle of Benadryl  HPI: [name] [name] is a 44 y.o. female   has a past medical history of Heart murmur; Anemia; Depression; Anxiety; Anemia; Chronic kidney disease; Chronic kidney disease; Renal insufficiency; Blood dyscrasia; Iron deficiency anemia, unspecified (11/15/2013); and Iron deficiency anemia, unspecified (11/15/2013).   Presented after taking a bottle of Benadryl she has hx of auditory hallucinations. Patient states that she was told by  "[place]" to take the medication. She drank the entire bottle of children's Benadryl decline to 590 mg. Patient drove herself to ER but was very lethargic in triage. Poison control was called recommending observation and monitoring on telemetry. Patient has some degree of renal insufficiency which expected to slow down clearance.  Patient is unable to provide history at this time the plan is to admit to step down.  Hospitalist was called for admission for Benadryl overdose  Review of Systems:    Pertinent positives include: Auditory hallucinations  Constitutional:  No weight loss, night sweats, Fevers, chills, fatigue, weight loss  HEENT:  No headaches, Difficulty swallowing,Tooth/dental problems,Sore throat,  No sneezing, itching, ear ache, nasal congestion, post nasal drip,  Cardio-vascular:  No chest pain, Orthopnea, PND, anasarca, dizziness, palpitations.no Bilateral lower extremity swelling  GI:  No heartburn, indigestion, abdominal pain, nausea, vomiting, diarrhea, change in bowel habits, loss of appetite, melena, blood in stool, hematemesis Resp:  no shortness of breath at rest. No dyspnea on exertion, No excess mucus, no productive cough, No non-productive cough, No coughing up of blood.No change in color of mucus.No wheezing. Skin:  no rash or lesions. No jaundice GU:  no dysuria, change in color of urine, no urgency or frequency. No  straining to urinate.  No flank pain.  Musculoskeletal:  No joint pain or no joint swelling. No decreased range of motion. No back pain.  Psych:  No change in mood or affect. No depression or anxiety. No memory loss.  Neuro: no localizing neurological complaints, no tingling, no weakness, no double vision, no gait abnormality, no slurred speech, no confusion  Otherwise ROS are negative except for above, 10 systems were reviewed  Past Medical History: Past Medical History  Diagnosis Date  . Heart murmur     asa child   . Anemia     hx of   . Depression   . Anxiety   . Anemia   . Chronic kidney disease     kidney stone   . Chronic kidney disease   . Renal insufficiency   . Blood dyscrasia   . Iron deficiency anemia, unspecified 11/15/2013  . Iron deficiency anemia, unspecified 11/15/2013   Past Surgical History  Procedure Laterality Date  . Other surgical history      cyst removed from ovary ? side   . Nephrolithotomy  03/31/2012    Procedure: NEPHROLITHOTOMY PERCUTANEOUS;  Surgeon: Claybon Jabs, MD;  Location: [facility];  Service: Urology;  Laterality: Left;  . Tubal ligation    . Laparotomy  07/31/2012    Procedure: EXPLORATORY LAPAROTOMY;  Surgeon: Harl Bowie, MD;  Location: [place];  Service: General;  Laterality: N/A;  REPAIR OF PANCREATIC INJURY, EXPLORATION OF RETROPERITONEUM.  [name] Colostomy  07/31/2012    Procedure: COLOSTOMY;  Surgeon: Harl Bowie, MD;  Location: [place];  Service: General;  Laterality: Right;  . Colostomy reversal    . Gws  2013  ABDOMINAL SURGERY  . Colostomy closure N/A 02/15/2013    Procedure: COLOSTOMY CLOSURE;  Surgeon: Gwenyth Ober, MD;  Location: [place];  Service: General;  Laterality: N/A;  Reversal of colostomy  . Dilatation & curettage/hysteroscopy with trueclear N/A 10/24/2013    Procedure: DILATATION & CURETTAGE/HYSTEROSCOPY WITH TRUECLEAR, CERVICAL BLOCK;  Surgeon: Marylynn Pearson, MD;  Location: [place] [facility];  Service: Gynecology;   Laterality: N/A;  . Breast reduction  06/2014     Medications: Prior to Admission medications   Medication Sig Start Date End Date Taking? Authorizing Provider  atropine 1 % ophthalmic solution Place 2 drops under the tongue 2 (two) times daily.    Historical Provider, MD  cloZAPine (CLOZARIL) 25 MG tablet Take 100 mg by mouth 2 (two) times daily.     Historical Provider, MD  dexamethasone (DECADRON) 1 MG tablet Take at 9-10 pm, the night before blood test 03/06/15   Renato Shin, MD  FLUoxetine (PROZAC) 20 MG capsule Take 20 mg by mouth daily.    Historical Provider, MD  hydrOXYzine (ATARAX/VISTARIL) 50 MG tablet Take 50 mg by mouth 2 (two) times daily.    Historical Provider, MD  hydrOXYzine (VISTARIL) 50 MG capsule Take 50 mg by mouth 2 (two) times daily.    Historical Provider, MD  Melatonin 3 MG TABS Take 6 mg by mouth. 09/18/14   Historical Provider, MD  traZODone (DESYREL) 100 MG tablet Take 100 mg by mouth. 09/18/14   Historical Provider, MD    Allergies:   Allergies  Allergen Reactions  . Enoxaparin Hives  . Augmentin [Amoxicillin-Pot Clavulanate] Itching, Swelling and Rash  . Avelox [Moxifloxacin Hcl In Nacl] Itching, Swelling and Rash  . Sodium Hydroxide Rash    Social History:  Ambulatory   Independently  Lives at home alone,        reports that she has never smoked. She has never used smokeless tobacco. She reports that she does not drink alcohol or use illicit drugs.    Family History: family history includes Cancer in her mother. There is no history of Other.    Physical Exam: Patient Vitals for the past 24 hrs:  BP Temp Temp src Pulse Resp SpO2 Height Weight  09/05/15 2045 140/73 mmHg - - 78 16 98 % - -  09/05/15 2030 143/69 mmHg - - 78 17 98 % - -  09/05/15 2015 136/70 mmHg - - 83 18 98 % - -  09/05/15 1858 161/84 mmHg 99.3 F (37.4 C) Oral 85 20 93 % - -  09/05/15 1750 168/99 mmHg 98.5 F (36.9 C) Oral 93 22 94 % 5\' 4"  (1.626 m) 98.431 kg (217 lb)     1. General:  in No Acute distress 2. Psychological lethargic and not Oriented 3. Head/ENT:     Dry Mucous Membranes                          Head Non traumatic, neck supple                          Normal   Dentition 4. SKIN: decreased Skin turgor,  Skin clean Dry and intact no rash 5. Heart: Regular rate and rhythm no Murmur, Rub or gallop 6. Lungs: Clear to auscultation bilaterally, no wheezes or crackles   7. Abdomen: Soft, non-tender, Non distended 8. Lower extremities: no clubbing, cyanosis, or edema 9. Neurologically Grossly intact, moving all 4 extremities equally  10. MSK: Normal range of motion  body mass index is 37.23 kg/(m^2).   Labs on Admission:   Results for orders placed or performed during the hospital encounter of 09/05/15 (from the past 24 hour(s))  Comprehensive metabolic panel     Status: Abnormal   Collection Time: 09/05/15  6:42 PM  Result Value Ref Range   Sodium 137 135 - 145 mmol/L   Potassium 3.5 3.5 - 5.1 mmol/L   Chloride 102 101 - 111 mmol/L   CO2 24 22 - 32 mmol/L   Glucose, Bld 121 (H) 65 - 99 mg/dL   BUN 11 6 - 20 mg/dL   Creatinine, Ser 1.16 (H) 0.44 - 1.00 mg/dL   Calcium 9.1 8.9 - 10.3 mg/dL   Total Protein 6.4 (L) 6.5 - 8.1 g/dL   Albumin 3.9 3.5 - 5.0 g/dL   AST 21 15 - 41 U/L   ALT 14 14 - 54 U/L   Alkaline Phosphatase 88 38 - 126 U/L   Total Bilirubin 0.3 0.3 - 1.2 mg/dL   GFR calc non Af Amer 56 (L) >60 mL/min   GFR calc Af Amer >60 >60 mL/min   Anion gap 11 5 - 15  CBC     Status: None   Collection Time: 09/05/15  6:42 PM  Result Value Ref Range   WBC 7.9 4.0 - 10.5 K/uL   RBC 4.48 3.87 - 5.11 MIL/uL   Hemoglobin 12.5 12.0 - 15.0 g/dL   HCT 38.5 36.0 - 46.0 %   MCV 85.9 78.0 - 100.0 fL   MCH 27.9 26.0 - 34.0 pg   MCHC 32.5 30.0 - 36.0 g/dL   RDW 13.2 11.5 - 15.5 %   Platelets 296 150 - 400 K/uL  Ethanol (ETOH)     Status: None   Collection Time: 09/05/15  6:43 PM  Result Value Ref Range   Alcohol, Ethyl (B) <5 <5  mg/dL  Salicylate level     Status: None   Collection Time: 09/05/15  6:43 PM  Result Value Ref Range   Salicylate Lvl <4.4 2.8 - 30.0 mg/dL  Acetaminophen level     Status: Abnormal   Collection Time: 09/05/15  6:43 PM  Result Value Ref Range   Acetaminophen (Tylenol), Serum <10 (L) 10 - 30 ug/mL  I-Stat beta hCG blood, ED ([facility], [facility], [facility] only)     Status: None   Collection Time: 09/05/15  6:43 PM  Result Value Ref Range   I-stat hCG, quantitative <5.0 <5 mIU/mL   Comment 3          CBG monitoring, ED     Status: Abnormal   Collection Time: 09/05/15  6:54 PM  Result Value Ref Range   Glucose-Capillary 101 (H) 65 - 99 mg/dL    UA odered  No results found for: HGBA1C  Estimated Creatinine Clearance: 70.5 mL/min (by C-G formula based on Cr of 1.16).  BNP (last 3 results)  Recent Labs  09/30/14 1806  PROBNP 12.8    Other results:  I have pearsonaly reviewed this: ECG REPORT  Rate: 81  Rhythm: Sinus rhythm ST&T Change: no ischemic changes QTC 508   Filed Weights   09/05/15 1750  Weight: 98.431 kg (217 lb)     Cultures:    Component Value Date/Time   SDES WOUND ABDOMEN 02/27/2013 2318   [place] NONE 02/27/2013 2318   CULT FEW ENTEROCOCCUS SPECIES 02/27/2013 2318   REPTSTATUS 03/03/2013 FINAL 02/27/2013 2318  Radiological Exams on Admission: Dg Chest Port 1 View  09/05/2015   CLINICAL DATA:  Auditory hallucinations, overdose on Benadryl  EXAM: PORTABLE CHEST - 1 VIEW  COMPARISON:  05/29/2015  FINDINGS: Very limited inspiratory effect. This exaggerates the size of the cardiac silhouette. This also exaggerates pulmonary parenchymal markings. The right diaphragm is elevated similar to prior study allowing for differences in inspiration. No consolidation.  IMPRESSION: No acute findings   Electronically Signed   By: Skipper Cliche M.D.   On: 09/05/2015 20:00    Chart has been reviewed  Family not at  Bedside    Assessment/Plan  44 yo F with hx of  borderline personality disorder here with suicide attempt with Benadryl   Present on Admission:  . Borderline personality disorder consult psychiatry. Suicide precautions hold her home medications for now  . Suicide attempt - consult to psychiatry and placed suicide precautions . Overdose, drug - overdose with benadryl, will monitor in telemetry due to severe sedation, QTC prolonged,  monitor on tele, place foley if evidence of urinary retantion   Prophylaxis:  Lovenox   CODE STATUS:  FULL CODE  Disposition: To home once workup is complete and patient is stable, may need psychiatric admission after medically cleared   Other plan as per orders.  I have spent a total of 65 min on this admission  Extra time was taken to discuss case with Behavioral health and request consult.   [place] 09/05/2015, 9:47 PM  [facility]  Pager [phone]   after 2 AM please page floor coverage PA If 7AM-7PM, please contact the day team taking care of the patient  [email or web address]  Password [email or web address]
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
## 2015-09-06 DIAGNOSIS — R45851 Suicidal ideations: Secondary | ICD-10-CM

## 2015-09-06 DIAGNOSIS — T1491 Suicide attempt: Secondary | ICD-10-CM

## 2015-09-06 DIAGNOSIS — T450X2A Poisoning by antiallergic and antiemetic drugs, intentional self-harm, initial encounter: Principal | ICD-10-CM

## 2015-09-06 DIAGNOSIS — T50901A Poisoning by unspecified drugs, medicaments and biological substances, accidental (unintentional), initial encounter: Secondary | ICD-10-CM

## 2015-09-06 LAB — CBC
HEMATOCRIT: 38.5 % (ref 36.0–46.0)
HEMOGLOBIN: 12.6 g/dL (ref 12.0–15.0)
MCH: 28.3 pg (ref 26.0–34.0)
MCHC: 32.7 g/dL (ref 30.0–36.0)
MCV: 86.3 fL (ref 78.0–100.0)
Platelets: 288 10*3/uL (ref 150–400)
RBC: 4.46 MIL/uL (ref 3.87–5.11)
RDW: 13.3 % (ref 11.5–15.5)
WBC: 7.2 10*3/uL (ref 4.0–10.5)

## 2015-09-06 LAB — URINALYSIS, ROUTINE W REFLEX MICROSCOPIC
BILIRUBIN URINE: NEGATIVE
Glucose, UA: NEGATIVE mg/dL
Hgb urine dipstick: NEGATIVE
KETONES UR: NEGATIVE mg/dL
LEUKOCYTES UA: NEGATIVE
NITRITE: NEGATIVE
PROTEIN: NEGATIVE mg/dL
Specific Gravity, Urine: 1.007 (ref 1.005–1.030)
UROBILINOGEN UA: 0.2 mg/dL (ref 0.0–1.0)
pH: 7 (ref 5.0–8.0)

## 2015-09-06 LAB — TSH: TSH: 2.219 u[IU]/mL (ref 0.350–4.500)

## 2015-09-06 LAB — RAPID URINE DRUG SCREEN, HOSP PERFORMED
AMPHETAMINES: NOT DETECTED
BENZODIAZEPINES: NOT DETECTED
Barbiturates: NOT DETECTED
COCAINE: NOT DETECTED
OPIATES: NOT DETECTED
TETRAHYDROCANNABINOL: NOT DETECTED

## 2015-09-06 LAB — COMPREHENSIVE METABOLIC PANEL
ALT: 13 U/L — AB (ref 14–54)
ANION GAP: 7 (ref 5–15)
AST: 21 U/L (ref 15–41)
Albumin: 3.4 g/dL — ABNORMAL LOW (ref 3.5–5.0)
Alkaline Phosphatase: 81 U/L (ref 38–126)
BUN: 8 mg/dL (ref 6–20)
CHLORIDE: 105 mmol/L (ref 101–111)
CO2: 25 mmol/L (ref 22–32)
Calcium: 8.3 mg/dL — ABNORMAL LOW (ref 8.9–10.3)
Creatinine, Ser: 1.02 mg/dL — ABNORMAL HIGH (ref 0.44–1.00)
GFR calc non Af Amer: >60 mL/min (ref >60)
Glucose, Bld: 107 mg/dL — ABNORMAL HIGH (ref 65–99)
POTASSIUM: 3.8 mmol/L (ref 3.5–5.1)
SODIUM: 137 mmol/L (ref 135–145)
Total Bilirubin: 0.5 mg/dL (ref 0.3–1.2)
Total Protein: 5.9 g/dL — ABNORMAL LOW (ref 6.5–8.1)

## 2015-09-06 LAB — PHOSPHORUS: Phosphorus: 3.5 mg/dL (ref 2.5–4.6)

## 2015-09-06 LAB — MAGNESIUM: MAGNESIUM: 1.7 mg/dL (ref 1.7–2.4)

## 2015-09-06 LAB — MRSA PCR SCREENING: MRSA BY PCR: NEGATIVE

## 2015-09-06 MED ORDER — SODIUM CHLORIDE 0.9 % IJ SOLN
3.0000 mL | Freq: Two times a day (BID) | INTRAMUSCULAR | Status: DC
  Administered 2015-09-06 – 2015-09-08 (×3): 3 mL via INTRAVENOUS

## 2015-09-06 MED ORDER — SODIUM CHLORIDE 0.9 % IV SOLN
INTRAVENOUS | Status: DC
  Administered 2015-09-06: 75 mL/h via INTRAVENOUS
  Administered 2015-09-07: 05:00:00 via INTRAVENOUS

## 2015-09-06 MED ORDER — MAGNESIUM SULFATE 2 GM/50ML IV SOLN
2.0000 g | Freq: Once | INTRAVENOUS | Status: AC
  Administered 2015-09-06: 2 g via INTRAVENOUS
  Filled 2015-09-06: qty 50

## 2015-09-06 MED ORDER — POTASSIUM CHLORIDE CRYS ER 20 MEQ PO TBCR
40.0000 meq | EXTENDED_RELEASE_TABLET | Freq: Once | ORAL | Status: AC
  Administered 2015-09-06: 40 meq via ORAL
  Filled 2015-09-06: qty 2

## 2015-09-06 MED ORDER — INFLUENZA VAC SPLIT QUAD 0.5 ML IM SUSY
0.5000 mL | PREFILLED_SYRINGE | INTRAMUSCULAR | Status: DC
  Filled 2015-09-06: qty 0.5

## 2015-09-06 MED ORDER — ENOXAPARIN SODIUM 40 MG/0.4ML ~~LOC~~ SOLN: 40.0000 mg | SUBCUTANEOUS | Status: DC

## 2015-09-06 NOTE — Progress Notes (Signed)
Spoke with Roshonna at poison control. She states that based on patient's EKG results and K and Mag levels that the patient should have her Potassium and Magnesium levels supplemented to the high end of normal and then have the EKG rechecked. Sent message to notify MD. Milford Cage, RN

## 2015-09-06 NOTE — Progress Notes (Signed)
Utilization Review Completed.[name] [name] T9/09/2015  
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
## 2015-09-06 NOTE — Progress Notes (Signed)
PATIENT DETAILS Name: [name] [name] Age: 44 y.o. Sex: female Date of Birth: July 12, 1971 Admit Date: 09/05/2015 Admitting Physician Toy Baker, MD NIO:EVOJ, Valla Leaver, MD  Subjective: Actively hallucinating-auditory hallucination. "[place]" still asking her to do things. Per RN, she was seen with the telemetry wires around the neck.  Assessment/Plan: Active Problems: Suicidal attempt with Benadryl /ongoing auditory hallucination: Improving, start diet. QTC decreasing. Continue supportive care. Psychiatry consulted, continue with sitter.  History of depression: All anti-psychotics/anti-depressions on hold. Await further recommendations from psychiatry.  Disposition: Remain inpatient-transfer to telemetry  Antimicrobial agents  See below  Anti-infectives    None      DVT Prophylaxis:  SCD's  Code Status: Full code   Family Communication None at bedside  Procedures: None  CONSULTS:  psychiatry  Time spent 25 minutes-Greater than 50% of this time was spent in counseling, explanation of diagnosis, planning of further management, and coordination of care.  MEDICATIONS: Scheduled Meds: . [START ON 09/07/2015] Influenza vac split quadrivalent PF  0.5 mL Intramuscular Tomorrow-1000  . sodium chloride  3 mL Intravenous Q12H   Continuous Infusions:  PRN Meds:.    PHYSICAL EXAM: Vital signs in last 24 hours: Filed Vitals:   09/06/15 0200 09/06/15 0400 09/06/15 0610 09/06/15 0825  BP: 145/75 145/77 126/91 123/71  Pulse: 56 59 96 63  Temp:  98.5 F (36.9 C)  98.4 F (36.9 C)  TempSrc:  Oral  Oral  Resp: 17 13 23 16   Height:      Weight:      SpO2:  95% 93% 93%    Weight change:  Filed Weights   09/05/15 1750  Weight: 98.431 kg (217 lb)   Body mass index is 37.23 kg/(m^2).   Gen Exam: Awake and alert with clear speech. Neck: Supple, No JVD.   Chest: B/L Clear.   CVS: S1 S2 Regular, no murmurs.  Abdomen: soft, BS +, non tender,  non distended.  Extremities: no edema, lower extremities warm to touch. Neurologic: Non Focal.   Skin: No Rash.   Wounds: N/A.   Intake/Output from previous day:  Intake/Output Summary (Last 24 hours) at 09/06/15 1048 Last data filed at 09/06/15 0927  Gross per 24 hour  Intake    240 ml  Output    750 ml  Net   -510 ml     LAB RESULTS: CBC  Recent Labs Lab 09/05/15 1842 09/06/15 0325  WBC 7.9 7.2  HGB 12.5 12.6  HCT 38.5 38.5  PLT 296 288  MCV 85.9 86.3  MCH 27.9 28.3  MCHC 32.5 32.7  RDW 13.2 13.3    Chemistries   Recent Labs Lab 09/05/15 1842 09/06/15 0325  NA 137 137  K 3.5 3.8  CL 102 105  CO2 24 25  GLUCOSE 121* 107*  BUN 11 8  CREATININE 1.16* 1.02*  CALCIUM 9.1 8.3*  MG  --  1.7    CBG:  Recent Labs Lab 09/05/15 1854  GLUCAP 101*    GFR Estimated Creatinine Clearance: 80.2 mL/min (by C-G formula based on Cr of 1.02).  Coagulation profile No results for input(s): INR, PROTIME in the last 168 hours.  Cardiac Enzymes No results for input(s): CKMB, TROPONINI, MYOGLOBIN in the last 168 hours.  Invalid input(s): CK  Invalid input(s): POCBNP No results for input(s): DDIMER in the last 72 hours. No results for input(s): HGBA1C in the last 72 hours. No  results for input(s): CHOL, HDL, LDLCALC, TRIG, CHOLHDL, LDLDIRECT in the last 72 hours.  Recent Labs  09/06/15 0325  TSH 2.219   No results for input(s): VITAMINB12, FOLATE, FERRITIN, TIBC, IRON, RETICCTPCT in the last 72 hours. No results for input(s): LIPASE, AMYLASE in the last 72 hours.  Urine Studies No results for input(s): UHGB, CRYS in the last 72 hours.  Invalid input(s): UACOL, UAPR, USPG, UPH, UTP, UGL, UKET, UBIL, UNIT, UROB, ULEU, UEPI, UWBC, URBC, UBAC, CAST, UCOM, BILUA  MICROBIOLOGY: Recent Results (from the past 240 hour(s))  MRSA PCR Screening     Status: None   Collection Time: 09/06/15 12:20 AM  Result Value Ref Range Status   MRSA by PCR NEGATIVE NEGATIVE  Final    Comment:        The GeneXpert MRSA Assay (FDA approved for NASAL specimens only), is one component of a comprehensive MRSA colonization surveillance program. It is not intended to diagnose MRSA infection nor to guide or monitor treatment for MRSA infections.     RADIOLOGY STUDIES/RESULTS: Dg Chest Port 1 View  09/05/2015   CLINICAL DATA:  Auditory hallucinations, overdose on Benadryl  EXAM: PORTABLE CHEST - 1 VIEW  COMPARISON:  05/29/2015  FINDINGS: Very limited inspiratory effect. This exaggerates the size of the cardiac silhouette. This also exaggerates pulmonary parenchymal markings. The right diaphragm is elevated similar to prior study allowing for differences in inspiration. No consolidation.  IMPRESSION: No acute findings   Electronically Signed   By: Skipper Cliche M.D.   On: 09/05/2015 20:00    Oren Binet, MD  [facility] Pager:[phone]  If 7PM-7AM, please contact night-coverage [email or web address] Password [email or web address] 09/06/2015, 10:48 AM   LOS: 1 day

## 2015-09-06 NOTE — Consult Note (Signed)
[facility] Consult   Reason for Consult:  Depression and intentional overdose Referring Physician:  Dr. Sloan Leiter Patient Identification: [name] [name] MRN:  [id number] Principal Diagnosis: Suicide attempt Diagnosis:   Patient Active Problem List   Diagnosis Date Noted  . Suicide attempt [T14.91] 09/05/2015  . Overdose, drug [T50.901A] 09/05/2015  . Overdose [T50.901A] 09/05/2015  . Drug overdose [T50.901A]   . QT prolongation [I45.81]   . Weight gain [R63.5] 03/06/2015  . [place] [R23.2] 03/06/2015  . Iron deficiency anemia, unspecified [D50.9] 11/15/2013  . Liver mass, left lobe [R16.0] 05/28/2013  . Postop check [Z09] 03/13/2013  . Postoperative wound infection [T81.4XXA] 02/23/2013  . History of colostomy [Z87.19] 02/16/2013  . Borderline personality disorder [F60.3] 08/16/2012    Class: Chronic  . Acute blood loss anemia [D62] 08/01/2012  . Depression, major [F32.2] 08/01/2012  . Renal calculus, left [N20.0] 03/31/2012  . Hydronephrosis, left [N13.30] 03/31/2012    Total Time spent with patient: 1 hour  Subjective:   [name] [name] is a 44 y.o. female patient admitted with intentional overdose of benadryl.  HPI:  [name] [name] is a 44 y.o. female seen face-to-face for psychiatric consultation and evaluation of depression, intentional suicide attempt by overdosing 590 mg of children's Benadryl. Patient reported she has been stressed about her daughter becoming 70 years old and not able to come back to her and her husband has been in the process of divorce. Patient reportedly having auditory hallucinations telling her to drink a bottle of Benadryl to end her life. Patient reported her friend [place] is telling her to kill herself by drinking Benadryl or by putting a rope around her neck. Patient stated sometimes her friend being friendly and also good stuff to her. Patient stated that she does not follow her friends advice she will get loud and increased  in volume of the radio which irritates her more. Patient reportedly drove herself to the emergency department in a very lethargic state after overdose on Benadryl. Patient has a history of bipolar disorder, Maj. depressive disorder, anxiety disorder and received both outpatient and inpatient treatment from behavioral [facility] before 2013. Currently she has been receiving mental health treatment from ACT team from [facility]. Patient could not provide contact members at this time. Patient appeared sitting in a chair next to her bed during this evaluation with the increased psychomotor retardation, decreased volume of speech and still responding to the internal stimuli and also stated she can see her friend right in front of her which nobody can see. Patient has a Air cabin crew at bedside.   HPI Elements:   Location:  Depression anxiety and hallucinations. Quality:  Poor. Severity:  Intentional drug overdose. Timing:  Significant stress from process of divorce and missing her daughter. Duration:  Few days. Context:  Psychosocial stresses.  Past Medical History:  Past Medical History  Diagnosis Date  . Heart murmur     asa child   . Anemia     hx of   . Depression   . Anxiety   . Anemia   . Chronic kidney disease     kidney stone   . Chronic kidney disease   . Renal insufficiency   . Blood dyscrasia   . Iron deficiency anemia, unspecified 11/15/2013  . Iron deficiency anemia, unspecified 11/15/2013    Past Surgical History  Procedure Laterality Date  . Other surgical history      cyst removed from ovary ? side   . Nephrolithotomy  03/31/2012    Procedure: NEPHROLITHOTOMY PERCUTANEOUS;  Surgeon: Claybon Jabs, MD;  Location: [facility];  Service: Urology;  Laterality: Left;  . Tubal ligation    . Laparotomy  07/31/2012    Procedure: EXPLORATORY LAPAROTOMY;  Surgeon: Harl Bowie, MD;  Location: [place];  Service: General;  Laterality: N/A;  REPAIR OF PANCREATIC INJURY, EXPLORATION  OF RETROPERITONEUM.  [name] Colostomy  07/31/2012    Procedure: COLOSTOMY;  Surgeon: Harl Bowie, MD;  Location: [place];  Service: General;  Laterality: Right;  . Colostomy reversal    . Gws  2013    ABDOMINAL SURGERY  . Colostomy closure N/A 02/15/2013    Procedure: COLOSTOMY CLOSURE;  Surgeon: Gwenyth Ober, MD;  Location: [place];  Service: General;  Laterality: N/A;  Reversal of colostomy  . Dilatation & curettage/hysteroscopy with trueclear N/A 10/24/2013    Procedure: DILATATION & CURETTAGE/HYSTEROSCOPY WITH TRUECLEAR, CERVICAL BLOCK;  Surgeon: Marylynn Pearson, MD;  Location: [facility];  Service: Gynecology;  Laterality: N/A;  . Breast reduction  06/2014   Family History:  Family History  Problem Relation Age of Onset  . Cancer Mother     kidney  . Other Neg Hx     adrenal problem   Social History:  History  Alcohol Use No     History  Drug Use No    Social History   Social History  . Marital Status: Legally Separated    Spouse Name: N/A  . Number of Children: 2  . Years of Education: N/A   Occupational History  . Disabled    Social History Main Topics  . Smoking status: Never Smoker   . Smokeless tobacco: Never Used  . Alcohol Use: No  . Drug Use: No  . Sexual Activity: Yes    Birth Control/ Protection: Surgical   Other Topics Concern  . None   Social History Narrative   ** Merged History Encounter **       Additional Social History:                          Allergies:   Allergies  Allergen Reactions  . Enoxaparin Hives  . Augmentin [Amoxicillin-Pot Clavulanate] Itching, Swelling and Rash  . Avelox [Moxifloxacin Hcl In Nacl] Itching, Swelling and Rash  . Sodium Hydroxide Rash    Labs:  Results for orders placed or performed during the hospital encounter of 09/05/15 (from the past 48 hour(s))  Comprehensive metabolic panel     Status: Abnormal   Collection Time: 09/05/15  6:42 PM  Result Value Ref Range   Sodium 137 135 - 145 mmol/L    Potassium 3.5 3.5 - 5.1 mmol/L   Chloride 102 101 - 111 mmol/L   CO2 24 22 - 32 mmol/L   Glucose, Bld 121 (H) 65 - 99 mg/dL   BUN 11 6 - 20 mg/dL   Creatinine, Ser 1.16 (H) 0.44 - 1.00 mg/dL   Calcium 9.1 8.9 - 10.3 mg/dL   Total Protein 6.4 (L) 6.5 - 8.1 g/dL   Albumin 3.9 3.5 - 5.0 g/dL   AST 21 15 - 41 U/L   ALT 14 14 - 54 U/L   Alkaline Phosphatase 88 38 - 126 U/L   Total Bilirubin 0.3 0.3 - 1.2 mg/dL   GFR calc non Af Amer 56 (L) >60 mL/min   GFR calc Af Amer >60 >60 mL/min    Comment: (NOTE) The eGFR has been calculated  using the CKD EPI equation. This calculation has not been validated in all clinical situations. eGFR's persistently <60 mL/min signify possible Chronic Kidney Disease.    Anion gap 11 5 - 15  CBC     Status: None   Collection Time: 09/05/15  6:42 PM  Result Value Ref Range   WBC 7.9 4.0 - 10.5 K/uL   RBC 4.48 3.87 - 5.11 MIL/uL   Hemoglobin 12.5 12.0 - 15.0 g/dL   HCT 38.5 36.0 - 46.0 %   MCV 85.9 78.0 - 100.0 fL   MCH 27.9 26.0 - 34.0 pg   MCHC 32.5 30.0 - 36.0 g/dL   RDW 13.2 11.5 - 15.5 %   Platelets 296 150 - 400 K/uL  Ethanol (ETOH)     Status: None   Collection Time: 09/05/15  6:43 PM  Result Value Ref Range   Alcohol, Ethyl (B) <5 <5 mg/dL    Comment:        LOWEST DETECTABLE LIMIT FOR SERUM ALCOHOL IS 5 mg/dL FOR MEDICAL PURPOSES ONLY   Salicylate level     Status: None   Collection Time: 09/05/15  6:43 PM  Result Value Ref Range   Salicylate Lvl <4.4 2.8 - 30.0 mg/dL  Acetaminophen level     Status: Abnormal   Collection Time: 09/05/15  6:43 PM  Result Value Ref Range   Acetaminophen (Tylenol), Serum <10 (L) 10 - 30 ug/mL    Comment:        THERAPEUTIC CONCENTRATIONS VARY SIGNIFICANTLY. A RANGE OF 10-30 ug/mL MAY BE AN EFFECTIVE CONCENTRATION FOR MANY PATIENTS. HOWEVER, SOME ARE BEST TREATED AT CONCENTRATIONS OUTSIDE THIS RANGE. ACETAMINOPHEN CONCENTRATIONS >150 ug/mL AT 4 HOURS AFTER INGESTION AND >50 ug/mL AT 12 HOURS  AFTER INGESTION ARE OFTEN ASSOCIATED WITH TOXIC REACTIONS.   I-Stat beta hCG blood, ED ([facility], [facility], [facility] only)     Status: None   Collection Time: 09/05/15  6:43 PM  Result Value Ref Range   I-stat hCG, quantitative <5.0 <5 mIU/mL   Comment 3            Comment:   GEST. AGE      CONC.  (mIU/mL)   <=1 WEEK        5 - 50     2 WEEKS       50 - 500     3 WEEKS       100 - 10,000     4 WEEKS     1,000 - 30,000        FEMALE AND NON-PREGNANT FEMALE:     LESS THAN 5 mIU/mL   CBG monitoring, ED     Status: Abnormal   Collection Time: 09/05/15  6:54 PM  Result Value Ref Range   Glucose-Capillary 101 (H) 65 - 99 mg/dL  MRSA PCR Screening     Status: None   Collection Time: 09/06/15 12:20 AM  Result Value Ref Range   MRSA by PCR NEGATIVE NEGATIVE    Comment:        The GeneXpert MRSA Assay (FDA approved for NASAL specimens only), is one component of a comprehensive MRSA colonization surveillance program. It is not intended to diagnose MRSA infection nor to guide or monitor treatment for MRSA infections.   Urine rapid drug screen (hosp performed) (Not at [facility])     Status: None   Collection Time: 09/06/15  3:12 AM  Result Value Ref Range   Opiates NONE DETECTED NONE DETECTED   Cocaine NONE  DETECTED NONE DETECTED   Benzodiazepines NONE DETECTED NONE DETECTED   Amphetamines NONE DETECTED NONE DETECTED   Tetrahydrocannabinol NONE DETECTED NONE DETECTED   Barbiturates NONE DETECTED NONE DETECTED    Comment:        DRUG SCREEN FOR MEDICAL PURPOSES ONLY.  IF CONFIRMATION IS NEEDED FOR ANY PURPOSE, NOTIFY LAB WITHIN 5 DAYS.        LOWEST DETECTABLE LIMITS FOR URINE DRUG SCREEN Drug Class       Cutoff (ng/mL) Amphetamine      1000 Barbiturate      200 Benzodiazepine   545 Tricyclics       625 Opiates          300 Cocaine          300 THC              50   Urinalysis, Routine w reflex microscopic (not at [facility])     Status: None   Collection Time: 09/06/15  3:13 AM  Result Value Ref  Range   Color, Urine YELLOW YELLOW   APPearance CLEAR CLEAR   Specific Gravity, Urine 1.007 1.005 - 1.030   pH 7.0 5.0 - 8.0   Glucose, UA NEGATIVE NEGATIVE mg/dL   Hgb urine dipstick NEGATIVE NEGATIVE   Bilirubin Urine NEGATIVE NEGATIVE   Ketones, ur NEGATIVE NEGATIVE mg/dL   Protein, ur NEGATIVE NEGATIVE mg/dL   Urobilinogen, UA 0.2 0.0 - 1.0 mg/dL   Nitrite NEGATIVE NEGATIVE   Leukocytes, UA NEGATIVE NEGATIVE    Comment: MICROSCOPIC NOT DONE ON URINES WITH NEGATIVE PROTEIN, BLOOD, LEUKOCYTES, NITRITE, OR GLUCOSE <1000 mg/dL.  Magnesium     Status: None   Collection Time: 09/06/15  3:25 AM  Result Value Ref Range   Magnesium 1.7 1.7 - 2.4 mg/dL  Phosphorus     Status: None   Collection Time: 09/06/15  3:25 AM  Result Value Ref Range   Phosphorus 3.5 2.5 - 4.6 mg/dL  TSH     Status: None   Collection Time: 09/06/15  3:25 AM  Result Value Ref Range   TSH 2.219 0.350 - 4.500 uIU/mL  Comprehensive metabolic panel     Status: Abnormal   Collection Time: 09/06/15  3:25 AM  Result Value Ref Range   Sodium 137 135 - 145 mmol/L   Potassium 3.8 3.5 - 5.1 mmol/L   Chloride 105 101 - 111 mmol/L   CO2 25 22 - 32 mmol/L   Glucose, Bld 107 (H) 65 - 99 mg/dL   BUN 8 6 - 20 mg/dL   Creatinine, Ser 1.02 (H) 0.44 - 1.00 mg/dL   Calcium 8.3 (L) 8.9 - 10.3 mg/dL   Total Protein 5.9 (L) 6.5 - 8.1 g/dL   Albumin 3.4 (L) 3.5 - 5.0 g/dL   AST 21 15 - 41 U/L   ALT 13 (L) 14 - 54 U/L   Alkaline Phosphatase 81 38 - 126 U/L   Total Bilirubin 0.5 0.3 - 1.2 mg/dL   GFR calc non Af Amer >60 >60 mL/min   GFR calc Af Amer >60 >60 mL/min    Comment: (NOTE) The eGFR has been calculated using the CKD EPI equation. This calculation has not been validated in all clinical situations. eGFR's persistently <60 mL/min signify possible Chronic Kidney Disease.    Anion gap 7 5 - 15  CBC     Status: None   Collection Time: 09/06/15  3:25 AM  Result Value Ref Range   WBC 7.2 4.0 -  10.5 K/uL   RBC 4.46 3.87  - 5.11 MIL/uL   Hemoglobin 12.6 12.0 - 15.0 g/dL   HCT 38.5 36.0 - 46.0 %   MCV 86.3 78.0 - 100.0 fL   MCH 28.3 26.0 - 34.0 pg   MCHC 32.7 30.0 - 36.0 g/dL   RDW 13.3 11.5 - 15.5 %   Platelets 288 150 - 400 K/uL    Vitals: Blood pressure 111/67, pulse 89, temperature 98.2 F (36.8 C), temperature source Oral, resp. rate 16, height '5\' 4"'  (1.626 m), weight 98.431 kg (217 lb), SpO2 99 %.  Risk to Self: Is patient at risk for suicide?: Yes Risk to Others:   Prior Inpatient Therapy:   Prior Outpatient Therapy:    Current Facility-Administered Medications  Medication Dose Route Frequency Provider Last Rate Last Dose  . [START ON 09/07/2015] Influenza vac split quadrivalent PF (FLUARIX) injection 0.5 mL  0.5 mL Intramuscular Tomorrow-1000 Anastassia Doutova, MD      . magnesium sulfate IVPB 2 g 50 mL  2 g Intravenous Once Shanker Kristeen Mans, MD      . potassium chloride SA (K-DUR,KLOR-CON) CR tablet 40 mEq  40 mEq Oral Once Jonetta Osgood, MD      . sodium chloride 0.9 % injection 3 mL  3 mL Intravenous Q12H Toy Baker, MD   3 mL at 09/06/15 0109    Musculoskeletal: Strength & Muscle Tone: decreased Gait & Station: unsteady Patient leans: N/A  Psychiatric Specialty Exam: Physical Exam as per history and physical   ROS drowsiness, generalized weakness, fatigue, depression, anxiety and hallucinations. Patient denied nausea vomiting chest pain and shortness of breath No Fever-chills, No Headache, No changes with Vision or hearing, reports vertigo No problems swallowing food or Liquids, No Chest pain, Cough or Shortness of Breath, No Abdominal pain, No Nausea or Vommitting, Bowel movements are regular, No Blood in stool or Urine, No dysuria, No new skin rashes or bruises, No new joints pains-aches,  No new weakness, tingling, numbness in any extremity, No recent weight gain or loss, No polyuria, polydypsia or polyphagia,   A full 10 point Review of Systems was done,  except as stated above, all other Review of Systems were negative.  Blood pressure 111/67, pulse 89, temperature 98.2 F (36.8 C), temperature source Oral, resp. rate 16, height '5\' 4"'  (1.626 m), weight 98.431 kg (217 lb), SpO2 99 %.Body mass index is 37.23 kg/(m^2).  General Appearance: Guarded  Eye Contact::  Fair  Speech:  Blocked and Slow  Volume:  Decreased  Mood:  Anxious and Depressed  Affect:  Constricted and Depressed  Thought Process:  Coherent and Goal Directed  Orientation:  Full (Time, Place, and Person)  Thought Content:  Hallucinations: Command:  Telling her to drink Benadryl and asking for a rope to tie around her neck  Visual and Rumination  Suicidal Thoughts:  Yes.  with intent/plan  Homicidal Thoughts:  No  Memory:  Immediate;   Fair Recent;   Fair  Judgement:  Impaired  Insight:  Fair  Psychomotor Activity:  Decreased  Concentration:  Fair  Recall:  [employer] of Knowledge:Good  Language: Good  Akathisia:  Negative  Handed:  Right  AIMS (if indicated):     Assets:  Communication Skills Desire for Improvement Financial Resources/Insurance Housing Leisure Time Resilience Social Support  ADL's:  Impaired  Cognition: Impaired,  Mild  Sleep:      Medical Decision Making: Established Problem, Worsening (2), Review of Last Therapy Session (  1), Review or order medicine tests (1), Review of Medication Regimen & Side Effects (2) and Review of New Medication or Change in Dosage (2)  Treatment Plan Summary: Daily contact with patient to assess and evaluate symptoms and progress in treatment and Medication management  Plan:  Continue safety sitter as patient has intentional overdose Patient continued to be psychotic with the hallucinations Patient may start home medication when medically stable, Prozac 20 mg daily for depression and trazodone 100 mg at bedtime insomnia and also risperidone 0. 25 mg twice daily for psychosis Recommend psychiatric Inpatient  admission when medically cleared. Supportive therapy provided about ongoing stressors. Appreciate psychiatric consultation and follow up as clinically required Please contact [phone] or [phone] if needs further assistance  Disposition: Refer to the psychiatric social service regarding appropriate psychiatric inpatient hospitalization for crisis stabilization, safety monitoring and medication management for depression and anxiety   Kumar Falwell,JANARDHAHA R. 09/06/2015 3:08 PM

## 2015-09-07 LAB — MAGNESIUM
MAGNESIUM: 2.1 mg/dL (ref 1.7–2.4)
Magnesium: 2.2 mg/dL (ref 1.7–2.4)

## 2015-09-07 LAB — BASIC METABOLIC PANEL
ANION GAP: 6 (ref 5–15)
BUN: 9 mg/dL (ref 6–20)
CHLORIDE: 109 mmol/L (ref 101–111)
CO2: 24 mmol/L (ref 22–32)
Calcium: 8.9 mg/dL (ref 8.9–10.3)
Creatinine, Ser: 1.02 mg/dL — ABNORMAL HIGH (ref 0.44–1.00)
GFR calc non Af Amer: >60 mL/min (ref >60)
Glucose, Bld: 103 mg/dL — ABNORMAL HIGH (ref 65–99)
POTASSIUM: 4.3 mmol/L (ref 3.5–5.1)
Sodium: 139 mmol/L (ref 135–145)

## 2015-09-07 NOTE — Discharge Summary (Addendum)
PATIENT DETAILS Name: [name] [name] Age: 44 y.o. Sex: female Date of Birth: [birth year] MRN: [id number]. Admitting Physician: Toy Baker, MD DHR:CBUL, Valla Leaver, MD  Admit Date: 09/05/2015 Discharge date: 09/09/2015  Recommendations for Outpatient Follow-up:  1. Age appropriate general health maintenance. 2. Optimize psychiatric medications.  PRIMARY DISCHARGE DIAGNOSIS:  Principal Problem:   Suicide attempt Active Problems:   Borderline personality disorder   Overdose, drug   Overdose      PAST MEDICAL HISTORY: Past Medical History  Diagnosis Date  . Heart murmur     asa child   . Anemia     hx of   . Depression   . Anxiety   . Anemia   . Chronic kidney disease     kidney stone   . Chronic kidney disease   . Renal insufficiency   . Blood dyscrasia   . Iron deficiency anemia, unspecified 11/15/2013  . Iron deficiency anemia, unspecified 11/15/2013    DISCHARGE MEDICATIONS: Current Discharge Medication List    START taking these medications   Details  ALPRAZolam (XANAX) 0.25 MG tablet Take 1 tablet (0.25 mg total) by mouth 3 (three) times daily as needed for anxiety.      CONTINUE these medications which have CHANGED   Details  !! FLUoxetine (PROZAC) 40 MG capsule Take 1 capsule (40 mg total) by mouth daily.     !! - Potential duplicate medications found. Please discuss with provider.    CONTINUE these medications which have NOT CHANGED   Details  benztropine (COGENTIN) 1 MG tablet Take 1 mg by mouth 2 (two) times daily.    !! FLUoxetine (PROZAC) 40 MG capsule Take 40 mg by mouth daily.    metFORMIN (GLUCOPHAGE) 500 MG tablet Take 500 mg by mouth daily with breakfast.     omeprazole (PRILOSEC) 40 MG capsule Take 40 mg by mouth daily.    simvastatin (ZOCOR) 20 MG tablet Take 20 mg by mouth at bedtime.    traZODone (DESYREL) 100 MG tablet Take 100 mg by mouth at bedtime.      !! - Potential duplicate medications found. Please discuss with  provider.    STOP taking these medications     hydrOXYzine (VISTARIL) 50 MG capsule      metoprolol tartrate (LOPRESSOR) 25 MG tablet         ALLERGIES:   Allergies  Allergen Reactions  . Enoxaparin Hives  . Augmentin [Amoxicillin-Pot Clavulanate] Itching, Swelling and Rash    Has patient had a PCN reaction causing immediate rash, facial/tongue/throat swelling, SOB or lightheadedness with hypotension: Yes Has patient had a PCN reaction causing severe rash involving mucus membranes or skin necrosis: No Has patient had a PCN reaction that required hospitalization No Has patient had a PCN reaction occurring within the last 10 years: Yes If all of the above answers are "NO", then may proceed with Cephalosporin use.  . Avelox [Moxifloxacin Hcl In Nacl] Itching, Swelling and Rash  . Sodium Hydroxide Rash    BRIEF HPI:  See H&P, Labs, Consult and Test reports for all details in brief, patient was admitted for Benadryl overdose following a suicidal attempt.  CONSULTATIONS:   psychiatry  PERTINENT RADIOLOGIC STUDIES: Dg Chest Port 1 View  09/05/2015   CLINICAL DATA:  Auditory hallucinations, overdose on Benadryl  EXAM: PORTABLE CHEST - 1 VIEW  COMPARISON:  05/29/2015  FINDINGS: Very limited inspiratory effect. This exaggerates the size of the cardiac silhouette. This also exaggerates pulmonary parenchymal markings. The right  diaphragm is elevated similar to prior study allowing for differences in inspiration. No consolidation.  IMPRESSION: No acute findings   Electronically Signed   By: Skipper Cliche M.D.   On: 09/05/2015 20:00     PERTINENT LAB RESULTS: CBC: No results for input(s): WBC, HGB, HCT, PLT in the last 72 hours. CMET CMP     Component Value Date/Time   NA 139 09/07/2015 0257   NA 141 10/04/2013 1214   K 4.1 09/08/2015 0504   K 4.3 10/04/2013 1214   CL 109 09/07/2015 0257   CO2 24 09/07/2015 0257   CO2 23 10/04/2013 1214   GLUCOSE 103* 09/07/2015 0257   GLUCOSE  103 10/04/2013 1214   BUN 9 09/07/2015 0257   BUN 11.9 10/04/2013 1214   CREATININE 1.02* 09/07/2015 0257   CREATININE 0.8 10/04/2013 1214   CALCIUM 8.9 09/07/2015 0257   CALCIUM 9.5 10/04/2013 1214   PROT 5.9* 09/06/2015 0325   PROT 7.1 10/04/2013 1214   ALBUMIN 3.4* 09/06/2015 0325   ALBUMIN 3.4* 10/04/2013 1214   AST 21 09/06/2015 0325   AST 15 10/04/2013 1214   ALT 13* 09/06/2015 0325   ALT 12 10/04/2013 1214   ALKPHOS 81 09/06/2015 0325   ALKPHOS 122 10/04/2013 1214   BILITOT 0.5 09/06/2015 0325   BILITOT 0.25 10/04/2013 1214   GFRNONAA >60 09/07/2015 0257   GFRAA >60 09/07/2015 0257    GFR Estimated Creatinine Clearance: 79.2 mL/min (by C-G formula based on Cr of 1.02). No results for input(s): LIPASE, AMYLASE in the last 72 hours. No results for input(s): CKTOTAL, CKMB, CKMBINDEX, TROPONINI in the last 72 hours. Invalid input(s): POCBNP No results for input(s): DDIMER in the last 72 hours. No results for input(s): HGBA1C in the last 72 hours. No results for input(s): CHOL, HDL, LDLCALC, TRIG, CHOLHDL, LDLDIRECT in the last 72 hours. No results for input(s): TSH, T4TOTAL, T3FREE, THYROIDAB in the last 72 hours.  Invalid input(s): FREET3 No results for input(s): VITAMINB12, FOLATE, FERRITIN, TIBC, IRON, RETICCTPCT in the last 72 hours. Coags: No results for input(s): INR in the last 72 hours.  Invalid input(s): PT Microbiology: Recent Results (from the past 240 hour(s))  MRSA PCR Screening     Status: None   Collection Time: 09/06/15 12:20 AM  Result Value Ref Range Status   MRSA by PCR NEGATIVE NEGATIVE Final    Comment:        The GeneXpert MRSA Assay (FDA approved for NASAL specimens only), is one component of a comprehensive MRSA colonization surveillance program. It is not intended to diagnose MRSA infection nor to guide or monitor treatment for MRSA infections.      BRIEF HOSPITAL COURSE:  Suicidal attempt with Benadryl /ongoing auditory  hallucination: QTC significantly improved with supportive measures. Awake and alert, continues to have ongoing auditory hallucinations. Seen by psychiatry, recommendations are to transfer to inpatient psychiatry when bed is available. Stable for transfer. Patient was monitored by a sitter at bedside at all times during this hospital stay.   History of depression: All anti-psychotics/anti-depressions were on hold on admission-have now resume. Further Psych meds will be deferred to Psych MD.   TODAY-DAY OF DISCHARGE:  Subjective:   [name] today has no headache,no chest abdominal pain,no new weakness tingling or numbness. Still appears to have auditory hallucinations. Sitter at bedside  Objective:   Blood pressure 112/78, pulse 99, temperature 98.1 F (36.7 C), temperature source Oral, resp. rate 21, height 5\' 4"  (1.626 m), weight 96.3 kg (  212 lb 4.9 oz), SpO2 97 %.  Intake/Output Summary (Last 24 hours) at 09/09/15 1538 Last data filed at 09/09/15 1512  Gross per 24 hour  Intake    120 ml  Output      0 ml  Net    120 ml   Filed Weights   09/05/15 1750 09/07/15 1039  Weight: 98.431 kg (217 lb) 96.3 kg (212 lb 4.9 oz)    Exam Awake Alert, No new F.N deficits,Startles easily [place].AT,PERRAL Supple Neck,No JVD, No cervical lymphadenopathy appriciated.  Symmetrical Chest wall movement, Good air movement bilaterally, CTAB RRR,No Gallops,Rubs or new Murmurs, No Parasternal Heave +ve B.Sounds, Abd Soft, Non tender, No organomegaly appriciated, No rebound -guarding or rigidity. No Cyanosis, Clubbing or edema, No new Rash or bruise  DISCHARGE CONDITION: Stable  DISPOSITION: Inpatient Psych  DISCHARGE INSTRUCTIONS:    Activity:  As tolerated  Get Medicines reviewed and adjusted: Please take all your medications with you for your next visit with your Primary MD  Please request your Primary MD to go over all hospital tests and procedure/radiological results at the follow up,  please ask your Primary MD to get all Hospital records sent to his/her office.  If you experience worsening of your admission symptoms, develop shortness of breath, life threatening emergency, suicidal or homicidal thoughts you must seek medical attention immediately by calling 911 or calling your MD immediately  if symptoms less severe.  You must read complete instructions/literature along with all the possible adverse reactions/side effects for all the Medicines you take and that have been prescribed to you. Take any new Medicines after you have completely understood and accpet all the possible adverse reactions/side effects.   Do not drive when taking Pain medications.   Do not take more than prescribed Pain, Sleep and Anxiety Medications  Special Instructions: If you have smoked or chewed Tobacco  in the last 2 yrs please stop smoking, stop any regular Alcohol  and or any Recreational drug use.  Wear Seat belts while driving.  Please note  You were cared for by a hospitalist during your hospital stay. Once you are discharged, your primary care physician will handle any further medical issues. Please note that NO REFILLS for any discharge medications will be authorized once you are discharged, as it is imperative that you return to your primary care physician (or establish a relationship with a primary care physician if you do not have one) for your aftercare needs so that they can reassess your need for medications and monitor your lab values.   Diet recommendation: Diabetic Diet Heart Healthy diet  Discharge Instructions    Diet - low sodium heart healthy    Complete by:  As directed      Diet Carb Modified    Complete by:  As directed      Increase activity slowly    Complete by:  As directed            Follow-up Information    Follow up with WEBB, CAROL D, MD. Schedule an appointment as soon as possible for a visit in 2 weeks.   Specialty:  Family Medicine   Contact  information:   [place] [address] [phone]       Total Time spent on discharge equals 25 minutes.  SignedOren Binet 09/09/2015 3:38 PM

## 2015-09-07 NOTE — Progress Notes (Signed)
PATIENT DETAILS Name: [name] [name] Age: 44 y.o. Sex: female Date of Birth: [birth year] Admit Date: 09/05/2015 Admitting Physician Toy Baker, MD YTK:PTWS, Valla Leaver, MD  Subjective: Standing up independently and brushing her teeth this morning. Still having auditory hallucination.   Assessment/Plan: Active Problems: Suicidal attempt with Benadryl /ongoing auditory hallucination: QBC significantly improved with supportive measures. Potassium and magnesium on the higher side of normal. Medically stable to transfer to behavioral [facility] when bed is available. Continue with sitter for now.  History of depression: All anti-psychotics/anti-depressions on hold. Await further recommendations from psychiatry.  Disposition: Remain inpatient-to inpatient psych when bed available  Antimicrobial agents  See below  Anti-infectives    None      DVT Prophylaxis:  SCD's  Code Status: Full code   Family Communication None at bedside  Procedures: None  CONSULTS:  psychiatry  Time spent 25 minutes-Greater than 50% of this time was spent in counseling, explanation of diagnosis, planning of further management, and coordination of care.  MEDICATIONS: Scheduled Meds: . Influenza vac split quadrivalent PF  0.5 mL Intramuscular Tomorrow-1000  . sodium chloride  3 mL Intravenous Q12H   Continuous Infusions: . sodium chloride 75 mL/hr at 09/07/15 0940   PRN Meds:.    PHYSICAL EXAM: Vital signs in last 24 hours: Filed Vitals:   09/07/15 0400 09/07/15 0425 09/07/15 0741 09/07/15 0947  BP: 97/51 136/81 123/66 120/81  Pulse: 58 62 72 80  Temp: 98.5 F (36.9 C)  98.6 F (37 C) 98.1 F (36.7 C)  TempSrc: Oral  Oral Oral  Resp: 16 18 19 20   Height:      Weight:      SpO2: 95% 95% 97% 98%    Weight change:  Filed Weights   09/05/15 1750  Weight: 98.431 kg (217 lb)   Body mass index is 37.23 kg/(m^2).   Gen Exam: Awake and alert with clear  speech. Neck: Supple, No JVD.   Chest: B/L Clear.   CVS: S1 S2 Regular, no murmurs.  Abdomen: soft, BS +, non tender, non distended.  Extremities: no edema, lower extremities warm to touch. Neurologic: Non Focal.   Skin: No Rash.   Wounds: N/A.   Intake/Output from previous day:  Intake/Output Summary (Last 24 hours) at 09/07/15 1038 Last data filed at 09/07/15 0940  Gross per 24 hour  Intake   1560 ml  Output      0 ml  Net   1560 ml     LAB RESULTS: CBC  Recent Labs Lab 09/05/15 1842 09/06/15 0325  WBC 7.9 7.2  HGB 12.5 12.6  HCT 38.5 38.5  PLT 296 288  MCV 85.9 86.3  MCH 27.9 28.3  MCHC 32.5 32.7  RDW 13.2 13.3    Chemistries   Recent Labs Lab 09/05/15 1842 09/06/15 0325 09/07/15 0257  NA 137 137 139  K 3.5 3.8 4.3  CL 102 105 109  CO2 24 25 24   GLUCOSE 121* 107* 103*  BUN 11 8 9   CREATININE 1.16* 1.02* 1.02*  CALCIUM 9.1 8.3* 8.9  MG  --  1.7 2.2    CBG:  Recent Labs Lab 09/05/15 1854  GLUCAP 101*    GFR Estimated Creatinine Clearance: 80.2 mL/min (by C-G formula based on Cr of 1.02).  Coagulation profile No results for input(s): INR, PROTIME in the last 168 hours.  Cardiac Enzymes No results for input(s): CKMB, TROPONINI,  MYOGLOBIN in the last 168 hours.  Invalid input(s): CK  Invalid input(s): POCBNP No results for input(s): DDIMER in the last 72 hours. No results for input(s): HGBA1C in the last 72 hours. No results for input(s): CHOL, HDL, LDLCALC, TRIG, CHOLHDL, LDLDIRECT in the last 72 hours.  Recent Labs  09/06/15 0325  TSH 2.219   No results for input(s): VITAMINB12, FOLATE, FERRITIN, TIBC, IRON, RETICCTPCT in the last 72 hours. No results for input(s): LIPASE, AMYLASE in the last 72 hours.  Urine Studies No results for input(s): UHGB, CRYS in the last 72 hours.  Invalid input(s): UACOL, UAPR, USPG, UPH, UTP, UGL, UKET, UBIL, UNIT, UROB, ULEU, UEPI, UWBC, URBC, UBAC, CAST, UCOM, BILUA  MICROBIOLOGY: Recent  Results (from the past 240 hour(s))  MRSA PCR Screening     Status: None   Collection Time: 09/06/15 12:20 AM  Result Value Ref Range Status   MRSA by PCR NEGATIVE NEGATIVE Final    Comment:        The GeneXpert MRSA Assay (FDA approved for NASAL specimens only), is one component of a comprehensive MRSA colonization surveillance program. It is not intended to diagnose MRSA infection nor to guide or monitor treatment for MRSA infections.     RADIOLOGY STUDIES/RESULTS: Dg Chest Port 1 View  09/05/2015   CLINICAL DATA:  Auditory hallucinations, overdose on Benadryl  EXAM: PORTABLE CHEST - 1 VIEW  COMPARISON:  05/29/2015  FINDINGS: Very limited inspiratory effect. This exaggerates the size of the cardiac silhouette. This also exaggerates pulmonary parenchymal markings. The right diaphragm is elevated similar to prior study allowing for differences in inspiration. No consolidation.  IMPRESSION: No acute findings   Electronically Signed   By: Skipper Cliche M.D.   On: 09/05/2015 20:00    Oren Binet, MD  [facility] Pager:[phone]  If 7PM-7AM, please contact night-coverage [email or web address] Password [email or web address] 09/07/2015, 10:38 AM   LOS: 2 days

## 2015-09-07 NOTE — Progress Notes (Signed)
09/07/15 Received report on patient from [place] from [place], Dx of Suicide attempt from taking a bottle of children Benadry , then driving to emergency room for treatment. IV site Right hand with normal saline at 75/hr, room air, Telemetry to monitor her heart,Diet regular,has a sitter with her.

## 2015-09-08 LAB — POTASSIUM: POTASSIUM: 4.1 mmol/L (ref 3.5–5.1)

## 2015-09-08 MED ORDER — PALIPERIDONE PALMITATE 234 MG/1.5ML IM SUSP
234.0000 mg | Freq: Once | INTRAMUSCULAR | Status: DC
  Filled 2015-09-08 (×3): qty 1.5

## 2015-09-08 MED ORDER — ALPRAZOLAM 0.25 MG PO TABS: 0.2500 mg | ORAL_TABLET | Freq: Three times a day (TID) | ORAL | Status: DC | PRN

## 2015-09-08 MED ORDER — FLUOXETINE HCL 40 MG PO CAPS: 40.0000 mg | ORAL_CAPSULE | Freq: Every day | ORAL | Status: DC

## 2015-09-08 MED ORDER — ALPRAZOLAM 0.25 MG PO TABS
0.2500 mg | ORAL_TABLET | Freq: Three times a day (TID) | ORAL | Status: DC | PRN
  Administered 2015-09-08 – 2015-09-09 (×4): 0.25 mg via ORAL
  Filled 2015-09-08 (×4): qty 1

## 2015-09-08 MED ORDER — BENZTROPINE MESYLATE 1 MG PO TABS
1.0000 mg | ORAL_TABLET | Freq: Two times a day (BID) | ORAL | Status: DC
  Administered 2015-09-08 – 2015-09-09 (×3): 1 mg via ORAL
  Filled 2015-09-08 (×3): qty 1

## 2015-09-08 MED ORDER — BENZTROPINE MESYLATE 1 MG PO TABS: 1.0000 mg | ORAL_TABLET | Freq: Two times a day (BID) | ORAL | Status: DC

## 2015-09-08 MED ORDER — TRAZODONE HCL 100 MG PO TABS
100.0000 mg | ORAL_TABLET | Freq: Every day | ORAL | Status: DC
  Administered 2015-09-08: 100 mg via ORAL
  Filled 2015-09-08: qty 1

## 2015-09-08 MED ORDER — LORAZEPAM 2 MG/ML IJ SOLN
0.5000 mg | Freq: Once | INTRAMUSCULAR | Status: AC
  Administered 2015-09-08: 0.5 mg via INTRAVENOUS

## 2015-09-08 MED ORDER — LORAZEPAM 2 MG/ML IJ SOLN
INTRAMUSCULAR | Status: AC
  Filled 2015-09-08: qty 1

## 2015-09-08 MED ORDER — FLUOXETINE HCL 20 MG PO CAPS
40.0000 mg | ORAL_CAPSULE | Freq: Every day | ORAL | Status: DC
  Administered 2015-09-08 – 2015-09-09 (×2): 40 mg via ORAL
  Filled 2015-09-08 (×2): qty 2

## 2015-09-08 NOTE — Progress Notes (Signed)
   09/08/15 1000  Clinical Encounter Type  Visited With Patient;Family;Patient and family together;Health care provider;Other (Comment)  Visit Type Initial;Follow-up;Psychological support;Behavioral Health;Trauma  Referral From Nurse  Consult/Referral To Nurse;Physician  Recommendations (Pt. needs Meds, Chaplain recommends Psy to move her to [facility])  Spiritual Encounters  Spiritual Needs Prayer;Emotional;Grief support  Stress Factors  Patient Stress Factors Family relationships;Loss;Loss of control;Major life changes  Advance Directives (For Healthcare)  Does patient have an advance directive? Yes;No  Would patient like information on creating an advanced directive? No - patient declined information    Chaplain arrived at Unit to follow a consult to 5W-26. Chaplain checked in with nurse. Nurse reported that the Pt. was extremely afraid and did not want to be touched. On arrival Chaplain met Mom in the waiting room and sat with Mom while and waited on security to enter the unit. Once security got to the unit Pt. ran out of her room terrify. Mom was able to walk Pt. back to the room.   Chaplain was saw that she was not on Meds.. And began to advocate for patient and family by expressing to the team of nurses how important it is to get her back on her Meds. Chaplain consulted with interdisciplinary team of healthcare professional to order meds and figure the best way a give meds to the Pt. Chaplain helps others to de-escalate the situation.   Chaplain stayed with Pt. until Meds. Were given and provided hospitality by making sure Pt. got something to eat   Chaplain provided chaplaincy education by giving information to Mom what Chaplain do. Lastly Chaplain provided silent and supportive presence for mom and Pt.
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
## 2015-09-08 NOTE — Care Management Note (Signed)
Case Management Note  Patient Details  Name: WYLLOW SEIGLER MRN: [id number] Date of Birth: Sep 02, 1971  Subjective/Objective:      Patient is medically ready to dc  to psych hospital, CSW following. Message left for Gena that patient is medically ready.             Action/Plan:   Expected Discharge Date:                  Expected Discharge Plan:  Psychiatric Hospital  In-House Referral:  Clinical Social Work  Discharge planning Services  CM Consult  Post Acute Care Choice:    Choice offered to:     DME Arranged:    DME Agency:     HH Arranged:    [place] Agency:     Status of Service:  Completed, signed off  Medicare Important Message Given:    Date Medicare IM Given:    Medicare IM give by:    Date Additional Medicare IM Given:    Additional Medicare Important Message give by:     If discussed at [place] of Stay Meetings, dates discussed:    Additional Comments:  Zenon Mayo, RN 09/08/2015, 11:37 AM

## 2015-09-08 NOTE — Care Management Important Message (Signed)
Important Message  Patient Details  Name: [name] [name] MRN: [id number] Date of Birth: 12-31-1970   Medicare Important Message Given:  Yes-second notification given    Nathen May 09/08/2015, 1:13 PM

## 2015-09-09 ENCOUNTER — Inpatient Hospital Stay (HOSPITAL_COMMUNITY)
Admission: AD | Admit: 2015-09-09 | Discharge: 2015-09-18 | DRG: 885 | Disposition: A | Discharge status: Home / Self Care | Payer: Medicare Other | Source: Intra-hospital | Attending: Psychiatry | Admitting: Psychiatry

## 2015-09-09 ENCOUNTER — Encounter (HOSPITAL_COMMUNITY): Payer: Self-pay

## 2015-09-09 DIAGNOSIS — Z8659 Personal history of other mental and behavioral disorders: Secondary | ICD-10-CM | POA: Diagnosis not present

## 2015-09-09 DIAGNOSIS — E221 Hyperprolactinemia: Secondary | ICD-10-CM | POA: Clinically undetermined

## 2015-09-09 DIAGNOSIS — F3112 Bipolar disorder, current episode manic without psychotic features, moderate: Secondary | ICD-10-CM | POA: Diagnosis present

## 2015-09-09 DIAGNOSIS — Z79899 Other long term (current) drug therapy: Secondary | ICD-10-CM

## 2015-09-09 DIAGNOSIS — R45851 Suicidal ideations: Secondary | ICD-10-CM | POA: Diagnosis present

## 2015-09-09 DIAGNOSIS — T1491XA Suicide attempt, initial encounter: Secondary | ICD-10-CM | POA: Diagnosis present

## 2015-09-09 DIAGNOSIS — K219 Gastro-esophageal reflux disease without esophagitis: Secondary | ICD-10-CM | POA: Diagnosis present

## 2015-09-09 DIAGNOSIS — E119 Type 2 diabetes mellitus without complications: Secondary | ICD-10-CM | POA: Diagnosis present

## 2015-09-09 DIAGNOSIS — F4310 Post-traumatic stress disorder, unspecified: Secondary | ICD-10-CM | POA: Diagnosis not present

## 2015-09-09 DIAGNOSIS — F259 Schizoaffective disorder, unspecified: Secondary | ICD-10-CM | POA: Diagnosis present

## 2015-09-09 DIAGNOSIS — F250 Schizoaffective disorder, bipolar type: Secondary | ICD-10-CM | POA: Diagnosis present

## 2015-09-09 LAB — GLUCOSE, CAPILLARY: Glucose-Capillary: 111 mg/dL — ABNORMAL HIGH (ref 65–99)

## 2015-09-09 MED ORDER — BENZTROPINE MESYLATE 1 MG PO TABS
1.0000 mg | ORAL_TABLET | Freq: Two times a day (BID) | ORAL | Status: DC
  Administered 2015-09-09 – 2015-09-11 (×4): 1 mg via ORAL
  Filled 2015-09-09 (×9): qty 1

## 2015-09-09 MED ORDER — LORAZEPAM 1 MG PO TABS
2.0000 mg | ORAL_TABLET | Freq: Four times a day (QID) | ORAL | Status: DC | PRN
  Administered 2015-09-12: 2 mg via ORAL
  Filled 2015-09-09: qty 2

## 2015-09-09 MED ORDER — RISPERIDONE 0.25 MG PO TABS
0.2500 mg | ORAL_TABLET | Freq: Two times a day (BID) | ORAL | Status: DC
  Administered 2015-09-09 – 2015-09-10 (×2): 0.25 mg via ORAL
  Filled 2015-09-09 (×5): qty 1

## 2015-09-09 MED ORDER — PANTOPRAZOLE SODIUM 40 MG PO TBEC
40.0000 mg | DELAYED_RELEASE_TABLET | Freq: Every day | ORAL | Status: DC
  Administered 2015-09-10 – 2015-09-18 (×9): 40 mg via ORAL
  Filled 2015-09-09 (×11): qty 1

## 2015-09-09 MED ORDER — FLUOXETINE HCL 20 MG PO CAPS
40.0000 mg | ORAL_CAPSULE | Freq: Every day | ORAL | Status: DC
  Administered 2015-09-10 – 2015-09-18 (×9): 40 mg via ORAL
  Filled 2015-09-09 (×11): qty 2

## 2015-09-09 MED ORDER — MAGNESIUM HYDROXIDE 400 MG/5ML PO SUSP
30.0000 mL | Freq: Every day | ORAL | Status: DC | PRN
  Administered 2015-09-15 – 2015-09-17 (×2): 30 mL via ORAL
  Filled 2015-09-09 (×2): qty 30

## 2015-09-09 MED ORDER — ALUM & MAG HYDROXIDE-SIMETH 200-200-20 MG/5ML PO SUSP: 30.0000 mL | ORAL | Status: DC | PRN

## 2015-09-09 MED ORDER — METFORMIN HCL 500 MG PO TABS
500.0000 mg | ORAL_TABLET | Freq: Every day | ORAL | Status: DC
  Administered 2015-09-10 – 2015-09-18 (×9): 500 mg via ORAL
  Filled 2015-09-09 (×11): qty 1

## 2015-09-09 MED ORDER — INSULIN ASPART 100 UNIT/ML ~~LOC~~ SOLN: 0.0000 [IU] | Freq: Every day | SUBCUTANEOUS | Status: DC

## 2015-09-09 MED ORDER — INSULIN ASPART 100 UNIT/ML ~~LOC~~ SOLN: 0.0000 [IU] | Freq: Three times a day (TID) | SUBCUTANEOUS | Status: DC

## 2015-09-09 MED ORDER — SIMVASTATIN 20 MG PO TABS
20.0000 mg | ORAL_TABLET | Freq: Every day | ORAL | Status: DC
  Administered 2015-09-09 – 2015-09-17 (×9): 20 mg via ORAL
  Filled 2015-09-09 (×12): qty 1

## 2015-09-09 MED ORDER — TRAZODONE HCL 100 MG PO TABS
100.0000 mg | ORAL_TABLET | Freq: Every day | ORAL | Status: DC
  Administered 2015-09-09 – 2015-09-10 (×2): 100 mg via ORAL
  Filled 2015-09-09 (×5): qty 1

## 2015-09-09 MED ORDER — ACETAMINOPHEN 325 MG PO TABS
650.0000 mg | ORAL_TABLET | Freq: Four times a day (QID) | ORAL | Status: DC | PRN
  Administered 2015-09-13 – 2015-09-15 (×2): 650 mg via ORAL
  Filled 2015-09-09 (×2): qty 2

## 2015-09-09 NOTE — Clinical Social Work Psych Note (Signed)
Discharge to [facility].  RN report number: [id number] Bed assignment: 657-8 (Bed ready after 5pm) Transportation: [place]  RN updated by Psych CSW.  Lubertha Sayres, [place] Clinical Social Work Department Orthopedics [phone]) and Surgical [phone])

## 2015-09-09 NOTE — Progress Notes (Signed)
Patient was discharged to [facility]  by MD order; discharged instructions review and RN call for report on the unit at [phone]). Patient's mother at bedside; IV DIC; patient will be transported to the hospital via [place].

## 2015-09-09 NOTE — Progress Notes (Signed)
1:1 initiated per MD order. Pt unable to contract for safety at this time. Reports suicidal ideation, "I do not have a gun." "I do whatever [place] tells me to do." Pt actively visually, auditory, command hallucinations. Pt presents with increased anxiety, thought blocking. Reports she is afraid of female staff. Will continue 1:1 for safety.

## 2015-09-09 NOTE — Progress Notes (Signed)
RN 1:1 note 2200  D: Pt in the dayroom with sitter and writer within arms length. Pt is actively hallucinating. Hallucinations are command in nature. Per pt, she is currently feeding Lemurs honeysuckles. Per pt, she was allowed to do such because "[place]" said it was okay. Pt reported to the sitter that feeding the Lemurs is a reward for her not disclosing a secret from "[place]" to staff. Pt was encouraged to disclose of such information for staff to better protect her.  A: 1:1 observation remains for pt's safety remains. Writer administered scheduled medications to pt, per MD orders. Continued support and availability as needed was extended to this pt. Staff continue to monitor pt with q14min checks.  R:Pt receptive to treatment. Pt remains safe at this time.

## 2015-09-09 NOTE — Progress Notes (Signed)
Adult Psychoeducational Group Note  Date:  09/09/2015 Time:  8:15 PM  Group Topic/Focus:  Wrap-Up Group:   The focus of this group is to help patients review their daily goal of treatment and discuss progress on daily workbooks.  Participation Level:  None  Participation Quality:  Pt did not participate  Affect:  Flat  Cognitive:  Lacking  Insight: Limited  Engagement in Group:  Poor  Modes of Intervention:  Discussion  Additional Comments:  Pt attended group but did not participate at all. When asked about her day, pt gave this writer a long, irate stare. Pt was not attentive to anyone else speaking during group.   Lincoln Brigham 09/09/2015, 8:28 PM
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
## 2015-09-09 NOTE — Progress Notes (Signed)
Patient accepted to [facility] room 501/1 by Dr. Louretta Shorten to Dr. Charlcie Cradle service.  Clayborne Dana, RN
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
## 2015-09-09 NOTE — Clinical Social Work Note (Addendum)
Clinical referral sent to the following facilities:  [facility]- patient's preference [facility] referral sent [facility] under review [facility]- referral sent message left to follow-up on referral [facility]- at [facility]- no beds expecting discharges referral received [facility]- at [facility], [place] [phone]  [facility] ([address] 1[address]8) Clinical Social Worker

## 2015-09-09 NOTE — Progress Notes (Signed)
PATIENT DETAILS Name: [name] [name] Age: 44 y.o. Sex: female Date of Birth: 06-Jan-1971 Admit Date: 09/05/2015 Admitting Physician Toy Baker, MD HYI:FOYD, Valla Leaver, MD  Subjective: Less startled today-still with auditory hallucination. Her at bedside  Assessment/Plan: Active Problems: Suicidal attempt with Benadryl /ongoing auditory hallucination: Continues to have auditory hallucination, awaiting inpatient psych bed. Resumed trazodone, fluoxetine and Cogentin. Psych following.  History of depression: as above.  Disposition: Remain inpatient-to inpatient psych when bed available  Antimicrobial agents  See below  Anti-infectives    None      DVT Prophylaxis:  SCD's  Code Status: Full code   Family Communication None at bedside  Procedures: None  CONSULTS:  psychiatry  Time spent 25 minutes-Greater than 50% of this time was spent in counseling, explanation of diagnosis, planning of further management, and coordination of care.  MEDICATIONS: Scheduled Meds: . benztropine  1 mg Oral BID  . FLUoxetine  40 mg Oral Daily  . Influenza vac split quadrivalent PF  0.5 mL Intramuscular Tomorrow-1000  . paliperidone  234 mg Intramuscular Once  . sodium chloride  3 mL Intravenous Q12H  . traZODone  100 mg Oral QHS   Continuous Infusions:   PRN Meds:.    PHYSICAL EXAM: Vital signs in last 24 hours: Filed Vitals:   09/08/15 1257 09/08/15 2206 09/09/15 0422 09/09/15 1327  BP: 110/77 99/59 107/85 112/78  Pulse: 124 102 116 99  Temp: 98.3 F (36.8 C) 98.3 F (36.8 C) 97.8 F (36.6 C) 98.1 F (36.7 C)  TempSrc: Oral Oral Oral Oral  Resp: 20 18 18 21   Height:      Weight:      SpO2: 99% 98% 94% 97%    Weight change:  Filed Weights   09/05/15 1750 09/07/15 1039  Weight: 98.431 kg (217 lb) 96.3 kg (212 lb 4.9 oz)   Body mass index is 36.42 kg/(m^2).   Gen Exam: Awake and alert Neck: Supple, No JVD.   Chest: B/L Clear.     CVS: S1 S2 Regular, no murmurs.  Abdomen: soft, BS +, non tender, non distended.  Extremities: no edema, lower extremities warm to touch. Neurologic: Non Focal.   Skin: No Rash.   Wounds: N/A.   Intake/Output from previous day:  Intake/Output Summary (Last 24 hours) at 09/09/15 1331 Last data filed at 09/09/15 1020  Gross per 24 hour  Intake     60 ml  Output      0 ml  Net     60 ml     LAB RESULTS: CBC  Recent Labs Lab 09/05/15 1842 09/06/15 0325  WBC 7.9 7.2  HGB 12.5 12.6  HCT 38.5 38.5  PLT 296 288  MCV 85.9 86.3  MCH 27.9 28.3  MCHC 32.5 32.7  RDW 13.2 13.3    Chemistries   Recent Labs Lab 09/05/15 1842 09/06/15 0325 09/07/15 0257 09/07/15 1111 09/08/15 0504  NA 137 137 139  --   --   K 3.5 3.8 4.3  --  4.1  CL 102 105 109  --   --   CO2 24 25 24   --   --   GLUCOSE 121* 107* 103*  --   --   BUN 11 8 9   --   --   CREATININE 1.16* 1.02* 1.02*  --   --   CALCIUM 9.1 8.3* 8.9  --   --  MG  --  1.7 2.2 2.1  --     CBG:  Recent Labs Lab 09/05/15 1854  GLUCAP 101*    GFR Estimated Creatinine Clearance: 79.2 mL/min (by C-G formula based on Cr of 1.02).  Coagulation profile No results for input(s): INR, PROTIME in the last 168 hours.  Cardiac Enzymes No results for input(s): CKMB, TROPONINI, MYOGLOBIN in the last 168 hours.  Invalid input(s): CK  Invalid input(s): POCBNP No results for input(s): DDIMER in the last 72 hours. No results for input(s): HGBA1C in the last 72 hours. No results for input(s): CHOL, HDL, LDLCALC, TRIG, CHOLHDL, LDLDIRECT in the last 72 hours. No results for input(s): TSH, T4TOTAL, T3FREE, THYROIDAB in the last 72 hours.  Invalid input(s): FREET3 No results for input(s): VITAMINB12, FOLATE, FERRITIN, TIBC, IRON, RETICCTPCT in the last 72 hours. No results for input(s): LIPASE, AMYLASE in the last 72 hours.  Urine Studies No results for input(s): UHGB, CRYS in the last 72 hours.  Invalid input(s): UACOL,  UAPR, USPG, UPH, UTP, UGL, UKET, UBIL, UNIT, UROB, ULEU, UEPI, UWBC, URBC, UBAC, CAST, UCOM, BILUA  MICROBIOLOGY: Recent Results (from the past 240 hour(s))  MRSA PCR Screening     Status: None   Collection Time: 09/06/15 12:20 AM  Result Value Ref Range Status   MRSA by PCR NEGATIVE NEGATIVE Final    Comment:        The GeneXpert MRSA Assay (FDA approved for NASAL specimens only), is one component of a comprehensive MRSA colonization surveillance program. It is not intended to diagnose MRSA infection nor to guide or monitor treatment for MRSA infections.     RADIOLOGY STUDIES/RESULTS: Dg Chest Port 1 View  09/05/2015   CLINICAL DATA:  Auditory hallucinations, overdose on Benadryl  EXAM: PORTABLE CHEST - 1 VIEW  COMPARISON:  05/29/2015  FINDINGS: Very limited inspiratory effect. This exaggerates the size of the cardiac silhouette. This also exaggerates pulmonary parenchymal markings. The right diaphragm is elevated similar to prior study allowing for differences in inspiration. No consolidation.  IMPRESSION: No acute findings   Electronically Signed   By: Skipper Cliche M.D.   On: 09/05/2015 20:00    Oren Binet, MD  [facility] Pager:[phone]  If 7PM-7AM, please contact night-coverage [email or web address] Password [email or web address] 09/09/2015, 1:31 PM   LOS: 4 days

## 2015-09-10 ENCOUNTER — Encounter (HOSPITAL_COMMUNITY): Payer: Self-pay | Admitting: Psychiatry

## 2015-09-10 DIAGNOSIS — F259 Schizoaffective disorder, unspecified: Secondary | ICD-10-CM | POA: Diagnosis present

## 2015-09-10 DIAGNOSIS — F250 Schizoaffective disorder, bipolar type: Secondary | ICD-10-CM | POA: Diagnosis present

## 2015-09-10 DIAGNOSIS — F4310 Post-traumatic stress disorder, unspecified: Secondary | ICD-10-CM | POA: Diagnosis present

## 2015-09-10 DIAGNOSIS — Z8659 Personal history of other mental and behavioral disorders: Secondary | ICD-10-CM

## 2015-09-10 LAB — CBC WITH DIFFERENTIAL/PLATELET
BASOS ABS: 0.1 10*3/uL (ref 0.0–0.1)
Basophils Relative: 1 %
EOS ABS: 0 10*3/uL (ref 0.0–0.7)
EOS PCT: 0 %
HCT: 41.3 % (ref 36.0–46.0)
Hemoglobin: 13.5 g/dL (ref 12.0–15.0)
LYMPHS ABS: 2.3 10*3/uL (ref 0.7–4.0)
Lymphocytes Relative: 24 %
MCH: 28.5 pg (ref 26.0–34.0)
MCHC: 32.7 g/dL (ref 30.0–36.0)
MCV: 87.1 fL (ref 78.0–100.0)
Monocytes Absolute: 0.8 10*3/uL (ref 0.1–1.0)
Monocytes Relative: 8 %
Neutro Abs: 6.5 10*3/uL (ref 1.7–7.7)
Neutrophils Relative %: 67 %
PLATELETS: 331 10*3/uL (ref 150–400)
RBC: 4.74 MIL/uL (ref 3.87–5.11)
RDW: 13.5 % (ref 11.5–15.5)
WBC: 9.8 10*3/uL (ref 4.0–10.5)

## 2015-09-10 LAB — GLUCOSE, CAPILLARY
GLUCOSE-CAPILLARY: 120 mg/dL — AB (ref 65–99)
GLUCOSE-CAPILLARY: 105 mg/dL — AB (ref 65–99)
GLUCOSE-CAPILLARY: 109 mg/dL — AB (ref 65–99)

## 2015-09-10 MED ORDER — CLOZAPINE 25 MG PO TABS
25.0000 mg | ORAL_TABLET | Freq: Every day | ORAL | Status: DC
  Administered 2015-09-10 – 2015-09-11 (×2): 25 mg via ORAL
  Filled 2015-09-10 (×3): qty 1

## 2015-09-10 MED ORDER — RISPERIDONE 1 MG PO TABS
1.0000 mg | ORAL_TABLET | Freq: Two times a day (BID) | ORAL | Status: DC
  Filled 2015-09-10 (×2): qty 1

## 2015-09-10 MED ORDER — RISPERIDONE 2 MG PO TABS
2.0000 mg | ORAL_TABLET | Freq: Three times a day (TID) | ORAL | Status: DC | PRN
  Administered 2015-09-13 (×2): 2 mg via ORAL
  Filled 2015-09-10 (×2): qty 1

## 2015-09-10 MED ORDER — RISPERIDONE 0.5 MG PO TABS
0.5000 mg | ORAL_TABLET | Freq: Two times a day (BID) | ORAL | Status: DC
  Administered 2015-09-10 – 2015-09-11 (×2): 0.5 mg via ORAL
  Filled 2015-09-10 (×4): qty 1

## 2015-09-10 MED ORDER — LOPERAMIDE HCL 2 MG PO CAPS
2.0000 mg | ORAL_CAPSULE | ORAL | Status: DC | PRN
  Administered 2015-09-10: 2 mg via ORAL
  Filled 2015-09-10: qty 1

## 2015-09-10 MED ORDER — PALIPERIDONE PALMITATE 234 MG/1.5ML IM SUSP: 234.0000 mg | INTRAMUSCULAR | Status: DC

## 2015-09-10 MED ORDER — OLANZAPINE 10 MG IM SOLR: 5.0000 mg | Freq: Three times a day (TID) | INTRAMUSCULAR | Status: DC | PRN

## 2015-09-10 MED ORDER — PALIPERIDONE PALMITATE 156 MG/ML IM SUSP
156.0000 mg | INTRAMUSCULAR | Status: DC
  Administered 2015-09-10: 156 mg via INTRAMUSCULAR

## 2015-09-10 MED ORDER — PALIPERIDONE PALMITATE 234 MG/1.5ML IM SUSP
234.0000 mg | INTRAMUSCULAR | Status: DC
  Filled 2015-09-10: qty 1.5
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
## 2015-09-10 NOTE — Progress Notes (Addendum)
DAR 1:1 note for 1400  [name] [name] remains on 1:1 observation.  She has been pacing the unit.  She ignores the men on the unit and doesn't like for them to be around her.  Briefly attended morning group but was unable to stay.  She was laying down in her room resting quietly.  She continues to respond to internal stimuli.  She is alert and oriented to person at this time.  She appears to be in no physical distress.  1:1 observation maintained for pt safety.

## 2015-09-10 NOTE — Plan of Care (Signed)
Problem: Alteration in mood Goal: LTG-Patient reports reduction in suicidal thoughts (Patient reports reduction in suicidal thoughts and is able to verbalize a safety plan for whenever patient is feeling suicidal)  Outcome: Not Progressing [name] [name] continues to report that her friend "[place]" continues to tell her to do things.  She hasn't harmed self but [place] continues to tell her to harm herself.

## 2015-09-10 NOTE — Progress Notes (Signed)
1:1 Note D: Patient in her room resting but awake at the moment.  Patient states states, "[place] tells me what I can do an can't do.  Patient agreed to take her bedtime medications but refused to have CBG taken.  Patient states, "[place] told me I do not need that done anymore.  Patient remains on 1:1 for safety. A:  Patient remains on 1:1. R:  Patient is safe on the unit at this time.

## 2015-09-10 NOTE — BHH Group Notes (Signed)
[facility] LCSW Group Therapy  09/10/2015 2:41 PM  Type of Therapy: Group Therapy  Participation Level: Invited. Attended very briefly and then left.  Participation Quality: Attentive  Affect: Flat  Cognitive: Oriented  Insight: Limited  Engagement in Therapy:Not Engaged  Modes of Intervention: Discussion and Socialization  Summary of Progress/Problems: Shanon Brow from the [facility] was here to tell his story of recovery and play his guitar. Pt came to group and was lethargic and uninterested in the discussion. Pt left after a few minutes of group. Kara Mead. Marshell Levan 09/10/2015 2:41 PM

## 2015-09-10 NOTE — Progress Notes (Signed)
RN 1:1 Note 0200 D: Pt in bed resting with eyes closed. Respirations even and unlabored. Pt appears to be in no signs of distress at this time. A: 1:1 observation remains for pt's safety.  R: Pt remains safe at this time.

## 2015-09-10 NOTE — Progress Notes (Signed)
RN 1:1 Note 0600 D: Pt in bed resting with eyes closed. Respirations even and unlabored. Pt appears to be in no signs of distress at this time. A: 1:1 observation remains for pt's safety. R: Pt remains safe at this time.

## 2015-09-10 NOTE — BHH Group Notes (Signed)
[facility] LCSW Aftercare Discharge Planning Group Note   09/10/2015 11:04 AM  Participation Quality:  Invited.Chose not to attend.   Georga Kaufmann
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
## 2015-09-10 NOTE — Progress Notes (Signed)
1:1 DAR Note for 1800 [place] is up in the day room with sitter by her side and 1:1 continues for safety.  She is eating in the day room and stated to writer that "[place] is allowing me to eat."   She continues to talk to her voices and has to touch staffs arms to make sure they are real.  .  She would not allow the techs to obtain her blood glucose level but allowed writer to get her blood glucose and it was 109.  No insulin coverage needed.  She appears to be in no physical distress.  She complained of diarrhea and imodium ordered and given with good relief.   She denies any suicidal thoughts but admits that [place] tell her to harm herself.  No attempts to self harm.  Encouraged her to attend group/unit activities.  1:1 continued for safety.

## 2015-09-10 NOTE — Progress Notes (Signed)
D: Patient sitting in the dayroom on first approach.  Patient presents childlike and has her hands inside of her sleeves.  Patient states, "[place] tells her what to do."  Patient states [place] is a good person and [place] has been around since she was 44 years old.  Patient states [place] was standing in the room with [place].  Patient states [place] has red hair and is wearing a sun dress.  Patient touched writers arm to see if writer was real.  Patient denies SI/HI and is positive for auditory and visual hallucinations.  Patient cannot verbally contract for safety.  Patient also states [place] allowed her to eat dinner tonight but would not allow her to eat a snack. A: Staff to monitor Q 15 mins for safety.  Encouragement and support offered.  Scheduled medications administered per orders. R: Patient remains safe on the unit.  Patient attended group tonight.  Patient visible on the unit.  Patient taking administered medications

## 2015-09-10 NOTE — Progress Notes (Signed)
Adult Psychoeducational Group Note  Date:  09/10/2015 Time:  8:20 PM  Group Topic/Focus:  Wrap-Up Group:   The focus of this group is to help patients review their daily goal of treatment and discuss progress on daily workbooks.  Participation Level:  None  Participation Quality:  Resistant  Affect:  Resistant  Cognitive:  Lacking  Insight: None  Engagement in Group:  Poor  Modes of Intervention:  Discussion  Additional Comments:  Pt attended wrap-up group but did not participate and was not attentive. When encouraged her to participate, she acted as if she was ignoring this Probation officer.   Lincoln Brigham 09/10/2015, 8:43 PM

## 2015-09-10 NOTE — Progress Notes (Signed)
Patient ID: [name] [name], female   DOB: 1971/11/15, 44 y.o.   MRN: [id number] PER STATE REGULATIONS 482.30  THIS CHART WAS REVIEWED FOR MEDICAL NECESSITY WITH RESPECT TO THE PATIENT'S ADMISSION/DURATION OF STAY.  NEXT REVIEW DATE:09/13/15  Roma Schanz, RN, BSN CASE MANAGER

## 2015-09-10 NOTE — Progress Notes (Signed)
DAR-1:1 note 1000 [name] [name] is pacing on the unit.  She continues to report that "[place]" keeps telling her that she needs to hurt herself and [place] won't let her eat.  She states that she is able to drink water but could only have 2 bites of her breakfast.  She talks about [place] and she has to feed them  honeysuckle.  She talks quietly with staff but has to touch your arm to make sure that you are real.  She was talkng with her mother on the phone and states that her mother is going to come and visit today.  She was able to give her code number to her mother.  1:1 observation maintained for patient's safety.  She appears to be in no physical distress. 1:1 continues for patient safety.

## 2015-09-11 DIAGNOSIS — E221 Hyperprolactinemia: Secondary | ICD-10-CM | POA: Clinically undetermined

## 2015-09-11 LAB — PROLACTIN: Prolactin: 108.5 ng/mL — ABNORMAL HIGH (ref 4.8–23.3)

## 2015-09-11 LAB — GLUCOSE, CAPILLARY: Glucose-Capillary: 95 mg/dL (ref 65–99)

## 2015-09-11 MED ORDER — RISPERIDONE 0.25 MG PO TABS: 0.2500 mg | ORAL_TABLET | Freq: Every evening | ORAL | Status: DC

## 2015-09-11 MED ORDER — TRAZODONE HCL 50 MG PO TABS: 50.0000 mg | ORAL_TABLET | Freq: Every evening | ORAL | Status: DC | PRN

## 2015-09-11 MED ORDER — ARIPIPRAZOLE 5 MG PO TABS
5.0000 mg | ORAL_TABLET | Freq: Every evening | ORAL | Status: DC
  Administered 2015-09-11 – 2015-09-17 (×7): 5 mg via ORAL
  Filled 2015-09-11 (×9): qty 1

## 2015-09-11 MED ORDER — TRAZODONE HCL 50 MG PO TABS: 50.0000 mg | ORAL_TABLET | Freq: Every day | ORAL | Status: DC

## 2015-09-11 MED ORDER — BENZTROPINE MESYLATE 1 MG PO TABS
1.0000 mg | ORAL_TABLET | Freq: Every evening | ORAL | Status: DC
  Administered 2015-09-12 – 2015-09-17 (×6): 1 mg via ORAL
  Filled 2015-09-11 (×8): qty 1

## 2015-09-11 NOTE — [facility] Group Notes (Signed)
[facility] LCSW Group Therapy  09/11/2015 1:49 PM  Type of Therapy: Process Group Therapy  Participation Level:Not  Active  Participation Quality: Appropriate  Affect: Flat  Cognitive: Oriented  Insight: Limited   Engagement in Group: Limited  Engagement in Therapy: Limited  Modes of Intervention: Activity, Clarification, Education, Problem-solving and Support  Summary of Progress/Problems: Today's group focused on the topic of community.  Pt was alert and present throughout the group but did not talk or participate.   Kara Mead. Marshell Levan, MSW Intern 09/11/2015 1:49 PM

## 2015-09-11 NOTE — Progress Notes (Signed)
DAR 1:1 Note 1800 [name] [name] has been out of her room much of the afternoon.  She has stayed in the day room talking about her mother, daughter and pets at home.  She states that she is really upset that her daughter won't have anything to do with her.  She talked about her upcoming divorce and states that the paperwork will be complete 09/15/15.  "I think my husband had someone else while he was still with me."  She reports that she still hears [place] talking to her but she hasn't been telling her to harm herself.  She denies any pain and appears to be in no physical distress.  She was able to eat supper on the unit and was excited about have a "coke."  She reports that she is worried about taking her medication tonight because she felt like she took too much and "I don't want to sleep all morning."  Reviewed medications with her and she is glad that she isn't taking as much as she did last night.  She attended afternoon group.  She continues on 1:1 for safety.  Sitter at side.  Encouraged [name] [name] to participate in group and unit activities.

## 2015-09-11 NOTE — Progress Notes (Signed)
Was not able to attend Karaoke wrap up group, remained on the unit.

## 2015-09-11 NOTE — Progress Notes (Signed)
RN 1:1 Note 2200  D: Pt in bed resting in supine position. Respirations even and unlabored. Pt appears to be in no signs of distress at this time.  A: 1:1 observations remains for pt's safety. R: Pt remains safe at this time.

## 2015-09-11 NOTE — Progress Notes (Signed)
DAR 1:1 note 1000 [name] [name] has been in her room much of the morning resting with her eyes closed.  She reports that she is sleepy from the new medications that were prescribed last night.  She states that [name] [name] continues to talk to her but mainly she is just playing music in her head.  She denies that [name] [name] is telling her to harm herself at this time.  1:1 continues for safety.  Sitter by her side.  [name] [name] ate a muffin for breakfast this morning.  She took her medications without difficulty. Q 15 minute checks maintained and [name] [name] will remain on 1:1.

## 2015-09-11 NOTE — Progress Notes (Signed)
RN 1:1 Note  D: Patient resting in bed with eyes closed.  Respirations even and unlabored.  Patient appears to be in no apparent distress. A: Staff to monitor Q 15 mins for safety.  Patient remains om 1:1 for safety. R:Patient remains safe on the unit.
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
## 2015-09-11 NOTE — Plan of Care (Signed)
Problem: Alteration in mood Goal: STG-Patient is able to discuss feelings and issues (Patient is able to discuss feelings and issues leading to depression)  Outcome: Not Progressing [name] [name] continues to have 1:1 sitter. She still isn't able to communicates her needs.  She continues to hear the voice of "[place]" who tells her to do things.  We will continue to monitor the progress towards her goals.

## 2015-09-11 NOTE — [facility] Group Notes (Signed)
[facility] Group Notes:  (Nursing/MHT/Case Management/Adjunct)  Date:  09/11/2015  Time:  11:24 AM  Type of Therapy:  Nurse Education  Participation Level:  Did Not Attend  Participation Quality:  Did not attend.  Affect:  Did not attend  Cognitive:  Did not attend  Insight:    Engagement in Group:  Did not attend  Modes of Intervention:    Summary of Progress/Problems:  Group topic was leisure and lifestyle changes.  Discussed goal setting and the importance in improving/developing useful coping skills.  Margel was resting in her room and did not attend group.  Barbette Or Aianna Fahs 09/11/2015, 11:24 AM

## 2015-09-11 NOTE — Progress Notes (Signed)
DAR 1:1 Note 1400 [name] [name] has been up on the unit after sleeping much of the morning.  She got up and took a shower.  She reported that the shower felt "really good."  She attended afternoon group and was trying to work on coloring her [place] that her mother brought her in last night.  She ate small amount at lunch.  "I ate my tater tots."  She is still hearing [place] talk to her but it is more "she is playing the radio for me."   She denied suicidal thoughts.  She completed her self inventory and reports that her depression was 7/10, hopelessness 6/10 and her anxiety 7/10.  She reports that she is having problems with her hernia and writer notified Dr. Shea Evans and Ander Purpura reported that she went to urgent care recently to have them evaluated but they referred her back to her primary care doctor.  She stated that her pain was 8/10 but she was sleeping all morning and no prn required.  1:1 continued for safety.  She appears to be in no physical distress.  We will continue to monitor 1:1 and encourage her to participate in group and unit activities.

## 2015-09-11 NOTE — Progress Notes (Deleted)
2115 D: Pt at medication window receiving her qhs medications. Pt denies any concerns at this time. Pt observed talking to herself at a

## 2015-09-11 NOTE — Plan of Care (Signed)
Problem: Alteration in thought process Goal: LTG-Patient is able to perceive the environment accurately Outcome: Not Progressing Patient remains delusional and touches staff members to make sure they are real.  Patient also is having active auditory and visual hallucinations.

## 2015-09-11 NOTE — Progress Notes (Signed)
Rn 1:1 Note  D: Patient resting in bed with eyes closed.  Respirations even and unlabored.  Patient appears to be in no apparent distress. A: Staff to monitor Q 15 mins for safety.   R:Patient remains safe on the unit.

## 2015-09-12 MED ORDER — PROPRANOLOL HCL 10 MG PO TABS
10.0000 mg | ORAL_TABLET | Freq: Two times a day (BID) | ORAL | Status: DC
  Administered 2015-09-12 – 2015-09-18 (×12): 10 mg via ORAL
  Filled 2015-09-12 (×16): qty 1

## 2015-09-12 MED ORDER — LORAZEPAM 1 MG PO TABS: 1.0000 mg | ORAL_TABLET | Freq: Four times a day (QID) | ORAL | Status: DC | PRN

## 2015-09-12 MED ORDER — CLOZAPINE 25 MG PO TABS
50.0000 mg | ORAL_TABLET | Freq: Every day | ORAL | Status: DC
  Administered 2015-09-12 – 2015-09-17 (×5): 50 mg via ORAL
  Filled 2015-09-12 (×8): qty 2

## 2015-09-12 MED ORDER — LORAZEPAM 1 MG PO TABS
1.0000 mg | ORAL_TABLET | Freq: Four times a day (QID) | ORAL | Status: DC | PRN
  Administered 2015-09-13 (×2): 1 mg via ORAL
  Filled 2015-09-12 (×2): qty 1

## 2015-09-12 MED ORDER — HYDROXYZINE HCL 25 MG PO TABS
25.0000 mg | ORAL_TABLET | Freq: Four times a day (QID) | ORAL | Status: DC | PRN
  Administered 2015-09-13: 25 mg via ORAL
  Filled 2015-09-12: qty 1

## 2015-09-12 MED ORDER — MENTHOL 3 MG MT LOZG: 1.0000 | LOZENGE | OROMUCOSAL | Status: DC | PRN

## 2015-09-12 NOTE — Progress Notes (Signed)
Adult Psychoeducational Group Note  Date:  09/12/2015 Time:  11:37 PM  Group Topic/Focus:  Wrap-Up Group:   The focus of this group is to help patients review their daily goal of treatment and discuss progress on daily workbooks.  Participation Level:  Active  Participation Quality:  Appropriate  Affect:  Appropriate  Cognitive:  Appropriate  Insight: Appropriate  Engagement in Group:  Engaged  Modes of Intervention:  Discussion  Additional Comments: The patient expressed that she rates her day a 5.The patient also said that her mother visited her for most of day.  Nash Shearer 09/12/2015, 11:37 PM
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
## 2015-09-12 NOTE — Tx Team (Signed)
Interdisciplinary Treatment Plan Update (Adult)  Date:  09/12/2015 Time Reviewed:  3:45 PM  Progress in Treatment: Attending groups: Yes. Participating in groups: Yes. Taking medication as prescribed:  Yes. Tolerating medication:  Yes. Family/Significant othe contact made:  Yes, individual(s) contacted:  Gearldine Shown (mother) [phone] Patient understands diagnosis:  Yes, as evidenced by seeking help with paranoia Discussing patient identified problems/goals with staff:  Yes Medical problems stabilized or resolved:  Yes Denies suicidal/homicidal ideation: Yes. Issues/concerns per patient self-inventory: No. Other:  New problem(s) identified: NA  Discharge Plan or Barriers: Pt will return home to live with her mother and continuing receiving serviced from ACT team.  Reason for Continuation of Hospitalization: Delusions  Hallucinations Suicidal ideation  Comments:  [name] [name] is a 44 y.o. Caucasian female, divorced , lives with her mother [name] [name] , has a past medical history of Schizoaffective disorder , several suicide attempts , Heart murmur; Anemia;Chronic kidney disease.Patient as per initial notes in EHR , over dosed on 590 mg of children's Benadryl on 09/05/2015 and drove herself to the [place] . Pt reported having AH of her friend '[place] " who asked her to kill herself. Pt also had a suicide note with her with phone numbers of people who needed to be contacted along with her suicide note .   Estimated length of stay: 4-5 days  New goal(s):  Review of initial/current patient goals per problem list:  1. Goal(s): Patient will participate in aftercare plan  Met:Yes  Target date: at discharge  As evidenced by: Patient will participate within aftercare plan AEB aftercare provider and housing plan at discharge being identified.  09/12/2015: Pt is agreeable to continuing to work with her ACT team.   2. Goal (s): Patient will exhibit decreased depressive symptoms  and suicidal ideations.  FVC:[facility] Progressing  Target date: at discharge  As evidenced by: Patient will utilize self rating of depression at 3 or below and demonstrate decreased signs of depression or be deemed stable for discharge by MD. 09/12/2015: Denies symptoms of depression and reports that the voices are currently not telling her to kill herself. Will continue to monitor.   3. Goal(s): Patient will demonstrate decreased signs and symptoms of anxiety.  Met:Yes  Target date: at discharge  As evidenced by: Patient will utilize self rating of anxiety at 3 or below and demonstrated decreased signs of anxiety, or be deemed stable for discharge by MD  09/12/2015: Pt reports that anxiety is not an issue for her today.  4. Goal(s): Patient will demonstrate decreased signs of psychosis.  Met: No  Target date:at discharge  As evidenced by: Patient will demonstrate decreased signs of psychosis as evidenced by a reduction in AVH, paranoia, and/or delusions.   09/12/2015: Pt continues to report hearing "[place]".     Attendees: Patient: [name] [name] 09/12/2015 3:45 PM  Family:  Gearldine Shown 09/12/2015 3:45 PM  Physician:  Dr. Ursula Alert MD 09/12/2015 3:45 PM  Nursing: Manuella Ghazi, RN 09/12/2015 3:45 PM  Case Manager:  Roque Lias LCSW 09/12/2015 3:45 PM  Counselor:  Matthew Saras, MSW Intern 09/12/2015 3:45 PM  Other:   09/12/2015 3:45 PM  Other:   09/12/2015 3:45 PM  Other:   09/12/2015 3:45 PM  Other:  09/12/2015 3:45 PM  Other:    Other:    Other:    Other:    Other:    Other:      Scribe for Treatment Team:   Georga Kaufmann, MSW Intern 09/12/2015 3:45  PM   

## 2015-09-12 NOTE — Progress Notes (Signed)
D: Pt continues to speak of the presence of "[place]". Pt is passive SI with no plans to harm herself. "[place] has no plans". Pt observed rubbing her hands repeatedly across her bed sheets during this interaction. Pt had voiced past plans of using a sheet as a suicide attempt to staff. Pt discussed how she slept all morning due to her qhs medications on 09/10/15. Pt is aware that adjustments were made to her mediations. Pt was able to verbalize her medications to Probation officer. Pt is negative for any HI. Pt is visible within the milieu but with no interactions with the other patients. Pt presents guarded and suspicious in behavior.  A: Writer administered scheduled medications to pt, per MD orders. Continued support and availability as needed was extended to this pt. Staff continue to monitor pt with q70min checks.  R: No adverse drug reactions noted. Pt receptive to treatment. Pt remains safe at this time.

## 2015-09-12 NOTE — Progress Notes (Signed)
RN 1:1  0600 D: Pt in bed resting with eyes closed. Respirations even and unlabored. Pt appears to be in no signs of distress at this time. A: 1:1 observation remains for pt's safety.  R: Pt remains safe at this time.

## 2015-09-12 NOTE — Plan of Care (Signed)
Problem: Diagnosis: Increased Risk For Suicide Attempt Goal: LTG-Patient Will Report Improved Mood and Deny Suicidal LTG (by discharge) Patient will report improved mood and deny suicidal ideation.  Outcome: Progressing Pt reports that "[place]" lacks a plan of suicide on the evening of 09/11/15. Pt unable to contract for safety.

## 2015-09-12 NOTE — Plan of Care (Signed)
Problem: Ineffective individual coping Goal: STG: Patient will remain free from self harm Outcome: Progressing No self harm behaviors noted.  She continues to hear "[place]'s" voice and she tells her to harm herself.
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
## 2015-09-12 NOTE — [facility] Group Notes (Signed)
[place] LCSW Group Therapy   09/12/2015 1:48 PM  Type of Therapy: Group Therapy  Participation Level:  Active  Participation Quality:  Attentive  Affect:  Flat  Cognitive:  Oriented  Insight:  Limited  Engagement in Therapy:  Engaged  Modes of Intervention:  Discussion and Socialization  Summary of Progress/Problems: Chaplain was here to lead a group on themes of hope and/or courage.  Pt attended group and remained alert throughout the group. However, pt did not engage and gave no response to questions asked of her. Pt fiddled with her clothes throughout the group and whispered to herself.  [name] [name] 09/12/2015 1:48 PM

## 2015-09-12 NOTE — Progress Notes (Signed)
D: Pt was in the dayroom with the sitter prior to the assessment. Pt wasn't interacting with sitter or other peers. When asked if she was experiencing auditory hallucinations, pt stated, "no not tonight". Pt was very polite saying "thank you" several times during the assessment. Pt denied SI.  A:  Support and encouragement was offered. 1:1  continued for safety.  R: Pt remains safe.

## 2015-09-12 NOTE — BHH Suicide Risk Assessment (Signed)
[facility] INPATIENT:  Family/Significant Other Suicide Prevention Education  Suicide Prevention Education:  Education Completed: Gearldine Shown (mother) [phone]  has been identified by the patient as the family member/significant other with whom the patient will be residing, and identified as the person(s) who will aid the patient in the event of a mental health crisis (suicidal ideations/suicide attempt).  With written consent from the patient, the family member/significant other has been provided the following suicide prevention education, prior to the and/or following the discharge of the patient.  The suicide prevention education provided includes the following:  Suicide risk factors  Suicide prevention and interventions  National Suicide Hotline telephone number  [facility] assessment telephone number  [facility] Emergency Assistance [place] and/or Residential Mobile Crisis Unit telephone number  Request made of family/significant other to:  Remove weapons (e.g., guns, rifles, knives), all items previously/currently identified as safety concern.    Remove drugs/medications (over-the-counter, prescriptions, illicit drugs), all items previously/currently identified as a safety concern.  The family member/significant other verbalizes understanding of the suicide prevention education information provided.  The family member/significant other agrees to remove the items of safety concern listed above.  Georga Kaufmann 09/12/2015, 3:07 PM

## 2015-09-12 NOTE — Progress Notes (Signed)
DAR 1:1 Note 1000 [name] [name] has been up and down on the unit.  She continues to mumble to herself and talks about the lemurs can't visit here.  She reported that she is slept well last night.  She has taken a shower and ate a small portion of her breakfast.  She denies any suicidal thoughts but states that [place] hasn't told her yet how to harm herself yet today.  She C/O anxiety and prn of Ativan given with good relief.  Sitter remains by her side.  She denies any pain today.  She appears to be in no physical distress.  Q 15 minute checks maintained and 1:1 will continue for safety.  We will continue to monitor the progress towards her goals.

## 2015-09-12 NOTE — Progress Notes (Signed)
1:1 DAR Note 1400 [place] continues on 1:1 with sitter by her side.  Xylah states that [place] has started talking with her this afternoon and "she hasn't made any plans for me to hurt myself yet."  She has attended groups.  She has been pacing up and down the hall.  She is able to talk with staff more and ask questions as to why she is taking the medications she is prescribed but has taken her medications without difficulty.   She is guarded and only interacts with female staff.  No interaction with peers. 15 minute checks maintained for safety.  1:1 will continue for safety per MD order.  We will continue to monitor the progress towards her goals.

## 2015-09-12 NOTE — Progress Notes (Signed)
RN 1:1 Note 0200 D: Pt in bed resting with eyes closed. Respirations even and unlabored. Pt appears to be in no signs of distress at this time. A: 1:1 observation remains for pt's safety.  R: Pt remains safe at this time.
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
## 2015-09-12 NOTE — Progress Notes (Signed)
MEDICATION RELATED CONSULT NOTE - FOLLOW UP   Pharmacy: Clozapine    Labs:  Recent Labs  09/10/15 1920  WBC 9.8  HGB 13.5  HCT 41.3  PLT 331   Dif:  6.5    Medications:  Scheduled:  . ARIPiprazole  5 mg Oral QPM  . benztropine  1 mg Oral QPM  . cloZAPine  50 mg Oral QHS  . FLUoxetine  40 mg Oral Daily  . metFORMIN  500 mg Oral Q breakfast  . paliperidone  156 mg Intramuscular Q28 days  . pantoprazole  40 mg Oral Daily  . simvastatin  20 mg Oral QHS    Assessment: Patient registered with clozapine registry and eligible to receive clozapine.  Labs appropriate for clozapine dispensing   Plan:  Continue to restart clozapine  Lenox Ponds 09/12/2015,10:46 AM

## 2015-09-12 NOTE — B[facility] Group Notes (Signed)
[facility] LCSW Aftercare Discharge Planning Group Note   09/12/2015 10:46 AM  Participation Quality:  Active  Mood/Affect:  Flat  Depression Rating:  5/10  Anxiety Rating:  7/10  Thoughts of Suicide:  No Will you contract for safety?   NA  Current AVH:  Yes  Plan for Discharge/Comments:  Pt was distant and bizarre in group. Pt's comments were hard to follow and pt spoke a lot about hearing "[place]". When asked if pt ate breakfast this morning pt stated "[place] let me have 3 bites of a biscuit". Pt also mentioned that she was visited by her ACT team and this helped her feel a little better.   Transportation Means: ACT team    Supports: Family and Act team  Georga Kaufmann

## 2015-09-13 DIAGNOSIS — F250 Schizoaffective disorder, bipolar type: Principal | ICD-10-CM

## 2015-09-13 NOTE — Progress Notes (Signed)
1:1 Nursing note- Writer observed patient earlier at shift change up in the dayroom watching tv and no interaction with peers. She returned to her room after group and was lying down when writer spoke with her 1:1. She reports that she has si and is unable to contract for safety. She reports voices occasionally from her " friend" today. She denies hi/visual hallucinations. She refused her clozaril this evening reporting that she thought the dosage amount was supposed to be 25 mg instead of 50 mg. Writer encouraged her to talk with her doctor on tomorrow to resolve this issue. She reported that she was medicated earlier today and is now feeling sleepy. Writer returned to her room at 2200 and she was lying in bed asleep with no distress noted. 1:1 continues and patient remains safe with sitter by bedside.
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
## 2015-09-13 NOTE — Progress Notes (Signed)
D:Pt laying in bed resting with eyes closed. Respirations even and unlabored. No distress noted. A: Continue 1:1 for safety. R: Pt remains safe.
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
## 2015-09-13 NOTE — B[facility] Group Notes (Signed)
[facility] Group Notes:  (Clinical Social Work)  09/13/2015  11:15-12:00PM  Summary of Progress/Problems:   The main focus of today's process group was to discuss patients' feelings related to being hospitalized, as well as the difference between "being" and "having" a mental health diagnosis.  It was agreed in general by the group that it would be preferable to avoid future hospitalizations, and we discussed means of doing that.  As a follow-up, problems with adhering to medication recommendations were discussed.  The patient would not speak during group, was in and out several times with her 1:1 MHT.  Type of Therapy:  Group Therapy - Process  Participation Level:  None  Participation Quality:  Attentive and Inattentive  Affect:  Flat  Cognitive:  Disorganized  Insight:  None  Engagement in Therapy:  None  Modes of Intervention:  Exploration, Discussion  Selmer Dominion, LCSW 09/13/2015, 1:07 PM

## 2015-09-13 NOTE — Progress Notes (Signed)
Patient ID: [name] [name], female   DOB: 1971-10-04, 44 y.o.   MRN: [id number]   NURSING 1:1 NOTE  D: Pt in bed resting, no physical distress noted. Pt will respond to the calling of her name, no issues noted. Pt reported no issues or concerns at this time. A: 1:1 continued for patient safety. R. Pts safety maintained.

## 2015-09-13 NOTE — Progress Notes (Signed)
[facility] Group Notes:  (Nursing/MHT/Case Management/Adjunct)  Date:  09/13/2015  Time:  9:08 PM  Type of Therapy:  Psychoeducational Skills  Participation Level:  Minimal  Participation Quality:  Inattentive  Affect:  Flat  Cognitive:  Appropriate  Insight:  Limited  Engagement in Group:  Resistant  Modes of Intervention:  Education  Summary of Progress/Problems: The patient had little to share with the group except to say that she was visited by her mother and sister this evening. She would not provide any additional details about her day. Her coping skill (theme for the day) is to "get up and walk".     [name] 09/13/2015, 9:08 PM

## 2015-09-14 NOTE — Progress Notes (Cosign Needed)
D) Pt attended "positive supports" group with 1:1 at pt side. Pt appears to be responding to internal stimuli throughout group.  [name] [name] did not participate even with prompting. Eye contact poor. Speech very soft. Thoughts are disorganized. At times pt tries to pick something invisible off of Probation officer. Pt says "[place]" is talking to her currently. A) Level 1 obs for safety supported. Support, encouragement, and reassurance provided. Safety maintained.

## 2015-09-14 NOTE — Progress Notes (Cosign Needed)
D) Pt has been sitting in dayroom with sitter at side, watching t.v. Pt delusional, disorganized. No apparent physical distress. A) Level 3  obs for safety, support and encouragement provided. Med ed reinforced. R) Safety maintained.

## 2015-09-14 NOTE — Plan of Care (Signed)
Problem: Ineffective individual coping Goal: LTG: Patient will report a decrease in negative feelings Outcome: Progressing Patient is unable to contract for safety and continues to have suicidal thoughts to harm self.

## 2015-09-14 NOTE — B[facility] Group Notes (Signed)
[facility] Group Notes:  (Clinical Social Work)  09/14/2015  [facility] Group Notes:  (Clinical Social Work)  09/14/2015  11:00AM-12:00PM  Summary of Progress/Problems:  The main focus of today's process group was to listen to a variety of genres of music and to identify that different types of music provoke different responses.  The patient then was able to identify personally what was soothing for them, as well as energizing.  Handouts were used to record feelings evoked, as well as how patient can personally use this knowledge in sleep habits, with depression, and with other symptoms.  The patient expressed nothing during group, even when called on directly.  She remained mute and demonstrated no outward reaction to any of the music.  Type of Therapy:  Music Therapy   Participation Level:  Minimal  Participation Quality:  Resistant  Affect:  Flat  Cognitive:  N/A  Insight:  None  Engagement in Therapy:  None  Modes of Intervention:   Activity, Exploration  Selmer Dominion, LCSW 09/14/2015

## 2015-09-14 NOTE — Progress Notes (Signed)
1:1 Nursing Note (late entry) Patient is currently lying on her right side asleep with eyes closed and respirations even and unlabored. Patient remains safe with sitter at bedside. 1:1 continues.

## 2015-09-14 NOTE — Progress Notes (Signed)
1:1 Nursing note- (Late entry) Patient is currently lying in bed asleep with eyes closed and respirations even and unlabored. No distress noted. 1:1 continues and patient is safe with sitter at bedside

## 2015-09-14 NOTE — Progress Notes (Cosign Needed)
D) Pt has been sitting quietly in the dayroom with sitter at her side. Pt appears to be responding to internal stimuli and having disorganized thoughts. Eye contact poor. Mood guarded and hypervigilant. A) Level 1 obs continued for safety. Support and reassurance provided. Prompting and redirections as needed. R) Safety maintained.

## 2015-09-14 NOTE — [facility] Group Notes (Signed)
[facility] Group Notes:  (Nursing/MHT/Case Management/Adjunct)  Date:  09/14/2015  Time:  2:52 PM  Type of Therapy:  Psychoeducational Skills  Participation Level:  None  Participation Quality:  Inattentive  Affect:  Not Congruent  Cognitive:  Hallucinating  Insight:  Limited  Engagement in Group:  None  Modes of Intervention:  Discussion and Education  Summary of Progress/Problems: Pt was present but not active.   Alison Murray 09/14/2015, 2:52 PM

## 2015-09-14 NOTE — Progress Notes (Signed)
The patient attended Wrap-Up group this evening but refused to share, .ie. "I decline".
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
## 2015-09-15 NOTE — Progress Notes (Signed)
RN 1:1 Note D: Pt observed walking in the hallway with sitter. Pt appeared to be in no signs of distress at this time. A: 1:1 for pt's safety continues. R: Pt remains safe at this time.

## 2015-09-15 NOTE — Plan of Care (Signed)
Problem: Diagnosis: Increased Risk For Suicide Attempt Goal: LTG-Patient Will Show Positive Response to Medication LTG (by discharge) : Patient will show positive response to medication and will participate in the development of the discharge plan.  Outcome: Progressing [name] [name] continues to report that "[place]" continues to talk to her and is working on a plan to try and harm herself.

## 2015-09-15 NOTE — Progress Notes (Signed)
D:Pt laying in bed resting with eyes closed. Respirations even and unlabored. No distress noted. A: Continue to monitor 1:1for safety. R: Pt remains safe.   

## 2015-09-15 NOTE — Progress Notes (Signed)
DAR 1:1 Note 1400 [name] [name] has been out on the unit, sitting in the day room.  1:1 sitter remains at side.  [name] reports that her goal for today is to "keep [place] calm" and how she is going to accomplish this is by taking her medication.  She reported from her self inventory that her depression is 7/10, hopelessness 8/10 and her anxiety is 9/10.  She continues to be very guarded around males on the unit.  Staff continue to reassure her that she is safe and she will remain a 1:1 while ont he unit.  She c/o of not having a bowel movement in 3 days.  Milk of Magnesia given prn.  We will monitor the progress.  1:1 continued for safety.  Q 15 minute checks maintained.

## 2015-09-15 NOTE — Progress Notes (Signed)
RN 1:1 Note 2100 D: Pt in the dayroom watching tv with sitter to the left of pt. Pt appeared to be in no signs of distress at this time.  A: 1:1 observation for pt remains for pt's safety.  R: Pt remains safe at this time.
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
## 2015-09-15 NOTE — [facility] Group Notes (Signed)
[facility] LCSW Group Therapy  09/15/2015 1:15 pm  Type of Therapy: Process Group Therapy  Participation Level:  Active  Participation Quality:  Appropriate  Affect:  Flat  Cognitive:  Oriented  Insight:  Improving  Engagement in Group:  Limited  Engagement in Therapy:  Limited  Modes of Intervention:  Activity, Clarification, Education, Problem-solving and Support  Summary of Progress/Problems: Today's group addressed the issue of overcoming obstacles.  Patients were asked to identify their biggest obstacle post d/c that stands in the way of their on-going success, and then problem solve as to how to manage this.  Moving in and out of group multiple times.  Declined to participate when invited.  [name] 09/15/2015   4:05 PM

## 2015-09-15 NOTE — Progress Notes (Signed)
DAR 1:1 Note 1000 [name] [name] has been visible on the unit. 1:1 sitter by her side.  She continues to report that she is hearing "[place]" talk to her and telling her that she is coming up with a plan to hurt self.  [name] [name] states that she will try to hurt herself if [place] tells her what to do.  She attended morning group.  1:1 continued for safety.  Q 15 minute checks maintained.

## 2015-09-15 NOTE — Progress Notes (Signed)
Patient ID: [name] [name], female   DOB: 27-Jan-1971, 44 y.o.   MRN: [id number] [facility] MD Progress Note  09/15/2015 5:50 PM [name] [name]  MRN:  [id number] Subjective:  Patient reports that she feels a little better and states " am I allowed to go eat on a table ?" She denies medication side effects .  Objective; Case discussed with staff and chart reviewed . Patient on one to one for safety. She presented as quite guarded and fearful upon my approach, in spite of reassurance from staff that I was covering MD and seeing my badge . She felt more comfortable meeting in the hallway. Initially silent but gradually became more communicative . Spoke about the hallucinations she has been experiencing - describes them as a friend who " tells me what to do and what not to do". States that she hears her " all the time", but that she the voice - described as name " [place]"- is less harsh and " has not been attacking me as much , she is more mellow ". Of note, does not appear internally preoccupied at the present time. At this time does not endorse active SI.  No disruptive or grossly agitated behaviors on unit- paces at times .  At present not endorsing medication side effects.      Principal Problem: Schizoaffective disorder, bipolar type Diagnosis:   Patient Active Problem List   Diagnosis Date Noted  . 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 Schizoaffective disorder, bipolar type [F25.0] 09/10/2015  . Hx of borderline personality disorder [Z86.59] 09/10/2015  . PTSD (post-traumatic stress disorder) [F43.10] 09/10/2015  . Suicide attempt [T14.91] 09/05/2015  . Overdose, drug [T50.901A] 09/05/2015  . Overdose [T50.901A] 09/05/2015  . Drug overdose [T50.901A]   . QT prolongation [I45.81]   . Weight gain [R63.5] 03/06/2015  . Flushing [R23.2] 03/06/2015  . Iron deficiency anemia, unspecified [D50.9] 11/15/2013  . Liver mass, left lobe [R16.0] 05/28/2013  . Postop check [Z09] 03/13/2013  .  Postoperative wound infection [T81.4XXA] 02/23/2013  . History of colostomy [Z87.19] 02/16/2013  . Borderline personality disorder [F60.3] 08/16/2012    Class: Chronic  . Acute blood loss anemia [D62] 08/01/2012  . Depression, major [F32.2] 08/01/2012  . Renal calculus, left [N20.0] 03/31/2012  . Hydronephrosis, left [N13.30] 03/31/2012   Total Time spent with patient: 20 minutes   Past Medical History:  Past Medical History  Diagnosis Date  . Heart murmur     asa child   . Anemia     hx of   . Depression   . Anxiety   . Anemia   . Chronic kidney disease     kidney stone   . Chronic kidney disease   . Renal insufficiency   . Blood dyscrasia   . Iron deficiency anemia, unspecified 11/15/2013  . Iron deficiency anemia, unspecified 11/15/2013    Past Surgical History  Procedure Laterality Date  . Other surgical history      cyst removed from ovary ? side   . Nephrolithotomy  03/31/2012    Procedure: NEPHROLITHOTOMY PERCUTANEOUS;  Surgeon: Claybon Jabs, MD;  Location: [facility];  Service: Urology;  Laterality: Left;  . Tubal ligation    . Laparotomy  07/31/2012    Procedure: EXPLORATORY LAPAROTOMY;  Surgeon: Harl Bowie, MD;  Location: [facility];  Service: General;  Laterality: N/A;  REPAIR OF PANCREATIC INJURY, EXPLORATION OF RETROPERITONEUM.  [name] Colostomy  07/31/2012    Procedure: COLOSTOMY;  Surgeon: Harl Bowie, MD;  Location: [facility];  Service: General;  Laterality: Right;  . Colostomy reversal    . [facility]  2013    ABDOMINAL SURGERY  . Colostomy closure N/A 02/15/2013    Procedure: COLOSTOMY CLOSURE;  Surgeon: Gwenyth Ober, MD;  Location: [facility];  Service: General;  Laterality: N/A;  Reversal of colostomy  . Dilatation & curettage/hysteroscopy with trueclear N/A 10/24/2013    Procedure: DILATATION & CURETTAGE/HYSTEROSCOPY WITH TRUECLEAR, CERVICAL BLOCK;  Surgeon: Marylynn Pearson, MD;  Location: [facility];  Service: Gynecology;  Laterality: N/A;  . Breast reduction  06/2014    Family History:  Family History  Problem Relation Age of Onset  . Cancer Mother     kidney  . Depression Mother   . Other Neg Hx     adrenal problem   Social History:  History  Alcohol Use No     History  Drug Use No    Social History   Social History  . Marital Status: Legally Separated    Spouse Name: N/A  . Number of Children: 2  . Years of Education: N/A   Occupational History  . Disabled    Social History Main Topics  . Smoking status: Never Smoker   . Smokeless tobacco: Never Used  . Alcohol Use: No  . Drug Use: No  . Sexual Activity: Yes    Birth Control/ Protection: Surgical   Other Topics Concern  . None   Social History Narrative   ** Merged History Encounter **       Additional History:    Sleep: improving   Appetite:  Fair    Musculoskeletal: Strength & Muscle Tone: within normal limits Gait & Station: normal Patient leans: N/A   Psychiatric Specialty Exam: Physical Exam  Review of Systems  Constitutional: Negative for fever and chills.  Skin: Negative for rash.  Psychiatric/Behavioral: Positive for depression and hallucinations.  All other systems reviewed and are negative.   Blood pressure 130/90, pulse 76, temperature 97.7 F (36.5 C), temperature source Oral, resp. rate 18, height 5' 5.75" (1.67 m), weight 209 lb (94.802 kg), SpO2 98 %.Body mass index is 33.99 kg/(m^2).  General Appearance: Fairly Groomed  Engineer, water::  Fair  Speech:  Clear and Coherent  Volume:  Decreased  Mood:   Anxious, but states mood " better"   Affect:  Anxious , apprehensive   Thought Process:  Becomes disorganized at times   Orientation:  Fully alert and attentive   Thought Content:  Delusions, Hallucinations: Auditory Command:  [place] tells her what to do and Rumination  Suicidal Thoughts:   today denies active SI, states that voice she hears tells her what to do , does not feel she is in good control, as she is told what to do   Homicidal  Thoughts:  No  Memory:  Immediate;   Fair Recent;   Fair Remote;   Poor  Judgement:  Impaired  Insight:  Shallow  Psychomotor Activity:   Improved , less restless, paces at times   Concentration:  Poor  Recall:  Poor  Fund of Knowledge:Poor  Language: Fair  Akathisia:  No  Handed:  Right  AIMS (if indicated):     Assets:  Social Support  ADL's:  Intact  Cognition: WNL  Sleep:  Number of Hours: 5.75     Current Medications: Current Facility-Administered Medications  Medication Dose Route Frequency Provider Last Rate Last Dose  . acetaminophen (TYLENOL) tablet 650 mg  650 mg Oral Q6H PRN Frederico Hamman  E Simon, PA-C   650 mg at 09/15/15 1406  . alum & mag hydroxide-simeth (MAALOX/MYLANTA) 200-200-20 MG/5ML suspension 30 mL  30 mL Oral Q4H PRN Laverle Hobby, PA-C      . ARIPiprazole (ABILIFY) tablet 5 mg  5 mg Oral QPM Ursula Alert, MD   5 mg at 09/15/15 1720  . benztropine (COGENTIN) tablet 1 mg  1 mg Oral QPM Ursula Alert, MD   1 mg at 09/15/15 1720  . cloZAPine (CLOZARIL) tablet 50 mg  50 mg Oral QHS Ursula Alert, MD   50 mg at 09/14/15 2239  . FLUoxetine (PROZAC) capsule 40 mg  40 mg Oral Daily Laverle Hobby, PA-C   40 mg at 09/15/15 0806  . hydrOXYzine (ATARAX/VISTARIL) tablet 25 mg  25 mg Oral Q6H PRN Ursula Alert, MD   25 mg at 09/13/15 0933  . loperamide (IMODIUM) capsule 2 mg  2 mg Oral PRN Shuvon B Rankin, NP   2 mg at 09/10/15 1646  . LORazepam (ATIVAN) tablet 1 mg  1 mg Oral Q6H PRN Ursula Alert, MD   1 mg at 09/13/15 1819  . magnesium hydroxide (MILK OF MAGNESIA) suspension 30 mL  30 mL Oral Daily PRN Laverle Hobby, PA-C   30 mL at 09/15/15 1122  . menthol-cetylpyridinium (CEPACOL) lozenge 3 mg  1 lozenge Oral PRN Ursula Alert, MD      . metFORMIN (GLUCOPHAGE) tablet 500 mg  500 mg Oral Q breakfast Laverle Hobby, PA-C   500 mg at 09/15/15 0806  . risperiDONE (RISPERDAL) tablet 2 mg  2 mg Oral TID PRN Ursula Alert, MD   2 mg at 09/13/15 1819   Or  .  OLANZapine (ZYPREXA) injection 5 mg  5 mg Intramuscular TID PRN Ursula Alert, MD      . paliperidone (INVEGA SUSTENNA) injection 156 mg  156 mg Intramuscular Q28 days Ursula Alert, MD   156 mg at 09/10/15 1541  . pantoprazole (PROTONIX) EC tablet 40 mg  40 mg Oral Daily Laverle Hobby, PA-C   40 mg at 09/15/15 0806  . propranolol (INDERAL) tablet 10 mg  10 mg Oral BID Ursula Alert, MD   10 mg at 09/15/15 1720  . simvastatin (ZOCOR) tablet 20 mg  20 mg Oral QHS Laverle Hobby, PA-C   20 mg at 09/14/15 2239  . traZODone (DESYREL) tablet 50 mg  50 mg Oral QHS PRN Ursula Alert, MD        Lab Results:  No results found for this or any previous visit (from the past 48 hour(s)).  Physical Findings: AIMS: Facial and Oral Movements Muscles of Facial Expression: None, normal Lips and Perioral Area: None, normal Jaw: None, normal Tongue: None, normal,Extremity Movements Upper (arms, wrists, hands, fingers): None, normal Lower (legs, knees, ankles, toes): None, normal, Trunk Movements Neck, shoulders, hips: None, normal, Overall Severity Severity of abnormal movements (highest score from questions above): None, normal Incapacitation due to abnormal movements: None, normal Patient's awareness of abnormal movements (rate only patient's report): No Awareness, Dental Status Current problems with teeth and/or dentures?: No Does patient usually wear dentures?: No  CIWA:    COWS:       Assessment - patient remains anxious, guarded, apprehensive, reporting chronic auditory hallucinations of command type, although stating that voice has become less harsh and less critical recently. She is denying medication side effects. She is on one to one due to chronic suicidal ideations and multiple suicide attempts, to include trying  to strangle herself in hospital , as per report .    Treatment Plan Summary: Daily contact with patient to assess and evaluate symptoms and progress in treatment and  Medication management  Pt received  Invega sustenna 156 mg IM - 09/10/15- repeat q28 days( for psychosis )  .  Clozaril to 50 mg po qhs for  Antipsychotic augmentation  CBC with diff to be repeated weekly. ( as on Clozaril )  Continue Cogentin 1 mg po po daily for EPS. Will continue Prozac 40 mg po daily for PTSD .  Trazodone to 50 mg po qhs prn for sleep.  Will continue 1:1 precaution for safety / pt with multiple suicide attempts as well as recent suicidal gestures while on the medical floor. Will continue to monitor vitals ,medication compliance and treatment side effects while patient is here.  Will monitor for medical issues as well as call consult as needed.  CSW will start working on disposition.  Encourage patient to increase her participation in therapeutic milieu  To work on Radiographer, therapeutic, enhance mood .    Medical Decision Making:  Review of Psycho-Social Stressors (1), Review or order clinical lab tests (1), Review of Last Therapy Session (1), Review of Medication Regimen & Side Effects (2) and Review of New Medication or Change in Dosage (2)     Neita Garnet , MD   09/15/2015, 5:50 PM

## 2015-09-15 NOTE — Progress Notes (Signed)
1:1 DAR Note 1800 [name] [name] has been in the day room much of the day.  She does get up and move to her room if the males on the unit come in the day room.  She continues to report that "[place]" continues to talk to her but hasn't given her ideas on how to harm herself.  She interacts with female staff.  She denies any physical complaints.  She appears to be in no physical distress.  1:1 sitter by her side.  Encouraged her to participate in group and unit activities.  She is asking about going to the cafeteria because "I want to eat at a table."  Informed her that she will need to speak to the doctor to get an order to leave the unit.  Q 15 minute checks maintained for safety.  1:1 continued for her safety.  We will continue to monitor the progress towards her goals.

## 2015-09-16 LAB — CBC WITH DIFFERENTIAL/PLATELET
BASOS ABS: 0.1 10*3/uL (ref 0.0–0.1)
Basophils Relative: 1 %
EOS ABS: 0 10*3/uL (ref 0.0–0.7)
EOS PCT: 0 %
HCT: 42.5 % (ref 36.0–46.0)
Hemoglobin: 13.6 g/dL (ref 12.0–15.0)
LYMPHS ABS: 2.4 10*3/uL (ref 0.7–4.0)
LYMPHS PCT: 31 %
MCH: 28.1 pg (ref 26.0–34.0)
MCHC: 32 g/dL (ref 30.0–36.0)
MCV: 87.8 fL (ref 78.0–100.0)
MONO ABS: 0.7 10*3/uL (ref 0.1–1.0)
Monocytes Relative: 9 %
Neutro Abs: 4.7 10*3/uL (ref 1.7–7.7)
Neutrophils Relative %: 59 %
PLATELETS: 330 10*3/uL (ref 150–400)
RBC: 4.84 MIL/uL (ref 3.87–5.11)
RDW: 13.4 % (ref 11.5–15.5)
WBC: 7.9 10*3/uL (ref 4.0–10.5)

## 2015-09-16 NOTE — Progress Notes (Signed)
[name] [name] was able to go to lunch in [employer].  She is currently attending afternoon pharmacist group.  She continues to report hearing "[place]" talk today.  She completed her self inventory and reports her depression is a 7/10, hopelessness 9/10 and anxiety is 7/10.  She was able to accomplish her goal today of being able to go to the cafeteria and eat at a table.  She denies thoughts of wanting to hurt herself today although [place] continues to talk to her.  She denies any pain and she appears to be in no physical distress.  [name] sitter remains at her side.  [name] will continue for safety.  Clarisse remains safe on the unit.

## 2015-09-16 NOTE — Progress Notes (Signed)
1:1 DAR Note 1000 [name] [name] did not attend morning group.  She continues to report that she hears [place] talk but she hasn't been told how to harm herself today.  She isn't able to contract for safety.  She took her medications without difficulty.  1:1 sitter by her side. 1:1 continued for safety. Q 15 minute checks maintained. No physical complaints voiced.  She appears to be in no physical distress.

## 2015-09-16 NOTE — [facility] Group Notes (Signed)
[facility] LCSW Group Therapy  09/16/2015 1:15 pm  Type of Therapy: Process Group Therapy  Participation Level:  Active  Participation Quality:  Appropriate  Affect:  Flat  Cognitive:  Oriented  Insight:  Improving  Engagement in Group:  Limited  Engagement in Therapy:  Limited  Modes of Intervention:  Activity, Clarification, Education, Problem-solving and Support  Summary of Progress/Problems: Today's group addressed the issue of overcoming obstacles.  Patients were asked to identify their biggest obstacle post d/c that stands in the way of their on-going success, and then problem solve as to how to manage this.  Stayed the entire time.  Did not contribute to the discussion.  When invited, stated she "passed."  [name] [name] 09/16/2015   3:43 PM

## 2015-09-16 NOTE — Progress Notes (Signed)
Patient ID: [name] [name], female   DOB: 05/17/1971, 44 y.o.   MRN: [id number] D: Client visible on unit, spent most of the evening in dayroom, watching TV until bedtime. Client commented on some of the participants in this singing program. Client reports "been a good day, able to eat lunch and dinner with everybody else" "I'm good, [place]'s quieter now" "a little drooling at night with Clozaril, but it's manageable" A: Writer provides emotional support, commend client for taking a part in the milieu. Staff will monitor q43min for safety. R: Client is safe on the unit, attended group.

## 2015-09-16 NOTE — Progress Notes (Signed)
Patient ID: [name] [name], female   DOB: 1971-02-22, 44 y.o.   MRN: [id number] [facility] MD Progress Note  09/16/2015 6:19 PM [name] [name]  MRN:  [id number] Subjective:   She continues to report feeling better , and her focus is to be able to go to cafeteria for meal with rest of patients. She denies medication side effects. She reports ongoing auditory and visual hallucinations, as below . She does state, however, that " [place]" is becoming more distant, and less critical, and is not telling her to hurt herself. States " even if she tells me bad things , when I am OK I don't do them".   .  Objective; Case discussed with staff and chart reviewed .  I have reviewed one to one obs level with staff- patient has a history of multiple self injurious behaviors in the past, to include suicidal behaviors on inpatient unit, therefore, staff feels it is indicated to continue one to one at this time, in spite of patient's overall improvement compared to admission.  Ongoing auditory and visual hallucinations- states she hears the voice of " [place]" and sometimes sees her as well, states " she is a red haired woman ". [place] is often hypercritical and demanding, but patient states that as she is feeling better she has become " more mellow and more distant ". Patient remains anxious, somewhat hypervigilant, but in general more relaxed and less severely anxious than upon admission.  At this time does not endorse active SI.  No disruptive or grossly agitated behaviors on unit. She feels medications are well tolerated and denies side effects at present .  Labs - WBC 5.9, ANC 4.7     Principal Problem: Schizoaffective disorder, bipolar type Diagnosis:   Patient Active Problem List   Diagnosis Date Noted  . 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 Schizoaffective disorder, bipolar type [F25.0] 09/10/2015  . Hx of borderline personality disorder [Z86.59] 09/10/2015  . PTSD (post-traumatic stress disorder)  [F43.10] 09/10/2015  . Suicide attempt [T14.91] 09/05/2015  . Overdose, drug [T50.901A] 09/05/2015  . Overdose [T50.901A] 09/05/2015  . Drug overdose [T50.901A]   . QT prolongation [I45.81]   . Weight gain [R63.5] 03/06/2015  . Flushing [R23.2] 03/06/2015  . Iron deficiency anemia, unspecified [D50.9] 11/15/2013  . Liver mass, left lobe [R16.0] 05/28/2013  . Postop check [Z09] 03/13/2013  . Postoperative wound infection [T81.4XXA] 02/23/2013  . History of colostomy [Z87.19] 02/16/2013  . Borderline personality disorder [F60.3] 08/16/2012    Class: Chronic  . Acute blood loss anemia [D62] 08/01/2012  . Depression, major [F32.2] 08/01/2012  . Renal calculus, left [N20.0] 03/31/2012  . Hydronephrosis, left [N13.30] 03/31/2012   Total Time spent with patient: 20 minutes   Past Medical History:  Past Medical History  Diagnosis Date  . Heart murmur     asa child   . Anemia     hx of   . Depression   . Anxiety   . Anemia   . Chronic kidney disease     kidney stone   . Chronic kidney disease   . Renal insufficiency   . Blood dyscrasia   . Iron deficiency anemia, unspecified 11/15/2013  . Iron deficiency anemia, unspecified 11/15/2013    Past Surgical History  Procedure Laterality Date  . Other surgical history      cyst removed from ovary ? side   . Nephrolithotomy  03/31/2012    Procedure: NEPHROLITHOTOMY PERCUTANEOUS;  Surgeon: Claybon Jabs, MD;  Location: [facility];  Service: Urology;  Laterality: Left;  . Tubal ligation    . Laparotomy  07/31/2012    Procedure: EXPLORATORY LAPAROTOMY;  Surgeon: Harl Bowie, MD;  Location: [place];  Service: General;  Laterality: N/A;  REPAIR OF PANCREATIC INJURY, EXPLORATION OF RETROPERITONEUM.  [name] Colostomy  07/31/2012    Procedure: COLOSTOMY;  Surgeon: Harl Bowie, MD;  Location: [place];  Service: General;  Laterality: Right;  . Colostomy reversal    . Gws  2013    ABDOMINAL SURGERY  . Colostomy closure N/A 02/15/2013     Procedure: COLOSTOMY CLOSURE;  Surgeon: Gwenyth Ober, MD;  Location: [place];  Service: General;  Laterality: N/A;  Reversal of colostomy  . Dilatation & curettage/hysteroscopy with trueclear N/A 10/24/2013    Procedure: DILATATION & CURETTAGE/HYSTEROSCOPY WITH TRUECLEAR, CERVICAL BLOCK;  Surgeon: Marylynn Pearson, MD;  Location: [facility];  Service: Gynecology;  Laterality: N/A;  . Breast reduction  06/2014   Family History:  Family History  Problem Relation Age of Onset  . Cancer Mother     kidney  . Depression Mother   . Other Neg Hx     adrenal problem   Social History:  History  Alcohol Use No     History  Drug Use No    Social History   Social History  . Marital Status: Legally Separated    Spouse Name: N/A  . Number of Children: 2  . Years of Education: N/A   Occupational History  . Disabled    Social History Main Topics  . Smoking status: Never Smoker   . Smokeless tobacco: Never Used  . Alcohol Use: No  . Drug Use: No  . Sexual Activity: Yes    Birth Control/ Protection: Surgical   Other Topics Concern  . None   Social History Narrative   ** Merged History Encounter **       Additional History:    Sleep: improving   Appetite:  Fair    Musculoskeletal: Strength & Muscle Tone: within normal limits Gait & Station: normal Patient leans: N/A   Psychiatric Specialty Exam: Physical Exam  Review of Systems  Constitutional: Negative for fever and chills.  Skin: Negative for rash.  Psychiatric/Behavioral: Positive for depression and hallucinations.  All other systems reviewed and are negative.   Blood pressure 120/70, pulse 116, temperature 97.7 F (36.5 C), temperature source Oral, resp. rate 18, height 5' 5.75" (1.67 m), weight 209 lb (94.802 kg), SpO2 98 %.Body mass index is 33.99 kg/(m^2).  General Appearance:  Improved grooming   Eye Contact::   Improved eye contact   Speech:  Clear and Coherent  Volume:  Decreased  Mood:   Anxious, but states  mood " better"   Affect:   Less severely anxious, less apprehensive   Thought Process:  Becomes disorganized at times   Orientation:  Fully alert and attentive   Thought Content:  Delusions, Hallucinations: Auditory Command:  [place] tells her what to do and Rumination  Suicidal Thoughts:   today denies active SI,  Denies command hallucinations at this time.   Homicidal Thoughts:  No  Memory:  Immediate;   Fair Recent;   Fair Remote;   Poor  Judgement:  Impaired  Insight:  Shallow  Psychomotor Activity:   Improved , today not presenting with psychomotor agitation  Concentration:  Poor  Recall:  Poor  Fund of Knowledge:Poor  Language: Fair  Akathisia:  No  Handed:  Right  AIMS (if indicated):  Assets:  Social Support  ADL's:   Gradually improving   Cognition: WNL  Sleep:  Number of Hours: 6.75     Current Medications: Current Facility-Administered Medications  Medication Dose Route Frequency Provider Last Rate Last Dose  . acetaminophen (TYLENOL) tablet 650 mg  650 mg Oral Q6H PRN Laverle Hobby, PA-C   650 mg at 09/15/15 1406  . alum & mag hydroxide-simeth (MAALOX/MYLANTA) 200-200-20 MG/5ML suspension 30 mL  30 mL Oral Q4H PRN Laverle Hobby, PA-C      . ARIPiprazole (ABILIFY) tablet 5 mg  5 mg Oral QPM Saramma Eappen, MD   5 mg at 09/16/15 1707  . benztropine (COGENTIN) tablet 1 mg  1 mg Oral QPM Saramma Eappen, MD   1 mg at 09/16/15 1707  . cloZAPine (CLOZARIL) tablet 50 mg  50 mg Oral QHS Ursula Alert, MD   50 mg at 09/15/15 2106  . FLUoxetine (PROZAC) capsule 40 mg  40 mg Oral Daily Laverle Hobby, PA-C   40 mg at 09/16/15 0753  . hydrOXYzine (ATARAX/VISTARIL) tablet 25 mg  25 mg Oral Q6H PRN Ursula Alert, MD   25 mg at 09/13/15 0933  . loperamide (IMODIUM) capsule 2 mg  2 mg Oral PRN Shuvon B Rankin, NP   2 mg at 09/10/15 1646  . LORazepam (ATIVAN) tablet 1 mg  1 mg Oral Q6H PRN Ursula Alert, MD   1 mg at 09/13/15 1819  . magnesium hydroxide (MILK OF MAGNESIA)  suspension 30 mL  30 mL Oral Daily PRN Laverle Hobby, PA-C   30 mL at 09/15/15 1122  . menthol-cetylpyridinium (CEPACOL) lozenge 3 mg  1 lozenge Oral PRN Ursula Alert, MD      . metFORMIN (GLUCOPHAGE) tablet 500 mg  500 mg Oral Q breakfast Laverle Hobby, PA-C   500 mg at 09/16/15 0753  . risperiDONE (RISPERDAL) tablet 2 mg  2 mg Oral TID PRN Ursula Alert, MD   2 mg at 09/13/15 1819   Or  . OLANZapine (ZYPREXA) injection 5 mg  5 mg Intramuscular TID PRN Ursula Alert, MD      . paliperidone (INVEGA SUSTENNA) injection 156 mg  156 mg Intramuscular Q28 days Ursula Alert, MD   156 mg at 09/10/15 1541  . pantoprazole (PROTONIX) EC tablet 40 mg  40 mg Oral Daily Laverle Hobby, PA-C   40 mg at 09/16/15 0753  . propranolol (INDERAL) tablet 10 mg  10 mg Oral BID Ursula Alert, MD   10 mg at 09/16/15 1707  . simvastatin (ZOCOR) tablet 20 mg  20 mg Oral QHS Laverle Hobby, PA-C   20 mg at 09/15/15 2106  . traZODone (DESYREL) tablet 50 mg  50 mg Oral QHS PRN Ursula Alert, MD        Lab Results:  Results for orders placed or performed during the hospital encounter of 09/09/15 (from the past 48 hour(s))  CBC with Differential/Platelet     Status: None   Collection Time: 09/16/15  7:05 AM  Result Value Ref Range   WBC 7.9 4.0 - 10.5 K/uL   RBC 4.84 3.87 - 5.11 MIL/uL   Hemoglobin 13.6 12.0 - 15.0 g/dL   HCT 42.5 36.0 - 46.0 %   MCV 87.8 78.0 - 100.0 fL   MCH 28.1 26.0 - 34.0 pg   MCHC 32.0 30.0 - 36.0 g/dL   RDW 13.4 11.5 - 15.5 %   Platelets 330 150 - 400 K/uL   Neutrophils Relative %  59 %   Neutro Abs 4.7 1.7 - 7.7 K/uL   Lymphocytes Relative 31 %   Lymphs Abs 2.4 0.7 - 4.0 K/uL   Monocytes Relative 9 %   Monocytes Absolute 0.7 0.1 - 1.0 K/uL   Eosinophils Relative 0 %   Eosinophils Absolute 0.0 0.0 - 0.7 K/uL   Basophils Relative 1 %   Basophils Absolute 0.1 0.0 - 0.1 K/uL    Comment: Performed at [facility]    Physical Findings: AIMS: Facial and Oral  Movements Muscles of Facial Expression: None, normal Lips and Perioral Area: None, normal Jaw: None, normal Tongue: None, normal,Extremity Movements Upper (arms, wrists, hands, fingers): None, normal Lower (legs, knees, ankles, toes): None, normal, Trunk Movements Neck, shoulders, hips: None, normal, Overall Severity Severity of abnormal movements (highest score from questions above): None, normal Incapacitation due to abnormal movements: None, normal Patient's awareness of abnormal movements (rate only patient's report): No Awareness, Dental Status Current problems with teeth and/or dentures?: No Does patient usually wear dentures?: No  CIWA:    COWS:       Assessment - patient  Is improving , and presents less guarded, less anxious, and states the intensity of her hallucinations has decreased. Her insight remains limited, and she remains actively psychotic. She is tolerating medications well. Of note WBC, ANC within normal limits ( on clozaril )     Treatment Plan Summary: Daily contact with patient to assess and evaluate symptoms and progress in treatment and Medication management  Pt received  Invega sustenna 156 mg IM - 09/10/15- repeat q28 days( for psychosis )  . Continue Clozaril  50 mg po qhs for  Antipsychotic augmentation  Continue Cogentin 1 mg po po daily for EPS. Continue Prozac 40 mg po daily for PTSD . Continue Trazodone to 50 mg po qhs prn for sleep.  Will continue 1:1 precaution for safety / pt with multiple suicide attempts as well as recent suicidal gestures while on the medical floor. Will continue to monitor vitals ,medication compliance and treatment side effects while patient is here. .  CSW  working on disposition options  Encourage patient to increase / continue her participation in therapeutic milieu  To work on Radiographer, therapeutic, enhance mood .    Medical Decision Making:  Review of Psycho-Social Stressors (1), Review or order clinical lab tests (1),  Review of Last Therapy Session (1), Review of Medication Regimen & Side Effects (2) and Review of New Medication or Change in Dosage (2)     Neita Garnet , MD   09/16/2015, 6:19 PM

## 2015-09-16 NOTE — [facility] Group Notes (Signed)
[facility] Group Notes:  (Nursing/MHT/Case Management/Adjunct)  Date:  09/16/2015  Time:  10:15 AM  Type of Therapy:  Nurse Education  Participation Level:  Did Not Attend  Participation Quality:    Affect:    Cognitive:    Insight:    Engagement in Group:    Modes of Intervention:  Discussion and Education  Summary of Progress/Problems:   Group topic was Recovery.  Discussed coping skills and setting goals.  Encouraged to work on Publishing copy for coping skills to use when feeling bad.  She didn't attend as she was sleeping.  Barbette Or Reddick 09/16/2015, 10:15 AM

## 2015-09-16 NOTE — Progress Notes (Signed)
1:1 DAR Note 1800 [name] [name] continues to remain on 1:1 and sitter remains by side.  [name] [name] states that she isn't having suicidal thoughts but "[place]" isn't telling her how to harm herself.  She has been interacting more with female peers.  She was coloring in the day room.  She went to the cafeteria at supper and stated that she was glad that she was able to "eat at a table and drink coke."  She denies any pain.  She appears to be in no physical distress.  1:1 continued for safety.  q 15 minute checks maintained for safety.  Continued to encouraged her to participate in group/unit activities.  We will continue to monitor the progress towards her goals.

## 2015-09-16 NOTE — Plan of Care (Signed)
Problem: Alteration in thought process Goal: LTG-Patient has not harmed self or others in at least 2 days Outcome: Progressing She remains on 1:1 monitoring.  She hasn't made any attempts to harm self.  We will continue to monitor.

## 2015-09-17 NOTE — Progress Notes (Signed)
1:1 progress note:  Pt is lying in bed at this time with eyes closed.  No distress observed.  Earlier she was in the dayroom watching TV.  She reported that she had a good day.  She said that "[place]" did not bother her today.  She denies SI/HI.  She did seem to still be a little anxious.  She answered questions appropriately.  She continues on 1:1 for safety.  Sitter with pt at this time.  Support and encouragement offered.  Pt is safe at this time.

## 2015-09-17 NOTE — Progress Notes (Signed)
Nursing 1:1 note: Pt is in the room observed lying on bed. Pt denies SI, AH at this time. The staff in the room with for safety, none complains at this time, we will continue to monitor.

## 2015-09-17 NOTE — Progress Notes (Signed)
Adult Psychoeducational Group Note  Date:  09/17/2015 Time:  8:56 PM  Group Topic/Focus:  Wrap-Up Group:   The focus of this group is to help patients review their daily goal of treatment and discuss progress on daily workbooks.  Participation Level:  Active  Participation Quality:  Appropriate  Affect:  Appropriate  Cognitive:  Appropriate  Insight: Appropriate  Engagement in Group:  Engaged  Modes of Intervention:  Discussion  Additional Comments:  The patient expressed that she rates her day a 8.The patient also said that her day went fine.  Nash Shearer 09/17/2015, 8:56 PM

## 2015-09-17 NOTE — Progress Notes (Signed)
[facility] MD Progress Note  09/17/2015 4:27 PM [name] [name]  MRN:  [id number] Subjective: Patient reports significant improvement and today states she has not heard the voice of " [place]" . States this is the first time " in a long time " she is free from hallucinations. Denies medication side effects. Acknowledges her mood is improved, as well .    [name]  Objective; Case discussed with staff and chart reviewed .  Report is that patient is improving. No disruptive behaviors on unit .  More visible in day room.  She is noticeably more relaxed, less guarded upon approach, and is more verbal, also with improved eye contact . Denies medication side effects at present . States she is ready to start working on discharge, disposition plans - she lives with her mother and wants to return home after discharge .     Principal Problem: Schizoaffective disorder, bipolar type Diagnosis:   Patient Active Problem List   Diagnosis Date Noted  . 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 Schizoaffective disorder, bipolar type [F25.0] 09/10/2015  . Hx of borderline personality disorder [Z86.59] 09/10/2015  . PTSD (post-traumatic stress disorder) [F43.10] 09/10/2015  . Suicide attempt [T14.91] 09/05/2015  . Overdose, drug [T50.901A] 09/05/2015  . Overdose [T50.901A] 09/05/2015  . Drug overdose [T50.901A]   . QT prolongation [I45.81]   . Weight gain [R63.5] 03/06/2015  . Flushing [R23.2] 03/06/2015  . Iron deficiency anemia, unspecified [D50.9] 11/15/2013  . Liver mass, left lobe [R16.0] 05/28/2013  . Postop check [Z09] 03/13/2013  . Postoperative wound infection [T81.4XXA] 02/23/2013  . History of colostomy [Z87.19] 02/16/2013  . Borderline personality disorder [F60.3] 08/16/2012    Class: Chronic  . Acute blood loss anemia [D62] 08/01/2012  . Depression, major [F32.2] 08/01/2012  . Renal calculus, left [N20.0] 03/31/2012  . Hydronephrosis, left [N13.30] 03/31/2012   Total Time spent with patient:  20 minutes   Past Medical History:  Past Medical History  Diagnosis Date  . Heart murmur     asa child   . Anemia     hx of   . Depression   . Anxiety   . Anemia   . Chronic kidney disease     kidney stone   . Chronic kidney disease   . Renal insufficiency   . Blood dyscrasia   . Iron deficiency anemia, unspecified 11/15/2013  . Iron deficiency anemia, unspecified 11/15/2013    Past Surgical History  Procedure Laterality Date  . Other surgical history      cyst removed from ovary ? side   . Nephrolithotomy  03/31/2012    Procedure: NEPHROLITHOTOMY PERCUTANEOUS;  Surgeon: Claybon Jabs, MD;  Location: [facility];  Service: Urology;  Laterality: Left;  . Tubal ligation    . Laparotomy  07/31/2012    Procedure: EXPLORATORY LAPAROTOMY;  Surgeon: Harl Bowie, MD;  Location: [place];  Service: General;  Laterality: N/A;  REPAIR OF PANCREATIC INJURY, EXPLORATION OF RETROPERITONEUM.  [name] Colostomy  07/31/2012    Procedure: COLOSTOMY;  Surgeon: Harl Bowie, MD;  Location: [place];  Service: General;  Laterality: Right;  . Colostomy reversal    . Gws  2013    ABDOMINAL SURGERY  . Colostomy closure N/A 02/15/2013    Procedure: COLOSTOMY CLOSURE;  Surgeon: Gwenyth Ober, MD;  Location: [place];  Service: General;  Laterality: N/A;  Reversal of colostomy  . Dilatation & curettage/hysteroscopy with trueclear N/A 10/24/2013    Procedure: DILATATION & CURETTAGE/HYSTEROSCOPY WITH TRUECLEAR, CERVICAL BLOCK;  Surgeon: Marylynn Pearson, MD;  Location: [place] [facility];  Service: Gynecology;  Laterality: N/A;  . Breast reduction  06/2014   Family History:  Family History  Problem Relation Age of Onset  . Cancer Mother     kidney  . Depression Mother   . Other Neg Hx     adrenal problem   Social History:  History  Alcohol Use No     History  Drug Use No    Social History   Social History  . Marital Status: Legally Separated    Spouse Name: N/A  . Number of Children: 2  . Years of Education:  N/A   Occupational History  . Disabled    Social History Main Topics  . Smoking status: Never Smoker   . Smokeless tobacco: Never Used  . Alcohol Use: No  . Drug Use: No  . Sexual Activity: Yes    Birth Control/ Protection: Surgical   Other Topics Concern  . None   Social History Narrative   ** Merged History Encounter **       Additional History:    Sleep: good   Appetite: improved     Musculoskeletal: Strength & Muscle Tone: within normal limits Gait & Station: normal Patient leans: N/A   Psychiatric Specialty Exam: Physical Exam  Review of Systems  Constitutional: Negative for fever and chills.  Skin: Negative for rash.  Psychiatric/Behavioral: Positive for depression and hallucinations.  All other systems reviewed and are negative.   Blood pressure 119/45, pulse 93, temperature 98.3 F (36.8 C), temperature source Oral, resp. rate 16, height 5' 5.75" (1.67 m), weight 209 lb (94.802 kg), SpO2 98 %.Body mass index is 33.99 kg/(m^2).  General Appearance:  Improved grooming   Eye Contact::   Improved eye contact   Speech:  Clear and Coherent  Volume:  Normal  Mood:    Improved, less depressed, less anxious   Affect:    Becoming more reactive and less anxious   Thought Process:   More organized   Orientation:  Fully alert and attentive   Thought Content:  Chronic hallucinations, delusions , but as noted, states today has not experienced hallucinations thus far. Does not appear internally preoccupied at this time  Suicidal Thoughts:   today denies active SI,  Denies command hallucinations at this time.   Homicidal Thoughts:  No  Memory:  Recent and remote grossly intact   Judgement:  Fair  Insight:  Fair  Psychomotor Activity:   Improved , today not presenting with psychomotor agitation  Concentration:  Good  Recall:  Good  Fund of Knowledge:Good  Language: Good  Akathisia:  No  Handed:  Right  AIMS (if indicated):     Assets:  Social Support  ADL's:    Gradually improving   Cognition: WNL  Sleep:  Number of Hours: 6.75     Current Medications: Current Facility-Administered Medications  Medication Dose Route Frequency Provider Last Rate Last Dose  . acetaminophen (TYLENOL) tablet 650 mg  650 mg Oral Q6H PRN Laverle Hobby, PA-C   650 mg at 09/15/15 1406  . alum & mag hydroxide-simeth (MAALOX/MYLANTA) 200-200-20 MG/5ML suspension 30 mL  30 mL Oral Q4H PRN Laverle Hobby, PA-C      . ARIPiprazole (ABILIFY) tablet 5 mg  5 mg Oral QPM Saramma Eappen, MD   5 mg at 09/16/15 1707  . benztropine (COGENTIN) tablet 1 mg  1 mg Oral QPM Saramma Eappen, MD   1 mg at 09/16/15 1707  .  cloZAPine (CLOZARIL) tablet 50 mg  50 mg Oral QHS Ursula Alert, MD   50 mg at 09/16/15 2122  . FLUoxetine (PROZAC) capsule 40 mg  40 mg Oral Daily Laverle Hobby, PA-C   40 mg at 09/17/15 0810  . hydrOXYzine (ATARAX/VISTARIL) tablet 25 mg  25 mg Oral Q6H PRN Ursula Alert, MD   25 mg at 09/13/15 0933  . loperamide (IMODIUM) capsule 2 mg  2 mg Oral PRN Shuvon B Rankin, NP   2 mg at 09/10/15 1646  . LORazepam (ATIVAN) tablet 1 mg  1 mg Oral Q6H PRN Ursula Alert, MD   1 mg at 09/13/15 1819  . magnesium hydroxide (MILK OF MAGNESIA) suspension 30 mL  30 mL Oral Daily PRN Laverle Hobby, PA-C   30 mL at 09/17/15 1251  . menthol-cetylpyridinium (CEPACOL) lozenge 3 mg  1 lozenge Oral PRN Ursula Alert, MD      . metFORMIN (GLUCOPHAGE) tablet 500 mg  500 mg Oral Q breakfast Laverle Hobby, PA-C   500 mg at 09/17/15 0810  . risperiDONE (RISPERDAL) tablet 2 mg  2 mg Oral TID PRN Ursula Alert, MD   2 mg at 09/13/15 1819   Or  . OLANZapine (ZYPREXA) injection 5 mg  5 mg Intramuscular TID PRN Ursula Alert, MD      . paliperidone (INVEGA SUSTENNA) injection 156 mg  156 mg Intramuscular Q28 days Ursula Alert, MD   156 mg at 09/10/15 1541  . pantoprazole (PROTONIX) EC tablet 40 mg  40 mg Oral Daily Laverle Hobby, PA-C   40 mg at 09/17/15 0810  . propranolol (INDERAL)  tablet 10 mg  10 mg Oral BID Ursula Alert, MD   10 mg at 09/17/15 0809  . simvastatin (ZOCOR) tablet 20 mg  20 mg Oral QHS Laverle Hobby, PA-C   20 mg at 09/16/15 2122  . traZODone (DESYREL) tablet 50 mg  50 mg Oral QHS PRN Ursula Alert, MD        Lab Results:  Results for orders placed or performed during the hospital encounter of 09/09/15 (from the past 48 hour(s))  CBC with Differential/Platelet     Status: None   Collection Time: 09/16/15  7:05 AM  Result Value Ref Range   WBC 7.9 4.0 - 10.5 K/uL   RBC 4.84 3.87 - 5.11 MIL/uL   Hemoglobin 13.6 12.0 - 15.0 g/dL   HCT 42.5 36.0 - 46.0 %   MCV 87.8 78.0 - 100.0 fL   MCH 28.1 26.0 - 34.0 pg   MCHC 32.0 30.0 - 36.0 g/dL   RDW 13.4 11.5 - 15.5 %   Platelets 330 150 - 400 K/uL   Neutrophils Relative % 59 %   Neutro Abs 4.7 1.7 - 7.7 K/uL   Lymphocytes Relative 31 %   Lymphs Abs 2.4 0.7 - 4.0 K/uL   Monocytes Relative 9 %   Monocytes Absolute 0.7 0.1 - 1.0 K/uL   Eosinophils Relative 0 %   Eosinophils Absolute 0.0 0.0 - 0.7 K/uL   Basophils Relative 1 %   Basophils Absolute 0.1 0.0 - 0.1 K/uL    Comment: Performed at [facility]    Physical Findings: AIMS: Facial and Oral Movements Muscles of Facial Expression: None, normal Lips and Perioral Area: None, normal Jaw: None, normal Tongue: None, normal,Extremity Movements Upper (arms, wrists, hands, fingers): None, normal Lower (legs, knees, ankles, toes): None, normal, Trunk Movements Neck, shoulders, hips: None, normal, Overall Severity Severity of  abnormal movements (highest score from questions above): None, normal Incapacitation due to abnormal movements: None, normal Patient's awareness of abnormal movements (rate only patient's report): No Awareness, Dental Status Current problems with teeth and/or dentures?: No Does patient usually wear dentures?: No  CIWA:    COWS:       Assessment -patient continues to improve, with improved mood, improved  range of affect, decreased depression, and less anxiety and fear . She seems better related, with increased verbal output and improving eye contact . Of note, states that thus far today has not experienced auditory hallucinations. She is tolerating medications well thus far .     Treatment Plan Summary: Daily contact with patient to assess and evaluate symptoms and progress in treatment and Medication management  Pt received  Invega sustenna 156 mg IM - 09/10/15- repeat q28 days( for psychosis )  . Continue Clozaril  50 mg po qhs for  Antipsychotic augmentation  Continue Cogentin 1 mg po po daily for EPS. Continue Prozac 40 mg po daily for PTSD . Continue Trazodone to 50 mg po qhs prn for sleep.  Continue 1:1 precaution for safety /  In spite of improvement, pt  Has history of  suicide attempts as well as recent suicidal gestures while on the medical floor. Will continue to monitor vitals ,medication compliance and treatment side effects while patient is here. .  CSW  working on disposition options - consider discharge soon as she continues to improve  Encourage patient to continue her participation in therapeutic milieu  To work on Radiographer, therapeutic, enhance mood .    Medical Decision Making:  Review of Psycho-Social Stressors (1), Review or order clinical lab tests (1), Review of Last Therapy Session (1), Review of Medication Regimen & Side Effects (2) and Review of New Medication or Change in Dosage (2)     Neita Garnet , MD   09/17/2015, 4:27 PM

## 2015-09-17 NOTE — Progress Notes (Signed)
D. Pt has been very visible in the milieu, pt has been interacting well with both peers and staff. Pt stated she is feeling much better today. Pt went to the cafeteria to eat dinner, no problem has been reported or observed. Pt complied with her mication and unit rules. Pt continues to be very flat and depressed on the unit today.  Pt reported that her depression was a 0, her hopelessness was a 0, and that her anxiety was a 0. Pt reported being negative SI/HI, no AH/VH noted. A: !;1 and  15 min checks continued for patient safety. R: Pts safety maintained.

## 2015-09-17 NOTE — BHH Group Notes (Signed)
[name] LCSW Aftercare Discharge Planning Group Note   09/17/2015 11:04 AM  Participation Quality:  Invited.  Chose to not attend    [name] [name], [name] [name]

## 2015-09-17 NOTE — Progress Notes (Signed)
Nursing 1;1 Note: Pt observed sitting in dayroom watching TV. Pt went for lunch at the cafeteria without any problems. Pt complained of stomachache after eating lunch, milk of mag given as ordered.  Pt denies SI at this time, staff remains with patient, safety maintained, will continue to momitor

## 2015-09-17 NOTE — BHH Group Notes (Signed)
[facility] LCSW Group Therapy  09/17/2015 1:56 PM  Type of Therapy: Group Therapy  Participation Level: Not Active  Participation Quality: Attentive  Affect: Flat  Cognitive: Oriented  Insight: Limited  Engagement in Therapy: Engaged  Modes of Intervention: Discussion and Socialization  Summary of Progress/Problems: [name] [name] from the [facility] was here to tell his story of recovery and play his guitar. Pt was alert throughout the entire group but did not participate or make comments. Pt left towards the end of group and did not return.  Kara Mead. Marshell Levan 09/17/2015 1:56 PM

## 2015-09-18 DIAGNOSIS — F250 Schizoaffective disorder, bipolar type: Secondary | ICD-10-CM | POA: Diagnosis not present

## 2015-09-18 MED ORDER — TRAZODONE HCL 50 MG PO TABS: 50.0000 mg | ORAL_TABLET | Freq: Every evening | ORAL | Status: DC | PRN

## 2015-09-18 MED ORDER — HYDROXYZINE HCL 25 MG PO TABS: 25.0000 mg | ORAL_TABLET | Freq: Four times a day (QID) | ORAL | Status: DC | PRN

## 2015-09-18 MED ORDER — INFLUENZA VAC SPLIT QUAD 0.5 ML IM SUSY
0.5000 mL | PREFILLED_SYRINGE | INTRAMUSCULAR | Status: AC
  Administered 2015-09-18: 0.5 mL via INTRAMUSCULAR
  Filled 2015-09-18: qty 0.5

## 2015-09-18 MED ORDER — PROPRANOLOL HCL 10 MG PO TABS: 10.0000 mg | ORAL_TABLET | Freq: Two times a day (BID) | ORAL | Status: DC

## 2015-09-18 MED ORDER — CLOZAPINE 50 MG PO TABS: 50.0000 mg | ORAL_TABLET | Freq: Every day | ORAL | Status: DC

## 2015-09-18 MED ORDER — METFORMIN HCL 500 MG PO TABS: 500.0000 mg | ORAL_TABLET | Freq: Every day | ORAL | Status: DC

## 2015-09-18 MED ORDER — FLUOXETINE HCL 40 MG PO CAPS: 40.0000 mg | ORAL_CAPSULE | Freq: Every day | ORAL | Status: DC

## 2015-09-18 MED ORDER — SIMVASTATIN 20 MG PO TABS: 20.0000 mg | ORAL_TABLET | Freq: Every day | ORAL | Status: DC

## 2015-09-18 MED ORDER — ARIPIPRAZOLE 5 MG PO TABS: 5.0000 mg | ORAL_TABLET | Freq: Every evening | ORAL | Status: DC

## 2015-09-18 MED ORDER — PALIPERIDONE PALMITATE 156 MG/ML IM SUSP: 156.0000 mg | INTRAMUSCULAR | Status: DC

## 2015-09-18 MED ORDER — LORAZEPAM 1 MG PO TABS: 1.0000 mg | ORAL_TABLET | Freq: Four times a day (QID) | ORAL | Status: DC | PRN

## 2015-09-18 MED ORDER — BENZTROPINE MESYLATE 1 MG PO TABS: 1.0000 mg | ORAL_TABLET | Freq: Every evening | ORAL | Status: DC

## 2015-09-18 MED ORDER — OMEPRAZOLE 40 MG PO CPDR: 40.0000 mg | DELAYED_RELEASE_CAPSULE | Freq: Every day | ORAL | Status: DC

## 2015-09-18 NOTE — Progress Notes (Signed)
1:1 DAR Note 1400 [name] [name] has been in the day room much of the day.  She has attended all groups.  She denies any SI/HI.  She continues to hear [place] but no plans to harm self and she is able to contract for safety.  1:1 remains at her side.  1:1 will continue until discharge.  Q 15 minute checks maintained for safety.  She denies any physical complaints.  She appears to be in no physical distress.  She completed her self inventory and reports her depression 8/10, hopelessness 2/10 and anxiety 6/10.  Her goal today is to go home and pet her dog Shamus.  She requested the flu shot.  Obtained order and given in right deltoid.  We will continue to monitor the progress towards her goals.

## 2015-09-18 NOTE — Progress Notes (Signed)
1:1 progress note:  Pt lying in bed with eyes closed.  No distress observed.  Respirations even and unlabored.  Continue 1:1 for safety.  Sitter at bedside.  Pt safe at this time.

## 2015-09-18 NOTE — Progress Notes (Signed)
  [facility] Adult Case Management Discharge Plan :  Will you be returning to the same living situation after discharge:  Yes,  home At discharge, do you have transportation home?: Yes,  family Do you have the ability to pay for your medications: Yes,  MCD  Release of information consent forms completed and in the chart;  Patient's signature needed at discharge.  Patient to Follow up at: Follow-up Information    Follow up with [facility] On 09/19/2015.   Why:  [facility] team will come to the home @11am    Contact information:   [facility] [address] [place], [place] [address] [phone]      Patient denies SI/HI: Yes,  yes    Safety Planning and Suicide Prevention discussed: Yes,  yes  Have you used any form of tobacco in the last 30 days? (Cigarettes, Smokeless Tobacco, Cigars, and/or Pipes): No  Has patient been referred to the Quitline?: N/A patient is not a smoker  [place] B 09/18/2015, 10:54 AM

## 2015-09-18 NOTE — BHH Suicide Risk Assessment (Signed)
[facility] Discharge Suicide Risk Assessment   Demographic Factors:  44 year old female, lives with her mother, is on disability, has two adult children  Total Time spent with patient: 30 minutes  Musculoskeletal: Strength & Muscle Tone: within normal limits Gait & Station: normal Patient leans: N/A  Psychiatric Specialty Exam: Physical Exam  ROS  Blood pressure 118/70, pulse 92, temperature 98.3 F (36.8 C), temperature source Oral, resp. rate 16, height 5' 5.75" (1.67 m), weight 209 lb (94.802 kg), SpO2 98 %.Body mass index is 33.99 kg/(m^2).  General Appearance: Well Groomed  Eye Contact::  Good  Speech:  Normal Rate409  Volume:  Normal  Mood:  improved, " more positive", denies depression  Affect:  more reactive   Thought Process:  Linear  Orientation:  Full (Time, Place, and Person)  Thought Content:  chronic hallucinations, but have improved compared to before and she states that they are now occasional and distant, denies command hallucinations, no delusions  Suicidal Thoughts:  No- denies any suicidal ideations or self injurious ideations  Homicidal Thoughts:  No  Memory:  recent and remote grossly intact   Judgement:  Other:  improved   Insight:  improved   Psychomotor Activity:  Normal  Concentration:  Good  Recall:  Good  Fund of Knowledge:Good  Language: Good  Akathisia:  Negative  Handed:  Right  AIMS (if indicated):     Assets:  Communication Skills Desire for Improvement Housing Physical Health Social Support  Sleep:  Number of Hours: 6.75  Cognition: WNL  ADL's:   Improved    Have you used any form of tobacco in the last 30 days? (Cigarettes, Smokeless Tobacco, Cigars, and/or Pipes): No  Has this patient used any form of tobacco in the last 30 days? (Cigarettes, Smokeless Tobacco, Cigars, and/or Pipes) No  Mental Status Per Nursing Assessment::   On Admission:  Suicidal ideation indicated by patient, Self-harm thoughts, Self-harm behaviors, Belief that  plan would result in death  Current Mental Status by Physician: At this time patient is significantly improved compared to her admission presentation - her mood is better and currently not depressed, her affect is brighter , she is not thought disordered , she is not suicidal or homicidal , and the chronic hallucinations she has had for years ( voice of a woman called "[place]") have almost resolved, and are now more occasional and distant . She is future oriented, and looking forward to returning home and seeing her dog.   Loss Factors: Going through divorce proceedings.   Historical Factors:  History of Schizoaffective  Disorder , several psychiatric admissions in the past , history of suicide attempts   Risk Reduction Factors:   Sense of responsibility to family, Living with another person, especially a relative, Positive social support and Positive coping skills or problem solving skills  Continued Clinical Symptoms:  As noted, much improved compared to admission and looking forward to discharge  Of note, with patient's consent I spoke with her mother over the phone- she corroborates that Angelik seems much improved, doing well currently, and is in agreement with discharge today.   Cognitive Features That Contribute To Risk:  No gross cognitive deficits noted upon discharge. Is alert , attentive, and oriented x 3   Suicide Risk:  Mild:  Suicidal ideation of limited frequency, intensity, duration, and specificity.  There are no identifiable plans, no associated intent, mild dysphoria and related symptoms, good self-control (both objective and subjective assessment), few other risk factors, and identifiable  protective factors, including available and accessible social support.  Principal Problem: Schizoaffective disorder, bipolar type Discharge Diagnoses:  Patient Active Problem List   Diagnosis Date Noted  . 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 Schizoaffective disorder, bipolar type  [F25.0] 09/10/2015  . Hx of borderline personality disorder [Z86.59] 09/10/2015  . PTSD (post-traumatic stress disorder) [F43.10] 09/10/2015  . Suicide attempt [T14.91] 09/05/2015  . Overdose, drug [T50.901A] 09/05/2015  . Overdose [T50.901A] 09/05/2015  . Drug overdose [T50.901A]   . QT prolongation [I45.81]   . Weight gain [R63.5] 03/06/2015  . Flushing [R23.2] 03/06/2015  . Iron deficiency anemia, unspecified [D50.9] 11/15/2013  . Liver mass, left lobe [R16.0] 05/28/2013  . Postop check [Z09] 03/13/2013  . Postoperative wound infection [T81.4XXA] 02/23/2013  . History of colostomy [Z87.19] 02/16/2013  . Borderline personality disorder [F60.3] 08/16/2012    Class: Chronic  . Acute blood loss anemia [D62] 08/01/2012  . Depression, major [F32.2] 08/01/2012  . Renal calculus, left [N20.0] 03/31/2012  . Hydronephrosis, left [N13.30] 03/31/2012    Follow-up Information    Follow up with [facility] On 09/19/2015.   Why:  [facility] team will come to the home @11am    Contact information:   [facility] [address] [place], [place] [address] [phone]      Plan Of Care/Follow-up recommendations:  Activity:  as tolerated  Diet:  regular  Tests:  NA Other:  See below   Is patient on multiple antipsychotic therapies at discharge:  No   Has Patient had three or more failed trials of antipsychotic monotherapy by history:  No  Recommended Plan for Multiple Antipsychotic Therapies: NA  Patient is being discharged in good spirits . Returning home- lives with mother  Follow up as above . Has a PCP, Dr. Justin Mend for medical issues as needed    [place], [facility] 09/18/2015, 9:08 AM

## 2015-09-18 NOTE — Progress Notes (Signed)
Pt is still resting in bed with eyes closed.  No distress observed at this time.  Respirations even and unlabored.  Pt has slept well throughout the night.  Continue 1:1 for safety.  Sitter at bedside.  Pt remains safe.

## 2015-09-18 NOTE — BHH Group Notes (Signed)
[facility] Group Notes:  (Nursing/MHT/Case Management/Adjunct)  Date:  09/18/2015  Time:  12:48 PM  Type of Therapy:  Nurse Education  Participation Level:  Active  Participation Quality:  Appropriate and Attentive  Affect:  Flat  Cognitive:  Alert and Appropriate  Insight:  Improving  Engagement in Group:  Engaged and Supportive  Modes of Intervention:  Discussion and Education  Summary of Progress/Problems:  Group topic today was leisure and lifestyle changes.  Topics included working on goals, coping skills an appropriate leisure activities.  [name] [name] states that she likes to pet her dog, Shamous, when she is feeling down.  Her goal for today is to "go home and love on my dog."  [name] [name] 09/18/2015, 12:48 PM

## 2015-09-18 NOTE — Progress Notes (Signed)
1:1 DAR Note 1000 [name] [name] has been visible on the unit.  Pleasant and cooperative.  She denies any SI/HI.  She continues to hear the voice of [place] but she isn't telling her to harm herself or others.  She has been attending groups.  Interacting with peers.  1:1 continued for safety and safety sitter by her side.  q 15 minute checks maintained for safety.  1:1 continued for safety.  Encouraged her to continue to participate in groups/unit activities.

## 2015-09-18 NOTE — Progress Notes (Signed)
D/C Note Pt. D/C from the unit to lobby accompanied by her mother.  Pleasant and cooperative. Voiced no SI/HI or A/V hallucinations at this time.  Pt. Denies any pain or discomfort.  D/C instructions and medications reviewed with pt.  Pt. verbalized understanding of medications and d/c instructions.   All belongings (from locker #2) returned to pt. Q 15 min checks maintained until discharge.  Encouraged her to given the paperwork to her ACTT team tomorrow so they will be able to follow up with her d/c medications.  Pt. Left the unit in no apparent distress.  Phone number given for [name] [name] to follow up on missing lotion/post cards and she will follow up with them soon.

## 2015-09-18 NOTE — Discharge Summary (Signed)
Physician Discharge Summary Note  Patient:  [name] [name] is an 44 y.o., female MRN:  [id number] DOB:  May 06, 1971 Patient phone:  [phone] (home)  Patient address:   [address] [place] [address],  Total Time spent with patient: Greater than 30 minutes  Date of Admission:  09/09/2015  Date of Discharge: 09-18-15  Reason for Admission:  Mood stabilization treatments  Principal Problem: Schizoaffective disorder, bipolar type Discharge Diagnoses: Patient Active Problem List   Diagnosis Date Noted  . 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 Schizoaffective disorder, bipolar type [F25.0] 09/10/2015  . Hx of borderline personality disorder [Z86.59] 09/10/2015  . PTSD (post-traumatic stress disorder) [F43.10] 09/10/2015  . Suicide attempt [T14.91] 09/05/2015  . Overdose, drug [T50.901A] 09/05/2015  . Overdose [T50.901A] 09/05/2015  . Drug overdose [T50.901A]   . QT prolongation [I45.81]   . Weight gain [R63.5] 03/06/2015  . [place] [R23.2] 03/06/2015  . Iron deficiency anemia, unspecified [D50.9] 11/15/2013  . Liver mass, left lobe [R16.0] 05/28/2013  . Postop check [Z09] 03/13/2013  . Postoperative wound infection [T81.4XXA] 02/23/2013  . History of colostomy [Z87.19] 02/16/2013  . Borderline personality disorder [F60.3] 08/16/2012    Class: Chronic  . Acute blood loss anemia [D62] 08/01/2012  . Depression, major [F32.2] 08/01/2012  . Renal calculus, left [N20.0] 03/31/2012  . Hydronephrosis, left [N13.30] 03/31/2012    Musculoskeletal: Strength & Muscle Tone: within normal limits Gait & Station: normal Patient leans: N/A  Psychiatric Specialty Exam: Physical Exam  Psychiatric: Her speech is normal and behavior is normal. Judgment and thought content normal. Her mood appears not anxious. Her affect is not angry, not blunt, not labile and not inappropriate. Cognition and memory are normal. She does not exhibit a depressed mood.    Review of Systems   Constitutional: Negative.   HENT: Negative.   Eyes: Negative.   Respiratory: Negative.   Cardiovascular: Negative.   Gastrointestinal: Negative.   Genitourinary: Negative.   Musculoskeletal: Negative.   Skin: Negative.   Neurological: Negative.   Endo/Heme/Allergies: Negative.   Psychiatric/Behavioral: Positive for depression (Stable) and hallucinations (Hx of). Negative for suicidal ideas, memory loss and substance abuse. The patient has insomnia (Stable). The patient is not nervous/anxious.     Blood pressure 118/70, pulse 92, temperature 98.3 F (36.8 C), temperature source Oral, resp. rate 16, height 5' 5.75" (1.67 m), weight 94.802 kg (209 lb), SpO2 98 %.Body mass index is 33.99 kg/(m^2).  See Md's SRA  Have you used any form of tobacco in the last 30 days? (Cigarettes, Smokeless Tobacco, Cigars, and/or Pipes): No  Has this patient used any form of tobacco in the last 30 days? (Cigarettes, Smokeless Tobacco, Cigars, and/or Pipes) No  Past Medical History:  Past Medical History  Diagnosis Date  . Heart murmur     asa child   . Anemia     hx of   . Depression   . Anxiety   . Anemia   . Chronic kidney disease     kidney stone   . Chronic kidney disease   . Renal insufficiency   . Blood dyscrasia   . Iron deficiency anemia, unspecified 11/15/2013  . Iron deficiency anemia, unspecified 11/15/2013    Past Surgical History  Procedure Laterality Date  . Other surgical history      cyst removed from ovary ? side   . Nephrolithotomy  03/31/2012    Procedure: NEPHROLITHOTOMY PERCUTANEOUS;  Surgeon: Claybon Jabs, MD;  Location: [facility];  Service: Urology;  Laterality:  Left;  . Tubal ligation    . Laparotomy  07/31/2012    Procedure: EXPLORATORY LAPAROTOMY;  Surgeon: Harl Bowie, MD;  Location: [facility];  Service: General;  Laterality: N/A;  REPAIR OF PANCREATIC INJURY, EXPLORATION OF RETROPERITONEUM.  [name] Colostomy  07/31/2012    Procedure: COLOSTOMY;  Surgeon: Harl Bowie, MD;  Location: [facility];  Service: General;  Laterality: Right;  . Colostomy reversal    . Gws  2013    ABDOMINAL SURGERY  . Colostomy closure N/A 02/15/2013    Procedure: COLOSTOMY CLOSURE;  Surgeon: Gwenyth Ober, MD;  Location: [facility];  Service: General;  Laterality: N/A;  Reversal of colostomy  . Dilatation & curettage/hysteroscopy with trueclear N/A 10/24/2013    Procedure: DILATATION & CURETTAGE/HYSTEROSCOPY WITH TRUECLEAR, CERVICAL BLOCK;  Surgeon: Marylynn Pearson, MD;  Location: [facility];  Service: Gynecology;  Laterality: N/A;  . Breast reduction  06/2014   Family History:  Family History  Problem Relation Age of Onset  . Cancer Mother     kidney  . Depression Mother   . Other Neg Hx     adrenal problem   Social History:  History  Alcohol Use No     History  Drug Use No    Social History   Social History  . Marital Status: Legally Separated    Spouse Name: N/A  . Number of Children: 2  . Years of Education: N/A   Occupational History  . Disabled    Social History Main Topics  . Smoking status: Never Smoker   . Smokeless tobacco: Never Used  . Alcohol Use: No  . Drug Use: No  . Sexual Activity: Yes    Birth Control/ Protection: Surgical   Other Topics Concern  . None   Social History Narrative   ** Merged History Encounter **       Risk to Self: Is patient at risk for suicide?: Yes What has been your use of drugs/alcohol within the last 12 months?: Pt's mother denies  Risk to Others: No Prior Inpatient Therapy: Yes Prior Outpatient Therapy: Yes  Level of Care:  OP  Hospital Course:  [name] [name] is a 44 y.o. Caucasian female, divorced , lives with her mother [name] [name] , has a past medical history of Schizoaffective disorder , several suicide attempts , Heart murmur; Anemia;Chronic kidney disease.Patient as per initial notes in EHR , over dosed on 590 mg of children's Benadryl on 09/05/2015 and drove herself to the [place] . Pt reported having AH  of her friend '[place] " who asked her to kill herself. Pt also had a suicide note with her with phone numbers of people who needed to be contacted along with her suicide note . Poison control was called recommending observation and monitoring on telemetry. Patient also had some degree of renal insufficiency which expected to slow down clearance. Patient was admitted to [facility] hospital -5 w . Pt on 09/06/2015 - as per IM notes was seen with telemetry wires around her neck , in a possible attempt to strangulate herself. Pt was noted to be responding to internal stimuli , actively hallucinating the entire time she was on IM service . Pt's psychotropic medications were on hold due to her being medically unstable .Pt was medically cleared and transferred after being evaluated by consult service to [facility] on 09/09/2015.  While a patient in this hospital, Hudson received medication management for mood stabilization treatments. She was medicated & discharged on; Abilify 5  Mg for mood control, Benztropine 1 mg  for EPS, Clozaril 50  mg for mood control, Fluoxetine 40 mg for depression, Hydroxyzine 25 mg for anxiety prn, Lorazepam 1 mg for severe anxiety, Propranolol 10 mg for anxiety,Trazodone 50 mg for insomnia & Invega Sustenna 156/ml for mood control. Jayleah was enrolled in the Group counseling sessions being offered and held on this unit. She learned coping skills that should help her cope better & maintain mood stability after discharge. She was resumed on all her pertinent home medications for all her pre-existing medical issues & concerns. She tolerated her treatment regimen without any adverse effects or reactions.  Coren was medicated and discharged on 3 separate antipsychotic medications due to the severity & chronic nature of her mental illness. Also, her symptoms has failed at numerous times to respond well under an antipsychotic monotherapy.The failed antipsychotic manotherapies includes; Zyprexa, Abilify,  Prolixin etc.  However, the combination therapies of Abilify, Kirt Boys & Clozaril seem to have helped improve her symptoms. As a result, it is necessary for Jolan to continue on these combination therapies to achieve maximum symptom control after discharge. However, in the event that her symptoms happen to improve or decrease, she can then be titrated down to an antipsychotic monotherapy to avoid risks for metabolic syndrome and other related adverse effects.This has to be done within the proper evaluation and judgement of her outpatient provider.   During the course of her hospital stay, Hiromi was evaluated each day by a clinical provider to assure her response to her treatment regimen. Improvement was noted by her report of decreasing symptoms, improved sleep and appetite, affect, medication tolerance, behavior, and participation in unit the programming. She was required each day to complete a self inventory assessment noting mood, mental status, pain, new symptoms, anxiety or concerns. Her symptoms responded well to the ordered medication regimen. Also being in a therapeutic and supportive environment assisted in her mood stability. Positive and appropriate behavior was noted & Jaleiyah was motivated for recovery. However, Emmagrace's history included several admissions to other mental health institutions. She has had several medication trials in the past but with little to no resolution of her symptoms.    On this day of her hospital discharge, Laurenis in much improved condition than upon admission. She contracted for safety and felt more in control of her thoughts. She was motivated to continue taking medication with a goal of continued improvement in mental health. Beatric was discharged home with a plan to follow care up as noted below. She was picked up by her family. Consults:  psychiatry  Significant Diagnostic Studies:  labs: CBC with diff, CMP, UDS, toxicology tests, U/A, results reviewed,  stable  Discharge Vitals:   Blood pressure 118/70, pulse 92, temperature 98.3 F (36.8 C), temperature source Oral, resp. rate 16, height 5' 5.75" (1.67 m), weight 94.802 kg (209 lb), SpO2 98 %. Body mass index is 33.99 kg/(m^2). Lab Results:   Results for orders placed or performed during the hospital encounter of 09/09/15 (from the past 72 hour(s))  CBC with Differential/Platelet     Status: None   Collection Time: 09/16/15  7:05 AM  Result Value Ref Range   WBC 7.9 4.0 - 10.5 K/uL   RBC 4.84 3.87 - 5.11 MIL/uL   Hemoglobin 13.6 12.0 - 15.0 g/dL   HCT 42.5 36.0 - 46.0 %   MCV 87.8 78.0 - 100.0 fL   MCH 28.1 26.0 - 34.0 pg   MCHC 32.0 30.0 -  36.0 g/dL   RDW 13.4 11.5 - 15.5 %   Platelets 330 150 - 400 K/uL   Neutrophils Relative % 59 %   Neutro Abs 4.7 1.7 - 7.7 K/uL   Lymphocytes Relative 31 %   Lymphs Abs 2.4 0.7 - 4.0 K/uL   Monocytes Relative 9 %   Monocytes Absolute 0.7 0.1 - 1.0 K/uL   Eosinophils Relative 0 %   Eosinophils Absolute 0.0 0.0 - 0.7 K/uL   Basophils Relative 1 %   Basophils Absolute 0.1 0.0 - 0.1 K/uL    Comment: Performed at [facility]    Physical Findings: AIMS: Facial and Oral Movements Muscles of Facial Expression: None, normal Lips and Perioral Area: None, normal Jaw: None, normal Tongue: None, normal,Extremity Movements Upper (arms, wrists, hands, fingers): None, normal Lower (legs, knees, ankles, toes): None, normal, Trunk Movements Neck, shoulders, hips: None, normal, Overall Severity Severity of abnormal movements (highest score from questions above): None, normal Incapacitation due to abnormal movements: None, normal Patient's awareness of abnormal movements (rate only patient's report): No Awareness, Dental Status Current problems with teeth and/or dentures?: No Does patient usually wear dentures?: No  CIWA:    COWS:     See Psychiatric Specialty Exam and Suicide Risk Assessment completed by Attending Physician  prior to discharge.  Discharge destination:  Home  Is patient on multiple antipsychotic therapies at discharge:  Yes,   Do you recommend tapering to monotherapy for antipsychotics?  Yes   Has Patient had three or more failed trials of antipsychotic monotherapy by history:  Yes,   Antipsychotic medications that previously failed include:   1.  Abilify., 2.  Prolixin. and 3.  Zyprexa.  Recommended Plan for Multiple Antipsychotic Therapies: And because patient has not been able to achieve symptoms control under an antipsychotic monotherapy, she is currently receiving and being discharged on 3 separate antipsychotic medications; Kirt Boys, Abilify  & Clozaril which seem effective at this time. It will benefit this patient to continue on these combination antipsychotic therapies as recommended. However, as her symptoms continue to improve, patient may be titrated down to an antipsychotic monotherapy to the discretion and proper judgement of her outpatient provider.    Medication List    STOP taking these medications        ALPRAZolam 0.25 MG tablet  Commonly known as:  XANAX      TAKE these medications      Indication   ARIPiprazole 5 MG tablet  Commonly known as:  ABILIFY  Take 1 tablet (5 mg total) by mouth every evening. For mood control   Indication:  Mood control     benztropine 1 MG tablet  Commonly known as:  COGENTIN  Take 1 tablet (1 mg total) by mouth every evening. For prevention of EPS   Indication:  Extrapyramidal Reaction caused by Medications     clozapine 50 MG tablet  Commonly known as:  CLOZARIL  Take 1 tablet (50 mg total) by mouth at bedtime. For mood control   Indication:  Mood control     FLUoxetine 40 MG capsule  Commonly known as:  PROZAC  Take 1 capsule (40 mg total) by mouth daily. For depression   Indication:  Major Depressive Disorder     hydrOXYzine 25 MG tablet  Commonly known as:  ATARAX/VISTARIL  Take 1 tablet (25 mg total) by mouth every 6  (six) hours as needed for anxiety.   Indication:  Anxiety     LORazepam 1  MG tablet  Commonly known as:  ATIVAN  Take 1 tablet (1 mg total) by mouth every 6 (six) hours as needed (severe anxiety/agitation).   Indication:  Severe anxiety     metFORMIN 500 MG tablet  Commonly known as:  GLUCOPHAGE  Take 1 tablet (500 mg total) by mouth daily with breakfast. For diabetes management   Indication:  Type 2 Diabetes     omeprazole 40 MG capsule  Commonly known as:  PRILOSEC  Take 1 capsule (40 mg total) by mouth daily. For acid reflux   Indication:  Gastroesophageal Reflux Disease     paliperidone 156 MG/ML Susp injection  Commonly known as:  INVEGA SUSTENNA  Inject 1 mL (156 mg total) into the muscle every 28 (twenty-eight) days. (Last dose given on 09-10-15): For mood control   Indication:  Mood control     propranolol 10 MG tablet  Commonly known as:  INDERAL  Take 1 tablet (10 mg total) by mouth 2 (two) times daily. For anxiety   Indication:  Anxiety     simvastatin 20 MG tablet  Commonly known as:  ZOCOR  Take 1 tablet (20 mg total) by mouth at bedtime. For high cholesterol   Indication:  Inherited Homozygous Hypercholesterolemia     traZODone 50 MG tablet  Commonly known as:  DESYREL  Take 1 tablet (50 mg total) by mouth at bedtime as needed for sleep.   Indication:  Trouble Sleeping       Follow-up Information    Follow up with [facility] On 09/19/2015.   Why:  [facility] team will come to the home @11am    Contact information:   [facility] Team [address] [place], [place] [address] [phone]     Follow-up recommendations:  Activity:  As tolerated Diet: As recommended by your primary care doctor. Keep all scheduled follow-up appointments as recommended.  Comments: Take all your medications as prescribed by your mental healthcare provider. Report any adverse effects and or reactions from your medicines to your outpatient provider  promptly. Patient is instructed and cautioned to not engage in alcohol and or illegal drug use while on prescription medicines. In the event of worsening symptoms, patient is instructed to call the crisis hotline, 911 and or go to the nearest ED for appropriate evaluation and treatment of symptoms. Follow-up with your primary care provider for your other medical issues, concerns and or health care needs.   Total Discharge Time: Greater than 30 minutes  Signed: Encarnacion Slates, PMHNP, FNP-BC 09/18/2015, 3:11 PM   Patient seen, Suicide Assessment Completed.  Disposition Plan Reviewed

## 2015-09-18 NOTE — Tx Team (Signed)
Interdisciplinary Treatment Plan Update (Adult)  Date:  09/18/2015 Time Reviewed:  11:52 AM  Progress in Treatment: Attending groups: Yes. Participating in groups: Yes. Taking medication as prescribed:  Yes. Tolerating medication:  Yes. Family/Significant othe contact made:  Yes, individual(s) contacted:  Gearldine Shown (mother) [phone] Patient understands diagnosis:  Yes, as evidenced by seeking help with paranoia Discussing patient identified problems/goals with staff:  Yes Medical problems stabilized or resolved:  Yes Denies suicidal/homicidal ideation: Yes. Issues/concerns per patient self-inventory: No. Other:  New problem(s) identified: NA  Discharge Plan or Barriers: Pt will return home to live with her mother and continuing receiving serviced from ACT team.  Reason for Continuation of Hospitalization:   Comments:  [name] [name] is a 44 y.o. Caucasian female, divorced , lives with her mother [name] [name] , has a past medical history of Schizoaffective disorder , several suicide attempts , Heart murmur; Anemia;Chronic kidney disease.Patient as per initial notes in EHR , over dosed on 590 mg of children's Benadryl on 09/05/2015 and drove herself to the [place] . Pt reported having AH of her friend '[place] " who asked her to kill herself. Pt also had a suicide note with her with phone numbers of people who needed to be contacted along with her suicide note .   Estimated length of stay: D/C today  New goal(s):  Review of initial/current patient goals per problem list:  1. Goal(s): Patient will participate in aftercare plan  Met:Yes  Target date: at discharge  As evidenced by: Patient will participate within aftercare plan AEB aftercare provider and housing plan at discharge being identified.  09/18/2015: Pt is agreeable to continuing to work with her ACT team.   2. Goal (s): Patient will exhibit decreased depressive symptoms and suicidal  ideations.  Met:Yes  Target date: at discharge  As evidenced by: Patient will utilize self rating of depression at 3 or below and demonstrate decreased signs of depression or be deemed stable for discharge by MD. 09/12/15: Denies symptoms of depression and reports that the voices are currently not telling her to kill herself. Will continue to monitor.  09/18/2015 No signs nor symptoms of depression today  3. Goal(s): Patient will demonstrate decreased signs and symptoms of anxiety.  Met:Yes  Target date: at discharge  As evidenced by: Patient will utilize self rating of anxiety at 3 or below and demonstrated decreased signs of anxiety, or be deemed stable for discharge by MD  09/12/15: Pt reports that anxiety is not an issue for her today.  4. Goal(s): Patient will demonstrate decreased signs of psychosis.  Met: Yes  Target date:at discharge  As evidenced by: Patient will demonstrate decreased signs of psychosis as evidenced by a reduction in AVH, paranoia, and/or delusions.   09/12/15: Pt continues to report hearing "[place]". 09/18/2015 While [place] is still present, [name] [name] is goal directed and organized     Attendees: Patient:  09/18/2015 11:52 AM  Family:   09/18/2015 11:52 AM  Physician:  Neita Garnet MD 09/18/2015 11:52 AM  Nursing: Manuella Ghazi, RN 09/18/2015 11:52 AM  Case Manager:  Roque Lias LCSW 09/18/2015 11:52 AM  Counselor:  Matthew Saras, MSW Intern 09/18/2015 11:52 AM  Other:   09/18/2015 11:52 AM  Other:   09/18/2015 11:52 AM  Other:   09/18/2015 11:52 AM  Other:  09/18/2015 11:52 AM  Other:    Other:    Other:    Other:    Other:    Other:      Scribe  for Treatment Team:   Georga Kaufmann, MSW Intern 09/18/2015 11:52 AM

## 2015-09-19 DIAGNOSIS — R109 Unspecified abdominal pain: Secondary | ICD-10-CM | POA: Diagnosis not present

## 2015-09-19 DIAGNOSIS — K469 Unspecified abdominal hernia without obstruction or gangrene: Secondary | ICD-10-CM | POA: Diagnosis not present

## 2015-09-19 DIAGNOSIS — K439 Ventral hernia without obstruction or gangrene: Secondary | ICD-10-CM | POA: Diagnosis not present

## 2015-09-19 DIAGNOSIS — J90 Pleural effusion, not elsewhere classified: Secondary | ICD-10-CM | POA: Diagnosis not present

## 2015-09-19 DIAGNOSIS — K2900 Acute gastritis without bleeding: Secondary | ICD-10-CM | POA: Diagnosis not present

## 2015-09-24 DIAGNOSIS — K469 Unspecified abdominal hernia without obstruction or gangrene: Secondary | ICD-10-CM | POA: Diagnosis not present

## 2015-09-24 DIAGNOSIS — R079 Chest pain, unspecified: Secondary | ICD-10-CM | POA: Diagnosis not present

## 2015-09-24 DIAGNOSIS — R0789 Other chest pain: Secondary | ICD-10-CM | POA: Diagnosis not present

## 2015-09-24 DIAGNOSIS — R0602 Shortness of breath: Secondary | ICD-10-CM | POA: Diagnosis not present

## 2015-10-01 DIAGNOSIS — Z79899 Other long term (current) drug therapy: Secondary | ICD-10-CM | POA: Diagnosis not present

## 2015-10-01 DIAGNOSIS — F251 Schizoaffective disorder, depressive type: Secondary | ICD-10-CM | POA: Diagnosis not present

## 2015-10-03 ENCOUNTER — Other Ambulatory Visit: Payer: Self-pay | Admitting: Surgery

## 2015-10-03 DIAGNOSIS — K432 Incisional hernia without obstruction or gangrene: Secondary | ICD-10-CM | POA: Diagnosis not present

## 2015-10-09 DIAGNOSIS — K529 Noninfective gastroenteritis and colitis, unspecified: Secondary | ICD-10-CM | POA: Diagnosis not present

## 2015-10-09 DIAGNOSIS — F332 Major depressive disorder, recurrent severe without psychotic features: Secondary | ICD-10-CM | POA: Diagnosis not present

## 2015-10-13 DIAGNOSIS — Z79899 Other long term (current) drug therapy: Secondary | ICD-10-CM | POA: Diagnosis not present

## 2015-10-14 ENCOUNTER — Encounter (HOSPITAL_COMMUNITY): Payer: Self-pay

## 2015-10-14 ENCOUNTER — Emergency Department (HOSPITAL_COMMUNITY)
Admission: EM | Admit: 2015-10-14 | Discharge: 2015-10-20 | Disposition: A | Discharge status: Home / Self Care | Payer: Medicare Other | Attending: Emergency Medicine | Admitting: Emergency Medicine

## 2015-10-14 DIAGNOSIS — T1491 Suicide attempt: Secondary | ICD-10-CM | POA: Insufficient documentation

## 2015-10-14 DIAGNOSIS — Y9389 Activity, other specified: Secondary | ICD-10-CM | POA: Insufficient documentation

## 2015-10-14 DIAGNOSIS — Z3202 Encounter for pregnancy test, result negative: Secondary | ICD-10-CM | POA: Insufficient documentation

## 2015-10-14 DIAGNOSIS — Z862 Personal history of diseases of the blood and blood-forming organs and certain disorders involving the immune mechanism: Secondary | ICD-10-CM | POA: Diagnosis not present

## 2015-10-14 DIAGNOSIS — Y9289 Other specified places as the place of occurrence of the external cause: Secondary | ICD-10-CM | POA: Insufficient documentation

## 2015-10-14 DIAGNOSIS — F419 Anxiety disorder, unspecified: Secondary | ICD-10-CM | POA: Insufficient documentation

## 2015-10-14 DIAGNOSIS — E119 Type 2 diabetes mellitus without complications: Secondary | ICD-10-CM | POA: Diagnosis not present

## 2015-10-14 DIAGNOSIS — F329 Major depressive disorder, single episode, unspecified: Secondary | ICD-10-CM | POA: Insufficient documentation

## 2015-10-14 DIAGNOSIS — N189 Chronic kidney disease, unspecified: Secondary | ICD-10-CM | POA: Insufficient documentation

## 2015-10-14 DIAGNOSIS — Z79899 Other long term (current) drug therapy: Secondary | ICD-10-CM | POA: Diagnosis not present

## 2015-10-14 DIAGNOSIS — X58XXXA Exposure to other specified factors, initial encounter: Secondary | ICD-10-CM | POA: Insufficient documentation

## 2015-10-14 DIAGNOSIS — R45851 Suicidal ideations: Secondary | ICD-10-CM | POA: Diagnosis present

## 2015-10-14 DIAGNOSIS — R011 Cardiac murmur, unspecified: Secondary | ICD-10-CM | POA: Insufficient documentation

## 2015-10-14 DIAGNOSIS — I129 Hypertensive chronic kidney disease with stage 1 through stage 4 chronic kidney disease, or unspecified chronic kidney disease: Secondary | ICD-10-CM | POA: Diagnosis not present

## 2015-10-14 DIAGNOSIS — E785 Hyperlipidemia, unspecified: Secondary | ICD-10-CM | POA: Insufficient documentation

## 2015-10-14 DIAGNOSIS — Y998 Other external cause status: Secondary | ICD-10-CM | POA: Insufficient documentation

## 2015-10-14 DIAGNOSIS — T50902A Poisoning by unspecified drugs, medicaments and biological substances, intentional self-harm, initial encounter: Secondary | ICD-10-CM

## 2015-10-14 DIAGNOSIS — T43211A Poisoning by selective serotonin and norepinephrine reuptake inhibitors, accidental (unintentional), initial encounter: Secondary | ICD-10-CM | POA: Diagnosis not present

## 2015-10-14 DIAGNOSIS — T1491XA Suicide attempt, initial encounter: Secondary | ICD-10-CM | POA: Diagnosis present

## 2015-10-14 DIAGNOSIS — T43212A Poisoning by selective serotonin and norepinephrine reuptake inhibitors, intentional self-harm, initial encounter: Secondary | ICD-10-CM | POA: Diagnosis not present

## 2015-10-14 HISTORY — DX: Essential (primary) hypertension: I10

## 2015-10-14 HISTORY — DX: Hyperlipidemia, unspecified: E78.5

## 2015-10-14 HISTORY — DX: Paranoid schizophrenia: F20.0

## 2015-10-14 LAB — COMPREHENSIVE METABOLIC PANEL
ALT: 34 U/L (ref 14–54)
ANION GAP: 11 (ref 5–15)
AST: 26 U/L (ref 15–41)
Albumin: 3.8 g/dL (ref 3.5–5.0)
Alkaline Phosphatase: 102 U/L (ref 38–126)
BILIRUBIN TOTAL: 0.4 mg/dL (ref 0.3–1.2)
BUN: 8 mg/dL (ref 6–20)
CHLORIDE: 106 mmol/L (ref 101–111)
CO2: 19 mmol/L — ABNORMAL LOW (ref 22–32)
Calcium: 9.3 mg/dL (ref 8.9–10.3)
Creatinine, Ser: 0.92 mg/dL (ref 0.44–1.00)
GFR calc Af Amer: >60 mL/min (ref >60)
Glucose, Bld: 159 mg/dL — ABNORMAL HIGH (ref 65–99)
POTASSIUM: 4 mmol/L (ref 3.5–5.1)
Sodium: 136 mmol/L (ref 135–145)
TOTAL PROTEIN: 6.7 g/dL (ref 6.5–8.1)

## 2015-10-14 LAB — CBC
HCT: 41.7 % (ref 36.0–46.0)
Hemoglobin: 13.4 g/dL (ref 12.0–15.0)
MCH: 28 pg (ref 26.0–34.0)
MCHC: 32.1 g/dL (ref 30.0–36.0)
MCV: 87.2 fL (ref 78.0–100.0)
PLATELETS: 296 10*3/uL (ref 150–400)
RBC: 4.78 MIL/uL (ref 3.87–5.11)
RDW: 14 % (ref 11.5–15.5)
WBC: 8.6 10*3/uL (ref 4.0–10.5)

## 2015-10-14 LAB — I-STAT VENOUS BLOOD GAS, ED
ACID-BASE DEFICIT: 2 mmol/L (ref 0.0–2.0)
Bicarbonate: 20.9 mEq/L (ref 20.0–24.0)
O2 Saturation: 96 %
PCO2 VEN: 31.8 mmHg — AB (ref 45.0–50.0)
PO2 VEN: 76 mmHg — AB (ref 30.0–45.0)
TCO2: 22 mmol/L (ref 0–100)
pH, Ven: 7.427 — ABNORMAL HIGH (ref 7.250–7.300)

## 2015-10-14 LAB — URINALYSIS W MICROSCOPIC (NOT AT ARMC)
BILIRUBIN URINE: NEGATIVE
GLUCOSE, UA: NEGATIVE mg/dL
Hgb urine dipstick: NEGATIVE
Ketones, ur: NEGATIVE mg/dL
NITRITE: NEGATIVE
PH: 6 (ref 5.0–8.0)
Protein, ur: NEGATIVE mg/dL
SPECIFIC GRAVITY, URINE: 1.009 (ref 1.005–1.030)
Urobilinogen, UA: 0.2 mg/dL (ref 0.0–1.0)

## 2015-10-14 LAB — SALICYLATE LEVEL

## 2015-10-14 LAB — RAPID URINE DRUG SCREEN, HOSP PERFORMED
AMPHETAMINES: NOT DETECTED
BENZODIAZEPINES: NOT DETECTED
Barbiturates: NOT DETECTED
COCAINE: NOT DETECTED
OPIATES: NOT DETECTED
Tetrahydrocannabinol: NOT DETECTED

## 2015-10-14 LAB — I-STAT BETA HCG BLOOD, ED (MC, WL, AP ONLY): I-stat hCG, quantitative: <5 m[IU]/mL (ref <5)

## 2015-10-14 LAB — MAGNESIUM: MAGNESIUM: 1.8 mg/dL (ref 1.7–2.4)

## 2015-10-14 LAB — PHOSPHORUS: PHOSPHORUS: 2.6 mg/dL (ref 2.5–4.6)

## 2015-10-14 LAB — ACETAMINOPHEN LEVEL: Acetaminophen (Tylenol), Serum: <10 ug/mL — ABNORMAL LOW (ref 10–30)

## 2015-10-14 LAB — ETHANOL

## 2015-10-14 LAB — POC URINE PREG, ED: PREG TEST UR: NEGATIVE

## 2015-10-14 MED ORDER — SODIUM CHLORIDE 0.9 % IV BOLUS (SEPSIS): 1000.0000 mL | Freq: Once | INTRAVENOUS | Status: DC

## 2015-10-14 MED ORDER — LORAZEPAM 1 MG PO TABS
2.0000 mg | ORAL_TABLET | Freq: Once | ORAL | Status: DC
  Filled 2015-10-14: qty 2

## 2015-10-14 MED ORDER — SODIUM CHLORIDE 0.9 % IV BOLUS (SEPSIS)
1000.0000 mL | Freq: Once | INTRAVENOUS | Status: AC
  Administered 2015-10-14: 1000 mL via INTRAVENOUS

## 2015-10-14 MED ORDER — ONDANSETRON HCL 4 MG PO TABS: 4.0000 mg | ORAL_TABLET | Freq: Three times a day (TID) | ORAL | Status: DC | PRN

## 2015-10-14 MED ORDER — IBUPROFEN 400 MG PO TABS: 600.0000 mg | ORAL_TABLET | Freq: Three times a day (TID) | ORAL | Status: DC | PRN

## 2015-10-14 MED ORDER — LORAZEPAM 2 MG/ML IJ SOLN: 2.0000 mg | INTRAMUSCULAR | Status: DC | PRN

## 2015-10-14 MED ORDER — LORAZEPAM 2 MG/ML IJ SOLN
2.0000 mg | Freq: Once | INTRAMUSCULAR | Status: AC
  Administered 2015-10-14: 2 mg via INTRAVENOUS
  Filled 2015-10-14: qty 1

## 2015-10-14 NOTE — ED Notes (Signed)
Report given to [facility] from poison control of last assessment on the pt.

## 2015-10-14 NOTE — ED Provider Notes (Signed)
CSN: [id number]     Arrival date & time 10/14/15  1547 History   First MD Initiated Contact with Patient 10/14/15 1608     Chief Complaint  Patient presents with  . Suicidal  . Tachycardia     (Consider location/radiation/quality/duration/timing/severity/associated sxs/prior Treatment) HPI  44 year old female with history of schizoaffective disorder and prior suicide attempts who presents with suicide attempt and ingestion. Poor historian but states she took a handful of Trazodone earlier today. Unsure of what time. Denies co-ingestions. States "I just wanted to go to sleep." States "I just wanted to get away from [place]" Unable to clarify further. Told nurses maybe taken 10 tablets. Then states to me, "maybe 14, 16, maybe more, I don't remember:" Denies etOH or drug use. When asked if she did this to hurt herself, she says yes. Denies pain, nausea, vomiting, abdominal pain, chest pain, nausea, vomiting.  Past Medical History  Diagnosis Date  . Heart murmur     asa child   . Anemia     hx of   . Depression   . Anxiety   . Anemia   . Chronic kidney disease     kidney stone   . Chronic kidney disease   . Renal insufficiency   . Blood dyscrasia   . Iron deficiency anemia, unspecified 11/15/2013  . Iron deficiency anemia, unspecified 11/15/2013  . Hypertension   . Diabetes mellitus without complication ([place])   . Hyperlipidemia   . Paranoid schizophrenia [facility])    Past Surgical History  Procedure Laterality Date  . Other surgical history      cyst removed from ovary ? side   . Nephrolithotomy  03/31/2012    Procedure: NEPHROLITHOTOMY PERCUTANEOUS;  Surgeon: Claybon Jabs, MD;  [facility];  Service: Urology;  Laterality: Left;  . Tubal ligation    . Laparotomy  07/31/2012    Procedure: EXPLORATORY LAPAROTOMY;  Surgeon: Harl Bowie, MD;  Location: [place];  Service: General;  Laterality: N/A;  REPAIR OF PANCREATIC INJURY, EXPLORATION OF RETROPERITONEUM.  [name] Colostomy   07/31/2012    Procedure: COLOSTOMY;  Surgeon: Harl Bowie, MD;  Location: [place];  Service: General;  Laterality: Right;  . Colostomy reversal    . G[place]  2013    ABDOMINAL SURGERY  . Colostomy closure N/A 02/15/2013    Procedure: COLOSTOMY CLOSURE;  Surgeon: Gwenyth Ober, MD;  Location: [place];  Service: General;  Laterality: N/A;  Reversal of colostomy  . Dilatation & curettage/hysteroscopy with trueclear N/A 10/24/2013    Procedure: DILATATION & CURETTAGE/HYSTEROSCOPY WITH TRUECLEAR, CERVICAL BLOCK;  Surgeon: Marylynn Pearson, MD;  Location: [place] [facility];  Service: Gynecology;  Laterality: N/A;  . Breast reduction  06/2014   Family History  Problem Relation Age of Onset  . Cancer Mother     kidney  . Depression Mother   . Other Neg Hx     adrenal problem   Social History  Substance Use Topics  . Smoking status: Never Smoker   . Smokeless tobacco: Never Used  . Alcohol Use: No   OB History    Gravida Para Term Preterm AB TAB SAB Ectopic Multiple Living   3 2 2  1  1   2      Review of Systems  Unable to perform ROS: Mental status change      Allergies  Enoxaparin; Augmentin; Avelox; and Sodium hydroxide  Home Medications   Prior to Admission medications   Medication Sig Start  Date End Date Taking? Authorizing Provider  cloZAPine (CLOZARIL) 50 MG tablet Take 1 tablet (50 mg total) by mouth at bedtime. For mood control 09/18/15  Yes Encarnacion Slates, NP  FLUoxetine (PROZAC) 40 MG capsule Take 1 capsule (40 mg total) by mouth daily. For depression 09/18/15  Yes Encarnacion Slates, NP  hydrOXYzine (ATARAX/VISTARIL) 25 MG tablet Take 1 tablet (25 mg total) by mouth every 6 (six) hours as needed for anxiety. 09/18/15  Yes Encarnacion Slates, NP  ibuprofen (ADVIL,MOTRIN) 200 MG tablet Take 200 mg by mouth every 6 (six) hours as needed (pain).   Yes Historical Provider, MD  metFORMIN (GLUCOPHAGE) 500 MG tablet Take 1 tablet (500 mg total) by mouth daily with breakfast. For diabetes management  09/18/15  Yes Encarnacion Slates, NP  paliperidone (INVEGA SUSTENNA) 156 MG/ML SUSP injection Inject 1 mL (156 mg total) into the muscle every 28 (twenty-eight) days. (Last dose given on 09-10-15): For mood control 09/18/15  Yes Encarnacion Slates, NP  traZODone (DESYREL) 50 MG tablet Take 1 tablet (50 mg total) by mouth at bedtime as needed for sleep. 09/18/15  Yes Encarnacion Slates, NP  ARIPiprazole (ABILIFY) 5 MG tablet Take 1 tablet (5 mg total) by mouth every evening. For mood control Patient not taking: Reported on 10/14/2015 09/18/15   Encarnacion Slates, NP  benztropine (COGENTIN) 1 MG tablet Take 1 tablet (1 mg total) by mouth every evening. For prevention of EPS Patient not taking: Reported on 10/14/2015 09/18/15   Encarnacion Slates, NP  LORazepam (ATIVAN) 1 MG tablet Take 1 tablet (1 mg total) by mouth every 6 (six) hours as needed (severe anxiety/agitation). Patient not taking: Reported on 10/14/2015 09/18/15   Encarnacion Slates, NP  omeprazole (PRILOSEC) 40 MG capsule Take 1 capsule (40 mg total) by mouth daily. For acid reflux Patient not taking: Reported on 10/14/2015 09/18/15   Encarnacion Slates, NP  propranolol (INDERAL) 10 MG tablet Take 1 tablet (10 mg total) by mouth 2 (two) times daily. For anxiety Patient not taking: Reported on 10/14/2015 09/18/15   Encarnacion Slates, NP  simvastatin (ZOCOR) 20 MG tablet Take 1 tablet (20 mg total) by mouth at bedtime. For high cholesterol Patient not taking: Reported on 10/14/2015 09/18/15   Encarnacion Slates, NP   BP 127/91 mmHg  Pulse 88  Temp(Src) 98.1 F (36.7 C) (Oral)  Resp 18  SpO2 98% Physical Exam Physical Exam  Nursing note and vitals reviewed. Constitutional: Well developed, well nourished, non-toxic in appearance, somnolent appearing Head: Normocephalic and atraumatic.  Mouth/Throat: Oropharynx is clear. Mucous membranes appear dry.  Neck: Normal range of motion. Neck supple.  Cardiovascular: Tachycardic rate and regular rhythm.  No edema. Pulmonary/Chest: Effort  normal and breath sounds normal.  Abdominal: Soft. There is no tenderness. There is no rebound and no guarding.  Musculoskeletal: Normal range of motion.  Neurological: Alert, no facial droop, slurring of speech, moves all extremities symmetrically, sensation to light touch in tact throughout. No clonus. Oriented to name and place, but not time.  Skin: Skin is warm and dry.  Psychiatric: Cooperative  ED Course  Procedures (including critical care time) Labs Review Labs Reviewed  COMPREHENSIVE METABOLIC PANEL - Abnormal; Notable for the following:    CO2 19 (*)    Glucose, Bld 159 (*)    All other components within normal limits  ACETAMINOPHEN LEVEL - Abnormal; Notable for the following:    Acetaminophen (Tylenol), Serum <10 (*)  All other components within normal limits  URINALYSIS W MICROSCOPIC - Abnormal; Notable for the following:    APPearance CLOUDY (*)    Leukocytes, UA SMALL (*)    All other components within normal limits  I-STAT VENOUS BLOOD GAS, ED - Abnormal; Notable for the following:    pH, Ven 7.427 (*)    pCO2, Ven 31.8 (*)    pO2, Ven 76.0 (*)    All other components within normal limits  ETHANOL  SALICYLATE LEVEL  CBC  URINE RAPID DRUG SCREEN, HOSP PERFORMED  MAGNESIUM  PHOSPHORUS  BLOOD GAS, VENOUS  POC URINE PREG, ED  I-STAT BETA HCG BLOOD, ED ([facility], [facility], [facility] ONLY)    Imaging Review No results found. I have personally reviewed and evaluated these images and lab results as part of my medical decision-making.   EKG Interpretation   Date/Time:  Tuesday October 14 2015 15:57:32 EDT Ventricular Rate:  154 PR Interval:  104 QRS Duration: 82 QT Interval:  326 QTC Calculation: 522 R Axis:   71 Text Interpretation:  Sinus tachycardia with short PR Otherwise normal ECG  QT prolongation, new since prior Confirmed by Jacqueline Spofford MD, Voula Waln ([id number]) on  10/14/2015 4:35:52 PM      MDM   Final diagnoses:  Drug overdose, intentional, initial encounter ([place])   Suicide attempt ([place])    44 year old female with history of prior suicide attempt and schizoaffective disorder who presents after suicide attempt by trazodone overdose. Unclear if the number of pills but up to about 20 tablets of 50 mg of trazodone per patient. She is initially tachycardic in the 150s, but rate improved with IV fluids. Initial EKG also showed QT prolongation, but when EKG is repeated when rate is slower, no evidence of QT prolongation and no QRS widening or other acute abnormalities. Tylenol, salicylate, and ethanol levels are normal. No major electrolyte or metabolic derangements noted. Expect patient to be somnolent for quite some time , and will be observed until clinically sober and appropriate for psychiatric evaluation. TTS consultation  Pending and patient will be placed in psych holding for cardiac monitoring and further observation.   Forde Dandy, MD 10/15/15 [phone]
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
## 2015-10-14 NOTE — BH Assessment (Signed)
[name] [name] Assessment Progress Note   Called to attempt to schedule pt's tele assessment with the pt as well as gathered clinical information from [name] [name].  She recommended pt be evaluated when she is able to participate in assessment.  The TTS consult was not taken out.  [facility] will call when pt can be assessed per EDP Liu.  Shaune Pascal, MS, [facility] Therapeutic Triage Specialist [facility]

## 2015-10-14 NOTE — ED Notes (Addendum)
Spoke with [name] Gauss at [employer] who recommended no charcoal at this time due to unknown ingestion time, an EKG and cardiac monitoring, acetaminophen level, salicylate level, and liver enzymes.  Monitor for bradycardia and expect drowsiness.  Will call to follow up at a later time today.

## 2015-10-14 NOTE — ED Notes (Signed)
Staffing contacted for sitter. Informed that they will most likely not be able to provide one until 7pm.

## 2015-10-14 NOTE — ED Notes (Signed)
Pt here for SI since last night/ States "I would take too much medication." Pt reports taking Trazodone to "try and make me fall asleep." Pt states "I only took two. I think." Noted to have HR in 150's. Denies ETOH/drug abuse.

## 2015-10-15 DIAGNOSIS — T1491 Suicide attempt: Secondary | ICD-10-CM | POA: Diagnosis not present

## 2015-10-15 LAB — CBG MONITORING, ED: GLUCOSE-CAPILLARY: 100 mg/dL — AB (ref 65–99)

## 2015-10-15 MED ORDER — PANTOPRAZOLE SODIUM 40 MG PO TBEC
40.0000 mg | DELAYED_RELEASE_TABLET | Freq: Every day | ORAL | Status: DC
  Administered 2015-10-15 – 2015-10-20 (×6): 40 mg via ORAL
  Filled 2015-10-15 (×6): qty 1

## 2015-10-15 MED ORDER — SIMVASTATIN 20 MG PO TABS: 20.0000 mg | ORAL_TABLET | Freq: Every day | ORAL | Status: DC

## 2015-10-15 MED ORDER — IBUPROFEN 200 MG PO TABS
200.0000 mg | ORAL_TABLET | Freq: Four times a day (QID) | ORAL | Status: DC | PRN
  Administered 2015-10-16: 200 mg via ORAL
  Filled 2015-10-15: qty 1

## 2015-10-15 MED ORDER — TRAZODONE HCL 50 MG PO TABS
50.0000 mg | ORAL_TABLET | Freq: Every day | ORAL | Status: DC
  Administered 2015-10-15 – 2015-10-19 (×5): 50 mg via ORAL
  Filled 2015-10-15 (×5): qty 1

## 2015-10-15 MED ORDER — TRAZODONE HCL 50 MG PO TABS: 50.0000 mg | ORAL_TABLET | Freq: Every evening | ORAL | Status: DC | PRN

## 2015-10-15 MED ORDER — LOPERAMIDE HCL 2 MG PO CAPS
4.0000 mg | ORAL_CAPSULE | ORAL | Status: DC | PRN
  Administered 2015-10-15: 4 mg via ORAL
  Filled 2015-10-15: qty 2

## 2015-10-15 MED ORDER — METFORMIN HCL 500 MG PO TABS
500.0000 mg | ORAL_TABLET | Freq: Every day | ORAL | Status: DC
  Administered 2015-10-16: 500 mg via ORAL
  Filled 2015-10-15 (×2): qty 1

## 2015-10-15 MED ORDER — FLUOXETINE HCL 20 MG PO CAPS
40.0000 mg | ORAL_CAPSULE | Freq: Every day | ORAL | Status: DC
  Administered 2015-10-15 – 2015-10-20 (×6): 40 mg via ORAL
  Filled 2015-10-15 (×7): qty 2

## 2015-10-15 MED ORDER — LORAZEPAM 1 MG PO TABS
1.0000 mg | ORAL_TABLET | Freq: Four times a day (QID) | ORAL | Status: DC | PRN
  Administered 2015-10-15: 1 mg via ORAL
  Filled 2015-10-15: qty 1

## 2015-10-15 MED ORDER — HYDROXYZINE HCL 25 MG PO TABS
50.0000 mg | ORAL_TABLET | Freq: Once | ORAL | Status: AC
  Administered 2015-10-15: 50 mg via ORAL
  Filled 2015-10-15: qty 2

## 2015-10-15 MED ORDER — CLOZAPINE 25 MG PO TABS: 50.0000 mg | ORAL_TABLET | Freq: Every day | ORAL | Status: DC

## 2015-10-15 MED ORDER — BENZTROPINE MESYLATE 1 MG PO TABS
1.0000 mg | ORAL_TABLET | Freq: Every evening | ORAL | Status: DC
  Filled 2015-10-15: qty 1

## 2015-10-15 MED ORDER — CLOZAPINE 25 MG PO TABS
25.0000 mg | ORAL_TABLET | Freq: Every day | ORAL | Status: DC
  Administered 2015-10-15 – 2015-10-19 (×4): 25 mg via ORAL
  Filled 2015-10-15 (×7): qty 1

## 2015-10-15 MED ORDER — ARIPIPRAZOLE 10 MG PO TABS
5.0000 mg | ORAL_TABLET | Freq: Every evening | ORAL | Status: DC
  Filled 2015-10-15: qty 1

## 2015-10-15 MED ORDER — PROPRANOLOL HCL 10 MG PO TABS: 10.0000 mg | ORAL_TABLET | Freq: Two times a day (BID) | ORAL | Status: DC

## 2015-10-15 MED ORDER — HYDROXYZINE HCL 25 MG PO TABS
25.0000 mg | ORAL_TABLET | Freq: Four times a day (QID) | ORAL | Status: DC | PRN
  Administered 2015-10-17 – 2015-10-19 (×4): 25 mg via ORAL
  Filled 2015-10-15 (×4): qty 1

## 2015-10-15 MED ORDER — PALIPERIDONE PALMITATE 156 MG/ML IM SUSP: 156.0000 mg | INTRAMUSCULAR | Status: DC

## 2015-10-15 NOTE — ED Notes (Signed)
Pt taking shower.  

## 2015-10-15 NOTE — ED Notes (Signed)
Pt requesting medication to help her sleep, admin ativan from PRN meds.

## 2015-10-15 NOTE — ED Notes (Signed)
Family at the bedside.

## 2015-10-15 NOTE — ED Notes (Signed)
Patient was given a snack and drink. Regular diet was ordered for lunch.

## 2015-10-15 NOTE — ED Notes (Signed)
Pt walking laps.

## 2015-10-15 NOTE — ED Notes (Signed)
MD Wentz at the bedside  

## 2015-10-15 NOTE — ED Notes (Signed)
Pt complaining of diarrhea. Denies any abdominal pain. Stated it started this morning.

## 2015-10-15 NOTE — ED Notes (Signed)
Pt given snack by EMT.

## 2015-10-15 NOTE — ED Notes (Signed)
Attempted to enter room to introduce myself to pt.  Pt states "No men allowed in her room". Advised pt to let this tech know if she needs anything.
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
## 2015-10-15 NOTE — ED Notes (Signed)
Pt reports a tracker in her pocket, actually a CBG strip. Pt reports she needs to figure out how to turn in off.

## 2015-10-15 NOTE — Progress Notes (Signed)
Seeking inpatient psychiatric placement for pt. Also considered for admission to [facility] upon bed availability.  Referred to: [facility]- per Roderic Ovens- per [facility]- per [facility]- per Ethelda Chick- per Chipper Herb, MSW, LCSW Clinical Social Work, Disposition  10/15/2015 [phone]

## 2015-10-15 NOTE — ED Notes (Signed)
Pt ambulating in the hall experiencing visual hallucinations, making repetitive motions as if picking bugs out of the air and putting them in her pocket.

## 2015-10-15 NOTE — ED Notes (Signed)
TTS at the bedside. 

## 2015-10-15 NOTE — Progress Notes (Signed)
Referral was followed up at: [facility]- per Missy, re-fax to [phone], the old number is not working. [place]- referral not received.  [facility]- per Mardene Celeste, call back in am at [place] and "My RN will call you to let you know if we've got that referral."  Declined at: [facility]- per Sonia Baller, due to tachycardia  At capacity: [facility]  CSW will continue to seek placement.  Verlon Setting, [place] Disposition staff 10/15/2015 11:08 PM

## 2015-10-15 NOTE — ED Notes (Signed)
[place] for Assessment to be complete.

## 2015-10-15 NOTE — ED Provider Notes (Signed)
Alert, anxious.  She states, I don't want any men in my room.  Pending admission to a [facility]  Daleen Bo, MD 10/15/15 [phone]

## 2015-10-15 NOTE — ED Notes (Signed)
Patient using phone at this time.

## 2015-10-16 DIAGNOSIS — T1491 Suicide attempt: Secondary | ICD-10-CM | POA: Diagnosis not present

## 2015-10-16 NOTE — Progress Notes (Addendum)
Continuing to seek inpatient psych placement.  Referred to: [place]- per [facility]- per Gershon Mussel (referral fax has not been submitted successfully after multiple attempts- will continue faxing) [facility]- per [facility] pt was declined yesterday due to tachycardia, however today, per Ronalee Belts, will reconsider as pt's HR is within normal range  [place], [place], [facility], [place], [place], [place], [facility], [place] at capacity.  Sharren Bridge, MSW, LCSW Clinical Social Work, Disposition  10/16/2015 [phone]

## 2015-10-16 NOTE — ED Notes (Signed)
Pt requested recliner to sit in, recliner moved into room by this RN and pt is sitting in the recliner now.

## 2015-10-16 NOTE — Progress Notes (Addendum)
Referral followed up at: [facility] - left voicemail. [place] - referral not received. Referral was re-faxed. [facility] - per [facility] [place], referral received and to be reviewed. [place] - per Manuela Schwartz, d/cs tomorrow on 10/21, fax it. [facility] - per Conley Rolls, adult female bed open, fax referral. [facility] - per Shauna Hugh, fax referral for review. Per RN Nira Conn, will call back about referral.  Declined at: [facility] - per Erline Levine, due medical  CSW will continue to seek placement.  Verlon Setting, [place] Disposition staff 10/16/2015 8:02 PM

## 2015-10-16 NOTE — ED Notes (Signed)
Pt walking with sitter around the unit. Pt is calm.

## 2015-10-17 DIAGNOSIS — T1491 Suicide attempt: Secondary | ICD-10-CM | POA: Diagnosis not present

## 2015-10-17 NOTE — ED Notes (Addendum)
Pt ambulating in the hallway with sitter. Pt requesting cereal, service response called and sending cereal

## 2015-10-17 NOTE — ED Notes (Signed)
Lunch tray ordered 

## 2015-10-17 NOTE — ED Notes (Signed)
PT taking shower.

## 2015-10-17 NOTE — ED Notes (Signed)
Pt requested things to take a shower, pt bin prepared for shower, pt walking laps at this time and will take shower when she's ready.

## 2015-10-17 NOTE — ED Notes (Signed)
Pt taking a shower 

## 2015-10-17 NOTE — Progress Notes (Signed)
Followed up on inpatient placement efforts.    Referred to: [facility]- per Kerry Dory [facility]- per Lillia Abed- per Estill Bamberg (fax not sent succesfully after multiple attempts over past 2 days- will keep attempting) [facility]- authorization [phone] provided by [facility] cln Tonya. Completed regional referral form and faxed to [facility]. Spoke with Judeen Hammans with intake to provide demographics. Judeen Hammans states referral will be reviewed and intake will be in touch.  At capacity for appropriate unit: [place]: [facility]  Both due to medical acuity  Sharren Bridge, MSW, LCSW Clinical Social Work, Disposition  10/17/2015 [phone]

## 2015-10-17 NOTE — ED Notes (Signed)
Patient ate dinner

## 2015-10-17 NOTE — ED Notes (Signed)
AMY from patient's ACT called for information on patient. Advised she could speak with patient to get further information. Patient advised ACT team [place] is still looking for placement. She also advised she has a surgery scheduled for Nov. 9 and would need to be out by that time.

## 2015-10-17 NOTE — ED Notes (Signed)
Sandwich provided as patient requested.

## 2015-10-17 NOTE — ED Notes (Signed)
PT ambulating in hall with trained sitter.

## 2015-10-17 NOTE — ED Notes (Addendum)
PT refuses Metformin because she only takes it because clozapine raises her blood sugar. Pt states " I am being weaned off the clozapine and I am taking such a low dose I do not need it." Pt also refused CBG check.

## 2015-10-17 NOTE — ED Notes (Signed)
Pt states she is feeling anxious and needs vistaril.

## 2015-10-17 NOTE — ED Notes (Signed)
Lunch delivered. 

## 2015-10-17 NOTE — ED Notes (Signed)
Pt refusing blood sugar check, pt states "i'm not a diabetic". RN Luellen Pucker aware.

## 2015-10-17 NOTE — ED Notes (Addendum)
Pt walking laps with sitter

## 2015-10-18 DIAGNOSIS — T1491 Suicide attempt: Secondary | ICD-10-CM | POA: Diagnosis not present

## 2015-10-18 NOTE — ED Notes (Signed)
Pt requested a coke with her dinner tray and pt is given the same

## 2015-10-18 NOTE — ED Notes (Signed)
Frankey Poot, SW, [facility], aware pt needs TTS - has not been seen since 10/15/15.

## 2015-10-18 NOTE — ED Notes (Signed)
Pt on phone at nurses' desk. 

## 2015-10-18 NOTE — BH Assessment (Addendum)
[place] Assessment Progress Note  CSW spoke with pt at 3:20PM on 10/18/15 for re-assessment. Pt denies SI/HI/AVH. Presents with pleasant mood/calm affect and presents as clear, oriented, and with organized thought process. Pt reports that her sister can take her home if she is able to d/c today. She lives with her mother and plans to return there. Pt reports that she has an ACT team through [facility] in [place].  Pt run by [name] NP, who made the decision that pt will not be able to discharge at this time due to extensive history of suicide attempts including her recent overdose on Trazodone. CSW requested that pt's RN Eber Hong) fax consent to [id number] CSW to speak with her ACT team in order to request for them to visit pt and complete assessment while she is in the hospital (per Debarah Crape).   Maxie Better, LCSWA Clinical Social Worker 10/18/2015 3:42 PM   CSW spoke with [facility] on pt's ACT team [phone]) who stated that they are not licensed to assess pt while she is hospitalized or assist with making decision as to whether pt is stable to discharge. [place] will forward message and information to her supervisor and will make contact with pt's RN on Monday during their normal business hours. Eber Hong RN notified of above.  Maxie Better, LCSWA Clinical Social Worker 10/18/2015 3:56 PM

## 2015-10-18 NOTE — ED Notes (Signed)
Frankey Poot, SW, [facility], advised he will notify x 3 of pt needing TTS.

## 2015-10-18 NOTE — ED Notes (Signed)
Snack and drink given to patient, and regular diet order taken for dinner.

## 2015-10-18 NOTE — ED Notes (Signed)
Vistaril given to pt as requested. Pt sitting in chair in room - aware of delay w/TTS.

## 2015-10-18 NOTE — ED Notes (Signed)
[place], [facility], called and advised pt's ACT team is unable to come to ED to assess pt - advised they will follow up w/her on Monday (10/20/15). [place] not comfortable at this time d/c'ing home pt d/t nature of past SI attempts and recent attempt. Pt aware of tx plan - will stay in ED until placement and ACT team will follow up on Monday. Voiced understanding. Pt called her sister back to advise she may come visit at 1730 visitation time.

## 2015-10-18 NOTE — ED Notes (Signed)
Sitting in chair in room waiting for telepsych. Pt asking if he may sign herself out d/t no longer feels SI - advised pt of process - voiced understanding. Pt remains calm, cooperative, pleasant.

## 2015-10-18 NOTE — ED Notes (Signed)
TTS being performed by Nira Conn, [facility].

## 2015-10-18 NOTE — ED Notes (Signed)
Pt began ambulating in hallway - advised pt of [facility] policy - voiced understanding and returned to room w/sitter. Pt aware telepsych to be performed soon - machine in room.

## 2015-10-18 NOTE — ED Notes (Signed)
Snack and drink given at snack time. Regular diet order taken for lunch.

## 2015-10-18 NOTE — ED Notes (Signed)
Pt at nurses' desk calling her sister to ensure she will be able to pick her up if d/c'd to home. Pt aware [place], [facility], called and advised [facility] psych recommended for [facility] to contact pt's ACT team member so may assess pt while in ED prior to verifying may be d/c'd. Pt signed consent form - faxed to [facility]. Copy sent to medical records.

## 2015-10-18 NOTE — ED Notes (Signed)
Snack provided as patient requested.

## 2015-10-19 DIAGNOSIS — T1491 Suicide attempt: Secondary | ICD-10-CM | POA: Diagnosis not present

## 2015-10-19 NOTE — ED Notes (Signed)
Pt on phone at nurses' desk - attempted to place call - no answer.

## 2015-10-19 NOTE — ED Notes (Signed)
TTS being performed.  

## 2015-10-19 NOTE — ED Notes (Signed)
Pt called ACT team member and left message for him/her to come to ED to assess her tomorrow as requested by [facility].

## 2015-10-19 NOTE — ED Notes (Signed)
Pt is requesting a female sitter at 78. Staffing notified.

## 2015-10-19 NOTE — ED Notes (Signed)
Pt requested a happy meal with [place] sandwich and apple sauce w/coke to drink. Pt given the same.

## 2015-10-19 NOTE — ED Notes (Signed)
Pt requested a happy meal ([place] sandwich and apple sauce) with a ginger ale for a snack. Pt was given the same.

## 2015-10-19 NOTE — BH Assessment (Addendum)
[place] Assessment Progress Note  Pt seen this day via tele assessment for reassessment.  Pt denies SI/HI/AVH at this time.  Pt stated she is ready to go home to prepare for her upcoming surgeries.  Per CSW note from yesterday, pt's ACT Team is going to call pt's RN tomorrow on shift in AM.  Pt's ACT Team - [phone].  Pt to remain in ED per [name]' recommendation yesterday die to pt's extensive hx of suicide attempts.  Pt to be reassessed tomorrow.  Pt calm, pleasant, oriented x 4, had logical/coherent thought processes, depressed mood, appropriate affect, good eye contact, normal speech.  TTS to continue to seek placement for the pt. Pt is pending at several facilities.  Shaune Pascal, MS, [facility] Therapeutic Triage Specialist [facility]

## 2015-10-19 NOTE — ED Notes (Signed)
Patient was given a snack and drink, Regular diet order taken for lunch.

## 2015-10-19 NOTE — ED Notes (Addendum)
States had last Invega injection in Oct 2016 - rather than 09/10/15 as entered in home meds. States gets med filled at [facility] and ACT administers it.

## 2015-10-19 NOTE — ED Notes (Signed)
Pt at desk making a phone call.  

## 2015-10-20 DIAGNOSIS — T1491 Suicide attempt: Secondary | ICD-10-CM | POA: Diagnosis not present

## 2015-10-20 DIAGNOSIS — T43212A Poisoning by selective serotonin and norepinephrine reuptake inhibitors, intentional self-harm, initial encounter: Secondary | ICD-10-CM

## 2015-10-20 DIAGNOSIS — T50902A Poisoning by unspecified drugs, medicaments and biological substances, intentional self-harm, initial encounter: Secondary | ICD-10-CM | POA: Insufficient documentation

## 2015-10-20 NOTE — Discharge Instructions (Signed)
Suicidal Feelings: How to Help Yourself °Suicide is the taking of one's own life. If you feel as though life is getting too tough to handle and are thinking about suicide, get help right away. To get help: °· Call your local emergency services (911 in the [place] °· Call a suicide hotline to speak with a trained counselor who understands how you are feeling. The following is a list of suicide hotlines in the [place]. For a list of hotlines in [place], visit www.suicide.org/hotlines/international/[place]-suicide-hotlines.html. °¨  1-800-273-TALK (1-800-273-8255). °¨  1-800-SUICIDE (1-800-784-2433). °¨  1-888-628-9454. This is a hotline for Spanish speakers. °¨  1-800-799-4TTY (1-800-799-4889). This is a hotline for TTY users. °¨  1-866-4-U-TREVOR (1-866-488-7386). This is a hotline for lesbian, gay, bisexual, transgender, or questioning youth. °· Contact a crisis center or a local suicide prevention center. To find a crisis center or suicide prevention center: °¨ Call your local hospital, clinic, community service organization, mental health center, social service provider, or health department. Ask for assistance in connecting to a crisis center. °¨ Visit www.suicidepreventionlifeline.org/getinvolved/locator for a list of crisis centers in the [place], or visit www.suicideprevention.[place]/thinking-about-suicide/find-a-crisis-centre for a list of centers in [place]. °· Visit the following websites: °¨  National Suicide Prevention Lifeline: www.suicidepreventionlifeline.org °¨  Hopeline: www.hopeline.com °¨  American Foundation for Suicide Prevention: www.afsp.org °¨  The Trevor Project (for lesbian, gay, bisexual, transgender, or questioning youth): www.thetrevorproject.org °HOW CAN I HELP MYSELF FEEL BETTER? °· Promise yourself that you will not do anything drastic when you have suicidal feelings. Remember, there is hope. Many people have gotten through suicidal thoughts and feelings, and you will, too. You may  have gotten through them before, and this proves that you can get through them again. °· Let family, friends, teachers, or counselors know how you are feeling. Try not to isolate yourself from those who care about you. Remember, they will want to help you. Talk with someone every day, even if you do not feel sociable. Face-to-face conversation is best. °· Call a mental health professional and see one regularly. °· Visit your primary health care provider every year. °· Eat a well-balanced diet, and space your meals so you eat regularly. °· Get plenty of rest. °· Avoid alcohol and drugs, and remove them from your home. They will only make you feel worse. °· If you are thinking of taking a lot of medicine, give your medicine to someone who can give it to you one day at a time. If you are on antidepressants and are concerned you will overdose, let your health care provider know so he or she can give you safer medicines. Ask your mental health professional about the possible side effects of any medicines you are taking. °· Remove weapons, poisons, knives, and anything else that could harm you from your home. °· Try to stick to routines. Follow a schedule every day. Put self-care on your schedule. °· Make a list of realistic goals, and cross them off when you achieve them. Accomplishments give a sense of worth. °· Wait until you are feeling better before doing the things you find difficult or unpleasant. °· Exercise if you are able. You will feel better if you exercise for even a half hour each day. °· Go out in the sun or into nature. This will help you recover from depression faster. If you have a favorite place to walk, go there. °· Do the things that have always given you pleasure. Play your favorite music, read a good book, paint a picture, play your favorite instrument, or do anything   else that takes your mind off your depression if it is safe to do. °· Keep your living space well lit. °· When you are feeling well,  write yourself a letter about tips and support that you can read when you are not feeling well. °· Remember that life's difficulties can be sorted out with help. Conditions can be treated. You can work on thoughts and strategies that serve you well. °  °This information is not intended to replace advice given to you by your health care provider. Make sure you discuss any questions you have with your health care provider. °  °Document Released: 06/19/2003 Document Revised: 01/03/2015 Document Reviewed: 04/09/2014 °Elsevier Interactive Patient Education ©2016 Elsevier Inc. ° °

## 2015-10-20 NOTE — ED Notes (Signed)
Pt is sleeping at this time.

## 2015-10-20 NOTE — Progress Notes (Signed)
Spoke with Robin[place] at [facility] who states pt is on waiting list for admission but would need to be IVC'd to be considered for admission when bed comes available.   Spoke with representative from pt's ACTT on crisis line [phone]) who states they discussed pt with ACTT psychiatrist this morning. States they will not be coming to ED to assess pt as their protocol is "for the hospital to decide if a patient is stable enough for d/c, and we follow up with them at their homes right after they are d/c'd." Asks that, rather than crisis line, use office number [phone] to follow up re: pt's care. States her clinician is Amy, and Investment banker, operational is [place].   Sharren Bridge, MSW, LCSW Clinical Social Work, Disposition  10/20/2015 [phone]

## 2015-10-20 NOTE — Consult Note (Signed)
Telepsych Consultation   Reason for Consult:  Discharge Disposition Referring Physician: Zacarias [name] EDP Patient Identification: [name] [name] MRN:  [id number] Principal Diagnosis: Suicide attempt [facility]) Diagnosis:   Patient Active Problem List   Diagnosis Date Noted  . Drug overdose, intentional ([place]) [T50.902A]   . Hyperprolactinemia ([place]) [E22.1] 09/11/2015  . Schizoaffective disorder, bipolar type ([place]) [F25.0] 09/10/2015  . Hx of borderline personality disorder [Z86.59] 09/10/2015  . PTSD (post-traumatic stress disorder) [F43.10] 09/10/2015  . Suicide attempt ([place]) [T14.91] 09/05/2015  . Overdose, drug [T50.901A] 09/05/2015  . Overdose [T50.901A] 09/05/2015  . Drug overdose [T50.901A]   . QT prolongation [I45.81]   . Weight gain [R63.5] 03/06/2015  . [place] [R23.2] 03/06/2015  . Iron deficiency anemia, unspecified [D50.9] 11/15/2013  . Liver mass, left lobe [R16.0] 05/28/2013  . Postop check [Z09] 03/13/2013  . Postoperative wound infection [T81.4XXA] 02/23/2013  . History of colostomy [Z98.890] 02/16/2013  . Borderline personality disorder [F60.3] 08/16/2012    Class: Chronic  . Acute blood loss anemia [D62] 08/01/2012  . Depression, major ([place]) [F32.9] 08/01/2012  . Renal calculus, left [N20.0] 03/31/2012  . Hydronephrosis, left [N13.30] 03/31/2012    Total Time spent with patient: 30 minutes  Subjective:   [name] [name] is a 44 y.o. female patient admitted with overdose of Trazodone.   HPI:   [name] [name] is a 44 year old female who presented to [facility] after reporting taking an excessive amount of Trazodone. The patient has a history of several suicide attempts and was recently inpatient at [facility] in September. She has been in the [facility] due to concern over her safety. Her ACT team from [place] was asked to come see her but reported they could not due to her being in the hospital. Patient has been in the ED since last Tuesday. [name] [name] appears calm during  assessment today stating "I am doing well. I was just trying to get some sleep. Did not want to hurt myself. I understand people's concern though. I have had trouble coping with my divorce. My ACT team is trying to help me process my emotions better. My therapist is actually training in [place] and I am hopeful this will help me. I do not want to hurt myself today. I am feeling good and need to address an upcoming surgery for a hernia. I have so much to do but can't because I have been in here. I am not suicidal or homicidal. I am not hearing any voices today. I am thinking clearly." During the assessment the patient appeared calm with linear thought processes. She does not currently appear to be a danger to herself but does have a long history of self injurious behaviors including an attempt at hanging. The patient appears future oriented and reports having her daughter to live for "It's hard that she will not speak to me but I know eventually she will return to me." According to notes in epic the patient has consistently denied any active suicidal intent and her behavior has been appropriate. She will be followed closely with her ACT team and appears motivated for treatment. Discussed case with Dr. Dwyane Dee during morning rounds who felt that if patient was not suicidal today could return home with the ACT team in place.   HPI Elements:   Location:  Depression, suicide attempt via overdose . Quality:  Unable to cope positively with stressors . Severity:  Severe. Timing:  Last few weeks. Duration:  Chronic with long history of suicide attempts . Context:  Reports stress from recent divorce, conflict with teenage daughter .  Past Medical History:  Past Medical History  Diagnosis Date  . Heart murmur     asa child   . Anemia     hx of   . Depression   . Anxiety   . Anemia   . Chronic kidney disease     kidney stone   . Chronic kidney disease   . Renal insufficiency   . Blood dyscrasia   . Iron  deficiency anemia, unspecified 11/15/2013  . Iron deficiency anemia, unspecified 11/15/2013  . Hypertension   . Diabetes mellitus without complication ([place])   . Hyperlipidemia   . Paranoid schizophrenia [facility])     Past Surgical History  Procedure Laterality Date  . Other surgical history      cyst removed from ovary ? side   . Nephrolithotomy  03/31/2012    Procedure: NEPHROLITHOTOMY PERCUTANEOUS;  Surgeon: Claybon Jabs, MD;  Location: [facility];  Service: Urology;  Laterality: Left;  . Tubal ligation    . Laparotomy  07/31/2012    Procedure: EXPLORATORY LAPAROTOMY;  Surgeon: Harl Bowie, MD;  Location: [place];  Service: General;  Laterality: N/A;  REPAIR OF PANCREATIC INJURY, EXPLORATION OF RETROPERITONEUM.  [name] Colostomy  07/31/2012    Procedure: COLOSTOMY;  Surgeon: Harl Bowie, MD;  Location: [place];  Service: General;  Laterality: Right;  . Colostomy reversal    . Gws  2013    ABDOMINAL SURGERY  . Colostomy closure N/A 02/15/2013    Procedure: COLOSTOMY CLOSURE;  Surgeon: Gwenyth Ober, MD;  Location: [place];  Service: General;  Laterality: N/A;  Reversal of colostomy  . Dilatation & curettage/hysteroscopy with trueclear N/A 10/24/2013    Procedure: DILATATION & CURETTAGE/HYSTEROSCOPY WITH TRUECLEAR, CERVICAL BLOCK;  Surgeon: Marylynn Pearson, MD;  Location: [place] [facility];  Service: Gynecology;  Laterality: N/A;  . Breast reduction  06/2014   Family History:  Family History  Problem Relation Age of Onset  . Cancer Mother     kidney  . Depression Mother   . Other Neg Hx     adrenal problem   Social History:  History  Alcohol Use No     History  Drug Use No    Social History   Social History  . Marital Status: Legally Separated    Spouse Name: N/A  . Number of Children: 2  . Years of Education: N/A   Occupational History  . Disabled    Social History Main Topics  . Smoking status: Never Smoker   . Smokeless tobacco: Never Used  . Alcohol Use: No  . Drug Use: No   . Sexual Activity: Yes    Birth Control/ Protection: Surgical   Other Topics Concern  . None   Social History Narrative   ** Merged History Encounter **       Additional Social History:    Pain Medications: Pt denies Prescriptions: Invega, Trazodone, Prozac, Visitaril, Klonopin Over the Counter: Pt denies  History of alcohol / drug use?: No history of alcohol / drug abuse Longest period of sobriety (when/how long): NA                     Allergies:   Allergies  Allergen Reactions  . Enoxaparin Hives  . Augmentin [Amoxicillin-Pot Clavulanate] Itching, Swelling and Rash    Has patient had a PCN reaction causing immediate rash, facial/tongue/throat swelling, SOB or lightheadedness with hypotension: Yes Has patient had  a PCN reaction causing severe rash involving mucus membranes or skin necrosis: No Has patient had a PCN reaction that required hospitalization No Has patient had a PCN reaction occurring within the last 10 years: Yes If all of the above answers are "NO", then may proceed with Cephalosporin use.  . Avelox [Moxifloxacin Hcl In Nacl] Itching, Swelling and Rash  . Sodium Hydroxide Rash    Labs: No results found for this or any previous visit (from the past 48 hour(s)).  Vitals: Blood pressure 125/64, pulse 67, temperature 98.2 F (36.8 C), temperature source Oral, resp. rate 16, last menstrual period 09/27/2015, SpO2 96 %.  Risk to Self: Suicidal Ideation: No Suicidal Intent: No Is patient at risk for suicide?: Yes Suicidal Plan?: No Access to Means: Yes Specify Access to Suicidal Means: Access to pills What has been your use of drugs/alcohol within the last 12 months?: NA How many times?: 3 Other Self Harm Risks: NA Triggers for Past Attempts: None known Intentional Self Injurious Behavior: None Risk to Others: Homicidal Ideation: No Thoughts of Harm to Others: No Current Homicidal Intent: No Current Homicidal Plan: No Access to Homicidal Means:  No Identified Victim: NA History of harm to others?: No Assessment of Violence: None Noted Violent Behavior Description: NA Does patient have access to weapons?: No Criminal Charges Pending?: No Does patient have a court date: No Prior Inpatient Therapy: Prior Inpatient Therapy: Yes Prior Therapy Dates: 2015 Prior Therapy Facilty/Provider[facility]): [facility] Reason for Treatment: Schizophrenia Prior Outpatient Therapy: Prior Outpatient Therapy: Yes Prior Therapy Dates: 2016 Prior Therapy Facilty/Provider(s): [facility] Reason for Treatment: Schizophrenia Does patient have an ACCT team?: Yes Does patient have Intensive In-House Services?  : No Does patient have [facility] services? : No Does patient have P4CC services?: No  Current Facility-Administered Medications  Medication Dose Route Frequency Provider Last Rate Last Dose  . cloZAPine (CLOZARIL) tablet 25 mg  25 mg Oral QHS Milton Ferguson, MD   25 mg at 10/19/15 2106  . FLUoxetine (PROZAC) capsule 40 mg  40 mg Oral Daily Milton Ferguson, MD   40 mg at 10/20/15 1043  . hydrOXYzine (ATARAX/VISTARIL) tablet 25 mg  25 mg Oral Q6H PRN Milton Ferguson, MD   25 mg at 10/19/15 2106  . ibuprofen (ADVIL,MOTRIN) tablet 200 mg  200 mg Oral Q6H PRN Milton Ferguson, MD   200 mg at 10/16/15 1859  . ibuprofen (ADVIL,MOTRIN) tablet 600 mg  600 mg Oral Q8H PRN Forde Dandy, MD      . loperamide (IMODIUM) capsule 4 mg  4 mg Oral PRN Daleen Bo, MD   4 mg at 10/15/15 1445  . LORazepam (ATIVAN) tablet 1 mg  1 mg Oral Q6H PRN Milton Ferguson, MD   1 mg at 10/15/15 2343  . LORazepam (ATIVAN) tablet 2 mg  2 mg Oral Once Nat Christen, MD   2 mg at 10/14/15 2246  . metFORMIN (GLUCOPHAGE) tablet 500 mg  500 mg Oral Q breakfast Milton Ferguson, MD   500 mg at 10/16/15 3086  . ondansetron (ZOFRAN) tablet 4 mg  4 mg Oral Q8H PRN Forde Dandy, MD      . paliperidone [facility] SUSTENNA) injection 156 mg  156 mg Intramuscular Q28 days Milton Ferguson, MD   156 mg at 10/16/15 0603  .  pantoprazole (PROTONIX) EC tablet 40 mg  40 mg Oral Daily Milton Ferguson, MD   40 mg at 10/20/15 1043  . traZODone (DESYREL) tablet 50 mg  50 mg Oral QHS Broadus John  Roderic Palau, MD   50 mg at 10/19/15 2106   Current Outpatient Prescriptions  Medication Sig Dispense Refill  . cloZAPine (CLOZARIL) 50 MG tablet Take 1 tablet (50 mg total) by mouth at bedtime. For mood control (Patient taking differently: Take 25 mg by mouth at bedtime. For mood control) 30 tablet 0  . FLUoxetine (PROZAC) 40 MG capsule Take 1 capsule (40 mg total) by mouth daily. For depression 30 capsule 0  . hydrOXYzine (ATARAX/VISTARIL) 25 MG tablet Take 1 tablet (25 mg total) by mouth every 6 (six) hours as needed for anxiety. (Patient taking differently: Take 25 mg by mouth 4 (four) times daily. ) 45 tablet 0  . ibuprofen (ADVIL,MOTRIN) 200 MG tablet Take 800 mg by mouth every 6 (six) hours as needed (pain).     . metFORMIN (GLUCOPHAGE) 500 MG tablet Take 1 tablet (500 mg total) by mouth daily with breakfast. For diabetes management 30 tablet 0  . paliperidone (INVEGA SUSTENNA) 156 MG/ML SUSP injection Inject 1 mL (156 mg total) into the muscle every 28 (twenty-eight) days. (Last dose given on 09-10-15): For mood control (Patient taking differently: Inject 156 mg into the muscle every 28 (twenty-eight) days. Last dose early october) 0.9 mL 0  . traZODone (DESYREL) 50 MG tablet Take 1 tablet (50 mg total) by mouth at bedtime as needed for sleep. 30 tablet 0    Musculoskeletal: Strength & Muscle Tone: within normal limits Gait & Station: normal Patient leans: N/A  Psychiatric Specialty Exam: Physical Exam  Review of Systems  Constitutional: Negative.   HENT: Negative.   Eyes: Negative.   Respiratory: Negative.   Cardiovascular: Negative.   Gastrointestinal: Negative.   Genitourinary: Negative.   Musculoskeletal: Negative.   Skin: Negative.   Neurological: Negative.   Endo/Heme/Allergies: Negative.   Psychiatric/Behavioral:  Positive for depression (Stable ). Negative for suicidal ideas, hallucinations, memory loss and substance abuse. The patient is not nervous/anxious and does not have insomnia.     Blood pressure 125/64, pulse 67, temperature 98.2 F (36.8 C), temperature source Oral, resp. rate 16, last menstrual period 09/27/2015, SpO2 96 %.There is no weight on file to calculate BMI.  General Appearance: Casual  Eye Contact::  Good  Speech:  Clear and Coherent  Volume:  Normal  Mood:  Euthymic  Affect:  Appropriate  Thought Process:  Goal Directed and Intact  Orientation:  Full (Time, Place, and Person)  Thought Content:  WDL  Suicidal Thoughts:  No  Homicidal Thoughts:  No  Memory:  Immediate;   Good Recent;   Good Remote;   Good  Judgement:  Fair  Insight:  Present  Psychomotor Activity:  Normal  Concentration:  Good  Recall:  Good  Fund of Knowledge:Good  Language: Good  Akathisia:  No  Handed:  Right  AIMS (if indicated):     Assets:  Communication Skills Desire for Improvement Housing Leisure Time Physical Health Resilience Social Support  ADL's:  Intact  Cognition: WNL  Sleep:      Medical Decision Making: Established Problem, Stable/Improving (1), Review of Psycho-Social Stressors (1) and Review or order clinical lab tests (1)  Plan:  No evidence of imminent risk to self or others at present.   Patient does not meet criteria for psychiatric inpatient admission. Supportive therapy provided about ongoing stressors. Discussed crisis plan, support from social network, calling 911, coming to the Emergency Department, and calling Suicide Hotline. Disposition: Discharge to home with plan to follow up with ACT team for medication management  and psychotherapy  [name], NP-C 10/20/2015 11:53 AM

## 2015-10-20 NOTE — Progress Notes (Signed)
Spoke with L. Rosana Hoes, [facility] NP, who has evaluated pt this morning and recommends d/c home to follow up with her ACTT as she is denying SI/HI/psychosis and has appeared stable for d/c. Pt has been here voluntarily, therefore no IVC rescinsion warranted. Discussed pt's case with Dr. Dwyane Dee as well, who agrees with recommendation for d/c.  Called pt's ACTT [phone] and spoke with program director Theadora Rama, who states ACTT will follow up with pt once she is home today. Advised pt call them when she gets home so they can arrange to see her this evening or tomorrow morning, depending on timing. CSW spoke with [facility] and relayed message to pt.   Sharren Bridge, MSW, LCSW Clinical Social Work, Disposition  10/20/2015 [phone]

## 2015-10-20 NOTE — ED Notes (Addendum)
Spoke with [name], [facility] and she advised okay for patient to be discharged. Called and spoke with Mingo Amber, MD.

## 2015-10-20 NOTE — ED Provider Notes (Signed)
Patient is relaxing comfortably. She was deemed stable by the psych team. I evaluated her, she is relaxing, not aggressive, cooperative. Stable for discharge  1. Drug overdose, intentional, initial encounter ([place])   2. Suicide attempt [facility])      Evelina Bucy, MD 10/20/15 [phone]

## 2015-10-20 NOTE — ED Notes (Signed)
Moved TTS machine to patients room per [facility] request.

## 2015-10-27 DIAGNOSIS — K625 Hemorrhage of anus and rectum: Secondary | ICD-10-CM | POA: Diagnosis not present

## 2015-10-27 DIAGNOSIS — Z79899 Other long term (current) drug therapy: Secondary | ICD-10-CM | POA: Diagnosis not present

## 2015-11-06 DIAGNOSIS — R1032 Left lower quadrant pain: Secondary | ICD-10-CM | POA: Diagnosis not present

## 2015-11-06 DIAGNOSIS — R109 Unspecified abdominal pain: Secondary | ICD-10-CM | POA: Diagnosis not present

## 2015-11-06 DIAGNOSIS — R1031 Right lower quadrant pain: Secondary | ICD-10-CM | POA: Diagnosis not present

## 2015-11-07 DIAGNOSIS — K432 Incisional hernia without obstruction or gangrene: Secondary | ICD-10-CM | POA: Diagnosis not present

## 2015-11-10 DIAGNOSIS — Z79899 Other long term (current) drug therapy: Secondary | ICD-10-CM | POA: Diagnosis not present

## 2015-11-19 DIAGNOSIS — K625 Hemorrhage of anus and rectum: Secondary | ICD-10-CM | POA: Diagnosis not present

## 2015-11-19 DIAGNOSIS — R197 Diarrhea, unspecified: Secondary | ICD-10-CM | POA: Diagnosis not present

## 2015-11-24 DIAGNOSIS — Z79899 Other long term (current) drug therapy: Secondary | ICD-10-CM | POA: Diagnosis not present

## 2015-11-24 DIAGNOSIS — R197 Diarrhea, unspecified: Secondary | ICD-10-CM | POA: Diagnosis not present

## 2015-11-26 ENCOUNTER — Other Ambulatory Visit: Payer: Self-pay | Admitting: Surgery

## 2015-12-08 DIAGNOSIS — Z79899 Other long term (current) drug therapy: Secondary | ICD-10-CM | POA: Diagnosis not present

## 2015-12-09 DIAGNOSIS — G43909 Migraine, unspecified, not intractable, without status migrainosus: Secondary | ICD-10-CM | POA: Diagnosis not present

## 2015-12-10 DIAGNOSIS — N10 Acute tubulo-interstitial nephritis: Secondary | ICD-10-CM | POA: Diagnosis not present

## 2015-12-10 DIAGNOSIS — N12 Tubulo-interstitial nephritis, not specified as acute or chronic: Secondary | ICD-10-CM | POA: Diagnosis not present

## 2015-12-10 DIAGNOSIS — R109 Unspecified abdominal pain: Secondary | ICD-10-CM | POA: Diagnosis not present

## 2015-12-11 DIAGNOSIS — N390 Urinary tract infection, site not specified: Secondary | ICD-10-CM | POA: Diagnosis not present

## 2015-12-11 DIAGNOSIS — N261 Atrophy of kidney (terminal): Secondary | ICD-10-CM | POA: Diagnosis not present

## 2015-12-11 DIAGNOSIS — N3020 Other chronic cystitis without hematuria: Secondary | ICD-10-CM | POA: Diagnosis not present

## 2015-12-11 DIAGNOSIS — Q6211 Congenital occlusion of ureteropelvic junction: Secondary | ICD-10-CM | POA: Diagnosis not present

## 2015-12-11 DIAGNOSIS — N200 Calculus of kidney: Secondary | ICD-10-CM | POA: Diagnosis not present

## 2015-12-11 DIAGNOSIS — R109 Unspecified abdominal pain: Secondary | ICD-10-CM | POA: Diagnosis not present

## 2015-12-16 ENCOUNTER — Other Ambulatory Visit: Payer: Self-pay | Admitting: Gastroenterology

## 2015-12-16 DIAGNOSIS — R197 Diarrhea, unspecified: Secondary | ICD-10-CM | POA: Diagnosis not present

## 2015-12-16 DIAGNOSIS — K625 Hemorrhage of anus and rectum: Secondary | ICD-10-CM | POA: Diagnosis not present

## 2015-12-16 DIAGNOSIS — K5730 Diverticulosis of large intestine without perforation or abscess without bleeding: Secondary | ICD-10-CM | POA: Diagnosis not present

## 2015-12-18 NOTE — Pre-Procedure Instructions (Signed)
[name] [name]  12/18/2015      [facility] - [place], [place] [address] Phone: [phone] Fax: [phone]  [id number] - [place], [address] [place] [address] Phone: [phone] Fax: [phone]    Your procedure is scheduled on Wed, Dec 28 @ 11:00 AM  Report to [place] at 9:00 AM  Call this number if you have problems the morning of surgery:  [phone]   Remember:  Do not eat food or drink liquids after midnight.  Take these medicines the morning of surgery with A SIP OF WATER Fluoxetine(Prozac)              Stop taking your Ibuprofen. No Goody's,BC's,Aleve,Aspirin,Motrin,Advil,Fish Oil,or any Herbal Medications.    Do not wear jewelry, make-up or nail polish.  Do not wear lotions, powders, or perfumes.  You may wear deodorant.  Do not shave 48 hours prior to surgery.    Do not bring valuables to the hospital.  [facility] is not responsible for any belongings or valuables.  Contacts, dentures or bridgework may not be worn into surgery.  Leave your suitcase in the car.  After surgery it may be brought to your room.  For patients admitted to the hospital, discharge time will be determined by your treatment team.  Patients discharged the day of surgery will not be allowed to drive home.    Special instructions:  [place] - Preparing for Surgery  Before surgery, you can play an important role.  Because skin is not sterile, your skin needs to be as free of germs as possible.  You can reduce the number of germs on you skin by washing with CHG (chlorahexidine gluconate) soap before surgery.  CHG is an antiseptic cleaner which kills germs and bonds with the skin to continue killing germs even after washing.  Please DO NOT use if you have an allergy to CHG or antibacterial soaps.  If your skin becomes  reddened/irritated stop using the CHG and inform your nurse when you arrive at Short Stay.  Do not shave (including legs and underarms) for at least 48 hours prior to the first CHG shower.  You may shave your face.  Please follow these instructions carefully:   1.  Shower with CHG Soap the night before surgery and the                                morning of Surgery.  2.  If you choose to wash your hair, wash your hair first as usual with your       normal shampoo.  3.  After you shampoo, rinse your hair and body thoroughly to remove the                      Shampoo.  4.  Use CHG as you would any other liquid soap.  You can apply chg directly       to the skin and wash gently with scrungie or a clean washcloth.  5.  Apply the CHG Soap to your body ONLY FROM THE NECK DOWN.        Do not use on open wounds or open sores.  Avoid contact with your eyes,       ears,  mouth and genitals (private parts).  Wash genitals (private parts)       with your normal soap.  6.  Wash thoroughly, paying special attention to the area where your surgery        will be performed.  7.  Thoroughly rinse your body with warm water from the neck down.  8.  DO NOT shower/wash with your normal soap after using and rinsing off       the CHG Soap.  9.  Pat yourself dry with a clean towel.            10.  Wear clean pajamas.            11.  Place clean sheets on your bed the night of your first shower and do not        sleep with pets.  Day of Surgery  Do not apply any lotions/deoderants the morning of surgery.  Please wear clean clothes to the hospital/surgery center.    Please read over the following fact sheets that you were given. Pain Booklet, Coughing and Deep Breathing and Surgical Site Infection Prevention

## 2015-12-19 ENCOUNTER — Encounter (HOSPITAL_COMMUNITY): Payer: Self-pay

## 2015-12-19 ENCOUNTER — Encounter (HOSPITAL_COMMUNITY)
Admission: RE | Admit: 2015-12-19 | Discharge: 2015-12-19 | Disposition: A | Discharge status: Home / Self Care | Payer: Medicare Other | Source: Ambulatory Visit | Attending: Surgery | Admitting: Surgery

## 2015-12-19 DIAGNOSIS — K432 Incisional hernia without obstruction or gangrene: Secondary | ICD-10-CM | POA: Diagnosis not present

## 2015-12-19 DIAGNOSIS — Z01812 Encounter for preprocedural laboratory examination: Secondary | ICD-10-CM | POA: Insufficient documentation

## 2015-12-19 HISTORY — DX: Diverticulitis of intestine, part unspecified, without perforation or abscess without bleeding: K57.92

## 2015-12-19 HISTORY — DX: Functional diarrhea: K59.1

## 2015-12-19 HISTORY — DX: Headache: R51

## 2015-12-19 HISTORY — DX: Headache, unspecified: R51.9

## 2015-12-19 HISTORY — DX: Personal history of pulmonary embolism: Z86.711

## 2015-12-19 LAB — BASIC METABOLIC PANEL
Anion gap: 9 (ref 5–15)
BUN: 8 mg/dL (ref 6–20)
CALCIUM: 9.5 mg/dL (ref 8.9–10.3)
CO2: 24 mmol/L (ref 22–32)
CREATININE: 0.95 mg/dL (ref 0.44–1.00)
Chloride: 106 mmol/L (ref 101–111)
Glucose, Bld: 137 mg/dL — ABNORMAL HIGH (ref 65–99)
Potassium: 4.3 mmol/L (ref 3.5–5.1)
SODIUM: 139 mmol/L (ref 135–145)

## 2015-12-19 LAB — CBC
HCT: 41.6 % (ref 36.0–46.0)
Hemoglobin: 13.4 g/dL (ref 12.0–15.0)
MCH: 28.6 pg (ref 26.0–34.0)
MCHC: 32.2 g/dL (ref 30.0–36.0)
MCV: 88.9 fL (ref 78.0–100.0)
PLATELETS: 293 10*3/uL (ref 150–400)
RBC: 4.68 MIL/uL (ref 3.87–5.11)
RDW: 13.7 % (ref 11.5–15.5)
WBC: 6.4 10*3/uL (ref 4.0–10.5)

## 2015-12-19 LAB — HCG, SERUM, QUALITATIVE: PREG SERUM: NEGATIVE
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
## 2015-12-19 NOTE — Pre-Procedure Instructions (Signed)
[name] [name]  12/19/2015      Your procedure is scheduled on Wednesday, December 24, 2015 at 11:00 AM.   Report to [facility] Entrance "A" Admitting Office at 9:00 AM.   Call this number if you have problems the morning of surgery: [phone]   Any questions prior to day of surgery, please call [phone] between 8 & 4 PM.    Remember:  Do not eat food or drink liquids after midnight Tuesday, 12/23/15.  Take these medicines the morning of surgery with A SIP OF WATER: Fluoxetine (Prozac)  Stop NSAIDS (Ibuprofen, Aleve, etc.) and Multivitamins as of today.   Do not wear jewelry, make-up or nail polish.  Do not wear lotions, powders, or perfumes.  You may wear deodorant.  Do not shave 48 hours prior to surgery.    Do not bring valuables to the hospital.  [facility] is not responsible for any belongings or valuables.  Contacts, dentures or bridgework may not be worn into surgery.  Leave your suitcase in the car.  After surgery it may be brought to your room.  For patients admitted to the hospital, discharge time will be determined by your treatment team.  Special instructions:  [place] - Preparing for Surgery  Before surgery, you can play an important role.  Because skin is not sterile, your skin needs to be as free of germs as possible.  You can reduce the number of germs on you skin by washing with CHG (chlorahexidine gluconate) soap before surgery.  CHG is an antiseptic cleaner which kills germs and bonds with the skin to continue killing germs even after washing.  Please DO NOT use if you have an allergy to CHG or antibacterial soaps.  If your skin becomes reddened/irritated stop using the CHG and inform your nurse when you arrive at Short Stay.  Do not shave (including legs and underarms) for at least 48 hours prior to the first CHG shower.  You may shave your face.  Please follow these instructions carefully:   1.  Shower with CHG Soap the night  before surgery and the                                morning of Surgery.  2.  If you choose to wash your hair, wash your hair first as usual with your       normal shampoo.  3.  After you shampoo, rinse your hair and body thoroughly to remove the                      Shampoo.  4.  Use CHG as you would any other liquid soap.  You can apply chg directly       to the skin and wash gently with scrungie or a clean washcloth.  5.  Apply the CHG Soap to your body ONLY FROM THE NECK DOWN.        Do not use on open wounds or open sores.  Avoid contact with your eyes, ears, mouth and genitals (private parts).  Wash genitals (private parts) with your normal soap.  6.  Wash thoroughly, paying special attention to the area where your surgery        will be performed.  7.  Thoroughly rinse your body with warm water from the neck down.  8.  DO NOT shower/wash with your normal soap after  using and rinsing off       the CHG Soap.  9.  Pat yourself dry with a clean towel.            10.  Wear clean pajamas.            11.  Place clean sheets on your bed the night of your first shower and do not        sleep with pets.  Day of Surgery  Do not apply any lotions the morning of surgery.  Please wear clean clothes to the hospital.   Please read over the following fact sheets that you were given. Pain Booklet, Coughing and Deep Breathing and Surgical Site Infection Prevention

## 2015-12-22 DIAGNOSIS — J029 Acute pharyngitis, unspecified: Secondary | ICD-10-CM | POA: Diagnosis not present

## 2015-12-23 DIAGNOSIS — Z79899 Other long term (current) drug therapy: Secondary | ICD-10-CM | POA: Diagnosis not present

## 2015-12-23 MED ORDER — VANCOMYCIN HCL IN DEXTROSE 1-5 GM/200ML-% IV SOLN
1000.0000 mg | INTRAVENOUS | Status: AC
  Administered 2015-12-24: 1000 mg via INTRAVENOUS
  Filled 2015-12-23: qty 200
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
## 2015-12-24 ENCOUNTER — Inpatient Hospital Stay (HOSPITAL_COMMUNITY): Payer: Medicare Other | Admitting: Certified Registered Nurse Anesthetist

## 2015-12-24 ENCOUNTER — Encounter (HOSPITAL_COMMUNITY): Payer: Self-pay | Admitting: General Practice

## 2015-12-24 ENCOUNTER — Inpatient Hospital Stay (HOSPITAL_COMMUNITY)
Admission: RE | Admit: 2015-12-24 | Discharge: 2015-12-29 | DRG: 337 | Disposition: A | Discharge status: Home / Self Care | Payer: Medicare Other | Source: Ambulatory Visit | Attending: Surgery | Admitting: Surgery

## 2015-12-24 ENCOUNTER — Encounter (HOSPITAL_COMMUNITY)
Admission: RE | Disposition: A | Discharge status: Home / Self Care | Payer: Self-pay | Source: Ambulatory Visit | Attending: Surgery

## 2015-12-24 DIAGNOSIS — K432 Incisional hernia without obstruction or gangrene: Secondary | ICD-10-CM | POA: Diagnosis present

## 2015-12-24 DIAGNOSIS — Z79899 Other long term (current) drug therapy: Secondary | ICD-10-CM | POA: Diagnosis not present

## 2015-12-24 DIAGNOSIS — Z888 Allergy status to other drugs, medicaments and biological substances status: Secondary | ICD-10-CM | POA: Diagnosis not present

## 2015-12-24 DIAGNOSIS — F250 Schizoaffective disorder, bipolar type: Secondary | ICD-10-CM | POA: Diagnosis present

## 2015-12-24 DIAGNOSIS — D649 Anemia, unspecified: Secondary | ICD-10-CM | POA: Diagnosis not present

## 2015-12-24 DIAGNOSIS — Z880 Allergy status to penicillin: Secondary | ICD-10-CM

## 2015-12-24 DIAGNOSIS — K660 Peritoneal adhesions (postprocedural) (postinfection): Secondary | ICD-10-CM | POA: Diagnosis present

## 2015-12-24 DIAGNOSIS — Z915 Personal history of self-harm: Secondary | ICD-10-CM

## 2015-12-24 DIAGNOSIS — F603 Borderline personality disorder: Secondary | ICD-10-CM | POA: Diagnosis present

## 2015-12-24 DIAGNOSIS — N189 Chronic kidney disease, unspecified: Secondary | ICD-10-CM | POA: Diagnosis not present

## 2015-12-24 DIAGNOSIS — Z881 Allergy status to other antibiotic agents status: Secondary | ICD-10-CM | POA: Diagnosis not present

## 2015-12-24 HISTORY — PX: INCISIONAL HERNIA REPAIR: SHX193

## 2015-12-24 HISTORY — PX: INSERTION OF MESH: SHX5868

## 2015-12-24 SURGERY — REPAIR, HERNIA, INCISIONAL
Anesthesia: General | Site: Abdomen

## 2015-12-24 MED ORDER — BUPIVACAINE-EPINEPHRINE (PF) 0.25% -1:200000 IJ SOLN
INTRAMUSCULAR | Status: AC
  Filled 2015-12-24: qty 30

## 2015-12-24 MED ORDER — MIDAZOLAM HCL 5 MG/5ML IJ SOLN
INTRAMUSCULAR | Status: DC | PRN
  Administered 2015-12-24: 2 mg via INTRAVENOUS

## 2015-12-24 MED ORDER — HEPARIN SODIUM (PORCINE) 5000 UNIT/ML IJ SOLN
5000.0000 [IU] | Freq: Three times a day (TID) | INTRAMUSCULAR | Status: DC
  Administered 2015-12-24 – 2015-12-29 (×14): 5000 [IU] via SUBCUTANEOUS
  Filled 2015-12-24 (×13): qty 1

## 2015-12-24 MED ORDER — HYDROMORPHONE 1 MG/ML IV SOLN
INTRAVENOUS | Status: AC
  Filled 2015-12-24: qty 25

## 2015-12-24 MED ORDER — SODIUM CHLORIDE 0.9 % IJ SOLN: 9.0000 mL | INTRAMUSCULAR | Status: DC | PRN

## 2015-12-24 MED ORDER — ROCURONIUM BROMIDE 100 MG/10ML IV SOLN
INTRAVENOUS | Status: DC | PRN
  Administered 2015-12-24: 40 mg via INTRAVENOUS
  Administered 2015-12-24 (×2): 10 mg via INTRAVENOUS

## 2015-12-24 MED ORDER — LIDOCAINE HCL (CARDIAC) 20 MG/ML IV SOLN
INTRAVENOUS | Status: AC
  Filled 2015-12-24: qty 5

## 2015-12-24 MED ORDER — KETOROLAC TROMETHAMINE 30 MG/ML IJ SOLN
INTRAMUSCULAR | Status: AC
  Filled 2015-12-24: qty 1

## 2015-12-24 MED ORDER — HYDROMORPHONE HCL 1 MG/ML IJ SOLN
INTRAMUSCULAR | Status: AC
  Administered 2015-12-24: 0.5 mg via INTRAVENOUS
  Filled 2015-12-24: qty 1

## 2015-12-24 MED ORDER — EPHEDRINE SULFATE 50 MG/ML IJ SOLN
INTRAMUSCULAR | Status: AC
  Filled 2015-12-24: qty 1

## 2015-12-24 MED ORDER — OXYCODONE HCL 5 MG/5ML PO SOLN: 5.0000 mg | Freq: Once | ORAL | Status: DC | PRN

## 2015-12-24 MED ORDER — CLOZAPINE 25 MG PO TABS
25.0000 mg | ORAL_TABLET | Freq: Every day | ORAL | Status: DC
  Administered 2015-12-24 – 2015-12-28 (×4): 25 mg via ORAL
  Filled 2015-12-24 (×7): qty 1

## 2015-12-24 MED ORDER — PHENYLEPHRINE HCL 10 MG/ML IJ SOLN
INTRAMUSCULAR | Status: DC | PRN
  Administered 2015-12-24 (×6): 80 ug via INTRAVENOUS

## 2015-12-24 MED ORDER — DEXAMETHASONE SODIUM PHOSPHATE 10 MG/ML IJ SOLN
INTRAMUSCULAR | Status: DC | PRN
  Administered 2015-12-24: 10 mg via INTRAVENOUS

## 2015-12-24 MED ORDER — ONDANSETRON HCL 4 MG/2ML IJ SOLN
INTRAMUSCULAR | Status: DC | PRN
  Administered 2015-12-24: 4 mg via INTRAVENOUS

## 2015-12-24 MED ORDER — LACTATED RINGERS IV SOLN
INTRAVENOUS | Status: DC
  Administered 2015-12-24 (×3): via INTRAVENOUS

## 2015-12-24 MED ORDER — PHENYLEPHRINE 40 MCG/ML (10ML) SYRINGE FOR IV PUSH (FOR BLOOD PRESSURE SUPPORT)
PREFILLED_SYRINGE | INTRAVENOUS | Status: AC
  Filled 2015-12-24: qty 10

## 2015-12-24 MED ORDER — DIPHENHYDRAMINE HCL 12.5 MG/5ML PO ELIX: 12.5000 mg | ORAL_SOLUTION | Freq: Four times a day (QID) | ORAL | Status: DC | PRN

## 2015-12-24 MED ORDER — DIPHENHYDRAMINE HCL 12.5 MG/5ML PO ELIX
12.5000 mg | ORAL_SOLUTION | Freq: Four times a day (QID) | ORAL | Status: DC | PRN
Start: 2015-12-24 — End: 2015-12-29

## 2015-12-24 MED ORDER — HYDROMORPHONE 1 MG/ML IV SOLN
INTRAVENOUS | Status: DC
Start: 2015-12-24 — End: 2015-12-27
  Administered 2015-12-24: 13:00:00 via INTRAVENOUS
  Administered 2015-12-24: 5.4 mg via INTRAVENOUS
  Administered 2015-12-24: 2.1 mg via INTRAVENOUS
  Administered 2015-12-25: 2.4 mg via INTRAVENOUS
  Administered 2015-12-26: 3.6 mg via INTRAVENOUS
  Administered 2015-12-27: 1.2 mg via INTRAVENOUS
  Filled 2015-12-24: qty 25

## 2015-12-24 MED ORDER — PROMETHAZINE HCL 25 MG/ML IJ SOLN
12.5000 mg | Freq: Four times a day (QID) | INTRAMUSCULAR | Status: DC | PRN
  Administered 2015-12-27: 12.5 mg via INTRAVENOUS
  Filled 2015-12-24: qty 1

## 2015-12-24 MED ORDER — GLYCOPYRROLATE 0.2 MG/ML IJ SOLN
INTRAMUSCULAR | Status: DC | PRN
  Administered 2015-12-24: .8 mg via INTRAVENOUS
  Administered 2015-12-24: 0.2 mg via INTRAVENOUS

## 2015-12-24 MED ORDER — DEXAMETHASONE SODIUM PHOSPHATE 10 MG/ML IJ SOLN
INTRAMUSCULAR | Status: AC
  Filled 2015-12-24: qty 1

## 2015-12-24 MED ORDER — PROMETHAZINE HCL 25 MG/ML IJ SOLN: 6.2500 mg | INTRAMUSCULAR | Status: DC | PRN

## 2015-12-24 MED ORDER — MIDAZOLAM HCL 2 MG/2ML IJ SOLN
INTRAMUSCULAR | Status: AC
  Filled 2015-12-24: qty 2

## 2015-12-24 MED ORDER — NALOXONE HCL 0.4 MG/ML IJ SOLN: 0.4000 mg | INTRAMUSCULAR | Status: DC | PRN

## 2015-12-24 MED ORDER — NEOSTIGMINE METHYLSULFATE 10 MG/10ML IV SOLN
INTRAVENOUS | Status: DC | PRN
  Administered 2015-12-24: 5 mg via INTRAVENOUS

## 2015-12-24 MED ORDER — 0.9 % SODIUM CHLORIDE (POUR BTL) OPTIME
TOPICAL | Status: DC | PRN
  Administered 2015-12-24: 1000 mL

## 2015-12-24 MED ORDER — DIPHENHYDRAMINE HCL 50 MG/ML IJ SOLN: 12.5000 mg | Freq: Four times a day (QID) | INTRAMUSCULAR | Status: DC | PRN

## 2015-12-24 MED ORDER — POTASSIUM CHLORIDE IN NACL 20-0.9 MEQ/L-% IV SOLN
INTRAVENOUS | Status: DC
  Administered 2015-12-24 – 2015-12-26 (×5): via INTRAVENOUS
  Administered 2015-12-27: 1 mL via INTRAVENOUS
  Filled 2015-12-24 (×7): qty 1000

## 2015-12-24 MED ORDER — PROPOFOL 10 MG/ML IV BOLUS
INTRAVENOUS | Status: AC
  Filled 2015-12-24: qty 20

## 2015-12-24 MED ORDER — ROCURONIUM BROMIDE 50 MG/5ML IV SOLN
INTRAVENOUS | Status: AC
  Filled 2015-12-24: qty 1

## 2015-12-24 MED ORDER — FENTANYL CITRATE (PF) 100 MCG/2ML IJ SOLN
INTRAMUSCULAR | Status: DC | PRN
  Administered 2015-12-24: 150 ug via INTRAVENOUS

## 2015-12-24 MED ORDER — PROPOFOL 10 MG/ML IV BOLUS
INTRAVENOUS | Status: DC | PRN
  Administered 2015-12-24: 200 mg via INTRAVENOUS

## 2015-12-24 MED ORDER — HYDROMORPHONE HCL 1 MG/ML IJ SOLN
0.2500 mg | INTRAMUSCULAR | Status: DC | PRN
  Administered 2015-12-24 (×2): 0.5 mg via INTRAVENOUS

## 2015-12-24 MED ORDER — FENTANYL CITRATE (PF) 250 MCG/5ML IJ SOLN
INTRAMUSCULAR | Status: AC
  Filled 2015-12-24: qty 5

## 2015-12-24 MED ORDER — FLUOXETINE HCL 20 MG PO CAPS
40.0000 mg | ORAL_CAPSULE | Freq: Every day | ORAL | Status: DC
  Administered 2015-12-24 – 2015-12-29 (×6): 40 mg via ORAL
  Filled 2015-12-24 (×6): qty 2

## 2015-12-24 MED ORDER — SUCCINYLCHOLINE CHLORIDE 20 MG/ML IJ SOLN
INTRAMUSCULAR | Status: AC
  Filled 2015-12-24: qty 1

## 2015-12-24 MED ORDER — PHENYLEPHRINE HCL 10 MG/ML IJ SOLN
10.0000 mg | INTRAVENOUS | Status: DC | PRN
  Administered 2015-12-24: 20 ug/min via INTRAVENOUS

## 2015-12-24 MED ORDER — ONDANSETRON HCL 4 MG/2ML IJ SOLN
4.0000 mg | Freq: Four times a day (QID) | INTRAMUSCULAR | Status: DC | PRN
  Administered 2015-12-25: 4 mg via INTRAVENOUS
  Filled 2015-12-24: qty 2

## 2015-12-24 MED ORDER — HYDROXYZINE HCL 25 MG PO TABS
25.0000 mg | ORAL_TABLET | Freq: Four times a day (QID) | ORAL | Status: DC
  Administered 2015-12-24 – 2015-12-29 (×18): 25 mg via ORAL
  Filled 2015-12-24 (×19): qty 1

## 2015-12-24 MED ORDER — TRAZODONE HCL 100 MG PO TABS
200.0000 mg | ORAL_TABLET | Freq: Every day | ORAL | Status: DC
  Administered 2015-12-24 – 2015-12-28 (×4): 200 mg via ORAL
  Filled 2015-12-24 (×5): qty 2

## 2015-12-24 MED ORDER — LIDOCAINE HCL (CARDIAC) 20 MG/ML IV SOLN
INTRAVENOUS | Status: DC | PRN
  Administered 2015-12-24: 50 mg via INTRAVENOUS

## 2015-12-24 MED ORDER — OXYCODONE HCL 5 MG PO TABS: 5.0000 mg | ORAL_TABLET | Freq: Once | ORAL | Status: DC | PRN

## 2015-12-24 MED ORDER — KETOROLAC TROMETHAMINE 30 MG/ML IJ SOLN
30.0000 mg | Freq: Once | INTRAMUSCULAR | Status: AC
  Administered 2015-12-24: 30 mg via INTRAVENOUS

## 2015-12-24 MED ORDER — ONDANSETRON HCL 4 MG/2ML IJ SOLN
INTRAMUSCULAR | Status: AC
  Filled 2015-12-24: qty 2

## 2015-12-24 SURGICAL SUPPLY — 51 items
BIOPATCH RED 1 DISK 7.0 (GAUZE/BANDAGES/DRESSINGS) ×4 IMPLANT
BIOPATCH RED 1IN DISK 7.0MM (GAUZE/BANDAGES/DRESSINGS) ×2
BLADE SURG ROTATE 9660 (MISCELLANEOUS) IMPLANT
CANISTER SUCTION 2500CC (MISCELLANEOUS) ×3 IMPLANT
CHLORAPREP W/TINT 26ML (MISCELLANEOUS) ×3 IMPLANT
COVER SURGICAL LIGHT HANDLE (MISCELLANEOUS) ×3 IMPLANT
DRAIN CHANNEL 19F RND (DRAIN) ×6 IMPLANT
DRAPE LAPAROSCOPIC ABDOMINAL (DRAPES) ×3 IMPLANT
DRAPE UTILITY XL STRL (DRAPES) ×3 IMPLANT
DRSG OPSITE POSTOP 4X10 (GAUZE/BANDAGES/DRESSINGS) ×3 IMPLANT
DRSG OPSITE POSTOP 4X6 (GAUZE/BANDAGES/DRESSINGS) ×3 IMPLANT
DRSG TEGADERM 2-3/8X2-3/4 SM (GAUZE/BANDAGES/DRESSINGS) ×9 IMPLANT
DRSG TEGADERM 4X4.75 (GAUZE/BANDAGES/DRESSINGS) ×6 IMPLANT
ELECT CAUTERY BLADE 6.4 (BLADE) ×3 IMPLANT
ELECT REM PT RETURN 9FT ADLT (ELECTROSURGICAL) ×3
ELECTRODE REM PT RTRN 9FT ADLT (ELECTROSURGICAL) ×1 IMPLANT
EVACUATOR SILICONE 100CC (DRAIN) ×6 IMPLANT
GAUZE SPONGE 4X4 12PLY STRL (GAUZE/BANDAGES/DRESSINGS) ×3 IMPLANT
GLOVE BIOGEL PI IND STRL 7.0 (GLOVE) ×1 IMPLANT
GLOVE BIOGEL PI INDICATOR 7.0 (GLOVE) ×2
GLOVE SURG SIGNA 7.5 PF LTX (GLOVE) ×3 IMPLANT
GOWN STRL REUS W/ TWL LRG LVL3 (GOWN DISPOSABLE) ×1 IMPLANT
GOWN STRL REUS W/ TWL XL LVL3 (GOWN DISPOSABLE) ×1 IMPLANT
GOWN STRL REUS W/TWL LRG LVL3 (GOWN DISPOSABLE) ×3
GOWN STRL REUS W/TWL XL LVL3 (GOWN DISPOSABLE) ×3
KIT BASIN OR (CUSTOM PROCEDURE TRAY) ×3 IMPLANT
KIT ROOM TURNOVER OR (KITS) ×3 IMPLANT
MARKER SKIN DUAL TIP RULER LAB (MISCELLANEOUS) ×3 IMPLANT
MESH VENTRALIGHT ST 8X10 (Mesh General) ×3 IMPLANT
NEEDLE HYPO 25GX1X1/2 BEV (NEEDLE) ×3 IMPLANT
NS IRRIG 1000ML POUR BTL (IV SOLUTION) ×3 IMPLANT
PACK GENERAL/GYN (CUSTOM PROCEDURE TRAY) ×3 IMPLANT
PAD ARMBOARD 7.5X6 YLW CONV (MISCELLANEOUS) ×3 IMPLANT
STAPLER VISISTAT 35W (STAPLE) ×3 IMPLANT
SUT ETHILON 2 0 FS 18 (SUTURE) ×6 IMPLANT
SUT MNCRL AB 4-0 PS2 18 (SUTURE) ×3 IMPLANT
SUT NOVA 1 T20/GS 25DT (SUTURE) ×9 IMPLANT
SUT NOVA NAB DX-16 0-1 5-0 T12 (SUTURE) IMPLANT
SUT PDS AB 1 CT  36 (SUTURE)
SUT PDS AB 1 CT 36 (SUTURE) IMPLANT
SUT PDS AB 1 TP1 96 (SUTURE) ×6 IMPLANT
SUT PROLENE 1 CT (SUTURE) IMPLANT
SUT PROLENE 2 0 CT2 30 (SUTURE) IMPLANT
SUT SILK 3 0 SH CR/8 (SUTURE) ×3 IMPLANT
SUT VIC AB 3-0 SH 27 (SUTURE) ×5
SUT VIC AB 3-0 SH 27X BRD (SUTURE) ×1 IMPLANT
SUT VIC AB 3-0 SH 27XBRD (SUTURE) ×1 IMPLANT
SYR CONTROL 10ML LL (SYRINGE) ×3 IMPLANT
TOWEL OR 17X24 6PK STRL BLUE (TOWEL DISPOSABLE) ×3 IMPLANT
TOWEL OR 17X26 10 PK STRL BLUE (TOWEL DISPOSABLE) ×3 IMPLANT
TRAY FOLEY CATH 14FRSI W/METER (CATHETERS) ×3 IMPLANT

## 2015-12-24 NOTE — Interval H&P Note (Signed)
History and Physical Interval Note:no change in H and P  12/24/2015 9:51 AM  [name] [name]  has presented today for surgery, with the diagnosis of INCISIONAL HERNIA  The various methods of treatment have been discussed with the patient and family. After consideration of risks, benefits and other options for treatment, the patient has consented to  Procedure(s):  [facility] (N/A) INSERTION OF MESH (N/A) as a surgical intervention .  The patient's history has been reviewed, patient examined, no change in status, stable for surgery.  I have reviewed the patient's chart and labs.  Questions were answered to the patient's satisfaction.     Shirin Echeverry A
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
## 2015-12-24 NOTE — Care Management Note (Signed)
Case Management Note  Patient Details  Name: [name] [name] MRN: [id number] Date of Birth: 22-Dec-1971  Subjective/Objective:                    Action/Plan:  Initial UR completed  Expected Discharge Date:                  Expected Discharge Plan:  Home/Self Care  In-House Referral:     Discharge planning Services     Post Acute Care Choice:    Choice offered to:     DME Arranged:    DME Agency:     HH Arranged:    [place] Agency:     Status of Service:  In process, will continue to follow  Medicare Important Message Given:    Date Medicare IM Given:    Medicare IM give by:    Date Additional Medicare IM Given:    Additional Medicare Important Message give by:     If discussed at [place] of Stay Meetings, dates discussed:    Additional Comments:  Marilu Favre, RN 12/24/2015, 2:11 PM

## 2015-12-24 NOTE — Anesthesia Procedure Notes (Signed)
Procedure Name: Intubation Date/Time: 12/24/2015 10:35 AM Performed by: Rejeana Brock L Pre-anesthesia Checklist: Patient identified, Emergency Drugs available, Suction available, Patient being monitored and Timeout performed Patient Re-evaluated:Patient Re-evaluated prior to inductionOxygen Delivery Method: Circle system utilized Preoxygenation: Pre-oxygenation with 100% oxygen Intubation Type: IV induction Ventilation: Mask ventilation without difficulty Laryngoscope Size: Mac and 3 Grade View: Grade I Tube type: Oral Tube size: 7.0 mm Number of attempts: 1 Airway Equipment and Method: Stylet Placement Confirmation: ETT inserted through vocal cords under direct vision,  positive ETCO2 and breath sounds checked- equal and bilateral Secured at: 21 cm Tube secured with: Tape Dental Injury: Teeth and Oropharynx as per pre-operative assessment

## 2015-12-24 NOTE — Anesthesia Preprocedure Evaluation (Addendum)
Anesthesia Evaluation  Patient identified by MRN, date of birth, ID band Patient awake    Reviewed: Allergy & Precautions, NPO status , Patient's Chart, lab work & pertinent test results  Airway Mallampati: III  TM Distance: >3 FB Neck ROM: Full    Dental  (+) Dental Advisory Given   Pulmonary neg pulmonary ROS,    breath sounds clear to auscultation       Cardiovascular hypertension,  Rhythm:Regular Rate:Normal     Neuro/Psych Anxiety Depression Bipolar Disorder Schizophrenia negative neurological ROS     GI/Hepatic negative GI ROS, Neg liver ROS,   Endo/Other  Morbid obesity  Renal/GU negative Renal ROS     Musculoskeletal   Abdominal   Peds  Hematology negative hematology ROS (+)   Anesthesia Other Findings   Reproductive/Obstetrics                            Anesthesia Physical Anesthesia Plan  ASA: II  Anesthesia Plan: General   Post-op Pain Management:    Induction: Intravenous  Airway Management Planned: Oral ETT  Additional Equipment:   Intra-op Plan:   Post-operative Plan: Extubation in OR  Informed Consent: I have reviewed the patients History and Physical, chart, labs and discussed the procedure including the risks, benefits and alternatives for the proposed anesthesia with the patient or authorized representative who has indicated his/her understanding and acceptance.     Plan Discussed with: CRNA  Anesthesia Plan Comments:         Anesthesia Quick Evaluation

## 2015-12-24 NOTE — Progress Notes (Signed)
Orthopedic Tech Progress Note Patient Details:  [name] [name] 30-Apr-1971 [id number]  Ortho Devices Type of Ortho Device: Abdominal binder Ortho Device/Splint Location: abdomen Ortho Device/Splint Interventions: Loanne Drilling, Griffith Santilli 12/24/2015, 12:45 PM

## 2015-12-24 NOTE — Transfer of Care (Signed)
Immediate Anesthesia Transfer of Care Note  Patient: [name] [name]  Procedure(s) Performed: Procedure(s):  INCISIONAL HERNIA REPAIR WITH MESH (N/A) INSERTION OF MESH (N/A)  Patient Location: PACU  Anesthesia Type:General  Level of Consciousness: awake  Airway & Oxygen Therapy: Patient Spontanous Breathing and Patient connected to nasal cannula oxygen  Post-op Assessment: Report given to RN and Post -op Vital signs reviewed and stable  Post vital signs: stable  Last Vitals:  Filed Vitals:   12/24/15 0847  BP: 110/46  Pulse: 89  Temp: 36.8 C  Resp: 20    Complications: No apparent anesthesia complications

## 2015-12-24 NOTE — Op Note (Signed)
INCISIONAL HERNIA REPAIR WITH MESH, INSERTION OF MESH  Procedure Note  SORIYAH TRACH 12/24/2015   Pre-op Diagnosis: INCISIONAL HERNIA     Post-op Diagnosis: same  Procedure(s):  INCISIONAL HERNIA REPAIR WITH MESH INSERTION OF MESH 1 HOUR LYSIS OF ADHESIONS  Surgeon(s): Coralie Keens, MD  Anesthesia: General  Staff:  Circulator: Rosanne Sack, RN; Milas Kocher, RN; Cyd Silence, RN Scrub Person: Rolan Bucco RN First Assistant: Cyd Silence, RN  Estimated Blood Loss: Minimal                         Coralie Keens A   Date: 12/24/2015  Time: 12:15 PM

## 2015-12-25 ENCOUNTER — Encounter (HOSPITAL_COMMUNITY): Payer: Self-pay | Admitting: Surgery

## 2015-12-25 LAB — BASIC METABOLIC PANEL
ANION GAP: 6 (ref 5–15)
BUN: 12 mg/dL (ref 6–20)
CHLORIDE: 107 mmol/L (ref 101–111)
CO2: 27 mmol/L (ref 22–32)
Calcium: 7.8 mg/dL — ABNORMAL LOW (ref 8.9–10.3)
Creatinine, Ser: 1.01 mg/dL — ABNORMAL HIGH (ref 0.44–1.00)
GFR calc non Af Amer: >60 mL/min (ref >60)
Glucose, Bld: 133 mg/dL — ABNORMAL HIGH (ref 65–99)
POTASSIUM: 4.3 mmol/L (ref 3.5–5.1)
SODIUM: 140 mmol/L (ref 135–145)

## 2015-12-25 LAB — CBC
HEMATOCRIT: 35.3 % — AB (ref 36.0–46.0)
HEMOGLOBIN: 11.2 g/dL — AB (ref 12.0–15.0)
MCH: 29 pg (ref 26.0–34.0)
MCHC: 31.7 g/dL (ref 30.0–36.0)
MCV: 91.5 fL (ref 78.0–100.0)
Platelets: 306 10*3/uL (ref 150–400)
RBC: 3.86 MIL/uL — AB (ref 3.87–5.11)
RDW: 14.4 % (ref 11.5–15.5)
WBC: 8.9 10*3/uL (ref 4.0–10.5)

## 2015-12-25 NOTE — Progress Notes (Signed)
1 Day Post-Op  Subjective: Comfortable Mild nausea   Objective: Vital signs in last 24 hours: Temp:  [98 F (36.7 C)-99.3 F (37.4 C)] 98 F (36.7 C) (12/29 0958) Pulse Rate:  [76-90] 80 (12/29 0958) Resp:  [8-20] 16 (12/29 0958) BP: (93-108)/(45-63) 101/56 mmHg (12/29 0958) SpO2:  [94 %-99 %] 94 % (12/29 0958) FiO2 (%):  [94 %-99 %] 99 % (12/29 0510) Last BM Date: 12/23/15  Intake/Output from previous day: 12/28 0701 - 12/29 0700 In: 4147.9 [P.O.:240; I.V.:3847.9] Out: 1360 [Urine:1275; Drains:65; Blood:20] Intake/Output this shift: Total I/O In: 240 [P.O.:240] Out: -   Lungs clear Abdomen soft, binder in place, drains serosang  Lab Results:   Recent Labs  12/25/15 0534  WBC 8.9  HGB 11.2*  HCT 35.3*  PLT 306   BMET  Recent Labs  12/25/15 0534  NA 140  K 4.3  CL 107  CO2 27  GLUCOSE 133*  BUN 12  CREATININE 1.01*  CALCIUM 7.8*   PT/INR No results for input(s): LABPROT, INR in the last 72 hours. ABG No results for input(s): PHART, HCO3 in the last 72 hours.  Invalid input(s): PCO2, PO2  Studies/Results: No results found.  Anti-infectives: Anti-infectives    Start     Dose/Rate Route Frequency Ordered Stop   12/24/15 1030  vancomycin (VANCOCIN) IVPB 1000 mg/200 mL premix     1,000 mg 200 mL/hr over 60 Minutes Intravenous To [facility] 12/23/15 1111 12/24/15 1120      Assessment/Plan: s/p Procedure(s):  INCISIONAL HERNIA REPAIR WITH MESH (N/A) INSERTION OF MESH (N/A)  Start clear liquids Remove foley Ambulate Continue PCA for pain control No psychiatric issues at this point  LOS: 1 day    [name] [name] A 12/25/2015

## 2015-12-25 NOTE — Progress Notes (Signed)
Attempted to perform bath, Pt requested to go back to bed and sleep.

## 2015-12-26 MED ORDER — OXYCODONE-ACETAMINOPHEN 5-325 MG PO TABS: 1.0000 | ORAL_TABLET | ORAL | Status: DC | PRN

## 2015-12-26 NOTE — Care Management Important Message (Signed)
Important Message  Patient Details  Name: [name] [name] MRN: [id number] Date of Birth: [birth year]   Medicare Important Message Given:  Yes    Holdan Stucke P [name] 12/26/2015, 3:01 PM

## 2015-12-26 NOTE — Progress Notes (Signed)
Pt. responded to use of IS, coughing and deep breathing; T 99.5

## 2015-12-26 NOTE — Anesthesia Postprocedure Evaluation (Signed)
Anesthesia Post Note  Patient: [name] [name]  Procedure(s) Performed: Procedure(s) (LRB):  INCISIONAL HERNIA REPAIR WITH MESH (N/A) INSERTION OF MESH (N/A)  Patient location during evaluation: PACU Anesthesia Type: General Level of consciousness: awake and alert Pain management: pain level controlled Vital Signs Assessment: post-procedure vital signs reviewed and stable Respiratory status: spontaneous breathing Cardiovascular status: blood pressure returned to baseline Anesthetic complications: no    Last Vitals:  Filed Vitals:   12/26/15 2000 12/26/15 2136  BP:  108/67  Pulse:  76  Temp:  36.8 C  Resp: 23 19    Last Pain:  Filed Vitals:   12/26/15 2149  PainSc: 0-No pain                 Tiajuana Amass

## 2015-12-26 NOTE — Progress Notes (Signed)
2 Days Post-Op  Subjective: POD#2 Nausea improved Voiding better  Objective: Vital signs in last 24 hours: Temp:  [98 F (36.7 C)-101.5 F (38.6 C)] 99.3 F (37.4 C) (12/30 0138) Pulse Rate:  [80-103] 87 (12/30 0138) Resp:  [8-20] 20 (12/30 0138) BP: (101-132)/(51-80) 114/56 mmHg (12/30 0138) SpO2:  [94 %-95 %] 94 % (12/30 0138) Last BM Date: 12/23/15  Intake/Output from previous day: 12/29 0701 - 12/30 0700 In: 1200 [P.O.:480; I.V.:720] Out: 740 [Urine:650; Drains:90] Intake/Output this shift: Total I/O In: -  Out: 40 [Drains:40]  Lungs clear Abdomen soft, appropriately tender Drains serosang  Lab Results:   Recent Labs  12/25/15 0534  WBC 8.9  HGB 11.2*  HCT 35.3*  PLT 306   BMET  Recent Labs  12/25/15 0534  NA 140  K 4.3  CL 107  CO2 27  GLUCOSE 133*  BUN 12  CREATININE 1.01*  CALCIUM 7.8*   PT/INR No results for input(s): LABPROT, INR in the last 72 hours. ABG No results for input(s): PHART, HCO3 in the last 72 hours.  Invalid input(s): PCO2, PO2  Studies/Results: No results found.  Anti-infectives: Anti-infectives    Start     Dose/Rate Route Frequency Ordered Stop   12/24/15 1030  vancomycin (VANCOCIN) IVPB 1000 mg/200 mL premix     1,000 mg 200 mL/hr over 60 Minutes Intravenous To [facility] 12/23/15 1111 12/24/15 1120      Assessment/Plan: s/p Procedure(s):  INCISIONAL HERNIA REPAIR WITH MESH (N/A) INSERTION OF MESH (N/A)  Ambulate today Keep on clear liquids  LOS: 2 days    [name] [name] A 12/26/2015

## 2015-12-27 MED ORDER — OXYCODONE HCL 5 MG PO TABS
5.0000 mg | ORAL_TABLET | ORAL | Status: DC | PRN
  Administered 2015-12-27 – 2015-12-28 (×5): 10 mg via ORAL
  Filled 2015-12-27 (×5): qty 2

## 2015-12-27 MED ORDER — KCL IN DEXTROSE-NACL 20-5-0.45 MEQ/L-%-% IV SOLN
INTRAVENOUS | Status: DC
  Administered 2015-12-27 – 2015-12-28 (×2): via INTRAVENOUS
  Filled 2015-12-27 (×3): qty 1000

## 2015-12-27 MED ORDER — HYDROMORPHONE HCL 1 MG/ML IJ SOLN
1.0000 mg | INTRAMUSCULAR | Status: DC | PRN
  Administered 2015-12-27: 1 mg via INTRAVENOUS
  Filled 2015-12-27: qty 1

## 2015-12-27 NOTE — Progress Notes (Signed)
3 Days Post-Op  Subjective: Having flatus, tol clears, up oob yesterday, no real complaints  Objective: Vital signs in last 24 hours: Temp:  [97.7 F (36.5 C)-98.5 F (36.9 C)] 98.5 F (36.9 C) (12/31 0521) Pulse Rate:  [69-84] 70 (12/31 0521) Resp:  [10-23] 22 (12/31 0750) BP: (106-113)/(48-67) 113/51 mmHg (12/31 0521) SpO2:  [94 %-99 %] 96 % (12/31 0750) FiO2 (%):  [97 %] 97 % (12/30 2000) Last BM Date: 12/23/15  Intake/Output from previous day: 12/30 0701 - 12/31 0700 In: 3391 [P.O.:1300; I.V.:2091] Out: 2025 [Urine:1900; Drains:125] Intake/Output this shift:    General appearance: no distress Resp: clear to auscultation bilaterally Cardio: regular rate and rhythm GI: incision clean, drains serous, bs present approp tender  Lab Results:   Recent Labs  12/25/15 0534  WBC 8.9  HGB 11.2*  HCT 35.3*  PLT 306   BMET  Recent Labs  12/25/15 0534  NA 140  K 4.3  CL 107  CO2 27  GLUCOSE 133*  BUN 12  CREATININE 1.01*  CALCIUM 7.8*   PT/INR No results for input(s): LABPROT, INR in the last 72 hours. ABG No results for input(s): PHART, HCO3 in the last 72 hours.  Invalid input(s): PCO2, PO2  Studies/Results: No results found.  Anti-infectives: Anti-infectives    Start     Dose/Rate Route Frequency Ordered Stop   12/24/15 1030  vancomycin (VANCOCIN) IVPB 1000 mg/200 mL premix     1,000 mg 200 mL/hr over 60 Minutes Intravenous To [facility] 12/23/15 1111 12/24/15 1120      Assessment/Plan: POD 3 incisional hernia repair  1. Will dc pca today and try oral pain meds with iv backup, continue psych meds 2. pulm toilet, oob 3. Advance to fulls 4. Heparin, scds 5, continue drains  [facility] 12/27/2015

## 2015-12-28 MED ORDER — SALINE SPRAY 0.65 % NA SOLN
1.0000 | NASAL | Status: DC | PRN
Start: 2015-12-28 — End: 2015-12-29
  Administered 2015-12-28: 1 via NASAL
  Filled 2015-12-28: qty 44

## 2015-12-28 NOTE — Progress Notes (Signed)
4 Days Post-Op  Subjective: Sleepy.  Depressed affect.  Doesn't appear to be in any distress. No vomiting recorded.  Tolerating diet somewhat.  Had a stool.  Says she is ambulating but probably not much.  Objective: Vital signs in last 24 hours: Temp:  [98.2 F (36.8 C)-98.8 F (37.1 C)] 98.8 F (37.1 C) (01/01 0538) Pulse Rate:  [71-88] 88 (01/01 0538) Resp:  [16-18] 18 (01/01 0538) BP: (111-141)/(50-80) 141/80 mmHg (01/01 0538) SpO2:  [94 %-96 %] 94 % (01/01 0538) Last BM Date: 12/27/15  Intake/Output from previous day: 12/31 0701 - 01/01 0700 In: 1990.8 [P.O.:960; I.V.:1030.8] Out: 880 [Urine:750; Drains:130] Intake/Output this shift:    General appearance: no distress.  Depressed affect.  Suspect this is due to her psychiatric medications.   Resp: clear to auscultation bilaterally Cardio: regular rate and rhythm GI: incision clean, drains serous, bs present approp tender   Lab Results:  No results found for this or any previous visit (from the past 24 hour(s)).   Studies/Results: No results found.  . cloZAPine  25 mg Oral QHS  . FLUoxetine  40 mg Oral Daily  . heparin  5,000 Units Subcutaneous 3 times per day  . hydrOXYzine  25 mg Oral QID  . traZODone  200 mg Oral QHS     Assessment/Plan: s/p Procedure(s):  INCISIONAL HERNIA REPAIR WITH MESH INSERTION OF MESH   POD#4 Stable.  Will try to increase ambulation and increased diet. Drains will remain in place, now and at discharge Push coughing and deep breathing.  @PROBHOSP @  LOS: 4 days    Vola Beneke M 12/28/2015  . .prob

## 2015-12-29 MED ORDER — ACETAMINOPHEN 325 MG PO TABS
650.0000 mg | ORAL_TABLET | Freq: Four times a day (QID) | ORAL | Status: DC | PRN
  Administered 2015-12-29: 650 mg via ORAL
  Filled 2015-12-29: qty 2

## 2015-12-29 NOTE — Progress Notes (Signed)
Discharge paperwork given to patient. IV removed. Discharge papers given to patient. No questions verbalized. Patient is ready for discharge.
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
## 2015-12-29 NOTE — Care Management Important Message (Signed)
Important Message  Patient Details  Name: DOMINIC HINOTE MRN: [id number] Date of Birth: 09/08/1971   Medicare Important Message Given:  Yes    Louanne Belton 12/29/2015, 1:12 PMImportant Message  Patient Details  Name: LAVEEDA OGAS MRN: [id number] Date of Birth: [birth year]   Medicare Important Message Given:  Yes    Mackenzy Eisenberg G 12/29/2015, 1:12 PM

## 2015-12-29 NOTE — Discharge Instructions (Signed)
[facility] _______[facility]  UMBILICAL OR INGUINAL HERNIA REPAIR: POST OP INSTRUCTIONS  Always review your discharge instruction sheet given to you by the facility where your surgery was performed. IF YOU HAVE DISABILITY OR FAMILY LEAVE FORMS, YOU MUST BRING THEM TO THE OFFICE FOR PROCESSING.   DO NOT GIVE THEM TO YOUR DOCTOR.  1. A  prescription for pain medication may be given to you upon discharge.  Take your pain medication as prescribed, if needed.  If narcotic pain medicine is not needed, then you may take acetaminophen (Tylenol) or ibuprofen (Advil) as needed. 2. Take your usually prescribed medications unless otherwise directed. 3. If you need a refill on your pain medication, please contact your pharmacy.  They will contact our office to request authorization. Prescriptions will not be filled after 5 pm or on week-ends. 4. You should follow a light diet the first 24 hours after arrival home, such as soup and crackers, etc.  Be sure to include lots of fluids daily.  Resume your normal diet the day after surgery. 5. Most patients will experience some swelling and bruising around the umbilicus or in the groin and scrotum.  Ice packs and reclining will help.  Swelling and bruising can take several days to resolve.  6. It is common to experience some constipation if taking pain medication after surgery.  Increasing fluid intake and taking a stool softener (such as Colace) will usually help or prevent this problem from occurring.  A mild laxative (Milk of Magnesia or Miralax) should be taken according to package directions if there are no bowel movements after 48 hours. 7. Unless discharge instructions indicate otherwise, you may remove your bandages 24-48 hours after surgery, and you may shower at that time.  You may have steri-strips (small skin tapes) in place directly over the incision.  These strips should be left on the skin for 7-10 days.  If your surgeon used skin glue on the  incision, you may shower in 24 hours.  The glue will flake off over the next 2-3 weeks.  Any sutures or staples will be removed at the office during your follow-up visit. 8. ACTIVITIES:  You may resume regular (light) daily activities beginning the next day--such as daily self-care, walking, climbing stairs--gradually increasing activities as tolerated.  You may have sexual intercourse when it is comfortable.  Refrain from any heavy lifting or straining until approved by your doctor. a. You may drive when you are no longer taking prescription pain medication, you can comfortably wear a seatbelt, and you can safely maneuver your car and apply brakes. b. RETURN TO WORK:  __________________________________________________________ 9. You should see your doctor in the office for a follow-up appointment approximately 2-3 weeks after your surgery.  Make sure that you call for this appointment within a day or two after you arrive home to insure a convenient appointment time. 10. OTHER INSTRUCTIONS:  __________________________________________________________________________________________________________________________________________________________________________________________  WHEN TO CALL YOUR DOCTOR: 1. Fever over 101.0 2. Inability to urinate 3. Nausea and/or vomiting 4. Extreme swelling or bruising 5. Continued bleeding from incision. 6. Increased pain, redness, or drainage from the incision  The clinic staff is available to answer your questions during regular business hours.  Please don't hesitate to call and ask to speak to one of the nurses for clinical concerns.  If you have a medical emergency, go to the nearest emergency room or call 911.  A surgeon from [facility] is always on call at the hospital     [address], [address], [place], [place]  [address] ?  [address] [address], [place], [place]   [address] [phone] ? [phone] ? FAX [phone] Web site:  [email or web address]  

## 2015-12-29 NOTE — Care Management Note (Signed)
Case Management Note  Patient Details  Name: BUFFI WITKIN MRN: [id number] Date of Birth: 20-Mar-1971  Subjective/Objective:                    Action/Plan:  UR updated  Expected Discharge Date:                  Expected Discharge Plan:  Home/Self Care  In-House Referral:     Discharge planning Services     Post Acute Care Choice:    Choice offered to:     DME Arranged:    DME Agency:     HH Arranged:    [place] Agency:     Status of Service:  In process, will continue to follow  Medicare Important Message Given:  Yes Date Medicare IM Given:    Medicare IM give by:    Date Additional Medicare IM Given:    Additional Medicare Important Message give by:     If discussed at [place] of Stay Meetings, dates discussed:    Additional Comments:  Marilu Favre, RN 12/29/2015, 11:01 AM

## 2016-01-01 DIAGNOSIS — K529 Noninfective gastroenteritis and colitis, unspecified: Secondary | ICD-10-CM | POA: Diagnosis not present

## 2016-01-01 DIAGNOSIS — R1084 Generalized abdominal pain: Secondary | ICD-10-CM | POA: Diagnosis not present

## 2016-01-06 DIAGNOSIS — N200 Calculus of kidney: Secondary | ICD-10-CM | POA: Diagnosis not present

## 2016-01-06 DIAGNOSIS — R7309 Other abnormal glucose: Secondary | ICD-10-CM | POA: Diagnosis not present

## 2016-01-06 DIAGNOSIS — E559 Vitamin D deficiency, unspecified: Secondary | ICD-10-CM | POA: Diagnosis not present

## 2016-01-06 DIAGNOSIS — D509 Iron deficiency anemia, unspecified: Secondary | ICD-10-CM | POA: Diagnosis not present

## 2016-01-06 DIAGNOSIS — E785 Hyperlipidemia, unspecified: Secondary | ICD-10-CM | POA: Diagnosis not present

## 2016-01-06 DIAGNOSIS — E669 Obesity, unspecified: Secondary | ICD-10-CM | POA: Diagnosis not present

## 2016-01-06 DIAGNOSIS — F332 Major depressive disorder, recurrent severe without psychotic features: Secondary | ICD-10-CM | POA: Diagnosis not present

## 2016-01-06 DIAGNOSIS — G43109 Migraine with aura, not intractable, without status migrainosus: Secondary | ICD-10-CM | POA: Diagnosis not present

## 2016-01-08 DIAGNOSIS — Z79899 Other long term (current) drug therapy: Secondary | ICD-10-CM | POA: Diagnosis not present

## 2016-01-27 DIAGNOSIS — Z5181 Encounter for therapeutic drug level monitoring: Secondary | ICD-10-CM | POA: Diagnosis not present

## 2016-01-27 DIAGNOSIS — M722 Plantar fascial fibromatosis: Secondary | ICD-10-CM | POA: Diagnosis not present

## 2016-02-02 ENCOUNTER — Encounter (HOSPITAL_COMMUNITY): Payer: Self-pay | Admitting: *Deleted

## 2016-02-02 ENCOUNTER — Emergency Department (HOSPITAL_COMMUNITY)
Admission: EM | Admit: 2016-02-02 | Discharge: 2016-02-03 | Disposition: A | Discharge status: Other Acute Inpatient Hospital | Payer: Medicare Other | Attending: Emergency Medicine | Admitting: Emergency Medicine

## 2016-02-02 DIAGNOSIS — R4182 Altered mental status, unspecified: Secondary | ICD-10-CM | POA: Diagnosis not present

## 2016-02-02 DIAGNOSIS — Z79899 Other long term (current) drug therapy: Secondary | ICD-10-CM | POA: Diagnosis not present

## 2016-02-02 DIAGNOSIS — Z862 Personal history of diseases of the blood and blood-forming organs and certain disorders involving the immune mechanism: Secondary | ICD-10-CM | POA: Diagnosis not present

## 2016-02-02 DIAGNOSIS — I129 Hypertensive chronic kidney disease with stage 1 through stage 4 chronic kidney disease, or unspecified chronic kidney disease: Secondary | ICD-10-CM | POA: Diagnosis not present

## 2016-02-02 DIAGNOSIS — E785 Hyperlipidemia, unspecified: Secondary | ICD-10-CM | POA: Insufficient documentation

## 2016-02-02 DIAGNOSIS — R441 Visual hallucinations: Secondary | ICD-10-CM | POA: Diagnosis not present

## 2016-02-02 DIAGNOSIS — F1510 Other stimulant abuse, uncomplicated: Secondary | ICD-10-CM | POA: Insufficient documentation

## 2016-02-02 DIAGNOSIS — Z008 Encounter for other general examination: Secondary | ICD-10-CM | POA: Diagnosis not present

## 2016-02-02 DIAGNOSIS — N189 Chronic kidney disease, unspecified: Secondary | ICD-10-CM | POA: Insufficient documentation

## 2016-02-02 DIAGNOSIS — Z86711 Personal history of pulmonary embolism: Secondary | ICD-10-CM | POA: Insufficient documentation

## 2016-02-02 DIAGNOSIS — R440 Auditory hallucinations: Secondary | ICD-10-CM | POA: Insufficient documentation

## 2016-02-02 DIAGNOSIS — R011 Cardiac murmur, unspecified: Secondary | ICD-10-CM | POA: Insufficient documentation

## 2016-02-02 DIAGNOSIS — F419 Anxiety disorder, unspecified: Secondary | ICD-10-CM | POA: Diagnosis not present

## 2016-02-02 HISTORY — DX: Personality disorder, unspecified: F60.9

## 2016-02-02 LAB — COMPREHENSIVE METABOLIC PANEL
ALT: 15 U/L (ref 14–54)
AST: 20 U/L (ref 15–41)
Albumin: 4.1 g/dL (ref 3.5–5.0)
Alkaline Phosphatase: 93 U/L (ref 38–126)
Anion gap: 11 (ref 5–15)
BUN: 10 mg/dL (ref 6–20)
CO2: 22 mmol/L (ref 22–32)
Calcium: 9.6 mg/dL (ref 8.9–10.3)
Chloride: 106 mmol/L (ref 101–111)
Creatinine, Ser: 0.99 mg/dL (ref 0.44–1.00)
GFR calc Af Amer: >60 mL/min (ref >60)
GFR calc non Af Amer: >60 mL/min (ref >60)
Glucose, Bld: 111 mg/dL — ABNORMAL HIGH (ref 65–99)
Potassium: 4.2 mmol/L (ref 3.5–5.1)
Sodium: 139 mmol/L (ref 135–145)
Total Bilirubin: 0.6 mg/dL (ref 0.3–1.2)
Total Protein: 7 g/dL (ref 6.5–8.1)

## 2016-02-02 LAB — CBC
HCT: 41 % (ref 36.0–46.0)
Hemoglobin: 13.3 g/dL (ref 12.0–15.0)
MCH: 28.4 pg (ref 26.0–34.0)
MCHC: 32.4 g/dL (ref 30.0–36.0)
MCV: 87.4 fL (ref 78.0–100.0)
Platelets: 341 10*3/uL (ref 150–400)
RBC: 4.69 MIL/uL (ref 3.87–5.11)
RDW: 14.1 % (ref 11.5–15.5)
WBC: 8.7 10*3/uL (ref 4.0–10.5)

## 2016-02-02 LAB — I-STAT BETA HCG BLOOD, ED (MC, WL, AP ONLY): I-stat hCG, quantitative: <5 m[IU]/mL (ref <5)

## 2016-02-02 LAB — ETHANOL: Alcohol, Ethyl (B): <5 mg/dL (ref <5)

## 2016-02-02 LAB — SALICYLATE LEVEL: Salicylate Lvl: <4 mg/dL (ref 2.8–30.0)

## 2016-02-02 LAB — ACETAMINOPHEN LEVEL: Acetaminophen (Tylenol), Serum: <10 ug/mL — ABNORMAL LOW (ref 10–30)

## 2016-02-02 NOTE — Progress Notes (Addendum)
[name], NP recommends IP treatment.   Referrals have been sent to: [facility] - per [place], accepting referrals. [facility] - per [facility], beds open. [facility] - per Levada Dy, will look at it. [facility] - per [facility], fax it tonight, d/c in am on 2/07. [facility] - per Prudence Davidson, accepting for the waitlist.  Declined at: [facility] - due renal insufficiency/medical, per [place].  At capacity: [facility] - per [facility] - per [place] will continue to seek placement.  Verlon Setting, [place] Disposition staff 02/02/2016 6:43 PM

## 2016-02-02 NOTE — ED Provider Notes (Signed)
CSN: [id number]     Arrival date & time 02/02/16  1217 History   First MD Initiated Contact with Patient 02/02/16 1656     Chief Complaint  Patient presents with  . Altered Mental Status  . Medical Clearance   HPI  [name] [name] is a 45 y.o. female PMH significant for paranoid schizophrenia, personality disorder presenting with concern for medication compliance. Her caregiver, present at bedside, states patient has been having auditory and visual hallucinations, decreased food intake. She states patient tells her that she is petting and taking care of lemurs from [place]. The patient has been hearing that she is "fat" from an imaginary person named "[place]," and this is why she has not been eating. When asked about suicidal ideations, she states "I have done that in the past." She denies fevers, chills, headaches, chest pain, shortness of breath, abdominal pain, nausea, vomiting, change in bowel or bladder habits.  History obtained per caregiver as patient would not answer my questions, she would only answer them when caregiver repeated them to her.  Past Medical History  Diagnosis Date  . Heart murmur     asa child   . Anemia     hx of   . Depression   . Anxiety   . Anemia   . Iron deficiency anemia, unspecified 11/15/2013  . Iron deficiency anemia, unspecified 11/15/2013  . Hyperlipidemia   . Paranoid schizophrenia ([place])   . Hypertension     not on medication  . Hx pulmonary embolism 2013    after surgery from [place]  . Headache     migraines  . Blood dyscrasia     pt not aware  . Diarrhea, functional   . Diverticulitis   . Chronic kidney disease     kidney stone   . Chronic kidney disease   . Renal insufficiency   . Personality disorder    Past Surgical History  Procedure Laterality Date  . Other surgical history      cyst removed from ovary ? side   . Nephrolithotomy  03/31/2012    Procedure: NEPHROLITHOTOMY PERCUTANEOUS;  Surgeon: Claybon Jabs, MD;  Location: [facility];  Service: Urology;  Laterality: Left;  . Tubal ligation    . Laparotomy  07/31/2012    Procedure: EXPLORATORY LAPAROTOMY;  Surgeon: Harl Bowie, MD;  Location: [facility];  Service: General;  Laterality: N/A;  REPAIR OF PANCREATIC INJURY, EXPLORATION OF RETROPERITONEUM.  [name] Colostomy  07/31/2012    Procedure: COLOSTOMY;  Surgeon: Harl Bowie, MD;  Location: [facility];  Service: General;  Laterality: Right;  . Colostomy reversal    . Gws  2013    ABDOMINAL SURGERY  . Colostomy closure N/A 02/15/2013    Procedure: COLOSTOMY CLOSURE;  Surgeon: Gwenyth Ober, MD;  Location: [facility];  Service: General;  Laterality: N/A;  Reversal of colostomy  . Dilatation & curettage/hysteroscopy with trueclear N/A 10/24/2013    Procedure: DILATATION & CURETTAGE/HYSTEROSCOPY WITH TRUECLEAR, CERVICAL BLOCK;  Surgeon: Marylynn Pearson, MD;  Location: [facility];  Service: Gynecology;  Laterality: N/A;  . Breast reduction Left 06/2014  . Incisional hernia repair N/A 12/24/2015    Procedure:  INCISIONAL HERNIA REPAIR WITH MESH;  Surgeon: Coralie Keens, MD;  Location: [facility];  Service: General;  Laterality: N/A;  . Insertion of mesh N/A 12/24/2015    Procedure: INSERTION OF MESH;  Surgeon: Coralie Keens, MD;  Location: [facility];  Service: General;  Laterality: N/A;   Family History  Problem Relation Age of Onset  . Cancer Mother     kidney  . Depression Mother   . Other Neg Hx     adrenal problem   Social History  Substance Use Topics  . Smoking status: Never Smoker   . Smokeless tobacco: Never Used  . Alcohol Use: No   OB History    Gravida Para Term Preterm AB TAB SAB Ectopic Multiple Living   3 2 2  1  1   2      Review of Systems  Ten systems are reviewed and are negative for acute change except as noted in the HPI  Allergies  Augmentin; Enoxaparin; Avelox; and Sodium hydroxide  Home Medications   Prior to Admission medications   Medication Sig Start Date End Date Taking? Authorizing Provider   cloZAPine (CLOZARIL) 50 MG tablet Take 1 tablet (50 mg total) by mouth at bedtime. For mood control Patient taking differently: Take 25 mg by mouth at bedtime. For mood control 09/18/15   Encarnacion Slates, NP  FLUoxetine (PROZAC) 40 MG capsule Take 1 capsule (40 mg total) by mouth daily. For depression 09/18/15   Encarnacion Slates, NP  hydrOXYzine (ATARAX/VISTARIL) 25 MG tablet Take 1 tablet (25 mg total) by mouth every 6 (six) hours as needed for anxiety. Patient taking differently: Take 25 mg by mouth 4 (four) times daily.  09/18/15   Encarnacion Slates, NP  ibuprofen (ADVIL,MOTRIN) 200 MG tablet Take 800 mg by mouth every 6 (six) hours as needed (pain).     Historical Provider, MD  metFORMIN (GLUCOPHAGE) 500 MG tablet Take 1 tablet (500 mg total) by mouth daily with breakfast. For diabetes management Patient not taking: Reported on 12/15/2015 09/18/15   Encarnacion Slates, NP  Multiple Vitamin (MULTIVITAMIN WITH MINERALS) TABS tablet Take 1 tablet by mouth daily.    Historical Provider, MD  oxyCODONE-acetaminophen (ROXICET) 5-325 MG tablet Take 1-2 tablets by mouth every 4 (four) hours as needed for severe pain. 12/26/15   Coralie Keens, MD  paliperidone (INVEGA SUSTENNA) 156 MG/ML SUSP injection Inject 1 mL (156 mg total) into the muscle every 28 (twenty-eight) days. (Last dose given on 09-10-15): For mood control Patient taking differently: Inject 156 mg into the muscle every 28 (twenty-eight) days. Last dose early october 09/18/15   Encarnacion Slates, NP  traZODone (DESYREL) 100 MG tablet Take 200 mg by mouth at bedtime.    Historical Provider, MD  traZODone (DESYREL) 50 MG tablet Take 1 tablet (50 mg total) by mouth at bedtime as needed for sleep. Patient not taking: Reported on 12/15/2015 09/18/15   Encarnacion Slates, NP   BP 151/91 mmHg  Pulse 100  Resp 18  SpO2 97% Physical Exam  Constitutional: She appears well-developed and well-nourished. No distress.  HENT:  Head: Normocephalic and atraumatic.  Eyes:  Conjunctivae are normal. Right eye exhibits no discharge. Left eye exhibits no discharge. No scleral icterus.  Pulmonary/Chest: Effort normal. No respiratory distress.  Abdominal: She exhibits no distension.  Musculoskeletal: She exhibits no edema.  Neurological: She is alert. Coordination normal.  Skin: Skin is warm and dry. No rash noted. She is not diaphoretic. No erythema.  Psychiatric: She has a normal mood and affect. Her behavior is normal.  Nursing note and vitals reviewed. Patient would not allow me to touch her for a more thorough physical exam.  ED Course  Procedures  Labs Review Labs Reviewed  COMPREHENSIVE METABOLIC PANEL - Abnormal; Notable for the following:  Glucose, Bld 111 (*)    All other components within normal limits  ACETAMINOPHEN LEVEL - Abnormal; Notable for the following:    Acetaminophen (Tylenol), Serum <10 (*)    All other components within normal limits  URINE RAPID DRUG SCREEN, HOSP PERFORMED - Abnormal; Notable for the following:    Amphetamines POSITIVE (*)    All other components within normal limits  ETHANOL  SALICYLATE LEVEL  CBC  I-STAT BETA HCG BLOOD, ED ([facility], [facility], [facility] ONLY)    MDM   Final diagnoses:  None   Patient is medically cleared at this time. Behavioral Health advised inpatient admission. Psych orders placed. Patient may be safely transferred. Patient caregiver and understanding and agreement with the plan.  Patient spotted translator emergency department. IVC papers were placed and filed. Per Education officer, museum, bed is available at [facility]; however, [facility] is requesting patient receive her Lorayne Bender. Patient informed me that she takes paliperidone (INVEGA SUSTENNA) 156 MG/ML SUSP injection and Paliperidone Palmitate (INVEGA TRINZA IM), both of which she was given the first of this month. Additional medications are not indicated at this time, as this is a once a month/ every 3 months regimen.  Bedtime history medications were  ordered. Paged social work and TTS to get an update on patient status but I have not heard back at this time.  UDS is positive for amphetamines CMP, EtOH, salicylate, acetaminophen, CBC, hCG unremarkable. Care hand off to Dr. Dina Rich at shift change.  [place], PA-C 02/03/16 [place], MD 02/03/16 [phone]

## 2016-02-02 NOTE — Progress Notes (Signed)
Writer spoke with PA-C [place] and was advised that patient has been getting the INVEGA injections.  Awaiting note in epic from [place] Lions so that this Probation officer can fax it to [place] at [facility].  [place] at [facility] aware and stated that: "As soon as that note comes through, I will be giving you a call."   Verlon Setting, [place] Disposition staff 02/02/2016 10:07 PM

## 2016-02-02 NOTE — ED Notes (Signed)
Contacted security to wand patient

## 2016-02-02 NOTE — ED Notes (Signed)
PA Riley at bedside.  

## 2016-02-02 NOTE — ED Notes (Signed)
Upon attempting to hook patient up to bp and pulse ox monitor, patient appeared frightened and refused monitoring.

## 2016-02-02 NOTE — ED Notes (Signed)
Called staffing to request sitter for patient

## 2016-02-02 NOTE — Progress Notes (Signed)
Per [place] at [facility], patient declined due renal insufficiency/medical.  Verlon Setting, [place] Disposition staff 02/02/2016 11:30 PM

## 2016-02-02 NOTE — ED Notes (Signed)
Per caregiver, pt has psych history and home health visits, last seen on Thursday. Today member of act team went to visit and pt did not recognize her. Pt is having hallucinations and whispering to herself at triage. Pt very restless and easily agitated at triage.

## 2016-02-02 NOTE — ED Notes (Signed)
Tech pulled to sit with patient until staffing sitter arrives.  Pt tried to walk out the door was able to be coaxed back to room.  Security called, is waiting nearby.  Pt unable to be wanded due to reaction when males enter room.

## 2016-02-02 NOTE — Progress Notes (Addendum)
[place] at [facility] inquired if patient can be given Invega TRINZA IM and INVEGA SUSTENNA while patient is at [facility].  Per [place] at [facility], if these medications can be administered in the [facility], then patient will be will be considered for admission to [facility].  Per [facility] RN Leodis Sias, attending physician will be Merchant navy officer with regards to the request for medications.  Verlon Setting, [place] Disposition staff 02/02/2016 8:22 PM
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
## 2016-02-03 DIAGNOSIS — R4182 Altered mental status, unspecified: Secondary | ICD-10-CM | POA: Diagnosis not present

## 2016-02-03 DIAGNOSIS — I10 Essential (primary) hypertension: Secondary | ICD-10-CM | POA: Diagnosis present

## 2016-02-03 DIAGNOSIS — F209 Schizophrenia, unspecified: Secondary | ICD-10-CM | POA: Diagnosis not present

## 2016-02-03 DIAGNOSIS — R45851 Suicidal ideations: Secondary | ICD-10-CM | POA: Diagnosis present

## 2016-02-03 DIAGNOSIS — K5792 Diverticulitis of intestine, part unspecified, without perforation or abscess without bleeding: Secondary | ICD-10-CM | POA: Diagnosis present

## 2016-02-03 DIAGNOSIS — F250 Schizoaffective disorder, bipolar type: Secondary | ICD-10-CM | POA: Diagnosis present

## 2016-02-03 DIAGNOSIS — K219 Gastro-esophageal reflux disease without esophagitis: Secondary | ICD-10-CM | POA: Diagnosis present

## 2016-02-03 DIAGNOSIS — E7800 Pure hypercholesterolemia, unspecified: Secondary | ICD-10-CM | POA: Diagnosis present

## 2016-02-03 LAB — CBC WITH DIFFERENTIAL/PLATELET
Basophils Absolute: 0 10*3/uL (ref 0.0–0.1)
Basophils Relative: 1 %
Eosinophils Absolute: 0 10*3/uL (ref 0.0–0.7)
Eosinophils Relative: 1 %
HCT: 39.2 % (ref 36.0–46.0)
Hemoglobin: 12.2 g/dL (ref 12.0–15.0)
Lymphocytes Relative: 20 %
Lymphs Abs: 1.3 10*3/uL (ref 0.7–4.0)
MCH: 27.4 pg (ref 26.0–34.0)
MCHC: 31.1 g/dL (ref 30.0–36.0)
MCV: 87.9 fL (ref 78.0–100.0)
Monocytes Absolute: 0.5 10*3/uL (ref 0.1–1.0)
Monocytes Relative: 8 %
Neutro Abs: 4.5 10*3/uL (ref 1.7–7.7)
Neutrophils Relative %: 70 %
Platelets: 307 10*3/uL (ref 150–400)
RBC: 4.46 MIL/uL (ref 3.87–5.11)
RDW: 13.9 % (ref 11.5–15.5)
WBC: 6.3 10*3/uL (ref 4.0–10.5)

## 2016-02-03 LAB — RAPID URINE DRUG SCREEN, HOSP PERFORMED
Amphetamines: POSITIVE — AB
Barbiturates: NOT DETECTED
Benzodiazepines: NOT DETECTED
Cocaine: NOT DETECTED
Opiates: NOT DETECTED
Tetrahydrocannabinol: NOT DETECTED

## 2016-02-03 MED ORDER — MELOXICAM 15 MG PO TABS
15.0000 mg | ORAL_TABLET | Freq: Every day | ORAL | Status: DC
  Administered 2016-02-03: 15 mg via ORAL
  Filled 2016-02-03: qty 1

## 2016-02-03 MED ORDER — TRAZODONE HCL 100 MG PO TABS
100.0000 mg | ORAL_TABLET | Freq: Every day | ORAL | Status: DC
  Administered 2016-02-03: 100 mg via ORAL
  Filled 2016-02-03: qty 1

## 2016-02-03 MED ORDER — TRAZODONE HCL 50 MG PO TABS: 50.0000 mg | ORAL_TABLET | Freq: Every evening | ORAL | Status: DC | PRN

## 2016-02-03 MED ORDER — HYDROXYZINE HCL 25 MG PO TABS
25.0000 mg | ORAL_TABLET | Freq: Four times a day (QID) | ORAL | Status: DC
  Administered 2016-02-03 (×2): 25 mg via ORAL
  Filled 2016-02-03 (×3): qty 1

## 2016-02-03 MED ORDER — CLOZAPINE 25 MG PO TABS: 25.0000 mg | ORAL_TABLET | Freq: Every day | ORAL | Status: DC

## 2016-02-03 MED ORDER — FLUOXETINE HCL 20 MG PO CAPS
40.0000 mg | ORAL_CAPSULE | Freq: Every day | ORAL | Status: DC
  Administered 2016-02-03: 40 mg via ORAL
  Filled 2016-02-03: qty 2

## 2016-02-03 MED ORDER — CLOZAPINE 25 MG PO TABS
25.0000 mg | ORAL_TABLET | Freq: Every day | ORAL | Status: DC
  Filled 2016-02-03: qty 1

## 2016-02-03 NOTE — ED Notes (Signed)
Dr Canary Brim aware pt asking for home meds.

## 2016-02-03 NOTE — ED Notes (Signed)
Per Jerrol Banana, pt accepted to [facility] [place] - Dr Jackolyn Confer - [phone].

## 2016-02-03 NOTE — Progress Notes (Signed)
Patient under review for admission at the following for inpatient psychiatric treatment: [name] [name]- per Jeanelle Malling- per Dewitt Hoes- per Marcos Eke at [facility] due to hx of renal insufficiency.  Also considered for admission to [facility] and [facility] upon appropriate bed availability.  Sharren Bridge, MSW, LCSW Clinical Social Work, Disposition  02/03/2016 [phone]

## 2016-02-03 NOTE — ED Notes (Signed)
[facility] Called and advised is evaluating chart.

## 2016-02-03 NOTE — ED Notes (Signed)
Pt called daughter to advise of pending transport to [facility].

## 2016-02-03 NOTE — ED Notes (Signed)
Pt to move to C20 at 0300

## 2016-02-03 NOTE — Progress Notes (Signed)
Spoke with pt's ACTT clinician Amy [phone] ([facility])- ROI on file in [facility]. Amy reports that pt began decompensating last week, states, "Pt came to group therapy and divulged that she's been unable to sleep. That has historically been a huge trigger for her to decompensate into psychosis." She states pt "has been doing very well these past few months since her last hospitalization at [facility]. She got out of the toxic relationship with her mother and moved in with her daughter. She has been very encouraged to be able to work on mending her relationship with her daughter. She's been doing great with taking medications, coming to all her appointments, taking initiative in her own personal life and to independently apply for food stamps and things like that. She's become more self-sufficient and motivated." Amy states, "It seems that Zarina's psychotic episodes follow the resolution of a crisis in her life. Last summer it was following her divorce, and this time it is following the chaos of her moving out of her mother's home and in with her daughter. It seems like when things are finally peaceful for her is when she falls apart and starts talking more about [place] (name pt calls her auditory hallucinations."   Amy states pt last received her Rolland Porter (3 month) shot Thursday 01/29/16. CSW made note that pt's UDS from today is positive for amphetamines, and Amy reviewed pt's current medications from last MD appt 01/26/16 to make sure no stimulants were prescribed- did not note any. Makes note that they will be looking into possible substance use on pt's part. She states she will fax medication record to writer so that most recent medications can be kept on file.   Amy aware inpatient stabilization has been recommended and that placement is sought at [facility] or elsewhere. She requests to be kept informed of progress so that ACTT can follow up with plan of care.   Sharren Bridge, MSW, LCSW Clinical Social  Work, Disposition  02/03/2016 [phone]

## 2016-02-03 NOTE — ED Notes (Signed)
Wink, RN - [facility]. Pt on her way via [facility].

## 2016-02-03 NOTE — ED Notes (Signed)
Patient was given a snack and drink, A regular diet ordered for lunch. 

## 2016-02-21 DIAGNOSIS — J029 Acute pharyngitis, unspecified: Secondary | ICD-10-CM | POA: Diagnosis not present

## 2016-03-23 DIAGNOSIS — R002 Palpitations: Secondary | ICD-10-CM | POA: Diagnosis not present

## 2016-03-23 DIAGNOSIS — F419 Anxiety disorder, unspecified: Secondary | ICD-10-CM | POA: Diagnosis not present

## 2016-03-23 DIAGNOSIS — R0789 Other chest pain: Secondary | ICD-10-CM | POA: Diagnosis not present

## 2016-03-23 DIAGNOSIS — R0602 Shortness of breath: Secondary | ICD-10-CM | POA: Diagnosis not present

## 2016-03-25 DIAGNOSIS — F5000 Anorexia nervosa, unspecified: Secondary | ICD-10-CM | POA: Diagnosis not present

## 2016-03-25 DIAGNOSIS — F258 Other schizoaffective disorders: Secondary | ICD-10-CM | POA: Diagnosis not present

## 2016-03-25 DIAGNOSIS — E119 Type 2 diabetes mellitus without complications: Secondary | ICD-10-CM | POA: Diagnosis not present

## 2016-03-25 DIAGNOSIS — F4310 Post-traumatic stress disorder, unspecified: Secondary | ICD-10-CM | POA: Diagnosis present

## 2016-03-25 DIAGNOSIS — R45851 Suicidal ideations: Secondary | ICD-10-CM | POA: Diagnosis not present

## 2016-03-25 DIAGNOSIS — Z79899 Other long term (current) drug therapy: Secondary | ICD-10-CM | POA: Diagnosis not present

## 2016-03-25 DIAGNOSIS — F603 Borderline personality disorder: Secondary | ICD-10-CM | POA: Diagnosis not present

## 2016-03-25 DIAGNOSIS — F250 Schizoaffective disorder, bipolar type: Secondary | ICD-10-CM | POA: Diagnosis not present

## 2016-03-25 DIAGNOSIS — I10 Essential (primary) hypertension: Secondary | ICD-10-CM | POA: Diagnosis present

## 2016-03-25 DIAGNOSIS — Z881 Allergy status to other antibiotic agents status: Secondary | ICD-10-CM | POA: Diagnosis not present

## 2016-03-25 DIAGNOSIS — F329 Major depressive disorder, single episode, unspecified: Secondary | ICD-10-CM | POA: Diagnosis not present

## 2016-03-25 DIAGNOSIS — F609 Personality disorder, unspecified: Secondary | ICD-10-CM | POA: Diagnosis not present

## 2016-03-25 DIAGNOSIS — F259 Schizoaffective disorder, unspecified: Secondary | ICD-10-CM | POA: Diagnosis not present

## 2016-03-25 DIAGNOSIS — F419 Anxiety disorder, unspecified: Secondary | ICD-10-CM | POA: Diagnosis not present

## 2016-03-25 DIAGNOSIS — Z7984 Long term (current) use of oral hypoglycemic drugs: Secondary | ICD-10-CM | POA: Diagnosis not present

## 2016-03-25 DIAGNOSIS — Z880 Allergy status to penicillin: Secondary | ICD-10-CM | POA: Diagnosis not present

## 2016-03-25 DIAGNOSIS — E118 Type 2 diabetes mellitus with unspecified complications: Secondary | ICD-10-CM | POA: Diagnosis not present

## 2016-03-25 DIAGNOSIS — R440 Auditory hallucinations: Secondary | ICD-10-CM | POA: Diagnosis not present

## 2016-03-30 ENCOUNTER — Observation Stay (HOSPITAL_COMMUNITY): Payer: Medicare Other

## 2016-03-30 ENCOUNTER — Encounter (HOSPITAL_COMMUNITY): Payer: Self-pay

## 2016-03-30 ENCOUNTER — Observation Stay (HOSPITAL_COMMUNITY)
Admission: EM | Admit: 2016-03-30 | Discharge: 2016-03-31 | Discharge status: Against Medical Advice | Payer: Medicare Other | Attending: Internal Medicine | Admitting: Internal Medicine

## 2016-03-30 ENCOUNTER — Emergency Department (HOSPITAL_COMMUNITY): Payer: Medicare Other

## 2016-03-30 DIAGNOSIS — I129 Hypertensive chronic kidney disease with stage 1 through stage 4 chronic kidney disease, or unspecified chronic kidney disease: Secondary | ICD-10-CM | POA: Insufficient documentation

## 2016-03-30 DIAGNOSIS — G2579 Other drug induced movement disorders: Secondary | ICD-10-CM

## 2016-03-30 DIAGNOSIS — R51 Headache: Secondary | ICD-10-CM | POA: Diagnosis not present

## 2016-03-30 DIAGNOSIS — IMO0001 Reserved for inherently not codable concepts without codable children: Secondary | ICD-10-CM

## 2016-03-30 DIAGNOSIS — Z791 Long term (current) use of non-steroidal anti-inflammatories (NSAID): Secondary | ICD-10-CM | POA: Insufficient documentation

## 2016-03-30 DIAGNOSIS — N189 Chronic kidney disease, unspecified: Secondary | ICD-10-CM | POA: Diagnosis not present

## 2016-03-30 DIAGNOSIS — R232 Flushing: Secondary | ICD-10-CM | POA: Diagnosis present

## 2016-03-30 DIAGNOSIS — F250 Schizoaffective disorder, bipolar type: Secondary | ICD-10-CM | POA: Insufficient documentation

## 2016-03-30 DIAGNOSIS — Z5321 Procedure and treatment not carried out due to patient leaving prior to being seen by health care provider: Secondary | ICD-10-CM | POA: Insufficient documentation

## 2016-03-30 DIAGNOSIS — I10 Essential (primary) hypertension: Secondary | ICD-10-CM | POA: Diagnosis present

## 2016-03-30 DIAGNOSIS — Z86711 Personal history of pulmonary embolism: Secondary | ICD-10-CM | POA: Diagnosis not present

## 2016-03-30 DIAGNOSIS — R072 Precordial pain: Secondary | ICD-10-CM | POA: Diagnosis not present

## 2016-03-30 DIAGNOSIS — F419 Anxiety disorder, unspecified: Secondary | ICD-10-CM | POA: Insufficient documentation

## 2016-03-30 DIAGNOSIS — T43595A Adverse effect of other antipsychotics and neuroleptics, initial encounter: Secondary | ICD-10-CM | POA: Diagnosis not present

## 2016-03-30 DIAGNOSIS — R251 Tremor, unspecified: Secondary | ICD-10-CM | POA: Diagnosis not present

## 2016-03-30 DIAGNOSIS — R531 Weakness: Secondary | ICD-10-CM | POA: Diagnosis not present

## 2016-03-30 DIAGNOSIS — F259 Schizoaffective disorder, unspecified: Secondary | ICD-10-CM | POA: Diagnosis present

## 2016-03-30 DIAGNOSIS — R030 Elevated blood-pressure reading, without diagnosis of hypertension: Secondary | ICD-10-CM

## 2016-03-30 DIAGNOSIS — Z79899 Other long term (current) drug therapy: Secondary | ICD-10-CM | POA: Diagnosis not present

## 2016-03-30 DIAGNOSIS — E119 Type 2 diabetes mellitus without complications: Secondary | ICD-10-CM | POA: Diagnosis not present

## 2016-03-30 DIAGNOSIS — G9081 Serotonin syndrome: Secondary | ICD-10-CM | POA: Diagnosis present

## 2016-03-30 DIAGNOSIS — D72829 Elevated white blood cell count, unspecified: Secondary | ICD-10-CM

## 2016-03-30 DIAGNOSIS — R404 Transient alteration of awareness: Secondary | ICD-10-CM | POA: Diagnosis not present

## 2016-03-30 DIAGNOSIS — R000 Tachycardia, unspecified: Secondary | ICD-10-CM | POA: Insufficient documentation

## 2016-03-30 DIAGNOSIS — R519 Headache, unspecified: Secondary | ICD-10-CM

## 2016-03-30 DIAGNOSIS — F332 Major depressive disorder, recurrent severe without psychotic features: Secondary | ICD-10-CM | POA: Diagnosis not present

## 2016-03-30 LAB — CBC WITH DIFFERENTIAL/PLATELET
Basophils Absolute: 0.1 10*3/uL (ref 0.0–0.1)
Basophils Relative: 0 %
EOS ABS: 0.1 10*3/uL (ref 0.0–0.7)
EOS PCT: 1 %
HCT: 42.4 % (ref 36.0–46.0)
HEMOGLOBIN: 14 g/dL (ref 12.0–15.0)
LYMPHS ABS: 1.9 10*3/uL (ref 0.7–4.0)
Lymphocytes Relative: 16 %
MCH: 28.4 pg (ref 26.0–34.0)
MCHC: 33 g/dL (ref 30.0–36.0)
MCV: 86 fL (ref 78.0–100.0)
MONO ABS: 0.9 10*3/uL (ref 0.1–1.0)
MONOS PCT: 8 %
NEUTROS PCT: 75 %
Neutro Abs: 8.6 10*3/uL — ABNORMAL HIGH (ref 1.7–7.7)
Platelets: 404 10*3/uL — ABNORMAL HIGH (ref 150–400)
RBC: 4.93 MIL/uL (ref 3.87–5.11)
RDW: 14.1 % (ref 11.5–15.5)
WBC: 11.5 10*3/uL — ABNORMAL HIGH (ref 4.0–10.5)

## 2016-03-30 LAB — I-STAT BETA HCG BLOOD, ED (MC, WL, AP ONLY)

## 2016-03-30 LAB — URINALYSIS, ROUTINE W REFLEX MICROSCOPIC
BILIRUBIN URINE: NEGATIVE
Glucose, UA: NEGATIVE mg/dL
Hgb urine dipstick: NEGATIVE
Ketones, ur: NEGATIVE mg/dL
Leukocytes, UA: NEGATIVE
NITRITE: NEGATIVE
PH: 6.5 (ref 5.0–8.0)
PROTEIN: NEGATIVE mg/dL
Specific Gravity, Urine: 1.02 (ref 1.005–1.030)

## 2016-03-30 LAB — I-STAT TROPONIN, ED: Troponin i, poc: 0 ng/mL (ref 0.00–0.08)

## 2016-03-30 LAB — RAPID URINE DRUG SCREEN, HOSP PERFORMED
AMPHETAMINES: NOT DETECTED
Barbiturates: NOT DETECTED
Benzodiazepines: POSITIVE — AB
Cocaine: NOT DETECTED
Opiates: NOT DETECTED
Tetrahydrocannabinol: NOT DETECTED

## 2016-03-30 LAB — COMPREHENSIVE METABOLIC PANEL
ALK PHOS: 101 U/L (ref 38–126)
ALT: 19 U/L (ref 14–54)
ANION GAP: 10 (ref 5–15)
AST: 28 U/L (ref 15–41)
Albumin: 4.3 g/dL (ref 3.5–5.0)
BILIRUBIN TOTAL: 0.5 mg/dL (ref 0.3–1.2)
BUN: 12 mg/dL (ref 6–20)
CALCIUM: 9.5 mg/dL (ref 8.9–10.3)
CO2: 23 mmol/L (ref 22–32)
Chloride: 109 mmol/L (ref 101–111)
Creatinine, Ser: 0.93 mg/dL (ref 0.44–1.00)
GFR calc non Af Amer: >60 mL/min (ref >60)
Glucose, Bld: 117 mg/dL — ABNORMAL HIGH (ref 65–99)
Potassium: 4.4 mmol/L (ref 3.5–5.1)
SODIUM: 142 mmol/L (ref 135–145)
TOTAL PROTEIN: 7.6 g/dL (ref 6.5–8.1)

## 2016-03-30 LAB — CK: CK TOTAL: 39 U/L (ref 38–234)

## 2016-03-30 LAB — LACTIC ACID, PLASMA: LACTIC ACID, VENOUS: 1.4 mmol/L (ref 0.5–2.0)

## 2016-03-30 LAB — LACTATE DEHYDROGENASE: LDH: 94 U/L — AB (ref 98–192)

## 2016-03-30 LAB — TROPONIN I

## 2016-03-30 LAB — LIPASE, BLOOD: Lipase: 31 U/L (ref 11–51)

## 2016-03-30 LAB — TSH: TSH: 0.907 u[IU]/mL (ref 0.350–4.500)

## 2016-03-30 MED ORDER — LORAZEPAM 1 MG PO TABS
1.0000 mg | ORAL_TABLET | Freq: Once | ORAL | Status: AC
  Administered 2016-03-30: 1 mg via ORAL
  Filled 2016-03-30: qty 1

## 2016-03-30 MED ORDER — MELOXICAM 15 MG PO TABS
15.0000 mg | ORAL_TABLET | Freq: Every day | ORAL | Status: DC | PRN
Start: 2016-03-30 — End: 2016-03-31
  Filled 2016-03-30: qty 1

## 2016-03-30 MED ORDER — ACETAMINOPHEN 650 MG RE SUPP: 650.0000 mg | Freq: Four times a day (QID) | RECTAL | Status: DC | PRN

## 2016-03-30 MED ORDER — HYDROXYZINE HCL 25 MG PO TABS
25.0000 mg | ORAL_TABLET | Freq: Four times a day (QID) | ORAL | Status: DC
Start: 2016-03-30 — End: 2016-03-31
  Administered 2016-03-30 – 2016-03-31 (×3): 25 mg via ORAL
  Filled 2016-03-30 (×4): qty 1

## 2016-03-30 MED ORDER — SODIUM CHLORIDE 0.9 % IV BOLUS (SEPSIS)
1000.0000 mL | Freq: Once | INTRAVENOUS | Status: AC
  Administered 2016-03-30: 1000 mL via INTRAVENOUS

## 2016-03-30 MED ORDER — ONDANSETRON HCL 4 MG/2ML IJ SOLN: 4.0000 mg | Freq: Four times a day (QID) | INTRAMUSCULAR | Status: DC | PRN

## 2016-03-30 MED ORDER — TEMAZEPAM 15 MG PO CAPS: 30.0000 mg | ORAL_CAPSULE | Freq: Once | ORAL | Status: DC

## 2016-03-30 MED ORDER — MELATONIN 10 MG PO TABS: 1.0000 | ORAL_TABLET | Freq: Every day | ORAL | Status: DC

## 2016-03-30 MED ORDER — LORAZEPAM 2 MG/ML IJ SOLN
1.0000 mg | Freq: Once | INTRAMUSCULAR | Status: AC
  Administered 2016-03-30: 1 mg via INTRAVENOUS
  Filled 2016-03-30: qty 1

## 2016-03-30 MED ORDER — ACETAMINOPHEN 325 MG PO TABS
650.0000 mg | ORAL_TABLET | Freq: Four times a day (QID) | ORAL | Status: DC | PRN
  Administered 2016-03-30: 650 mg via ORAL
  Filled 2016-03-30: qty 2

## 2016-03-30 MED ORDER — HYDRALAZINE HCL 20 MG/ML IJ SOLN: 5.0000 mg | Freq: Four times a day (QID) | INTRAMUSCULAR | Status: DC | PRN

## 2016-03-30 MED ORDER — TEMAZEPAM 15 MG PO CAPS: 15.0000 mg | ORAL_CAPSULE | Freq: Every evening | ORAL | Status: DC | PRN

## 2016-03-30 MED ORDER — GI COCKTAIL ~~LOC~~
30.0000 mL | Freq: Once | ORAL | Status: AC
  Administered 2016-03-30: 30 mL via ORAL
  Filled 2016-03-30: qty 30

## 2016-03-30 MED ORDER — TRAZODONE HCL 100 MG PO TABS: 200.0000 mg | ORAL_TABLET | Freq: Every day | ORAL | Status: DC

## 2016-03-30 MED ORDER — TEMAZEPAM 7.5 MG PO CAPS: 7.5000 mg | ORAL_CAPSULE | Freq: Every evening | ORAL | Status: DC | PRN

## 2016-03-30 MED ORDER — TEMAZEPAM 7.5 MG PO CAPS
7.5000 mg | ORAL_CAPSULE | Freq: Every evening | ORAL | Status: DC | PRN
  Filled 2016-03-30: qty 1

## 2016-03-30 NOTE — ED Notes (Addendum)
Per EMS-states she was at MD office for [facility] administration she became tachy, hypertensive and now vomiting

## 2016-03-30 NOTE — ED Notes (Signed)
MD at bedside. ADMITTING MD PRESENT EVALUATING PT. AWARE OF BP AND VITALS.

## 2016-03-30 NOTE — H&P (Signed)
History and Physical  [name] [name] [id number] DOB: 11/18/1971 DOA: 03/30/2016   PCP: Jonathon Bellows, MD  Referring Physician: ED/ Dr. Billy Fischer  Chief Complaint: N/V, flushing  HPI:  45 year old female with a history of anxiety, depression, schizoaffective disorder presented with 1 day history of nausea, vomiting, tremors, and flushing.  The patient was recently discharged from behavioral health Hospital in [place] on 03/28/2016. She was admitted at that time because of increasing anxiety, worsening depression, and paranoid thoughts, and passive suicidal ideation. During that behavioral health hospitalization, BuSpar was added to the patient's psychiatric regimen. She was continued on her fluoxetine, trazodone. Review of the medical record also shows that there was when necessary orders for Geodon and Haldol, but it is unclear whether the patient actually received any of these medications. The patient was discharged on 03/28/2016 after a three-day stay, and she was given a prescription for BuSpar. Her last dose of BuSpar was in the morning of 03/28/2016. She has not filled the prescription.  Her last Invega dose was 02/22/16.  On the morning of 03/30/2016, the patient presented toher primary care doctor with the above symptoms. There was concern that she may have been developing neuroleptic malignant syndrome. As result, she was sent to the emergency department for further evaluation.  At the time of my evaluation, pt denies fevers, chills, headache, chest pain, dyspnea, nausea, vomiting, diarrhea, abdominal pain, dysuria, hematuria   In the emergency department, the patient was noted to be afebrile. She was tachycardic initially with heart rate in the 130s and hypertensive with blood pressure 189/108. BMP and CBC were essentially unremarkable except for WBC 11.5. The patient was given Ativan 1 mg IV, GI cocktail, and 1 L fluid  bolus. Assessment/Plan: Flushing/tremors -Concerns for a serotonin syndrome versus neuroleptic malignant syndrome -Clinical suspicion is low given that the patient has no mental status change, no rigidity, no hyperthermia -however, illicit drugs including cocaine or methamphetamines may cause similar symptoms--patient had positive amphetamines in her urine 02/03/2016 -Check urine drug screen -Check CPK -Check LDH -Admit to telemetry bed -hold agents that may cause a serotonin surge including BuSpar, fluoxetine, trazodone -I have consulted psychiatry already--to see pt on 4/5 -Use Restoril for prn sleep -TSH Atypical chest pain -Although the patient did not complain of chest discomfort to me, she had complained of chest discomfort and burning to the emergency department -Cycle troponins -EKG without concerning ischemic changes -chest discomfort improved with GI cocktail Nausea/vomiting -No vomiting since admission to the ED -LFTs normal -Check lipase Elevated Blood Pressure -I question true accuracy of BPs in ED -while pt may have certainly had elevated BP in ED, the BP was obtained from forearm and wrist which may elevated BPs factitiously -at time of my exam BP 129/87--but may have been influenced by recent ativan prior to my arrival History of prolonged QT -QTC 466msec on the EKG in ED -monitor on tele -repeat EKG in am Occipital Headache -CT brain Hyperglycemia -Hemoglobin A1c Leukocytosis -Likely stress demargination -check lactic acid -UA and urine culture -Chest x-ray negative       Past Medical History  Diagnosis Date  . Heart murmur     asa child   . Anemia     hx of   . Depression   . Anxiety   . Anemia   . Iron deficiency anemia, unspecified 11/15/2013  . Iron deficiency anemia, unspecified 11/15/2013  . Hyperlipidemia   . Paranoid schizophrenia ([place])   .  Hypertension     not on medication  . Hx pulmonary embolism 2013    after surgery from [place]   . Headache     migraines  . Blood dyscrasia     pt not aware  . Diarrhea, functional   . Diverticulitis   . Chronic kidney disease     kidney stone   . Chronic kidney disease   . Renal insufficiency   . Personality disorder    Past Surgical History  Procedure Laterality Date  . Other surgical history      cyst removed from ovary ? side   . Nephrolithotomy  03/31/2012    Procedure: NEPHROLITHOTOMY PERCUTANEOUS;  Surgeon: Claybon Jabs, MD;  Location: [facility];  Service: Urology;  Laterality: Left;  . Tubal ligation    . Laparotomy  07/31/2012    Procedure: EXPLORATORY LAPAROTOMY;  Surgeon: Harl Bowie, MD;  Location: [facility];  Service: General;  Laterality: N/A;  REPAIR OF PANCREATIC INJURY, EXPLORATION OF RETROPERITONEUM.  [name] Colostomy  07/31/2012    Procedure: COLOSTOMY;  Surgeon: Harl Bowie, MD;  Location: [facility];  Service: General;  Laterality: Right;  . Colostomy reversal    . [facility]  2013    ABDOMINAL SURGERY  . Colostomy closure N/A 02/15/2013    Procedure: COLOSTOMY CLOSURE;  Surgeon: Gwenyth Ober, MD;  Location: [facility];  Service: General;  Laterality: N/A;  Reversal of colostomy  . Dilatation & curettage/hysteroscopy with trueclear N/A 10/24/2013    Procedure: DILATATION & CURETTAGE/HYSTEROSCOPY WITH TRUECLEAR, CERVICAL BLOCK;  Surgeon: Marylynn Pearson, MD;  Location: [facility];  Service: Gynecology;  Laterality: N/A;  . Breast reduction Left 06/2014  . Incisional hernia repair N/A 12/24/2015    Procedure:  INCISIONAL HERNIA REPAIR WITH MESH;  Surgeon: Coralie Keens, MD;  Location: [facility];  Service: General;  Laterality: N/A;  . Insertion of mesh N/A 12/24/2015    Procedure: INSERTION OF MESH;  Surgeon: Coralie Keens, MD;  Location: [facility];  Service: General;  Laterality: N/A;   Social History:  reports that she has never smoked. She has never used smokeless tobacco. She reports that she does not drink alcohol or use illicit drugs.   Family History  Problem Relation  Age of Onset  . Cancer Mother     kidney  . Depression Mother   . Other Neg Hx     adrenal problem     Allergies  Allergen Reactions  . Augmentin [Amoxicillin-Pot Clavulanate] Itching, Swelling and Rash    Has patient had a PCN reaction causing immediate rash, facial/tongue/throat swelling, SOB or lightheadedness with hypotension: Yes Has patient had a PCN reaction causing severe rash involving mucus membranes or skin necrosis: No Has patient had a PCN reaction that required hospitalization No Has patient had a PCN reaction occurring within the last 10 years: Yes If all of the above answers are "NO", then may proceed with Cephalosporin use.  . Enoxaparin Hives  . Avelox [Moxifloxacin Hcl In Nacl] Itching, Swelling and Rash  . Sodium Hydroxide Rash      Prior to Admission medications   Medication Sig Start Date End Date Taking? Authorizing Provider  busPIRone (BUSPAR) 10 MG tablet Take 10 mg by mouth 3 (three) times daily.  03/28/16 04/27/16 Yes Historical Provider, MD  FLUoxetine (PROZAC) 40 MG capsule Take 1 capsule (40 mg total) by mouth daily. For depression 09/18/15  Yes Encarnacion Slates, NP  hydrOXYzine (ATARAX/VISTARIL) 25 MG tablet Take 1 tablet (25 mg  total) by mouth every 6 (six) hours as needed for anxiety. Patient taking differently: Take 25 mg by mouth 4 (four) times daily.  09/18/15  Yes Encarnacion Slates, NP  Melatonin 10 MG TABS Take 1 tablet by mouth at bedtime.   Yes Historical Provider, MD  meloxicam (MOBIC) 15 MG tablet Take 15 mg by mouth daily as needed for pain.  01/27/16  Yes Historical Provider, MD  traZODone (DESYREL) 100 MG tablet Take 100-200 mg by mouth at bedtime as needed for sleep.    Yes Historical Provider, MD  traZODone (DESYREL) 50 MG tablet Take 1 tablet (50 mg total) by mouth at bedtime as needed for sleep. Patient taking differently: Take 50 mg by mouth at bedtime.  09/18/15  Yes Encarnacion Slates, NP  cloZAPine (CLOZARIL) 50 MG tablet Take 1 tablet (50 mg total)  by mouth at bedtime. For mood control Patient not taking: Reported on 03/30/2016 09/18/15   Encarnacion Slates, NP  metFORMIN (GLUCOPHAGE) 500 MG tablet Take 1 tablet (500 mg total) by mouth daily with breakfast. For diabetes management Patient not taking: Reported on 03/30/2016 09/18/15   Encarnacion Slates, NP  oxyCODONE-acetaminophen (ROXICET) 5-325 MG tablet Take 1-2 tablets by mouth every 4 (four) hours as needed for severe pain. Patient not taking: Reported on 03/30/2016 12/26/15   Coralie Keens, MD  paliperidone (INVEGA SUSTENNA) 156 MG/ML SUSP injection Inject 1 mL (156 mg total) into the muscle every 28 (twenty-eight) days. (Last dose given on 09-10-15): For mood control Patient not taking: Reported on 03/30/2016 09/18/15   Encarnacion Slates, NP  Paliperidone Palmitate (INVEGA TRINZA IM) Inject 156 mg into the muscle every 3 (three) months.    Historical Provider, MD    Review of Systems:  Constitutional:  No weight loss, night sweats,  fatigue.  Head&Eyes: No headache.  No vision loss.  No eye pain or scotoma ENT:  No Difficulty swallowing,Tooth/dental problems,Sore throat,  No ear ache, post nasal drip,  Cardio-vascular:  No chest pain, Orthopnea, PND, swelling in lower extremities,  dizziness, palpitations  GI:  No  abdominal pain, diarrhea, loss of appetite, hematochezia, melena, heartburn, indigestion, Resp:  No shortness of breath with exertion or at rest. No cough. No coughing up of blood .No wheezing.No chest wall deformity  Skin:  no rash or lesions.  GU:  no dysuria, change in color of urine, no urgency or frequency. No flank pain.  Musculoskeletal:  No joint pain or swelling. No decreased range of motion. No back pain.  Psych:  No change in mood or affect Neurologic: No  no dysesthesia, no focal weakness, no vision loss. No syncope  Physical Exam: Filed Vitals:   03/30/16 1651 03/30/16 1732 03/30/16 1734 03/30/16 1752  BP: 189/108 111/86 129/87 143/73  Pulse: 97 90 95 118   Temp:    98.5 F (36.9 C)  TempSrc:      Resp: 21 17 15    Height:      Weight:      SpO2: 94% 98% 98% 100%   General:  A&O x 3, NAD, nontoxic, pleasant/cooperative Head/Eye: No conjunctival hemorrhage, no icterus, /AT, No nystagmus ENT:  No icterus,  No thrush, good dentition, no pharyngeal exudate Neck:  No masses, no lymphadenpathy, no bruits CV:  RRR, no rub, no gallop, no S3 Lung:  CTAB, good air movement, no wheeze, no rhonchi Abdomen: soft/NT, +BS, nondistended, no peritoneal signs;  No hepatosplenomegaly Ext: No cyanosis, No rashes, No petechiae, No lymphangitis, No  edema Neuro: CNII-XII intact, strength 4/5 in bilateral upper and lower extremities, no dysmetria  Labs on Admission:  Basic Metabolic Panel:  Recent Labs Lab 03/30/16 1554  NA 142  K 4.4  CL 109  CO2 23  GLUCOSE 117*  BUN 12  CREATININE 0.93  CALCIUM 9.5   Liver Function Tests:  Recent Labs Lab 03/30/16 1554  AST 28  ALT 19  ALKPHOS 101  BILITOT 0.5  PROT 7.6  ALBUMIN 4.3   No results for input(s): LIPASE, AMYLASE in the last 168 hours. No results for input(s): AMMONIA in the last 168 hours. CBC:  Recent Labs Lab 03/30/16 1554  WBC 11.5*  NEUTROABS 8.6*  HGB 14.0  HCT 42.4  MCV 86.0  PLT 404*   Cardiac Enzymes: No results for input(s): CKTOTAL, CKMB, CKMBINDEX, TROPONINI in the last 168 hours. BNP: Invalid input(s): POCBNP CBG: No results for input(s): GLUCAP in the last 168 hours.  Radiological Exams on Admission: Dg Chest 2 View  03/30/2016  CLINICAL DATA:  Tachycardia, hypertension, vomiting. EXAM: CHEST  2 VIEW COMPARISON:  March 23, 2016. FINDINGS: The heart size and mediastinal contours are within normal limits. Both lungs are clear. No pneumothorax or pleural effusion is noted. The visualized skeletal structures are unremarkable. IMPRESSION: No active cardiopulmonary disease. Electronically Signed   By: Marijo Conception, M.D.   On: 03/30/2016 15:30    EKG:  Independently reviewed. Sinus rhythm, no ST-T wave changes    Time spent:70 minutes Code Status:   FULL Family Communication:  No Family at bedside   Loreley Schwall, DO  [facility] Pager [phone]  If 7PM-7AM, please contact night-coverage [email or web address] Password [email or web address] 03/30/2016, 6:01 PM

## 2016-03-30 NOTE — Progress Notes (Signed)
PHARMACIST - PHYSICIAN ORDER COMMUNICATION  CONCERNING: P&T Medication Policy on Herbal Medications  DESCRIPTION:  This patient's order for:  melatonin  has been noted.  This product(s) is classified as an "herbal" or natural product. Due to a lack of definitive safety studies or FDA approval, nonstandard manufacturing practices, plus the potential risk of unknown drug-drug interactions while on inpatient medications, the Pharmacy and Therapeutics Committee does not permit the use of "herbal" or natural products of this type within [facility].   ACTION TAKEN: The pharmacy department is unable to verify this order at this time and your patient has been informed of this safety policy. Please reevaluate patient's clinical condition at discharge and address if the herbal or natural product(s) should be resumed at that time.  Thanks Dorrene German 03/30/2016 6:28 PM
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
## 2016-03-30 NOTE — ED Notes (Signed)
Unable to collect labs Patient transported to X-ray

## 2016-03-31 DIAGNOSIS — R232 Flushing: Secondary | ICD-10-CM

## 2016-03-31 DIAGNOSIS — R030 Elevated blood-pressure reading, without diagnosis of hypertension: Secondary | ICD-10-CM

## 2016-03-31 DIAGNOSIS — R072 Precordial pain: Secondary | ICD-10-CM | POA: Diagnosis not present

## 2016-03-31 DIAGNOSIS — D72829 Elevated white blood cell count, unspecified: Secondary | ICD-10-CM | POA: Diagnosis not present

## 2016-03-31 DIAGNOSIS — T43595A Adverse effect of other antipsychotics and neuroleptics, initial encounter: Secondary | ICD-10-CM | POA: Diagnosis not present

## 2016-03-31 DIAGNOSIS — G2579 Other drug induced movement disorders: Secondary | ICD-10-CM | POA: Diagnosis not present

## 2016-03-31 DIAGNOSIS — F250 Schizoaffective disorder, bipolar type: Secondary | ICD-10-CM | POA: Diagnosis not present

## 2016-03-31 LAB — BASIC METABOLIC PANEL
ANION GAP: 6 (ref 5–15)
BUN: 11 mg/dL (ref 6–20)
CALCIUM: 8.9 mg/dL (ref 8.9–10.3)
CO2: 25 mmol/L (ref 22–32)
CREATININE: 0.8 mg/dL (ref 0.44–1.00)
Chloride: 110 mmol/L (ref 101–111)
GFR calc Af Amer: >60 mL/min (ref >60)
GLUCOSE: 133 mg/dL — AB (ref 65–99)
Potassium: 4 mmol/L (ref 3.5–5.1)
Sodium: 141 mmol/L (ref 135–145)

## 2016-03-31 LAB — TROPONIN I

## 2016-03-31 LAB — CBC
HCT: 36.5 % (ref 36.0–46.0)
HEMOGLOBIN: 12 g/dL (ref 12.0–15.0)
MCH: 28.3 pg (ref 26.0–34.0)
MCHC: 32.9 g/dL (ref 30.0–36.0)
MCV: 86.1 fL (ref 78.0–100.0)
Platelets: 315 10*3/uL (ref 150–400)
RBC: 4.24 MIL/uL (ref 3.87–5.11)
RDW: 14.2 % (ref 11.5–15.5)
WBC: 8 10*3/uL (ref 4.0–10.5)

## 2016-03-31 MED ORDER — LORAZEPAM 2 MG/ML IJ SOLN
0.5000 mg | Freq: Once | INTRAMUSCULAR | Status: AC
  Administered 2016-03-31: 0.5 mg via INTRAVENOUS
  Filled 2016-03-31: qty 1

## 2016-03-31 MED ORDER — LORAZEPAM 1 MG PO TABS: 1.0000 mg | ORAL_TABLET | Freq: Two times a day (BID) | ORAL | Status: DC

## 2016-03-31 MED ORDER — LORAZEPAM 2 MG/ML IJ SOLN
INTRAMUSCULAR | Status: AC
Start: 2016-03-31 — End: 2016-03-31
  Filled 2016-03-31: qty 1

## 2016-03-31 MED ORDER — LORAZEPAM 2 MG/ML IJ SOLN
0.5000 mg | Freq: Once | INTRAMUSCULAR | Status: AC
  Administered 2016-03-31: 0.5 mg via INTRAVENOUS

## 2016-03-31 NOTE — Care Management Note (Signed)
Case Management Note  Patient Details  Name: [name] [name] MRN: [id number] Date of Birth: March 07, 1971  Subjective/Objective:  45 y/o f admitted w/htn. [id number] d/o.From home. Attempted to leave AMA. Psych cons placed.                 Action/Plan:d/c plan home.   Expected Discharge Date:   (unknown)               Expected Discharge Plan:  Home/Self Care  In-House Referral:     Discharge planning Services  CM Consult  Post Acute Care Choice:    Choice offered to:     DME Arranged:    DME Agency:     HH Arranged:    HH Agency:     Status of Service:  In process, will continue to follow  Medicare Important Message Given:    Date Medicare IM Given:    Medicare IM give by:    Date Additional Medicare IM Given:    Additional Medicare Important Message give by:     If discussed at [place] of Stay Meetings, dates discussed:    Additional Comments:  Dessa Phi, RN 03/31/2016, 1:07 PM

## 2016-03-31 NOTE — Discharge Summary (Signed)
Physician Discharge Summary  [name] [name] [id number] DOB: 29-Aug-1971 DOA: 03/30/2016  PCP: Jonathon Bellows, MD  Admit date: 03/30/2016 Discharge date: 03/31/2016  Time spent: 20 minutes  Recommendations for Outpatient Follow-up:  1. Close follow up pn BP and initiate therapy with antihypertensive regimen if needed  2. Repeat BMET to follow electrolytes and renal function 3. Patient needs close follow up with outpatient psychiatry service    Discharge Diagnoses:  Principal Problem:   Serotonin syndrome Active Problems:   Flushing   Schizoaffective disorder, bipolar type (HCC)   Hypertension   Elevated blood pressure   Leukocytosis   Precordial chest pain   Discharge Condition: left AMA; patient expressed she will follow up with PCP after discharge.  Filed Weights   03/30/16 1544 03/30/16 1807  Weight: 98.884 kg (218 lb) 98.612 kg (217 lb 6.4 oz)    History of present illness:  As per H&P written by Dr. Carles Collet on 03/30/16 45 year old female with a history of anxiety, depression, schizoaffective disorder presented with 1 day history of nausea, vomiting, tremors, and flushing. The patient was recently discharged from behavioral health Hospital in [place] on 03/28/2016. She was admitted at that time because of increasing anxiety, worsening depression, and paranoid thoughts, and passive suicidal ideation. During that behavioral health hospitalization, BuSpar was added to the patient's psychiatric regimen. She was continued on her fluoxetine, trazodone. Review of the medical record also shows that there was when necessary orders for Geodon and Haldol, but it is unclear whether the patient actually received any of these medications. The patient was discharged on 03/28/2016 after a three-day stay, and she was given a prescription for BuSpar. Her last dose of BuSpar was in the morning of 03/28/2016. She has not filled the prescription. Her last Invega dose was 02/22/16. On the  morning of 03/30/2016, the patient presented toher primary care doctor with the above symptoms. There was concern that she may have been developing neuroleptic malignant syndrome. As result, she was sent to the emergency department for further evaluation. At the time of my evaluation, pt denies fevers, chills, headache, chest pain, dyspnea, nausea, vomiting, diarrhea, abdominal pain, dysuria, hematuria  In the emergency department, the patient was noted to be afebrile. She was tachycardic initially with heart rate in the 130s and hypertensive with blood pressure 189/108. BMP and CBC were essentially unremarkable except for WBC 11.5. The patient was given Ativan 1 mg IV, GI cocktail, and 1 L fluid bolus.  Hospital Course:  1-atypical CP: -with neg troponin -most likely secondary to GERD -patient left AMA and no further evaluation or medication adjustment provided  2-flushing/tremors: -unclear etiology -no serotonin or neuroleptic syndrome according to psych  3-leukocytosis -no signs of infection -after IVF's given WBC's WNL  4-elevated BP: no hx of HTN -BP fluctuating along with episodes of anxiety -was on low sodium diet -when ativan given for anxiety, BP improved -will need close outpatient follow up  5-diabetes: -was on metformin -A1C pending at discharge    Procedures:  None   Consultations:  Psychiatry service   Discharge Exam: Filed Vitals:   03/31/16 0658 03/31/16 1242  BP: 143/98 160/83  Pulse:    Temp:    Resp:      General: patient found by staff not to have active SI or hallucinations. Despite asking patient to stay for further management and adjustment on her medication therapy. She decide to leave against medical advise. Hemodynamically stable, but with mild elevation in BP.  Discharge Instructions    Discharge Medication List as of 03/31/2016  3:59 PM    CONTINUE these medications which have NOT CHANGED   Details  busPIRone (BUSPAR) 10 MG tablet  Take 10 mg by mouth 3 (three) times daily. , Starting 03/28/2016, Until Tue 04/27/16, Historical Med    FLUoxetine (PROZAC) 40 MG capsule Take 1 capsule (40 mg total) by mouth daily. For depression, Starting 09/18/2015, Until Discontinued, Normal    hydrOXYzine (ATARAX/VISTARIL) 25 MG tablet Take 1 tablet (25 mg total) by mouth every 6 (six) hours as needed for anxiety., Starting 09/18/2015, Until Discontinued, Normal    Melatonin 10 MG TABS Take 1 tablet by mouth at bedtime., Until Discontinued, Historical Med    meloxicam (MOBIC) 15 MG tablet Take 15 mg by mouth daily as needed for pain. , Starting 01/27/2016, Until Discontinued, Historical Med    !! traZODone (DESYREL) 100 MG tablet Take 100-200 mg by mouth at bedtime as needed for sleep. , Until Discontinued, Historical Med    !! traZODone (DESYREL) 50 MG tablet Take 1 tablet (50 mg total) by mouth at bedtime as needed for sleep., Starting 09/18/2015, Until Discontinued, Normal    cloZAPine (CLOZARIL) 50 MG tablet Take 1 tablet (50 mg total) by mouth at bedtime. For mood control, Starting 09/18/2015, Until Discontinued, Normal    metFORMIN (GLUCOPHAGE) 500 MG tablet Take 1 tablet (500 mg total) by mouth daily with breakfast. For diabetes management, Starting 09/18/2015, Until Discontinued, Normal    oxyCODONE-acetaminophen (ROXICET) 5-325 MG tablet Take 1-2 tablets by mouth every 4 (four) hours as needed for severe pain., Starting 12/26/2015, Until Discontinued, Print    paliperidone (INVEGA SUSTENNA) 156 MG/ML SUSP injection Inject 1 mL (156 mg total) into the muscle every 28 (twenty-eight) days. (Last dose given on 09-10-15): For mood control, Starting 09/18/2015, Until Discontinued, Normal    Paliperidone Palmitate (INVEGA TRINZA IM) Inject 156 mg into the muscle every 3 (three) months., Until Discontinued, Historical Med     !! - Potential duplicate medications found. Please discuss with provider.     Allergies  Allergen Reactions  .  Augmentin [Amoxicillin-Pot Clavulanate] Itching, Swelling and Rash    Has patient had a PCN reaction causing immediate rash, facial/tongue/throat swelling, SOB or lightheadedness with hypotension: Yes Has patient had a PCN reaction causing severe rash involving mucus membranes or skin necrosis: No Has patient had a PCN reaction that required hospitalization No Has patient had a PCN reaction occurring within the last 10 years: Yes If all of the above answers are "NO", then may proceed with Cephalosporin use.  . Enoxaparin Hives  . Avelox [Moxifloxacin Hcl In Nacl] Itching, Swelling and Rash  . Sodium Hydroxide Rash    The results of significant diagnostics from this hospitalization (including imaging, microbiology, ancillary and laboratory) are listed below for reference.    Significant Diagnostic Studies: Dg Chest 2 View  03/30/2016  CLINICAL DATA:  Tachycardia, hypertension, vomiting. EXAM: CHEST  2 VIEW COMPARISON:  March 23, 2016. FINDINGS: The heart size and mediastinal contours are within normal limits. Both lungs are clear. No pneumothorax or pleural effusion is noted. The visualized skeletal structures are unremarkable. IMPRESSION: No active cardiopulmonary disease. Electronically Signed   By: Marijo Conception, M.D.   On: 03/30/2016 15:30   Ct Head Wo Contrast  03/30/2016  CLINICAL DATA:  45 year old presenting with 3 day history of occipital headaches associated with nausea and vomiting. Current hypertension. EXAM: CT HEAD WITHOUT CONTRAST TECHNIQUE: Contiguous axial images were  obtained from the base of the skull through the vertex without intravenous contrast. COMPARISON:  07/17/2012, 08/25/2009. FINDINGS: Ventricular system normal in size and appearance for age. Asymmetry in the lateral ventricles, left greater than right is unchanged and is felt to be developmental. No mass lesion. No midline shift. No acute hemorrhage or hematoma. No extra-axial fluid collections. No evidence of acute  infarction. No focal brain parenchymal abnormality. No significant interval change. No skull fracture or other focal osseous abnormality involving the skull. Visualized paranasal sinuses, bilateral mastoid air cells and bilateral middle ear cavities well-aerated. IMPRESSION: Normal and stable examination. Electronically Signed   By: Evangeline Dakin M.D.   On: 03/30/2016 18:55    Microbiology: Recent Results (from the past 240 hour(s))  Urine culture     Status: None (Preliminary result)   Collection Time: 03/30/16  8:00 PM  Result Value Ref Range Status   Specimen Description URINE, RANDOM  Final   Special Requests NONE  Final   Culture   Final    NO GROWTH < 24 HOURS Performed at [facility]    Report Status PENDING  Incomplete     Labs: Basic Metabolic Panel:  Recent Labs Lab 03/30/16 1554 03/31/16 0647  NA 142 141  K 4.4 4.0  CL 109 110  CO2 23 25  GLUCOSE 117* 133*  BUN 12 11  CREATININE 0.93 0.80  CALCIUM 9.5 8.9   Liver Function Tests:  Recent Labs Lab 03/30/16 1554  AST 28  ALT 19  ALKPHOS 101  BILITOT 0.5  PROT 7.6  ALBUMIN 4.3    Recent Labs Lab 03/30/16 1830  LIPASE 31   CBC:  Recent Labs Lab 03/30/16 1554 03/31/16 0647  WBC 11.5* 8.0  NEUTROABS 8.6*  --   HGB 14.0 12.0  HCT 42.4 36.5  MCV 86.0 86.1  PLT 404* 315   Cardiac Enzymes:  Recent Labs Lab 03/30/16 1830 03/30/16 2359 03/31/16 0647  CKTOTAL 39  --   --   TROPONINI <0.03 <0.03 <0.03    Signed:  Barton Dubois MD.  [facility] 03/31/2016, 4:47 PM

## 2016-03-31 NOTE — Care Management Obs Status (Signed)
[place] NOTIFICATION   Patient Details  Name: [name] [name] MRN: [id number] Date of Birth: Apr 09, 1971   Medicare Observation Status Notification Given:  Yes    MahabirJuliann Pulse, RN 03/31/2016, 1:05 PM

## 2016-03-31 NOTE — Consult Note (Signed)
[facility] Face-to-Face Psychiatry Consult   Reason for Consult:  Serotonin syndrome Referring Physician:  Dr. Carles Collet Patient Identification: [name] [name] MRN:  [id number] Principal Diagnosis: Serotonin syndrome Diagnosis:   Patient Active Problem List   Diagnosis Date Noted  . Hypertension [I10] 03/30/2016  . Serotonin syndrome [G25.79] 03/30/2016  . Elevated blood pressure [R03.0] 03/30/2016  . Leukocytosis [D72.829] 03/30/2016  . Precordial chest pain [R07.2] 03/30/2016  . Incisional hernia [K43.2] 12/24/2015  . Drug overdose, intentional ([place]) [T50.902A]   . Hyperprolactinemia ([place]) [E22.1] 09/11/2015  . Schizoaffective disorder, bipolar type ([place]) [F25.0] 09/10/2015  . Hx of borderline personality disorder [Z86.59] 09/10/2015  . PTSD (post-traumatic stress disorder) [F43.10] 09/10/2015  . Suicide attempt ([place]) [T14.91] 09/05/2015  . Overdose, drug [T50.901A] 09/05/2015  . Overdose [T50.901A] 09/05/2015  . Drug overdose [T50.901A]   . QT prolongation [I45.81]   . Weight gain [R63.5] 03/06/2015  . Flushing [R23.2] 03/06/2015  . Iron deficiency anemia, unspecified [D50.9] 11/15/2013  . Liver mass, left lobe [R16.0] 05/28/2013  . Postop check [Z09] 03/13/2013  . Postoperative wound infection [T81.4XXA] 02/23/2013  . History of colostomy [Z98.890] 02/16/2013  . Borderline personality disorder [F60.3] 08/16/2012    Class: Chronic  . Acute blood loss anemia [D62] 08/01/2012  . Depression, major ([place]) [F32.9] 08/01/2012  . Renal calculus, left [N20.0] 03/31/2012  . Hydronephrosis, left [N13.30] 03/31/2012    Total Time spent with patient: 1 hour  Subjective:   [name] [name] is a 45 y.o. female patient admitted with nausea, vomiting, tremors, and flushing.  HPI:  [name] [name] is a 45 years old female admitted to [facility] with the increased symptoms of serotonin syndrome, palpitations, generalized weakness, hand shaking, nausea, vomiting. Patient is also  reportedly used to live in [place] but currently staying in [place] for the last one year and her daughter who is 45 years old mood and with her about 4 months ago. Patient reportedly went to see her primary care physician who found her with the acute physical sickness and probably secondary to her current medication management and recommended inpatient hospitalization. Patient has a disturbed sleep which helped with the new medication given to her which is Restoril last night. Patient has been separated and divorced from her husband who works for the [employer]. Patient has been receiving outpatient psychiatric medication from the Dr. Drue Second at [facility] recovery services. Patient is also under the care of ACT team. Patient stated she is feeling anxious and she wanted to go home and denies active suicidal/homicidal ideation, intention or plans. Patient has depression anxiety related as 3 out of 10. Patient is willing to stay in hospital for more for medical clearance.  Past Psychiatric History: Has been diagnosed with schizoaffective disorder, depression, and anxiety and has a decent hospitalization at old [facility] and also has a history of acute psychiatric hospitalization at [facility] September 2016 for psychosis.  Risk to Self: Is patient at risk for suicide?: No Risk to Others:   Prior Inpatient Therapy:   Prior Outpatient Therapy:    Past Medical History:  Past Medical History  Diagnosis Date  . Heart murmur     asa child   . Anemia     hx of   . Depression   . Anxiety   . Anemia   . Iron deficiency anemia, unspecified 11/15/2013  . Iron deficiency anemia, unspecified 11/15/2013  . Hyperlipidemia   . Paranoid schizophrenia ([place])   . Hypertension  not on medication  . Hx pulmonary embolism 2013    after surgery from [place]  . Headache     migraines  . Blood dyscrasia     pt not aware  . Diarrhea, functional   . Diverticulitis   . Chronic  kidney disease     kidney stone   . Chronic kidney disease   . Renal insufficiency   . Personality disorder     Past Surgical History  Procedure Laterality Date  . Other surgical history      cyst removed from ovary ? side   . Nephrolithotomy  03/31/2012    Procedure: NEPHROLITHOTOMY PERCUTANEOUS;  Surgeon: Claybon Jabs, MD;  Location: [facility];  Service: Urology;  Laterality: Left;  . Tubal ligation    . Laparotomy  07/31/2012    Procedure: EXPLORATORY LAPAROTOMY;  Surgeon: Harl Bowie, MD;  Location: [place];  Service: General;  Laterality: N/A;  REPAIR OF PANCREATIC INJURY, EXPLORATION OF RETROPERITONEUM.  [name] Colostomy  07/31/2012    Procedure: COLOSTOMY;  Surgeon: Harl Bowie, MD;  Location: [place];  Service: General;  Laterality: Right;  . Colostomy reversal    . [facility]  2013    ABDOMINAL SURGERY  . Colostomy closure N/A 02/15/2013    Procedure: COLOSTOMY CLOSURE;  Surgeon: Gwenyth Ober, MD;  Location: [place];  Service: General;  Laterality: N/A;  Reversal of colostomy  . Dilatation & curettage/hysteroscopy with trueclear N/A 10/24/2013    Procedure: DILATATION & CURETTAGE/HYSTEROSCOPY WITH TRUECLEAR, CERVICAL BLOCK;  Surgeon: Marylynn Pearson, MD;  Location: [facility];  Service: Gynecology;  Laterality: N/A;  . Breast reduction Left 06/2014  . Incisional hernia repair N/A 12/24/2015    Procedure:  INCISIONAL HERNIA REPAIR WITH MESH;  Surgeon: Coralie Keens, MD;  Location: [place];  Service: General;  Laterality: N/A;  . Insertion of mesh N/A 12/24/2015    Procedure: INSERTION OF MESH;  Surgeon: Coralie Keens, MD;  Location: [place];  Service: General;  Laterality: N/A;   Family History:  Family History  Problem Relation Age of Onset  . Cancer Mother     kidney  . Depression Mother   . Other Neg Hx     adrenal problem   Family Psychiatric  History: unknown Social History:  History  Alcohol Use No     History  Drug Use No    Social History   Social History  .  Marital Status: Single    Spouse Name: N/A  . Number of Children: 2  . Years of Education: N/A   Occupational History  . Disabled    Social History Main Topics  . Smoking status: Never Smoker   . Smokeless tobacco: Never Used  . Alcohol Use: No  . Drug Use: No  . Sexual Activity: Yes    Birth Control/ Protection: Surgical   Other Topics Concern  . None   Social History Narrative   ** Merged History Encounter **       Additional Social History:    Allergies:   Allergies  Allergen Reactions  . Augmentin [Amoxicillin-Pot Clavulanate] Itching, Swelling and Rash    Has patient had a PCN reaction causing immediate rash, facial/tongue/throat swelling, SOB or lightheadedness with hypotension: Yes Has patient had a PCN reaction causing severe rash involving mucus membranes or skin necrosis: No Has patient had a PCN reaction that required hospitalization No Has patient had a PCN reaction occurring within the last 10 years: Yes If all of the above  answers are "NO", then may proceed with Cephalosporin use.  . Enoxaparin Hives  . Avelox [Moxifloxacin Hcl In Nacl] Itching, Swelling and Rash  . Sodium Hydroxide Rash    Labs:  Results for orders placed or performed during the hospital encounter of 03/30/16 (from the past 48 hour(s))  CBC with Differential     Status: Abnormal   Collection Time: 03/30/16  3:54 PM  Result Value Ref Range   WBC 11.5 (H) 4.0 - 10.5 K/uL   RBC 4.93 3.87 - 5.11 MIL/uL   Hemoglobin 14.0 12.0 - 15.0 g/dL   HCT 32.5 49.8 - 26.4 %   MCV 86.0 78.0 - 100.0 fL   MCH 28.4 26.0 - 34.0 pg   MCHC 33.0 30.0 - 36.0 g/dL   RDW 15.8 30.9 - 40.7 %   Platelets 404 (H) 150 - 400 K/uL   Neutrophils Relative % 75 %   Neutro Abs 8.6 (H) 1.7 - 7.7 K/uL   Lymphocytes Relative 16 %   Lymphs Abs 1.9 0.7 - 4.0 K/uL   Monocytes Relative 8 %   Monocytes Absolute 0.9 0.1 - 1.0 K/uL   Eosinophils Relative 1 %   Eosinophils Absolute 0.1 0.0 - 0.7 K/uL   Basophils Relative 0  %   Basophils Absolute 0.1 0.0 - 0.1 K/uL  Comprehensive metabolic panel     Status: Abnormal   Collection Time: 03/30/16  3:54 PM  Result Value Ref Range   Sodium 142 135 - 145 mmol/L   Potassium 4.4 3.5 - 5.1 mmol/L   Chloride 109 101 - 111 mmol/L   CO2 23 22 - 32 mmol/L   Glucose, Bld 117 (H) 65 - 99 mg/dL   BUN 12 6 - 20 mg/dL   Creatinine, Ser 6.80 0.44 - 1.00 mg/dL   Calcium 9.5 8.9 - 88.1 mg/dL   Total Protein 7.6 6.5 - 8.1 g/dL   Albumin 4.3 3.5 - 5.0 g/dL   AST 28 15 - 41 U/L   ALT 19 14 - 54 U/L   Alkaline Phosphatase 101 38 - 126 U/L   Total Bilirubin 0.5 0.3 - 1.2 mg/dL   GFR calc non Af Amer >60 >60 mL/min   GFR calc Af Amer >60 >60 mL/min    Comment: (NOTE) The eGFR has been calculated using the CKD EPI equation. This calculation has not been validated in all clinical situations. eGFR's persistently <60 mL/min signify possible Chronic Kidney Disease.    Anion gap 10 5 - 15  I-Stat Beta hCG blood, ED ([facility], [facility], [facility] only)     Status: None   Collection Time: 03/30/16  4:02 PM  Result Value Ref Range   I-stat hCG, quantitative <5.0 <5 mIU/mL   Comment 3            Comment:   GEST. AGE      CONC.  (mIU/mL)   <=1 WEEK        5 - 50     2 WEEKS       50 - 500     3 WEEKS       100 - 10,000     4 WEEKS     1,000 - 30,000        FEMALE AND NON-PREGNANT FEMALE:     LESS THAN 5 mIU/mL   I-Stat Troponin, ED - 0, 3, 6 hours (not at [facility])     Status: None   Collection Time: 03/30/16  4:03 PM  Result Value  Ref Range   Troponin i, poc 0.00 0.00 - 0.08 ng/mL   Comment 3            Comment: Due to the release kinetics of cTnI, a negative result within the first hours of the onset of symptoms does not rule out myocardial infarction with certainty. If myocardial infarction is still suspected, repeat the test at appropriate intervals.   CK     Status: None   Collection Time: 03/30/16  6:30 PM  Result Value Ref Range   Total CK 39 38 - 234 U/L  Troponin I (q 6hr x 3)      Status: None   Collection Time: 03/30/16  6:30 PM  Result Value Ref Range   Troponin I <0.03 <0.031 ng/mL    Comment:        NO INDICATION OF MYOCARDIAL INJURY.   TSH     Status: None   Collection Time: 03/30/16  6:30 PM  Result Value Ref Range   TSH 0.907 0.350 - 4.500 uIU/mL  Lactate dehydrogenase     Status: Abnormal   Collection Time: 03/30/16  6:30 PM  Result Value Ref Range   LDH 94 (L) 98 - 192 U/L  Lipase, blood     Status: None   Collection Time: 03/30/16  6:30 PM  Result Value Ref Range   Lipase 31 11 - 51 U/L  Lactic acid, plasma     Status: None   Collection Time: 03/30/16  6:30 PM  Result Value Ref Range   Lactic Acid, Venous 1.4 0.5 - 2.0 mmol/L  Urine rapid drug screen (hosp performed)     Status: Abnormal   Collection Time: 03/30/16  8:00 PM  Result Value Ref Range   Opiates NONE DETECTED NONE DETECTED   Cocaine NONE DETECTED NONE DETECTED   Benzodiazepines POSITIVE (A) NONE DETECTED   Amphetamines NONE DETECTED NONE DETECTED   Tetrahydrocannabinol NONE DETECTED NONE DETECTED   Barbiturates NONE DETECTED NONE DETECTED    Comment:        DRUG SCREEN FOR MEDICAL PURPOSES ONLY.  IF CONFIRMATION IS NEEDED FOR ANY PURPOSE, NOTIFY LAB WITHIN 5 DAYS.        LOWEST DETECTABLE LIMITS FOR URINE DRUG SCREEN Drug Class       Cutoff (ng/mL) Amphetamine      1000 Barbiturate      200 Benzodiazepine   097 Tricyclics       353 Opiates          300 Cocaine          300 THC              50   Urinalysis, Routine w reflex microscopic (not at [facility])     Status: Abnormal   Collection Time: 03/30/16  8:00 PM  Result Value Ref Range   Color, Urine YELLOW YELLOW   APPearance CLOUDY (A) CLEAR   Specific Gravity, Urine 1.020 1.005 - 1.030   pH 6.5 5.0 - 8.0   Glucose, UA NEGATIVE NEGATIVE mg/dL   Hgb urine dipstick NEGATIVE NEGATIVE   Bilirubin Urine NEGATIVE NEGATIVE   Ketones, ur NEGATIVE NEGATIVE mg/dL   Protein, ur NEGATIVE NEGATIVE mg/dL   Nitrite NEGATIVE  NEGATIVE   Leukocytes, UA NEGATIVE NEGATIVE    Comment: MICROSCOPIC NOT DONE ON URINES WITH NEGATIVE PROTEIN, BLOOD, LEUKOCYTES, NITRITE, OR GLUCOSE <1000 mg/dL.  Troponin I (q 6hr x 3)     Status: None   Collection Time: 03/30/16 11:59 PM  Result Value Ref Range   Troponin I <0.03 <0.031 ng/mL    Comment:        NO INDICATION OF MYOCARDIAL INJURY.   Basic metabolic panel     Status: Abnormal   Collection Time: 03/31/16  6:47 AM  Result Value Ref Range   Sodium 141 135 - 145 mmol/L   Potassium 4.0 3.5 - 5.1 mmol/L   Chloride 110 101 - 111 mmol/L   CO2 25 22 - 32 mmol/L   Glucose, Bld 133 (H) 65 - 99 mg/dL   BUN 11 6 - 20 mg/dL   Creatinine, Ser 0.80 0.44 - 1.00 mg/dL   Calcium 8.9 8.9 - 10.3 mg/dL   GFR calc non Af Amer >60 >60 mL/min   GFR calc Af Amer >60 >60 mL/min    Comment: (NOTE) The eGFR has been calculated using the CKD EPI equation. This calculation has not been validated in all clinical situations. eGFR's persistently <60 mL/min signify possible Chronic Kidney Disease.    Anion gap 6 5 - 15  CBC     Status: None   Collection Time: 03/31/16  6:47 AM  Result Value Ref Range   WBC 8.0 4.0 - 10.5 K/uL   RBC 4.24 3.87 - 5.11 MIL/uL   Hemoglobin 12.0 12.0 - 15.0 g/dL   HCT 36.5 36.0 - 46.0 %   MCV 86.1 78.0 - 100.0 fL   MCH 28.3 26.0 - 34.0 pg   MCHC 32.9 30.0 - 36.0 g/dL   RDW 14.2 11.5 - 15.5 %   Platelets 315 150 - 400 K/uL  Troponin I (q 6hr x 3)     Status: None   Collection Time: 03/31/16  6:47 AM  Result Value Ref Range   Troponin I <0.03 <0.031 ng/mL    Comment:        NO INDICATION OF MYOCARDIAL INJURY.     Current Facility-Administered Medications  Medication Dose Route Frequency Provider Last Rate Last Dose  . acetaminophen (TYLENOL) tablet 650 mg  650 mg Oral Q6H PRN Orson Eva, MD   650 mg at 03/30/16 2011   Or  . acetaminophen (TYLENOL) suppository 650 mg  650 mg Rectal Q6H PRN Orson Eva, MD      . hydrALAZINE (APRESOLINE) injection 5 mg  5  mg Intravenous Q6H PRN Orson Eva, MD      . hydrOXYzine (ATARAX/VISTARIL) tablet 25 mg  25 mg Oral QID Orson Eva, MD   25 mg at 03/30/16 2122  . meloxicam (MOBIC) tablet 15 mg  15 mg Oral Daily PRN Orson Eva, MD      . ondansetron [facility]) injection 4 mg  4 mg Intravenous Q6H PRN [name] Brow Tat, MD      . temazepam (RESTORIL) capsule 7.5 mg  7.5 mg Oral QHS PRN Jeryl Columbia, NP        Musculoskeletal: Strength & Muscle Tone: decreased Gait & Station: normal Patient leans: N/A  Psychiatric Specialty Exam: ROS palpitations, increasing blood pressure, sweating and shaking. She also has a disturbance of sleep and appetite  No Fever-chills, No Headache, No changes with Vision or hearing, reports vertigo No problems swallowing food or Liquids, No Chest pain, Cough or Shortness of Breath, No Abdominal pain, No Nausea or Vommitting, Bowel movements are regular, No Blood in stool or Urine, No dysuria, No new skin rashes or bruises, No new joints pains-aches,  No new weakness, tingling, numbness in any extremity, No recent weight gain or loss, No polyuria, polydypsia  or polyphagia,   A full 10 point Review of Systems was done, except as stated above, all other Review of Systems were negative.  Blood pressure 143/98, pulse 72, temperature 98.1 F (36.7 C), temperature source Oral, resp. rate 20, height _0  (1.626 m), weight 98.612 kg (217 lb 6.4 oz), last menstrual period 03/16/2016, SpO2 99 %.Body mass index is 37.3 kg/(m^2).  General Appearance: Guarded  Eye Contact::  Good  Speech:  Clear and Coherent and Slow  Volume:  Decreased  Mood:  Anxious and Depressed  Affect:  Appropriate and Congruent  Thought Process:  Coherent and Goal Directed  Orientation:  Full (Time, Place, and Person)  Thought Content:  WDL  Suicidal Thoughts:  No  Homicidal Thoughts:  No  Memory:  Immediate;   Good Recent;   Fair Remote;   Fair  Judgement:  Intact  Insight:  Good  Psychomotor Activity:   Restlessness  Concentration:  Fair  Recall:  [employer] of Knowledge:Good  Language: Good  Akathisia:  Negative  Handed:  Right  AIMS (if indicated):     Assets:  Communication Skills Desire for Improvement Financial Resources/Insurance Housing Leisure Time Resilience Social Support Transportation  ADL's:  Intact  Cognition: WNL  Sleep:      Treatment Plan Summary: Daily contact with patient to assess and evaluate symptoms and progress in treatment and Medication management  Patient has no safety concerns and contract for safety Provided supportive therapy Monitor for serotonin toxicity Reviewed recent medication changes reportedly patient has added BuSpar and stop Zyprexa recently Refer to the unit social worker to contact patient ACT team for collateral information Appreciate psychiatric consultation and follow up as clinically required Please contact [phone] or [phone] if needs further assistance  Disposition: Patient does not meet criteria for psychiatric inpatient admission. Supportive therapy provided about ongoing stressors.  Durward Parcel., MD 03/31/2016 9:00 AM

## 2016-03-31 NOTE — Progress Notes (Signed)
Pt very confused throughout the night, continuously pulling at gown, eventually took her leads and cardiac monitor off and changed into her clothes from home. Will not put telemetry back on at this time. Will continue to monitor pt very closely and attempt to place telemetry leads frequently.

## 2016-04-01 LAB — URINE CULTURE

## 2016-04-01 LAB — HEMOGLOBIN A1C
Hgb A1c MFr Bld: 6.2 % — ABNORMAL HIGH (ref 4.8–5.6)
Mean Plasma Glucose: 131 mg/dL

## 2016-04-07 DIAGNOSIS — R030 Elevated blood-pressure reading, without diagnosis of hypertension: Secondary | ICD-10-CM | POA: Diagnosis not present

## 2016-04-07 DIAGNOSIS — R000 Tachycardia, unspecified: Secondary | ICD-10-CM | POA: Diagnosis not present

## 2016-04-07 DIAGNOSIS — Z09 Encounter for follow-up examination after completed treatment for conditions other than malignant neoplasm: Secondary | ICD-10-CM | POA: Diagnosis not present

## 2016-04-08 DIAGNOSIS — T148 Other injury of unspecified body region: Secondary | ICD-10-CM | POA: Diagnosis not present

## 2016-04-15 DIAGNOSIS — D485 Neoplasm of uncertain behavior of skin: Secondary | ICD-10-CM | POA: Diagnosis not present

## 2016-04-22 DIAGNOSIS — Z79899 Other long term (current) drug therapy: Secondary | ICD-10-CM | POA: Diagnosis not present

## 2016-04-22 DIAGNOSIS — K5730 Diverticulosis of large intestine without perforation or abscess without bleeding: Secondary | ICD-10-CM | POA: Diagnosis not present

## 2016-04-22 DIAGNOSIS — R45851 Suicidal ideations: Secondary | ICD-10-CM | POA: Diagnosis not present

## 2016-04-22 DIAGNOSIS — F29 Unspecified psychosis not due to a substance or known physiological condition: Secondary | ICD-10-CM | POA: Diagnosis not present

## 2016-04-22 DIAGNOSIS — G255 Other chorea: Secondary | ICD-10-CM | POA: Diagnosis not present

## 2016-04-22 DIAGNOSIS — I10 Essential (primary) hypertension: Secondary | ICD-10-CM | POA: Diagnosis not present

## 2016-04-22 DIAGNOSIS — N1330 Unspecified hydronephrosis: Secondary | ICD-10-CM | POA: Diagnosis not present

## 2016-04-22 DIAGNOSIS — E119 Type 2 diabetes mellitus without complications: Secondary | ICD-10-CM | POA: Diagnosis not present

## 2016-04-22 DIAGNOSIS — F251 Schizoaffective disorder, depressive type: Secondary | ICD-10-CM | POA: Diagnosis not present

## 2016-04-22 DIAGNOSIS — Z888 Allergy status to other drugs, medicaments and biological substances status: Secondary | ICD-10-CM | POA: Diagnosis not present

## 2016-04-22 DIAGNOSIS — R110 Nausea: Secondary | ICD-10-CM | POA: Diagnosis not present

## 2016-04-22 DIAGNOSIS — F333 Major depressive disorder, recurrent, severe with psychotic symptoms: Secondary | ICD-10-CM | POA: Diagnosis not present

## 2016-04-22 DIAGNOSIS — A088 Other specified intestinal infections: Secondary | ICD-10-CM | POA: Diagnosis not present

## 2016-04-23 DIAGNOSIS — I10 Essential (primary) hypertension: Secondary | ICD-10-CM | POA: Diagnosis present

## 2016-04-23 DIAGNOSIS — F29 Unspecified psychosis not due to a substance or known physiological condition: Secondary | ICD-10-CM | POA: Diagnosis not present

## 2016-04-23 DIAGNOSIS — F339 Major depressive disorder, recurrent, unspecified: Secondary | ICD-10-CM | POA: Diagnosis not present

## 2016-04-23 DIAGNOSIS — E119 Type 2 diabetes mellitus without complications: Secondary | ICD-10-CM | POA: Diagnosis present

## 2016-04-23 DIAGNOSIS — F333 Major depressive disorder, recurrent, severe with psychotic symptoms: Secondary | ICD-10-CM | POA: Diagnosis not present

## 2016-04-23 DIAGNOSIS — G255 Other chorea: Secondary | ICD-10-CM | POA: Diagnosis present

## 2016-04-23 DIAGNOSIS — K219 Gastro-esophageal reflux disease without esophagitis: Secondary | ICD-10-CM | POA: Diagnosis present

## 2016-04-23 DIAGNOSIS — E7800 Pure hypercholesterolemia, unspecified: Secondary | ICD-10-CM | POA: Diagnosis present

## 2016-04-23 DIAGNOSIS — Z79899 Other long term (current) drug therapy: Secondary | ICD-10-CM | POA: Diagnosis not present

## 2016-04-23 DIAGNOSIS — R45851 Suicidal ideations: Secondary | ICD-10-CM | POA: Diagnosis not present

## 2016-04-23 DIAGNOSIS — Z888 Allergy status to other drugs, medicaments and biological substances status: Secondary | ICD-10-CM | POA: Diagnosis not present

## 2016-05-25 DIAGNOSIS — Z79899 Other long term (current) drug therapy: Secondary | ICD-10-CM | POA: Diagnosis not present

## 2016-05-25 DIAGNOSIS — Z86010 Personal history of colonic polyps: Secondary | ICD-10-CM | POA: Diagnosis not present

## 2016-05-25 DIAGNOSIS — Z87442 Personal history of urinary calculi: Secondary | ICD-10-CM | POA: Diagnosis not present

## 2016-05-25 DIAGNOSIS — F333 Major depressive disorder, recurrent, severe with psychotic symptoms: Secondary | ICD-10-CM | POA: Diagnosis not present

## 2016-05-25 DIAGNOSIS — Z791 Long term (current) use of non-steroidal anti-inflammatories (NSAID): Secondary | ICD-10-CM | POA: Diagnosis not present

## 2016-05-25 DIAGNOSIS — D509 Iron deficiency anemia, unspecified: Secondary | ICD-10-CM | POA: Diagnosis not present

## 2016-05-25 DIAGNOSIS — Z818 Family history of other mental and behavioral disorders: Secondary | ICD-10-CM | POA: Diagnosis not present

## 2016-05-25 DIAGNOSIS — G4700 Insomnia, unspecified: Secondary | ICD-10-CM | POA: Diagnosis not present

## 2016-05-25 DIAGNOSIS — Z7984 Long term (current) use of oral hypoglycemic drugs: Secondary | ICD-10-CM | POA: Diagnosis not present

## 2016-05-25 DIAGNOSIS — E559 Vitamin D deficiency, unspecified: Secondary | ICD-10-CM | POA: Diagnosis not present

## 2016-05-25 DIAGNOSIS — Z881 Allergy status to other antibiotic agents status: Secondary | ICD-10-CM | POA: Diagnosis not present

## 2016-05-25 DIAGNOSIS — Z9851 Tubal ligation status: Secondary | ICD-10-CM | POA: Diagnosis not present

## 2016-05-25 DIAGNOSIS — Z888 Allergy status to other drugs, medicaments and biological substances status: Secondary | ICD-10-CM | POA: Diagnosis not present

## 2016-05-25 DIAGNOSIS — F419 Anxiety disorder, unspecified: Secondary | ICD-10-CM | POA: Diagnosis not present

## 2016-05-25 DIAGNOSIS — Z880 Allergy status to penicillin: Secondary | ICD-10-CM | POA: Diagnosis not present

## 2016-06-01 DIAGNOSIS — Z881 Allergy status to other antibiotic agents status: Secondary | ICD-10-CM | POA: Diagnosis not present

## 2016-06-01 DIAGNOSIS — Z79899 Other long term (current) drug therapy: Secondary | ICD-10-CM | POA: Diagnosis not present

## 2016-06-01 DIAGNOSIS — F419 Anxiety disorder, unspecified: Secondary | ICD-10-CM | POA: Diagnosis not present

## 2016-06-01 DIAGNOSIS — Z7984 Long term (current) use of oral hypoglycemic drugs: Secondary | ICD-10-CM | POA: Diagnosis not present

## 2016-06-01 DIAGNOSIS — Z880 Allergy status to penicillin: Secondary | ICD-10-CM | POA: Diagnosis not present

## 2016-06-01 DIAGNOSIS — G4730 Sleep apnea, unspecified: Secondary | ICD-10-CM | POA: Diagnosis not present

## 2016-06-01 DIAGNOSIS — Z888 Allergy status to other drugs, medicaments and biological substances status: Secondary | ICD-10-CM | POA: Diagnosis not present

## 2016-06-01 DIAGNOSIS — D509 Iron deficiency anemia, unspecified: Secondary | ICD-10-CM | POA: Diagnosis not present

## 2016-06-01 DIAGNOSIS — Z791 Long term (current) use of non-steroidal anti-inflammatories (NSAID): Secondary | ICD-10-CM | POA: Diagnosis not present

## 2016-06-01 DIAGNOSIS — E559 Vitamin D deficiency, unspecified: Secondary | ICD-10-CM | POA: Diagnosis not present

## 2016-06-01 DIAGNOSIS — Z86010 Personal history of colonic polyps: Secondary | ICD-10-CM | POA: Diagnosis not present

## 2016-06-01 DIAGNOSIS — F333 Major depressive disorder, recurrent, severe with psychotic symptoms: Secondary | ICD-10-CM | POA: Diagnosis not present

## 2016-06-01 DIAGNOSIS — Z818 Family history of other mental and behavioral disorders: Secondary | ICD-10-CM | POA: Diagnosis not present

## 2016-06-07 DIAGNOSIS — S199XXA Unspecified injury of neck, initial encounter: Secondary | ICD-10-CM | POA: Diagnosis not present

## 2016-06-07 DIAGNOSIS — S060X0A Concussion without loss of consciousness, initial encounter: Secondary | ICD-10-CM | POA: Diagnosis not present

## 2016-06-07 DIAGNOSIS — G4730 Sleep apnea, unspecified: Secondary | ICD-10-CM | POA: Diagnosis not present

## 2016-06-07 DIAGNOSIS — F419 Anxiety disorder, unspecified: Secondary | ICD-10-CM | POA: Diagnosis not present

## 2016-06-07 DIAGNOSIS — R42 Dizziness and giddiness: Secondary | ICD-10-CM | POA: Diagnosis not present

## 2016-06-07 DIAGNOSIS — S0990XA Unspecified injury of head, initial encounter: Secondary | ICD-10-CM | POA: Diagnosis not present

## 2016-06-10 DIAGNOSIS — C44519 Basal cell carcinoma of skin of other part of trunk: Secondary | ICD-10-CM | POA: Diagnosis not present

## 2016-06-11 DIAGNOSIS — R42 Dizziness and giddiness: Secondary | ICD-10-CM | POA: Diagnosis not present

## 2016-06-11 DIAGNOSIS — K91872 Postprocedural seroma of a digestive system organ or structure following a digestive system procedure: Secondary | ICD-10-CM | POA: Diagnosis not present

## 2016-06-11 DIAGNOSIS — R195 Other fecal abnormalities: Secondary | ICD-10-CM | POA: Diagnosis not present

## 2016-06-15 DIAGNOSIS — F333 Major depressive disorder, recurrent, severe with psychotic symptoms: Secondary | ICD-10-CM | POA: Diagnosis not present

## 2016-06-15 DIAGNOSIS — Z818 Family history of other mental and behavioral disorders: Secondary | ICD-10-CM | POA: Diagnosis not present

## 2016-06-15 DIAGNOSIS — Z791 Long term (current) use of non-steroidal anti-inflammatories (NSAID): Secondary | ICD-10-CM | POA: Diagnosis not present

## 2016-06-15 DIAGNOSIS — Z881 Allergy status to other antibiotic agents status: Secondary | ICD-10-CM | POA: Diagnosis not present

## 2016-06-15 DIAGNOSIS — Z888 Allergy status to other drugs, medicaments and biological substances status: Secondary | ICD-10-CM | POA: Diagnosis not present

## 2016-06-15 DIAGNOSIS — Z79899 Other long term (current) drug therapy: Secondary | ICD-10-CM | POA: Diagnosis not present

## 2016-06-15 DIAGNOSIS — Z7984 Long term (current) use of oral hypoglycemic drugs: Secondary | ICD-10-CM | POA: Diagnosis not present

## 2016-06-19 ENCOUNTER — Emergency Department (HOSPITAL_COMMUNITY): Payer: Medicare Other

## 2016-06-19 ENCOUNTER — Other Ambulatory Visit: Payer: Self-pay

## 2016-06-19 ENCOUNTER — Encounter (HOSPITAL_COMMUNITY): Payer: Self-pay | Admitting: *Deleted

## 2016-06-19 ENCOUNTER — Emergency Department (HOSPITAL_COMMUNITY)
Admission: EM | Admit: 2016-06-19 | Discharge: 2016-06-20 | Disposition: A | Discharge status: Home / Self Care | Payer: Medicare Other | Attending: Emergency Medicine | Admitting: Emergency Medicine

## 2016-06-19 DIAGNOSIS — R0789 Other chest pain: Secondary | ICD-10-CM | POA: Diagnosis not present

## 2016-06-19 DIAGNOSIS — Z791 Long term (current) use of non-steroidal anti-inflammatories (NSAID): Secondary | ICD-10-CM | POA: Diagnosis not present

## 2016-06-19 DIAGNOSIS — E785 Hyperlipidemia, unspecified: Secondary | ICD-10-CM | POA: Insufficient documentation

## 2016-06-19 DIAGNOSIS — F329 Major depressive disorder, single episode, unspecified: Secondary | ICD-10-CM | POA: Diagnosis not present

## 2016-06-19 DIAGNOSIS — Z79899 Other long term (current) drug therapy: Secondary | ICD-10-CM | POA: Insufficient documentation

## 2016-06-19 DIAGNOSIS — I10 Essential (primary) hypertension: Secondary | ICD-10-CM | POA: Insufficient documentation

## 2016-06-19 DIAGNOSIS — R079 Chest pain, unspecified: Secondary | ICD-10-CM | POA: Diagnosis not present

## 2016-06-19 HISTORY — DX: Calculus of kidney: N20.0

## 2016-06-19 LAB — CBC
HEMATOCRIT: 39.2 % (ref 36.0–46.0)
Hemoglobin: 12.9 g/dL (ref 12.0–15.0)
MCH: 28 pg (ref 26.0–34.0)
MCHC: 32.9 g/dL (ref 30.0–36.0)
MCV: 85 fL (ref 78.0–100.0)
PLATELETS: 344 10*3/uL (ref 150–400)
RBC: 4.61 MIL/uL (ref 3.87–5.11)
RDW: 13.4 % (ref 11.5–15.5)
WBC: 9.1 10*3/uL (ref 4.0–10.5)

## 2016-06-19 LAB — BASIC METABOLIC PANEL
Anion gap: 6 (ref 5–15)
BUN: 10 mg/dL (ref 6–20)
CHLORIDE: 106 mmol/L (ref 101–111)
CO2: 25 mmol/L (ref 22–32)
CREATININE: 0.79 mg/dL (ref 0.44–1.00)
Calcium: 9.1 mg/dL (ref 8.9–10.3)
GFR calc Af Amer: >60 mL/min (ref >60)
GFR calc non Af Amer: >60 mL/min (ref >60)
GLUCOSE: 127 mg/dL — AB (ref 65–99)
POTASSIUM: 4.1 mmol/L (ref 3.5–5.1)
Sodium: 137 mmol/L (ref 135–145)

## 2016-06-19 LAB — I-STAT TROPONIN, ED: Troponin i, poc: 0 ng/mL (ref 0.00–0.08)

## 2016-06-19 NOTE — ED Notes (Signed)
Pt complains of chest tightness and dizziness since 4PM today. Pt states the pain started while sitting watching TV. Pt denies nausea.

## 2016-06-20 ENCOUNTER — Encounter (HOSPITAL_COMMUNITY): Payer: Self-pay | Admitting: Emergency Medicine

## 2016-06-20 DIAGNOSIS — R0789 Other chest pain: Secondary | ICD-10-CM | POA: Diagnosis not present

## 2016-06-20 LAB — I-STAT TROPONIN, ED: Troponin i, poc: 0 ng/mL (ref 0.00–0.08)

## 2016-06-20 LAB — D-DIMER, QUANTITATIVE (NOT AT ARMC): D DIMER QUANT: 0.32 ug{FEU}/mL (ref 0.00–0.50)

## 2016-06-20 NOTE — ED Provider Notes (Signed)
CSN: [id number]     Arrival date & time 06/19/16  2218 History  By signing my name below, I, Georgette Shell, attest that this documentation has been prepared under the direction and in the presence of Shanon Rosser, MD. Electronically Signed: Georgette Shell, ED Scribe. 06/20/2016. 2:40 AM.   Chief Complaint  Patient presents with  . Chest Pain    The history is provided by the patient. No language interpreter was used.    HPI Comments: [name] [name] is a 45 y.o. female with h/o HTN and hyperlipidemia who presents to the Emergency Department complaining of sudden onset, intermittent tightness and pressure underneath her left breast onset yesterday afternoon around 4pm. The first episode began while she was sitting and watching tv. Pt says she has had 3 episodes total, the last episode about 11 PM yesterday evening. These episodes last about one minute. There is associated lightheadedness and deep breathing exacerbates the pain during the episodes. Patient denies diaphoresis, shortness of breath, leg swelling and nausea.   Past Medical History  Diagnosis Date  . Heart murmur     asa child   . Depression   . Anxiety   . Iron deficiency anemia, unspecified 11/15/2013  . Hyperlipidemia   . Paranoid schizophrenia ([place])   . Hypertension     not on medication  . Hx pulmonary embolism 2013    after surgery from [place]  . Headache     migraines  . Blood dyscrasia     pt not aware  . Diarrhea, functional   . Diverticulitis   . Personality disorder   . Renal insufficiency   . Kidney stone    Past Surgical History  Procedure Laterality Date  . Other surgical history      cyst removed from ovary ? side   . Nephrolithotomy  03/31/2012    Procedure: NEPHROLITHOTOMY PERCUTANEOUS;  Surgeon: Claybon Jabs, MD;  Location: [facility];  Service: Urology;  Laterality: Left;  . Tubal ligation    . Laparotomy  07/31/2012    Procedure: EXPLORATORY LAPAROTOMY;  Surgeon: Harl Bowie, MD;  Location: [place];   Service: General;  Laterality: N/A;  REPAIR OF PANCREATIC INJURY, EXPLORATION OF RETROPERITONEUM.  [name] Colostomy  07/31/2012    Procedure: COLOSTOMY;  Surgeon: Harl Bowie, MD;  Location: [place];  Service: General;  Laterality: Right;  . Colostomy reversal    . Gws  2013    ABDOMINAL SURGERY  . Colostomy closure N/A 02/15/2013    Procedure: COLOSTOMY CLOSURE;  Surgeon: Gwenyth Ober, MD;  Location: [place];  Service: General;  Laterality: N/A;  Reversal of colostomy  . Dilatation & curettage/hysteroscopy with trueclear N/A 10/24/2013    Procedure: DILATATION & CURETTAGE/HYSTEROSCOPY WITH TRUECLEAR, CERVICAL BLOCK;  Surgeon: Marylynn Pearson, MD;  Location: [facility];  Service: Gynecology;  Laterality: N/A;  . Breast reduction Left 06/2014  . Incisional hernia repair N/A 12/24/2015    Procedure:  INCISIONAL HERNIA REPAIR WITH MESH;  Surgeon: Coralie Keens, MD;  Location: [place];  Service: General;  Laterality: N/A;  . Insertion of mesh N/A 12/24/2015    Procedure: INSERTION OF MESH;  Surgeon: Coralie Keens, MD;  Location: [facility];  Service: General;  Laterality: N/A;   Family History  Problem Relation Age of Onset  . Cancer Mother     kidney  . Depression Mother   . Other Neg Hx     adrenal problem   Social History  Substance Use Topics  .  Smoking status: Never Smoker   . Smokeless tobacco: Never Used  . Alcohol Use: No   OB History    Gravida Para Term Preterm AB TAB SAB Ectopic Multiple Living   3 2 2  1  1   2      Review of Systems A complete 10 system review of systems was obtained and all systems are negative except as noted in the HPI and PMH.   Allergies  Augmentin; Enoxaparin; Avelox; and Sodium hydroxide  Home Medications   Prior to Admission medications   Medication Sig Start Date End Date Taking? Authorizing Provider  amLODipine (NORVASC) 5 MG tablet Take 5 mg by mouth daily.   Yes Historical Provider, MD  busPIRone (BUSPAR) 10 MG tablet Take 10 mg by mouth 3  (three) times daily.   Yes Historical Provider, MD  citalopram (CELEXA) 20 MG tablet Take 20 mg by mouth daily.   Yes Historical Provider, MD  hydrOXYzine (ATARAX/VISTARIL) 50 MG tablet Take 50 mg by mouth 4 (four) times daily.   Yes Historical Provider, MD  meloxicam (MOBIC) 15 MG tablet Take 15 mg by mouth daily as needed for pain.  01/27/16  Yes Historical Provider, MD  metoprolol tartrate (LOPRESSOR) 25 MG tablet Take 25 mg by mouth 2 (two) times daily.   Yes Historical Provider, MD  omeprazole (PRILOSEC) 20 MG capsule Take 40 mg by mouth daily.   Yes Historical Provider, MD  Paliperidone Palmitate (INVEGA TRINZA IM) Inject 156 mg into the muscle every 3 (three) months.   Yes Historical Provider, MD  simvastatin (ZOCOR) 20 MG tablet Take 20 mg by mouth daily.   Yes Historical Provider, MD   BP 118/58 mmHg  Pulse 66  Temp(Src) 98.7 F (37.1 C)  Resp 17  Ht 5\' 4"  (1.626 m)  Wt 218 lb (98.884 kg)  BMI 37.40 kg/m2  SpO2 99%  LMP 06/05/2016   Physical Exam  General: Well-developed, well-nourished female in no acute distress; appearance consistent with age of record HENT: normocephalic; atraumatic Eyes: pupils equal, round and reactive to light; extraocular muscles intact Neck: supple Heart: regular rate and rhythm; no murmurs, rubs or gallops Lungs: clear to auscultation bilaterally Abdomen: soft; nondistended; nontender; no masses or hepatosplenomegaly; bowel sounds present Extremities: No deformity; full range of motion; pulses normal Neurologic: Awake, alert and oriented; motor function intact in all extremities and symmetric; no facial droop Skin: Warm and dry Psychiatric: Normal mood and affect   ED Course  Procedures (including critical care time)   EKG Interpretation   Date/Time:  Saturday June 19 2016 22:40:00 EDT Ventricular Rate:  65 PR Interval:    QRS Duration: 99 QT Interval:  473 QTC Calculation: 492 R Axis:   63 Text Interpretation:  Sinus rhythm ST elev,  probable normal early repol  pattern Borderline prolonged QT interval PVCs seen previously Confirmed by  Moorea Boissonneault  MD, Jenny Reichmann ([id number]) on 06/20/2016 2:30:11 AM      MDM   Nursing notes and vitals signs, including pulse oximetry, reviewed.  Summary of this visit's results, reviewed by myself:  Labs:  Results for orders placed or performed during the hospital encounter of 06/19/16 (from the past 24 hour(s))  Basic metabolic panel     Status: Abnormal   Collection Time: 06/19/16 11:00 PM  Result Value Ref Range   Sodium 137 135 - 145 mmol/L   Potassium 4.1 3.5 - 5.1 mmol/L   Chloride 106 101 - 111 mmol/L   CO2 25 22 - 32  mmol/L   Glucose, Bld 127 (H) 65 - 99 mg/dL   BUN 10 6 - 20 mg/dL   Creatinine, Ser 0.79 0.44 - 1.00 mg/dL   Calcium 9.1 8.9 - 10.3 mg/dL   GFR calc non Af Amer >60 >60 mL/min   GFR calc Af Amer >60 >60 mL/min   Anion gap 6 5 - 15  CBC     Status: None   Collection Time: 06/19/16 11:00 PM  Result Value Ref Range   WBC 9.1 4.0 - 10.5 K/uL   RBC 4.61 3.87 - 5.11 MIL/uL   Hemoglobin 12.9 12.0 - 15.0 g/dL   HCT 39.2 36.0 - 46.0 %   MCV 85.0 78.0 - 100.0 fL   MCH 28.0 26.0 - 34.0 pg   MCHC 32.9 30.0 - 36.0 g/dL   RDW 13.4 11.5 - 15.5 %   Platelets 344 150 - 400 K/uL  I-stat troponin, ED     Status: None   Collection Time: 06/19/16 11:08 PM  Result Value Ref Range   Troponin i, poc 0.00 0.00 - 0.08 ng/mL   Comment 3          D-dimer, quantitative (not at [facility])     Status: None   Collection Time: 06/20/16  2:55 AM  Result Value Ref Range   D-Dimer, Quant 0.32 0.00 - 0.50 ug/mL-FEU  I-stat troponin, ED     Status: None   Collection Time: 06/20/16  3:03 AM  Result Value Ref Range   Troponin i, poc 0.00 0.00 - 0.08 ng/mL   Comment 3            Imaging Studies: Dg Chest 2 View  06/19/2016  CLINICAL DATA:  Acute onset of left lower lateral chest pain. Initial encounter. EXAM: CHEST  2 VIEW COMPARISON:  Chest radiograph from 03/30/2016 FINDINGS: The lungs are  well-aerated and clear. There is no evidence of focal opacification, pleural effusion or pneumothorax. The heart is normal in size; the mediastinal contour is within normal limits. No acute osseous abnormalities are seen. IMPRESSION: No acute cardiopulmonary process seen. Electronically Signed   By: Garald Balding M.D.   On: 06/19/2016 23:00     Final diagnoses:  Atypical chest pain   I personally performed the services described in this documentation, which was scribed in my presence. The recorded information has been reviewed and is accurate.   Shanon Rosser, MD 06/20/16 [phone]

## 2016-06-21 DIAGNOSIS — R42 Dizziness and giddiness: Secondary | ICD-10-CM | POA: Diagnosis not present

## 2016-06-21 DIAGNOSIS — I10 Essential (primary) hypertension: Secondary | ICD-10-CM | POA: Diagnosis not present

## 2016-06-26 HISTORY — PX: BASAL CELL CARCINOMA EXCISION: SHX1214

## 2016-07-02 ENCOUNTER — Encounter (HOSPITAL_COMMUNITY): Payer: Self-pay | Admitting: Emergency Medicine

## 2016-07-02 ENCOUNTER — Emergency Department (HOSPITAL_COMMUNITY)
Admission: EM | Admit: 2016-07-02 | Discharge: 2016-07-03 | Disposition: A | Discharge status: Home / Self Care | Payer: Medicare Other | Attending: Emergency Medicine | Admitting: Emergency Medicine

## 2016-07-02 DIAGNOSIS — R109 Unspecified abdominal pain: Secondary | ICD-10-CM | POA: Diagnosis present

## 2016-07-02 DIAGNOSIS — I10 Essential (primary) hypertension: Secondary | ICD-10-CM | POA: Diagnosis not present

## 2016-07-02 DIAGNOSIS — R1084 Generalized abdominal pain: Secondary | ICD-10-CM

## 2016-07-02 DIAGNOSIS — K5790 Diverticulosis of intestine, part unspecified, without perforation or abscess without bleeding: Secondary | ICD-10-CM | POA: Diagnosis not present

## 2016-07-02 LAB — COMPREHENSIVE METABOLIC PANEL
ALK PHOS: 104 U/L (ref 38–126)
ALT: 31 U/L (ref 14–54)
ANION GAP: 8 (ref 5–15)
AST: 35 U/L (ref 15–41)
Albumin: 3.6 g/dL (ref 3.5–5.0)
BILIRUBIN TOTAL: 0.4 mg/dL (ref 0.3–1.2)
BUN: 11 mg/dL (ref 6–20)
CALCIUM: 9.5 mg/dL (ref 8.9–10.3)
CO2: 22 mmol/L (ref 22–32)
Chloride: 106 mmol/L (ref 101–111)
Creatinine, Ser: 1.03 mg/dL — ABNORMAL HIGH (ref 0.44–1.00)
GFR calc non Af Amer: >60 mL/min (ref >60)
GLUCOSE: 127 mg/dL — AB (ref 65–99)
Potassium: 3.8 mmol/L (ref 3.5–5.1)
Sodium: 136 mmol/L (ref 135–145)
TOTAL PROTEIN: 7.1 g/dL (ref 6.5–8.1)

## 2016-07-02 LAB — POC URINE PREG, ED: PREG TEST UR: NEGATIVE

## 2016-07-02 LAB — URINALYSIS, ROUTINE W REFLEX MICROSCOPIC
BILIRUBIN URINE: NEGATIVE
Glucose, UA: NEGATIVE mg/dL
Hgb urine dipstick: NEGATIVE
Ketones, ur: NEGATIVE mg/dL
Leukocytes, UA: NEGATIVE
NITRITE: NEGATIVE
PROTEIN: NEGATIVE mg/dL
SPECIFIC GRAVITY, URINE: 1.026 (ref 1.005–1.030)
pH: 5.5 (ref 5.0–8.0)

## 2016-07-02 LAB — CBC
HCT: 39.7 % (ref 36.0–46.0)
HEMOGLOBIN: 12.7 g/dL (ref 12.0–15.0)
MCH: 27.5 pg (ref 26.0–34.0)
MCHC: 32 g/dL (ref 30.0–36.0)
MCV: 85.9 fL (ref 78.0–100.0)
Platelets: 326 10*3/uL (ref 150–400)
RBC: 4.62 MIL/uL (ref 3.87–5.11)
RDW: 13.5 % (ref 11.5–15.5)
WBC: 8.7 10*3/uL (ref 4.0–10.5)

## 2016-07-02 LAB — LIPASE, BLOOD: Lipase: 29 U/L (ref 11–51)
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
## 2016-07-02 NOTE — ED Notes (Signed)
Pt states "I look like im pregnant, its extended and its hard. I'm having some pain here" Pt points to L side abdomen. Pt states "I started my cycle on Wednesday but im not normally this fatigued". "I felt really full". Pt in NAD, ambulatory.

## 2016-07-03 ENCOUNTER — Emergency Department (HOSPITAL_COMMUNITY): Payer: Medicare Other

## 2016-07-03 DIAGNOSIS — R1084 Generalized abdominal pain: Secondary | ICD-10-CM | POA: Diagnosis not present

## 2016-07-03 DIAGNOSIS — K5790 Diverticulosis of intestine, part unspecified, without perforation or abscess without bleeding: Secondary | ICD-10-CM | POA: Diagnosis not present

## 2016-07-03 MED ORDER — SODIUM CHLORIDE 0.9 % IV BOLUS (SEPSIS)
1000.0000 mL | Freq: Once | INTRAVENOUS | Status: AC
  Administered 2016-07-03: 1000 mL via INTRAVENOUS

## 2016-07-03 MED ORDER — IOPAMIDOL (ISOVUE-300) INJECTION 61%
INTRAVENOUS | Status: AC
  Administered 2016-07-03: 100 mL
  Filled 2016-07-03: qty 100

## 2016-07-03 MED ORDER — MORPHINE SULFATE (PF) 4 MG/ML IV SOLN
4.0000 mg | Freq: Once | INTRAVENOUS | Status: AC
  Administered 2016-07-03: 4 mg via INTRAVENOUS
  Filled 2016-07-03: qty 1

## 2016-07-03 MED ORDER — NAPROXEN 500 MG PO TABS: 500.0000 mg | ORAL_TABLET | Freq: Two times a day (BID) | ORAL | Status: DC

## 2016-07-03 NOTE — ED Provider Notes (Signed)
2:47 AM Patient with CT scan negative for acute/emergent/surgical process. Patient well appearing on reevaluation; states she just feels tired. Labs reassuring. Will d/c with instructions for supportive care. Rx given for Naproxen. Return precautions discussed and provided. Patient discharged in satisfactory condition with no unaddressed concerns.   Results for orders placed or performed during the hospital encounter of 07/02/16  Lipase, blood  Result Value Ref Range   Lipase 29 11 - 51 U/L  Comprehensive metabolic panel  Result Value Ref Range   Sodium 136 135 - 145 mmol/L   Potassium 3.8 3.5 - 5.1 mmol/L   Chloride 106 101 - 111 mmol/L   CO2 22 22 - 32 mmol/L   Glucose, Bld 127 (H) 65 - 99 mg/dL   BUN 11 6 - 20 mg/dL   Creatinine, Ser 1.03 (H) 0.44 - 1.00 mg/dL   Calcium 9.5 8.9 - 10.3 mg/dL   Total Protein 7.1 6.5 - 8.1 g/dL   Albumin 3.6 3.5 - 5.0 g/dL   AST 35 15 - 41 U/L   ALT 31 14 - 54 U/L   Alkaline Phosphatase 104 38 - 126 U/L   Total Bilirubin 0.4 0.3 - 1.2 mg/dL   GFR calc non Af Amer >60 >60 mL/min   GFR calc Af Amer >60 >60 mL/min   Anion gap 8 5 - 15  CBC  Result Value Ref Range   WBC 8.7 4.0 - 10.5 K/uL   RBC 4.62 3.87 - 5.11 MIL/uL   Hemoglobin 12.7 12.0 - 15.0 g/dL   HCT 39.7 36.0 - 46.0 %   MCV 85.9 78.0 - 100.0 fL   MCH 27.5 26.0 - 34.0 pg   MCHC 32.0 30.0 - 36.0 g/dL   RDW 13.5 11.5 - 15.5 %   Platelets 326 150 - 400 K/uL  Urinalysis, Routine w reflex microscopic  Result Value Ref Range   Color, Urine YELLOW YELLOW   APPearance HAZY (A) CLEAR   Specific Gravity, Urine 1.026 1.005 - 1.030   pH 5.5 5.0 - 8.0   Glucose, UA NEGATIVE NEGATIVE mg/dL   Hgb urine dipstick NEGATIVE NEGATIVE   Bilirubin Urine NEGATIVE NEGATIVE   Ketones, ur NEGATIVE NEGATIVE mg/dL   Protein, ur NEGATIVE NEGATIVE mg/dL   Nitrite NEGATIVE NEGATIVE   Leukocytes, UA NEGATIVE NEGATIVE  POC urine preg, ED  Result Value Ref Range   Preg Test, Ur NEGATIVE NEGATIVE   Dg Chest  2 View  06/19/2016  CLINICAL DATA:  Acute onset of left lower lateral chest pain. Initial encounter. EXAM: CHEST  2 VIEW COMPARISON:  Chest radiograph from 03/30/2016 FINDINGS: The lungs are well-aerated and clear. There is no evidence of focal opacification, pleural effusion or pneumothorax. The heart is normal in size; the mediastinal contour is within normal limits. No acute osseous abnormalities are seen. IMPRESSION: No acute cardiopulmonary process seen. Electronically Signed   By: Garald Balding M.D.   On: 06/19/2016 23:00   Ct Abdomen Pelvis W Contrast  07/03/2016  CLINICAL DATA:  Right upper quadrant pain.  Abdominal distension. EXAM: CT ABDOMEN AND PELVIS WITH CONTRAST TECHNIQUE: Multidetector CT imaging of the abdomen and pelvis was performed using the standard protocol following bolus administration of intravenous contrast. CONTRAST:  187mL ISOVUE-300 IOPAMIDOL (ISOVUE-300) INJECTION 61% COMPARISON:  Most recent CT 01/01/2016, multiple priors reviewed FINDINGS: Lower chest: Tiny right pleural effusion. Minimal subpleural thickening bilaterally. Liver: No focal lesion. Hepatobiliary: Gallbladder physiologically distended, no calcified stone. No biliary dilatation. Pancreas: No ductal dilatation or inflammation. Spleen:  Normal. Adrenal glands: No nodule. Kidneys: Left renal scarring and parenchymal atrophy. Chronic dilatation of left renal collecting system, with mild increase from most recent prior. This has been waxing and waning. 6 mm nonobstructing stone in the lower left kidney. Unchanged cortical hypodensity in the medial left kidney. Small cyst in the interpolar right kidney, question cortical scarring versus cyst in the lower right kidney. Stomach/Bowel: Stomach physiologically distended. There are no dilated or thickened small bowel loops. Postsurgical change of the anterior upper abdominal wall with small bowel loops opposing the anterior reflection, no inflammation or wall thickening. Moderate  volume of stool throughout the colon without colonic wall thickening. Multifocal colonic diverticulosis, no diverticulitis. The appendix is normal. Vascular/Lymphatic: No retroperitoneal adenopathy. Abdominal aorta is normal in caliber. Reproductive: The uterus and adnexa are normal for age. Tampon in place. Bladder: Physiologically distended without wall thickening. Other: Postsurgical changes of the anterior abdominal wall. There is a thin crescentic fluid collection in the upper anterior abdominal wall subcutaneous tissue at site of prior mesh consistent with seroma. This measures 1 cm in maximal depth. No intra-abdominal fluid collection. No free air or ascites. Musculoskeletal: There are no acute or suspicious osseous abnormalities. IMPRESSION: 1. No acute abnormality in the abdomen/pelvis. 2. Postsurgical change of the anterior abdominal wall with thin seroma in the subcutaneous tissues. Small bowel loops in the upper abdomen oppose the anterior reflection, however no inflammation, obstruction or complicating features at this time. 3. 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 Nonobstructing left renal stone. 4. Scattered colonic diverticulosis without diverticulitis. Electronically Signed   By: Jeb Levering M.D.   On: 07/03/2016 02:17      Antonietta Breach, PA-C 07/03/16 [place], MD 07/03/16 [phone]

## 2016-07-03 NOTE — Discharge Instructions (Signed)

## 2016-07-03 NOTE — ED Notes (Signed)
Patient transported to CT 

## 2016-07-07 DIAGNOSIS — Z881 Allergy status to other antibiotic agents status: Secondary | ICD-10-CM | POA: Diagnosis not present

## 2016-07-07 DIAGNOSIS — G4730 Sleep apnea, unspecified: Secondary | ICD-10-CM | POA: Diagnosis not present

## 2016-07-07 DIAGNOSIS — Z880 Allergy status to penicillin: Secondary | ICD-10-CM | POA: Diagnosis not present

## 2016-07-07 DIAGNOSIS — F333 Major depressive disorder, recurrent, severe with psychotic symptoms: Secondary | ICD-10-CM | POA: Diagnosis not present

## 2016-07-07 DIAGNOSIS — Z888 Allergy status to other drugs, medicaments and biological substances status: Secondary | ICD-10-CM | POA: Diagnosis not present

## 2016-07-07 DIAGNOSIS — Z85828 Personal history of other malignant neoplasm of skin: Secondary | ICD-10-CM | POA: Diagnosis not present

## 2016-07-07 DIAGNOSIS — Z79899 Other long term (current) drug therapy: Secondary | ICD-10-CM | POA: Diagnosis not present

## 2016-07-07 DIAGNOSIS — Z791 Long term (current) use of non-steroidal anti-inflammatories (NSAID): Secondary | ICD-10-CM | POA: Diagnosis not present

## 2016-07-13 DIAGNOSIS — F419 Anxiety disorder, unspecified: Secondary | ICD-10-CM | POA: Diagnosis not present

## 2016-07-13 DIAGNOSIS — K649 Unspecified hemorrhoids: Secondary | ICD-10-CM | POA: Diagnosis not present

## 2016-07-13 DIAGNOSIS — F29 Unspecified psychosis not due to a substance or known physiological condition: Secondary | ICD-10-CM | POA: Diagnosis not present

## 2016-07-13 DIAGNOSIS — E559 Vitamin D deficiency, unspecified: Secondary | ICD-10-CM | POA: Diagnosis not present

## 2016-07-13 DIAGNOSIS — G4733 Obstructive sleep apnea (adult) (pediatric): Secondary | ICD-10-CM | POA: Diagnosis not present

## 2016-07-13 DIAGNOSIS — R45851 Suicidal ideations: Secondary | ICD-10-CM | POA: Diagnosis not present

## 2016-07-13 DIAGNOSIS — I10 Essential (primary) hypertension: Secondary | ICD-10-CM | POA: Diagnosis not present

## 2016-07-13 DIAGNOSIS — F333 Major depressive disorder, recurrent, severe with psychotic symptoms: Secondary | ICD-10-CM | POA: Diagnosis not present

## 2016-07-14 DIAGNOSIS — F333 Major depressive disorder, recurrent, severe with psychotic symptoms: Secondary | ICD-10-CM | POA: Diagnosis not present

## 2016-07-14 DIAGNOSIS — R45851 Suicidal ideations: Secondary | ICD-10-CM | POA: Diagnosis not present

## 2016-07-15 DIAGNOSIS — D509 Iron deficiency anemia, unspecified: Secondary | ICD-10-CM | POA: Diagnosis not present

## 2016-07-15 DIAGNOSIS — E559 Vitamin D deficiency, unspecified: Secondary | ICD-10-CM | POA: Diagnosis not present

## 2016-07-15 DIAGNOSIS — Z888 Allergy status to other drugs, medicaments and biological substances status: Secondary | ICD-10-CM | POA: Diagnosis not present

## 2016-07-15 DIAGNOSIS — F333 Major depressive disorder, recurrent, severe with psychotic symptoms: Secondary | ICD-10-CM | POA: Diagnosis not present

## 2016-07-15 DIAGNOSIS — Z85828 Personal history of other malignant neoplasm of skin: Secondary | ICD-10-CM | POA: Diagnosis not present

## 2016-07-15 DIAGNOSIS — Z880 Allergy status to penicillin: Secondary | ICD-10-CM | POA: Diagnosis not present

## 2016-07-15 DIAGNOSIS — G4733 Obstructive sleep apnea (adult) (pediatric): Secondary | ICD-10-CM | POA: Diagnosis not present

## 2016-07-15 DIAGNOSIS — K649 Unspecified hemorrhoids: Secondary | ICD-10-CM | POA: Diagnosis not present

## 2016-07-15 DIAGNOSIS — F332 Major depressive disorder, recurrent severe without psychotic features: Secondary | ICD-10-CM | POA: Diagnosis not present

## 2016-07-15 DIAGNOSIS — Z881 Allergy status to other antibiotic agents status: Secondary | ICD-10-CM | POA: Diagnosis not present

## 2016-07-15 DIAGNOSIS — F419 Anxiety disorder, unspecified: Secondary | ICD-10-CM | POA: Diagnosis not present

## 2016-07-15 DIAGNOSIS — R45851 Suicidal ideations: Secondary | ICD-10-CM | POA: Diagnosis not present

## 2016-07-20 DIAGNOSIS — R319 Hematuria, unspecified: Secondary | ICD-10-CM | POA: Diagnosis not present

## 2016-07-20 DIAGNOSIS — R569 Unspecified convulsions: Secondary | ICD-10-CM | POA: Diagnosis not present

## 2016-07-20 DIAGNOSIS — I10 Essential (primary) hypertension: Secondary | ICD-10-CM | POA: Diagnosis not present

## 2016-07-20 DIAGNOSIS — N132 Hydronephrosis with renal and ureteral calculous obstruction: Secondary | ICD-10-CM | POA: Diagnosis not present

## 2016-07-20 DIAGNOSIS — K921 Melena: Secondary | ICD-10-CM | POA: Diagnosis not present

## 2016-07-20 DIAGNOSIS — F259 Schizoaffective disorder, unspecified: Secondary | ICD-10-CM | POA: Diagnosis not present

## 2016-07-20 DIAGNOSIS — Z8249 Family history of ischemic heart disease and other diseases of the circulatory system: Secondary | ICD-10-CM | POA: Diagnosis not present

## 2016-07-20 DIAGNOSIS — M549 Dorsalgia, unspecified: Secondary | ICD-10-CM | POA: Diagnosis not present

## 2016-07-20 DIAGNOSIS — N261 Atrophy of kidney (terminal): Secondary | ICD-10-CM | POA: Diagnosis not present

## 2016-07-20 DIAGNOSIS — E119 Type 2 diabetes mellitus without complications: Secondary | ICD-10-CM | POA: Diagnosis not present

## 2016-07-20 DIAGNOSIS — K5730 Diverticulosis of large intestine without perforation or abscess without bleeding: Secondary | ICD-10-CM | POA: Diagnosis not present

## 2016-07-20 DIAGNOSIS — F603 Borderline personality disorder: Secondary | ICD-10-CM | POA: Diagnosis not present

## 2016-07-20 DIAGNOSIS — F319 Bipolar disorder, unspecified: Secondary | ICD-10-CM | POA: Diagnosis not present

## 2016-07-20 DIAGNOSIS — R109 Unspecified abdominal pain: Secondary | ICD-10-CM | POA: Diagnosis not present

## 2016-07-20 DIAGNOSIS — Z888 Allergy status to other drugs, medicaments and biological substances status: Secondary | ICD-10-CM | POA: Diagnosis not present

## 2016-07-20 DIAGNOSIS — K660 Peritoneal adhesions (postprocedural) (postinfection): Secondary | ICD-10-CM | POA: Diagnosis not present

## 2016-07-23 DIAGNOSIS — T424X2A Poisoning by benzodiazepines, intentional self-harm, initial encounter: Secondary | ICD-10-CM | POA: Diagnosis present

## 2016-07-23 DIAGNOSIS — R1110 Vomiting, unspecified: Secondary | ICD-10-CM | POA: Diagnosis not present

## 2016-07-23 DIAGNOSIS — R0602 Shortness of breath: Secondary | ICD-10-CM | POA: Diagnosis not present

## 2016-07-23 DIAGNOSIS — G92 Toxic encephalopathy: Secondary | ICD-10-CM | POA: Diagnosis not present

## 2016-07-23 DIAGNOSIS — I10 Essential (primary) hypertension: Secondary | ICD-10-CM | POA: Diagnosis present

## 2016-07-23 DIAGNOSIS — Z6837 Body mass index (BMI) 37.0-37.9, adult: Secondary | ICD-10-CM | POA: Diagnosis not present

## 2016-07-23 DIAGNOSIS — E669 Obesity, unspecified: Secondary | ICD-10-CM | POA: Diagnosis not present

## 2016-07-23 DIAGNOSIS — T50901A Poisoning by unspecified drugs, medicaments and biological substances, accidental (unintentional), initial encounter: Secondary | ICD-10-CM | POA: Diagnosis not present

## 2016-07-23 DIAGNOSIS — R4182 Altered mental status, unspecified: Secondary | ICD-10-CM | POA: Diagnosis not present

## 2016-07-23 DIAGNOSIS — E782 Mixed hyperlipidemia: Secondary | ICD-10-CM | POA: Diagnosis present

## 2016-07-23 DIAGNOSIS — R45851 Suicidal ideations: Secondary | ICD-10-CM | POA: Diagnosis not present

## 2016-07-23 DIAGNOSIS — R55 Syncope and collapse: Secondary | ICD-10-CM | POA: Diagnosis not present

## 2016-07-23 DIAGNOSIS — T391X2A Poisoning by 4-Aminophenol derivatives, intentional self-harm, initial encounter: Secondary | ICD-10-CM | POA: Diagnosis not present

## 2016-07-23 DIAGNOSIS — N200 Calculus of kidney: Secondary | ICD-10-CM | POA: Diagnosis not present

## 2016-07-23 DIAGNOSIS — T43212A Poisoning by selective serotonin and norepinephrine reuptake inhibitors, intentional self-harm, initial encounter: Secondary | ICD-10-CM | POA: Diagnosis present

## 2016-07-23 DIAGNOSIS — T391X1A Poisoning by 4-Aminophenol derivatives, accidental (unintentional), initial encounter: Secondary | ICD-10-CM

## 2016-07-23 DIAGNOSIS — L7634 Postprocedural seroma of skin and subcutaneous tissue following other procedure: Secondary | ICD-10-CM | POA: Diagnosis not present

## 2016-07-23 DIAGNOSIS — K219 Gastro-esophageal reflux disease without esophagitis: Secondary | ICD-10-CM | POA: Diagnosis present

## 2016-07-23 DIAGNOSIS — S199XXA Unspecified injury of neck, initial encounter: Secondary | ICD-10-CM | POA: Diagnosis not present

## 2016-07-23 DIAGNOSIS — T50904A Poisoning by unspecified drugs, medicaments and biological substances, undetermined, initial encounter: Secondary | ICD-10-CM | POA: Diagnosis not present

## 2016-07-23 DIAGNOSIS — F329 Major depressive disorder, single episode, unspecified: Secondary | ICD-10-CM | POA: Diagnosis not present

## 2016-07-23 DIAGNOSIS — Z79899 Other long term (current) drug therapy: Secondary | ICD-10-CM | POA: Diagnosis not present

## 2016-07-24 DIAGNOSIS — R45851 Suicidal ideations: Secondary | ICD-10-CM

## 2016-07-24 DIAGNOSIS — T391X1A Poisoning by 4-Aminophenol derivatives, accidental (unintentional), initial encounter: Secondary | ICD-10-CM

## 2016-07-24 DIAGNOSIS — F329 Major depressive disorder, single episode, unspecified: Secondary | ICD-10-CM

## 2016-07-24 DIAGNOSIS — E669 Obesity, unspecified: Secondary | ICD-10-CM

## 2016-07-24 DIAGNOSIS — T50901A Poisoning by unspecified drugs, medicaments and biological substances, accidental (unintentional), initial encounter: Secondary | ICD-10-CM

## 2016-07-28 ENCOUNTER — Encounter (HOSPITAL_COMMUNITY): Payer: Self-pay | Admitting: Mental Health

## 2016-07-28 NOTE — BH Assessment (Addendum)
Tele Assessment Note  [name] [name] is an 45 y.o. female, the following was obtained from the patients notes at [facility], tele-assessment: "Pt will not talk to the writer. Information obtained from [facility] staff notes. Pt overdosed on Percocet, Trazodone, Ativan, and Tylenol. Pt informed [name] that she been suicidal for the past 2 days. Pt has a hx of depression and ECT Treatment. Pt has previous SI attempts. Pt was found with a telephone cord around her neck per [facility] staff. 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 Anxiety   . Blood dyscrasia    pt not aware  . Depression   . Diarrhea, functional   . Diverticulitis   . Headache    migraines  . Heart murmur    asa child   . Hx pulmonary embolism 2013   after surgery from [place]  . Hyperlipidemia   . Hypertension    not on medication  . Iron deficiency anemia, unspecified 11/15/2013  . Kidney stone   . Paranoid schizophrenia ([place])   . Personality disorder   . Renal insufficiency     Past Surgical History:  Procedure Laterality Date  . Breast Reduction Left 06/2014  . COLOSTOMY  07/31/2012   Procedure: COLOSTOMY;  Surgeon: Harl Bowie, MD;  Location: [place];  Service: General;  Laterality: Right;  . COLOSTOMY CLOSURE N/A 02/15/2013   Procedure: COLOSTOMY CLOSURE;  Surgeon: Gwenyth Ober, MD;  Location: [place];  Service: General;  Laterality: N/A;  Reversal of colostomy  . COLOSTOMY REVERSAL    . DILATATION & CURETTAGE/HYSTEROSCOPY WITH TRUECLEAR N/A 10/24/2013   Procedure: DILATATION & CURETTAGE/HYSTEROSCOPY WITH TRUECLEAR, CERVICAL BLOCK;  Surgeon: Marylynn Pearson, MD;  Location: [facility] [facility];  Service: Gynecology;  Laterality: N/A;  . [place]  2013   ABDOMINAL SURGERY  . INCISIONAL  HERNIA REPAIR N/A 12/24/2015   Procedure:  INCISIONAL HERNIA REPAIR WITH MESH;  Surgeon: Coralie Keens, MD;  Location: [place];  Service: General;  Laterality: N/A;  . INSERTION OF MESH N/A 12/24/2015   Procedure: INSERTION OF MESH;  Surgeon: Coralie Keens, MD;  Location: [place];  Service: General;  Laterality: N/A;  . LAPAROTOMY  07/31/2012   Procedure: EXPLORATORY LAPAROTOMY;  Surgeon: Harl Bowie, MD;  Location: [place];  Service: General;  Laterality: N/A;  REPAIR OF PANCREATIC INJURY, EXPLORATION OF RETROPERITONEUM.  [name] NEPHROLITHOTOMY  03/31/2012   Procedure: NEPHROLITHOTOMY PERCUTANEOUS;  Surgeon: Claybon Jabs, MD;  Location: [facility];  Service: Urology;  Laterality: Left;  . OTHER SURGICAL HISTORY     cyst removed from ovary ? side   . TUBAL LIGATION      Family History:  Family History  Problem Relation Age of Onset  . Cancer Mother     kidney  . Depression Mother   . Other Neg Hx     adrenal problem    Social History:  reports that she has never smoked. She has never used smokeless tobacco. She reports that she does not drink alcohol or use drugs.  Additional Social History:  Alcohol / Drug Use Pain Medications: See MAR. Prescriptions: See MAR.  Over the Counter: See MAR. History of alcohol / drug use?: No history of alcohol / drug abuse  CIWA:   COWS:  PATIENT STRENGTHS: (choose at least two) Average or above average intelligence Capable of independent living  Allergies:  Allergies  Allergen Reactions  . Augmentin [Amoxicillin-Pot Clavulanate] Itching, Swelling and Rash    Has patient had a PCN reaction causing immediate rash, facial/tongue/throat swelling, SOB or lightheadedness with hypotension: Yes Has patient had a PCN reaction causing severe rash involving mucus membranes or skin necrosis: No Has patient had a PCN reaction that required hospitalization No Has patient had a PCN reaction occurring within the last 10 years: Yes If all of the above  answers are "NO", then may proceed with Cephalosporin use.  . Enoxaparin Hives  . Avelox [Moxifloxacin Hcl In Nacl] Itching, Swelling and Rash  . Sodium Hydroxide Rash    Home Medications:  (Not in a hospital admission)  OB/GYN Status:  Patient's last menstrual period was 06/30/2016.  General Assessment Data Location of Assessment: [facility] Services TTS Assessment: Out of system Is this a Tele or Face-to-Face Assessment?: Tele Assessment Is this an Initial Assessment or a Re-assessment for this encounter?: Initial Assessment Marital status: Divorced Is patient pregnant?: No Pregnancy Status: No Living Arrangements: Alone (with family ) Can pt return to current living arrangement?: Yes Admission Status: Voluntary Is patient capable of signing voluntary admission?: Yes Referral Source: Other (911 ) Insurance type: Medicare     Crisis Care Plan Living Arrangements: Alone (with family ) Legal Guardian: Other: (Self) Name of Psychiatrist: UTA Name of Therapist: UTA  Education Status Is patient currently in school?:  (UTA) Current Grade: NA Highest grade of school patient has completed: [employer] Name of school: NA Contact person: NA  Risk to self with the past 6 months Suicidal Ideation: Yes-Currently Present Suicidal Intent: Yes-Currently Present Has patient had any suicidal intent within the past 6 months prior to admission? : Yes Is patient at risk for suicide?: Yes Suicidal Plan?: Yes-Currently Present Has patient had any suicidal plan within the past 6 months prior to admission? : Other (comment) (UTA) Specify Current Suicidal Plan: Pt took an overdose on multiple medications.  Access to Means: Yes (Pt took medications.) Specify Access to Suicidal Means: Pt took an overdose on multiple medications.  What has been your use of drugs/alcohol within the last 12 months?: UTA Previous Attempts/Gestures:  (UTA) How many times?: 0 Other Self Harm Risks: UTA Triggers for  Past Attempts: Unknown Intentional Self Injurious Behavior:  (UTA) Family Suicide History: Unknown Recent stressful life event(s): Other (Comment) (None) Persecutory voices/beliefs?:  (UTA) Depression:  (Pt has hx of depression.) Depression Symptoms:  (UTA) Substance abuse history and/or treatment for substance abuse?:  (Unknown) Suicide prevention information given to non-admitted patients: Not applicable  Risk to Others within the past 6 months Homicidal Ideation: No Does patient have any lifetime risk of violence toward others beyond the six months prior to admission? : Unknown Thoughts of Harm to Others: No (Unknown) Current Homicidal Intent: No (UTA) Current Homicidal Plan: No (Unknown) Access to Homicidal Means:  (UTA) History of harm to others?:  (Unknown) Assessment of Violence: None Noted Violent Behavior Description: NA Does patient have access to weapons?: No (UTA) Criminal Charges Pending?: No Does patient have a court date: No Is patient on probation?: No  Psychosis Hallucinations:  (Pt reports none.) Delusions:  (Pt reports none.)  Mental Status Report Appearance/Hygiene: Unable to Assess Eye Contact: Fair Motor Activity: Unremarkable Speech: Logical/coherent Level of Consciousness: Unable to assess Mood: Sad Affect: Appropriate to circumstance Anxiety Level: None (UTA) Thought Processes: Circumstantial Judgement: Impaired Orientation:  Situation, Time, Place, Person Obsessive Compulsive Thoughts/Behaviors: Unable to Assess  Cognitive Functioning Concentration: Poor Memory: Recent Intact, Recent Impaired IQ: Average Insight: Poor Impulse Control: Poor Appetite: Fair Weight Loss: 0 Weight Gain: 0 Sleep: Unable to Assess Total Hours of Sleep:  (UTA) Vegetative Symptoms: Unable to Assess  ADLScreening [facility] Assessment Services) Patient's cognitive ability adequate to safely complete daily activities?: Yes Patient able to express need for assistance  with ADLs?: Yes Independently performs ADLs?: Yes (appropriate for developmental age)  Prior Inpatient Therapy Prior Inpatient Therapy:  (Unknown) Prior Therapy Dates:  (UTA) Prior Therapy Facilty/Provider(s):  (UTA) Reason for Treatment: Unknown  Prior Outpatient Therapy Prior Outpatient Therapy: Yes (ECT Therapy) Prior Therapy Dates: Unknown Prior Therapy Facilty/Provider(s): Unknown Reason for Treatment: Unknown Does patient have an ACCT team?: Unknown Does patient have Intensive In-House Services?  : Unknown Does patient have [facility] services? : Unknown Does patient have P4CC services?: Unknown  ADL Screening (condition at time of admission) Patient's cognitive ability adequate to safely complete daily activities?: Yes Is the patient deaf or have difficulty hearing?: No Does the patient have difficulty seeing, even when wearing glasses/contacts?: No Does the patient have difficulty concentrating, remembering, or making decisions?: No Patient able to express need for assistance with ADLs?: Yes Does the patient have difficulty dressing or bathing?: No Independently performs ADLs?: Yes (appropriate for developmental age) Does the patient have difficulty walking or climbing stairs?: No Weakness of Legs: None Weakness of Arms/Hands: None  Home Assistive Devices/Equipment Home Assistive Devices/Equipment: None    Abuse/Neglect Assessment (Assessment to be complete while patient is alone) Physical Abuse: Denies Verbal Abuse: Denies Sexual Abuse: Denies     Advance Directives (For Healthcare) Does patient have an advance directive?: No Would patient like information on creating an advanced directive?: No - patient declined information    Additional Information 1:1 In Past 12 Months?: No CIRT Risk: No Elopement Risk: No Does patient have medical clearance?: Yes     Disposition:  Disposition Initial Assessment Completed for this Encounter: Yes Disposition of  Patient: Other dispositions (Inpatient)  Edd Fabian 07/29/2016 12:07 AM  Edd Fabian, MS, LPC, CRC 07/28/16

## 2016-07-29 ENCOUNTER — Inpatient Hospital Stay (HOSPITAL_COMMUNITY)
Admission: AD | Admit: 2016-07-29 | Discharge: 2016-08-03 | DRG: 885 | Disposition: A | Discharge status: Home / Self Care | Payer: Medicare Other | Attending: Psychiatry | Admitting: Psychiatry

## 2016-07-29 ENCOUNTER — Encounter (HOSPITAL_COMMUNITY): Payer: Self-pay

## 2016-07-29 DIAGNOSIS — Z87442 Personal history of urinary calculi: Secondary | ICD-10-CM | POA: Diagnosis not present

## 2016-07-29 DIAGNOSIS — Z8659 Personal history of other mental and behavioral disorders: Secondary | ICD-10-CM

## 2016-07-29 DIAGNOSIS — G4700 Insomnia, unspecified: Secondary | ICD-10-CM | POA: Diagnosis present

## 2016-07-29 DIAGNOSIS — F4310 Post-traumatic stress disorder, unspecified: Secondary | ICD-10-CM | POA: Diagnosis present

## 2016-07-29 DIAGNOSIS — Z62810 Personal history of physical and sexual abuse in childhood: Secondary | ICD-10-CM | POA: Diagnosis not present

## 2016-07-29 DIAGNOSIS — E785 Hyperlipidemia, unspecified: Secondary | ICD-10-CM | POA: Diagnosis present

## 2016-07-29 DIAGNOSIS — Z933 Colostomy status: Secondary | ICD-10-CM

## 2016-07-29 DIAGNOSIS — I129 Hypertensive chronic kidney disease with stage 1 through stage 4 chronic kidney disease, or unspecified chronic kidney disease: Secondary | ICD-10-CM | POA: Diagnosis present

## 2016-07-29 DIAGNOSIS — F250 Schizoaffective disorder, bipolar type: Principal | ICD-10-CM

## 2016-07-29 DIAGNOSIS — Z8051 Family history of malignant neoplasm of kidney: Secondary | ICD-10-CM | POA: Diagnosis not present

## 2016-07-29 DIAGNOSIS — F609 Personality disorder, unspecified: Secondary | ICD-10-CM | POA: Diagnosis present

## 2016-07-29 DIAGNOSIS — Z86711 Personal history of pulmonary embolism: Secondary | ICD-10-CM

## 2016-07-29 DIAGNOSIS — N189 Chronic kidney disease, unspecified: Secondary | ICD-10-CM | POA: Diagnosis present

## 2016-07-29 DIAGNOSIS — Z818 Family history of other mental and behavioral disorders: Secondary | ICD-10-CM

## 2016-07-29 DIAGNOSIS — F259 Schizoaffective disorder, unspecified: Secondary | ICD-10-CM | POA: Diagnosis present

## 2016-07-29 DIAGNOSIS — I10 Essential (primary) hypertension: Secondary | ICD-10-CM | POA: Diagnosis present

## 2016-07-29 MED ORDER — AMLODIPINE BESYLATE 5 MG PO TABS
5.0000 mg | ORAL_TABLET | Freq: Every day | ORAL | Status: DC
  Administered 2016-07-29 – 2016-08-03 (×6): 5 mg via ORAL
  Filled 2016-07-29 (×10): qty 1

## 2016-07-29 MED ORDER — HYDROXYZINE HCL 25 MG PO TABS
25.0000 mg | ORAL_TABLET | Freq: Three times a day (TID) | ORAL | Status: DC | PRN
  Administered 2016-07-29 – 2016-07-30 (×2): 25 mg via ORAL
  Filled 2016-07-29 (×3): qty 1

## 2016-07-29 MED ORDER — MAGNESIUM HYDROXIDE 400 MG/5ML PO SUSP: 30.0000 mL | Freq: Every day | ORAL | Status: DC | PRN

## 2016-07-29 MED ORDER — LORAZEPAM 2 MG/ML IJ SOLN: 1.0000 mg | Freq: Four times a day (QID) | INTRAMUSCULAR | Status: DC | PRN

## 2016-07-29 MED ORDER — ALUM & MAG HYDROXIDE-SIMETH 200-200-20 MG/5ML PO SUSP: 30.0000 mL | ORAL | Status: DC | PRN

## 2016-07-29 MED ORDER — SIMVASTATIN 20 MG PO TABS
20.0000 mg | ORAL_TABLET | Freq: Every day | ORAL | Status: DC
  Administered 2016-07-29 – 2016-08-02 (×5): 20 mg via ORAL
  Filled 2016-07-29 (×8): qty 1

## 2016-07-29 MED ORDER — CARBAMAZEPINE 200 MG PO TABS
100.0000 mg | ORAL_TABLET | Freq: Three times a day (TID) | ORAL | Status: DC
  Administered 2016-07-29 – 2016-07-30 (×2): 100 mg via ORAL
  Filled 2016-07-29: qty 1
  Filled 2016-07-29: qty 0.5
  Filled 2016-07-29: qty 1
  Filled 2016-07-29 (×4): qty 0.5
  Filled 2016-07-29: qty 1

## 2016-07-29 MED ORDER — LORAZEPAM 1 MG PO TABS
1.0000 mg | ORAL_TABLET | Freq: Four times a day (QID) | ORAL | Status: DC | PRN
  Administered 2016-07-29 – 2016-08-03 (×4): 1 mg via ORAL
  Filled 2016-07-29 (×4): qty 1

## 2016-07-29 MED ORDER — ACETAMINOPHEN 325 MG PO TABS: 650.0000 mg | ORAL_TABLET | Freq: Four times a day (QID) | ORAL | Status: DC | PRN

## 2016-07-29 MED ORDER — HYDROXYZINE HCL 50 MG PO TABS
50.0000 mg | ORAL_TABLET | Freq: Every day | ORAL | Status: DC
  Administered 2016-07-29 – 2016-08-02 (×5): 50 mg via ORAL
  Filled 2016-07-29 (×8): qty 1

## 2016-07-29 MED ORDER — TRAZODONE HCL 50 MG PO TABS
50.0000 mg | ORAL_TABLET | Freq: Every day | ORAL | Status: DC
  Administered 2016-07-29 – 2016-08-01 (×4): 50 mg via ORAL
  Filled 2016-07-29 (×7): qty 1
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
## 2016-07-29 NOTE — Progress Notes (Signed)
Patient did not attend karaoke group tonight. Patient remains under unit restriction due to being on a 1:1.

## 2016-07-29 NOTE — Progress Notes (Signed)
1:1 note  Pt was anxious earlier tonight.  PRN medication was administered for anxiety.  Pt was guarded, particularly with female staff.  Pt denies pain, denies hallucinations.    Pt is currently resting in bed with eyes closed.  Respirations are even and unlabored.  Pt is in no apparent distress.  1:1 staff remains with pt for safety.  Will continue to monitor and assess.

## 2016-07-29 NOTE — Progress Notes (Addendum)
Patient was admitted to the unit from [facility]. Patient admits to an attempted to kill herself on Friday July 28th 20017. She admits to taking, "Three full bottles of medications". Medications were percocet, ativan and trazodone. Patient reports to, "Waking up in ICU on Saturday evening".  See currently denies SI/HI/AVH. She states that, "I don't want to do that to my daughter. I now know how it affects her. I saw it when she visited me in the hospital".  HX: multiple serious SA, including using the phone cord to hang herself. HX: Psychosis, including A/VH., paranoia. Currently reports depression with decrease in appetite, anxiety, crying spells, hopelessness, insomnia, isolation, loneliness, panic attacks, and worthlessness.  Patient reports current stressors as: Recent divorce, daughter has begun to live with her after significant time away, son returning from a tour of duty [place], and his son saying she was not welcome at his wedding. Skin assessment notes multiple scars to trunk including:Under breasts from a breast lift/reduction, abdominal of appprox 12 inches from just under sternum to just above pubis, multiple dime sized all over trunk, dime sized on left lower back and a large bruise, of 2 inch x 2 inch on left side, turning purple. One to one precautions begun, at 12 noon, with staff within close proximity at all times.Patient oriented to the unit.

## 2016-07-29 NOTE — BHH Group Notes (Signed)
[facility] Group Notes:  (Counselor/Nursing/MHT/Case Management/Adjunct)  07/29/2016 1:15PM  Type of Therapy:  Group Therapy  Participation Level:  Active  Participation Quality:  Appropriate  Affect:  Flat  Cognitive:  Oriented  Insight:  Improving  Engagement in Group:  Limited  Engagement in Therapy:  Limited  Modes of Intervention:  Discussion, Exploration and Socialization  Summary of Progress/Problems: The topic for group was balance in life.  Pt participated in the discussion about when their life was in balance and out of balance and how this feels.  Pt discussed ways to get back in balance and short term goals they can work on to get where they want to be. Stayed the entire time, engaged throughout, but quiet, subdued.  Stated she feels unbalanced because of "trials that feel like they are too much.  I am lacking the tools to deal with them."  Talked about the app she uses for meditation called "step, breath, think'" and stated that even though it only goes on for 8 minutes or so, it helps her find balance "almost every time."   Trish Mage 07/29/2016 4:47 PM

## 2016-07-29 NOTE — Progress Notes (Signed)
1:1 note Patient has been within close proximity with staff and always within eyesight since 12 noon. She has been mostly in the common room, and has been appropriate and calm. Denying active SI. Passive SI with a verbal contract to talk to staff if she feels overwhelmed. At 4 pm patient in her room getting an EKG with this Probation officer.

## 2016-07-29 NOTE — Progress Notes (Signed)
Patient ID: [name] [name], female   DOB: 06-03-1971, 45 y.o.   MRN: [id number] PER STATE REGULATIONS 482.30  THIS CHART WAS REVIEWED FOR MEDICAL NECESSITY WITH RESPECT TO THE PATIENT'S ADMISSION/DURATION OF STAY.  NEXT REVIEW DATE:08/02/16  Roma Schanz, RN, BSN CASE MANAGER

## 2016-07-29 NOTE — Progress Notes (Signed)
Charge RN communicated with assigned Retail banker that pt preference is a female  Actuary. [facility]., aware as well as scheduled staff.

## 2016-07-29 NOTE — [facility] Suicide Risk Assessment (Signed)
[facility] Admission Suicide Risk Assessment   Nursing information obtained from:    Demographic factors:    Current Mental Status:    Loss Factors:    Historical Factors:    Risk Reduction Factors:     Total Time spent with patient: 30 minutes Principal Problem: Schizoaffective disorder, bipolar type ([place]) Diagnosis:   Patient Active Problem List   Diagnosis Date Noted  . PTSD (post-traumatic stress disorder) [F43.10] 09/10/2015    Priority: High  . Hypertension [I10] 03/30/2016  . Serotonin syndrome [G25.79] 03/30/2016  . Elevated blood pressure [R03.0] 03/30/2016  . Leukocytosis [D72.829] 03/30/2016  . Precordial chest pain [R07.2] 03/30/2016  . Incisional hernia [K43.2] 12/24/2015  . Drug overdose, intentional ([place]) [T50.902A]   . Hyperprolactinemia ([place]) [E22.1] 09/11/2015  . Schizoaffective disorder, bipolar type ([place]) [F25.0] 09/10/2015  . Hx of borderline personality disorder [Z86.59] 09/10/2015  . Suicide attempt ([place]) [T14.91] 09/05/2015  . Overdose, drug [T50.901A] 09/05/2015  . Overdose [T50.901A] 09/05/2015  . Drug overdose [T50.901A]   . QT prolongation [I45.81]   . Weight gain [R63.5] 03/06/2015  . Flushing [R23.2] 03/06/2015  . Iron deficiency anemia, unspecified [D50.9] 11/15/2013  . Liver mass, left lobe [R16.0] 05/28/2013  . Postop check [Z09] 03/13/2013  . Postoperative wound infection [T81.4XXA] 02/23/2013  . History of colostomy [Z98.890] 02/16/2013  . Acute blood loss anemia [D62] 08/01/2012  . Renal calculus, left [N20.0] 03/31/2012  . Hydronephrosis, left [N13.30] 03/31/2012   Subjective Data: Please see H&P.   Continued Clinical Symptoms:  Alcohol Use Disorder Identification Test Final Score (AUDIT): 0 The "Alcohol Use Disorders Identification Test", Guidelines for Use in Primary Care, Second Edition.  World Pharmacologist [facility]). Score between 0-7:  no or low risk or alcohol related problems. Score between 8-15:  moderate risk of alcohol  related problems. Score between 16-19:  high risk of alcohol related problems. Score 20 or above:  warrants further diagnostic evaluation for alcohol dependence and treatment.   CLINICAL FACTORS:   Alcohol/Substance Abuse/Dependencies Previous Psychiatric Diagnoses and Treatments   Musculoskeletal: Strength & Muscle Tone: within normal limits Gait & Station: normal Patient leans: N/A  Psychiatric Specialty Exam: Physical Exam  ROS  Blood pressure 101/77, pulse 95, temperature 98.7 F (37.1 C), temperature source Oral, resp. rate 18, height 5\' 4"  (1.626 m), weight 97.5 kg (215 lb), last menstrual period 06/30/2016, SpO2 99 %.Body mass index is 36.9 kg/m.              Please see H&P.                                             COGNITIVE FEATURES THAT CONTRIBUTE TO RISK:  Closed-mindedness, Polarized thinking and Thought constriction (tunnel vision)    SUICIDE RISK:   Severe:  Frequent, intense, and enduring suicidal ideation, specific plan, no subjective intent, but some objective markers of intent (i.e., choice of lethal method), the method is accessible, some limited preparatory behavior, evidence of impaired self-control, severe dysphoria/symptomatology, multiple risk factors present, and few if any protective factors, particularly a lack of social support.   PLAN OF CARE: Please see H&P.   I certify that inpatient services furnished can reasonably be expected to improve the patient's condition.  Anesha Hackert, MD 07/29/2016, 2:38 PM

## 2016-07-29 NOTE — Progress Notes (Signed)
1:1 note  Pt presents with preoccupied, depressed mood and affect.  Pt refuses to answer some questions asked by Probation officer.  Pt denies SI/HI.  She refuses to disclose whether she is having hallucinations or not.  Pt refuses to answer whether she has pain or not.  Pt has been visible in the day room with no peer interaction.    Introduced self to pt.  Encouraged pt to inform staff of needs and concerns.  Pt reports decreased anxiety since taking Vistaril PRN earlier this evening.    Pt is safe on the unit.  She verbally contracts for safety.  1:1 staff remains with pt for safety per order.  Will continue to monitor and assess.

## 2016-07-30 LAB — LIPID PANEL
Cholesterol: 141 mg/dL (ref 0–200)
HDL: 40 mg/dL — ABNORMAL LOW (ref >40)
LDL CALC: 76 mg/dL (ref 0–99)
TRIGLYCERIDES: 126 mg/dL (ref <150)
Total CHOL/HDL Ratio: 3.5 RATIO
VLDL: 25 mg/dL (ref 0–40)

## 2016-07-30 LAB — TSH: TSH: 1.104 u[IU]/mL (ref 0.350–4.500)

## 2016-07-30 MED ORDER — CARBAMAZEPINE 100 MG PO CHEW
100.0000 mg | CHEWABLE_TABLET | Freq: Three times a day (TID) | ORAL | Status: DC
  Administered 2016-07-30 – 2016-08-03 (×12): 100 mg via ORAL
  Filled 2016-07-30 (×17): qty 1

## 2016-07-30 NOTE — Progress Notes (Signed)
RN 1:1 NOTE  D: Patient denies SI/HI and A/V hallucinations; patient heart rate elevated and Dr. Shea Evans made aware  A: Monitored q 15 minutes; patient encouraged to attend groups; patient educated about medications; patient given medications per physician orders; patient encouraged to express feelings and/or concerns  R: Patient forwards little information; patient is guarded; patient is flat and depressed; patient is cooperative; patient interaction is minimal with peers and staff; patient attended morning group

## 2016-07-30 NOTE — [facility] Group Notes (Signed)
[facility] LCSW Group Therapy  07/30/2016  1:05 PM  Type of Therapy:  Group therapy  Participation Level:  Active  Participation Quality:  Attentive  Affect:  Flat  Cognitive:  Oriented  Insight:  Limited  Engagement in Therapy:  Limited  Modes of Intervention:  Discussion, Socialization  Summary of Progress/Problems:  Chaplain was here to lead a group on themes of hope and courage.  In order for me to feel that someone cares, they need to sit down with me and spend time time with me so that I can feel a connection and feel trust.  Identified her daughter as someone she experiences this with-higher power as well.  [name] 07/30/2016 1:28 PM

## 2016-07-30 NOTE — Progress Notes (Signed)
[facility] MD Progress Note  07/30/2016 11:39 AM [name]  MRN:  [id number] Subjective: Patient states " I am ok.'  Objective:Anastasija KELANIE PENDELTON is a 45 y.o. Caucasian female, divorced , lives by self in [place] ( [place])  , has a past medical history of Schizoaffective disorder , several suicide attempts who presented to [facility] from [facility] hospital after a recent suicide attempt by OD on pills and continued suicidal gestures by wrapping a cord around her neck while at the ED.  Patient seen and chart reviewed.Discussed patient with treatment team.  Patient seen as depressed, withdrawn , is very guarded , reports medications as effective. She does have a headache today - discussed several measures as well as symptomatic treatment options. Per nursing - pt appears withdrawn , guarded - does not respond to questions asked. Hence given her several significant past suicide attempts and recent suicidal gestures while at another facility - will continue 1:1 precaution for safety.    Principal Problem: Schizoaffective disorder, bipolar type ([place]) Diagnosis:   Patient Active Problem List   Diagnosis Date Noted  . PTSD (post-traumatic stress disorder) [F43.10] 09/10/2015    Priority: High  . Hypertension [I10] 03/30/2016  . Serotonin syndrome [G25.79] 03/30/2016  . Elevated blood pressure [R03.0] 03/30/2016  . Leukocytosis [D72.829] 03/30/2016  . Precordial chest pain [R07.2] 03/30/2016  . Incisional hernia [K43.2] 12/24/2015  . Drug overdose, intentional ([place]) [T50.902A]   . Hyperprolactinemia ([place]) [E22.1] 09/11/2015  . Schizoaffective disorder, bipolar type ([place]) [F25.0] 09/10/2015  . Hx of borderline personality disorder [Z86.59] 09/10/2015  . Suicide attempt ([place]) [T14.91] 09/05/2015  . Overdose, drug [T50.901A] 09/05/2015  . Overdose [T50.901A] 09/05/2015  . Drug overdose [T50.901A]   . QT prolongation [I45.81]   . Weight gain [R63.5] 03/06/2015  . [place] [R23.2] 03/06/2015  . Iron  deficiency anemia, unspecified [D50.9] 11/15/2013  . Liver mass, left lobe [R16.0] 05/28/2013  . Postop check [Z09] 03/13/2013  . Postoperative wound infection [T81.4XXA] 02/23/2013  . History of colostomy [Z98.890] 02/16/2013  . Acute blood loss anemia [D62] 08/01/2012  . Renal calculus, left [N20.0] 03/31/2012  . Hydronephrosis, left [N13.30] 03/31/2012   Total Time spent with patient: 30 minutes  Past Psychiatric History: Please see H&P.   Past Medical History:  Past Medical History:  Diagnosis Date  . Anxiety   . Blood dyscrasia    pt not aware  . Depression   . Diarrhea, functional   . Diverticulitis   . Headache    migraines  . Heart murmur    asa child   . Hx pulmonary embolism 2013   after surgery from [place]  . Hyperlipidemia   . Hypertension    not on medication  . Iron deficiency anemia, unspecified 11/15/2013  . Kidney stone   . Paranoid schizophrenia ([place])   . Personality disorder   . Renal insufficiency     Past Surgical History:  Procedure Laterality Date  . Breast Reduction Left 06/2014  . COLOSTOMY  07/31/2012   Procedure: COLOSTOMY;  Surgeon: Harl Bowie, MD;  Location: [place];  Service: General;  Laterality: Right;  . COLOSTOMY CLOSURE N/A 02/15/2013   Procedure: COLOSTOMY CLOSURE;  Surgeon: Gwenyth Ober, MD;  Location: [place];  Service: General;  Laterality: N/A;  Reversal of colostomy  . COLOSTOMY REVERSAL    . DILATATION & CURETTAGE/HYSTEROSCOPY WITH TRUECLEAR N/A 10/24/2013   Procedure: DILATATION & CURETTAGE/HYSTEROSCOPY WITH TRUECLEAR, CERVICAL BLOCK;  Surgeon: Marylynn Pearson, MD;  Location: [facility];  Service:  Gynecology;  Laterality: N/A;  . [name]  2013   ABDOMINAL SURGERY  . INCISIONAL HERNIA REPAIR N/A 12/24/2015   Procedure:  INCISIONAL HERNIA REPAIR WITH MESH;  Surgeon: Coralie Keens, MD;  Location: [place];  Service: General;  Laterality: N/A;  . INSERTION OF MESH N/A 12/24/2015   Procedure: INSERTION OF MESH;  Surgeon: Coralie Keens, MD;  Location: [facility];  Service: General;  Laterality: N/A;  . LAPAROTOMY  07/31/2012   Procedure: EXPLORATORY LAPAROTOMY;  Surgeon: Harl Bowie, MD;  Location: [place];  Service: General;  Laterality: N/A;  REPAIR OF PANCREATIC INJURY, EXPLORATION OF RETROPERITONEUM.  [name] NEPHROLITHOTOMY  03/31/2012   Procedure: NEPHROLITHOTOMY PERCUTANEOUS;  Surgeon: Claybon Jabs, MD;  Location: [facility];  Service: Urology;  Laterality: Left;  . OTHER SURGICAL HISTORY     cyst removed from ovary ? side   . TUBAL LIGATION     Family History:  Family History  Problem Relation Age of Onset  . Cancer Mother     kidney  . Depression Mother   . Other Neg Hx     adrenal problem   Family Psychiatric  History: Please see H&P.  Social History:  History  Alcohol Use No     History  Drug Use No    Social History   Social History  . Marital status: Single    Spouse name: N/A  . Number of children: 2  . Years of education: N/A   Occupational History  . Disabled    Social History Main Topics  . Smoking status: Never Smoker  . Smokeless tobacco: Never Used  . Alcohol use No  . Drug use: No  . Sexual activity: Yes    Birth control/ protection: Surgical   Other Topics Concern  . None   Social History Narrative   ** Merged History Encounter **       Additional Social History:    Pain Medications: See MAR. Prescriptions: See MAR.  Over the Counter: See MAR. History of alcohol / drug use?: No history of alcohol / drug abuse                    Sleep: Fair  Appetite:  Fair  Current Medications: Current Facility-Administered Medications  Medication Dose Route Frequency Provider Last Rate Last Dose  . acetaminophen (TYLENOL) tablet 650 mg  650 mg Oral Q6H PRN Ursula Alert, MD      . alum & mag hydroxide-simeth (MAALOX/MYLANTA) 200-200-20 MG/5ML suspension 30 mL  30 mL Oral Q4H PRN Elveria Lauderbaugh, MD      . amLODipine (NORVASC) tablet 5 mg  5 mg Oral Daily Ursula Alert, MD   5 mg at 07/30/16 0856  . carbamazepine (TEGRETOL) tablet 100 mg  100 mg Oral TID Ursula Alert, MD   100 mg at 07/30/16 0857  . hydrOXYzine (ATARAX/VISTARIL) tablet 25 mg  25 mg Oral TID PRN Ursula Alert, MD   25 mg at 07/29/16 1856  . hydrOXYzine (ATARAX/VISTARIL) tablet 50 mg  50 mg Oral QHS Ursula Alert, MD   50 mg at 07/29/16 2104  . LORazepam (ATIVAN) tablet 1 mg  1 mg Oral Q6H PRN Ursula Alert, MD   1 mg at 07/29/16 2104   Or  . LORazepam (ATIVAN) injection 1 mg  1 mg Intramuscular Q6H PRN Seleen Walter, MD      . magnesium hydroxide (MILK OF MAGNESIA) suspension 30 mL  30 mL Oral Daily PRN Ursula Alert, MD      .  simvastatin (ZOCOR) tablet 20 mg  20 mg Oral q1800 Ursula Alert, MD   20 mg at 07/29/16 1707  . traZODone (DESYREL) tablet 50 mg  50 mg Oral QHS Ursula Alert, MD   50 mg at 07/29/16 2104    Lab Results:  Results for orders placed or performed during the hospital encounter of 07/29/16 (from the past 48 hour(s))  TSH     Status: None   Collection Time: 07/30/16  6:42 AM  Result Value Ref Range   TSH 1.104 0.350 - 4.500 uIU/mL    Comment: Performed at [facility]  Lipid panel     Status: Abnormal   Collection Time: 07/30/16  6:42 AM  Result Value Ref Range   Cholesterol 141 0 - 200 mg/dL   Triglycerides 126 <150 mg/dL   HDL 40 (L) >40 mg/dL   Total CHOL/HDL Ratio 3.5 RATIO   VLDL 25 0 - 40 mg/dL   LDL Cholesterol 76 0 - 99 mg/dL    Comment:        Total Cholesterol/HDL:CHD Risk Coronary Heart Disease Risk Table                     Men   Women  1/2 Average Risk   3.4   3.3  Average Risk       5.0   4.4  2 X Average Risk   9.6   7.1  3 X Average Risk  23.4   11.0        Use the calculated Patient Ratio above and the CHD Risk Table to determine the patient's CHD Risk.        ATP III CLASSIFICATION (LDL):  <100     mg/dL   Optimal  100-129  mg/dL   Near or Above                    Optimal  130-159  mg/dL    Borderline  160-189  mg/dL   High  >190     mg/dL   Very High Performed at [facility]     Blood Alcohol level:  Lab Results  Component Value Date   [facility] <5 02/02/2016   ETH <5 123XX123    Metabolic Disorder Labs: Lab Results  Component Value Date   HGBA1C 6.2 (H) 03/30/2016   MPG 131 03/30/2016   Lab Results  Component Value Date   PROLACTIN 108.5 (H) 09/10/2015   Lab Results  Component Value Date   CHOL 141 07/30/2016   TRIG 126 07/30/2016   HDL 40 (L) 07/30/2016   CHOLHDL 3.5 07/30/2016   VLDL 25 07/30/2016   LDLCALC 76 07/30/2016    Physical Findings: AIMS:  , ,  ,  ,    CIWA:    COWS:     Musculoskeletal: Strength & Muscle Tone: within normal limits Gait & Station: normal Patient leans: N/A  Psychiatric Specialty Exam: Physical Exam  Nursing note and vitals reviewed. Constitutional:  I concur with PE done in ED    Review of Systems  Psychiatric/Behavioral: Positive for depression. The patient is nervous/anxious.   All other systems reviewed and are negative.   Blood pressure 120/70, pulse (!) 126, temperature 98.9 F (37.2 C), temperature source Oral, resp. rate 18, height 5\' 4"  (1.626 m), weight 97.5 kg (215 lb), last menstrual period 06/30/2016, SpO2 99 %.Body mass index is 36.9 kg/m.  General Appearance: Guarded  Eye Contact:  Fair  Speech:  Slow  Volume:  Decreased  Mood:  Anxious, Depressed and Dysphoric  Affect:  Restricted  Thought Process:  Goal Directed and Descriptions of Associations: Intact  Orientation:  Full (Time, Place, and Person)  Thought Content:  Rumination  Suicidal Thoughts:  denies at this time - however is S/P suicide attempt and had continued suicidal gestures while at the ED  Homicidal Thoughts:  No  Memory:  Immediate;   Fair Recent;   Fair Remote;   Fair  Judgement:  Impaired  Insight:  Shallow  Psychomotor Activity:  Decreased  Concentration:  Concentration: Fair and Attention Span: Fair  Recall:   [employer] of Knowledge:  Fair  Language:  Fair  Akathisia:  No  Handed:  Right  AIMS (if indicated):     Assets:  Desire for Improvement Housing  ADL's:  Intact  Cognition:  WNL  Sleep:  Number of Hours: 6.75     Treatment Plan Summary:Nataleah LYNSEE PRIBBLE is a 45 y.o. Caucasian female, divorced , lives by self in [place] ( [place])  , has a past medical history of Schizoaffective disorder , several suicide attempts who presented to [facility] from [facility] hospital after a recent suicide attempt by OD on pills and continued suicidal gestures by wrapping a cord around her neck while at the ED.  Patient known to [facility] unit - has had several significant suicide attempts in the past - OD on pills , shot self in her abdomen, OD on pills after calling ACTT and checking herself in to a remote motel ( was hard to find patient at that time ) . Given her high risk and recent attempt and continued gesture after being at the ED , will continue 1:1 precaution for safety. Will continue treatment. Daily contact with patient to assess and evaluate symptoms and progress in treatment and Medication management    Pt to be restarted on Invega Trinza 546 mg IM - q 3 months - last dose on July 3 rd 2017 ( verified per ACTT)  Will continue Tegretol 100 mg po tid for mood sx. Tegretol level on 08/01/16.. Will continue 1:1 precaution for safety / pt with multiple suicide attempts as well as recent suicidal gestures while in ED( see above for details) . Will continue to monitor vitals ,medication compliance and treatment side effects while patient is here.  Will monitor for medical issues as well as call consult as needed.  Reviewed labs done in ED ,UDS - pos for stimulants , opioids , repeat EKG for hx of prolonged qtc- wnl , tsh - wnl  lipid panel- wnl , pending hba1c. CSW will continue  working on disposition.Obtained collateral information from ACTT. Patient to participate in therapeutic milieu  Frankee Gritz,  MD 07/30/2016, 11:39 AM

## 2016-07-30 NOTE — Plan of Care (Signed)
Problem: Medication: Goal: Compliance with prescribed medication regimen will improve Outcome: Progressing Pt is safe and compliant with medication regime

## 2016-07-30 NOTE — Tx Team (Signed)
Interdisciplinary Treatment Plan Update (Adult)  Date:  07/30/2016   Time Reviewed:  12:30 PM   Progress in Treatment: Attending groups: Yes. Participating in groups:  Yes. Taking medication as prescribed:  Yes. Tolerating medication:  Yes. Family/Significant other contact made:  No Patient understands diagnosis:  Yes  As evidenced by seeking help with depression Discussing patient identified problems/goals with staff:  Yes, see initial care plan. Medical problems stabilized or resolved:  Yes. Denies suicidal/homicidal ideation: Yes. Issues/concerns per patient self-inventory:  No. Other:  New problem(s) identified:  Discharge Plan or Barriers: see below  Reason for Continuation of Hospitalization: Depression Medication stabilization Suicidal ideation  Comments:   Pt is currently on 1:1 precaution due to her being depressed and is S/P two recent suicide attempts. Pt reports that she recently got divorced from her husband and ever since then has been going through a lot of other changes . Pt reports that she has to do a lot of things all by herself and that has been very scary. Pt also reports that her ACTT is splitting up and now she is going to get a new ACTT and that has been very stressful.  Pt to be restarted on Invega Trinza 546 mg IM - q 3 months - last dose in July as per pt. Will start Tegretol 100 mg po tid for mood sx. Tegretol level in 3 days.  Estimated length of stay: 4-5 days  New goal(s):  Review of initial/current patient goals per problem list:   Review of initial/current patient goals per problem list:  1. Goal(s): Patient will participate in aftercare plan   Met: Yes   Target date: 3-5 days post admission date   As evidenced by: Patient will participate within aftercare plan AEB aftercare provider and housing plan at discharge being identified. 07/30/16:  Return home, follow up ACT team.   2. Goal (s): Patient will exhibit decreased depressive  symptoms and suicidal ideations.   Met: No   Target date: 3-5 days post admission date   As evidenced by: Patient will utilize self rating of depression at 3 or below and demonstrate decreased signs of depression or be deemed stable for discharge by MD. 07/30/16:  Suicide attempt prior to hospitalization and again in ED.  Rates depression a 10 today.      5. Goal(s): Patient will demonstrate decreased signs of psychosis  * Met: Yes  * Target date: 3-5 days post admission date  * As evidenced by: Patient will demonstrate decreased frequency of AVH or return to baseline function 07/30/16:  Denies psychosis today.  Presents with no signs nor symptoms          Attendees: Patient:  07/30/2016 12:30 PM   Family:   07/30/2016 12:30 PM   Physician:  Ursula Alert, MD 07/30/2016 12:30 PM   Nursing:   Marilynne Halsted, RN 07/30/2016 12:30 PM   CSW:    Roque Lias, LCSW   07/30/2016 12:30 PM   Other:  07/30/2016 12:30 PM   Other:   07/30/2016 12:30 PM   Other:  Lars Pinks, Nurse CM 07/30/2016 12:30 PM   Other:   07/30/2016 12:30 PM   Other:  Norberto Sorenson, [place]  07/30/2016 12:30 PM   Other:  07/30/2016 12:30 PM   Other:  07/30/2016 12:30 PM   Other:  07/30/2016 12:30 PM   Other:  07/30/2016 12:30 PM   Other:  07/30/2016 12:30 PM   Other:   07/30/2016 12:30 PM    Scribe  for Treatment Team:   Trish Mage, 07/30/2016 12:30 PM

## 2016-07-30 NOTE — Plan of Care (Signed)
Problem: Medication: Goal: Compliance with prescribed medication regimen will improve Outcome: Progressing Pt has been compliant with medications during this shift.

## 2016-07-30 NOTE — Progress Notes (Signed)
Patient ID: [name] [name], female   DOB: [birth year], 45 y.o.   MRN: [id number] 1:1 notes  07/30/2016 @ 2200  D: Patient sitting in dayroom with peers watching TV. Pt mood and affect appeared depressed and flat. Pt rated depression 3 and anxiety 0. Pt reports ready to be off 1:1 observation. Pt denies SI/HI/AVH and pain. Pt participated in evening wrap up group and engage in discussions. No sign of physical distress noted. A: 1:1 observation for safety. Medication administered as prescribed. Pt encouraged to discuss feeling and come to staff with any needs/complaints. R: Patient is safe.Kennon Holter at pt's side. 1:1 continues

## 2016-07-30 NOTE — Progress Notes (Signed)
[facility] [facility] Group Notes:  (Nursing/MHT/Case Management/Adjunct)  Date:  07/30/2016  Time:  9:12 PM  Type of Therapy:  Psychoeducational Skills  Participation Level:  Active  Participation Quality:  Attentive  Affect:  Flat  Cognitive:  Appropriate  Insight:  Good  Engagement in Group:  Developing/Improving  Modes of Intervention:  Education  Summary of Progress/Problems: The patient expressed in group this evening that she had a good day and that she enjoyed the groups. As for the theme of the day, her relapse prevention will include working on her trusting God more and depending on her family more often for help.   [name] 07/30/2016, 9:12 PM

## 2016-07-30 NOTE — Progress Notes (Signed)
1:1 note  Pt is resting in bed with eyes closed.  Respirations are even and unlabored.  Pt is in no apparent distress.  1:1 staff remains with pt for safety.  Will continue to monitor and assess.

## 2016-07-30 NOTE — Progress Notes (Signed)
RN 1:1 NOTE   D: Patient denies SI/HI and A/V hallucinations  A: Continue 1:1 observation   R: Patient is pleasant and cooperative; patient is sad and depressed; patient has brighten and is talking more; patient contracts for safety
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
## 2016-07-30 NOTE — Progress Notes (Signed)
D: Patient denies SI/HI and A/V hallucinations; patient asked about being off the 1:1; patient also become anxious and upset after seeing another patient become aggressive  A: Monitored q 15 minutes; patient encouraged to attend groups; patient educated about medications; patient given medications per physician orders; patient encouraged to express feelings and/or concerns  R: Patient had relief after the episode; patient is brighter and engaging more with staff and peers; patient had no other acute needs at this time

## 2016-07-31 LAB — HEMOGLOBIN A1C
Hgb A1c MFr Bld: 6 % — ABNORMAL HIGH (ref 4.8–5.6)
Mean Plasma Glucose: 126 mg/dL

## 2016-07-31 MED ORDER — SENNOSIDES-DOCUSATE SODIUM 8.6-50 MG PO TABS
1.0000 | ORAL_TABLET | Freq: Every morning | ORAL | Status: DC
  Administered 2016-07-31 – 2016-08-01 (×2): 1 via ORAL
  Filled 2016-07-31 (×7): qty 1

## 2016-07-31 NOTE — Progress Notes (Signed)
[place] Group Notes:  (Nursing/MHT/Case Management/Adjunct)  Date:  07/31/2016  Time:  9:42 PM  Type of Therapy:  Psychoeducational Skills  Participation Level:  Active  Participation Quality:  Appropriate  Affect:  Appropriate  Cognitive:  Appropriate  Insight:  Good   Engagement in Group:  Improving  Modes of Intervention:  Education  Summary of Progress/Problems: The patient was more talkative in group this evening as compared to last night. She stated that she had a good family visit and that she remained out of bed for the entire day. In terms of the theme for the day, her coping skill will be to color which she worked on with her 1:1 staff.   [name] 07/31/2016, 9:42 PM

## 2016-07-31 NOTE — BHH Group Notes (Signed)
[place] Group Notes:  (Nursing/MHT/Case Management/Adjunct)  Date:  07/31/2016  Time:  1030am  Type of Therapy:  Nurse Education  Participation Level:  Active  Participation Quality:  Appropriate, Attentive and Sharing  Affect:  Depressed  Cognitive:  Alert, Appropriate and Oriented  Insight:  Improving  Engagement in Group:  Engaged and Supportive  Modes of Intervention:  Discussion and Education  Summary of Progress/Problems:  Today's topic was Healthy Coping skills.  Discussed goal setting and sleep hygiene.  Criselda reported that her goal for today is "to accept my sister's offer to help with budgeting/finances."  She was able to state her healthy coping skills is using an app on her phone called "Stop, Breath and think.  My therapist gave me this and it has really helped."    Windell Moment 07/31/2016, 11:36 AM
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
## 2016-07-31 NOTE — Progress Notes (Signed)
[name] [name] called the [facility] and cancelled her appointment for tomorrow evening.  She will call when she is discharged to get another appointment time.

## 2016-07-31 NOTE — BHH Group Notes (Signed)
[facility] Group Notes:  (Clinical Social Work)  07/31/2016  11:15-12:00PM  Summary of Progress/Problems:   Today's process group involved patients discussing their feelings related to being hospitalized, as well as how they can use their present feelings to create a goal for the day. The patient expressed her primary feeling about being hospitalized is "I feel it helps heal hurting, and it has let me escape from my problems and think about how to handle them."  She gave good feedback to others and was very appropriate in group.  Type of Therapy:  Group Therapy - Process  Participation Level:  Active  Participation Quality:  Attentive, Sharing and Supportive  Affect:  Blunted  Cognitive:  Appropriate and Oriented  Insight:  Engaged  Engagement in Therapy:  Engaged  Modes of Intervention:  Exploration, Discussion  Selmer Dominion, LCSW 07/31/2016, 4:40 PM
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
## 2016-07-31 NOTE — Progress Notes (Signed)
1:1 DAR Note [name] [name] remains visible on the unit with sitter by side.  She has been talkative this afternoon.  She is talking about her dog, her daughter and her apartment that she is currently living in.  "I like living alone, I can wash the cabinets at 3 am if I want to."  She denies any SI/HI or A/V hallucinations.  She is eating well.  No c/o of pain.  She informed Probation officer that she has been having a problem with constipation but doesn't want to take milk of magnesia because it causes her to have diarrhea.  Staffed with NP and new order for stool softener obtained.  1:1 remains for safety.  We will continue to monitor the progress towards her goals.

## 2016-07-31 NOTE — Progress Notes (Signed)
Patient ID: [name] [name], female   DOB: [birth year], 45 y.o.   MRN: [id number] 1:1 notes  07/31/2016 @ 0200  D: Patient in bed sleeping. Respiration regular and unlabored. No sign of distress noted at this time A: 1:1 observation for safety R: Patient is asleep. Sitter at bedside. 1:1 continues

## 2016-07-31 NOTE — Progress Notes (Signed)
Patient ID: [name] [name], female   DOB: [birth year], 45 y.o.   MRN: [id number] Reports given to [name], RN. Pt is safe with sitter.

## 2016-07-31 NOTE — Progress Notes (Signed)
1:1 Nursing note- Late entry  Writer spoke with patient 1:1 after her visitor left. She reports that her day has been good and she had a good visit with her sister who is supportive of her and will be helping her out once discharged. She was informed of her hs meds and was compliant with them. She requested an ativan with her medications reporting that it helps her to sleep at night. She denies si/hi/a/v hallucinations. Support given and safety maintained on unit with 15 min checks. 1:1 continues and patient is safe.

## 2016-07-31 NOTE — Progress Notes (Signed)
Patient ID: [name] [name], female   DOB: May 04, 1971, 45 y.o.   MRN: [id number] 1:1 notes  07/31/2016 @ 0600  D: Patient in bed sleeping. Respiration regular and unlabored. No sign of distress noted at this time A: 1:1 observation for safety R: Patient remains asleep. Sitter at bedside. 1:1 continues

## 2016-07-31 NOTE — Progress Notes (Signed)
1:1 DAR Note: [name] [name] has been in the day room much of the morning with 1:1 sitter by her side.  She denies SI/HI or A/V hallucinations.  Conversation appropriate and goal directed.  She stated "I am feeling much better, I want to be off 1:1 so I can go to the cafeteria."  She denies any pain or discomfort and appears to be in no physical distress.  1:1 continues for safety.  We will continue to monitor the progress towards her goals.

## 2016-08-01 ENCOUNTER — Ambulatory Visit (HOSPITAL_BASED_OUTPATIENT_CLINIC_OR_DEPARTMENT_OTHER): Payer: Medicare Other | Attending: Family Medicine

## 2016-08-01 LAB — CARBAMAZEPINE LEVEL, TOTAL: CARBAMAZEPINE LVL: 5.3 ug/mL (ref 4.0–12.0)

## 2016-08-01 MED ORDER — MAGNESIUM CITRATE PO SOLN
1.0000 | Freq: Once | ORAL | Status: AC
  Administered 2016-08-01: 1 via ORAL

## 2016-08-01 NOTE — Progress Notes (Signed)
Patient lying in bed asleep with eyes closed and respirations even and unlabored, no distress noted. 1:1 continues and patient is safe.  

## 2016-08-01 NOTE — [facility] Group Notes (Signed)
[facility] Group Notes:  (Clinical Social Work)  08/01/2016  11:00AM-12:00PM  Summary of Progress/Problems:  The main focus of today's process group was to listen to a variety of genres of music and to identify that different types of music provoke different responses.  The patient then was able to identify personally what was soothing for them, as well as energizing, as well as how patient can personally use this knowledge in sleep habits, with depression, and with other symptoms.  The patient expressed at the beginning of group the overall feeling of "inspired" but did not react to any of the music or talk about her feelings at all throughout group, was busy coloring.  Type of Therapy:  Music Therapy   Participation Level:  Active  Participation Quality:  Attentive   Affect:  Flat  Cognitive:  Oriented  Insight: Improving  Engagement in Therapy:  Improving  Modes of Intervention:   Activity, Exploration  Selmer Dominion, LCSW 08/01/2016

## 2016-08-01 NOTE — Progress Notes (Signed)
Patient currently sitting in the dayroom watching the news.She is dressed and ready for the day. She reports that she rested well last night. 1:1 continues and patient is safe with sitter at her side.

## 2016-08-01 NOTE — Progress Notes (Signed)
[facility] MD Progress Note  08/01/2016 1:26 PM [name] [name]  MRN:  [id number] Subjective: Patient states " I am feeling okay.  I am not having thoughts to hurt myself and I am able to contract for safety."  Patient reports she is hopeful to be discharged by Wednesday states she has a ECT appt with MD. Gregor Hams. [facility])  Objective: [name] is awake, alert and oriented X4, seen sitting in the dayroom 1:1 for safety. Denies suicidal or homicidal ideation. Denies auditory or visual hallucination and does not appear to be responding to internal stimuli. Patient reports she was going through a rough patch' and now feels she is able to handle current stressors. Patient reports she had a good family visit.states she is eager to see her daughter on tonight. Reports good appetite other wise and resting well. Support, encouragement and reassurance was provided.    Principal Problem: Schizoaffective disorder, bipolar type ([place]) Diagnosis:   Patient Active Problem List   Diagnosis Date Noted  . Hypertension [I10] 03/30/2016  . Serotonin syndrome [G25.79] 03/30/2016  . Elevated blood pressure [R03.0] 03/30/2016  . Leukocytosis [D72.829] 03/30/2016  . Precordial chest pain [R07.2] 03/30/2016  . Incisional hernia [K43.2] 12/24/2015  . Drug overdose, intentional ([place]) [T50.902A]   . Hyperprolactinemia ([place]) [E22.1] 09/11/2015  . Schizoaffective disorder, bipolar type ([place]) [F25.0] 09/10/2015  . Hx of borderline personality disorder [Z86.59] 09/10/2015  . PTSD (post-traumatic stress disorder) [F43.10] 09/10/2015  . Suicide attempt ([place]) [T14.91] 09/05/2015  . Overdose, drug [T50.901A] 09/05/2015  . Overdose [T50.901A] 09/05/2015  . Drug overdose [T50.901A]   . QT prolongation [I45.81]   . Weight gain [R63.5] 03/06/2015  . [place] [R23.2] 03/06/2015  . Iron deficiency anemia, unspecified [D50.9] 11/15/2013  . Liver mass, left lobe [R16.0] 05/28/2013  . Postop check [Z09]  03/13/2013  . Postoperative wound infection [T81.4XXA] 02/23/2013  . History of colostomy [Z98.890] 02/16/2013  . Acute blood loss anemia [D62] 08/01/2012  . Renal calculus, left [N20.0] 03/31/2012  . Hydronephrosis, left [N13.30] 03/31/2012   Total Time spent with patient: 30 minutes  Past Psychiatric History: Please see H&P.   Past Medical History:  Past Medical History:  Diagnosis Date  . Anxiety   . Blood dyscrasia    pt not aware  . Depression   . Diarrhea, functional   . Diverticulitis   . Headache    migraines  . Heart murmur    asa child   . Hx pulmonary embolism 2013   after surgery from [place]  . Hyperlipidemia   . Hypertension    not on medication  . Iron deficiency anemia, unspecified 11/15/2013  . Kidney stone   . Paranoid schizophrenia ([place])   . Personality disorder   . Renal insufficiency     Past Surgical History:  Procedure Laterality Date  . Breast Reduction Left 06/2014  . COLOSTOMY  07/31/2012   Procedure: COLOSTOMY;  Surgeon: Harl Bowie, MD;  Location: [facility];  Service: General;  Laterality: Right;  . COLOSTOMY CLOSURE N/A 02/15/2013   Procedure: COLOSTOMY CLOSURE;  Surgeon: Gwenyth Ober, MD;  Location: [facility];  Service: General;  Laterality: N/A;  Reversal of colostomy  . COLOSTOMY REVERSAL    . DILATATION & CURETTAGE/HYSTEROSCOPY WITH TRUECLEAR N/A 10/24/2013   Procedure: DILATATION & CURETTAGE/HYSTEROSCOPY WITH TRUECLEAR, CERVICAL BLOCK;  Surgeon: Marylynn Pearson, MD;  Location: [facility];  Service: Gynecology;  Laterality: N/A;  . [place]  2013   ABDOMINAL SURGERY  . [place]  N/A 12/24/2015   Procedure:  INCISIONAL HERNIA REPAIR WITH MESH;  Surgeon: Coralie Keens, MD;  Location: [facility];  Service: General;  Laterality: N/A;  . INSERTION OF MESH N/A 12/24/2015   Procedure: INSERTION OF MESH;  Surgeon: Coralie Keens, MD;  Location: [facility];  Service: General;  Laterality: N/A;  . LAPAROTOMY  07/31/2012   Procedure: EXPLORATORY  LAPAROTOMY;  Surgeon: Harl Bowie, MD;  Location: [place];  Service: General;  Laterality: N/A;  REPAIR OF PANCREATIC INJURY, EXPLORATION OF RETROPERITONEUM.  [name] NEPHROLITHOTOMY  03/31/2012   Procedure: NEPHROLITHOTOMY PERCUTANEOUS;  Surgeon: Claybon Jabs, MD;  Location: [facility];  Service: Urology;  Laterality: Left;  . OTHER SURGICAL HISTORY     cyst removed from ovary ? side   . TUBAL LIGATION     Family History:  Family History  Problem Relation Age of Onset  . Cancer Mother     kidney  . Depression Mother   . Other Neg Hx     adrenal problem   Family Psychiatric  History: Please see H&P.  Social History:  History  Alcohol Use No     History  Drug Use No    Social History   Social History  . Marital status: Single    Spouse name: N/A  . Number of children: 2  . Years of education: N/A   Occupational History  . Disabled    Social History Main Topics  . Smoking status: Never Smoker  . Smokeless tobacco: Never Used  . Alcohol use No  . Drug use: No  . Sexual activity: Yes    Birth control/ protection: Surgical   Other Topics Concern  . None   Social History Narrative   ** Merged History Encounter **       Additional Social History:    Pain Medications: See MAR. Prescriptions: See MAR.  Over the Counter: See MAR. History of alcohol / drug use?: No history of alcohol / drug abuse                    Sleep: Fair  Appetite:  Fair  Current Medications: Current Facility-Administered Medications  Medication Dose Route Frequency Provider Last Rate Last Dose  . acetaminophen (TYLENOL) tablet 650 mg  650 mg Oral Q6H PRN Ursula Alert, MD      . alum & mag hydroxide-simeth (MAALOX/MYLANTA) 200-200-20 MG/5ML suspension 30 mL  30 mL Oral Q4H PRN Saramma Eappen, MD      . amLODipine (NORVASC) tablet 5 mg  5 mg Oral Daily Ursula Alert, MD   5 mg at 08/01/16 0753  . carbamazepine (TEGRETOL) chewable tablet 100 mg  100 mg Oral TID Jenne Campus, MD    100 mg at 08/01/16 1300  . hydrOXYzine (ATARAX/VISTARIL) tablet 25 mg  25 mg Oral TID PRN Ursula Alert, MD   25 mg at 07/30/16 1540  . hydrOXYzine (ATARAX/VISTARIL) tablet 50 mg  50 mg Oral QHS Ursula Alert, MD   50 mg at 07/31/16 2132  . LORazepam (ATIVAN) tablet 1 mg  1 mg Oral Q6H PRN Ursula Alert, MD   1 mg at 07/31/16 2131   Or  . LORazepam (ATIVAN) injection 1 mg  1 mg Intramuscular Q6H PRN Saramma Eappen, MD      . magnesium hydroxide (MILK OF MAGNESIA) suspension 30 mL  30 mL Oral Daily PRN Saramma Eappen, MD      . senna-docusate (Senokot-S) tablet 1 tablet  1 tablet Oral q morning -  10a Derrill Center, NP   1 tablet at 08/01/16 1055  . simvastatin (ZOCOR) tablet 20 mg  20 mg Oral q1800 Ursula Alert, MD   20 mg at 07/31/16 1735  . traZODone (DESYREL) tablet 50 mg  50 mg Oral QHS Ursula Alert, MD   50 mg at 07/31/16 2131    Lab Results:  Results for orders placed or performed during the hospital encounter of 07/29/16 (from the past 48 hour(s))  Carbamazepine level, total     Status: None   Collection Time: 08/01/16  6:07 AM  Result Value Ref Range   Carbamazepine Lvl 5.3 4.0 - 12.0 ug/mL    Comment: Performed at [facility]    Blood Alcohol level:  Lab Results  Component Value Date   [facility] <5 02/02/2016   ETH <5 123XX123    Metabolic Disorder Labs: Lab Results  Component Value Date   HGBA1C 6.0 (H) 07/30/2016   MPG 126 07/30/2016   MPG 131 03/30/2016   Lab Results  Component Value Date   PROLACTIN 108.5 (H) 09/10/2015   Lab Results  Component Value Date   CHOL 141 07/30/2016   TRIG 126 07/30/2016   HDL 40 (L) 07/30/2016   CHOLHDL 3.5 07/30/2016   VLDL 25 07/30/2016   LDLCALC 76 07/30/2016    Physical Findings: AIMS: Facial and Oral Movements Muscles of Facial Expression: None, normal Lips and Perioral Area: None, normal Jaw: None, normal Tongue: None, normal,Extremity Movements Upper (arms, wrists, hands, fingers): None, normal Lower  (legs, knees, ankles, toes): None, normal, Trunk Movements Neck, shoulders, hips: None, normal, Overall Severity Severity of abnormal movements (highest score from questions above): None, normal Incapacitation due to abnormal movements: None, normal Patient's awareness of abnormal movements (rate only patient's report): No Awareness, Dental Status Current problems with teeth and/or dentures?: No Does patient usually wear dentures?: No  CIWA:    COWS:     Musculoskeletal: Strength & Muscle Tone: within normal limits Gait & Station: normal Patient leans: N/A  Psychiatric Specialty Exam: Physical Exam  Nursing note and vitals reviewed. Constitutional: She is oriented to person, place, and time. She appears well-developed.  I concur with PE done in ED  Eyes: Pupils are equal, round, and reactive to light.  Musculoskeletal: Normal range of motion.  Neurological: She is alert and oriented to person, place, and time.  Psychiatric: She has a normal mood and affect. Her behavior is normal.    Review of Systems  Psychiatric/Behavioral: Positive for depression. Negative for hallucinations and suicidal ideas. The patient is nervous/anxious.   All other systems reviewed and are negative.   Blood pressure 110/74, pulse 80, temperature 98.6 F (37 C), temperature source Oral, resp. rate 18, height 5\' 4"  (1.626 m), weight 97.5 kg (215 lb), last menstrual period 06/30/2016, SpO2 99 %.Body mass index is 36.9 kg/m.  General Appearance: Casual and Guarded  Eye Contact:  Fair  Speech:  Slow  Volume:  Decreased  Mood:  Anxious, Depressed and Dysphoric  Affect:  Restricted  Thought Process:  Goal Directed and Descriptions of Associations: Intact  Orientation:  Full (Time, Place, and Person)  Thought Content:  Rumination  Suicidal Thoughts:  No Patient denies at this time. Denies suicidal thoughts to self injurious behaviors. ( Staff notes: patient behaviors improving with a bright affect.)    Homicidal Thoughts:  No  Memory:  Immediate;   Fair Recent;   Fair Remote;   Fair  Judgement:  Impaired  Insight:  Shallow  Psychomotor Activity:  Normal- per staff notes patient is improving   Concentration:  Concentration: Fair and Attention Span: Fair  Recall:  [employer] of Knowledge:  Fair  Language:  Fair  Akathisia:  No  Handed:  Right  AIMS (if indicated):     Assets:  Desire for Improvement Housing  ADL's:  Intact  Cognition:  WNL  Sleep:  Number of Hours: 6.5    I agree with current treatment plan on 08/01/2016, Patient seen face-to-face for psychiatric evaluation follow-up, chart reviewed. Reviewed the information documented and agree with the treatment plan.  Treatment Plan Summary: Daily contact with patient to assess and evaluate symptoms and progress in treatment and Medication management  Discontinue 1:1 for safety Pt to be restarted on Invega Trinza 546 mg IM - q 3 months - last dose on July 3 rd 2017 ( verified per [facility])  Will continue Tegretol 100 mg po tid for mood sx. Tegretol level on 08/01/16.. Will continue 1:1 precaution for safety / pt with multiple suicide attempts as well as recent suicidal gestures while in ED( see above for details) . Will continue to monitor vitals ,medication compliance and treatment side effects while patient is here.  Will monitor for medical issues as well as call consult as needed.  Reviewed labs done in ED ,UDS - pos for stimulants , opioids , repeat EKG for hx of prolonged qtc- wnl , tsh - wnl  lipid panel- wnl , pending hba1c. CSW will continue  working on disposition.Obtained collateral information from [facility]. Patient to participate in therapeutic milieu   Patient known to [facility] unit - has had several significant suicide attempts in the past - OD on pills , shot self in her abdomen, OD on pills after calling [facility] and checking herself in to a remote motel ( was hard to find patient at that time ) . Given her high risk and recent  attempt and continued gesture after being at the ED , will discontinue 1:1 precaution for safety. Will continue treatment.  Derrill Center, NP 08/01/2016, 1:26 PM  I agree with findings and treatment plan of this patient

## 2016-08-01 NOTE — Progress Notes (Signed)
[place] [facility] 1:1 Observation Documentation  Time 1:1 discontinued:  0927  Patient's Behavior:  Calm, cooperative  Patient's Condition:  States that she feels hopeful  Patient's Conversation:  "I had a really good visit with my sister last night. I cried but it was a good healthy cry. I was so happy to eat at a table today!" Patient verbally contracts for safety.  [name] [name] 08/01/2016, 11:07 AM

## 2016-08-01 NOTE — Progress Notes (Signed)
[place] [facility] 1:1 Observation Documentation  Time 1:1 discontinued:  0927  Patient's Behavior:  Calm and cooperative, interactive  Patient's Condition:  Well groomed, calm, participating in all unit activities  Patient's Conversation:  Looking forward to discharge

## 2016-08-01 NOTE — Progress Notes (Signed)
[facility] Group Notes:  (Nursing/MHT/Case Management/Adjunct)  Date:  08/01/2016  Time:  9:09 PM  Type of Therapy:  Psychoeducational Skills  Participation Level:  Active  Participation Quality:  Appropriate  Affect:  Appropriate  Cognitive:  Appropriate  Insight:  Improving  Engagement in Group:  Limited  Modes of Intervention:  Education  Summary of Progress/Problems: The patient shared with the group that she had a good day since her 1:1 observation was discontinued and because she had a good visit with her daughter. In terms of the theme for the day, her support system will be comprised of her sister and her church.   [name] [name] S 08/01/2016, 9:09 PM

## 2016-08-01 NOTE — Progress Notes (Signed)
[facility] [facility] 1:1 Observation Documentation  Time 1:1 discontinued:  0927   Patient's Behavior:  Calm, cooperative  Patient's Condition: interacting in the milieu   Patient's Conversation:  Looking forward to the future  [name] [name] 08/01/2016, 5:36 PM

## 2016-08-01 NOTE — Progress Notes (Signed)
D: Patient currently calm and cooperative, denying SI.[name] Sitter currently DISCONTINUED, 1:1 discontinued by provider..  A: Staff maintained close contact at all times. Patient encouraged to express emotions and concerns. Verbal support given. Medications as ordered and evaluated for effectiveness.  R: Patient reports that she feels much better and feels calm and hopeful, no self injurious behavior at this time.

## 2016-08-01 NOTE — Progress Notes (Signed)
[place] [facility] 1:1 Observation Documentation  Time 1:1 discontinued:  0927  Patient's Behavior:  Calm and cooperative  Patient's Condition:  Pleasant, maintained safety  Patient's Conversation:  Very pleased to have had lunch in the dining room.   [name] [name] 08/01/2016, 1:01 PM

## 2016-08-01 NOTE — Progress Notes (Signed)
D: Patient's self inventory sheet: patient has good sleep, without sleep medication.good  Appetite, normal energy level, good concentration. Rated depression 0/10, hopeless 0/10, anxiety 0/10. SI/HI/AVH: denies. Physical complaints are denied, had bowel movement today (had been constipated). Goal is "things to work on when I leave (i.e. Selling care, budget)". Plans to work on "think these things through".   A: Medications administered, assessed medication knowledge and education given on medication regimen.  Emotional support and encouragement given patient. R: Denies SI and HI , contracts for safety. Safety maintained with 15 minute checks.

## 2016-08-02 MED ORDER — ZOLPIDEM TARTRATE 5 MG PO TABS
5.0000 mg | ORAL_TABLET | Freq: Every day | ORAL | Status: DC
  Administered 2016-08-02: 5 mg via ORAL
  Filled 2016-08-02: qty 1

## 2016-08-02 NOTE — Progress Notes (Signed)
[facility] MD Progress Note  08/02/2016 2:55 PM [name] [name]  MRN:  [id number] Subjective: Patient states " I am ok.'  Objective:[name] [name] is a 45 y.o. Caucasian female, divorced , lives by self in [place] ( [place])  , has a past medical history of Schizoaffective disorder , several suicide attempts who presented to [facility] from [facility] hospital after a recent suicide attempt by OD on pills and continued suicidal gestures by wrapping a cord around her neck while at the ED.  Patient seen and chart reviewed.Discussed patient with treatment team.  Patient seen as less depressed, more interactive , is seen in groups more often. Pt per staff has been tolerating her medications well. Pt today is concerned about her sleep medication- discussed other options - will start a trial of ambien. Per nursing pt is off the 1:1 precaution for safety, will continue to monitor.    Principal Problem: Schizoaffective disorder, bipolar type ([place]) Diagnosis:   Patient Active Problem List   Diagnosis Date Noted  . PTSD (post-traumatic stress disorder) [F43.10] 09/10/2015    Priority: High  . Hypertension [I10] 03/30/2016  . Serotonin syndrome [G25.79] 03/30/2016  . Elevated blood pressure [R03.0] 03/30/2016  . Leukocytosis [D72.829] 03/30/2016  . Precordial chest pain [R07.2] 03/30/2016  . Incisional hernia [K43.2] 12/24/2015  . Drug overdose, intentional ([place]) [T50.902A]   . Hyperprolactinemia ([place]) [E22.1] 09/11/2015  . Schizoaffective disorder, bipolar type ([place]) [F25.0] 09/10/2015  . Hx of borderline personality disorder [Z86.59] 09/10/2015  . Suicide attempt ([place]) [T14.91] 09/05/2015  . Overdose, drug [T50.901A] 09/05/2015  . Overdose [T50.901A] 09/05/2015  . Drug overdose [T50.901A]   . QT prolongation [I45.81]   . Weight gain [R63.5] 03/06/2015  . [place] [R23.2] 03/06/2015  . Iron deficiency anemia, unspecified [D50.9] 11/15/2013  . Liver mass, left lobe [R16.0] 05/28/2013  . Postop check  [Z09] 03/13/2013  . Postoperative wound infection [T81.4XXA] 02/23/2013  . History of colostomy [Z98.890] 02/16/2013  . Acute blood loss anemia [D62] 08/01/2012  . Renal calculus, left [N20.0] 03/31/2012  . Hydronephrosis, left [N13.30] 03/31/2012   Total Time spent with patient: 25 minutes  Past Psychiatric History: Please see H&P.   Past Medical History:  Past Medical History:  Diagnosis Date  . Anxiety   . Blood dyscrasia    pt not aware  . Depression   . Diarrhea, functional   . Diverticulitis   . Headache    migraines  . Heart murmur    asa child   . Hx pulmonary embolism 2013   after surgery from [place]  . Hyperlipidemia   . Hypertension    not on medication  . Iron deficiency anemia, unspecified 11/15/2013  . Kidney stone   . Paranoid schizophrenia ([place])   . Personality disorder   . Renal insufficiency     Past Surgical History:  Procedure Laterality Date  . Breast Reduction Left 06/2014  . COLOSTOMY  07/31/2012   Procedure: COLOSTOMY;  Surgeon: Harl Bowie, MD;  Location: [place];  Service: General;  Laterality: Right;  . COLOSTOMY CLOSURE N/A 02/15/2013   Procedure: COLOSTOMY CLOSURE;  Surgeon: Gwenyth Ober, MD;  Location: [place];  Service: General;  Laterality: N/A;  Reversal of colostomy  . COLOSTOMY REVERSAL    . DILATATION & CURETTAGE/HYSTEROSCOPY WITH TRUECLEAR N/A 10/24/2013   Procedure: DILATATION & CURETTAGE/HYSTEROSCOPY WITH TRUECLEAR, CERVICAL BLOCK;  Surgeon: Marylynn Pearson, MD;  Location: [facility];  Service: Gynecology;  Laterality: N/A;  . [place]  2013   ABDOMINAL SURGERY  .  INCISIONAL HERNIA REPAIR N/A 12/24/2015   Procedure:  INCISIONAL HERNIA REPAIR WITH MESH;  Surgeon: Coralie Keens, MD;  Location: [place];  Service: General;  Laterality: N/A;  . INSERTION OF MESH N/A 12/24/2015   Procedure: INSERTION OF MESH;  Surgeon: Coralie Keens, MD;  Location: [place];  Service: General;  Laterality: N/A;  . LAPAROTOMY  07/31/2012   Procedure:  EXPLORATORY LAPAROTOMY;  Surgeon: Harl Bowie, MD;  Location: [place];  Service: General;  Laterality: N/A;  REPAIR OF PANCREATIC INJURY, EXPLORATION OF RETROPERITONEUM.  [name] NEPHROLITHOTOMY  03/31/2012   Procedure: NEPHROLITHOTOMY PERCUTANEOUS;  Surgeon: Claybon Jabs, MD;  Location: [facility];  Service: Urology;  Laterality: Left;  . OTHER SURGICAL HISTORY     cyst removed from ovary ? side   . TUBAL LIGATION     Family History:  Family History  Problem Relation Age of Onset  . Cancer Mother     kidney  . Depression Mother   . Other Neg Hx     adrenal problem   Family Psychiatric  History: Please see H&P.  Social History:  History  Alcohol Use No     History  Drug Use No    Social History   Social History  . Marital status: Single    Spouse name: N/A  . Number of children: 2  . Years of education: N/A   Occupational History  . Disabled    Social History Main Topics  . Smoking status: Never Smoker  . Smokeless tobacco: Never Used  . Alcohol use No  . Drug use: No  . Sexual activity: Yes    Birth control/ protection: Surgical   Other Topics Concern  . None   Social History Narrative   ** Merged History Encounter **       Additional Social History:    Pain Medications: See MAR. Prescriptions: See MAR.  Over the Counter: See MAR. History of alcohol / drug use?: No history of alcohol / drug abuse                    Sleep: restless  Appetite:  Fair  Current Medications: Current Facility-Administered Medications  Medication Dose Route Frequency Provider Last Rate Last Dose  . acetaminophen (TYLENOL) tablet 650 mg  650 mg Oral Q6H PRN Ursula Alert, MD      . alum & mag hydroxide-simeth (MAALOX/MYLANTA) 200-200-20 MG/5ML suspension 30 mL  30 mL Oral Q4H PRN Hendrix Yurkovich, MD      . amLODipine (NORVASC) tablet 5 mg  5 mg Oral Daily Ursula Alert, MD   5 mg at 08/02/16 0831  . carbamazepine (TEGRETOL) chewable tablet 100 mg  100 mg Oral TID  Jenne Campus, MD   100 mg at 08/02/16 1206  . hydrOXYzine (ATARAX/VISTARIL) tablet 25 mg  25 mg Oral TID PRN Ursula Alert, MD   25 mg at 07/30/16 1540  . hydrOXYzine (ATARAX/VISTARIL) tablet 50 mg  50 mg Oral QHS Ursula Alert, MD   50 mg at 08/01/16 2101  . LORazepam (ATIVAN) tablet 1 mg  1 mg Oral Q6H PRN Ursula Alert, MD   1 mg at 08/01/16 2101   Or  . LORazepam (ATIVAN) injection 1 mg  1 mg Intramuscular Q6H PRN Darrielle Pflieger, MD      . magnesium hydroxide (MILK OF MAGNESIA) suspension 30 mL  30 mL Oral Daily PRN Takashi Korol, MD      . senna-docusate (Senokot-S) tablet 1 tablet  1 tablet  Oral q morning - 10a Derrill Center, NP   1 tablet at 08/01/16 1055  . simvastatin (ZOCOR) tablet 20 mg  20 mg Oral q1800 Ursula Alert, MD   20 mg at 08/01/16 1815  . zolpidem (AMBIEN) tablet 5 mg  5 mg Oral QHS Ursula Alert, MD        Lab Results:  Results for orders placed or performed during the hospital encounter of 07/29/16 (from the past 48 hour(s))  Carbamazepine level, total     Status: None   Collection Time: 08/01/16  6:07 AM  Result Value Ref Range   Carbamazepine Lvl 5.3 4.0 - 12.0 ug/mL    Comment: Performed at [facility]    Blood Alcohol level:  Lab Results  Component Value Date   [facility] <5 02/02/2016   ETH <5 [id number]    Metabolic Disorder Labs: Lab Results  Component Value Date   HGBA1C 6.0 (H) 07/30/2016   MPG 126 07/30/2016   MPG 131 03/30/2016   Lab Results  Component Value Date   PROLACTIN 108.5 (H) 09/10/2015   Lab Results  Component Value Date   CHOL 141 07/30/2016   TRIG 126 07/30/2016   HDL 40 (L) 07/30/2016   CHOLHDL 3.5 07/30/2016   VLDL 25 07/30/2016   LDLCALC 76 07/30/2016    Physical Findings: AIMS: Facial and Oral Movements Muscles of Facial Expression: None, normal Lips and Perioral Area: None, normal Jaw: None, normal Tongue: None, normal,Extremity Movements Upper (arms, wrists, hands, fingers): None, normal Lower  (legs, knees, ankles, toes): None, normal, Trunk Movements Neck, shoulders, hips: None, normal, Overall Severity Severity of abnormal movements (highest score from questions above): None, normal Incapacitation due to abnormal movements: None, normal Patient's awareness of abnormal movements (rate only patient's report): No Awareness, Dental Status Current problems with teeth and/or dentures?: No Does patient usually wear dentures?: No  CIWA:    COWS:     Musculoskeletal: Strength & Muscle Tone: within normal limits Gait & Station: normal Patient leans: N/A  Psychiatric Specialty Exam: Physical Exam  Nursing note and vitals reviewed. Constitutional:  I concur with PE done in ED    Review of Systems  Psychiatric/Behavioral: Positive for depression. The patient is nervous/anxious.   All other systems reviewed and are negative.   Blood pressure 115/81, pulse (!) 104, temperature 98.8 F (37.1 C), temperature source Oral, resp. rate 18, height 5\' 4"  (1.626 m), weight 97.5 kg (215 lb), last menstrual period 06/30/2016, SpO2 99 %.Body mass index is 36.9 kg/m.  General Appearance: Guarded  Eye Contact:  Fair  Speech:  Normal Rate  Volume:  Normal  Mood:  Anxious and Depressed improving  Affect:  Appropriate  Thought Process:  Goal Directed and Descriptions of Associations: Intact  Orientation:  Full (Time, Place, and Person)  Thought Content:  Rumination  Suicidal Thoughts:  denies at this time - however is S/P suicide attempt and had continued suicidal gestures while at the ED will continue to observe  Homicidal Thoughts:  No  Memory:  Immediate;   Fair Recent;   Fair Remote;   Fair  Judgement:  Impaired  Insight:  Shallow  Psychomotor Activity:  Decreased  Concentration:  Concentration: Fair and Attention Span: Fair  Recall:  [employer] of Knowledge:  Fair  Language:  Fair  Akathisia:  No  Handed:  Right  AIMS (if indicated):     Assets:  Desire for Improvement Housing   ADL's:  Intact  Cognition:  WNL  Sleep:  Number of Hours: 6.75     Treatment Plan Summary:Danikah G Seaton is a 45 y.o. Caucasian female, divorced , lives by self in [place] ( [place])  , has a past medical history of Schizoaffective disorder , several suicide attempts who presented to [facility] from [facility] hospital after a recent suicide attempt by OD on pills and continued suicidal gestures by wrapping a cord around her neck while at the ED.  [facility] - has had several significant suicide attempts in the past - OD on pills , shot self in her abdomen, OD on pills after calling [facility] and checking herself in to a remote motel ( was hard to find patient at that time ) . Patient seems to be making progress - depression is improving. Will continue treatment. Daily contact with patient to assess and evaluate symptoms and progress in treatment and Medication management    Pt to be restarted on Invega Trinza 546 mg IM - q 3 months - last dose on July 3 rd 2017 ( verified per [facility])  Will continue Tegretol 100 mg po tid for mood sx. Tegretol level on 08/01/16- 5.3 ( wnl)  Will add Ambien 5 mg po qhs for sleep. Will continue to monitor vitals ,medication compliance and treatment side effects while patient is here.  Will monitor for medical issues as well as call consult as needed. CSW will continue  working on disposition.Obtained collateral information from [facility]. Patient to participate in therapeutic milieu  Shardae Kleinman, MD 08/02/2016, 2:55 PM

## 2016-08-02 NOTE — Progress Notes (Signed)
Adult Psychoeducational Group Note  Date:  08/02/2016 Time:  10:22 PM  Group Topic/Focus:  Wrap-Up Group:   The focus of this group is to help patients review their daily goal of treatment and discuss progress on daily workbooks.   Participation Level:  Minimal  Participation Quality:  Appropriate  Affect:  Flat  Cognitive:  Appropriate  Insight: Appropriate  Engagement in Group:  Engaged  Modes of Intervention:  Support  Additional Comments:  Patient attended and participated in group tonight. She reports that she went to the gym, went to group and meals today. Today she also talked to her daughter. Salley Scarlet [facility] 08/02/2016, 10:22 PM

## 2016-08-02 NOTE — Progress Notes (Signed)
DAR NOTE: Patient is calm and pleasant.  Mood and affect is bright.  Denies suicidal thoughts, pain, auditory and visual hallucinations.  Rates depression at 0, hopelessness at 0, and anxiety at 0.  Described energy level as normal and concentration as good.  Maintained on routine safety checks.  Medications given as prescribed.  Support and encouragement offered as needed.  Attended group and participated.  States goal for today is "staying upbeat".  Patient observed socializing with peers in the dayroom.  Offered no complaint.

## 2016-08-02 NOTE — BHH Group Notes (Signed)
[place] [facility] LCSW Group Therapy  08/02/2016 1:15 pm  Type of Therapy: Process Group Therapy  Participation Level:  Active  Participation Quality:  Appropriate  Affect:  Flat  Cognitive:  Oriented  Insight:  Improving  Engagement in Group:  Limited  Engagement in Therapy:  Limited  Modes of Intervention:  Activity, Clarification, Education, Problem-solving and Support  Summary of Progress/Problems: Today's group addressed the issue of overcoming obstacles.  Patients were asked to identify their biggest obstacle post d/c that stands in the way of their on-going success, and then problem solve as to how to manage this.  Talked about her worries related to having to do everything herself since her divorce in the fall-much of which revolved around taking care of the finances-gave example of annual taxes on auto license that she just learned.  Able to identify that she has been learning in a hurry and is on schedule with anyone who has gone through the same thing.  Also identified needing to find her own friends, and talked about groups in the community and church as good potential sources.  Trish Mage 08/02/2016   3:38 PM

## 2016-08-02 NOTE — Progress Notes (Signed)
Recreation Therapy Notes  Date: 08/02/16 Time: 1005 Location: 500 Hall Dayroom  Group Topic: Communication  Goal Area(s) Addresses:  Patient will effectively communicate with peers in group.  Patient will verbalize benefit of healthy communication. Patient will verbalize positive effect of healthy communication on post d/c goals.  Patient will identify communication techniques that made activity effective for group.   Behavioral Response: Attentive, minimal  Intervention: Bag, various items  Activity: What is it?  LRT placed various items in a bag.  Patients took turns taking an item from the bag and describing it to the rest of the group.  The remaining members of the group had to guess what was being described to them.  The person that guessed the item, would come up and pick a different item to describe.   Education: Communication, Discharge Planning  Education Outcome: Acknowledges understanding/In group clarification offered/Needs additional education.   Clinical Observations/Feedback:  Pt participated with prompting.  Pt stated communication will "help people understand your feelings and what you may be dealing with".     Victorino Sparrow, LRT/CTRS

## 2016-08-02 NOTE — Care Management Utilization Note (Signed)
   Per State Regulation 482.30  This chart was reviewed for necessity with respect to the patient's Admission/ Duration of stay.  Next review date: 08/05/16  Skipper Cliche RN, BSN

## 2016-08-03 MED ORDER — CARBAMAZEPINE 100 MG PO CHEW: 100.0000 mg | CHEWABLE_TABLET | Freq: Three times a day (TID) | ORAL | 0 refills | Status: DC

## 2016-08-03 MED ORDER — ZOLPIDEM TARTRATE 5 MG PO TABS: 5.0000 mg | ORAL_TABLET | Freq: Every day | ORAL | 0 refills | Status: DC

## 2016-08-03 MED ORDER — SENNOSIDES-DOCUSATE SODIUM 8.6-50 MG PO TABS: 1.0000 | ORAL_TABLET | Freq: Every morning | ORAL | Status: DC

## 2016-08-03 MED ORDER — HYDROXYZINE HCL 25 MG PO TABS: 25.0000 mg | ORAL_TABLET | Freq: Three times a day (TID) | ORAL | 0 refills | Status: DC | PRN

## 2016-08-03 MED ORDER — AMLODIPINE BESYLATE 5 MG PO TABS: 5.0000 mg | ORAL_TABLET | Freq: Every day | ORAL | Status: DC

## 2016-08-03 MED ORDER — SIMVASTATIN 20 MG PO TABS: 20.0000 mg | ORAL_TABLET | Freq: Every day | ORAL | 0 refills | Status: DC

## 2016-08-03 NOTE — Discharge Summary (Signed)
Physician Discharge Summary Note  Patient:  [name] [name] is an 45 y.o., female  MRN:  [id number] DOB:  November 25, 1971  Patient phone:  [phone] (home)   Patient address:   [place] [address],   Total Time spent with patient: Greater than 30 minutes  Date of Admission:  07/29/2016  Date of Discharge: 08-03-16  Reason for Admission: Suicide attempt by choking  Principal Problem: Schizoaffective disorder, bipolar type [facility]) Discharge Diagnoses: Patient Active Problem List   Diagnosis Date Noted  . Hypertension [I10] 03/30/2016  . Serotonin syndrome [G25.79] 03/30/2016  . Elevated blood pressure [R03.0] 03/30/2016  . Leukocytosis [D72.829] 03/30/2016  . Precordial chest pain [R07.2] 03/30/2016  . Incisional hernia [K43.2] 12/24/2015  . Drug overdose, intentional ([place]) [T50.902A]   . Hyperprolactinemia ([place]) [E22.1] 09/11/2015  . Schizoaffective disorder, bipolar type ([place]) [F25.0] 09/10/2015  . Hx of borderline personality disorder [Z86.59] 09/10/2015  . PTSD (post-traumatic stress disorder) [F43.10] 09/10/2015  . Suicide attempt ([place]) [T14.91] 09/05/2015  . Overdose, drug [T50.901A] 09/05/2015  . Overdose [T50.901A] 09/05/2015  . Drug overdose [T50.901A]   . QT prolongation [I45.81]   . Weight gain [R63.5] 03/06/2015  . [place] [R23.2] 03/06/2015  . Iron deficiency anemia, unspecified [D50.9] 11/15/2013  . Liver mass, left lobe [R16.0] 05/28/2013  . Postop check [Z09] 03/13/2013  . Postoperative wound infection [T81.4XXA] 02/23/2013  . History of colostomy [Z98.890] 02/16/2013  . Acute blood loss anemia [D62] 08/01/2012  . Renal calculus, left [N20.0] 03/31/2012  . Hydronephrosis, left [N13.30] 03/31/2012   Past Psychiatric History: See H&P  Past Medical History:  Past Medical History:  Diagnosis Date  . Anxiety   . Blood dyscrasia    pt not aware  . Depression   . Diarrhea, functional   . Diverticulitis   . Headache    migraines   . Heart murmur    asa child   . Hx pulmonary embolism 2013   after surgery from [place]  . Hyperlipidemia   . Hypertension    not on medication  . Iron deficiency anemia, unspecified 11/15/2013  . Kidney stone   . Paranoid schizophrenia ([place])   . Personality disorder   . Renal insufficiency     Past Surgical History:  Procedure Laterality Date  . Breast Reduction Left 06/2014  . COLOSTOMY  07/31/2012   Procedure: COLOSTOMY;  Surgeon: Harl Bowie, MD;  Location: [place];  Service: General;  Laterality: Right;  . COLOSTOMY CLOSURE N/A 02/15/2013   Procedure: COLOSTOMY CLOSURE;  Surgeon: Gwenyth Ober, MD;  Location: [place];  Service: General;  Laterality: N/A;  Reversal of colostomy  . COLOSTOMY REVERSAL    . DILATATION & CURETTAGE/HYSTEROSCOPY WITH TRUECLEAR N/A 10/24/2013   Procedure: DILATATION & CURETTAGE/HYSTEROSCOPY WITH TRUECLEAR, CERVICAL BLOCK;  Surgeon: Marylynn Pearson, MD;  Location: [facility];  Service: Gynecology;  Laterality: N/A;  . [place]  2013   ABDOMINAL SURGERY  . INCISIONAL HERNIA REPAIR N/A 12/24/2015   Procedure:  INCISIONAL HERNIA REPAIR WITH MESH;  Surgeon: Coralie Keens, MD;  Location: [place];  Service: General;  Laterality: N/A;  . INSERTION OF MESH N/A 12/24/2015   Procedure: INSERTION OF MESH;  Surgeon: Coralie Keens, MD;  Location: [place];  Service: General;  Laterality: N/A;  . LAPAROTOMY  07/31/2012   Procedure: EXPLORATORY LAPAROTOMY;  Surgeon: Harl Bowie, MD;  Location: [place];  Service: General;  Laterality: N/A;  REPAIR OF PANCREATIC INJURY, EXPLORATION OF RETROPERITONEUM.  [name] NEPHROLITHOTOMY  03/31/2012   Procedure: NEPHROLITHOTOMY PERCUTANEOUS;  Surgeon: Claybon Jabs, MD;  Location: [facility];  Service: Urology;  Laterality: Left;  . OTHER SURGICAL HISTORY     cyst removed from ovary ? side   . TUBAL LIGATION     Family History:  Family History  Problem Relation Age of Onset  . Cancer Mother     kidney  . Depression Mother   . Other Neg Hx      adrenal problem   Family Psychiatric  History: See H&P  Social History:  History  Alcohol Use No     History  Drug Use No    Social History   Social History  . Marital status: Single    Spouse name: N/A  . Number of children: 2  . Years of education: N/A   Occupational History  . Disabled    Social History Main Topics  . Smoking status: Never Smoker  . Smokeless tobacco: Never Used  . Alcohol use No  . Drug use: No  . Sexual activity: Yes    Birth control/ protection: Surgical   Other Topics Concern  . None   Social History Narrative   ** Merged History Encounter **       Hospital Course: Caucasian female, divorced, lives by self in [place] ( [place]), has a past history of Schizoaffective disorder, several suicide attempts , Heart murmur; Anemia; Chronic kidney disease. Patient overdosed on Percocet, Trazodone, Ativan and Tylenol. Pt informed [name] [name] that she been suicidal for the past 2 days. Pt has a hx of depression and ECT Treatments. Pt has previous SI attempts.Pt wasfound with a telephone cord around her neck per [facility] staff. Pt contacted [facility] after SI attempt.".  [name] [name] was admitted to the hospital with her UDS test reports showing positive Benzodiazepine. However, her reason for admission was worsening symptoms of Schizoaffective disorder triggering suicide attempt by overdose on Percocet tablets. And while at the [facility], she found by a staff with telephone cord around her neck in a suicide gesture. Report indicated that [name] [name] has history of multiple suicide attempts. She was in need of mood stabilization treatment. After evaluation of her symptoms, [name] [name] was started on medication regimen for her presenting symptoms. Her medication regimen included; Tegretol 100 mg for mood stabilization, Hydroxyzine 25 mg prn for anxiety & Ambien 5 mg daily for insomnia. She was also enrolled & participated in the group counseling sessions being offered and  held on this unit, she learned coping skills that should help her cope better & maintain mood stability after discharge. She presented other significant pre-existing health issues that required treatment & was resumed on her pertinent home medications for those health issues. Riniyah tolerated her treatment regimen without any significant adverse effects and or reactions reported.  During the course of her hospitalization, Terrace's symptoms were evaluated on daily basis by a clinical provider to assure they were responding to her treatment regimen. This is evidenced by her reports of decreasing symptoms, improved mood & presentation of good affect. She is currently being discharged to continue psychiatric treatment & medication management on an outpatient basis with her Act Team as noted below. She is provided with all the pertinent information required to make this appointment without problems.   On this day of her hospital discharge, Legacy is in much improved condition than upon admission. She contracted for her safety & felt more in control of her mood. Her symptoms were reported as significantly decreased or resolved completely.  Marchella denied SIHI & voiced no AVH. She is instructed & motivated to continue taking medications with a goal of continued improvement in mental health. She was picked up by her family. She left [facility] in no apparent distress with all belongings.  Physical Findings: AIMS: Facial and Oral Movements Muscles of Facial Expression: None, normal Lips and Perioral Area: None, normal Jaw: None, normal Tongue: None, normal,Extremity Movements Upper (arms, wrists, hands, fingers): None, normal Lower (legs, knees, ankles, toes): None, normal, Trunk Movements Neck, shoulders, hips: None, normal, Overall Severity Severity of abnormal movements (highest score from questions above): None, normal Incapacitation due to abnormal movements: None, normal Patient's awareness of abnormal  movements (rate only patient's report): No Awareness, Dental Status Current problems with teeth and/or dentures?: No Does patient usually wear dentures?: No  CIWA:    COWS:     Musculoskeletal: Strength & Muscle Tone: within normal limits Gait & Station: normal Patient leans: N/A  Psychiatric Specialty Exam: Physical Exam  Constitutional: She appears well-developed.  HENT:  Head: Normocephalic.  Eyes: Pupils are equal, round, and reactive to light.  Neck: Normal range of motion.  Cardiovascular: Normal rate.   Respiratory: Effort normal.  GI: Soft.  Genitourinary:  Genitourinary Comments: See Md's SRA  Musculoskeletal: Normal range of motion.  Neurological: She is alert.  Skin: Skin is warm.    Review of Systems  Constitutional: Negative.   HENT: Negative.   Eyes: Negative.   Respiratory: Negative.   Cardiovascular: Negative.   Gastrointestinal: Negative.   Genitourinary: Negative.   Musculoskeletal: Negative.   Skin: Negative.   Neurological: Negative.   Endo/Heme/Allergies: Negative.   Psychiatric/Behavioral: Positive for depression. Negative for hallucinations, memory loss and suicidal ideas. The patient has insomnia (stable). The patient is not nervous/anxious.     Blood pressure 103/63, pulse (!) 105, temperature 98.4 F (36.9 C), temperature source Oral, resp. rate 20, height 5\' 4"  (1.626 m), weight 97.5 kg (215 lb), SpO2 99 %.Body mass index is 36.9 kg/m.  See Md's SRA   Have you used any form of tobacco in the last 30 days? (Cigarettes, Smokeless Tobacco, Cigars, and/or Pipes): No  Has this patient used any form of tobacco in the last 30 days? (Cigarettes, Smokeless Tobacco, Cigars, and/or Pipes): No  Blood Alcohol level:  Lab Results  Component Value Date   ETH <5 02/02/2016   ETH <5 [id number]   Metabolic Disorder Labs:  Lab Results  Component Value Date   HGBA1C 6.0 (H) 07/30/2016   MPG 126 07/30/2016   MPG 131 03/30/2016   Lab Results   Component Value Date   PROLACTIN 108.5 (H) 09/10/2015   Lab Results  Component Value Date   CHOL 141 07/30/2016   TRIG 126 07/30/2016   HDL 40 (L) 07/30/2016   CHOLHDL 3.5 07/30/2016   VLDL 25 07/30/2016   LDLCALC 76 07/30/2016   See Psychiatric Specialty Exam and Suicide Risk Assessment completed by Attending Physician prior to discharge.  Discharge destination:  Home  Is patient on multiple antipsychotic therapies at discharge:  No   Has Patient had three or more failed trials of antipsychotic monotherapy by history:  No  Recommended Plan for Multiple Antipsychotic Therapies: NA    Medication List    STOP taking these medications   busPIRone 10 MG tablet Commonly known as:  BUSPAR   citalopram 40 MG tablet Commonly known as:  CELEXA   INVEGA TRINZA IM   meloxicam 15 MG tablet Commonly known as:  MOBIC  metoprolol tartrate 25 MG tablet Commonly known as:  LOPRESSOR   naproxen 500 MG tablet Commonly known as:  NAPROSYN   omeprazole 20 MG capsule Commonly known as:  PRILOSEC   traZODone 50 MG tablet Commonly known as:  DESYREL     TAKE these medications     Indication  amLODipine 5 MG tablet Commonly known as:  NORVASC Take 1 tablet (5 mg total) by mouth at bedtime. For high blood pressure What changed:  additional instructions  Indication:  High Blood Pressure Disorder   carbamazepine 100 MG chewable tablet Commonly known as:  TEGRETOL Chew 1 tablet (100 mg total) by mouth 3 (three) times daily. For mood stabilization  Indication:  Mood stabilization   hydrOXYzine 25 MG tablet Commonly known as:  ATARAX/VISTARIL Take 1 tablet (25 mg total) by mouth 3 (three) times daily as needed for anxiety. What changed:  medication strength  how much to take  when to take this  reasons to take this  Indication:  Anxiety   senna-docusate 8.6-50 MG tablet Commonly known as:  Senokot-S Take 1 tablet by mouth every morning. (This medicine can be  purchased from over the counter at the pharmacy): For constipation  Indication:  Constipation   simvastatin 20 MG tablet Commonly known as:  ZOCOR Take 1 tablet (20 mg total) by mouth daily at 6 PM. For high cholesterol What changed:  additional instructions  Indication:  Inherited Homozygous Hypercholesterolemia, High Amount of Triglycerides in the Blood   zolpidem 5 MG tablet Commonly known as:  AMBIEN Take 1 tablet (5 mg total) by mouth at bedtime. For sleep  Indication:  Trouble Sleeping      Follow-up Information    [facility] .   Why:  Resume your ususal schedule of seeing your therapist on Monday and your Peer Support Specialist on Wednesday Contact information: [facility] Team [address] [place], [place] [address] [phone]         Follow-up recommendations: Activity:  As tolerated Diet: As recommended by your primary care doctor. Keep all scheduled follow-up appointments as recommended.   Comments: Patient is instructed prior to discharge to: Take all medications as prescribed by his/her mental healthcare provider. Report any adverse effects and or reactions from the medicines to his/her outpatient provider promptly. Patient has been instructed & cautioned: To not engage in alcohol and or illegal drug use while on prescription medicines. In the event of worsening symptoms, patient is instructed to call the crisis hotline, 911 and or go to the nearest ED for appropriate evaluation and treatment of symptoms. To follow-up with his/her primary care provider for your other medical issues, concerns and or health care needs.   Signed: Encarnacion Slates, NP, PMHNP, FNP-BC 08/04/2016, 9:06 AM

## 2016-08-03 NOTE — Progress Notes (Signed)
  [facility] Adult Case Management Discharge Plan :  Will you be returning to the same living situation after discharge:  Yes,  home At discharge, do you have transportation home?: Yes,  family Do you have the ability to pay for your medications: Yes,  MCD  Release of information consent forms completed and in the chart;  Patient's signature needed at discharge.  Patient to Follow up at: Follow-up Information    [facility] .   Why:  Resume your ususal schedule of seeing your therapist on Monday and your Peer Support Specialist on Wednesday Contact information: [facility] Team [address] [place], [place] [address] [phone]          Next level of care provider has access to [place] and Suicide Prevention discussed: Yes,  yes  Have you used any form of tobacco in the last 30 days? (Cigarettes, Smokeless Tobacco, Cigars, and/or Pipes): No  Has patient been referred to the Quitline?: N/A patient is not a smoker  Patient has been referred for addiction treatment: N/A  Trish Mage 08/03/2016, 3:09 PM

## 2016-08-03 NOTE — Progress Notes (Signed)
Recreation Therapy Notes  Date: 08/03/16 Time: 1000 Location: [address] Day[address]  Group Topic: Communication, Team Building, Problem Solving  Goal Area(s) Addresses:  Patient will effectively work with peer towards shared goal.  Patient will identify skill used to make activity successful.  Patient will identify how skills used during activity can be used to reach post d/c goals.   Behavioral Response: Engaged  Intervention: [place]  Activity: Keep It Chartered certified accountant.  Patients will sit in a circle and pass the ball to various members of the group around the circle without letting the ball hit the ground.  Education: Education officer, community, Dentist.   Education Outcome: Acknowledges education/In group clarification offered/Needs additional education.   Clinical Observations/Feedback: Pt stated they had to follow the rules and use eye contact to complete the activity.  Pt stated she felt disappointment when she couldn't keep the ball in play because it felt like she let the group down.  Pt expressed that teamwork in real life is important because "you can't do it alone".  Pt also stated that post discharge teamwork "helps you learn to trust other people".     Victorino Sparrow, LRT/CTRS

## 2016-08-03 NOTE — [facility] Suicide Risk Assessment (Signed)
[facility] Discharge Suicide Risk Assessment   Principal Problem: Schizoaffective disorder, bipolar type [facility]) Discharge Diagnoses:  Patient Active Problem List   Diagnosis Date Noted  . PTSD (post-traumatic stress disorder) [F43.10] 09/10/2015    Priority: High  . Hypertension [I10] 03/30/2016  . Serotonin syndrome [G25.79] 03/30/2016  . Elevated blood pressure [R03.0] 03/30/2016  . Leukocytosis [D72.829] 03/30/2016  . Precordial chest pain [R07.2] 03/30/2016  . Incisional hernia [K43.2] 12/24/2015  . Drug overdose, intentional ([place]) [T50.902A]   . Hyperprolactinemia ([place]) [E22.1] 09/11/2015  . Schizoaffective disorder, bipolar type ([place]) [F25.0] 09/10/2015  . Hx of borderline personality disorder [Z86.59] 09/10/2015  . Suicide attempt ([place]) [T14.91] 09/05/2015  . Overdose, drug [T50.901A] 09/05/2015  . Overdose [T50.901A] 09/05/2015  . Drug overdose [T50.901A]   . QT prolongation [I45.81]   . Weight gain [R63.5] 03/06/2015  . [place]ushing [R23.2] 03/06/2015  . Iron deficiency anemia, unspecified [D50.9] 11/15/2013  . Liver mass, left lobe [R16.0] 05/28/2013  . Postop check [Z09] 03/13/2013  . Postoperative wound infection [T81.4XXA] 02/23/2013  . History of colostomy [Z98.890] 02/16/2013  . Acute blood loss anemia [D62] 08/01/2012  . Renal calculus, left [N20.0] 03/31/2012  . Hydronephrosis, left [N13.30] 03/31/2012    Total Time spent with patient: 30 minutes  Musculoskeletal: Strength & Muscle Tone: within normal limits Gait & Station: normal Patient leans: N/A  Psychiatric Specialty Exam: Review of Systems  Psychiatric/Behavioral: Negative for depression, hallucinations and suicidal ideas.  All other systems reviewed and are negative.   Blood pressure 103/63, pulse (!) 105, temperature 98.4 F (36.9 C), temperature source Oral, resp. rate 20, height 5\' 4"  (1.626 m), weight 97.5 kg (215 lb), SpO2 99 %.Body mass index is 36.9 kg/m.  General Appearance: Casual  Eye  Contact::  Fair  Speech:  Clear and Coherent409  Volume:  Normal  Mood:  Euthymic  Affect:  Congruent  Thought Process:  Goal Directed and Descriptions of Associations: Intact  Orientation:  Full (Time, Place, and Person)  Thought Content:  Logical  Suicidal Thoughts:  No  Homicidal Thoughts:  No  Memory:  Immediate;   Fair Recent;   Fair Remote;   Fair  Judgement:  Fair  Insight:  Fair  Psychomotor Activity:  Normal  Concentration:  Fair  Recall:  [employer] of Knowledge:Fair  Language: Fair  Akathisia:  No  Handed:  Right  AIMS (if indicated):     Assets:  Communication Skills  Sleep:  Number of Hours: 5.75  Cognition: WNL  ADL's:  Intact   Mental Status Per Nursing Assessment::   On Admission:     Demographic Factors:  Caucasian  Loss Factors: NA  Historical Factors: Impulsivity  Risk Reduction Factors:   Positive social support  Continued Clinical Symptoms:  Previous Psychiatric Diagnoses and Treatments  Cognitive Features That Contribute To Risk:  Polarized thinking    Suicide Risk:  Minimal: No identifiable suicidal ideation.  Patients presenting with no risk factors but with morbid ruminations; may be classified as minimal risk based on the severity of the depressive symptoms  Follow-up Information    [facility] .   Why:  Resume your ususal schedule of seeing your therapist on Monday and your Peer Support Specialist on Wednesday Contact information: [facility] ACT Team [address] [place], [place] [address] [phone]          Plan Of Care/Follow-up recommendations:  Activity:  no restrictions Diet:  regular Tests:  as needed Other:  follow up with aftercare  Brazen Domangue, MD 08/03/2016,  9:32 AM

## 2016-08-03 NOTE — [facility] Suicide Risk Assessment (Signed)
[facility] INPATIENT:  Family/Significant Other Suicide Prevention Education  Suicide Prevention Education:  Patient Refusal for Family/Significant Other Suicide Prevention Education: The patient [name] [name] has refused to provide written consent for family/significant other to be provided Family/Significant Other Suicide Prevention Education during admission and/or prior to discharge.  Physician notified.  [name] 08/03/2016, 3:09 PM

## 2016-08-03 NOTE — Tx Team (Signed)
Interdisciplinary Treatment Plan Update (Adult)  Date:  08/03/2016   Time Reviewed:  3:05 PM   Progress in Treatment: Attending groups: Yes. Participating in groups:  Yes. Taking medication as prescribed:  Yes. Tolerating medication:  Yes. Family/Significant other contact made:  No Patient understands diagnosis:  Yes  As evidenced by seeking help with depression Discussing patient identified problems/goals with staff:  Yes, see initial care plan. Medical problems stabilized or resolved:  Yes. Denies suicidal/homicidal ideation: Yes. Issues/concerns per patient self-inventory:  No. Other:  New problem(s) identified:  Discharge Plan or Barriers: see below  Reason for Continuation of Hospitalization:   Comments:   Pt is currently on 1:1 precaution due to her being depressed and is S/P two recent suicide attempts. Pt reports that she recently got divorced from her husband and ever since then has been going through a lot of other changes . Pt reports that she has to do a lot of things all by herself and that has been very scary. Pt also reports that her ACTT is splitting up and now she is going to get a new ACTT and that has been very stressful.  Pt to be restarted on Invega Trinza 546 mg IM - q 3 months - last dose in July as per pt. Will start Tegretol 100 mg po tid for mood sx. Tegretol level in 3 days.  Estimated length of stay: D/C today  New goal(s):  Review of initial/current patient goals per problem list:   Review of initial/current patient goals per problem list:  1. Goal(s): Patient will participate in aftercare plan   Met: Yes   Target date: 3-5 days post admission date   As evidenced by: Patient will participate within aftercare plan AEB aftercare provider and housing plan at discharge being identified. 07/30/16:  Return home, follow up ACT team.   2. Goal (s): Patient will exhibit decreased depressive symptoms and suicidal ideations.   Met: No   Target  date: 3-5 days post admission date   As evidenced by: Patient will utilize self rating of depression at 3 or below and demonstrate decreased signs of depression or be deemed stable for discharge by MD. 07/30/16:  Suicide attempt prior to hospitalization and again in ED.  Rates depression a 10 today. 08/03/16:  Denies depression today      5. Goal(s): Patient will demonstrate decreased signs of psychosis  * Met: Yes  * Target date: 3-5 days post admission date  * As evidenced by: Patient will demonstrate decreased frequency of AVH or return to baseline function 07/30/16:  Denies psychosis today.  Presents with no signs nor symptoms          Attendees: Patient:  08/03/2016 3:05 PM   Family:   08/03/2016 3:05 PM   Physician:  Ursula Alert, MD 08/03/2016 3:05 PM   Nursing:   Phillis Haggis, RN 08/03/2016 3:05 PM   CSW:    Roque Lias, LCSW   08/03/2016 3:05 PM   Other:  08/03/2016 3:05 PM   Other:   08/03/2016 3:05 PM   Other:  Lars Pinks, Nurse CM 08/03/2016 3:05 PM   Other:   08/03/2016 3:05 PM   Other:  Norberto Sorenson, [place]  08/03/2016 3:05 PM   Other:  08/03/2016 3:05 PM   Other:  08/03/2016 3:05 PM   Other:  08/03/2016 3:05 PM   Other:  08/03/2016 3:05 PM   Other:  08/03/2016 3:05 PM   Other:   08/03/2016 3:05 PM  Scribe for Treatment Team:   Trish Mage 08/03/2016 3:05 PM

## 2016-08-03 NOTE — Progress Notes (Signed)
Patient discharged to lobby. Patient was stable and appreciative at that time. All papers and prescriptions were given and valuables returned. Verbal understanding expressed. Denies SI/HI and A/VH. Patient given opportunity to express concerns and ask questions.  

## 2016-08-03 NOTE — Plan of Care (Signed)
Problem: Activity: Goal: Sleeping patterns will improve Outcome: Progressing Pt slept 6.75 hours last night according to flowsheet.    

## 2016-08-03 NOTE — Progress Notes (Signed)
D: Pt has anxious affect and mood.  She reports her goal is to get some rest tonight.  Pt reports she is discharging tomorrow and she feels safe to do so.  Pt denies SI/HI, denies hallucinations, denies pain.  Pt has been visible in milieu interacting with peers and staff appropriately.  Pt attended evening group.   A: Introduced self to pt.  Met with pt and offered support and encouragement.  Medications administered per order.   R: Pt is compliant with medications.  Pt verbally contracts for safety.  Will continue to monitor and assess.

## 2016-08-04 DIAGNOSIS — Z880 Allergy status to penicillin: Secondary | ICD-10-CM | POA: Diagnosis not present

## 2016-08-04 DIAGNOSIS — G4730 Sleep apnea, unspecified: Secondary | ICD-10-CM | POA: Diagnosis not present

## 2016-08-04 DIAGNOSIS — Z79899 Other long term (current) drug therapy: Secondary | ICD-10-CM | POA: Diagnosis not present

## 2016-08-04 DIAGNOSIS — F333 Major depressive disorder, recurrent, severe with psychotic symptoms: Secondary | ICD-10-CM | POA: Diagnosis not present

## 2016-08-04 DIAGNOSIS — Z888 Allergy status to other drugs, medicaments and biological substances status: Secondary | ICD-10-CM | POA: Diagnosis not present

## 2016-08-06 DIAGNOSIS — R42 Dizziness and giddiness: Secondary | ICD-10-CM | POA: Diagnosis not present

## 2016-08-06 DIAGNOSIS — J069 Acute upper respiratory infection, unspecified: Secondary | ICD-10-CM | POA: Diagnosis not present

## 2016-08-06 DIAGNOSIS — N760 Acute vaginitis: Secondary | ICD-10-CM | POA: Diagnosis not present

## 2016-08-09 DIAGNOSIS — Z6837 Body mass index (BMI) 37.0-37.9, adult: Secondary | ICD-10-CM | POA: Diagnosis not present

## 2016-08-09 DIAGNOSIS — Z1231 Encounter for screening mammogram for malignant neoplasm of breast: Secondary | ICD-10-CM | POA: Diagnosis not present

## 2016-08-09 DIAGNOSIS — Z124 Encounter for screening for malignant neoplasm of cervix: Secondary | ICD-10-CM | POA: Diagnosis not present

## 2016-08-09 DIAGNOSIS — Z01419 Encounter for gynecological examination (general) (routine) without abnormal findings: Secondary | ICD-10-CM | POA: Diagnosis not present

## 2016-08-09 DIAGNOSIS — R351 Nocturia: Secondary | ICD-10-CM | POA: Diagnosis not present

## 2016-08-17 DIAGNOSIS — M1990 Unspecified osteoarthritis, unspecified site: Secondary | ICD-10-CM | POA: Diagnosis not present

## 2016-08-20 ENCOUNTER — Ambulatory Visit
Admission: RE | Admit: 2016-08-20 | Discharge: 2016-08-20 | Disposition: A | Discharge status: Home / Self Care | Payer: Medicare Other | Source: Ambulatory Visit | Attending: Family Medicine | Admitting: Family Medicine

## 2016-08-20 ENCOUNTER — Other Ambulatory Visit: Payer: Self-pay | Admitting: Family Medicine

## 2016-08-20 DIAGNOSIS — M179 Osteoarthritis of knee, unspecified: Secondary | ICD-10-CM | POA: Diagnosis not present

## 2016-08-20 DIAGNOSIS — M1990 Unspecified osteoarthritis, unspecified site: Secondary | ICD-10-CM

## 2016-08-27 DIAGNOSIS — M25562 Pain in left knee: Secondary | ICD-10-CM | POA: Diagnosis not present

## 2016-08-27 DIAGNOSIS — G8929 Other chronic pain: Secondary | ICD-10-CM | POA: Diagnosis not present

## 2016-08-27 DIAGNOSIS — M25561 Pain in right knee: Secondary | ICD-10-CM | POA: Diagnosis not present

## 2016-08-27 DIAGNOSIS — R262 Difficulty in walking, not elsewhere classified: Secondary | ICD-10-CM | POA: Diagnosis not present

## 2016-09-01 DIAGNOSIS — F251 Schizoaffective disorder, depressive type: Secondary | ICD-10-CM | POA: Diagnosis not present

## 2016-09-09 DIAGNOSIS — N3090 Cystitis, unspecified without hematuria: Secondary | ICD-10-CM | POA: Diagnosis not present

## 2016-09-16 ENCOUNTER — Other Ambulatory Visit: Payer: Self-pay | Admitting: Surgery

## 2016-09-16 DIAGNOSIS — Z8719 Personal history of other diseases of the digestive system: Secondary | ICD-10-CM | POA: Diagnosis not present

## 2016-09-16 DIAGNOSIS — R1033 Periumbilical pain: Secondary | ICD-10-CM | POA: Diagnosis not present

## 2016-09-16 DIAGNOSIS — Z9889 Other specified postprocedural states: Secondary | ICD-10-CM | POA: Diagnosis not present

## 2016-09-17 ENCOUNTER — Other Ambulatory Visit: Payer: Self-pay | Admitting: Surgery

## 2016-09-17 DIAGNOSIS — L7634 Postprocedural seroma of skin and subcutaneous tissue following other procedure: Secondary | ICD-10-CM

## 2016-09-21 ENCOUNTER — Ambulatory Visit
Admission: RE | Admit: 2016-09-21 | Discharge: 2016-09-21 | Disposition: A | Discharge status: Home / Self Care | Payer: Medicare Other | Source: Ambulatory Visit | Attending: Surgery | Admitting: Surgery

## 2016-09-21 DIAGNOSIS — N132 Hydronephrosis with renal and ureteral calculous obstruction: Secondary | ICD-10-CM | POA: Diagnosis not present

## 2016-09-21 DIAGNOSIS — L7634 Postprocedural seroma of skin and subcutaneous tissue following other procedure: Secondary | ICD-10-CM

## 2016-09-21 MED ORDER — IOPAMIDOL (ISOVUE-300) INJECTION 61%
100.0000 mL | Freq: Once | INTRAVENOUS | Status: AC | PRN
  Administered 2016-09-21: 100 mL via INTRAVENOUS

## 2016-09-22 ENCOUNTER — Ambulatory Visit (HOSPITAL_BASED_OUTPATIENT_CLINIC_OR_DEPARTMENT_OTHER): Payer: Medicare Other | Attending: Family Medicine | Admitting: Internal Medicine

## 2016-09-22 VITALS — Ht 64.0 in | Wt 218.0 lb

## 2016-09-22 DIAGNOSIS — G4730 Sleep apnea, unspecified: Secondary | ICD-10-CM

## 2016-09-22 DIAGNOSIS — R0683 Snoring: Secondary | ICD-10-CM | POA: Insufficient documentation

## 2016-09-22 DIAGNOSIS — E669 Obesity, unspecified: Secondary | ICD-10-CM | POA: Insufficient documentation

## 2016-09-22 DIAGNOSIS — Z6837 Body mass index (BMI) 37.0-37.9, adult: Secondary | ICD-10-CM | POA: Insufficient documentation

## 2016-09-22 DIAGNOSIS — R5383 Other fatigue: Secondary | ICD-10-CM | POA: Insufficient documentation

## 2016-09-22 DIAGNOSIS — I10 Essential (primary) hypertension: Secondary | ICD-10-CM | POA: Insufficient documentation

## 2016-09-24 DIAGNOSIS — F251 Schizoaffective disorder, depressive type: Secondary | ICD-10-CM | POA: Diagnosis not present

## 2016-09-27 DIAGNOSIS — R002 Palpitations: Secondary | ICD-10-CM | POA: Diagnosis not present

## 2016-09-30 ENCOUNTER — Other Ambulatory Visit: Payer: Self-pay

## 2016-10-03 DIAGNOSIS — R0683 Snoring: Secondary | ICD-10-CM

## 2016-10-03 NOTE — Procedures (Signed)
   Patient Name: [name] [name], [place] Date: 09/22/2016 Gender: Female D.O.B: May 22, 1971 Age (years): 45 Referring Provider: Maurice Small Height (inches): 16 Interpreting Physician: Baird Lyons MD, ABSM Weight (lbs): 218 RPSGT: Carolin Coy BMI: 37 MRN: [id number] Neck Size: 13.50 CLINICAL INFORMATION Sleep Study Type: NPSG Indication for sleep study: Daytime Fatigue, Depression, Fatigue, Hypertension, Obesity Epworth Sleepiness Score: 15  SLEEP STUDY TECHNIQUE As per the AASM Manual for the Scoring of Sleep and Associated Events v2.3 (April 2016) with a hypopnea requiring 4% desaturations. The channels recorded and monitored were frontal, central and occipital EEG, electrooculogram (EOG), submentalis EMG (chin), nasal and oral airflow, thoracic and abdominal wall motion, anterior tibialis EMG, snore microphone, electrocardiogram, and pulse oximetry.  MEDICATIONS Patient's medications include: charted for review Medications self-administered by patient during sleep study : VISTARIL, BUSPAR, AMLODIPINE.  SLEEP ARCHITECTURE The study was initiated at 9:47:26 PM and ended at 4:24:49 AM. Sleep onset time was 39.3 minutes and the sleep efficiency was 77.6%. The total sleep time was 308.5 minutes. Stage REM latency was 267.5 minutes. The patient spent 13.78% of the night in stage N1 sleep, 84.60% in stage N2 sleep, 0.00% in stage N3 and 1.62% in REM. Alpha intrusion was absent. Supine sleep was 47.99%.  RESPIRATORY PARAMETERS The overall apnea/hypopnea index (AHI) was 0.6 per hour. There were 3 total apneas, including 0 obstructive, 3 central and 0 mixed apneas. There were 0 hypopneas and 7 RERAs. The AHI during Stage REM sleep was 0.0 per hour. AHI while supine was 0.8 per hour. The mean oxygen saturation was 94.89%. The minimum SpO2 during sleep was 90.00%. Moderate snoring was noted during this study.  CARDIAC DATA The 2 lead EKG demonstrated sinus rhythm. The mean heart  rate was 63.69 beats per minute. Other EKG findings include: PACs.  LEG MOVEMENT DATA The total PLMS were 0 with a resulting PLMS index of 0.00. Associated arousal with leg movement index was 0.0 .  IMPRESSIONS - No significant obstructive sleep apnea occurred during this study (AHI = 0.6/h). - No significant central sleep apnea occurred during this study (CAI = 0.6/h). - The patient had minimal or no oxygen desaturation during the study (Min O2 = 90.00%) - The patient snored with Moderate snoring volume. - No significant cardiac abnormalities were noted during this study. - Clinically significant periodic limb movements did not occur during sleep. No significant associated arousals.  DIAGNOSIS - Primary Snoring (786.09 [R06.83 ICD-10]) - Normal study  RECOMMENDATIONS - Avoid alcohol, sedatives and other CNS depressants that may worsen sleep apnea and disrupt normal sleep architecture. - Sleep hygiene should be reviewed to assess factors that may improve sleep quality. - Weight management and regular exercise should be initiated or continued if appropriate.  [Electronically signed] 10/03/2016 08:03 PM  Baird Lyons MD, [place], American Board of Sleep Medicine   NPI: [id number]  [place], American Board of Sleep Medicine  ELECTRONICALLY SIGNED ON:  10/03/2016, 7:59 PM [place] PH: [phone]   FX: [phone] [place]

## 2016-10-06 ENCOUNTER — Emergency Department (HOSPITAL_COMMUNITY)
Admission: EM | Admit: 2016-10-06 | Discharge: 2016-10-06 | Disposition: A | Discharge status: Home / Self Care | Payer: Medicare Other | Attending: Emergency Medicine | Admitting: Emergency Medicine

## 2016-10-06 ENCOUNTER — Encounter (HOSPITAL_COMMUNITY): Payer: Self-pay | Admitting: *Deleted

## 2016-10-06 DIAGNOSIS — R51 Headache: Secondary | ICD-10-CM | POA: Diagnosis present

## 2016-10-06 DIAGNOSIS — R42 Dizziness and giddiness: Secondary | ICD-10-CM

## 2016-10-06 DIAGNOSIS — R202 Paresthesia of skin: Secondary | ICD-10-CM | POA: Insufficient documentation

## 2016-10-06 DIAGNOSIS — H539 Unspecified visual disturbance: Secondary | ICD-10-CM | POA: Diagnosis not present

## 2016-10-06 DIAGNOSIS — I10 Essential (primary) hypertension: Secondary | ICD-10-CM | POA: Insufficient documentation

## 2016-10-06 NOTE — ED Provider Notes (Signed)
[facility] DEPT Provider Note   CSN: [id number] Arrival date & time: 10/06/16  1029     History   Chief Complaint Chief Complaint  Patient presents with  . Numbness  . Headache    HPI [name] [name] is a 45 y.o. female presents with several complaints. PMH significant for paranoid schizophrenia, depression, anxiety, past hx of suicide attempts. She states that over the past 2 days she has intermittent blurry vision when she is trying to read. She has also had intermittent dizziness which is not worse with position changes or head movement. She reports an associated mild frontal headache (3/10) that she noticed this morning after she woke up. She also has had left hand numbness and tingling which started yesterday and is her main complaint. She does not work. Has not had this before. Denies fever, neck pain, LOC, lightheadedness, weakness. No chest pain, SOB, abdominal pain, N/V.  HPI  Past Medical History:  Diagnosis Date  . Anxiety   . Blood dyscrasia    pt not aware  . Depression   . Diarrhea, functional   . Diverticulitis   . Headache    migraines  . Heart murmur    asa child   . Hx pulmonary embolism 2013   after surgery from [place]  . Hyperlipidemia   . Hypertension    not on medication  . Iron deficiency anemia, unspecified 11/15/2013  . Kidney stone   . Paranoid schizophrenia ([place])   . Personality disorder   . Renal insufficiency     Patient Active Problem List   Diagnosis Date Noted  . Hypertension 03/30/2016  . Serotonin syndrome 03/30/2016  . Elevated blood pressure 03/30/2016  . Leukocytosis 03/30/2016  . Precordial chest pain 03/30/2016  . Incisional hernia 12/24/2015  . Drug overdose, intentional ([place])   . Hyperprolactinemia ([place]) 09/11/2015  . Schizoaffective disorder, bipolar type ([place]) 09/10/2015  . Hx of borderline personality disorder 09/10/2015  . PTSD (post-traumatic stress disorder) 09/10/2015  . Suicide attempt ([place]) 09/05/2015  .  Overdose, drug 09/05/2015  . Overdose 09/05/2015  . Drug overdose   . QT prolongation   . Weight gain 03/06/2015  . [place] 03/06/2015  . Iron deficiency anemia, unspecified 11/15/2013  . Liver mass, left lobe 05/28/2013  . Postop check 03/13/2013  . Postoperative wound infection 02/23/2013  . History of colostomy 02/16/2013  . Acute blood loss anemia 08/01/2012  . Renal calculus, left 03/31/2012  . Hydronephrosis, left 03/31/2012    Past Surgical History:  Procedure Laterality Date  . Breast Reduction Left 06/2014  . COLOSTOMY  07/31/2012   Procedure: COLOSTOMY;  Surgeon: Harl Bowie, MD;  Location: [facility];  Service: General;  Laterality: Right;  . COLOSTOMY CLOSURE N/A 02/15/2013   Procedure: COLOSTOMY CLOSURE;  Surgeon: Gwenyth Ober, MD;  Location: [facility];  Service: General;  Laterality: N/A;  Reversal of colostomy  . COLOSTOMY REVERSAL    . DILATATION & CURETTAGE/HYSTEROSCOPY WITH TRUECLEAR N/A 10/24/2013   Procedure: DILATATION & CURETTAGE/HYSTEROSCOPY WITH TRUECLEAR, CERVICAL BLOCK;  Surgeon: Marylynn Pearson, MD;  Location: [facility];  Service: Gynecology;  Laterality: N/A;  . [name]  2013   ABDOMINAL SURGERY  . INCISIONAL HERNIA REPAIR N/A 12/24/2015   Procedure:  INCISIONAL HERNIA REPAIR WITH MESH;  Surgeon: Coralie Keens, MD;  Location: [facility];  Service: General;  Laterality: N/A;  . INSERTION OF MESH N/A 12/24/2015   Procedure: INSERTION OF MESH;  Surgeon: Coralie Keens, MD;  Location: [facility];  Service: General;  Laterality: N/A;  . LAPAROTOMY  07/31/2012   Procedure: EXPLORATORY LAPAROTOMY;  Surgeon: Harl Bowie, MD;  Location: [facility];  Service: General;  Laterality: N/A;  REPAIR OF PANCREATIC INJURY, EXPLORATION OF RETROPERITONEUM.  [name] NEPHROLITHOTOMY  03/31/2012   Procedure: NEPHROLITHOTOMY PERCUTANEOUS;  Surgeon: Claybon Jabs, MD;  Location: [facility];  Service: Urology;  Laterality: Left;  . OTHER SURGICAL HISTORY     cyst removed from ovary ? side   . TUBAL  LIGATION      OB History    Gravida Para Term Preterm AB Living   3 2 2   1 2    SAB TAB Ectopic Multiple Live Births   1       2       Home Medications    Prior to Admission medications   Medication Sig Start Date End Date Taking? Authorizing Provider  amLODipine (NORVASC) 5 MG tablet Take 1 tablet (5 mg total) by mouth at bedtime. For high blood pressure 08/03/16   Encarnacion Slates, NP  carbamazepine (TEGRETOL) 100 MG chewable tablet Chew 1 tablet (100 mg total) by mouth 3 (three) times daily. For mood stabilization 08/03/16   Encarnacion Slates, NP  hydrOXYzine (ATARAX/VISTARIL) 25 MG tablet Take 1 tablet (25 mg total) by mouth 3 (three) times daily as needed for anxiety. 08/03/16   Encarnacion Slates, NP  senna-docusate (SENOKOT-S) 8.6-50 MG tablet Take 1 tablet by mouth every morning. (This medicine can be purchased from over the counter at the pharmacy): For constipation 08/03/16   Encarnacion Slates, NP  simvastatin (ZOCOR) 20 MG tablet Take 1 tablet (20 mg total) by mouth daily at 6 PM. For high cholesterol 08/03/16   Encarnacion Slates, NP  zolpidem (AMBIEN) 5 MG tablet Take 1 tablet (5 mg total) by mouth at bedtime. For sleep 08/03/16   Encarnacion Slates, NP    Family History Family History  Problem Relation Age of Onset  . Cancer Mother     kidney  . Depression Mother   . Other Neg Hx     adrenal problem    Social History Social History  Substance Use Topics  . Smoking status: Never Smoker  . Smokeless tobacco: Never Used  . Alcohol use No     Allergies   Augmentin [amoxicillin-pot clavulanate]; Enoxaparin; Avelox [moxifloxacin hcl in nacl]; Sodium hydroxide; and Penicillins   Review of Systems Review of Systems  Constitutional: Negative for fever.  Respiratory: Negative for shortness of breath.   Cardiovascular: Negative for chest pain.  Gastrointestinal: Negative for abdominal pain, nausea and vomiting.  Neurological: Positive for dizziness, numbness and headaches. Negative for syncope,  weakness and light-headedness.  All other systems reviewed and are negative.    Physical Exam Updated Vital Signs BP 119/86 (BP Location: Right Arm)   Pulse 86   Temp 98.9 F (37.2 C) (Oral)   Resp 17   LMP 09/19/2016 (Exact Date)   SpO2 98%   Physical Exam  Constitutional: She is oriented to person, place, and time. She appears well-developed and well-nourished. No distress.  HENT:  Head: Normocephalic and atraumatic.  Eyes: Conjunctivae are normal. Pupils are equal, round, and reactive to light. Right eye exhibits no discharge. Left eye exhibits no discharge. No scleral icterus.  Neck: Normal range of motion. Neck supple.  Cardiovascular: Normal rate and regular rhythm.  Exam reveals no gallop and no friction rub.   No murmur heard. Pulmonary/Chest: Effort normal and breath sounds normal. No  respiratory distress. She has no wheezes. She has no rales. She exhibits no tenderness.  Abdominal: Soft. She exhibits no distension. There is no tenderness.  Musculoskeletal: She exhibits no edema.  Neurological: She is alert and oriented to person, place, and time.  Mental Status:  Alert, oriented, thought content appropriate, able to give a coherent history. Speech fluent without evidence of aphasia. Able to follow 2 step commands without difficulty.  Cranial Nerves:  II:  Peripheral visual fields grossly normal, pupils equal, round, reactive to light III,IV, VI: ptosis not present, extra-ocular motions intact bilaterally  V,VII: smile symmetric, facial light touch sensation equal VIII: hearing grossly normal to voice  X: uvula elevates symmetrically  XI: bilateral shoulder shrug symmetric and strong XII: midline tongue extension without fassiculations Motor:  Normal tone. 5/5 in upper and lower extremities bilaterally including strong and equal grip strength and dorsiflexion/plantar flexion Sensory: Reported numbness tingling in left hand and fingers, mostly in pinky, ring, and middle  finger. -Tinel and Phalen however symptoms are worsened with compression of the wrist Deep Tendon Reflexes: 2+ and symmetric in the biceps and patella Cerebellar: normal finger-to-nose with bilateral upper extremities Gait: normal gait and balance CV: distal pulses palpable throughout    Skin: Skin is warm and dry.  Psychiatric: She has a normal mood and affect. Her behavior is normal.  Nursing note and vitals reviewed.    ED Treatments / Results  Labs (all labs ordered are listed, but only abnormal results are displayed) Labs Reviewed - No data to display  EKG  EKG Interpretation None       Radiology No results found.  Procedures Procedures (including critical care time)  Medications Ordered in ED Medications - No data to display   Initial Impression / Assessment and Plan / ED Course  I have reviewed the triage vital signs and the nursing notes.  Pertinent labs & imaging results that were available during my care of the patient were reviewed by me and considered in my medical decision making (see chart for details).  Clinical Course   45 year old female presents with symptoms consistent with possibly ulnar nerve paresthesia. Patient is afebrile, not tachycardic or tachypneic, normotensive, and not hypoxic. Neuro exam is unremarkable. Visual acuity is normal. Velcro wrist splint applied. NSAIDs recommended. Discussed with patient that we could draw labs and perform imaging. Patient declined and would like to follow up as outpatient and return if symptoms are worsening.   Final Clinical Impressions(s) / ED Diagnoses   Final diagnoses:  Left hand paresthesia  Dizziness  Visual disturbance    New Prescriptions New Prescriptions   No medications on file     Recardo Evangelist, PA-C 10/07/16 [name], MD 10/07/16 [phone]

## 2016-10-06 NOTE — ED Notes (Signed)
Patient informed of delay and aware no treatment rooms currently available. Patient in no distress and alert and oriented

## 2016-10-06 NOTE — ED Triage Notes (Signed)
Pt reports left hand numbness since last night. Reports headache today but having blurred vision x 2 days. No neuro deficits noted at triage.

## 2016-10-06 NOTE — Progress Notes (Signed)
Orthopedic Tech Progress Note Patient Details:  [name] [name] November 02, 1971 [id number]  Ortho Devices Type of Ortho Device: Velcro wrist splint Ortho Device/Splint Location: lue Ortho Device/Splint Interventions: Application   Alexius Ellington 10/06/2016, 2:00 PM

## 2016-10-08 DIAGNOSIS — G5620 Lesion of ulnar nerve, unspecified upper limb: Secondary | ICD-10-CM | POA: Diagnosis not present

## 2016-10-08 DIAGNOSIS — Z23 Encounter for immunization: Secondary | ICD-10-CM | POA: Diagnosis not present

## 2016-10-11 DIAGNOSIS — E669 Obesity, unspecified: Secondary | ICD-10-CM | POA: Diagnosis not present

## 2016-10-11 DIAGNOSIS — Z6838 Body mass index (BMI) 38.0-38.9, adult: Secondary | ICD-10-CM | POA: Diagnosis not present

## 2016-10-13 ENCOUNTER — Other Ambulatory Visit (HOSPITAL_COMMUNITY): Payer: Self-pay | Admitting: General Surgery

## 2016-10-13 DIAGNOSIS — L7634 Postprocedural seroma of skin and subcutaneous tissue following other procedure: Secondary | ICD-10-CM | POA: Diagnosis not present

## 2016-10-20 DIAGNOSIS — Z7984 Long term (current) use of oral hypoglycemic drugs: Secondary | ICD-10-CM | POA: Diagnosis not present

## 2016-10-20 DIAGNOSIS — R7301 Impaired fasting glucose: Secondary | ICD-10-CM | POA: Diagnosis not present

## 2016-10-20 DIAGNOSIS — E119 Type 2 diabetes mellitus without complications: Secondary | ICD-10-CM | POA: Diagnosis not present

## 2016-10-20 DIAGNOSIS — R51 Headache: Secondary | ICD-10-CM | POA: Diagnosis not present

## 2016-10-20 DIAGNOSIS — R5383 Other fatigue: Secondary | ICD-10-CM | POA: Diagnosis not present

## 2016-10-22 ENCOUNTER — Other Ambulatory Visit: Payer: Self-pay

## 2016-10-22 ENCOUNTER — Ambulatory Visit (HOSPITAL_COMMUNITY)
Admission: RE | Admit: 2016-10-22 | Discharge: 2016-10-22 | Disposition: A | Discharge status: Home / Self Care | Payer: Medicare Other | Source: Ambulatory Visit | Attending: General Surgery | Admitting: General Surgery

## 2016-10-22 DIAGNOSIS — Z01810 Encounter for preprocedural cardiovascular examination: Secondary | ICD-10-CM | POA: Insufficient documentation

## 2016-10-22 DIAGNOSIS — Z7984 Long term (current) use of oral hypoglycemic drugs: Secondary | ICD-10-CM | POA: Diagnosis not present

## 2016-10-22 DIAGNOSIS — E119 Type 2 diabetes mellitus without complications: Secondary | ICD-10-CM | POA: Diagnosis not present

## 2016-10-22 DIAGNOSIS — Z01818 Encounter for other preprocedural examination: Secondary | ICD-10-CM | POA: Diagnosis not present

## 2016-10-22 DIAGNOSIS — H52223 Regular astigmatism, bilateral: Secondary | ICD-10-CM | POA: Diagnosis not present

## 2016-10-22 DIAGNOSIS — H524 Presbyopia: Secondary | ICD-10-CM | POA: Diagnosis not present

## 2016-10-22 DIAGNOSIS — H04123 Dry eye syndrome of bilateral lacrimal glands: Secondary | ICD-10-CM | POA: Diagnosis not present

## 2016-10-22 DIAGNOSIS — H5213 Myopia, bilateral: Secondary | ICD-10-CM | POA: Diagnosis not present

## 2016-10-25 ENCOUNTER — Other Ambulatory Visit: Payer: Self-pay | Admitting: Surgery

## 2016-10-25 DIAGNOSIS — R51 Headache: Secondary | ICD-10-CM | POA: Diagnosis not present

## 2016-10-27 DIAGNOSIS — R51 Headache: Secondary | ICD-10-CM | POA: Diagnosis not present

## 2016-10-28 ENCOUNTER — Encounter: Payer: Medicare Other | Attending: General Surgery | Admitting: Dietician

## 2016-10-28 DIAGNOSIS — Z713 Dietary counseling and surveillance: Secondary | ICD-10-CM | POA: Diagnosis not present

## 2016-10-28 DIAGNOSIS — Z888 Allergy status to other drugs, medicaments and biological substances status: Secondary | ICD-10-CM | POA: Diagnosis not present

## 2016-10-28 DIAGNOSIS — Z79899 Other long term (current) drug therapy: Secondary | ICD-10-CM | POA: Insufficient documentation

## 2016-10-28 DIAGNOSIS — Z6838 Body mass index (BMI) 38.0-38.9, adult: Secondary | ICD-10-CM

## 2016-10-28 DIAGNOSIS — E669 Obesity, unspecified: Secondary | ICD-10-CM

## 2016-10-28 NOTE — Progress Notes (Signed)
  Pre-Op Assessment Visit:  Pre-Operative Sleeve Gastrectomy Surgery  Medical Nutrition Therapy:  Appt start time: [id number]   End time:  1050.  Patient was seen on 10/28/2016 for Pre-Operative Nutrition Assessment. Assessment and letter of approval faxed to [facility] Surgery Bariatric Surgery Program coordinator on 10/28/2016.   Patient has an extensive psychiatric history but denies current feelings of depression or suicidal thoughts. Verbalized understanding of Pre Op goals and has begun to cut back on sweet drinks. States that she is highly motivated to have bariatric surgery.   Preferred Learning Style:   No preference indicated   Learning Readiness:   Ready  Handouts given during visit include:  Pre-Op Goals Bariatric Surgery Protein Shakes   During the appointment today the following Pre-Op Goals were reviewed with the patient: Maintain or lose weight as instructed by your surgeon Make healthy food choices Begin to limit portion sizes Limited concentrated sugars and fried foods Keep fat/sugar in the single digits per serving on   food labels Practice CHEWING your food  (aim for 30 chews per bite or until applesauce consistency) Practice not drinking 15 minutes before, during, and 30 minutes after each meal/snack Avoid all carbonated beverages  Avoid/limit caffeinated beverages  Avoid all sugar-sweetened beverages Consume 3 meals per day; eat every 3-5 hours Make a list of non-food related activities Aim for 64-100 ounces of FLUID daily  Aim for at least 60-80 grams of PROTEIN daily Look for a liquid protein source that contain ?15 g protein and ?5 g carbohydrate  (ex: shakes, drinks, shots)  Patient-Centered Goals: Would like to be more active with her daughter (walk more), eat healthier/make better choices, improve diabetes   9-10 confidence in doing Pre-Op goals  Demonstrated degree of understanding via:  Teach Back  Teaching Method Utilized:   Visual Auditory Hands on  Barriers to learning/adherence to lifestyle change: hx of major depression  Patient to call the [facility] to enroll in Pre-Op and Post-Op Nutrition Education when surgery date is scheduled.

## 2016-10-28 NOTE — Patient Instructions (Signed)
Follow Pre-Op Goals Try Protein Shakes Call [facility] at [phone] when surgery is scheduled to enroll in Pre-Op Class  Things to remember:  Please always be honest with us. We want to support you!  If you have any questions or concerns in between appointments, please call or email Liz, Leslie, or Laurie.  The diet after surgery will be high protein and low in carbohydrate.  Vitamins and calcium need to be taken for the rest of your life.  Feel free to include support people in any classes or appointments.   Supplement recommendations:  Before Surgery   1 Complete Multivitamin with Iron  3000 IU Vitamin D3  After Surgery   2 Chewable Multivitamins  **Best Choice - Bariatric Advantage Advanced Multi EA      3 Chewable Calcium (500 mg each, total 1200-1500 mg per day)  **Best Choice - Celebrate, Bariatric Advantage, or Wellesse  Other Options:    2 Flinstones Complete + up to 100 mg Thiamin + 2000-3000 IU Vitamin D3 + 350-500 mcg Vitamin B12 + 30-45 mg Iron (with history of deficiency)  2 Celebrate MultiComplete with 18 mg Iron (this provides 6000 IU of  Vitamin D3)  4 Celebrate Essential Multi 2 in 1 (has calcium) + 18-60 mg separate  iron  Vitamins and Calcium are available at:   [facility]   [address], [place], [place] [address]   [email or web address]  [email or web address]  www.[email or web address]   

## 2016-11-01 DIAGNOSIS — N23 Unspecified renal colic: Secondary | ICD-10-CM | POA: Diagnosis not present

## 2016-11-02 ENCOUNTER — Emergency Department (HOSPITAL_COMMUNITY): Payer: Medicare Other

## 2016-11-02 ENCOUNTER — Emergency Department (HOSPITAL_COMMUNITY)
Admission: EM | Admit: 2016-11-02 | Discharge: 2016-11-02 | Disposition: A | Discharge status: Home / Self Care | Payer: Medicare Other | Attending: Emergency Medicine | Admitting: Emergency Medicine

## 2016-11-02 ENCOUNTER — Encounter (HOSPITAL_COMMUNITY): Payer: Self-pay | Admitting: Emergency Medicine

## 2016-11-02 DIAGNOSIS — Z79899 Other long term (current) drug therapy: Secondary | ICD-10-CM | POA: Diagnosis not present

## 2016-11-02 DIAGNOSIS — N200 Calculus of kidney: Secondary | ICD-10-CM | POA: Diagnosis not present

## 2016-11-02 DIAGNOSIS — R1084 Generalized abdominal pain: Secondary | ICD-10-CM | POA: Diagnosis not present

## 2016-11-02 DIAGNOSIS — I10 Essential (primary) hypertension: Secondary | ICD-10-CM | POA: Diagnosis not present

## 2016-11-02 DIAGNOSIS — Z7984 Long term (current) use of oral hypoglycemic drugs: Secondary | ICD-10-CM | POA: Diagnosis not present

## 2016-11-02 DIAGNOSIS — R1030 Lower abdominal pain, unspecified: Secondary | ICD-10-CM | POA: Diagnosis not present

## 2016-11-02 DIAGNOSIS — R109 Unspecified abdominal pain: Secondary | ICD-10-CM | POA: Insufficient documentation

## 2016-11-02 LAB — URINALYSIS, ROUTINE W REFLEX MICROSCOPIC
BILIRUBIN URINE: NEGATIVE
Glucose, UA: NEGATIVE mg/dL
KETONES UR: NEGATIVE mg/dL
Leukocytes, UA: NEGATIVE
NITRITE: NEGATIVE
PH: 6.5 (ref 5.0–8.0)
Protein, ur: NEGATIVE mg/dL
SPECIFIC GRAVITY, URINE: 1.015 (ref 1.005–1.030)

## 2016-11-02 LAB — URINE MICROSCOPIC-ADD ON

## 2016-11-02 LAB — BASIC METABOLIC PANEL
ANION GAP: 9 (ref 5–15)
BUN: 13 mg/dL (ref 6–20)
CALCIUM: 9.4 mg/dL (ref 8.9–10.3)
CO2: 25 mmol/L (ref 22–32)
CREATININE: 0.99 mg/dL (ref 0.44–1.00)
Chloride: 103 mmol/L (ref 101–111)
GFR calc Af Amer: >60 mL/min (ref >60)
GFR calc non Af Amer: >60 mL/min (ref >60)
GLUCOSE: 139 mg/dL — AB (ref 65–99)
Potassium: 4.3 mmol/L (ref 3.5–5.1)
Sodium: 137 mmol/L (ref 135–145)

## 2016-11-02 LAB — CBC
HCT: 39 % (ref 36.0–46.0)
HEMOGLOBIN: 12.6 g/dL (ref 12.0–15.0)
MCH: 27.7 pg (ref 26.0–34.0)
MCHC: 32.3 g/dL (ref 30.0–36.0)
MCV: 85.7 fL (ref 78.0–100.0)
Platelets: 355 10*3/uL (ref 150–400)
RBC: 4.55 MIL/uL (ref 3.87–5.11)
RDW: 13.8 % (ref 11.5–15.5)
WBC: 8.8 10*3/uL (ref 4.0–10.5)

## 2016-11-02 LAB — I-STAT BETA HCG BLOOD, ED (MC, WL, AP ONLY)

## 2016-11-02 LAB — I-STAT TROPONIN, ED: TROPONIN I, POC: 0.01 ng/mL (ref 0.00–0.08)

## 2016-11-02 MED ORDER — SODIUM CHLORIDE 0.9 % IV BOLUS (SEPSIS)
1000.0000 mL | Freq: Once | INTRAVENOUS | Status: AC
Start: 2016-11-02 — End: 2016-11-02
  Administered 2016-11-02: 1000 mL via INTRAVENOUS

## 2016-11-02 MED ORDER — MORPHINE SULFATE (PF) 4 MG/ML IV SOLN
4.0000 mg | Freq: Once | INTRAVENOUS | Status: AC
  Administered 2016-11-02: 4 mg via INTRAVENOUS
  Filled 2016-11-02: qty 1

## 2016-11-02 MED ORDER — HYDROCODONE-ACETAMINOPHEN 5-325 MG PO TABS: 1.0000 | ORAL_TABLET | ORAL | 0 refills | Status: DC | PRN

## 2016-11-02 NOTE — ED Triage Notes (Signed)
Pt c/o R flank pain, that's traveling down to her flank area. Pt states shes had kidney stones before. Describes it as burning. Pt also states shes now having chest pain. Will do EKG in triage. Pt also endorses vomiting. Pt in NAD.

## 2016-11-02 NOTE — ED Provider Notes (Signed)
[facility] DEPT Provider Note   CSN: [id number] Arrival date & time: 11/02/16  1058     History   Chief Complaint Chief Complaint  Patient presents with  . Flank Pain    HPI [name] [name] is a 45 y.o. female.  Pt presents to the ED today with right sided flank pain and urinary burning.  She has been vomiting as well and now has cp.  The pt said that she has had kidney stones in the past and this is a little different.      Past Medical History:  Diagnosis Date  . Anxiety   . Blood dyscrasia    pt not aware  . Depression   . Diarrhea, functional   . Diverticulitis   . Headache    migraines  . Heart murmur    asa child   . Hx pulmonary embolism 2013   after surgery from [place]  . Hyperlipidemia   . Hypertension    not on medication  . Iron deficiency anemia, unspecified 11/15/2013  . Kidney stone   . Paranoid schizophrenia ([place])   . Personality disorder   . Renal insufficiency     Patient Active Problem List   Diagnosis Date Noted  . Hypertension 03/30/2016  . Serotonin syndrome 03/30/2016  . Elevated blood pressure 03/30/2016  . Leukocytosis 03/30/2016  . Precordial chest pain 03/30/2016  . Incisional hernia 12/24/2015  . Drug overdose, intentional ([place])   . Hyperprolactinemia ([place]) 09/11/2015  . Schizoaffective disorder, bipolar type ([place]) 09/10/2015  . Hx of borderline personality disorder 09/10/2015  . PTSD (post-traumatic stress disorder) 09/10/2015  . Suicide attempt ([place]) 09/05/2015  . Overdose, drug 09/05/2015  . Overdose 09/05/2015  . Drug overdose   . QT prolongation   . Weight gain 03/06/2015  . [place] 03/06/2015  . Iron deficiency anemia, unspecified 11/15/2013  . Liver mass, left lobe 05/28/2013  . Postop check 03/13/2013  . Postoperative wound infection 02/23/2013  . History of colostomy 02/16/2013  . Acute blood loss anemia 08/01/2012  . Renal calculus, left 03/31/2012  . Hydronephrosis, left 03/31/2012    Past Surgical  History:  Procedure Laterality Date  . Breast Reduction Left 06/2014  . COLOSTOMY  07/31/2012   Procedure: COLOSTOMY;  Surgeon: Harl Bowie, MD;  Location: [facility];  Service: General;  Laterality: Right;  . COLOSTOMY CLOSURE N/A 02/15/2013   Procedure: COLOSTOMY CLOSURE;  Surgeon: Gwenyth Ober, MD;  Location: [facility];  Service: General;  Laterality: N/A;  Reversal of colostomy  . COLOSTOMY REVERSAL    . DILATATION & CURETTAGE/HYSTEROSCOPY WITH TRUECLEAR N/A 10/24/2013   Procedure: DILATATION & CURETTAGE/HYSTEROSCOPY WITH TRUECLEAR, CERVICAL BLOCK;  Surgeon: Marylynn Pearson, MD;  Location: [facility];  Service: Gynecology;  Laterality: N/A;  . [place]  2013   ABDOMINAL SURGERY  . INCISIONAL HERNIA REPAIR N/A 12/24/2015   Procedure:  INCISIONAL HERNIA REPAIR WITH MESH;  Surgeon: Coralie Keens, MD;  Location: [facility];  Service: General;  Laterality: N/A;  . INSERTION OF MESH N/A 12/24/2015   Procedure: INSERTION OF MESH;  Surgeon: Coralie Keens, MD;  Location: [facility];  Service: General;  Laterality: N/A;  . LAPAROTOMY  07/31/2012   Procedure: EXPLORATORY LAPAROTOMY;  Surgeon: Harl Bowie, MD;  Location: [facility];  Service: General;  Laterality: N/A;  REPAIR OF PANCREATIC INJURY, EXPLORATION OF RETROPERITONEUM.  [name] NEPHROLITHOTOMY  03/31/2012   Procedure: NEPHROLITHOTOMY PERCUTANEOUS;  Surgeon: Claybon Jabs, MD;  Location: [facility];  Service: Urology;  Laterality: Left;  .  OTHER SURGICAL HISTORY     cyst removed from ovary ? side   . TUBAL LIGATION      OB History    Gravida Para Term Preterm AB Living   3 2 2   1 2    SAB TAB Ectopic Multiple Live Births   1       2       Home Medications    Prior to Admission medications   Medication Sig Start Date End Date Taking? Authorizing Provider  amLODipine (NORVASC) 5 MG tablet Take 1 tablet (5 mg total) by mouth at bedtime. For high blood pressure 08/03/16  Yes Encarnacion Slates, NP  busPIRone (BUSPAR) 10 MG tablet Take 10 mg by mouth 3 (three)  times daily.   Yes Historical Provider, MD  citalopram (CELEXA) 20 MG tablet Take 20 mg by mouth daily.   Yes Historical Provider, MD  hydrOXYzine (ATARAX/VISTARIL) 25 MG tablet Take 1 tablet (25 mg total) by mouth 3 (three) times daily as needed for anxiety. Patient taking differently: Take 50 mg by mouth 4 (four) times daily.  08/03/16  Yes Encarnacion Slates, NP  meloxicam (MOBIC) 15 MG tablet Take 15 mg by mouth daily as needed for pain. 09/20/16  Yes Historical Provider, MD  metFORMIN (GLUCOPHAGE-XR) 500 MG 24 hr tablet Take 500 mg by mouth 2 (two) times daily. 10/21/16  Yes Historical Provider, MD  metoprolol tartrate (LOPRESSOR) 25 MG tablet Take 25 mg by mouth 2 (two) times daily.   Yes Historical Provider, MD  omeprazole (PRILOSEC) 20 MG capsule Take 40 mg by mouth daily.   Yes Historical Provider, MD  HYDROcodone-acetaminophen (NORCO/VICODIN) 5-325 MG tablet Take 1 tablet by mouth every 4 (four) hours as needed. 11/02/16   Isla Pence, MD    Family History Family History  Problem Relation Age of Onset  . Cancer Mother     kidney  . Depression Mother   . Other Neg Hx     adrenal problem    Social History Social History  Substance Use Topics  . Smoking status: Never Smoker  . Smokeless tobacco: Never Used  . Alcohol use No     Allergies   Augmentin [amoxicillin-pot clavulanate]; Enoxaparin; Avelox [moxifloxacin hcl in nacl]; Sodium hydroxide; and Penicillins   Review of Systems Review of Systems  Cardiovascular: Positive for chest pain.  Genitourinary: Positive for dysuria and flank pain.  All other systems reviewed and are negative.    Physical Exam Updated Vital Signs BP 111/72   Pulse 77   Temp 97.9 F (36.6 C) (Oral)   Resp 16   LMP 10/17/2016 (Exact Date)   SpO2 96%   Physical Exam  Constitutional: She is oriented to person, place, and time. She appears well-developed and well-nourished.  HENT:  Head: Normocephalic and atraumatic.  Right Ear: External  ear normal.  Left Ear: External ear normal.  Nose: Nose normal.  Mouth/Throat: Oropharynx is clear and moist.  Eyes: Conjunctivae and EOM are normal. Pupils are equal, round, and reactive to light.  Neck: Normal range of motion. Neck supple.  Cardiovascular: Normal rate, regular rhythm, normal heart sounds and intact distal pulses.   Pulmonary/Chest: Effort normal and breath sounds normal.  Abdominal: Soft. Bowel sounds are normal. There is tenderness in the suprapubic area. There is CVA tenderness.  Musculoskeletal: Normal range of motion.  Neurological: She is alert and oriented to person, place, and time.  Skin: Skin is warm.  Psychiatric: She has a normal mood and  affect. Her behavior is normal. Judgment and thought content normal.  Nursing note and vitals reviewed.    ED Treatments / Results  Labs (all labs ordered are listed, but only abnormal results are displayed) Labs Reviewed  URINALYSIS, ROUTINE W REFLEX MICROSCOPIC (NOT AT [facility]) - Abnormal; Notable for the following:       Result Value   APPearance CLOUDY (*)    Hgb urine dipstick TRACE (*)    All other components within normal limits  BASIC METABOLIC PANEL - Abnormal; Notable for the following:    Glucose, Bld 139 (*)    All other components within normal limits  URINE MICROSCOPIC-ADD ON - Abnormal; Notable for the following:    Squamous Epithelial / LPF 6-30 (*)    Bacteria, UA FEW (*)    All other components within normal limits  CBC  I-STAT BETA HCG BLOOD, ED ([facility], [facility], [facility] ONLY)  I-STAT TROPOININ, ED    EKG  EKG Interpretation  Date/Time:  Tuesday November 02 2016 11:22:13 EST Ventricular Rate:  97 PR Interval:  158 QRS Duration: 76 QT Interval:  374 QTC Calculation: 474 R Axis:   78 Text Interpretation:  Sinus rhythm with marked sinus arrhythmia Nonspecific ST abnormality Abnormal ECG Confirmed by Rambo Sarafian MD, Ledonna Dormer ([id number]) on 11/02/2016 11:50:18 AM       Radiology Ct Renal Stone Study  Result  Date: 11/02/2016 CLINICAL DATA:  Low back pain, flank pain, nausea and vomiting starting yesterday EXAM: CT ABDOMEN AND PELVIS WITHOUT CONTRAST TECHNIQUE: Multidetector CT imaging of the abdomen and pelvis was performed following the standard protocol without IV contrast. COMPARISON:  09/21/2016 FINDINGS: Lower chest: Lung bases are unremarkable. Hepatobiliary: Unenhanced liver shows no biliary ductal dilatation. No calcified gallstones are noted within gallbladder. Pancreas: Unenhanced pancreas is unremarkable. Spleen: Unenhanced spleen is unremarkable. Adrenals/Urinary Tract: No adrenal gland mass. Again noted left renal atrophy. Mild left hydronephrosis and left hydroureter again noted. No obstructing ureteral calculi are noted. Stable left nonobstructive nephrolithiasis. Renal cysts bilaterally are again noted. No calcified calculi are noted within urinary bladder. Punctate nonobstructive calculus right kidney lower pole measures 2 mm. Stomach/Bowel: No small bowel obstruction. Postsurgical changes are noted in right lower quadrant. Again noted subcutaneous scarring anterior abdominal wall. Small seroma anterior abdominal wall is stable. No evidence of colitis or diverticulitis. Distal colon diverticula. No distal colonic obstruction. Vascular/Lymphatic: No retroperitoneal or mesenteric adenopathy. No aortic aneurysm. Reproductive: The uterus is anteflexed. Post tubal ligation surgical clips are noted. There is a cyst/follicle within left ovary measures 2 cm. Other: No ascites or free abdominal air. A right inguinal lymph node measures 1 cm short-axis. Left inguinal lymph node measures 9 mm short-axis. These are borderline by size criteria probable reactive. Musculoskeletal: No destructive bony lesions are noted. Sagittal images of the spine shows mild degenerative changes lower thoracic spine. Probable hemangioma T11 vertebral body. IMPRESSION: 1. Again noted bilateral nonobstructive nephrolithiasis. Stable  chronic left hydronephrosis and mild hydroureter. Left renal atrophy stable. 2. No definite ureteral calculi. 3. Stable postsurgical changes anterior abdominal wall. 4. There is a left ovarian cyst/follicle measures 2 cm. Post tubal ligation surgical clips are noted no small bowel obstruction. No evidence of acute colitis or diverticulitis. 5. Borderline probable reactive bilateral inguinal lymph nodes. Electronically Signed   By: Lahoma Crocker M.D.   On: 11/02/2016 13:44    Procedures Procedures (including critical care time)  Medications Ordered in ED Medications  sodium chloride 0.9 % bolus 1,000 mL (1,000 mLs Intravenous New  Bag/Given 11/02/16 1159)  morphine 4 MG/ML injection 4 mg (4 mg Intravenous Given 11/02/16 1159)     Initial Impression / Assessment and Plan / ED Course  I have reviewed the triage vital signs and the nursing notes.  Pertinent labs & imaging results that were available during my care of the patient were reviewed by me and considered in my medical decision making (see chart for details).  Clinical Course    Pt is feeling much better.  She does not have an UTI or a ureteral stone.  Pt ok for d/c.  She knows to return if worse and to f/u with pcp.  Final Clinical Impressions(s) / ED Diagnoses   Final diagnoses:  Flank pain    New Prescriptions New Prescriptions   HYDROCODONE-ACETAMINOPHEN (NORCO/VICODIN) 5-325 MG TABLET    Take 1 tablet by mouth every 4 (four) hours as needed.     Isla Pence, MD 11/02/16 1355

## 2016-11-04 ENCOUNTER — Other Ambulatory Visit: Payer: Self-pay | Admitting: *Deleted

## 2016-11-04 NOTE — Patient Outreach (Signed)
[facility]) Care Management  11/04/2016  [name] [name] February 28, 1971 [id number]  Referral via [facility] ED list:  Telephone call to patient who was advised of reason for call & [facility] services.  Consents to complete health assessments at another time.  Plan: Appointment set for next week with agreement from patient.  Sherrin Daisy, RN BSN [place] Management Coordinator [facility] Care Management  [phone]

## 2016-11-08 DIAGNOSIS — F29 Unspecified psychosis not due to a substance or known physiological condition: Secondary | ICD-10-CM | POA: Diagnosis not present

## 2016-11-09 ENCOUNTER — Other Ambulatory Visit: Payer: Self-pay | Admitting: *Deleted

## 2016-11-09 NOTE — Patient Outreach (Signed)
[facility] [facility]) Care Management  11/09/2016  [name] [name] 1971-10-08 [id number]  Telephone call to patient; left message on voice mail requesting call back.  Plan: Will follow up   Sherrin Daisy, RN BSN [place] Management Coordinator [facility] Care Management  [phone]

## 2016-11-10 ENCOUNTER — Encounter: Payer: Self-pay | Admitting: *Deleted

## 2016-11-10 ENCOUNTER — Other Ambulatory Visit: Payer: Self-pay | Admitting: *Deleted

## 2016-11-10 DIAGNOSIS — F251 Schizoaffective disorder, depressive type: Secondary | ICD-10-CM | POA: Diagnosis not present

## 2016-11-10 NOTE — Patient Outreach (Signed)
[facility]) Care Management  11/10/2016  [name] [name] [birth year] [id number]  Telephone call attempt; left message on voice mail requesting call back.  Plan: Send outreach letter to patient. Will follow up.  Sherrin Daisy, RN BSN [place] Management Coordinator [facility] Care Management  [phone]

## 2016-11-15 ENCOUNTER — Other Ambulatory Visit: Payer: Self-pay | Admitting: *Deleted

## 2016-11-15 NOTE — Patient Outreach (Signed)
[facility] [facility]) Care Management  11/15/2016  ALEEYA DEMBY 09/11/1971 [id number]  Telephone call x 3 to patient; left message on voice mail.   Plan: Outreach letter sent; follow up in 10 business days.  Sherrin Daisy, RN BSN [place] Management Coordinator [facility] Care Management  [phone]

## 2016-11-16 ENCOUNTER — Ambulatory Visit: Payer: Medicare Other | Admitting: Psychiatry

## 2016-11-17 DIAGNOSIS — F29 Unspecified psychosis not due to a substance or known physiological condition: Secondary | ICD-10-CM | POA: Diagnosis not present

## 2016-11-26 DIAGNOSIS — F29 Unspecified psychosis not due to a substance or known physiological condition: Secondary | ICD-10-CM | POA: Diagnosis not present

## 2016-11-29 ENCOUNTER — Encounter: Payer: Medicare Other | Attending: General Surgery | Admitting: Dietician

## 2016-11-29 ENCOUNTER — Encounter: Payer: Self-pay | Admitting: Dietician

## 2016-11-29 DIAGNOSIS — Z713 Dietary counseling and surveillance: Secondary | ICD-10-CM | POA: Insufficient documentation

## 2016-11-29 DIAGNOSIS — Z6839 Body mass index (BMI) 39.0-39.9, adult: Secondary | ICD-10-CM | POA: Insufficient documentation

## 2016-11-29 DIAGNOSIS — Z888 Allergy status to other drugs, medicaments and biological substances status: Secondary | ICD-10-CM | POA: Insufficient documentation

## 2016-11-29 DIAGNOSIS — Z79899 Other long term (current) drug therapy: Secondary | ICD-10-CM | POA: Diagnosis not present

## 2016-11-29 NOTE — Progress Notes (Addendum)
Supervised Weight Loss Class:  Appt start time: 1600   End time:  1630.  Patient was seen on 11/29/2016 for the "Protein" Supervised Weight Loss Class [facility].   Surgery type: Sleeve gastrectomy Start weight at [facility]: 223 lbs on 10/28/2016 Weight today: 230.3 lbs Weight change: 7 lbs gain   Usual physical activity: did not assess today Encouraged to engage in 150 minutes of moderate physical activity including cardiovascular and weight baring weekly.  Diet to Follow: 1400-1600 calories 158-170 g carbohydrates 105-112 g protein 39-42 g fat  The following learning objectives were met by the patient during this class:  Objectives: -Discuss importance of protein after surgery  -Promotes healing  -Helps preserve lean mass (muscles and organs) as you lose fat -Identify foods that contain protein -Discuss recommended daily protein goals for men and women after surgery -Educate patients on body composition scale and purpose   Goals: -Begin to be mindful of how much protein is in the foods you are eating -Know how much protein is recommended daily after surgery -Begin having a protein food with each meal and snack  Handouts given: "Protein" brochure

## 2016-12-01 ENCOUNTER — Other Ambulatory Visit: Payer: Self-pay | Admitting: *Deleted

## 2016-12-01 ENCOUNTER — Encounter: Payer: Self-pay | Admitting: *Deleted

## 2016-12-01 NOTE — Patient Outreach (Signed)
[place] [facility]) Care Management  12/01/2016  [name] 1971/10/20 [id number]  No response to outreach letter and call attempts.  Plan: Close case. Send MD closure letter.   Sherrin Daisy, RN BSN [name] Management Coordinator [facility] Care Management  [phone]

## 2016-12-10 DIAGNOSIS — F29 Unspecified psychosis not due to a substance or known physiological condition: Secondary | ICD-10-CM | POA: Diagnosis not present

## 2016-12-16 DIAGNOSIS — R5383 Other fatigue: Secondary | ICD-10-CM | POA: Diagnosis not present

## 2016-12-16 DIAGNOSIS — I10 Essential (primary) hypertension: Secondary | ICD-10-CM | POA: Diagnosis not present

## 2016-12-16 DIAGNOSIS — Z7984 Long term (current) use of oral hypoglycemic drugs: Secondary | ICD-10-CM | POA: Diagnosis not present

## 2016-12-16 DIAGNOSIS — E119 Type 2 diabetes mellitus without complications: Secondary | ICD-10-CM | POA: Diagnosis not present

## 2016-12-16 DIAGNOSIS — R350 Frequency of micturition: Secondary | ICD-10-CM | POA: Diagnosis not present

## 2016-12-16 DIAGNOSIS — F259 Schizoaffective disorder, unspecified: Secondary | ICD-10-CM | POA: Diagnosis not present

## 2016-12-17 DIAGNOSIS — F29 Unspecified psychosis not due to a substance or known physiological condition: Secondary | ICD-10-CM | POA: Diagnosis not present

## 2016-12-29 DIAGNOSIS — E119 Type 2 diabetes mellitus without complications: Secondary | ICD-10-CM | POA: Diagnosis not present

## 2016-12-29 DIAGNOSIS — E785 Hyperlipidemia, unspecified: Secondary | ICD-10-CM | POA: Diagnosis not present

## 2016-12-29 DIAGNOSIS — Z7984 Long term (current) use of oral hypoglycemic drugs: Secondary | ICD-10-CM | POA: Diagnosis not present

## 2016-12-29 DIAGNOSIS — E559 Vitamin D deficiency, unspecified: Secondary | ICD-10-CM | POA: Diagnosis not present

## 2016-12-29 DIAGNOSIS — I10 Essential (primary) hypertension: Secondary | ICD-10-CM | POA: Diagnosis not present

## 2016-12-29 DIAGNOSIS — Z0000 Encounter for general adult medical examination without abnormal findings: Secondary | ICD-10-CM | POA: Diagnosis not present

## 2016-12-29 DIAGNOSIS — R399 Unspecified symptoms and signs involving the genitourinary system: Secondary | ICD-10-CM | POA: Diagnosis not present

## 2017-01-03 ENCOUNTER — Encounter: Payer: Medicare Other | Attending: General Surgery | Admitting: Dietician

## 2017-01-03 DIAGNOSIS — Z888 Allergy status to other drugs, medicaments and biological substances status: Secondary | ICD-10-CM | POA: Insufficient documentation

## 2017-01-03 DIAGNOSIS — Z79899 Other long term (current) drug therapy: Secondary | ICD-10-CM | POA: Diagnosis not present

## 2017-01-03 DIAGNOSIS — Z6839 Body mass index (BMI) 39.0-39.9, adult: Secondary | ICD-10-CM | POA: Insufficient documentation

## 2017-01-03 DIAGNOSIS — Z713 Dietary counseling and surveillance: Secondary | ICD-10-CM | POA: Diagnosis not present

## 2017-01-04 ENCOUNTER — Encounter: Payer: Self-pay | Admitting: Dietician

## 2017-01-04 NOTE — Progress Notes (Addendum)
Supervised Weight Loss Class:  Appt start time: 1600   End time:  1630.  Patient was seen on 01/03/2017 for the "Protein Shakes" Supervised Weight Loss Class at [facility].   Surgery type: Sleeve gastrectomy Start weight at [facility]: 223 lbs on 10/28/2016 Weight today: 229.1 lbs Weight change: 1 lbs loss  Usual physical activity: did not assess today Encouraged to engage in 150 minutes of moderate physical activity including cardiovascular and weight baring weekly.  Diet to Follow: 1400-1600 calories 158-170 g carbohydrates 105-112 g protein 39-42 g fat  The following learning objectives were met by the patient during this class:  Objectives: -Review when protein shakes will be recommended in the surgery process -Identify criteria for acceptable protein shakes -Provide examples of acceptable and unacceptable protein shakes -Remind patients that taste preferences may change after surgery  Goals: -Find 1 or more protein shakes that meet the criteria  -Look for a shake that is acceptable for taste and budget  Handouts given: Protein shakes brochure  Sample provided and patient instructed on proper use: Premier protein shake (vanilla - qty 1) Lot#: [id number] Exp: 09/2017

## 2017-01-06 DIAGNOSIS — F29 Unspecified psychosis not due to a substance or known physiological condition: Secondary | ICD-10-CM | POA: Diagnosis not present

## 2017-01-11 DIAGNOSIS — K219 Gastro-esophageal reflux disease without esophagitis: Secondary | ICD-10-CM | POA: Diagnosis present

## 2017-01-11 DIAGNOSIS — I10 Essential (primary) hypertension: Secondary | ICD-10-CM | POA: Diagnosis present

## 2017-01-11 DIAGNOSIS — R45851 Suicidal ideations: Secondary | ICD-10-CM | POA: Diagnosis present

## 2017-01-11 DIAGNOSIS — Z7984 Long term (current) use of oral hypoglycemic drugs: Secondary | ICD-10-CM | POA: Diagnosis not present

## 2017-01-11 DIAGNOSIS — F333 Major depressive disorder, recurrent, severe with psychotic symptoms: Secondary | ICD-10-CM | POA: Diagnosis not present

## 2017-01-11 DIAGNOSIS — F209 Schizophrenia, unspecified: Secondary | ICD-10-CM | POA: Diagnosis not present

## 2017-01-11 DIAGNOSIS — E785 Hyperlipidemia, unspecified: Secondary | ICD-10-CM | POA: Diagnosis present

## 2017-01-11 DIAGNOSIS — E119 Type 2 diabetes mellitus without complications: Secondary | ICD-10-CM | POA: Diagnosis present

## 2017-01-11 DIAGNOSIS — F332 Major depressive disorder, recurrent severe without psychotic features: Secondary | ICD-10-CM | POA: Diagnosis present

## 2017-01-11 DIAGNOSIS — Z62810 Personal history of physical and sexual abuse in childhood: Secondary | ICD-10-CM | POA: Diagnosis present

## 2017-01-11 DIAGNOSIS — F4481 Dissociative identity disorder: Secondary | ICD-10-CM | POA: Diagnosis present

## 2017-01-20 DIAGNOSIS — F29 Unspecified psychosis not due to a substance or known physiological condition: Secondary | ICD-10-CM | POA: Diagnosis not present

## 2017-01-21 DIAGNOSIS — F251 Schizoaffective disorder, depressive type: Secondary | ICD-10-CM | POA: Diagnosis not present

## 2017-01-21 DIAGNOSIS — T43201A Poisoning by unspecified antidepressants, accidental (unintentional), initial encounter: Secondary | ICD-10-CM | POA: Diagnosis not present

## 2017-01-21 DIAGNOSIS — T43222A Poisoning by selective serotonin reuptake inhibitors, intentional self-harm, initial encounter: Secondary | ICD-10-CM | POA: Diagnosis not present

## 2017-01-21 DIAGNOSIS — I10 Essential (primary) hypertension: Secondary | ICD-10-CM | POA: Diagnosis not present

## 2017-01-21 DIAGNOSIS — E119 Type 2 diabetes mellitus without complications: Secondary | ICD-10-CM | POA: Diagnosis not present

## 2017-01-21 DIAGNOSIS — E7800 Pure hypercholesterolemia, unspecified: Secondary | ICD-10-CM | POA: Diagnosis not present

## 2017-01-21 DIAGNOSIS — T424X1A Poisoning by benzodiazepines, accidental (unintentional), initial encounter: Secondary | ICD-10-CM | POA: Diagnosis not present

## 2017-01-21 DIAGNOSIS — K219 Gastro-esophageal reflux disease without esophagitis: Secondary | ICD-10-CM | POA: Diagnosis not present

## 2017-01-21 DIAGNOSIS — T424X2A Poisoning by benzodiazepines, intentional self-harm, initial encounter: Secondary | ICD-10-CM | POA: Diagnosis not present

## 2017-01-21 DIAGNOSIS — T50904A Poisoning by unspecified drugs, medicaments and biological substances, undetermined, initial encounter: Secondary | ICD-10-CM | POA: Diagnosis not present

## 2017-01-21 DIAGNOSIS — Z79899 Other long term (current) drug therapy: Secondary | ICD-10-CM | POA: Diagnosis not present

## 2017-01-23 DIAGNOSIS — Z880 Allergy status to penicillin: Secondary | ICD-10-CM | POA: Diagnosis not present

## 2017-01-23 DIAGNOSIS — I10 Essential (primary) hypertension: Secondary | ICD-10-CM | POA: Diagnosis not present

## 2017-01-23 DIAGNOSIS — R9431 Abnormal electrocardiogram [ECG] [EKG]: Secondary | ICD-10-CM | POA: Diagnosis not present

## 2017-01-23 DIAGNOSIS — R51 Headache: Secondary | ICD-10-CM | POA: Diagnosis not present

## 2017-01-23 DIAGNOSIS — E669 Obesity, unspecified: Secondary | ICD-10-CM | POA: Diagnosis not present

## 2017-01-23 DIAGNOSIS — Z6839 Body mass index (BMI) 39.0-39.9, adult: Secondary | ICD-10-CM | POA: Diagnosis not present

## 2017-01-23 DIAGNOSIS — E559 Vitamin D deficiency, unspecified: Secondary | ICD-10-CM | POA: Diagnosis not present

## 2017-01-23 DIAGNOSIS — F39 Unspecified mood [affective] disorder: Secondary | ICD-10-CM | POA: Diagnosis not present

## 2017-01-23 DIAGNOSIS — T71162A Asphyxiation due to hanging, intentional self-harm, initial encounter: Secondary | ICD-10-CM | POA: Diagnosis not present

## 2017-01-23 DIAGNOSIS — D509 Iron deficiency anemia, unspecified: Secondary | ICD-10-CM | POA: Diagnosis not present

## 2017-01-23 DIAGNOSIS — T43201A Poisoning by unspecified antidepressants, accidental (unintentional), initial encounter: Secondary | ICD-10-CM | POA: Diagnosis not present

## 2017-01-23 DIAGNOSIS — F4310 Post-traumatic stress disorder, unspecified: Secondary | ICD-10-CM | POA: Diagnosis present

## 2017-01-23 DIAGNOSIS — T424X1A Poisoning by benzodiazepines, accidental (unintentional), initial encounter: Secondary | ICD-10-CM | POA: Diagnosis not present

## 2017-01-23 DIAGNOSIS — G4730 Sleep apnea, unspecified: Secondary | ICD-10-CM | POA: Diagnosis not present

## 2017-01-23 DIAGNOSIS — Z713 Dietary counseling and surveillance: Secondary | ICD-10-CM | POA: Diagnosis not present

## 2017-01-23 DIAGNOSIS — I4581 Long QT syndrome: Secondary | ICD-10-CM | POA: Diagnosis not present

## 2017-01-23 DIAGNOSIS — T1491XA Suicide attempt, initial encounter: Secondary | ICD-10-CM | POA: Diagnosis not present

## 2017-01-23 DIAGNOSIS — F29 Unspecified psychosis not due to a substance or known physiological condition: Secondary | ICD-10-CM | POA: Diagnosis not present

## 2017-01-23 DIAGNOSIS — R45851 Suicidal ideations: Secondary | ICD-10-CM | POA: Diagnosis not present

## 2017-01-23 DIAGNOSIS — Z881 Allergy status to other antibiotic agents status: Secondary | ICD-10-CM | POA: Diagnosis not present

## 2017-01-23 DIAGNOSIS — F333 Major depressive disorder, recurrent, severe with psychotic symptoms: Secondary | ICD-10-CM | POA: Diagnosis not present

## 2017-01-23 DIAGNOSIS — F339 Major depressive disorder, recurrent, unspecified: Secondary | ICD-10-CM | POA: Diagnosis not present

## 2017-01-23 DIAGNOSIS — Z888 Allergy status to other drugs, medicaments and biological substances status: Secondary | ICD-10-CM | POA: Diagnosis not present

## 2017-01-23 DIAGNOSIS — R0602 Shortness of breath: Secondary | ICD-10-CM | POA: Diagnosis not present

## 2017-01-23 DIAGNOSIS — K219 Gastro-esophageal reflux disease without esophagitis: Secondary | ICD-10-CM | POA: Diagnosis not present

## 2017-01-23 DIAGNOSIS — Z7984 Long term (current) use of oral hypoglycemic drugs: Secondary | ICD-10-CM | POA: Diagnosis not present

## 2017-01-23 DIAGNOSIS — Z590 Homelessness: Secondary | ICD-10-CM | POA: Diagnosis not present

## 2017-01-23 DIAGNOSIS — Z79899 Other long term (current) drug therapy: Secondary | ICD-10-CM | POA: Diagnosis not present

## 2017-01-23 DIAGNOSIS — F329 Major depressive disorder, single episode, unspecified: Secondary | ICD-10-CM | POA: Diagnosis not present

## 2017-01-23 DIAGNOSIS — F419 Anxiety disorder, unspecified: Secondary | ICD-10-CM | POA: Diagnosis not present

## 2017-01-23 DIAGNOSIS — E7800 Pure hypercholesterolemia, unspecified: Secondary | ICD-10-CM | POA: Diagnosis not present

## 2017-01-23 DIAGNOSIS — E119 Type 2 diabetes mellitus without complications: Secondary | ICD-10-CM | POA: Diagnosis not present

## 2017-01-24 DIAGNOSIS — E669 Obesity, unspecified: Secondary | ICD-10-CM | POA: Insufficient documentation

## 2017-01-24 DIAGNOSIS — I10 Essential (primary) hypertension: Secondary | ICD-10-CM | POA: Diagnosis not present

## 2017-01-24 DIAGNOSIS — K219 Gastro-esophageal reflux disease without esophagitis: Secondary | ICD-10-CM | POA: Insufficient documentation

## 2017-01-24 DIAGNOSIS — F333 Major depressive disorder, recurrent, severe with psychotic symptoms: Secondary | ICD-10-CM | POA: Diagnosis not present

## 2017-01-24 DIAGNOSIS — T1491XA Suicide attempt, initial encounter: Secondary | ICD-10-CM | POA: Insufficient documentation

## 2017-01-24 DIAGNOSIS — R9431 Abnormal electrocardiogram [ECG] [EKG]: Secondary | ICD-10-CM | POA: Diagnosis not present

## 2017-01-26 NOTE — Progress Notes (Signed)
  Pre-Op Assessment Visit:  Pre-Operative Sleeve Gastrectomy Surgery  Medical Nutrition Therapy:  Appt start time: [id number]   End time:  1050.  Patient was seen on 10/28/2016 for Pre-Operative Nutrition Assessment. Assessment and letter of approval faxed to [facility] Bariatric Surgery Program coordinator on 10/28/2016.   Patient has an extensive psychiatric history but denies current feelings of depression or suicidal thoughts. Verbalized understanding of Pre Op goals and has begun to cut back on sweet drinks. States that she is highly motivated to have bariatric surgery. Encouraged to engage in 45 minutes of moderate physical activity including cardiovascular and weight baring weekly  Start weight at [facility]: 222.14 Weight today: 222.14  24 hr Dietary Recall: First Meal: toast Snack: fruit Second Meal: soup Snack: chips Third Meal: mashed potatoes and chicken Snack: pie Beverages: water, soda   -Follow diet recommendations listed below   Energy and Macronutrient Recomendations: Calories: 1500 Carbohydrate: 170 Protein: 112 Fat: 42  Preferred Learning Style:   No preference indicated   Learning Readiness:   Ready  Handouts given during visit include:  Pre-Op Goals Bariatric Surgery Protein Shakes   During the appointment today the following Pre-Op Goals were reviewed with the patient: Maintain or lose weight as instructed by your surgeon Make healthy food choices Begin to limit portion sizes Limited concentrated sugars and fried foods Keep fat/sugar in the single digits per serving on  food labels Practice CHEWING your food  (aim for 30 chews per bite or until applesauce consistency) Practice not drinking 15 minutes before, during, and 30 minutes after each meal/snack Avoid all carbonated beverages  Avoid/limit caffeinated beverages  Avoid all sugar-sweetened beverages Consume 3 meals per day; eat every 3-5 hours Make a list of non-food related  activities Aim for 64-100 ounces of FLUID daily  Aim for at least 60-80 grams of PROTEIN daily Look for a liquid protein source that contain ?15 g protein and ?5 g carbohydrate  (ex: shakes, drinks, shots)  Patient-Centered Goals: Would like to be more active with her daughter (walk more), eat healthier/make better choices, improve diabetes   9-10 confidence in doing Pre-Op goals  Demonstrated degree of understanding via:  Teach Back  Teaching Method Utilized:  Visual Auditory Hands on  Barriers to learning/adherence to lifestyle change: hx of major depression  Patient to call the [facility] to enroll in Pre-Op and Post-Op Nutrition Education when surgery date is scheduled.

## 2017-01-31 ENCOUNTER — Encounter: Payer: Medicare Other | Attending: General Surgery | Admitting: Dietician

## 2017-01-31 DIAGNOSIS — Z79899 Other long term (current) drug therapy: Secondary | ICD-10-CM | POA: Insufficient documentation

## 2017-01-31 DIAGNOSIS — Z6839 Body mass index (BMI) 39.0-39.9, adult: Secondary | ICD-10-CM | POA: Insufficient documentation

## 2017-01-31 DIAGNOSIS — Z713 Dietary counseling and surveillance: Secondary | ICD-10-CM | POA: Diagnosis not present

## 2017-01-31 DIAGNOSIS — Z888 Allergy status to other drugs, medicaments and biological substances status: Secondary | ICD-10-CM | POA: Diagnosis not present

## 2017-01-31 NOTE — Progress Notes (Addendum)
Supervised Weight Loss:  Appt start time: 410   End time:  425  [name] [name] returns having gained 5 lbs. She feels like she has been retaining water due to going to a Superbowl party last night. Has not had any sodas but occasionally has half sweet tea. No longer addicted to caffeine. Sticking with water and sometimes water with sugar free flavoring. Has been working on cutting out pasta and cereal. She lives alone and hopes to experiment with some creative bariatric-friendly recipes. Plans to make large quantities of food and have leftovers. Has been working on avoiding fluids with meals. Likes Premier protein shakes.   Notices that her blood sugars have improved (79-109 mg/dL).   Surgery type: Sleeve gastrectomy Start weight at [facility]: 223 lbs on 10/28/2016 Weight today: 234.2 lbs Weight change: 5 lbs gain BMI: 40.2  Preferred Learning Style:   No preference indicated   Learning Readiness:   Ready  MEDICATIONS: see list   Usual physical activity: did not assess today Encouraged to engage in 150 minutes of moderate physical activity including cardiovascular and weight baring weekly.  Diet to Follow: 1400-1600 calories 158-170 g carbohydrates 105-112 g protein 39-42 g fat   Nutritional Diagnosis:  [place]-3.3 Overweight/obesity related to past poor dietary habits and physical inactivity as evidenced by patient in SWL for pending bariatric surgery following dietary guidelines for continued weight loss.    Intervention:  Nutrition counseling for upcoming Bariatric Surgery.  Teaching Method Utilized:  Visual Auditory Hands on  Handouts given during visit include:  none  Barriers to learning/adherence to lifestyle change: none  Demonstrated degree of understanding via:  Teach Back   Monitoring/Evaluation:  Dietary intake, exercise, and body weight in 4 week(s).

## 2017-01-31 NOTE — Patient Instructions (Addendum)
-  Continue working on pre op goals

## 2017-02-03 DIAGNOSIS — T391X2A Poisoning by 4-Aminophenol derivatives, intentional self-harm, initial encounter: Secondary | ICD-10-CM | POA: Diagnosis not present

## 2017-02-03 DIAGNOSIS — R45851 Suicidal ideations: Secondary | ICD-10-CM | POA: Diagnosis not present

## 2017-02-03 DIAGNOSIS — F29 Unspecified psychosis not due to a substance or known physiological condition: Secondary | ICD-10-CM | POA: Diagnosis not present

## 2017-02-03 DIAGNOSIS — F251 Schizoaffective disorder, depressive type: Secondary | ICD-10-CM | POA: Diagnosis not present

## 2017-02-03 DIAGNOSIS — Z6841 Body Mass Index (BMI) 40.0 and over, adult: Secondary | ICD-10-CM | POA: Diagnosis not present

## 2017-02-03 DIAGNOSIS — F5000 Anorexia nervosa, unspecified: Secondary | ICD-10-CM | POA: Diagnosis not present

## 2017-02-03 DIAGNOSIS — F333 Major depressive disorder, recurrent, severe with psychotic symptoms: Secondary | ICD-10-CM | POA: Diagnosis not present

## 2017-02-03 DIAGNOSIS — I4581 Long QT syndrome: Secondary | ICD-10-CM | POA: Diagnosis not present

## 2017-02-03 DIAGNOSIS — F603 Borderline personality disorder: Secondary | ICD-10-CM | POA: Diagnosis not present

## 2017-02-04 DIAGNOSIS — G4730 Sleep apnea, unspecified: Secondary | ICD-10-CM | POA: Diagnosis present

## 2017-02-04 DIAGNOSIS — Z881 Allergy status to other antibiotic agents status: Secondary | ICD-10-CM | POA: Diagnosis not present

## 2017-02-04 DIAGNOSIS — F603 Borderline personality disorder: Secondary | ICD-10-CM | POA: Diagnosis present

## 2017-02-04 DIAGNOSIS — M1712 Unilateral primary osteoarthritis, left knee: Secondary | ICD-10-CM | POA: Diagnosis present

## 2017-02-04 DIAGNOSIS — G4733 Obstructive sleep apnea (adult) (pediatric): Secondary | ICD-10-CM | POA: Diagnosis not present

## 2017-02-04 DIAGNOSIS — E119 Type 2 diabetes mellitus without complications: Secondary | ICD-10-CM | POA: Diagnosis present

## 2017-02-04 DIAGNOSIS — Z6841 Body Mass Index (BMI) 40.0 and over, adult: Secondary | ICD-10-CM | POA: Diagnosis not present

## 2017-02-04 DIAGNOSIS — F333 Major depressive disorder, recurrent, severe with psychotic symptoms: Secondary | ICD-10-CM | POA: Diagnosis not present

## 2017-02-04 DIAGNOSIS — F5000 Anorexia nervosa, unspecified: Secondary | ICD-10-CM | POA: Diagnosis present

## 2017-02-04 DIAGNOSIS — Z7984 Long term (current) use of oral hypoglycemic drugs: Secondary | ICD-10-CM | POA: Diagnosis not present

## 2017-02-04 DIAGNOSIS — Z9109 Other allergy status, other than to drugs and biological substances: Secondary | ICD-10-CM | POA: Diagnosis not present

## 2017-02-04 DIAGNOSIS — Z915 Personal history of self-harm: Secondary | ICD-10-CM | POA: Diagnosis not present

## 2017-02-04 DIAGNOSIS — I4581 Long QT syndrome: Secondary | ICD-10-CM | POA: Diagnosis present

## 2017-02-04 DIAGNOSIS — R9431 Abnormal electrocardiogram [ECG] [EKG]: Secondary | ICD-10-CM | POA: Diagnosis not present

## 2017-02-04 DIAGNOSIS — E669 Obesity, unspecified: Secondary | ICD-10-CM | POA: Diagnosis present

## 2017-02-04 DIAGNOSIS — G4700 Insomnia, unspecified: Secondary | ICD-10-CM | POA: Diagnosis present

## 2017-02-04 DIAGNOSIS — F419 Anxiety disorder, unspecified: Secondary | ICD-10-CM | POA: Diagnosis present

## 2017-02-04 DIAGNOSIS — Z880 Allergy status to penicillin: Secondary | ICD-10-CM | POA: Diagnosis not present

## 2017-02-04 DIAGNOSIS — E7800 Pure hypercholesterolemia, unspecified: Secondary | ICD-10-CM | POA: Diagnosis present

## 2017-02-04 DIAGNOSIS — Z791 Long term (current) use of non-steroidal anti-inflammatories (NSAID): Secondary | ICD-10-CM | POA: Diagnosis not present

## 2017-02-04 DIAGNOSIS — Z86711 Personal history of pulmonary embolism: Secondary | ICD-10-CM | POA: Diagnosis not present

## 2017-02-04 DIAGNOSIS — F515 Nightmare disorder: Secondary | ICD-10-CM | POA: Diagnosis present

## 2017-02-04 DIAGNOSIS — K219 Gastro-esophageal reflux disease without esophagitis: Secondary | ICD-10-CM | POA: Diagnosis not present

## 2017-02-04 DIAGNOSIS — I10 Essential (primary) hypertension: Secondary | ICD-10-CM | POA: Diagnosis present

## 2017-02-04 DIAGNOSIS — F251 Schizoaffective disorder, depressive type: Secondary | ICD-10-CM | POA: Diagnosis not present

## 2017-02-04 DIAGNOSIS — R45851 Suicidal ideations: Secondary | ICD-10-CM | POA: Diagnosis not present

## 2017-02-04 DIAGNOSIS — Z79899 Other long term (current) drug therapy: Secondary | ICD-10-CM | POA: Diagnosis not present

## 2017-02-24 DIAGNOSIS — F29 Unspecified psychosis not due to a substance or known physiological condition: Secondary | ICD-10-CM | POA: Diagnosis not present

## 2017-02-25 DIAGNOSIS — F251 Schizoaffective disorder, depressive type: Secondary | ICD-10-CM | POA: Diagnosis not present

## 2017-02-28 ENCOUNTER — Encounter: Payer: Self-pay | Admitting: Skilled Nursing Facility1

## 2017-02-28 ENCOUNTER — Encounter: Payer: Medicare Other | Attending: General Surgery | Admitting: Skilled Nursing Facility1

## 2017-02-28 DIAGNOSIS — Z79899 Other long term (current) drug therapy: Secondary | ICD-10-CM | POA: Insufficient documentation

## 2017-02-28 DIAGNOSIS — Z888 Allergy status to other drugs, medicaments and biological substances status: Secondary | ICD-10-CM | POA: Diagnosis not present

## 2017-02-28 DIAGNOSIS — E119 Type 2 diabetes mellitus without complications: Secondary | ICD-10-CM

## 2017-02-28 DIAGNOSIS — Z6839 Body mass index (BMI) 39.0-39.9, adult: Secondary | ICD-10-CM | POA: Insufficient documentation

## 2017-02-28 DIAGNOSIS — Z713 Dietary counseling and surveillance: Secondary | ICD-10-CM | POA: Diagnosis not present

## 2017-02-28 NOTE — Progress Notes (Signed)
Supervised Weight Loss:  Appt start time: 410   End time:  425  Pt returns having lost 17 pounds since last month. Pt states she Had electroshock therapy but has stopped them. Pt states she checks her blood sugar: 94.  Pt states she was diagnosed in October with diabetes. Pt states her last A1C was 6.9. Pt states she needs 2 more appointments and then she will be done. Pt states she has been working on chewing.  Pt was advised to not drop below the 35 BMI range for insurance coverage.  Pt is working on not drinking with meals for next appointment.   Surgery type: Sleeve gastrectomy Start weight at [facility]: 223 lbs on 10/28/2016 Weight today: 217 lbs Weight change: 17 pounds weight loss BMI: 37.33  Diet Recall: Oatmeal and banana  Peanut butter and banana sandwich Chicken with potatoes carrots and celery and black eyed peas Beverage: water or Gatorade   Preferred Learning Style:   No preference indicated   Learning Readiness:   Ready  MEDICATIONS: see list   Usual physical activity: did not assess today Encouraged to engage in 150 minutes of moderate physical activity including cardiovascular and weight baring weekly.  Diet to Follow: 1400-1600 calories 158-170 g carbohydrates 105-112 g protein 39-42 g fat   Nutritional Diagnosis:  [place]-3.3 Overweight/obesity related to past poor dietary habits and physical inactivity as evidenced by patient in SWL for pending bariatric surgery following dietary guidelines for continued weight loss.    Intervention:  Nutrition counseling for upcoming Bariatric Surgery.  Teaching Method Utilized:  Visual Auditory Hands on  Handouts given during visit include:  none  Barriers to learning/adherence to lifestyle change: none  Demonstrated degree of understanding via:  Teach Back   Monitoring/Evaluation:  Dietary intake, exercise, and body weight in 4 week(s).

## 2017-03-03 DIAGNOSIS — F29 Unspecified psychosis not due to a substance or known physiological condition: Secondary | ICD-10-CM | POA: Diagnosis not present

## 2017-03-08 DIAGNOSIS — T7840XA Allergy, unspecified, initial encounter: Secondary | ICD-10-CM | POA: Diagnosis not present

## 2017-03-08 DIAGNOSIS — N3000 Acute cystitis without hematuria: Secondary | ICD-10-CM | POA: Diagnosis not present

## 2017-03-09 ENCOUNTER — Emergency Department (HOSPITAL_COMMUNITY): Payer: Medicare Other

## 2017-03-09 ENCOUNTER — Emergency Department (HOSPITAL_COMMUNITY)
Admission: EM | Admit: 2017-03-09 | Discharge: 2017-03-09 | Disposition: A | Discharge status: Home / Self Care | Payer: Medicare Other | Attending: Emergency Medicine | Admitting: Emergency Medicine

## 2017-03-09 ENCOUNTER — Encounter (HOSPITAL_COMMUNITY): Payer: Self-pay | Admitting: Emergency Medicine

## 2017-03-09 DIAGNOSIS — E119 Type 2 diabetes mellitus without complications: Secondary | ICD-10-CM | POA: Insufficient documentation

## 2017-03-09 DIAGNOSIS — I10 Essential (primary) hypertension: Secondary | ICD-10-CM | POA: Diagnosis not present

## 2017-03-09 DIAGNOSIS — R072 Precordial pain: Secondary | ICD-10-CM | POA: Diagnosis not present

## 2017-03-09 DIAGNOSIS — R000 Tachycardia, unspecified: Secondary | ICD-10-CM

## 2017-03-09 DIAGNOSIS — R0789 Other chest pain: Secondary | ICD-10-CM | POA: Diagnosis present

## 2017-03-09 DIAGNOSIS — R079 Chest pain, unspecified: Secondary | ICD-10-CM | POA: Diagnosis not present

## 2017-03-09 LAB — CBC
HEMATOCRIT: 39.6 % (ref 36.0–46.0)
Hemoglobin: 13 g/dL (ref 12.0–15.0)
MCH: 27.7 pg (ref 26.0–34.0)
MCHC: 32.8 g/dL (ref 30.0–36.0)
MCV: 84.3 fL (ref 78.0–100.0)
Platelets: 363 10*3/uL (ref 150–400)
RBC: 4.7 MIL/uL (ref 3.87–5.11)
RDW: 14 % (ref 11.5–15.5)
WBC: 15.6 10*3/uL — AB (ref 4.0–10.5)

## 2017-03-09 LAB — BASIC METABOLIC PANEL
Anion gap: 8 (ref 5–15)
BUN: 12 mg/dL (ref 6–20)
CHLORIDE: 107 mmol/L (ref 101–111)
CO2: 22 mmol/L (ref 22–32)
Calcium: 9.9 mg/dL (ref 8.9–10.3)
Creatinine, Ser: 0.98 mg/dL (ref 0.44–1.00)
GFR calc Af Amer: >60 mL/min (ref >60)
GFR calc non Af Amer: >60 mL/min (ref >60)
GLUCOSE: 210 mg/dL — AB (ref 65–99)
POTASSIUM: 3.8 mmol/L (ref 3.5–5.1)
Sodium: 137 mmol/L (ref 135–145)

## 2017-03-09 LAB — I-STAT TROPONIN, ED
Troponin i, poc: 0 ng/mL (ref 0.00–0.08)
Troponin i, poc: 0 ng/mL (ref 0.00–0.08)

## 2017-03-09 LAB — D-DIMER, QUANTITATIVE (NOT AT ARMC): D DIMER QUANT: 0.68 ug{FEU}/mL — AB (ref 0.00–0.50)

## 2017-03-09 LAB — POC URINE PREG, ED: Preg Test, Ur: NEGATIVE

## 2017-03-09 LAB — HCG, QUANTITATIVE, PREGNANCY: hCG, Beta Chain, Quant, S: <1 m[IU]/mL (ref <5)

## 2017-03-09 MED ORDER — SODIUM CHLORIDE 0.9 % IV BOLUS (SEPSIS)
500.0000 mL | Freq: Once | INTRAVENOUS | Status: AC
  Administered 2017-03-09: 500 mL via INTRAVENOUS

## 2017-03-09 MED ORDER — ACETAMINOPHEN 500 MG PO TABS
1000.0000 mg | ORAL_TABLET | Freq: Once | ORAL | Status: AC
  Administered 2017-03-09: 1000 mg via ORAL
  Filled 2017-03-09: qty 2

## 2017-03-09 MED ORDER — EPINEPHRINE 0.3 MG/0.3ML IJ SOAJ: 0.3000 mg | Freq: Once | INTRAMUSCULAR | 0 refills | Status: AC

## 2017-03-09 MED ORDER — IOPAMIDOL (ISOVUE-370) INJECTION 76%
100.0000 mL | Freq: Once | INTRAVENOUS | Status: AC | PRN
  Administered 2017-03-09: 100 mL via INTRAVENOUS

## 2017-03-09 MED ORDER — IOPAMIDOL (ISOVUE-370) INJECTION 76%
INTRAVENOUS | Status: AC
  Filled 2017-03-09: qty 100

## 2017-03-09 NOTE — Discharge Instructions (Signed)

## 2017-03-09 NOTE — ED Notes (Signed)
Bed: WTR9 Expected date:  Expected time:  Means of arrival:  Comments: 

## 2017-03-09 NOTE — ED Notes (Signed)
ED Provider at bedside. 

## 2017-03-09 NOTE — ED Provider Notes (Signed)
Emergency Department Provider Note   I have reviewed the triage vital signs and the nursing notes.   HISTORY  Chief Complaint Chest Pain   HPI [name] [name] is a 46 y.o. female with PMH of anxiety, depression, DM, HLD, HTN, and h/o PE after surgery presents to the emergency room in for evaluation of chest pain. The patient was seen in the [facility] emergency department yesterday with an apparent allergic reaction. The patient states she developed a diffuse itchy rash with some chest discomfort and difficulty breathing. She states she was given an epinephrine injection along with steroids. She was also found to have a urinary tract infection during that visit and started on antibiotics. She has not had a prior anaphylactic reactions in the past. No new medications prior to symptom onset.   Patient states that the rash resolved but she was feeling anxious overnight and developed some intermittent chest discomfort this morning. She called her primary care physician who referred her to the emergency department. She describes the pain as sharp, mild to moderate, and feels like a "catching" pain under the left breast. No exertional symptoms. No fever, chills, productive cough. Her chest pain has resolved at the time of my evaluation. No radiation of symptoms.    Past Medical History:  Diagnosis Date  . Anxiety   . Blood dyscrasia    pt not aware  . Depression   . Diabetes mellitus without complication ([place])   . Diarrhea, functional   . Diverticulitis   . Headache    migraines  . Heart murmur    asa child   . Hx pulmonary embolism 2013   after surgery from [place]  . Hyperlipidemia   . Hypertension    not on medication  . Iron deficiency anemia, unspecified 11/15/2013  . Kidney stone   . Paranoid schizophrenia ([place])   . Personality disorder   . Renal insufficiency     Patient Active Problem List   Diagnosis Date Noted  . Hypertension 03/30/2016  . Serotonin syndrome  03/30/2016  . Elevated blood pressure 03/30/2016  . Leukocytosis 03/30/2016  . Precordial chest pain 03/30/2016  . Incisional hernia 12/24/2015  . Drug overdose, intentional ([place])   . Hyperprolactinemia ([place]) 09/11/2015  . Schizoaffective disorder, bipolar type ([place]) 09/10/2015  . Hx of borderline personality disorder 09/10/2015  . PTSD (post-traumatic stress disorder) 09/10/2015  . Suicide attempt ([place]) 09/05/2015  . Overdose, drug 09/05/2015  . Overdose 09/05/2015  . Drug overdose   . QT prolongation   . Weight gain 03/06/2015  . [place] 03/06/2015  . Iron deficiency anemia, unspecified 11/15/2013  . Liver mass, left lobe 05/28/2013  . Postop check 03/13/2013  . Postoperative wound infection 02/23/2013  . History of colostomy 02/16/2013  . Acute blood loss anemia 08/01/2012  . Renal calculus, left 03/31/2012  . Hydronephrosis, left 03/31/2012    Past Surgical History:  Procedure Laterality Date  . Breast Reduction Left 06/2014  . COLOSTOMY  07/31/2012   Procedure: COLOSTOMY;  Surgeon: Harl Bowie, MD;  Location: [place];  Service: General;  Laterality: Right;  . COLOSTOMY CLOSURE N/A 02/15/2013   Procedure: COLOSTOMY CLOSURE;  Surgeon: Gwenyth Ober, MD;  Location: [place];  Service: General;  Laterality: N/A;  Reversal of colostomy  . COLOSTOMY REVERSAL    . DILATATION & CURETTAGE/HYSTEROSCOPY WITH TRUECLEAR N/A 10/24/2013   Procedure: DILATATION & CURETTAGE/HYSTEROSCOPY WITH TRUECLEAR, CERVICAL BLOCK;  Surgeon: Marylynn Pearson, MD;  Location: [facility];  Service: Gynecology;  Laterality: N/A;  . [place]  2013   ABDOMINAL SURGERY  . INCISIONAL HERNIA REPAIR N/A 12/24/2015   Procedure:  INCISIONAL HERNIA REPAIR WITH MESH;  Surgeon: Coralie Keens, MD;  Location: [place];  Service: General;  Laterality: N/A;  . INSERTION OF MESH N/A 12/24/2015   Procedure: INSERTION OF MESH;  Surgeon: Coralie Keens, MD;  Location: [place];  Service: General;  Laterality: N/A;  . LAPAROTOMY   07/31/2012   Procedure: EXPLORATORY LAPAROTOMY;  Surgeon: Harl Bowie, MD;  Location: [place];  Service: General;  Laterality: N/A;  REPAIR OF PANCREATIC INJURY, EXPLORATION OF RETROPERITONEUM.  [name] NEPHROLITHOTOMY  03/31/2012   Procedure: NEPHROLITHOTOMY PERCUTANEOUS;  Surgeon: Claybon Jabs, MD;  Location: [facility];  Service: Urology;  Laterality: Left;  . OTHER SURGICAL HISTORY     cyst removed from ovary ? side   . TUBAL LIGATION      Current Outpatient Rx  . Order #: [id number] Class: No Print  . Order #: [id number] Class: Historical Med  . Order #: [id number] Class: Normal  . Order #: [id number] Class: Historical Med  . Order #: [id number] Class: Historical Med  . Order #: [id number] Class: Historical Med  . Order #: [id number] Class: Historical Med  . Order #: [id number] Class: Historical Med  . Order #: [id number] Class: Historical Med  . Order #: [id number] Class: Historical Med  . Order #: [id number] Class: Print    Allergies Augmentin [amoxicillin-pot clavulanate]; Enoxaparin; Avelox [moxifloxacin hcl in nacl]; Penicillins; and Sodium hydroxide  Family History  Problem Relation Age of Onset  . Cancer Mother     kidney  . Depression Mother   . Other Neg Hx     adrenal problem    Social History Social History  Substance Use Topics  . Smoking status: Never Smoker  . Smokeless tobacco: Never Used  . Alcohol use No    Review of Systems  Constitutional: No fever/chills Eyes: No visual changes. ENT: No sore throat. Cardiovascular: Positive chest pain. Respiratory: Denies shortness of breath. Gastrointestinal: No abdominal pain.  No nausea, no vomiting.  No diarrhea.  No constipation. Genitourinary: Negative for dysuria. Musculoskeletal: Negative for back pain. Skin: Negative for rash. Neurological: Negative for headaches, focal weakness or numbness.  10-point ROS otherwise negative.  ____________________________________________   PHYSICAL EXAM:  VITAL SIGNS: ED Triage  Vitals  Enc Vitals Group     BP 03/09/17 0957 (!) 150/120     Pulse Rate 03/09/17 0957 101     Resp 03/09/17 0957 18     Temp 03/09/17 0957 98.6 F (37 C)     Temp src --      SpO2 03/09/17 0957 95 %     Weight 03/09/17 0958 217 lb (98.4 kg)     Height 03/09/17 0958 5\' 4"  (1.626 m)     Pain Score 03/09/17 0958 8   Constitutional: Alert and oriented. Well appearing and in no acute distress. Speaking in soft voice.  Eyes: Conjunctivae are normal.  Head: Atraumatic. Nose: No congestion/rhinnorhea. Mouth/Throat: Mucous membranes are moist.   Neck: No stridor.   Cardiovascular: Sinus tachycardia. Good peripheral circulation. Grossly normal heart sounds.   Respiratory: Normal respiratory effort.  No retractions. Lungs CTAB. Gastrointestinal: Soft and nontender. No distention.  Musculoskeletal: No lower extremity tenderness nor edema. No gross deformities of extremities. Neurologic:  Normal speech and language. No gross focal neurologic deficits are appreciated.  Skin:  Skin is warm, dry and intact. No rash noted. Psychiatric: Mood and affect are normal. Speech and behavior are normal. Muted  voice but not responding to internal stimulus.   ____________________________________________   LABS (all labs ordered are listed, but only abnormal results are displayed)  Labs Reviewed  BASIC METABOLIC PANEL - Abnormal; Notable for the following:       Result Value   Glucose, Bld 210 (*)    All other components within normal limits  CBC - Abnormal; Notable for the following:    WBC 15.6 (*)    All other components within normal limits  D-DIMER, QUANTITATIVE (NOT AT [facility]) - Abnormal; Notable for the following:    D-Dimer, Quant 0.68 (*)    All other components within normal limits  HCG, QUANTITATIVE, PREGNANCY  I-STAT TROPOININ, ED  I-STAT BETA HCG BLOOD, ED ([place], [facility], [facility] ONLY)  I-STAT TROPOININ, ED  POC URINE PREG, ED   ____________________________________________  EKG   EKG  Interpretation  Date/Time:  Wednesday March 09 2017 10:06:29 EDT Ventricular Rate:  100 PR Interval:    QRS Duration: 92 QT Interval:  382 QTC Calculation: 493 R Axis:   86 Text Interpretation:  Sinus tachycardia Borderline T abnormalities, inferior leads Borderline prolonged QT interval agree. no change from previous Confirmed by Johnney Killian, MD, Jeannie Done [phone]) on 03/09/2017 10:33:20 AM Also confirmed by Johnney Killian, MD, Jeannie Done [phone]), editor Drema Pry [phone])  on 03/09/2017 10:37:11 AM       ____________________________________________  RADIOLOGY  Dg Chest 2 View  Result Date: 03/09/2017 CLINICAL DATA:  New mid chest pain since yesterday EXAM: CHEST  2 VIEW COMPARISON:  10/22/2016 FINDINGS: Normal heart size and mediastinal contours. No acute infiltrate or edema. No effusion or pneumothorax. No acute osseous findings. IMPRESSION: Negative chest. Electronically Signed   By: Monte Fantasia M.D.   On: 03/09/2017 10:27   Ct Angio Chest Pe W And/or Wo Contrast  Result Date: 03/09/2017 CLINICAL DATA:  46 y/o  F; chest pain and elevated D-dimer. EXAM: CT ANGIOGRAPHY CHEST WITH CONTRAST TECHNIQUE: Multidetector CT imaging of the chest was performed using the standard protocol during bolus administration of intravenous contrast. Multiplanar CT image reconstructions and MIPs were obtained to evaluate the vascular anatomy. CONTRAST:  100 cc Isovue 370 COMPARISON:  CT angiogram of the chest dated 08/22/2013. FINDINGS: Cardiovascular: Satisfactory opacification of the pulmonary arteries to the segmental level. Moderate respiratory motion artifact. No evidence of pulmonary embolism. Normal heart size. No pericardial effusion. Normal caliber thoracic aorta. Mediastinum/Nodes: No enlarged mediastinal, hilar, or axillary lymph nodes. Thyroid gland, trachea, and esophagus demonstrate no significant findings. Lungs/Pleura: Clear lungs. Small bilateral pleural effusions. Stable right upper lobe pulmonary  nodule along major fissure (series 6, image 108), compatible with benign etiology. Upper Abdomen: Partially visualized 11 mm fluid attenuating focus in the left kidney interpolar region probably representing cysts. Musculoskeletal: Mild degenerative changes of the thoracic spine. No acute osseous abnormality is identified. Review of the MIP images confirms the above findings. IMPRESSION: 1. Moderate respiratory motion artifact. No pulmonary embolus identified. 2. Small bilateral pleural effusions. Electronically Signed   By: Kristine Garbe M.D.   On: 03/09/2017 15:37    ____________________________________________   PROCEDURES  Procedure(s) performed:   Procedures  None ____________________________________________   INITIAL IMPRESSION / ASSESSMENT AND PLAN / ED COURSE  Pertinent labs & imaging results that were available during my care of the patient were reviewed by me and considered in my medical decision making (see chart for details).  Patient resents to the emergency room in for evaluation of chest pain this morning in the setting of recent allergic reaction  and diagnosis of urinary tract infection. No new psychiatry medications started prior to allergic reaction. Low suspicion for drug reaction. Her rash has resolved. She is no longer itching. She came in primarily today for chest pain which is sharp and intermittent. She does have a history of pulmonary embolism but was blamed on surgical procedure. She is not on anticoagulation. She does have some sinus tachycardia on arrival but no other signs or symptoms to suggest DVT/PE. Plan for d-dimer and reassessment. Troponin is negative. EKG otherwise unremarkable.   D-dimer slightly elevated. No CP on reassessment. Plan for CTA chest to evaluate for PE.   03:45 PM Patient remains chest pain-free. CT angiogram negative for PE. Discussed primary care physician and cardiology follow-up. Plan for discharge at this time. Will provide  EpiPen with severe allergic reaction yesterday. Patient reports that she was not discharged home with EpiPen. Discussed its use in detail. No clinical concern for occult allergic rxn symptoms.   At this time, I do not feel there is any life-threatening condition present. I have reviewed and discussed all results (EKG, imaging, lab, urine as appropriate), exam findings with patient. I have reviewed nursing notes and appropriate previous records.  I feel the patient is safe to be discharged home without further emergent workup. Discussed usual and customary return precautions. Patient and family (if present) verbalize understanding and are comfortable with this plan.  Patient will follow-up with their primary care provider. If they do not have a primary care provider, information for follow-up has been provided to them. All questions have been answered.  ____________________________________________  FINAL CLINICAL IMPRESSION(S) / ED DIAGNOSES  Final diagnoses:  Precordial chest pain  Tachycardia     MEDICATIONS GIVEN DURING THIS VISIT:  Medications  iopamidol (ISOVUE-370) 76 % injection (not administered)  sodium chloride 0.9 % bolus 500 mL (0 mLs Intravenous Stopped 03/09/17 1355)  acetaminophen (TYLENOL) tablet 1,000 mg (1,000 mg Oral Given 03/09/17 1405)  iopamidol (ISOVUE-370) 76 % injection 100 mL (100 mLs Intravenous Contrast Given 03/09/17 1512)     NEW OUTPATIENT MEDICATIONS STARTED DURING THIS VISIT:  New Prescriptions   EPINEPHRINE (EPIPEN 2-PAK) 0.3 MG/0.3 ML IJ SOAJ INJECTION    Inject 0.3 mLs (0.3 mg total) into the muscle once.      Note:  This document was prepared using Dragon voice recognition software and may include unintentional dictation errors.  Nanda Quinton, MD Emergency Medicine    Margette Fast, MD 03/09/17 [phone]

## 2017-03-09 NOTE — ED Notes (Signed)
Bed: [facility] Expected date:  Expected time:  Means of arrival:  Comments: 

## 2017-03-09 NOTE — ED Triage Notes (Signed)
Patient reports rash to arms, face, chest yesterday. Patient states she is having continued chest pain today.

## 2017-03-10 DIAGNOSIS — R079 Chest pain, unspecified: Secondary | ICD-10-CM | POA: Diagnosis not present

## 2017-03-10 DIAGNOSIS — R21 Rash and other nonspecific skin eruption: Secondary | ICD-10-CM | POA: Diagnosis not present

## 2017-03-11 DIAGNOSIS — F603 Borderline personality disorder: Secondary | ICD-10-CM | POA: Diagnosis not present

## 2017-03-11 DIAGNOSIS — F29 Unspecified psychosis not due to a substance or known physiological condition: Secondary | ICD-10-CM | POA: Diagnosis not present

## 2017-03-16 ENCOUNTER — Encounter: Payer: Self-pay | Admitting: *Deleted

## 2017-03-17 DIAGNOSIS — F29 Unspecified psychosis not due to a substance or known physiological condition: Secondary | ICD-10-CM | POA: Diagnosis not present

## 2017-03-24 DIAGNOSIS — F29 Unspecified psychosis not due to a substance or known physiological condition: Secondary | ICD-10-CM | POA: Diagnosis not present

## 2017-03-28 ENCOUNTER — Encounter: Payer: Medicare Other | Attending: General Surgery | Admitting: Registered"

## 2017-03-28 ENCOUNTER — Ambulatory Visit: Payer: Self-pay | Admitting: Cardiology

## 2017-03-28 DIAGNOSIS — Z713 Dietary counseling and surveillance: Secondary | ICD-10-CM | POA: Insufficient documentation

## 2017-03-28 DIAGNOSIS — Z6839 Body mass index (BMI) 39.0-39.9, adult: Secondary | ICD-10-CM | POA: Insufficient documentation

## 2017-03-28 DIAGNOSIS — Z79899 Other long term (current) drug therapy: Secondary | ICD-10-CM | POA: Insufficient documentation

## 2017-03-28 DIAGNOSIS — Z888 Allergy status to other drugs, medicaments and biological substances status: Secondary | ICD-10-CM | POA: Insufficient documentation

## 2017-03-28 DIAGNOSIS — E669 Obesity, unspecified: Secondary | ICD-10-CM

## 2017-03-28 NOTE — Progress Notes (Signed)
Supervised Weight Loss:  Appt start time: 9798  End time:  1010  Pt returns having gained 2 pounds since last month. Pt states she has been attending a support group and learned about Genepro protein powder and planning to start using this week.  Surgery type: Sleeve gastrectomy Start weight at [facility]: 223 lbs on 10/28/2016 Weight today: 219 lbs Weight change: 4 pounds weight loss BMI: 37.59   Diet Recall: Oatmeal and banana and OJ sometimes Vegetable Plate (veggies or soup) Chicken alfredo OR grilled steak or shrimp Beverage: water   Preferred Learning Style:   No preference indicated   Learning Readiness:   Ready  MEDICATIONS: see list  Usual physical activity: 1 hour per day (walking dog)  Diet to Follow: 1400-1600 calories 158-170 g carbohydrates 105-112 g protein 39-42 g fat   Nutritional Diagnosis:  [place]-3.3 Overweight/obesity related to past poor dietary habits and physical inactivity as evidenced by patient in SWL for pending bariatric surgery following dietary guidelines for continued weight loss.   Intervention:  Nutrition counseling for upcoming Bariatric Surgery.  Plan: Continue to working on chewing food to applesauce consistency (in progress) Drink fluid throughout the day, avoid 15 min before 30 after meals Aim to include protein with each meal  Teaching Method Utilized:  Visual Auditory Hands on  Handouts given during visit include:  Protein  Barriers to learning/adherence to lifestyle change: none  Demonstrated degree of understanding via:  Teach Back   Monitoring/Evaluation:  Dietary intake, exercise, and body weight in 4 week(s).

## 2017-03-30 DIAGNOSIS — F603 Borderline personality disorder: Secondary | ICD-10-CM | POA: Diagnosis not present

## 2017-03-31 DIAGNOSIS — I951 Orthostatic hypotension: Secondary | ICD-10-CM | POA: Diagnosis not present

## 2017-03-31 DIAGNOSIS — I10 Essential (primary) hypertension: Secondary | ICD-10-CM | POA: Diagnosis not present

## 2017-03-31 DIAGNOSIS — F29 Unspecified psychosis not due to a substance or known physiological condition: Secondary | ICD-10-CM | POA: Diagnosis not present

## 2017-04-01 ENCOUNTER — Encounter: Payer: Self-pay | Admitting: Nurse Practitioner

## 2017-04-01 DIAGNOSIS — K6289 Other specified diseases of anus and rectum: Secondary | ICD-10-CM | POA: Diagnosis not present

## 2017-04-05 ENCOUNTER — Ambulatory Visit (INDEPENDENT_AMBULATORY_CARE_PROVIDER_SITE_OTHER): Payer: Medicare Other | Admitting: Psychiatry

## 2017-04-05 DIAGNOSIS — F509 Eating disorder, unspecified: Secondary | ICD-10-CM

## 2017-04-07 DIAGNOSIS — F29 Unspecified psychosis not due to a substance or known physiological condition: Secondary | ICD-10-CM | POA: Diagnosis not present

## 2017-04-13 DIAGNOSIS — I10 Essential (primary) hypertension: Secondary | ICD-10-CM | POA: Diagnosis not present

## 2017-04-13 DIAGNOSIS — E669 Obesity, unspecified: Secondary | ICD-10-CM | POA: Diagnosis not present

## 2017-04-14 DIAGNOSIS — F29 Unspecified psychosis not due to a substance or known physiological condition: Secondary | ICD-10-CM | POA: Diagnosis not present

## 2017-04-19 ENCOUNTER — Ambulatory Visit: Payer: Self-pay | Admitting: Nurse Practitioner

## 2017-04-21 DIAGNOSIS — F29 Unspecified psychosis not due to a substance or known physiological condition: Secondary | ICD-10-CM | POA: Diagnosis not present

## 2017-04-26 DIAGNOSIS — R0789 Other chest pain: Secondary | ICD-10-CM | POA: Diagnosis not present

## 2017-04-26 DIAGNOSIS — J90 Pleural effusion, not elsewhere classified: Secondary | ICD-10-CM | POA: Diagnosis not present

## 2017-04-26 DIAGNOSIS — J029 Acute pharyngitis, unspecified: Secondary | ICD-10-CM | POA: Diagnosis not present

## 2017-04-26 DIAGNOSIS — R079 Chest pain, unspecified: Secondary | ICD-10-CM | POA: Diagnosis not present

## 2017-04-27 ENCOUNTER — Ambulatory Visit: Payer: Self-pay | Admitting: Skilled Nursing Facility1

## 2017-04-28 DIAGNOSIS — R110 Nausea: Secondary | ICD-10-CM | POA: Diagnosis not present

## 2017-04-28 DIAGNOSIS — F29 Unspecified psychosis not due to a substance or known physiological condition: Secondary | ICD-10-CM | POA: Diagnosis not present

## 2017-04-28 DIAGNOSIS — R079 Chest pain, unspecified: Secondary | ICD-10-CM | POA: Diagnosis not present

## 2017-04-29 ENCOUNTER — Encounter: Payer: Self-pay | Admitting: Registered"

## 2017-04-29 ENCOUNTER — Encounter: Payer: Medicare Other | Attending: General Surgery | Admitting: Registered"

## 2017-04-29 DIAGNOSIS — F251 Schizoaffective disorder, depressive type: Secondary | ICD-10-CM | POA: Diagnosis not present

## 2017-04-29 DIAGNOSIS — Z888 Allergy status to other drugs, medicaments and biological substances status: Secondary | ICD-10-CM | POA: Diagnosis not present

## 2017-04-29 DIAGNOSIS — Z6839 Body mass index (BMI) 39.0-39.9, adult: Secondary | ICD-10-CM | POA: Insufficient documentation

## 2017-04-29 DIAGNOSIS — E669 Obesity, unspecified: Secondary | ICD-10-CM

## 2017-04-29 DIAGNOSIS — Z713 Dietary counseling and surveillance: Secondary | ICD-10-CM | POA: Insufficient documentation

## 2017-04-29 DIAGNOSIS — Z79899 Other long term (current) drug therapy: Secondary | ICD-10-CM | POA: Diagnosis not present

## 2017-04-29 NOTE — Progress Notes (Signed)
Supervised Weight Loss: 6th appt  Appt start time: 11:00  End time:  11:15  Pt arrives having lost 4.1 lbs from last visit. Pt states she has been sick and has fluid in her lungs. Pt reports she's had nausea, vomiting and dehydration. Pt reports she has been using protein shake in the morning and mid-afternoon to help with hunger throughout her day.  Pt states she has cut out sodas and juices.  Pt states she goes to the gym at least 3 times a week for cycling and/or treadmill. Pt states her weight loss has been due to not being able to keep anything down but as of last night she tolerated her dinner well. Pt states she has been more intentional about having a protein source at each meal.   Surgery type: Sleeve gastrectomy Start weight at [facility]: 223 lbs on 10/28/2016 Weight today: 214.9 Weight change: 4 pounds weight loss BMI: 36.89  24 hr Diet Recall:  B: Yogurt, Genepro protein powder, unsweetened almond milk  L: [place] sandwich on wheat bread, apple S: Yogurt, Genepro protein powder, unsweetened almond milk, tangerine D: Pork chops, cabbage, sliced tomatoes Beverage: water, G2 (to help with dehydration)   Preferred Learning Style:   No preference indicated   Learning Readiness:   Ready  In progress  MEDICATIONS: see list  Usual physical activity: gym 3 times per week (cycling/treadmill)  Diet to Follow: 1400-1600 calories 158-170 g carbohydrates 105-112 g protein 39-42 g fat   Nutritional Diagnosis:  [place]-3.3 Overweight/obesity related to past poor dietary habits and physical inactivity as evidenced by patient in SWL for pending bariatric surgery following dietary guidelines for continued weight loss.   Intervention:  Nutrition counseling for upcoming Bariatric Surgery.  Goals: - Find greek yogurt option with fat and sugar in single digits  - Continue to working on chewing food to applesauce consistency (in progress)  - Drink fluid throughout the day, avoid 15 min  before 30 after meals - sipping some    Teaching Method Utilized:  Visual Auditory Hands on  Handouts given during visit include:  none  Barriers to learning/adherence to lifestyle change: none  Demonstrated degree of understanding via:  Teach Back   Monitoring/Evaluation:  Dietary intake, exercise, and body weight in post-op.

## 2017-04-29 NOTE — Patient Instructions (Addendum)
-   Find greek yogurt option with fat and sugar in single digits

## 2017-05-02 ENCOUNTER — Encounter: Payer: Medicare Other | Admitting: Registered"

## 2017-05-02 ENCOUNTER — Ambulatory Visit
Admission: RE | Admit: 2017-05-02 | Discharge: 2017-05-02 | Disposition: A | Discharge status: Home / Self Care | Payer: Medicare Other | Source: Ambulatory Visit | Attending: Family Medicine | Admitting: Family Medicine

## 2017-05-02 ENCOUNTER — Other Ambulatory Visit: Payer: Self-pay | Admitting: Family Medicine

## 2017-05-02 DIAGNOSIS — Z6839 Body mass index (BMI) 39.0-39.9, adult: Secondary | ICD-10-CM | POA: Diagnosis not present

## 2017-05-02 DIAGNOSIS — Z713 Dietary counseling and surveillance: Secondary | ICD-10-CM | POA: Diagnosis not present

## 2017-05-02 DIAGNOSIS — R079 Chest pain, unspecified: Secondary | ICD-10-CM

## 2017-05-02 DIAGNOSIS — E669 Obesity, unspecified: Secondary | ICD-10-CM

## 2017-05-02 DIAGNOSIS — Z79899 Other long term (current) drug therapy: Secondary | ICD-10-CM | POA: Diagnosis not present

## 2017-05-02 DIAGNOSIS — Z888 Allergy status to other drugs, medicaments and biological substances status: Secondary | ICD-10-CM | POA: Diagnosis not present

## 2017-05-02 NOTE — Progress Notes (Signed)
  Pre-Operative Nutrition Class:  Appt start time: 8:15   End time:  9:15  Patient was seen on 05/02/2017 for Pre-Operative Bariatric Surgery Education at [facility].   Surgery date: 05/24/2017 Surgery type: Sleeve gastrectomy Start weight at [facility]: 222.1 Weight today: 211.1  TANITA  BODY COMP RESULTS  Samples given per MNT protocol. Patient educated on appropriate usage: Opurity Multivitamin Lot # [id number] Exp: 04/19  Bariatric Advantage Calcium Citrate (strawberry) Lot #[id number] Exp: 06/15/17  Bariatric Advantage Calcium Citrate (caramel)  Lot # [id number] Exp: 07/08/17  Unjury Protein Shake Lot # [id number] Exp: 10/30/17    The following the learning objectives were met by the patient during this course:  Identify Pre-Op Dietary Goals and will begin 2 weeks pre-operatively  Identify appropriate sources of fluids and proteins   State protein recommendations and appropriate sources pre and post-operatively  Identify Post-Operative Dietary Goals and will follow for 2 weeks post-operatively  Identify appropriate multivitamin and calcium sources  Describe the need for physical activity post-operatively and will follow MD recommendations  State when to call healthcare provider regarding medication questions or post-operative complications  Handouts given during class include:  Pre-Op Bariatric Surgery Diet Handout  Protein Shake Handout  Post-Op Bariatric Surgery Nutrition Handout  BELT Program Information Flyer  Support Group Information Flyer  [facility] Bariatric Supplements Price List  Follow-Up Plan: Patient will follow-up at [facility] 2 weeks post operatively for diet advancement per MD.

## 2017-05-05 ENCOUNTER — Ambulatory Visit (INDEPENDENT_AMBULATORY_CARE_PROVIDER_SITE_OTHER): Payer: Medicare Other | Admitting: Psychiatry

## 2017-05-05 DIAGNOSIS — F509 Eating disorder, unspecified: Secondary | ICD-10-CM | POA: Diagnosis not present

## 2017-05-06 DIAGNOSIS — F29 Unspecified psychosis not due to a substance or known physiological condition: Secondary | ICD-10-CM | POA: Diagnosis not present
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
## 2017-05-10 ENCOUNTER — Ambulatory Visit (INDEPENDENT_AMBULATORY_CARE_PROVIDER_SITE_OTHER): Payer: Medicare Other | Admitting: Physician Assistant

## 2017-05-10 ENCOUNTER — Encounter: Payer: Self-pay | Admitting: Physician Assistant

## 2017-05-10 VITALS — BP 100/80 | HR 66 | Ht 64.0 in | Wt 209.0 lb

## 2017-05-10 DIAGNOSIS — E119 Type 2 diabetes mellitus without complications: Secondary | ICD-10-CM

## 2017-05-10 DIAGNOSIS — I10 Essential (primary) hypertension: Secondary | ICD-10-CM | POA: Diagnosis not present

## 2017-05-10 DIAGNOSIS — R079 Chest pain, unspecified: Secondary | ICD-10-CM

## 2017-05-10 DIAGNOSIS — R0602 Shortness of breath: Secondary | ICD-10-CM | POA: Diagnosis not present

## 2017-05-10 DIAGNOSIS — Z86711 Personal history of pulmonary embolism: Secondary | ICD-10-CM

## 2017-05-10 DIAGNOSIS — F319 Bipolar disorder, unspecified: Secondary | ICD-10-CM

## 2017-05-10 DIAGNOSIS — K219 Gastro-esophageal reflux disease without esophagitis: Secondary | ICD-10-CM

## 2017-05-10 LAB — BASIC METABOLIC PANEL
BUN/Creatinine Ratio: 13 (ref 9–23)
BUN: 14 mg/dL (ref 6–24)
CHLORIDE: 100 mmol/L (ref 96–106)
CO2: 24 mmol/L (ref 18–29)
CREATININE: 1.08 mg/dL — AB (ref 0.57–1.00)
Calcium: 9.9 mg/dL (ref 8.7–10.2)
GFR calc Af Amer: 71 mL/min/{1.73_m2} (ref >59)
GFR calc non Af Amer: 62 mL/min/{1.73_m2} (ref >59)
GLUCOSE: 106 mg/dL — AB (ref 65–99)
Potassium: 4.7 mmol/L (ref 3.5–5.2)
SODIUM: 139 mmol/L (ref 134–144)

## 2017-05-10 LAB — PRO B NATRIURETIC PEPTIDE: NT-Pro BNP: 133 pg/mL (ref 0–249)

## 2017-05-10 NOTE — Patient Instructions (Addendum)
Medication Instructions:  Your physician recommends that you continue on your current medications as directed. Please refer to the Current Medication list given to you today.   Labwork: TODAY:  BMET & PRO BNP  Testing/Procedures: Your physician has requested that you have an echocardiogram. Echocardiography is a painless test that uses sound waves to create images of your heart. It provides your doctor with information about the size and shape of your heart and how well your heart's chambers and valves are working. This procedure takes approximately one hour. There are no restrictions for this procedure.   Your physician has requested that you have en exercise stress myoview. For further information please visit [email or web address]. Please follow instruction sheet, as given.   Follow-Up: Your physician recommends that you schedule a follow-up appointment in: PENDING TEST RESULTS   Any Other Special Instructions Will Be Listed Below (If Applicable).  Echocardiogram An echocardiogram, or echocardiography, uses sound waves (ultrasound) to produce an image of your heart. The echocardiogram is simple, painless, obtained within a short period of time, and offers valuable information to your health care provider. The images from an echocardiogram can provide information such as:  Evidence of coronary artery disease (CAD).  Heart size.  Heart muscle function.  Heart valve function.  Aneurysm detection.  Evidence of a past heart attack.  Fluid buildup around the heart.  Heart muscle thickening.  Assess heart valve function. Tell a health care provider about:  Any allergies you have.  All medicines you are taking, including vitamins, herbs, eye drops, creams, and over-the-counter medicines.  Any problems you or family members have had with anesthetic medicines.  Any blood disorders you have.  Any surgeries you have had.  Any medical conditions you have.  Whether you are  pregnant or may be pregnant. What happens before the procedure? No special preparation is needed. Eat and drink normally. What happens during the procedure?  In order to produce an image of your heart, gel will be applied to your chest and a wand-like tool (transducer) will be moved over your chest. The gel will help transmit the sound waves from the transducer. The sound waves will harmlessly bounce off your heart to allow the heart images to be captured in real-time motion. These images will then be recorded.  You may need an IV to receive a medicine that improves the quality of the pictures. What happens after the procedure? You may return to your normal schedule including diet, activities, and medicines, unless your health care provider tells you otherwise. This information is not intended to replace advice given to you by your health care provider. Make sure you discuss any questions you have with your health care provider. Document Released: 12/10/2000 Document Revised: 07/31/2016 Document Reviewed: 08/20/2013 Elsevier Interactive Patient Education  2017 [place].    Exercise Stress Electrocardiogram An exercise stress electrocardiogram is a test to check how blood flows to your heart. It is done to find areas of poor blood flow. You will need to walk on a treadmill for this test. The electrocardiogram will record your heartbeat when you are at rest and when you are exercising. What happens before the procedure?  Do not have drinks with caffeine or foods with caffeine for 24 hours before the test, or as told by your doctor. This includes coffee, tea (even decaf tea), sodas, chocolate, and cocoa.  Follow your doctor's instructions about eating and drinking before the test.  Ask your doctor what medicines you should or should not  take before the test. Take your medicines with water unless told by your doctor not to.  If you use an inhaler, bring it with you to the test.  Bring a  snack to eat after the test.  Do not  smoke for 4 hours before the test.  Do not put lotions, powders, creams, or oils on your chest before the test.  Wear comfortable shoes and clothing. What happens during the procedure?  You will have patches put on your chest. Small areas of your chest may need to be shaved. Wires will be connected to the patches.  Your heart rate will be watched while you are resting and while you are exercising.  You will walk on the treadmill. The treadmill will slowly get faster to raise your heart rate.  The test will take about 1-2 hours. What happens after the procedure?  Your heart rate and blood pressure will be watched after the test.  You may return to your normal diet, activities, and medicines or as told by your doctor. This information is not intended to replace advice given to you by your health care provider. Make sure you discuss any questions you have with your health care provider. Document Released: 05/31/2008 Document Revised: 08/11/2016 Document Reviewed: 08/20/2013 Elsevier Interactive Patient Education  2017 [employer].   If you need a refill on your cardiac medications before your next appointment, please call your pharmacy.

## 2017-05-11 ENCOUNTER — Telehealth (HOSPITAL_COMMUNITY): Payer: Self-pay | Admitting: *Deleted

## 2017-05-11 DIAGNOSIS — Z6838 Body mass index (BMI) 38.0-38.9, adult: Secondary | ICD-10-CM | POA: Diagnosis not present

## 2017-05-11 DIAGNOSIS — F603 Borderline personality disorder: Secondary | ICD-10-CM | POA: Diagnosis not present

## 2017-05-11 DIAGNOSIS — E669 Obesity, unspecified: Secondary | ICD-10-CM | POA: Diagnosis not present

## 2017-05-11 NOTE — Telephone Encounter (Signed)
Patient given detailed instructions per Myocardial Perfusion Study Information Sheet for the test on  05/16/17 Patient notified to arrive 15 minutes early and that it is imperative to arrive on time for appointment to keep from having the test rescheduled.  If you need to cancel or reschedule your appointment, please call the office within 24 hours of your appointment. Failure to do so may result in a cancellation of your appointment, and a $50 no show fee. Patient verbalized understanding. Kirstie Peri

## 2017-05-12 ENCOUNTER — Other Ambulatory Visit (INDEPENDENT_AMBULATORY_CARE_PROVIDER_SITE_OTHER): Payer: Medicare Other

## 2017-05-12 ENCOUNTER — Encounter: Payer: Self-pay | Admitting: Pulmonary Disease

## 2017-05-12 ENCOUNTER — Ambulatory Visit (INDEPENDENT_AMBULATORY_CARE_PROVIDER_SITE_OTHER): Payer: Medicare Other | Admitting: Pulmonary Disease

## 2017-05-12 VITALS — BP 98/68 | HR 60 | Ht 63.0 in | Wt 205.8 lb

## 2017-05-12 DIAGNOSIS — J90 Pleural effusion, not elsewhere classified: Secondary | ICD-10-CM

## 2017-05-12 DIAGNOSIS — R0609 Other forms of dyspnea: Secondary | ICD-10-CM

## 2017-05-12 DIAGNOSIS — F29 Unspecified psychosis not due to a substance or known physiological condition: Secondary | ICD-10-CM | POA: Diagnosis not present

## 2017-05-12 LAB — CBC WITH DIFFERENTIAL/PLATELET
BASOS PCT: 0.8 % (ref 0.0–3.0)
Basophils Absolute: 0.1 10*3/uL (ref 0.0–0.1)
EOS PCT: 1.4 % (ref 0.0–5.0)
Eosinophils Absolute: 0.1 10*3/uL (ref 0.0–0.7)
HEMATOCRIT: 39.9 % (ref 36.0–46.0)
HEMOGLOBIN: 13.1 g/dL (ref 12.0–15.0)
LYMPHS PCT: 21.6 % (ref 12.0–46.0)
Lymphs Abs: 1.8 10*3/uL (ref 0.7–4.0)
MCHC: 32.7 g/dL (ref 30.0–36.0)
MCV: 83.1 fl (ref 78.0–100.0)
MONO ABS: 0.6 10*3/uL (ref 0.1–1.0)
Monocytes Relative: 7.9 % (ref 3.0–12.0)
NEUTROS ABS: 5.6 10*3/uL (ref 1.4–7.7)
Neutrophils Relative %: 68.3 % (ref 43.0–77.0)
Platelets: 313 10*3/uL (ref 150.0–400.0)
RBC: 4.8 Mil/uL (ref 3.87–5.11)
RDW: 14.5 % (ref 11.5–15.5)
WBC: 8.2 10*3/uL (ref 4.0–10.5)

## 2017-05-12 LAB — COMPREHENSIVE METABOLIC PANEL
ALBUMIN: 4.5 g/dL (ref 3.5–5.2)
ALT: 10 U/L (ref 0–35)
AST: 10 U/L (ref 0–37)
Alkaline Phosphatase: 100 U/L (ref 39–117)
BUN: 19 mg/dL (ref 6–23)
CALCIUM: 9.7 mg/dL (ref 8.4–10.5)
CHLORIDE: 104 meq/L (ref 96–112)
CO2: 29 mEq/L (ref 19–32)
Creatinine, Ser: 1.14 mg/dL (ref 0.40–1.20)
GFR: 54.47 mL/min — AB (ref >60.00)
Glucose, Bld: 99 mg/dL (ref 70–99)
POTASSIUM: 3.9 meq/L (ref 3.5–5.1)
Sodium: 140 mEq/L (ref 135–145)
Total Bilirubin: 0.4 mg/dL (ref 0.2–1.2)
Total Protein: 7.3 g/dL (ref 6.0–8.3)

## 2017-05-12 LAB — SEDIMENTATION RATE: SED RATE: 18 mm/h (ref 0–20)

## 2017-05-12 MED ORDER — PREDNISONE 20 MG PO TABS: 40.0000 mg | ORAL_TABLET | Freq: Every day | ORAL | 1 refills | Status: DC

## 2017-05-12 NOTE — Patient Instructions (Signed)
Lab tests today Will schedule pulmonary function test Prednisone 40 mg daily Follow up in 1 week with Dr. Halford Chessman or Nurse Practitioner - you will need chest xray at follow up appointment

## 2017-05-12 NOTE — Progress Notes (Signed)
   Subjective:    Patient ID: [name] [name], female    DOB: Jun 13, 1971, 46 y.o.   MRN: [id number]  HPI    Review of Systems  Constitutional: Positive for unexpected weight change. Negative for fever.  HENT: Positive for sore throat. Negative for congestion, dental problem, ear pain, nosebleeds, postnasal drip, rhinorrhea, sinus pressure, sneezing and trouble swallowing.   Eyes: Negative for redness and itching.  Respiratory: Positive for shortness of breath. Negative for cough, chest tightness and wheezing.   Cardiovascular: Positive for chest pain. Negative for palpitations ( irregular heartbeat) and leg swelling.  Gastrointestinal: Negative for nausea and vomiting.  Genitourinary: Negative for dysuria.  Musculoskeletal: Positive for arthralgias. Negative for joint swelling.  Skin: Negative for rash.  Neurological: Negative for headaches.  Hematological: Does not bruise/bleed easily.  Psychiatric/Behavioral: Positive for dysphoric mood. The patient is nervous/anxious.        Objective:   Physical Exam        Assessment & Plan:

## 2017-05-12 NOTE — Progress Notes (Signed)
Please place orders in EPIC as patient has a pre-op appointment on 05/18/17 ! Thank you!

## 2017-05-12 NOTE — Progress Notes (Signed)
Past surgical history She  has a past surgical history that includes Other surgical history; Nephrolithotomy (03/31/2012); Tubal ligation; laparotomy (07/31/2012); Colostomy (07/31/2012); Colostomy reversal; Colostomy closure (N/A, 02/15/2013); Dilatation & curettage/hysteroscopy with trueclear (N/A, 10/24/2013); Breast Reduction (Left, 06/2014); Incisional hernia repair (N/A, 12/24/2015); Insertion of mesh (N/A, 12/24/2015); Abdominal surgery (2013); and Excision basal cell carcinoma (06/2016).  Family history Her family history includes Depression in her mother; Healthy in her daughter, sister, and son; Hypertension in her mother; Kidney cancer in her mother; Other in her mother.  Social history She  reports that she has never smoked. She has never used smokeless tobacco. She reports that she does not drink alcohol or use drugs.  Allergies  Allergen Reactions  . Augmentin [Amoxicillin-Pot Clavulanate] Itching, Swelling, Rash and Other (See Comments)    Has patient had a PCN reaction causing immediate rash, facial/tongue/throat swelling, SOB or lightheadedness with hypotension: Yes Has patient had a PCN reaction causing severe rash involving mucus membranes or skin necrosis: No Has patient had a PCN reaction that required hospitalization No Has patient had a PCN reaction occurring within the last 10 years: Yes If all of the above answers are "NO", then may proceed with Cephalosporin use.  . Enoxaparin Hives  . Avelox [Moxifloxacin Hcl In Nacl] Itching, Swelling, Rash and Other (See Comments)  . Penicillins Itching, Swelling and Rash    Has patient had a PCN reaction causing immediate rash, facial/tongue/throat swelling, SOB or lightheadedness with hypotension: Yes Has patient had a PCN reaction causing severe rash involving mucus membranes or skin necrosis: No Has patient had a PCN reaction that required hospitalization: Already in hospital when reaction happened Has patient had a PCN reaction  occurring within the last 10 years: Unknown If all of the above answers are "NO", then may proceed with Cephalosporin use.   . Sodium Hydroxide Rash    Review of Systems  Constitutional: Positive for unexpected weight change. Negative for fever.  HENT: Positive for sore throat. Negative for congestion, dental problem, ear pain, nosebleeds, postnasal drip, rhinorrhea, sinus pressure, sneezing and trouble swallowing.   Eyes: Negative for redness and itching.  Respiratory: Positive for shortness of breath. Negative for cough, chest tightness and wheezing.   Cardiovascular: Positive for chest pain. Negative for palpitations ( irregular heartbeat) and leg swelling.  Gastrointestinal: Negative for nausea and vomiting.  Genitourinary: Negative for dysuria.  Musculoskeletal: Positive for arthralgias. Negative for joint swelling.  Skin: Negative for rash.  Neurological: Negative for headaches.  Hematological: Does not bruise/bleed easily.  Psychiatric/Behavioral: Positive for dysphoric mood. The patient is nervous/anxious.     Current Outpatient Prescriptions on File Prior to Visit  Medication Sig  . busPIRone (BUSPAR) 10 MG tablet Take 10 mg by mouth 3 (three) times daily.  . furosemide (LASIX) 20 MG tablet Take 20 mg by mouth daily.  . meloxicam (MOBIC) 15 MG tablet Take 15 mg by mouth daily as needed for pain.  . metFORMIN (GLUCOPHAGE-XR) 500 MG 24 hr tablet Take 500 mg by mouth 2 (two) times daily.  . metoprolol tartrate (LOPRESSOR) 25 MG tablet Take 25 mg by mouth 2 (two) times daily.  [name] omeprazole (PRILOSEC) 20 MG capsule Take 40 mg by mouth daily with breakfast.   . ondansetron (ZOFRAN) 24 MG tablet Take 24 mg by mouth 3 (three) times daily as needed for nausea.   . traMADol-acetaminophen (ULTRACET) 37.5-325 MG tablet Take 1 tablet by mouth every 6 (six) hours as needed.  . traZODone (DESYREL) 100 MG tablet  Take 200 mg by mouth at bedtime. TO HELP WITH SLEEP   No current  facility-administered medications on file prior to visit.     Chief Complaint  Patient presents with  . PULMONARY CONSULT    Referred by Dr Maurice Small for pleural effusion. Pt c/o SOB x 2-3 weeks.     Pulmonary tests CT angio chest 03/09/17 >> small b/l effusions CT angio chest 04/26/17 >> small b/l effusions  Sleep tests PSG 09/22/16 >> AHI 0.6, SaO2 low 90%  Past medical history She  has a past medical history of Anxiety; Basal cell carcinoma; Blood dyscrasia; Diabetes mellitus without complication ([employer]); Diarrhea, functional; Diverticulitis; H/O eating disorder; H/O: attempted suicide; Headache; Heart murmur; pulmonary embolism (2013); Hyperlipidemia; Hypertension; Iron deficiency anemia, unspecified (11/15/2013); Kidney stone; Major depressive disorder, recurrent, severe without psychotic features ([place]); Paranoid schizophrenia ([place]); Personality disorder; Pituitary adenoma ([place]); Renal insufficiency; Sleep disorder breathing; and Vitamin D insufficiency.  Vital signs BP 98/68 (BP Location: Right Arm, Cuff Size: Normal)   Pulse 60   Ht _0  (1.6 m)   Wt 205 lb 12.8 oz (93.4 kg)   SpO2 98%   BMI 36.46 kg/m   History of present illness MARCENA DIAS is a 46 y.o. female with pleural effusion.  She developed pleuritic chest pain on the left and shortness of breath about 3 weeks ago.  She does not recall anything different that could have triggered this.  She feels the discomfort on her left side.  It hurts when she takes a deep breath.  She has dry cough.  She denies fever, or hemoptysis.  She has not had skin rash, or joint swelling.  No difficulty with swallowing.  No change in vision.  She was tried on antibiotics, but no improvement.  She was started on lasix, and again no improvement.  She was seen by cardiology and has Echo and stress test set up.  She is supposed to have bariatric surgery on May 29.    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  She never  smoked.  No history of pneumonia or tuberculosis.  No recent travel or sick exposures.  Her mother has arthritis all over her and sees a rheumatologist, but she is not sure what type of arthritis her mother has.  In review of records she was seen in ER on 03/09/17 with rash on arms, face, and chest associated with chest pain.  CT angio chest then showed small b/l effusions.  She was tx with epi injection and prednisone for presumed allergic reaction.  Physical exam  General - No distress ENT - No sinus tenderness, no oral exudate, no LAN, no thyromegaly, TM clear, pupils equal/reactive Cardiac - s1s2 regular, no murmur, pulses symmetric Chest - No wheeze/rales/dullness, good air entry, normal respiratory excursion Back - No focal tenderness Abd - Soft, non-tender, no organomegaly, + bowel sounds Ext - No edema Neuro - Normal strength, cranial nerves intact Skin - No rashes Psych - Normal mood, and behavior   CMP Latest Ref Rng & Units 05/10/2017 03/09/2017 11/02/2016  Glucose 65 - 99 mg/dL 106(H) 210(H) 139(H)  BUN 6 - 24 mg/dL _1 Creatinine 0.57 - 1.00 mg/dL 1.08(H) 0.98 0.99  Sodium 134 - 144 mmol/L 139 137 137  Potassium 3.5 - 5.2 mmol/L 4.7 3.8 4.3  Chloride 96 - 106 mmol/L 100 107 103  CO2 18 - 29 mmol/L _2 Calcium 8.7 - 10.2 mg/dL 9.9 9.9 9.4  Total Protein 6.5 - 8.1 g/dL - - -  Total Bilirubin 0.3 - 1.2 mg/dL - - -  Alkaline Phos 38 - 126 U/L - - -  AST 15 - 41 U/L - - -  ALT 14 - 54 U/L - - -     CBC Latest Ref Rng & Units 03/09/2017 11/02/2016 07/02/2016  WBC 4.0 - 10.5 K/uL 15.6(H) 8.8 8.7  Hemoglobin 12.0 - 15.0 g/dL 13.0 12.6 12.7  Hematocrit 36.0 - 46.0 % 39.6 39.0 39.7  Platelets 150 - 400 K/uL 363 355 326     ABG    Component Value Date/Time   PHART 7.445 05/30/2015 2205   PCO2ART 30.9 (L) 05/30/2015 2205   PO2ART 104.0 (H) 05/30/2015 2205   HCO3 20.9 10/14/2015 1631   TCO2 22 10/14/2015 1631   ACIDBASEDEF 2.0 10/14/2015 1631   O2SAT 96.0  10/14/2015 1631     Discussion She developed left sided pleuritic chest pain associated with dyspnea over the past few weeks.  She has been tried on antibiotics and diuretics w/o improvement.  There is nothing on radiographs to suggest a malignant process.  Her effusions are too small to warrant thoracentesis.  Her symptoms are consistent with an inflammatory process causing her dyspnea and pleural effusions.  Assessment/plan  Pleuritis with small Lt > Rt pleural effusions. - will check labs, including ESR and serology - will arrange for pulmonary function test - will give her trial of prednisone 40 mg daily - f/u in 1 week with chest xray - f/u with cardiac evaluation  Obesity. - advised her to discuss with her surgeon about whether she should have delay in her scheduled bariatric surgery  History of diabetes. - advised her to follow up with her primary care to monitor blood sugar while on prednisone   Patient Instructions  Lab tests today Will schedule pulmonary function test Prednisone 40 mg daily Follow up in 1 week with Dr. Halford Chessman or Nurse Practitioner - you will need chest xray at follow up appointment    Chesley Mires, MD [place] Pager:  [phone] 05/12/2017, 10:41 AM

## 2017-05-13 LAB — ANA W/REFLEX IF POSITIVE: ANA: NEGATIVE

## 2017-05-13 LAB — RHEUMATOID FACTOR

## 2017-05-13 LAB — ANCA SCREEN W REFLEX TITER: ANCA Screen: NEGATIVE

## 2017-05-13 NOTE — Patient Instructions (Addendum)
[name] [name]  05/13/2017   Your procedure is scheduled on: 05/24/2017   Report to [facility] Main  Entrance Take [place]  elevators to 3rd floor to  [place] at    [place] AM.    Call this number if you have problems the morning of surgery [phone]    Remember: ONLY 1 PERSON MAY GO WITH YOU TO SHORT STAY TO GET  READY MORNING OF YOUR SURGERY.  Do not eat food or drink liquids :After Midnight.     Take these medicines the morning of surgery with A SIP OF WATER: Buspar, Metoprolol ( Lopressor)  Omeprazole ( Prilosec) DO NOT TAKE ANY DIABETIC MEDICATIONS DAY OF YOUR SURGERY                               You may not have any metal on your body including hair pins and              piercings  Do not wear jewelry, make-up, lotions, powders or perfumes, deodorant             Do not wear nail polish.  Do not shave  48 hours prior to surgery.     Do not bring valuables to the hospital. [place].  Contacts, dentures or bridgework may not be worn into surgery.  Leave suitcase in the car. After surgery it may be brought to your room.                   Please read over the following fact sheets you were given: _____________________________________________________________________             [facility] - Preparing for Surgery Before surgery, you can play an important role.  Because skin is not sterile, your skin needs to be as free of germs as possible.  You can reduce the number of germs on your skin by washing with CHG (chlorahexidine gluconate) soap before surgery.  CHG is an antiseptic cleaner which kills germs and bonds with the skin to continue killing germs even after washing. Please DO NOT use if you have an allergy to CHG or antibacterial soaps.  If your skin becomes reddened/irritated stop using the CHG and inform your nurse when you arrive at Short Stay. Do not shave (including legs and  underarms) for at least 48 hours prior to the first CHG shower.  You may shave your face/neck. Please follow these instructions carefully:  1.  Shower with CHG Soap the night before surgery and the  morning of Surgery.  2.  If you choose to wash your hair, wash your hair first as usual with your  normal  shampoo.  3.  After you shampoo, rinse your hair and body thoroughly to remove the  shampoo.                           4.  Use CHG as you would any other liquid soap.  You can apply chg directly  to the skin and wash                       Gently with a scrungie or clean washcloth.  5.  Apply the CHG Soap to your body ONLY FROM THE NECK DOWN.   Do not use on face/ open                           Wound or open sores. Avoid contact with eyes, ears mouth and genitals (private parts).                       Wash face,  Genitals (private parts) with your normal soap.             6.  Wash thoroughly, paying special attention to the area where your surgery  will be performed.  7.  Thoroughly rinse your body with warm water from the neck down.  8.  DO NOT shower/wash with your normal soap after using and rinsing off  the CHG Soap.                9.  Pat yourself dry with a clean towel.            10.  Wear clean pajamas.            11.  Place clean sheets on your bed the night of your first shower and do not  sleep with pets. Day of Surgery : Do not apply any lotions/deodorants the morning of surgery.  Please wear clean clothes to the hospital/surgery center.  FAILURE TO FOLLOW THESE INSTRUCTIONS MAY RESULT IN THE CANCELLATION OF YOUR SURGERY PATIENT SIGNATURE_________________________________  NURSE SIGNATURE__________________________________  ________________________________________________________________________ How to Manage Your Diabetes Before and After Surgery  Why is it important to control my blood sugar before and after surgery? . Improving blood sugar levels before and after surgery helps  healing and can limit problems. . A way of improving blood sugar control is eating a healthy diet by: o  Eating less sugar and carbohydrates o  Increasing activity/exercise o  Talking with your doctor about reaching your blood sugar goals . High blood sugars (greater than 180 mg/dL) can raise your risk of infections and slow your recovery, so you will need to focus on controlling your diabetes during the weeks before surgery. . Make sure that the doctor who takes care of your diabetes knows about your planned surgery including the date and location.  How do I manage my blood sugar before surgery? . Check your blood sugar at least 4 times a day, starting 2 days before surgery, to make sure that the level is not too high or low. o Check your blood sugar the morning of your surgery when you wake up and every 2 hours until you get to the Short Stay unit. . If your blood sugar is less than 70 mg/dL, you will need to treat for low blood sugar: o Do not take insulin. o Treat a low blood sugar (less than 70 mg/dL) with  cup of clear juice (cranberry or apple), 4 glucose tablets, OR glucose gel. o Recheck blood sugar in 15 minutes after treatment (to make sure it is greater than 70 mg/dL). If your blood sugar is not greater than 70 mg/dL on recheck, call [phone] for further instructions. . Report your blood sugar to the short stay nurse when you get to Short Stay.  . If you are admitted to the hospital after surgery: o Your blood sugar will be checked by the staff and you will probably be given insulin after surgery (instead of oral diabetes  medicines) to make sure you have good blood sugar levels. o The goal for blood sugar control after surgery is 80-180 mg/dL.   WHAT DO I DO ABOUT MY DIABETES MEDICATION?  [name] Do not take oral diabetes medicines (pills) the morning of surgery.  . THE NIGHT BEFORE SURGERY, take     units of       insulin.       . THE MORNING OF SURGERY, take   units of          insulin.  . The day of surgery, do not take other diabetes injectables, including Byetta (exenatide), Bydureon (exenatide ER), Victoza (liraglutide), or Trulicity (dulaglutide).  . If your CBG is greater than 220 mg/dL, you may take  of your sliding scale  . (correction) dose of insulin.    For patients with insulin pumps: Contact your diabetes doctor for specific instructions before surgery. Decrease basal rates by 20% at midnight the night before your surgery. Note that if your surgery is planned to be longer than 2 hours, your insulin pump will be removed and intravenous (IV) insulin will be started and managed by the nurses and the anesthesiologist. You will be able to restart your insulin pump once you are awake and able to manage it.  Make sure to bring insulin pump supplies to the hospital with you in case the  site needs to be changed.  Patient Signature:  Date:   Nurse Signature:  Date:   Reviewed and Endorsed by [facility] Patient Education Committee, August 2015

## 2017-05-16 ENCOUNTER — Ambulatory Visit (HOSPITAL_COMMUNITY): Payer: Medicare Other | Attending: Cardiology

## 2017-05-16 ENCOUNTER — Ambulatory Visit (INDEPENDENT_AMBULATORY_CARE_PROVIDER_SITE_OTHER): Payer: Medicare Other | Admitting: Pulmonary Disease

## 2017-05-16 DIAGNOSIS — R0609 Other forms of dyspnea: Secondary | ICD-10-CM

## 2017-05-16 DIAGNOSIS — R079 Chest pain, unspecified: Secondary | ICD-10-CM | POA: Diagnosis not present

## 2017-05-16 DIAGNOSIS — R9439 Abnormal result of other cardiovascular function study: Secondary | ICD-10-CM | POA: Insufficient documentation

## 2017-05-16 DIAGNOSIS — J90 Pleural effusion, not elsewhere classified: Secondary | ICD-10-CM

## 2017-05-16 LAB — PULMONARY FUNCTION TEST
DL/VA % PRED: 113 %
DL/VA: 5.21 ml/min/mmHg/L
DLCO COR: 25.97 ml/min/mmHg
DLCO UNC % PRED: 113 %
DLCO UNC: 25.13 ml/min/mmHg
DLCO cor % pred: 116 %
FEF 25-75 POST: 2.82 L/s
FEF 25-75 PRE: 2.61 L/s
FEF2575-%CHANGE-POST: 7 %
FEF2575-%PRED-POST: 99 %
FEF2575-%PRED-PRE: 92 %
FEV1-%Change-Post: 2 %
FEV1-%PRED-POST: 104 %
FEV1-%Pred-Pre: 102 %
FEV1-Post: 2.87 L
FEV1-Pre: 2.81 L
FEV1FVC-%CHANGE-POST: 3 %
FEV1FVC-%PRED-PRE: 99 %
FEV6-%CHANGE-POST: -1 %
FEV6-%Pred-Post: 102 %
FEV6-%Pred-Pre: 104 %
FEV6-PRE: 3.49 L
FEV6-Post: 3.45 L
FEV6FVC-%CHANGE-POST: 0 %
FEV6FVC-%Pred-Post: 102 %
FEV6FVC-%Pred-Pre: 102 %
FVC-%Change-Post: -1 %
FVC-%Pred-Post: 100 %
FVC-%Pred-Pre: 102 %
FVC-Post: 3.45 L
FVC-Pre: 3.5 L
POST FEV1/FVC RATIO: 83 %
PRE FEV1/FVC RATIO: 80 %
Post FEV6/FVC ratio: 100 %
Pre FEV6/FVC Ratio: 100 %
RV % pred: 117 %
RV: 1.93 L
TLC % pred: 107 %
TLC: 5.19 L

## 2017-05-16 LAB — MYOCARDIAL PERFUSION IMAGING
CHL CUP NUCLEAR SRS: 4
CHL CUP NUCLEAR SSS: 6
CHL CUP RESTING HR STRESS: 67 {beats}/min
CHL RATE OF PERCEIVED EXERTION: 17
CSEPEDS: 31 s
CSEPEW: 7 METS
Exercise duration (min): 5 min
LV dias vol: 121 mL (ref 46–106)
LVSYSVOL: 49 mL
MPHR: 174 {beats}/min
NUC STRESS TID: 1.14
Peak HR: 166 {beats}/min
Percent HR: 95 %
RATE: 0.33
SDS: 2

## 2017-05-16 MED ORDER — TECHNETIUM TC 99M TETROFOSMIN IV KIT
30.3000 | PACK | Freq: Once | INTRAVENOUS | Status: AC | PRN
  Administered 2017-05-16: 30.3 via INTRAVENOUS
  Filled 2017-05-16: qty 31

## 2017-05-16 MED ORDER — TECHNETIUM TC 99M TETROFOSMIN IV KIT
10.6000 | PACK | Freq: Once | INTRAVENOUS | Status: AC | PRN
  Administered 2017-05-16: 10.6 via INTRAVENOUS
  Filled 2017-05-16: qty 11

## 2017-05-16 NOTE — Progress Notes (Signed)
PFT done today. 

## 2017-05-18 ENCOUNTER — Encounter (HOSPITAL_COMMUNITY)
Admission: RE | Admit: 2017-05-18 | Discharge: 2017-05-18 | Disposition: A | Discharge status: Home / Self Care | Payer: Medicare Other | Source: Ambulatory Visit | Attending: General Surgery | Admitting: General Surgery

## 2017-05-18 ENCOUNTER — Encounter (HOSPITAL_COMMUNITY): Payer: Self-pay

## 2017-05-18 DIAGNOSIS — E119 Type 2 diabetes mellitus without complications: Secondary | ICD-10-CM | POA: Diagnosis not present

## 2017-05-18 DIAGNOSIS — Z6836 Body mass index (BMI) 36.0-36.9, adult: Secondary | ICD-10-CM | POA: Insufficient documentation

## 2017-05-18 DIAGNOSIS — R079 Chest pain, unspecified: Secondary | ICD-10-CM | POA: Insufficient documentation

## 2017-05-18 DIAGNOSIS — Z01812 Encounter for preprocedural laboratory examination: Secondary | ICD-10-CM | POA: Insufficient documentation

## 2017-05-18 HISTORY — DX: Personal history of urinary calculi: Z87.442

## 2017-05-18 HISTORY — DX: Gastro-esophageal reflux disease without esophagitis: K21.9

## 2017-05-18 HISTORY — DX: Unspecified osteoarthritis, unspecified site: M19.90

## 2017-05-18 HISTORY — DX: Personal history of other medical treatment: Z92.89

## 2017-05-18 LAB — GLUCOSE, CAPILLARY: Glucose-Capillary: 161 mg/dL — ABNORMAL HIGH (ref 65–99)

## 2017-05-18 LAB — HCG, SERUM, QUALITATIVE: Preg, Serum: NEGATIVE

## 2017-05-18 NOTE — Progress Notes (Addendum)
05/10/17-ekg-epic Stress Test 05/16/17-epic  05/10/17-LOV-cardiology-epic 05/12/17-LOV-pulm-epic  05/12/17-CMP and CBC/DIFF-epic  CXR-05/19/17-epic  LOV- Pulm-05/19/17-epic

## 2017-05-19 ENCOUNTER — Ambulatory Visit (INDEPENDENT_AMBULATORY_CARE_PROVIDER_SITE_OTHER)
Admission: RE | Admit: 2017-05-19 | Discharge: 2017-05-19 | Disposition: A | Discharge status: Home / Self Care | Payer: Medicare Other | Source: Ambulatory Visit | Attending: Acute Care | Admitting: Acute Care

## 2017-05-19 ENCOUNTER — Encounter: Payer: Self-pay | Admitting: Acute Care

## 2017-05-19 ENCOUNTER — Ambulatory Visit (INDEPENDENT_AMBULATORY_CARE_PROVIDER_SITE_OTHER): Payer: Medicare Other | Admitting: Acute Care

## 2017-05-19 VITALS — BP 100/64 | HR 51 | Ht 63.0 in | Wt 206.6 lb

## 2017-05-19 DIAGNOSIS — R0602 Shortness of breath: Secondary | ICD-10-CM

## 2017-05-19 DIAGNOSIS — R091 Pleurisy: Secondary | ICD-10-CM | POA: Diagnosis not present

## 2017-05-19 DIAGNOSIS — F29 Unspecified psychosis not due to a substance or known physiological condition: Secondary | ICD-10-CM | POA: Diagnosis not present

## 2017-05-19 DIAGNOSIS — J90 Pleural effusion, not elsewhere classified: Secondary | ICD-10-CM | POA: Diagnosis not present

## 2017-05-19 LAB — HEMOGLOBIN A1C
Hgb A1c MFr Bld: 5.5 % (ref 4.8–5.6)
Mean Plasma Glucose: 111 mg/dL

## 2017-05-19 NOTE — Progress Notes (Signed)
CXR done 05/19/17 routed via epic to Dr Kieth Brightly.

## 2017-05-19 NOTE — Progress Notes (Signed)
Would taper prednisone off over the next two weeks.  Chesley Mires, MD [facility] Pulmonary/Critical Care 05/19/2017, 4:51 PM Pager:  [phone] After 3pm call: [phone]

## 2017-05-19 NOTE — Assessment & Plan Note (Signed)
Pleuritis which has resolved with prednisone treatment. Patient is without complaint of pain and without complaint of shortness of breath Plan: We will decrease your prednisone to 20 mg daily. After surgery please practice aggressive pulmonary toilet. Cough and deep breath every hour while awake. Incentive Spirometer every hour while awake. OOB to chair after surgery. Follow up with Dr. Halford Chessman or Judson Roch in 4 weeks with CXR prior to visit, to allow follow-up through resolution of effusion. I will copy Dr. Cherlyn Roberts on this note. Please contact office for sooner follow up if symptoms do not improve or worsen or seek emergency care

## 2017-05-19 NOTE — Patient Instructions (Addendum)
It is nice to meet you today. We will decrease your prednisone to 20 mg daily. With surgery please practice aggressive pulmonary toilet. Cough and deep breath every hour while awake. Incentive Spirometer every hour while awake. OOB to chair after surgery. Follow up with Dr. Halford Chessman or Deneise Getty in 4 weeks with CXR prior to visit. I will copy Dr. Cherlyn Roberts on this note. Please contact office for sooner follow up if symptoms do not improve or worsen or seek emergency care

## 2017-05-19 NOTE — Assessment & Plan Note (Addendum)
Small right pleural effusion Stable per chest x-ray 05/19/2017 Pain and dyspnea have resolved with prednisone treatment Plan Follow-up chest x-ray in 4 weeks to ensure resolution of small pleural effusion Taper prednisone Follow-up in 4 weeks with chest x-ray prior

## 2017-05-19 NOTE — Progress Notes (Signed)
Dr Sabra Heck ( anesthesia) made aware of CXR from 05/02/2017, CXR from 05/19/2017 and Stress Test result from 05/16/2017.  Sat 100% on RA at time of preop. And ECHO to be done on 05/20/17 at 300pm.  No new orders given.
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
## 2017-05-20 ENCOUNTER — Other Ambulatory Visit: Payer: Self-pay

## 2017-05-20 ENCOUNTER — Telehealth: Payer: Self-pay | Admitting: Acute Care

## 2017-05-20 ENCOUNTER — Ambulatory Visit (HOSPITAL_BASED_OUTPATIENT_CLINIC_OR_DEPARTMENT_OTHER): Payer: Medicare Other

## 2017-05-20 DIAGNOSIS — Z01812 Encounter for preprocedural laboratory examination: Secondary | ICD-10-CM | POA: Diagnosis not present

## 2017-05-20 DIAGNOSIS — R079 Chest pain, unspecified: Secondary | ICD-10-CM

## 2017-05-20 DIAGNOSIS — Z6836 Body mass index (BMI) 36.0-36.9, adult: Secondary | ICD-10-CM | POA: Diagnosis not present

## 2017-05-20 DIAGNOSIS — E119 Type 2 diabetes mellitus without complications: Secondary | ICD-10-CM | POA: Diagnosis not present

## 2017-05-20 NOTE — Telephone Encounter (Signed)
-----   Message from Chesley Mires, MD sent at 05/19/2017  4:49 PM EDT ----- Regarding: RE: Prednisone treatment Taper off prednisone over course of next two weeks.  [place]  ----- Message ----- From: Magdalen Spatz, NP Sent: 05/19/2017  10:09 AM To: Chesley Mires, MD Subject: Prednisone treatment                           Dr. Halford Chessman, How long do you want to treat Loise with prednisone. PFT's are normal and she is much better after the prednisone. Serology was negative. I have decreased her dose to 20 mg daily. Effusions unchanged on CXR today.She is having surgery ( Laparoscopic) Tuesday 5/29. Please advise. Thanks, Judson Roch

## 2017-05-20 NOTE — Telephone Encounter (Signed)
Please call patient and have her continue the 20 mg prednisone for an additional 6 days, then decrease to 10 mg x 7 days then stop. Let her know Dr. Halford Chessman wants her tapered off over the next 2 weeks. Thanks

## 2017-05-20 NOTE — Telephone Encounter (Signed)
Called and spoke with pt and she is aware of SG recs of how to take the prednisone 20 mg. Pt voiced her understanding and nothing further is needed.

## 2017-05-20 NOTE — Progress Notes (Signed)
[facility] for orders. First case on 05/24/17

## 2017-05-23 MED ORDER — GENTAMICIN SULFATE 40 MG/ML IJ SOLN
340.0000 mg | INTRAVENOUS | Status: AC
  Administered 2017-05-24: 340 mg via INTRAVENOUS
  Filled 2017-05-23: qty 8.5

## 2017-05-24 ENCOUNTER — Inpatient Hospital Stay (HOSPITAL_COMMUNITY): Payer: Medicare Other | Admitting: Anesthesiology

## 2017-05-24 ENCOUNTER — Inpatient Hospital Stay (HOSPITAL_COMMUNITY)
Admission: RE | Admit: 2017-05-24 | Discharge: 2017-05-25 | DRG: 621 | Disposition: A | Discharge status: Home / Self Care | Payer: Medicare Other | Source: Ambulatory Visit | Attending: General Surgery | Admitting: General Surgery

## 2017-05-24 ENCOUNTER — Encounter (HOSPITAL_COMMUNITY)
Admission: RE | Disposition: A | Discharge status: Home / Self Care | Payer: Self-pay | Source: Ambulatory Visit | Attending: General Surgery

## 2017-05-24 ENCOUNTER — Ambulatory Visit: Payer: Self-pay | Admitting: Nurse Practitioner

## 2017-05-24 ENCOUNTER — Encounter (HOSPITAL_COMMUNITY): Payer: Self-pay | Admitting: *Deleted

## 2017-05-24 DIAGNOSIS — E119 Type 2 diabetes mellitus without complications: Secondary | ICD-10-CM | POA: Diagnosis not present

## 2017-05-24 DIAGNOSIS — R001 Bradycardia, unspecified: Secondary | ICD-10-CM | POA: Diagnosis not present

## 2017-05-24 DIAGNOSIS — Z79899 Other long term (current) drug therapy: Secondary | ICD-10-CM

## 2017-05-24 DIAGNOSIS — F419 Anxiety disorder, unspecified: Secondary | ICD-10-CM | POA: Diagnosis present

## 2017-05-24 DIAGNOSIS — K2950 Unspecified chronic gastritis without bleeding: Secondary | ICD-10-CM | POA: Diagnosis not present

## 2017-05-24 DIAGNOSIS — I10 Essential (primary) hypertension: Secondary | ICD-10-CM | POA: Diagnosis present

## 2017-05-24 DIAGNOSIS — N736 Female pelvic peritoneal adhesions (postinfective): Secondary | ICD-10-CM | POA: Diagnosis not present

## 2017-05-24 DIAGNOSIS — F329 Major depressive disorder, single episode, unspecified: Secondary | ICD-10-CM | POA: Diagnosis present

## 2017-05-24 DIAGNOSIS — N200 Calculus of kidney: Secondary | ICD-10-CM | POA: Diagnosis not present

## 2017-05-24 DIAGNOSIS — Z6836 Body mass index (BMI) 36.0-36.9, adult: Secondary | ICD-10-CM | POA: Diagnosis not present

## 2017-05-24 HISTORY — PX: LAPAROSCOPIC LYSIS OF ADHESIONS: SHX5905

## 2017-05-24 HISTORY — PX: LAPAROSCOPIC GASTRIC SLEEVE RESECTION: SHX5895

## 2017-05-24 HISTORY — PX: UPPER GI ENDOSCOPY: SHX6162

## 2017-05-24 LAB — CBC WITH DIFFERENTIAL/PLATELET
Basophils Absolute: 0 10*3/uL (ref 0.0–0.1)
Basophils Relative: 0 %
Eosinophils Absolute: 0 10*3/uL (ref 0.0–0.7)
Eosinophils Relative: 0 %
HEMATOCRIT: 38 % (ref 36.0–46.0)
HEMOGLOBIN: 12 g/dL (ref 12.0–15.0)
LYMPHS ABS: 0.8 10*3/uL (ref 0.7–4.0)
LYMPHS PCT: 6 %
MCH: 26.8 pg (ref 26.0–34.0)
MCHC: 31.6 g/dL (ref 30.0–36.0)
MCV: 85 fL (ref 78.0–100.0)
MONOS PCT: 2 %
Monocytes Absolute: 0.3 10*3/uL (ref 0.1–1.0)
NEUTROS ABS: 13.7 10*3/uL — AB (ref 1.7–7.7)
NEUTROS PCT: 92 %
Platelets: 281 10*3/uL (ref 150–400)
RBC: 4.47 MIL/uL (ref 3.87–5.11)
RDW: 14 % (ref 11.5–15.5)
WBC: 14.8 10*3/uL — AB (ref 4.0–10.5)

## 2017-05-24 LAB — COMPREHENSIVE METABOLIC PANEL
ALK PHOS: 68 U/L (ref 38–126)
ALT: 11 U/L — ABNORMAL LOW (ref 14–54)
ANION GAP: 10 (ref 5–15)
AST: 19 U/L (ref 15–41)
Albumin: 3.6 g/dL (ref 3.5–5.0)
BILIRUBIN TOTAL: 0.5 mg/dL (ref 0.3–1.2)
BUN: 14 mg/dL (ref 6–20)
CALCIUM: 8.6 mg/dL — AB (ref 8.9–10.3)
CO2: 25 mmol/L (ref 22–32)
CREATININE: 1.05 mg/dL — AB (ref 0.44–1.00)
Chloride: 106 mmol/L (ref 101–111)
Glucose, Bld: 174 mg/dL — ABNORMAL HIGH (ref 65–99)
Potassium: 3.7 mmol/L (ref 3.5–5.1)
SODIUM: 141 mmol/L (ref 135–145)
Total Protein: 6.4 g/dL — ABNORMAL LOW (ref 6.5–8.1)

## 2017-05-24 LAB — PREGNANCY, URINE: Preg Test, Ur: NEGATIVE

## 2017-05-24 LAB — GLUCOSE, CAPILLARY
GLUCOSE-CAPILLARY: 106 mg/dL — AB (ref 65–99)
Glucose-Capillary: 169 mg/dL — ABNORMAL HIGH (ref 65–99)
Glucose-Capillary: 161 mg/dL — ABNORMAL HIGH (ref 65–99)
Glucose-Capillary: 145 mg/dL — ABNORMAL HIGH (ref 65–99)
Glucose-Capillary: 127 mg/dL — ABNORMAL HIGH (ref 65–99)

## 2017-05-24 LAB — HEMOGLOBIN AND HEMATOCRIT, BLOOD
HCT: 38 % (ref 36.0–46.0)
Hemoglobin: 12 g/dL (ref 12.0–15.0)

## 2017-05-24 SURGERY — GASTRECTOMY, SLEEVE, LAPAROSCOPIC
Anesthesia: General | Site: Abdomen

## 2017-05-24 MED ORDER — ROCURONIUM BROMIDE 50 MG/5ML IV SOSY
PREFILLED_SYRINGE | INTRAVENOUS | Status: AC
  Filled 2017-05-24: qty 5

## 2017-05-24 MED ORDER — BUPIVACAINE LIPOSOME 1.3 % IJ SUSP
20.0000 mL | Freq: Once | INTRAMUSCULAR | Status: DC
  Filled 2017-05-24: qty 20

## 2017-05-24 MED ORDER — EPHEDRINE 5 MG/ML INJ
INTRAVENOUS | Status: AC
  Filled 2017-05-24: qty 10

## 2017-05-24 MED ORDER — CLINDAMYCIN PHOSPHATE 900 MG/50ML IV SOLN
900.0000 mg | INTRAVENOUS | Status: AC
  Administered 2017-05-24: 900 mg via INTRAVENOUS

## 2017-05-24 MED ORDER — MIDAZOLAM HCL 5 MG/5ML IJ SOLN
INTRAMUSCULAR | Status: DC | PRN
  Administered 2017-05-24: 2 mg via INTRAVENOUS

## 2017-05-24 MED ORDER — BUPIVACAINE HCL 0.25 % IJ SOLN
INTRAMUSCULAR | Status: DC | PRN
  Administered 2017-05-24: 30 mL

## 2017-05-24 MED ORDER — HEPARIN SODIUM (PORCINE) 5000 UNIT/ML IJ SOLN
5000.0000 [IU] | INTRAMUSCULAR | Status: AC
  Administered 2017-05-24: 5000 [IU] via SUBCUTANEOUS
  Filled 2017-05-24: qty 1

## 2017-05-24 MED ORDER — ACETAMINOPHEN 160 MG/5ML PO SOLN: 325.0000 mg | ORAL | Status: DC | PRN

## 2017-05-24 MED ORDER — PROPOFOL 10 MG/ML IV BOLUS
INTRAVENOUS | Status: DC | PRN
  Administered 2017-05-24: 180 mg via INTRAVENOUS

## 2017-05-24 MED ORDER — LIDOCAINE 2% (20 MG/ML) 5 ML SYRINGE
INTRAMUSCULAR | Status: AC
  Filled 2017-05-24: qty 5

## 2017-05-24 MED ORDER — ACETAMINOPHEN 325 MG PO TABS: 650.0000 mg | ORAL_TABLET | ORAL | Status: DC | PRN

## 2017-05-24 MED ORDER — PROPOFOL 10 MG/ML IV BOLUS
INTRAVENOUS | Status: AC
  Filled 2017-05-24: qty 20

## 2017-05-24 MED ORDER — SODIUM CHLORIDE 0.9 % IV SOLN
INTRAVENOUS | Status: DC
  Administered 2017-05-24 (×2): 1000 mL via INTRAVENOUS

## 2017-05-24 MED ORDER — OXYCODONE HCL 5 MG/5ML PO SOLN
5.0000 mg | ORAL | Status: DC | PRN
  Administered 2017-05-24 – 2017-05-25 (×6): 5 mg via ORAL
  Filled 2017-05-24 (×6): qty 5

## 2017-05-24 MED ORDER — EPHEDRINE SULFATE-NACL 50-0.9 MG/10ML-% IV SOSY
PREFILLED_SYRINGE | INTRAVENOUS | Status: DC | PRN
  Administered 2017-05-24 (×2): 10 mg via INTRAVENOUS
  Administered 2017-05-24: 5 mg via INTRAVENOUS
  Administered 2017-05-24 (×3): 10 mg via INTRAVENOUS
  Administered 2017-05-24: 5 mg via INTRAVENOUS

## 2017-05-24 MED ORDER — APREPITANT 40 MG PO CAPS
40.0000 mg | ORAL_CAPSULE | ORAL | Status: AC
  Administered 2017-05-24: 40 mg via ORAL
  Filled 2017-05-24: qty 1

## 2017-05-24 MED ORDER — LIDOCAINE 2% (20 MG/ML) 5 ML SYRINGE
INTRAMUSCULAR | Status: DC | PRN
  Administered 2017-05-24: 100 mg via INTRAVENOUS

## 2017-05-24 MED ORDER — ROCURONIUM BROMIDE 10 MG/ML (PF) SYRINGE
PREFILLED_SYRINGE | INTRAVENOUS | Status: DC | PRN
  Administered 2017-05-24: 10 mg via INTRAVENOUS
  Administered 2017-05-24: 40 mg via INTRAVENOUS
  Administered 2017-05-24: 10 mg via INTRAVENOUS

## 2017-05-24 MED ORDER — FENTANYL CITRATE (PF) 100 MCG/2ML IJ SOLN
INTRAMUSCULAR | Status: AC
  Filled 2017-05-24: qty 2

## 2017-05-24 MED ORDER — ONDANSETRON HCL 4 MG/2ML IJ SOLN
INTRAMUSCULAR | Status: AC
  Filled 2017-05-24: qty 2

## 2017-05-24 MED ORDER — MORPHINE SULFATE (PF) 2 MG/ML IV SOLN
1.0000 mg | INTRAVENOUS | Status: DC | PRN
  Administered 2017-05-24: 2 mg via INTRAVENOUS
  Filled 2017-05-24: qty 1

## 2017-05-24 MED ORDER — METOPROLOL TARTRATE 25 MG PO TABS
25.0000 mg | ORAL_TABLET | Freq: Two times a day (BID) | ORAL | Status: DC
  Administered 2017-05-24 – 2017-05-25 (×2): 25 mg via ORAL
  Filled 2017-05-24: qty 1

## 2017-05-24 MED ORDER — MIDAZOLAM HCL 2 MG/2ML IJ SOLN
INTRAMUSCULAR | Status: AC
  Filled 2017-05-24: qty 2

## 2017-05-24 MED ORDER — SUCCINYLCHOLINE CHLORIDE 200 MG/10ML IV SOSY
PREFILLED_SYRINGE | INTRAVENOUS | Status: AC
  Filled 2017-05-24: qty 10

## 2017-05-24 MED ORDER — LACTATED RINGERS IR SOLN
Status: DC | PRN
  Administered 2017-05-24: 1000 mL

## 2017-05-24 MED ORDER — DEXAMETHASONE SODIUM PHOSPHATE 10 MG/ML IJ SOLN
INTRAMUSCULAR | Status: DC | PRN
  Administered 2017-05-24: 10 mg via INTRAVENOUS

## 2017-05-24 MED ORDER — BUPIVACAINE HCL (PF) 0.25 % IJ SOLN
INTRAMUSCULAR | Status: AC
  Filled 2017-05-24: qty 30

## 2017-05-24 MED ORDER — SCOPOLAMINE 1 MG/3DAYS TD PT72
1.0000 | MEDICATED_PATCH | TRANSDERMAL | Status: DC
  Administered 2017-05-24: 1.5 mg via TRANSDERMAL
  Filled 2017-05-24: qty 1

## 2017-05-24 MED ORDER — INSULIN ASPART 100 UNIT/ML ~~LOC~~ SOLN
0.0000 [IU] | Freq: Three times a day (TID) | SUBCUTANEOUS | Status: DC
  Administered 2017-05-24: 3 [IU] via SUBCUTANEOUS
  Administered 2017-05-24: 2 [IU] via SUBCUTANEOUS

## 2017-05-24 MED ORDER — GABAPENTIN 300 MG PO CAPS
300.0000 mg | ORAL_CAPSULE | ORAL | Status: AC
Start: 2017-05-24 — End: 2017-05-24
  Administered 2017-05-24: 300 mg via ORAL
  Filled 2017-05-24: qty 1

## 2017-05-24 MED ORDER — DEXAMETHASONE SODIUM PHOSPHATE 10 MG/ML IJ SOLN
INTRAMUSCULAR | Status: AC
  Filled 2017-05-24: qty 1

## 2017-05-24 MED ORDER — CLINDAMYCIN PHOSPHATE 900 MG/50ML IV SOLN
INTRAVENOUS | Status: AC
  Filled 2017-05-24: qty 50

## 2017-05-24 MED ORDER — PROMETHAZINE HCL 25 MG/ML IJ SOLN: 6.2500 mg | INTRAMUSCULAR | Status: DC | PRN

## 2017-05-24 MED ORDER — ONDANSETRON HCL 4 MG/2ML IJ SOLN: 4.0000 mg | INTRAMUSCULAR | Status: DC | PRN

## 2017-05-24 MED ORDER — LACTATED RINGERS IV SOLN
INTRAVENOUS | Status: DC | PRN
  Administered 2017-05-24 (×3): via INTRAVENOUS

## 2017-05-24 MED ORDER — FENTANYL CITRATE (PF) 100 MCG/2ML IJ SOLN
INTRAMUSCULAR | Status: DC | PRN
  Administered 2017-05-24 (×2): 50 ug via INTRAVENOUS

## 2017-05-24 MED ORDER — TRAZODONE HCL 50 MG PO TABS
150.0000 mg | ORAL_TABLET | Freq: Every day | ORAL | Status: DC
  Administered 2017-05-24: 150 mg via ORAL
  Filled 2017-05-24: qty 1

## 2017-05-24 MED ORDER — SUGAMMADEX SODIUM 200 MG/2ML IV SOLN
INTRAVENOUS | Status: AC
  Filled 2017-05-24: qty 2

## 2017-05-24 MED ORDER — CHLORHEXIDINE GLUCONATE 4 % EX LIQD: 60.0000 mL | Freq: Once | CUTANEOUS | Status: DC

## 2017-05-24 MED ORDER — ACETAMINOPHEN 500 MG PO TABS
1000.0000 mg | ORAL_TABLET | ORAL | Status: AC
  Administered 2017-05-24: 1000 mg via ORAL
  Filled 2017-05-24: qty 2

## 2017-05-24 MED ORDER — HEPARIN SODIUM (PORCINE) 5000 UNIT/ML IJ SOLN
5000.0000 [IU] | Freq: Three times a day (TID) | INTRAMUSCULAR | Status: DC
  Administered 2017-05-24 – 2017-05-25 (×3): 5000 [IU] via SUBCUTANEOUS
  Filled 2017-05-24 (×3): qty 1

## 2017-05-24 MED ORDER — PANTOPRAZOLE SODIUM 40 MG IV SOLR
40.0000 mg | Freq: Every day | INTRAVENOUS | Status: DC
  Administered 2017-05-24: 40 mg via INTRAVENOUS
  Filled 2017-05-24: qty 40

## 2017-05-24 MED ORDER — BUPIVACAINE LIPOSOME 1.3 % IJ SUSP
INTRAMUSCULAR | Status: DC | PRN
  Administered 2017-05-24: 20 mL

## 2017-05-24 MED ORDER — SUCCINYLCHOLINE CHLORIDE 200 MG/10ML IV SOSY
PREFILLED_SYRINGE | INTRAVENOUS | Status: DC | PRN
  Administered 2017-05-24: 120 mg via INTRAVENOUS
  Administered 2017-05-24: 40 mg via INTRAVENOUS

## 2017-05-24 MED ORDER — HYDROMORPHONE HCL 1 MG/ML IJ SOLN: 0.2500 mg | INTRAMUSCULAR | Status: DC | PRN

## 2017-05-24 MED ORDER — ONDANSETRON HCL 4 MG/2ML IJ SOLN
INTRAMUSCULAR | Status: DC | PRN
  Administered 2017-05-24: 4 mg via INTRAVENOUS

## 2017-05-24 MED ORDER — PREMIER PROTEIN SHAKE: 2.0000 [oz_av] | ORAL | Status: DC

## 2017-05-24 MED ORDER — SUGAMMADEX SODIUM 200 MG/2ML IV SOLN
INTRAVENOUS | Status: DC | PRN
  Administered 2017-05-24: 200 mg via INTRAVENOUS

## 2017-05-24 MED ORDER — BUSPIRONE HCL 5 MG PO TABS
15.0000 mg | ORAL_TABLET | Freq: Three times a day (TID) | ORAL | Status: DC
  Administered 2017-05-24 – 2017-05-25 (×3): 15 mg via ORAL
  Filled 2017-05-24 (×2): qty 1

## 2017-05-24 MED ORDER — DEXAMETHASONE SODIUM PHOSPHATE 4 MG/ML IJ SOLN: 4.0000 mg | INTRAMUSCULAR | Status: DC

## 2017-05-24 SURGICAL SUPPLY — 64 items
APL SKNCLS STERI-STRIP NONHPOA (GAUZE/BANDAGES/DRESSINGS) ×3
APPLIER CLIP 5 13 M/L LIGAMAX5 (MISCELLANEOUS)
APPLIER CLIP ROT 10 11.4 M/L (STAPLE)
APPLIER CLIP ROT 13.4 12 LRG (CLIP)
APR CLP LRG 13.4X12 ROT 20 MLT (CLIP)
APR CLP MED LRG 11.4X10 (STAPLE)
APR CLP MED LRG 5 ANG JAW (MISCELLANEOUS)
BAG LAPAROSCOPIC 12 15 PORT 16 (BASKET) ×3 IMPLANT
BAG RETRIEVAL 12/15 (BASKET) ×4
BAG RETRIEVAL 12/15MM (BASKET) ×1
BANDAGE ADH SHEER 1  50/CT (GAUZE/BANDAGES/DRESSINGS) ×25 IMPLANT
BENZOIN TINCTURE PRP APPL 2/3 (GAUZE/BANDAGES/DRESSINGS) ×5 IMPLANT
BLADE SURG SZ11 CARB STEEL (BLADE) ×5 IMPLANT
CABLE HIGH FREQUENCY MONO STRZ (ELECTRODE) ×5 IMPLANT
CHLORAPREP W/TINT 26ML (MISCELLANEOUS) ×5 IMPLANT
CLIP APPLIE 5 13 M/L LIGAMAX5 (MISCELLANEOUS) IMPLANT
CLIP APPLIE ROT 10 11.4 M/L (STAPLE) IMPLANT
CLIP APPLIE ROT 13.4 12 LRG (CLIP) IMPLANT
CLOSURE WOUND 1/2 X4 (GAUZE/BANDAGES/DRESSINGS) ×1
COVER SURGICAL LIGHT HANDLE (MISCELLANEOUS) ×5 IMPLANT
DRAIN CHANNEL 19F RND (DRAIN) IMPLANT
ELECT REM PT RETURN 15FT ADLT (MISCELLANEOUS) ×5 IMPLANT
EVACUATOR SILICONE 100CC (DRAIN) IMPLANT
GAUZE SPONGE 4X4 12PLY STRL (GAUZE/BANDAGES/DRESSINGS) IMPLANT
GLOVE BIOGEL PI IND STRL 7.0 (GLOVE) ×3 IMPLANT
GLOVE BIOGEL PI INDICATOR 7.0 (GLOVE) ×2
GLOVE SURG SS PI 7.0 STRL IVOR (GLOVE) ×5 IMPLANT
GOWN STRL REUS W/TWL LRG LVL3 (GOWN DISPOSABLE) ×5 IMPLANT
GOWN STRL REUS W/TWL XL LVL3 (GOWN DISPOSABLE) ×15 IMPLANT
GRASPER SUT TROCAR 14GX15 (MISCELLANEOUS) ×5 IMPLANT
HANDLE STAPLE EGIA 4 XL (STAPLE) ×5 IMPLANT
HOVERMATT SINGLE USE (MISCELLANEOUS) ×5 IMPLANT
KIT BASIN OR (CUSTOM PROCEDURE TRAY) ×5 IMPLANT
MARKER SKIN DUAL TIP RULER LAB (MISCELLANEOUS) ×5 IMPLANT
NEEDLE SPNL 22GX3.5 QUINCKE BK (NEEDLE) ×5 IMPLANT
PACK CARDIOVASCULAR III (CUSTOM PROCEDURE TRAY) ×5 IMPLANT
RELOAD EGIA 45 MED/THCK PURPLE (STAPLE) IMPLANT
RELOAD TRI 45 ART MED THCK BLK (STAPLE) IMPLANT
RELOAD TRI 45 ART MED THCK PUR (STAPLE) IMPLANT
RELOAD TRI 60 ART MED THCK BLK (STAPLE) ×15 IMPLANT
RELOAD TRI 60 ART MED THCK PUR (STAPLE) ×10 IMPLANT
SCISSORS LAP 5X45 EPIX DISP (ENDOMECHANICALS) ×5 IMPLANT
SET IRRIG TUBING LAPAROSCOPIC (IRRIGATION / IRRIGATOR) ×5 IMPLANT
SHEARS HARMONIC ACE PLUS 45CM (MISCELLANEOUS) ×5 IMPLANT
SLEEVE GASTRECTOMY 40FR VISIGI (MISCELLANEOUS) ×5 IMPLANT
SLEEVE XCEL OPT CAN 5 100 (ENDOMECHANICALS) ×10 IMPLANT
SOLUTION ANTI FOG 6CC (MISCELLANEOUS) ×5 IMPLANT
SPONGE LAP 18X18 X RAY DECT (DISPOSABLE) ×5 IMPLANT
STRIP CLOSURE SKIN 1/2X4 (GAUZE/BANDAGES/DRESSINGS) ×4 IMPLANT
SUT ETHIBOND 0 36 GRN (SUTURE) IMPLANT
SUT ETHILON 2 0 PS N (SUTURE) IMPLANT
SUT MNCRL AB 4-0 PS2 18 (SUTURE) ×10 IMPLANT
SUT SILK 0 SH 30 (SUTURE) IMPLANT
SUT VICRYL 0 TIES 12 18 (SUTURE) ×5 IMPLANT
SYR 20CC LL (SYRINGE) ×5 IMPLANT
SYR 50ML LL SCALE MARK (SYRINGE) ×5 IMPLANT
TOWEL OR 17X26 10 PK STRL BLUE (TOWEL DISPOSABLE) ×5 IMPLANT
TOWEL OR NON WOVEN STRL DISP B (DISPOSABLE) ×5 IMPLANT
TROCAR BLADELESS 15MM (ENDOMECHANICALS) ×5 IMPLANT
TROCAR BLADELESS OPT 5 100 (ENDOMECHANICALS) ×5 IMPLANT
TUBING CONNECTING 10 (TUBING) ×8 IMPLANT
TUBING CONNECTING 10' (TUBING) ×2
TUBING ENDO SMARTCAP (MISCELLANEOUS) ×5 IMPLANT
TUBING INSUF HEATED (TUBING) ×5 IMPLANT

## 2017-05-24 NOTE — Op Note (Addendum)
Preop Diagnosis: Obesity Class II with major comorbidity  Postop Diagnosis: same  Procedure performed: laparoscopic Sleeve Gastrectomy, laparoscopic lysis of adhesions  Assitant: Kaylyn Lim  Indications:  The patient is a 46 y.o. year-old morbidly obese female who has been followed in the Bariatric Clinic as an outpatient. This patient was diagnosed with morbid obesity with a BMI of Body mass index is 36.58 kg/m. and significant co-morbidities including hypertension and diabetes.  The patient was counseled extensively in the Bariatric Outpatient Clinic and after a thorough explanation of the risks and benefits of surgery (including death from complications, bowel leak, infection such as peritonitis and/or sepsis, internal hernia, bleeding, need for blood transfusion, bowel obstruction, organ failure, pulmonary embolus, deep venous thrombosis, wound infection, incisional hernia, skin breakdown, and others entailed on the consent form) and after a compliant diet and exercise program, the patient was scheduled for an elective laparoscopic sleeve gastrectomy.  Description of Operation:  Following informed consent, the patient was taken to the operating room and placed on the operating table in the supine position.  She had previously received prophylactic antibiotics and subcutaneous heparin for DVT prophylaxis in the pre-op holding area.  After induction of general endotracheal anesthesia by the anesthesiologist, the patient underwent placement of sequential compression devices, Foley catheter and an oro-gastric tube.  A timeout was confirmed by the surgery and anesthesia teams.  The patient was adequately padded at all pressure points and placed on a footboard to prevent slippage from the OR table during extremes of position during surgery.  She underwent a routine sterile prep and drape of her entire abdomen.    Next, A transverse incision was made under the left subcostal area and a 36mm optical  viewing trocar was introduced into the peritoneal cavity. Pneumoperitoneum was applied with a high flow and low pressure. A laparoscope was inserted to confirm placement. There was a large amount of adhesions and no large cavity. Blunt dissection with the laparoscope was performed to create a cavity. 5 additional trocars were placed: 1 85mm trocar in the midline, which did pas through the extraperitoneal mesh. 1 additional 88mm trocar in the left lateral area, and 1 42mm trocar in the right mid abdomen. The left lobe of the liver was adhered to the abdominal wall and no additional retraction was required. Further blunt and sharp dissection was required over the upper abdomen. At the end the stomach was identified and free of the omentum and liver. Total lysis was 50 minutes total.  A extraperitoneal block was then placed at the lateral abdominal wall using exparel diluted with marcaine.   Next, a hole was created through the lesser omentum along the greater curve of the stomach to enter the lesser sac. The lesser sac was still fairly adhesion laden but clear enough for dissection. The vessels along the greater omentum were  Then ligated and divided using the Harmonic scalpel moving towards the spleen and then short gastric vessels were ligated and divided in the same fashion to fully mobilize the fundus. The left crus was identified to ensure completion of the dissection. Next the antrum was measured and dissection continued inferiorly along the greater curve towards the pylorus and stopped 6cm from the pylorus.   A 40Fr ViSiGi dilator was placed into the esophgaus and along the lesser curve of the stomach and placed on suction. A 44mm 4-7mm tristapler was used to begin the resection along the antrum being sure to stay well away from the angularis by angling the jaws  of the stapler towards the greater curve. An additional 73mm 4-28mm tristapler was used to continue the dissection. Then multiple 31mm 3-38mm  tristapler loads were used to complete the resection staying along the [place] and ensuring the fundus was not retained by appropriately retracting it lateral. Air was inserted through the [place] to perform a leak test showing no bubbles and a neutral lie of the stomach.  The assistant then went and performed an upper endoscopy and leak test. There was less than a 3cm fundic area left due to the amount of dense adhesions at the GE junction. No bubbles were seen and the sleeve and antrum distended appropriately. The specimen was then placed in an endocatch bag and removed by the 43mm port. The fascia of the 11mm port was closed with a 0 vicryl by suture passer. Hemostasis was ensured. Pneumoperitoneum was evacuated, all ports were removed and all incisions closed with 4-0 monocryl suture in subcuticular fashion. Glue was put in place for dressing. The patient awoke from anesthesia and was brought to pacu in stable condition. All counts were correct.  Estimated blood loss: <25ml  Specimens:  Sleeve gastrectomy  Local Anesthesia: 50 ml Exparel:0.5% Marcaine mix  Post-Op Plan:       Pain Management: PO, prn      Antibiotics: Prophylactic      Anticoagulation: Prophylactic, Starting now      Post Op Studies/Consults: Not applicable      Intended Discharge: within 48h      Intended Outpatient Follow-Up: Two Week      Intended Outpatient Studies: Not Applicable      Other: Not Applicable   [name] [name] [name] [name]

## 2017-05-24 NOTE — Op Note (Signed)
DEOSHA WERDEN [id number] [birth year] 05/24/2017  Preoperative diagnosis: morbid obesity  Postoperative diagnosis: Same   Procedure: Upper endoscopy   Surgeon: Catalina [place] B. Hassell Done  M.D., FACS   Anesthesia: Gen.   Indications for procedure: This patient was undergoing a sleeve by Dr. Kieth Brightly.  Endoscopy to look for evidence of bleeding, leaks, and geometry of pouch.    Description of procedure: The endoscopy was placed in the mouth and into the oropharynx and under endoscopic vision it was advanced to the esophagogastric junction.  The pouch was insufflated and the scope easily passed to visualize the pylorus.  Symmetry was good and there was not much of a proximal pouch seen.  No evidence of a hiatal hernia.  .   No bleeding or leaks were detected.  The scope was withdrawn without difficulty.     Matt B. Hassell Done, MD, FACS General, Bariatric, & Minimally Invasive Surgery [facility], [place]

## 2017-05-24 NOTE — H&P (Signed)
History of Present Illness Geoffery Spruce, MD; 05/24/2017 7:10 AM) The patient is a 46 year old female who presents for a bariatric surgery evaluation. Patient has completed all necessary workup in the preoperative phase for bariatric surgery. The patient is now ready to proceed with surgery. The patient has made moderate improvements in her diet and is exercising well and has no new medical issues or new medications.    Allergies Malachy Moan, [place]; 05/11/2017 1:43 PM) Enoxaparin Sodium *ANTICOAGULANTS*  Augmentin ES-600 *PENICILLINS*  Avelox *FLUOROQUINOLONES*  Itching, Swelling, Rash. Sodium Alginate *CHEMICALS*  Allergies Reconciled   Medication History Malachy Moan, RMA; 05/11/2017 1:45 PM) TraMADol HCl (50MG  Tablet, Oral) Active. Furosemide (20MG  Tablet, Oral) Active. Ondansetron HCl (8MG  Tablet, Oral) Active. BusPIRone HCl (15MG  Tablet, Oral) Active. Metoprolol Tartrate (25MG  Tablet, Oral) Active.    Review of Systems Geoffery Spruce MD; 05/24/2017 7:08 AM) General Not Present- Appetite Loss, Chills, Fatigue, Fever, Night Sweats, Weight Gain and Weight Loss. Gastrointestinal Present- Gets full quickly at meals. Not Present- Abdominal Pain, Bloating, Bloody Stool, Change in Bowel Habits, Chronic diarrhea, Constipation, Difficulty Swallowing, Excessive gas, Hemorrhoids, Indigestion, Nausea, Rectal Pain and Vomiting. Female Genitourinary Not Present- Frequency, Nocturia, Painful Urination, Pelvic Pain and Urgency. Musculoskeletal Not Present- Back Pain, Joint Pain, Joint Stiffness, Muscle Pain, Muscle Weakness and Swelling of Extremities. Neurological Not Present- Decreased Memory, Fainting, Headaches, Numbness, Seizures, Tingling, Tremor, Trouble walking and Weakness. Psychiatric Present- Anxiety and Depression. Not Present- Bipolar, Change in Sleep Pattern, Fearful and Frequent crying. Endocrine Present- Heat Intolerance. Not Present- Cold Intolerance,  Excessive Hunger, Hair Changes, Hot flashes and New Diabetes.  Vitals Malachy Moan RMA; 05/11/2017 1:46 PM) 05/11/2017 1:45 PM Weight: 207.2 lb Height: 63in Body Surface Area: 1.96 m Body Mass Index: 36.7 kg/m  Temp.: 98.16F  Pulse: 93 (Regular)  BP: 92/70 (Sitting, Left Arm, Standard)       Physical Exam Geoffery Spruce MD; 05/24/2017 7:08 AM) General Mental Status-Alert. General Appearance-Cooperative. Orientation-Oriented X4. Build & Nutrition-Obese. Posture-Normal posture.  Integumentary Global Assessment Upon inspection and palpation of skin surfaces of the - Head/Face: no rashes, ulcers, lesions or evidence of photo damage. No palpable nodules or masses and Neck: no visible lesions or palpable masses.  Head and Neck Head-normocephalic, atraumatic with no lesions or palpable masses. Face Global Assessment - atraumatic. Thyroid Gland Characteristics - normal size and consistency.  Eye Eyeball - Bilateral-Extraocular movements intact. Sclera/Conjunctiva - Bilateral-No scleral icterus, No Discharge.  ENMT Nose and Sinuses External Inspection of the Nose - no deformities observed, no swelling present.  Chest and Lung Exam Palpation Palpation of the chest reveals - Non-tender. Auscultation Breath sounds - Normal.  Cardiovascular Auscultation Rhythm - Regular. Heart Sounds - S1 WNL and S2 WNL. Carotid arteries - No Carotid bruit.  Abdomen Inspection Inspection of the abdomen reveals - No Visible peristalsis, No Abnormal pulsations and No Paradoxical movements. Palpation/Percussion Palpation and Percussion of the abdomen reveal - Soft, Non Tender, No Rebound tenderness, No Rigidity (guarding), No hepatosplenomegaly and No Palpable abdominal masses.  Peripheral Vascular Upper Extremity Palpation - Pulses bilaterally normal. Lower Extremity Palpation - Edema - Bilateral - No edema.  Neurologic Neurologic evaluation reveals  -normal sensation and normal coordination.  Neuropsychiatric Mental status exam performed with findings of-able to articulate well with normal speech/language, rate, volume and coherence and thought content normal with ability to perform basic computations and apply abstract reasoning.  Musculoskeletal Normal Exam - Bilateral-Upper Extremity Strength Normal and Lower Extremity Strength Normal.  Assessment & Plan Geoffery Spruce MD; 05/24/2017 7:09 AM) CLASS 2 OBESITY WITH SERIOUS COMORBIDITY AND BODY MASS INDEX (BMI) OF 38.0 TO 38.9 IN ADULT (E66.27) Story: 46 year old female with class II obesity and multiple comorbidities. She is most interested in sleeve gastrectomy. She has now completed all work up and is ready to proceed with sleeve gastrectomy. Impression: We again discussed the details of the operation: Will be done under general anesthesia with an endotracheal tube, that it will be a laparoscopic procedure with 6 small incisions large one being 1.5-2in, that we will remove remove the short gastrics and other vessels to the greater curve of the stomach, place a large Bougie down the stomach and then remove a percent of the stomach using a linear stapler. We discussed the procedure will take approximately 1.5-2 hours. We discussed risks of VTE, staple line leak, skin infection, sleeve stenosis, incisional hernia, need for open procedure, and postoperative nausea and vomiting.

## 2017-05-24 NOTE — Anesthesia Postprocedure Evaluation (Signed)
Anesthesia Post Note  Patient: [name] [name]  Procedure(s) Performed: Procedure(s) (LRB): LAPAROSCOPIC GASTRIC SLEEVE RESECTION WITH UPPER ENDO (N/A) UPPER GI ENDOSCOPY LAPAROSCOPIC LYSIS OF ADHESIONS  Patient location during evaluation: PACU Anesthesia Type: General Level of consciousness: awake and alert Pain management: pain level controlled Vital Signs Assessment: post-procedure vital signs reviewed and stable Respiratory status: spontaneous breathing, nonlabored ventilation, respiratory function stable and patient connected to nasal cannula oxygen Cardiovascular status: blood pressure returned to baseline and stable Postop Assessment: no signs of nausea or vomiting Anesthetic complications: no       Last Vitals:  Vitals:   05/24/17 1200 05/24/17 1343  BP: (!) 122/59 128/62  Pulse: 65 68  Resp: 18 18  Temp: 36.7 C 37.1 C    Last Pain:  Vitals:   05/24/17 1343  TempSrc: Oral  PainSc:                  Calib Wadhwa S

## 2017-05-24 NOTE — Anesthesia Preprocedure Evaluation (Addendum)
Anesthesia Evaluation  Patient identified by MRN, date of birth, ID band Patient awake    Reviewed: Allergy & Precautions, NPO status , Patient's Chart, lab work & pertinent test results  Airway Mallampati: II  TM Distance: >3 FB Neck ROM: Full    Dental no notable dental hx. (+)    Pulmonary neg pulmonary ROS,    Pulmonary exam normal breath sounds clear to auscultation       Cardiovascular hypertension, Normal cardiovascular exam Rhythm:Regular Rate:Normal     Neuro/Psych Depression Schizophrenia negative neurological ROS     GI/Hepatic negative GI ROS, Neg liver ROS,   Endo/Other  diabetes  Renal/GU   negative genitourinary   Musculoskeletal negative musculoskeletal ROS (+)   Abdominal (+) + obese,   Peds negative pediatric ROS (+)  Hematology negative hematology ROS (+)   Anesthesia Other Findings   Reproductive/Obstetrics negative OB ROS                            Anesthesia Physical Anesthesia Plan  ASA: III  Anesthesia Plan: General   Post-op Pain Management:    Induction: Intravenous  Airway Management Planned: Oral ETT  Additional Equipment:   Intra-op Plan:   Post-operative Plan: Extubation in OR  Informed Consent: I have reviewed the patients History and Physical, chart, labs and discussed the procedure including the risks, benefits and alternatives for the proposed anesthesia with the patient or authorized representative who has indicated his/her understanding and acceptance.   Dental advisory given  Plan Discussed with: CRNA and Surgeon  Anesthesia Plan Comments:         Anesthesia Quick Evaluation

## 2017-05-24 NOTE — Anesthesia Procedure Notes (Signed)
Procedure Name: Intubation Date/Time: 05/24/2017 7:20 AM Performed by: Lind Covert Pre-anesthesia Checklist: Patient identified, Emergency Drugs available, Suction available, Patient being monitored and Timeout performed Patient Re-evaluated:Patient Re-evaluated prior to inductionOxygen Delivery Method: Circle system utilized Preoxygenation: Pre-oxygenation with 100% oxygen Intubation Type: IV induction Ventilation: Mask ventilation without difficulty Laryngoscope Size: Mac and 3 Grade View: Grade I Tube type: Oral Tube size: 7.0 mm Number of attempts: 1 Airway Equipment and Method: Stylet Placement Confirmation: ETT inserted through vocal cords under direct vision,  positive ETCO2 and breath sounds checked- equal and bilateral Secured at: 21 cm Tube secured with: Tape Dental Injury: Teeth and Oropharynx as per pre-operative assessment

## 2017-05-24 NOTE — Progress Notes (Signed)
Patient alert and oriented.  Ambulated in hallway and has voided since arriving on department.  Vital signs stable.  Started on 2 ounces of water/ice.  RN at bedside

## 2017-05-24 NOTE — Discharge Instructions (Signed)
° ° ° °GASTRIC BYPASS/SLEEVE ° Home Care Instructions ° ° These instructions are to help you care for yourself when you go home. ° °Call: If you have any problems. °• Call [phone] and ask for the surgeon on call °• If you need immediate assistance come to the ER at [facility]. Tell the ER staff you are a new post-op gastric bypass or gastric sleeve patient  °Signs and symptoms to report: • Severe  vomiting or nausea °o If you cannot handle clear liquids for longer than 1 day, call your surgeon °• Abdominal pain which does not get better after taking your pain medication °• Fever greater than 100.4°  F and chills °• Heart rate over 100 beats a minute °• Trouble breathing °• Chest pain °• Redness,  swelling, drainage, or foul odor at incision (surgical) sites °• If your incisions open or pull apart °• Swelling or pain in calf (lower leg) °• Diarrhea (Loose bowel movements that happen often), frequent watery, uncontrolled bowel movements °• Constipation, (no bowel movements for 3 days) if this happens: °o Take Milk of Magnesia, 2 tablespoons by mouth, 3 times a day for 2 days if needed °o Stop taking Milk of Magnesia once you have had a bowel movement °o Call your doctor if constipation continues °Or °o Take Miralax  (instead of Milk of Magnesia) following the label instructions °o Stop taking Miralax once you have had a bowel movement °o Call your doctor if constipation continues °• Anything you think is “abnormal for you” °  °Normal side effects after surgery: • Unable to sleep at night or unable to concentrate °• Irritability °• Being tearful (crying) or depressed ° °These are common complaints, possibly related to your anesthesia, stress of surgery, and change in lifestyle, that usually go away a few weeks after surgery. If these feelings continue, call your medical doctor.  °Wound Care: You may have surgical glue, steri-strips, or staples over your incisions after surgery °• Surgical glue: Looks like clear  film over your incisions and will wear off a little at a time °• Steri-strips: Adhesive strips of tape over your incisions. You may notice a yellowish color on skin under the steri-strips. This is used to make the steri-strips stick better. Do not pull the steri-strips off - let them fall off °• Staples: Staples may be removed before you leave the hospital °o If you go home with staples, call [facility] for an appointment with your surgeon’s nurse to have staples removed 10 days after surgery, [phone] °• Showering: You may shower two (2) days after your surgery unless your surgeon tells you differently °o Wash gently around incisions with warm soapy water, rinse well, and gently pat dry °o If you have a drain (tube from your incision), you may need someone to hold this while you shower °o No tub baths until staples are removed and incisions are healed °  °Medications: • Medications should be liquid or crushed if larger than the size of a dime °• Extended release pills (medication that releases a little bit at a time through the  day) should not be crushed °• Depending on the size and number of medications you take, you may need to space (take a few throughout the day)/change the time you take your medications so that you do not over-fill your pouch (smaller stomach) °• Make sure you follow-up with you primary care physician to make medication changes needed during rapid weight loss and life -style changes °•   If you have diabetes, follow up with your doctor that orders your diabetes medication(s) within one week after surgery and check your blood sugar regularly   Do not drive while taking narcotics (pain medications)   Do not take acetaminophen (Tylenol) and Roxicet or Lortab Elixir at the same time since these pain medications contain acetaminophen   Diet:  First 2 Weeks You will see the nutritionist about two (2) weeks after your surgery. The nutritionist will increase the types of  foods you can eat if you are handling liquids well:  If you have severe vomiting or nausea and cannot handle clear liquids lasting longer than 1 day call your surgeon Protein Shake  Drink at least 2 ounces of shake 5-6 times per day  Each serving of protein shakes (usually 8-12 ounces) should have a minimum of: o 15 grams of protein o And no more than 5 grams of carbohydrate  Goal for protein each day: o Men = 80 grams per day o Women = 60 grams per day     Protein powder may be added to fluids such as non-fat milk or Lactaid milk or Soy milk (limit to 35 grams added protein powder per serving)  Hydration  Slowly increase the amount of water and other clear liquids as tolerated (See Acceptable Fluids)  Slowly increase the amount of protein shake as tolerated  Sip fluids slowly and throughout the day  May use sugar substitutes in small amounts (no more than 6-8 packets per day; i.e. Splenda)  Fluid Goal  The first goal is to drink at least 8 ounces of protein shake/drink per day (or as directed by the nutritionist); some examples of protein shakes are [employer], [employer], EAS Edge HP, and Unjury. - See handout from pre-op Bariatric Education Class: o Slowly increase the amount of protein shake you drink as tolerated o You may find it easier to slowly sip shakes throughout the day o It is important to get your proteins in first  Your fluid goal is to drink 64-100 ounces of fluid daily o It may take a few weeks to build up to this   32 oz. (or more) should be clear liquids And  32 oz. (or more) should be full liquids (see below for examples)  Liquids should not contain sugar, caffeine, or carbonation  Clear Liquids:  Water of Sugar-free flavored water (i.e. Fruit HO, Propel)  Decaffeinated coffee or tea (sugar-free)  Crystal lite, Wylers Lite, Minute Maid Lite  Sugar-free Jell-O  Bouillon or broth  Sugar-free Popsicle:    - Less than 20 calories  each; Limit 1 per day  Full Liquids:                   Protein Shakes/Drinks + 2 choices per day of other full liquids  Full liquids must be: o No More Than 12 grams of Carbs per serving o No More Than 3 grams of Fat per serving  Strained low-fat cream soup  Non-Fat milk  Fat-free Lactaid Milk  Sugar-free yogurt (Dannon Lite & Fit, Greek yogurt)    Vitamins and Minerals  Start 1 day after surgery unless otherwise directed by your surgeon  2 Chewable Multivitamin / Multimineral Supplement with iron  Chewable Calcium Citrate with Vitamin D-3 (Example: 3 Chewable Calcium  Plus 600 with Vitamin D-3) o Take 500 mg three (3) times a day for a total of 1500 mg each day o Do not take all 3 doses of calcium at  one time as it may cause constipation, and you can only absorb 500 mg at a time o Do not mix multivitamins containing iron with calcium supplements;  take 2 hours apart  Menstruating women and those at risk for anemia ( a blood disease that causes weakness) may need extra iron o Talk to your doctor to see if you need more iron  If you need extra iron: Total daily Iron recommendation (including Vitamins) is 50 to 100 mg Iron/day  Do not stop taking or change any vitamins or minerals until you talk to your nutritionist or surgeon  Your nutritionist and/or surgeon must approve all vitamin and mineral supplements   Activity and Exercise: It is important to continue walking at home. Limit your physical activity as instructed by your doctor. During this time, use these guidelines:  Do not lift anything greater than ten  (10) pounds for at least two (2) weeks  Do not go back to work or drive until Engineer, production says you can  You may have sex when you feel comfortable o It is VERY important for female patients to use a reliable birth control method; fertility often increase after surgery o Do not get pregnant for at least 18 months  Start exercising as soon as your doctor tells  you that you can o Make sure your doctor approves any physical activity  Start with a simple walking program  Walk 5-15 minutes each day, 7 days per week  Slowly increase until you are walking 30-45 minutes per day  Consider joining our [place] program. [phone] or email [email or web address]   Special Instructions Things to remember:  Free counseling is available for you and your family through collaboration between [facility] and [facility]. Please call [phone] and leave a message  Use your CPAP when sleeping if this applies to you  Consider buying a medical alert bracelet that says you had lap-band surgery     You will likely have your first fill (fluid added to your band) 6 - 8 weeks after surgery  [facility] has a free Bariatric Surgery Support Group that meets monthly, the 3rd Thursday, [place]. You can see classes online at [email or web address]  It is very important to keep all follow up appointments with your surgeon, nutritionist, primary care physician, and behavioral health practitioner o After the first year, please follow up with your bariatric surgeon and nutritionist at least once a year in order to maintain best weight loss results                    [facility]:  [place]: [phone]               Bariatric Nurse Coordinator: [phone]  Gastric Bypass/Sleeve Home Care Instructions  Rev. 01/2013                                                         Reviewed and Vilinda Boehringer  by [facility] Patient Education Committee, Jan, 2014 ° ° ° ° ° ° ° ° ° °

## 2017-05-24 NOTE — Transfer of Care (Signed)
Immediate Anesthesia Transfer of Care Note  Patient: [name] [name]  Procedure(s) Performed: Procedure(s): LAPAROSCOPIC GASTRIC SLEEVE RESECTION WITH UPPER ENDO (N/A) UPPER GI ENDOSCOPY LAPAROSCOPIC LYSIS OF ADHESIONS  Patient Location: PACU  Anesthesia Type:General  Level of Consciousness: sedated  Airway & Oxygen Therapy: Patient Spontanous Breathing and Patient connected to face mask oxygen  Post-op Assessment: Report given to RN and Post -op Vital signs reviewed and stable  Post vital signs: Reviewed and stable  Last Vitals:  Vitals:   05/24/17 0544  BP: (!) 128/59  Pulse: (!) 53  Resp: 16  Temp: 36.6 C    Last Pain:  Vitals:   05/24/17 0544  TempSrc: Oral      Patients Stated Pain Goal: 3 (74/25/95 [id number])  Complications: No apparent anesthesia complications

## 2017-05-25 ENCOUNTER — Encounter (HOSPITAL_COMMUNITY): Payer: Self-pay | Admitting: General Surgery

## 2017-05-25 LAB — CBC WITH DIFFERENTIAL/PLATELET
Basophils Absolute: 0 10*3/uL (ref 0.0–0.1)
Basophils Relative: 0 %
EOS PCT: 0 %
Eosinophils Absolute: 0 10*3/uL (ref 0.0–0.7)
HCT: 32.2 % — ABNORMAL LOW (ref 36.0–46.0)
Hemoglobin: 10.2 g/dL — ABNORMAL LOW (ref 12.0–15.0)
LYMPHS ABS: 1.7 10*3/uL (ref 0.7–4.0)
LYMPHS PCT: 17 %
MCH: 27.1 pg (ref 26.0–34.0)
MCHC: 31.7 g/dL (ref 30.0–36.0)
MCV: 85.6 fL (ref 78.0–100.0)
MONOS PCT: 10 %
Monocytes Absolute: 1.1 10*3/uL — ABNORMAL HIGH (ref 0.1–1.0)
Neutro Abs: 7.4 10*3/uL (ref 1.7–7.7)
Neutrophils Relative %: 73 %
PLATELETS: 278 10*3/uL (ref 150–400)
RBC: 3.76 MIL/uL — ABNORMAL LOW (ref 3.87–5.11)
RDW: 14.5 % (ref 11.5–15.5)
WBC: 10.1 10*3/uL (ref 4.0–10.5)

## 2017-05-25 LAB — COMPREHENSIVE METABOLIC PANEL
ALT: 9 U/L — AB (ref 14–54)
ANION GAP: 7 (ref 5–15)
AST: 11 U/L — ABNORMAL LOW (ref 15–41)
Albumin: 3.2 g/dL — ABNORMAL LOW (ref 3.5–5.0)
Alkaline Phosphatase: 61 U/L (ref 38–126)
BUN: 8 mg/dL (ref 6–20)
CHLORIDE: 108 mmol/L (ref 101–111)
CO2: 26 mmol/L (ref 22–32)
CREATININE: 0.95 mg/dL (ref 0.44–1.00)
Calcium: 8.6 mg/dL — ABNORMAL LOW (ref 8.9–10.3)
Glucose, Bld: 114 mg/dL — ABNORMAL HIGH (ref 65–99)
POTASSIUM: 3.7 mmol/L (ref 3.5–5.1)
Sodium: 141 mmol/L (ref 135–145)
Total Bilirubin: 0.6 mg/dL (ref 0.3–1.2)
Total Protein: 5.7 g/dL — ABNORMAL LOW (ref 6.5–8.1)

## 2017-05-25 LAB — GLUCOSE, CAPILLARY: Glucose-Capillary: 108 mg/dL — ABNORMAL HIGH (ref 65–99)

## 2017-05-25 MED ORDER — APIXABAN 2.5 MG PO TABS: 2.5000 mg | ORAL_TABLET | Freq: Two times a day (BID) | ORAL | 0 refills | Status: DC

## 2017-05-25 MED ORDER — METFORMIN HCL 500 MG PO TABS: 500.0000 mg | ORAL_TABLET | Freq: Two times a day (BID) | ORAL | 1 refills | Status: DC

## 2017-05-25 NOTE — Discharge Summary (Signed)
Physician Discharge Summary  [name] [name] ACZ:[id number] DOB: April 24, 1971 DOA: 05/24/2017  PCP: Maurice Small, MD  Admit date: 05/24/2017 Discharge date: 05/25/2017  Recommendations for Outpatient Follow-up:  1.  (include homehealth, outpatient follow-up instructions, specific recommendations for PCP to follow-up on, etc.)  Follow-up Information    Terral Cooks, Arta Bruce, MD. Go on 06/08/2017.   Specialty:  General Surgery Why:  at 1200 Contact information: [place] [address] [phone]        Kieren Adkison, Arta Bruce, MD Follow up.   Specialty:  General Surgery Contact information: [place] [address] [phone]          Discharge Diagnoses:  Active Problems:   Morbid obesity ([place])   Surgical Procedure: Laparoscopic Sleeve Gastrectomy, upper endoscopy  Discharge Condition: Good Disposition: Home  Diet recommendation: Postoperative sleeve gastrectomy diet (liquids only)  Filed Weights   05/24/17 0544  Weight: 93.7 kg (206 lb 8 oz)     Hospital Course:  The patient was admitted after undergoing laparoscopic sleeve gastrectomy. POD 0 she ambulated well. POD 1 she was started on the water diet protocol and tolerated 400 ml in the first shift. Once meeting the water amount she was advanced to bariatric protein shakes which they tolerated and were discharged home POD 1.  Treatments: surgery: laparoscopic sleeve gastrectomy  Discharge Instructions  Discharge Instructions    Ambulate hourly while awake    Complete by:  As directed    Call MD for:  difficulty breathing, headache or visual disturbances    Complete by:  As directed    Call MD for:  persistant dizziness or light-headedness    Complete by:  As directed    Call MD for:  persistant nausea and vomiting    Complete by:  As directed    Call MD for:  redness, tenderness, or signs of infection (pain, swelling, redness, odor or green/yellow discharge  around incision site)    Complete by:  As directed    Call MD for:  severe uncontrolled pain    Complete by:  As directed    Call MD for:  temperature >101 F    Complete by:  As directed    Diet bariatric full liquid    Complete by:  As directed    Discharge wound care:    Complete by:  As directed    Remove Bandaids tomorrow, ok to shower tomorrow. Steristrips may fall off in 1-3 weeks.   Incentive spirometry    Complete by:  As directed    Perform hourly while awake     Allergies as of 05/25/2017      Reactions   Augmentin [amoxicillin-pot Clavulanate] Itching, Swelling, Rash, Other (See Comments)   Has patient had a PCN reaction causing immediate rash, facial/tongue/throat swelling, SOB or lightheadedness with hypotension: Yes Has patient had a PCN reaction causing severe rash involving mucus membranes or skin necrosis: No Has patient had a PCN reaction that required hospitalization No Has patient had a PCN reaction occurring within the last 10 years: Yes If all of the above answers are "NO", then may proceed with Cephalosporin use.   Enoxaparin Hives   Avelox [moxifloxacin Hcl In Nacl] Itching, Swelling, Rash, Other (See Comments)   Penicillins Itching, Swelling, Rash   Has patient had a PCN reaction causing immediate rash, facial/tongue/throat swelling, SOB or lightheadedness with hypotension: Yes Has patient had a PCN reaction causing severe rash involving mucus membranes or  skin necrosis: No Has patient had a PCN reaction that required hospitalization: Already in hospital when reaction happened Has patient had a PCN reaction occurring within the last 10 years: Unknown If all of the above answers are "NO", then may proceed with Cephalosporin use.   Sodium Hydroxide Rash      Medication List    STOP taking these medications   furosemide 20 MG tablet Commonly known as:  LASIX   metFORMIN 500 MG 24 hr tablet Commonly known as:  GLUCOPHAGE-XR Replaced by:  metFORMIN 500 MG  tablet     TAKE these medications   apixaban 2.5 MG Tabs tablet Commonly known as:  ELIQUIS Take 1 tablet (2.5 mg total) by mouth 2 (two) times daily.   busPIRone 15 MG tablet Commonly known as:  BUSPAR Take 15 mg by mouth 3 (three) times daily.   CALCIUM PO Take 1 tablet by mouth daily.   metFORMIN 500 MG tablet Commonly known as:  GLUCOPHAGE Take 1 tablet (500 mg total) by mouth 2 (two) times daily with a meal. Replaces:  metFORMIN 500 MG 24 hr tablet   metoprolol tartrate 25 MG tablet Commonly known as:  LOPRESSOR Take 25 mg by mouth 2 (two) times daily. Notes to patient:  Monitor Blood Sugar Frequently and keep a log for primary care physician, you may need to adjust medication dosage with rapid weight loss.     multivitamin with minerals Tabs tablet Take 1 tablet by mouth daily.   omeprazole 20 MG capsule Commonly known as:  PRILOSEC Take 40 mg by mouth daily with breakfast.   ondansetron 8 MG tablet Commonly known as:  ZOFRAN Take 8 mg by mouth every 8 (eight) hours as needed for nausea or vomiting.   predniSONE 20 MG tablet Commonly known as:  DELTASONE Take 2 tablets (40 mg total) by mouth daily with breakfast.   traMADol 50 MG tablet Commonly known as:  ULTRAM Take 50 mg by mouth at bedtime as needed for moderate pain or severe pain.   traZODone 100 MG tablet Commonly known as:  DESYREL Take 150 mg by mouth at bedtime.      Follow-up Information    Veronica Fretz, Arta Bruce, MD. Go on 06/08/2017.   Specialty:  General Surgery Why:  at 1200 Contact information: [place] [address] [phone]        Adrinne Sze, Arta Bruce, MD Follow up.   Specialty:  General Surgery Contact information: [place] [address] [phone]            The results of significant diagnostics from this hospitalization (including imaging, microbiology, ancillary and laboratory) are listed below for reference.     Significant Diagnostic Studies: Dg Chest 2 View  Result Date: 05/19/2017 CLINICAL DATA:  Bilateral pleural effusions. EXAM: CHEST  2 VIEW COMPARISON:  Radiographs of May 02, 2017. FINDINGS: The heart size and mediastinal contours are within normal limits. Both lungs are clear. No pneumothorax is noted. Small right pleural effusion is noted which is stable compared to prior exam. The visualized skeletal structures are unremarkable. IMPRESSION: Stable small right pleural effusion. No other abnormality seen in the chest. Electronically Signed   By: Marijo Conception, M.D.   On: 05/19/2017 09:26   Dg Chest 2 View  Result Date: 05/02/2017 CLINICAL DATA:  Followup left pleural effusion.  Chest pain EXAM: CHEST  2 VIEW COMPARISON:  CT chest 04/26/2017.  Chest x-ray 04/26/2017 FINDINGS: Heart  size and vascularity normal. Negative for heart failure. Minimal pleural effusion on the lateral view. On the recent chest CT, the right effusion was larger than a small left effusion. Negative for mass or pneumonia. IMPRESSION: Small pleural effusion unchanged.  Otherwise the lungs are clear. Electronically Signed   By: Franchot Gallo M.D.   On: 05/02/2017 09:57    Labs: Basic Metabolic Panel:  Recent Labs Lab 05/24/17 1106 05/25/17 0452  NA 141 141  K 3.7 3.7  CL 106 108  CO2 25 26  GLUCOSE 174* 114*  BUN 14 8  CREATININE 1.05* 0.95  CALCIUM 8.6* 8.6*   Liver Function Tests:  Recent Labs Lab 05/24/17 1106 05/25/17 0452  AST 19 11*  ALT 11* 9*  ALKPHOS 68 61  BILITOT 0.5 0.6  PROT 6.4* 5.7*  ALBUMIN 3.6 3.2*    CBC:  Recent Labs Lab 05/24/17 1106 05/25/17 0452  WBC 14.8* 10.1  NEUTROABS 13.7* 7.4  HGB 12.0  12.0 10.2*  HCT 38.0  38.0 32.2*  MCV 85.0 85.6  PLT 281 278    CBG:  Recent Labs Lab 05/24/17 0950 05/24/17 1240 05/24/17 1728 05/24/17 2137 05/25/17 0719  GLUCAP 169* 161* 145* 127* 108*    Active Problems:   Morbid obesity ([place])   Time coordinating  discharge: <65min

## 2017-05-25 NOTE — Progress Notes (Signed)
Discharge instructions discussed with patient, verbalized agreement and understanding, 

## 2017-05-25 NOTE — Progress Notes (Signed)
Patient alert and oriented, Post op day 1.  Provided support and encouragement.  Encouraged pulmonary toilet, ambulation and small sips of liquids.  All questions answered.  Will continue to monitor. 

## 2017-05-25 NOTE — Progress Notes (Signed)
Patient alert and oriented, pain is controlled. Patient is tolerating fluids, advanced to protein shake today, patient is tolerating well.  Has tolerated 6 ounces of [place] without nausea or vomiting.  Ambulated several times in the hallway.   Reviewed Gastric sleeve discharge instructions with patient and patient is able to articulate understanding.  Provided information on BELT program, Support Group and [facility] outpatient pharmacy. All questions answered, will continue to monitor.

## 2017-05-25 NOTE — Progress Notes (Signed)
Progress Note: Metabolic and Bariatric Surgery Service   Chief Complaint/Subjective: Some abdominal pain and hunger pains, ambulating well, no nausea, tolerating >473ml and 4oz shake  Objective: Vital signs in last 24 hours: Temp:  [97.7 F (36.5 C)-99.2 F (37.3 C)] 98.5 F (36.9 C) (05/30 0510) Pulse Rate:  [58-87] 58 (05/30 0510) Resp:  [16-18] 16 (05/30 0510) BP: (112-133)/(59-71) 133/71 (05/30 0510) SpO2:  [93 %-100 %] 95 % (05/30 0510)    Intake/Output from previous day: 05/29 0701 - 05/30 0700 In: 4410 [P.O.:360; I.V.:4050] Out: 3650 [Urine:3600; Blood:50] Intake/Output this shift: No intake/output data recorded.  Lungs: CTAB  Cardiovascular: bradycardic  Abd: soft, NT, ND, incisions c/d/i  Extremities: no edema  Neuro: AOx4  Lab Results: CBC   Recent Labs  05/24/17 1106 05/25/17 0452  WBC 14.8* 10.1  HGB 12.0  12.0 10.2*  HCT 38.0  38.0 32.2*  PLT 281 278   BMET  Recent Labs  05/24/17 1106 05/25/17 0452  NA 141 141  K 3.7 3.7  CL 106 108  CO2 25 26  GLUCOSE 174* 114*  BUN 14 8  CREATININE 1.05* 0.95  CALCIUM 8.6* 8.6*   PT/INR No results for input(s): LABPROT, INR in the last 72 hours. ABG No results for input(s): PHART, HCO3 in the last 72 hours.  Invalid input(s): PCO2, PO2  Studies/Results:  Anti-infectives: Anti-infectives    Start     Dose/Rate Route Frequency Ordered Stop   05/24/17 0646  clindamycin (CLEOCIN) 900 MG/50ML IVPB    Comments:  Harle Stanford   : cabinet override      05/24/17 0646 05/24/17 0722   05/24/17 0600  clindamycin (CLEOCIN) IVPB 900 mg     900 mg 100 mL/hr over 30 Minutes Intravenous On call to O.R. 05/24/17 4259 05/24/17 0722   05/24/17 0600  gentamicin (GARAMYCIN) 340 mg in dextrose 5 % 100 mL IVPB     340 mg 108.5 mL/hr over 60 Minutes Intravenous On call 05/23/17 1334 05/24/17 0730      Medications: Scheduled Meds: . busPIRone  15 mg Oral TID  . heparin subcutaneous  5,000 Units  Subcutaneous Q8H  . insulin aspart  0-15 Units Subcutaneous TID WC  . metoprolol tartrate  25 mg Oral BID  . pantoprazole (PROTONIX) IV  40 mg Intravenous QHS  . [START ON 05/26/2017] protein supplement shake  2 oz Oral Q2H  . traZODone  150 mg Oral QHS   Continuous Infusions: . sodium chloride 1,000 mL (05/24/17 2311)   PRN Meds:.oxyCODONE **AND** acetaminophen, acetaminophen, morphine injection, ondansetron (ZOFRAN) IV  Assessment/Plan: Patient Active Problem List   Diagnosis Date Noted  . Morbid obesity ([place]) 05/24/2017  . Pleuritis 05/19/2017  . Pleural effusion 05/19/2017  . Schizoaffective disorder, depressive type ([name]) 02/04/2017  . Gastroesophageal reflux 01/24/2017  . Obesity, Class II, BMI 35-39.9 01/24/2017  . Suicidal behavior with attempted self-injury ([place]) 01/24/2017  . HTN (hypertension) 03/30/2016  . Serotonin syndrome 03/30/2016  . Elevated blood pressure 03/30/2016  . Leukocytosis 03/30/2016  . Precordial chest pain 03/30/2016  . Incisional hernia 12/24/2015  . Intentional drug overdose ([place])   . Hyperprolactinemia ([place]) 09/11/2015  . Schizoaffective disorder ([place]) 09/10/2015  . Hx of borderline personality disorder 09/10/2015  . Post traumatic stress disorder (PTSD) 09/10/2015  . Attempted suicide ([place]) 09/05/2015  . Overdose, drug 09/05/2015  . Overdose 09/05/2015  . Drug overdose   . Prolonged Q-T interval on ECG   . Weight gain 03/06/2015  . [place] 03/06/2015  .  Severe episode of recurrent major depressive disorder ([place]) 08/23/2014  . Iron deficiency anemia 11/15/2013  . Alcohol dependence ([place]) 06/02/2013  . Liver mass 05/28/2013  . Medication side effects 05/07/2013  . Headache 04/03/2013  . Hx MRSA infection 04/03/2013  . Postop check 03/13/2013  . Postoperative wound infection 02/23/2013  . History of colostomy 02/16/2013  . Acute blood loss anemia 08/01/2012  . Kidney stone 03/31/2012  . Hydronephrosis 03/31/2012   s/p  Procedure(s): LAPAROSCOPIC GASTRIC SLEEVE RESECTION WITH UPPER ENDO UPPER GI ENDOSCOPY LAPAROSCOPIC LYSIS OF ADHESIONS 05/24/2017 -doing well -education  Disposition:  LOS: 1 day  The patient should be discharged from the hospital today  Mickeal Skinner, MD [phone] [facility]

## 2017-05-28 ENCOUNTER — Other Ambulatory Visit: Payer: Self-pay

## 2017-05-28 ENCOUNTER — Encounter (HOSPITAL_COMMUNITY): Payer: Self-pay

## 2017-05-28 ENCOUNTER — Inpatient Hospital Stay (HOSPITAL_COMMUNITY)
Admission: EM | Admit: 2017-05-28 | Discharge: 2017-05-30 | DRG: 373 | Disposition: A | Discharge status: Home / Self Care | Payer: Medicare Other | Attending: General Surgery | Admitting: General Surgery

## 2017-05-28 ENCOUNTER — Other Ambulatory Visit (HOSPITAL_COMMUNITY): Payer: Self-pay

## 2017-05-28 ENCOUNTER — Emergency Department (HOSPITAL_COMMUNITY): Payer: Medicare Other

## 2017-05-28 DIAGNOSIS — G8918 Other acute postprocedural pain: Secondary | ICD-10-CM

## 2017-05-28 DIAGNOSIS — K219 Gastro-esophageal reflux disease without esophagitis: Secondary | ICD-10-CM | POA: Diagnosis present

## 2017-05-28 DIAGNOSIS — Z7984 Long term (current) use of oral hypoglycemic drugs: Secondary | ICD-10-CM | POA: Diagnosis not present

## 2017-05-28 DIAGNOSIS — Z8249 Family history of ischemic heart disease and other diseases of the circulatory system: Secondary | ICD-10-CM

## 2017-05-28 DIAGNOSIS — Z880 Allergy status to penicillin: Secondary | ICD-10-CM | POA: Diagnosis not present

## 2017-05-28 DIAGNOSIS — R109 Unspecified abdominal pain: Secondary | ICD-10-CM | POA: Diagnosis not present

## 2017-05-28 DIAGNOSIS — R197 Diarrhea, unspecified: Secondary | ICD-10-CM | POA: Diagnosis not present

## 2017-05-28 DIAGNOSIS — E119 Type 2 diabetes mellitus without complications: Secondary | ICD-10-CM | POA: Diagnosis present

## 2017-05-28 DIAGNOSIS — Z888 Allergy status to other drugs, medicaments and biological substances status: Secondary | ICD-10-CM

## 2017-05-28 DIAGNOSIS — Z7952 Long term (current) use of systemic steroids: Secondary | ICD-10-CM | POA: Diagnosis not present

## 2017-05-28 DIAGNOSIS — A0472 Enterocolitis due to Clostridium difficile, not specified as recurrent: Secondary | ICD-10-CM | POA: Diagnosis not present

## 2017-05-28 DIAGNOSIS — Z8051 Family history of malignant neoplasm of kidney: Secondary | ICD-10-CM

## 2017-05-28 DIAGNOSIS — I10 Essential (primary) hypertension: Secondary | ICD-10-CM | POA: Diagnosis present

## 2017-05-28 DIAGNOSIS — Z7901 Long term (current) use of anticoagulants: Secondary | ICD-10-CM | POA: Diagnosis not present

## 2017-05-28 DIAGNOSIS — Z79899 Other long term (current) drug therapy: Secondary | ICD-10-CM | POA: Diagnosis not present

## 2017-05-28 HISTORY — DX: Poisoning by unspecified drugs, medicaments and biological substances, accidental (unintentional), initial encounter: T50.901A

## 2017-05-28 HISTORY — DX: Poisoning by unspecified drugs, medicaments and biological substances, intentional self-harm, initial encounter: T50.902A

## 2017-05-28 LAB — COMPREHENSIVE METABOLIC PANEL
ALK PHOS: 82 U/L (ref 38–126)
ALT: 17 U/L (ref 14–54)
AST: 15 U/L (ref 15–41)
Albumin: 3.5 g/dL (ref 3.5–5.0)
Anion gap: 7 (ref 5–15)
BUN: 13 mg/dL (ref 6–20)
CALCIUM: 9.1 mg/dL (ref 8.9–10.3)
CO2: 29 mmol/L (ref 22–32)
CREATININE: 1.1 mg/dL — AB (ref 0.44–1.00)
Chloride: 105 mmol/L (ref 101–111)
GFR calc Af Amer: >60 mL/min (ref >60)
GFR calc non Af Amer: 59 mL/min — ABNORMAL LOW (ref >60)
GLUCOSE: 105 mg/dL — AB (ref 65–99)
Potassium: 3.5 mmol/L (ref 3.5–5.1)
SODIUM: 141 mmol/L (ref 135–145)
Total Bilirubin: 0.4 mg/dL (ref 0.3–1.2)
Total Protein: 6.5 g/dL (ref 6.5–8.1)

## 2017-05-28 LAB — LIPASE, BLOOD: Lipase: 35 U/L (ref 11–51)

## 2017-05-28 LAB — URINALYSIS, ROUTINE W REFLEX MICROSCOPIC
Bilirubin Urine: NEGATIVE
Glucose, UA: NEGATIVE mg/dL
Hgb urine dipstick: NEGATIVE
Ketones, ur: 20 mg/dL — AB
Nitrite: NEGATIVE
PH: 6 (ref 5.0–8.0)
Protein, ur: NEGATIVE mg/dL
SPECIFIC GRAVITY, URINE: 1.024 (ref 1.005–1.030)

## 2017-05-28 LAB — CBC
HCT: 35.5 % — ABNORMAL LOW (ref 36.0–46.0)
Hemoglobin: 11.3 g/dL — ABNORMAL LOW (ref 12.0–15.0)
MCH: 27.4 pg (ref 26.0–34.0)
MCHC: 31.8 g/dL (ref 30.0–36.0)
MCV: 86 fL (ref 78.0–100.0)
PLATELETS: 321 10*3/uL (ref 150–400)
RBC: 4.13 MIL/uL (ref 3.87–5.11)
RDW: 14.3 % (ref 11.5–15.5)
WBC: 10.6 10*3/uL — ABNORMAL HIGH (ref 4.0–10.5)

## 2017-05-28 LAB — I-STAT BETA HCG BLOOD, ED (MC, WL, AP ONLY): I-stat hCG, quantitative: <5 m[IU]/mL (ref <5)

## 2017-05-28 LAB — C DIFFICILE QUICK SCREEN W PCR REFLEX
C DIFFICLE (CDIFF) ANTIGEN: POSITIVE — AB
C Diff toxin: NEGATIVE

## 2017-05-28 LAB — CBG MONITORING, ED: Glucose-Capillary: 92 mg/dL (ref 65–99)

## 2017-05-28 LAB — GLUCOSE, CAPILLARY: Glucose-Capillary: 81 mg/dL (ref 65–99)

## 2017-05-28 MED ORDER — TRAZODONE HCL 50 MG PO TABS
50.0000 mg | ORAL_TABLET | ORAL | Status: DC
  Administered 2017-05-29: 50 mg via ORAL
  Filled 2017-05-28: qty 1

## 2017-05-28 MED ORDER — TRAZODONE HCL 50 MG PO TABS
200.0000 mg | ORAL_TABLET | Freq: Every day | ORAL | Status: DC
  Administered 2017-05-29 (×2): 200 mg via ORAL
  Filled 2017-05-28 (×2): qty 4

## 2017-05-28 MED ORDER — PREMIER PROTEIN SHAKE
11.0000 [oz_av] | ORAL | Status: AC
  Administered 2017-05-28: 11 [oz_av] via ORAL
  Filled 2017-05-28: qty 325.31

## 2017-05-28 MED ORDER — SODIUM CHLORIDE 0.9 % IV BOLUS (SEPSIS)
1000.0000 mL | Freq: Once | INTRAVENOUS | Status: AC
  Administered 2017-05-28: 1000 mL via INTRAVENOUS

## 2017-05-28 MED ORDER — IOPAMIDOL (ISOVUE-300) INJECTION 61%
100.0000 mL | Freq: Once | INTRAVENOUS | Status: AC | PRN
  Administered 2017-05-28: 100 mL via INTRAVENOUS

## 2017-05-28 MED ORDER — MORPHINE SULFATE (PF) 2 MG/ML IV SOLN
2.0000 mg | INTRAVENOUS | Status: DC | PRN
  Administered 2017-05-28: 2 mg via INTRAVENOUS
  Filled 2017-05-28: qty 1

## 2017-05-28 MED ORDER — VANCOMYCIN 50 MG/ML ORAL SOLUTION
125.0000 mg | Freq: Four times a day (QID) | ORAL | Status: DC
  Administered 2017-05-29 (×5): 125 mg via ORAL
  Filled 2017-05-28 (×6): qty 2.5

## 2017-05-28 MED ORDER — IOPAMIDOL (ISOVUE-300) INJECTION 61%
INTRAVENOUS | Status: AC
  Filled 2017-05-28: qty 100

## 2017-05-28 MED ORDER — MORPHINE SULFATE (PF) 2 MG/ML IV SOLN
2.0000 mg | INTRAVENOUS | Status: DC | PRN
  Administered 2017-05-29: 16:00:00 2 mg via INTRAVENOUS
  Filled 2017-05-28: qty 1

## 2017-05-28 MED ORDER — HYDROMORPHONE HCL 1 MG/ML IJ SOLN
1.0000 mg | Freq: Once | INTRAMUSCULAR | Status: AC
  Administered 2017-05-28: 1 mg via INTRAVENOUS
  Filled 2017-05-28: qty 1

## 2017-05-28 MED ORDER — PREDNISONE 20 MG PO TABS
40.0000 mg | ORAL_TABLET | Freq: Every day | ORAL | Status: DC
  Administered 2017-05-29: 08:00:00 40 mg via ORAL
  Filled 2017-05-28 (×2): qty 2

## 2017-05-28 MED ORDER — INSULIN ASPART 100 UNIT/ML ~~LOC~~ SOLN
0.0000 [IU] | Freq: Three times a day (TID) | SUBCUTANEOUS | Status: DC
  Administered 2017-05-29: 13:00:00 2 [IU] via SUBCUTANEOUS

## 2017-05-28 MED ORDER — POTASSIUM CHLORIDE IN NACL 20-0.9 MEQ/L-% IV SOLN
INTRAVENOUS | Status: DC
  Administered 2017-05-28 – 2017-05-29 (×3): via INTRAVENOUS
  Filled 2017-05-28 (×5): qty 1000

## 2017-05-28 MED ORDER — FENTANYL CITRATE (PF) 100 MCG/2ML IJ SOLN
75.0000 ug | Freq: Once | INTRAMUSCULAR | Status: AC
  Administered 2017-05-28: 75 ug via INTRAVENOUS
  Filled 2017-05-28: qty 2

## 2017-05-28 MED ORDER — ONDANSETRON HCL 4 MG/2ML IJ SOLN
4.0000 mg | Freq: Once | INTRAMUSCULAR | Status: AC
  Administered 2017-05-28: 4 mg via INTRAVENOUS
  Filled 2017-05-28: qty 2

## 2017-05-28 MED ORDER — BUSPIRONE HCL 5 MG PO TABS
15.0000 mg | ORAL_TABLET | Freq: Three times a day (TID) | ORAL | Status: DC
  Administered 2017-05-29 (×4): 15 mg via ORAL
  Filled 2017-05-28 (×4): qty 3

## 2017-05-28 MED ORDER — IOPAMIDOL (ISOVUE-300) INJECTION 61%
15.0000 mL | Freq: Once | INTRAVENOUS | Status: AC | PRN
  Administered 2017-05-28: 15 mL via ORAL

## 2017-05-28 MED ORDER — PNEUMOCOCCAL VAC POLYVALENT 25 MCG/0.5ML IJ INJ
0.5000 mL | INJECTION | INTRAMUSCULAR | Status: DC
  Filled 2017-05-28: qty 0.5

## 2017-05-28 MED ORDER — PANTOPRAZOLE SODIUM 40 MG IV SOLR
40.0000 mg | Freq: Every day | INTRAVENOUS | Status: DC
  Administered 2017-05-29 (×2): 40 mg via INTRAVENOUS
  Filled 2017-05-28 (×2): qty 40

## 2017-05-28 MED ORDER — ADULT MULTIVITAMIN W/MINERALS CH: 1.0000 | ORAL_TABLET | Freq: Every day | ORAL | Status: DC

## 2017-05-28 MED ORDER — OXYCODONE HCL 5 MG/5ML PO SOLN
5.0000 mg | ORAL | Status: DC | PRN
  Administered 2017-05-29 (×4): 5 mg via ORAL
  Filled 2017-05-28 (×4): qty 5

## 2017-05-28 MED ORDER — APIXABAN 2.5 MG PO TABS
2.5000 mg | ORAL_TABLET | Freq: Two times a day (BID) | ORAL | Status: DC
  Administered 2017-05-29 (×3): 2.5 mg via ORAL
  Filled 2017-05-28 (×3): qty 1

## 2017-05-28 MED ORDER — METOPROLOL TARTRATE 25 MG PO TABS
25.0000 mg | ORAL_TABLET | Freq: Two times a day (BID) | ORAL | Status: DC
  Administered 2017-05-29 (×3): 25 mg via ORAL
  Filled 2017-05-28 (×3): qty 1

## 2017-05-28 NOTE — ED Notes (Signed)
Pt denies n/v today but verbalizes multiple diarrhea episodes. Pt verbalizes able to tolerate a protein shake today.

## 2017-05-28 NOTE — ED Notes (Signed)
Subjective: Patient is 4 days status post laparoscopic sleeve gastrectomy for morbid obesity. Discharged postop day #1. Yesterday she developed the onset of some increased right-sided abdominal pain which she describes in her right mid abdomen radiating toward the right lower quadrant. This was associated with malaise as well. Today she noted some increased pain in the same area associated with nausea without vomiting. She describes the pain as a burning or stinging sensation. Also began to have frequent diarrhea and had 8-10 loose stools today. She felt increased malaise and felt like her heart was beating very rapidly and she called for advice. I asked her to come to the emergency department for evaluation.  Objective: Vital signs in last 24 hours: Temp:  [97.9 F (36.6 C)-98.4 F (36.9 C)] 98.4 F (36.9 C) (06/02 1555) Pulse Rate:  [56-88] 61 (06/02 1555) Resp:  [16-20] 16 (06/02 1555) BP: (109-142)/(50-85) 142/85 (06/02 1555) SpO2:  [97 %-99 %] 98 % (06/02 1555)    Intake/Output from previous day: No intake/output data recorded. Intake/Output this shift: Total I/O In: 1000 [IV Piggyback:1000] Out: -   General appearance: alert, cooperative and no distress Resp: clear to auscultation bilaterally and Without increased work of breathing Cardio: Regular rate and rhythm. No edema. GI: Moderate localized tenderness in the right mid abdomen which is immediately adjacent to her right mid abdominal incision. Remainder of abdomen soft and nontender. No distention. Normal bowel sounds. Incision/Wound: Clean and dry, no erythema or drainage  Lab Results:   Recent Labs  05/28/17 1011  WBC 10.6*  HGB 11.3*  HCT 35.5*  PLT 321   BMET  Recent Labs  05/28/17 1011  NA 141  K 3.5  CL 105  CO2 29  GLUCOSE 105*  BUN 13  CREATININE 1.10*  CALCIUM 9.1     Studies/Results: Ct Abdomen Pelvis W Contrast  Result Date: 05/28/2017 CLINICAL DATA:  Right lower quadrant abdominal  pain and diarrhea after sleeve gastrectomy last week. EXAM: CT ABDOMEN AND PELVIS WITH CONTRAST TECHNIQUE: Multidetector CT imaging of the abdomen and pelvis was performed using the standard protocol following bolus administration of intravenous contrast. CONTRAST:  29mL ISOVUE-300 IOPAMIDOL (ISOVUE-300) INJECTION 61%, <See Chart> ISOVUE-300 IOPAMIDOL (ISOVUE-300) INJECTION 61% COMPARISON:  11/02/2016 FINDINGS: Lower chest: Tiny bilateral pleural effusions noted with trace dependent atelectasis in the lower lobes bilaterally. Hepatobiliary: No focal abnormality within the liver parenchyma. There is no evidence for gallstones, gallbladder wall thickening, or pericholecystic fluid. No intrahepatic or extrahepatic biliary dilation. Pancreas: No focal mass lesion. No dilatation of the main duct. No intraparenchymal cyst. No peripancreatic edema. Spleen: No splenomegaly. No focal mass lesion. Adrenals/Urinary Tract: No adrenal nodule or mass. Stable appearance small cyst right kidney. Left kidney appears atrophied with areas of focal cortical scarring. 7 mm nonobstructing stone identified lower pole left kidney. Mild fullness left ureter is stable without ureteral stone. No bladder stone. Stomach/Bowel: Surgical suture line along the stomach is compatible with the reported history of sleeve gastrectomy on 05/24/2017 per notes in EPIC. Oral contrast material migrates through the stomach into small bowel. No evidence for contrast extravasation from the gastric suture line. Trace amount of fluid is identified along the posterior gastric suture line, not unexpected 4 days from surgery. No oral contrast material identified in the peritoneal cavity. Duodenum is normally positioned as is the ligament of Treitz. No small bowel wall thickening. No small bowel dilatation. The terminal ileum is normal. The appendix is normal. No gross colonic mass. No colonic wall thickening.  No substantial diverticular change. Vascular/Lymphatic:  No abdominal aortic aneurysm. No abdominal aortic atherosclerotic calcification. There is no gastrohepatic or hepatoduodenal ligament lymphadenopathy. No intraperitoneal or retroperitoneal lymphadenopathy. No pelvic sidewall lymphadenopathy. Reproductive: The uterus has normal CT imaging appearance. There is no adnexal mass. Other: No free fluid in the abdomen or pelvis. There is free fluid in the anterior peritoneal cavity the abdomen with some edema in the omentum and gastrocolic ligament. Musculoskeletal: A disc or lens shaped collection of fluid and gas is identified in the subcutaneous fat of the supraumbilical anterior abdominal wall. This thin collection measures 14 mm in craniocaudal extent by 13 mm and coronal extent but is only about 12 mm in AP thickness. Bone windows reveal no worrisome lytic or sclerotic osseous lesions. IMPRESSION: 1. Patient is postop day 4 from sleeve gastrectomy. There is free gas identified in the anterior abdomen, but this is not unexpected 4 days from surgery. There is no free fluid in the peritoneal cavity and there is no evidence for enteric contrast extravasation from the gastric suture line to suggest the presence of a leak. No apparent obstruction of contrast flow from the stomach into the small bowel as the small bowel and colon are well opacified and there is no residual contrast material in the gastric remnant. 2. A thin disc-shaped collection of fluid and gas is identified in the subcutaneous fat of the supraumbilical anterior abdominal wall, also most likely postsurgical and may be a seroma or hematoma. 3. Atrophy of the left kidney with mild left hydronephrosis/trace hydroureter, similar to prior studies. 4. Nonobstructing left renal stone. 5. Tiny bilateral pleural effusions was some compressive atelectasis in the dependent lower lobes bilaterally. Electronically Signed   By: Misty Stanley M.D.   On: 05/28/2017 15:39    Anti-infectives: Anti-infectives    None       Assessment/Plan: 4 days post laparoscopic sleeve gastrectomy. I reviewed her CT scan which shows no evidence of leak or problem with the sleeve. No other GI issues noted. She has a relatively benign abdominal exam and the tenderness seems located around the larger incision. She has diarrhea which could be resolving ileus but I think we need to rule out C. difficile colitis. With a negative CT and unremarkable lab and  benign abdominal exam I believe she would be safe for discharge and we could follow up results of the C. difficile test tomorrow. Will give her a small dose of IV pain medicine in the emergency department and we will see how she tolerates a protein shake. She has received an IV fluid bolus for possible mild dehydration. If pain is reasonably well controlled and she tolerates this sake I think she is okay for discharge home.    LOS: 0 days    Sharlot Sturkey T 05/28/2017
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
## 2017-05-28 NOTE — ED Notes (Signed)
Delay in administering pain medication related not in stock. Pharmacy verbalizes will walk up.

## 2017-05-28 NOTE — ED Triage Notes (Signed)
Pt had gastric sleeve on Tuesday.  For last couple of days, she has had rlq abdominal pain and diarrhea.  N/V last night.  No fever.[name]

## 2017-05-28 NOTE — ED Notes (Signed)
Pt tolerates entire shake 11 ounces without vomiting. Pt verbalizes nausea still decreased and pain stays at 6/10. Per Jeneen Rinks MD plan to discharge with sterile cup for stool collection. Pt verbalizes has not had stool since transferred to BED 14.

## 2017-05-28 NOTE — ED Notes (Signed)
Dr.James made aware of pt mental status at this time. No further orders given.

## 2017-05-28 NOTE — ED Notes (Addendum)
At time of discharge pt had syncopal episode and was caught before reaching floor and no head injury was involved. Pt was assisted back into bed and placed onto cardiac monitor. Provider to be notified. Charge nurse was at bedside.

## 2017-05-28 NOTE — ED Notes (Signed)
Pt ambulated to restroom without any difficulty.

## 2017-05-28 NOTE — Discharge Instructions (Signed)
-   Continue taking your pain medications as prescribed - Drink plenty of fluids over the next several days - Call Dr. Kieth Brightly to let him know of your ED visit and to arrange follow-up

## 2017-05-28 NOTE — ED Notes (Signed)
Pt aware a urine sample is needed. 
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
## 2017-05-28 NOTE — ED Provider Notes (Signed)
Patient care assumed from Dr. Ellender Hose. After fluids and meds she felt "better". Was able to tolerate protein shake. However upon sitting on the edge of the bed syncopal episode. He was kept in a safe position by nursing. Did not fall or strike her head or injure herself. Placed back on monitor. Upon my arrival patient not hypotensive or bradycardic. Complaining of recurrent nausea and abdominal pain. Discussed with Dr. Excell Seltzer, patient will be admitted for further fluids and symptomatic treatment.   Tanna Furry, MD 05/28/17 2005

## 2017-05-29 ENCOUNTER — Encounter (HOSPITAL_COMMUNITY): Payer: Self-pay | Admitting: Surgery

## 2017-05-29 DIAGNOSIS — A0472 Enterocolitis due to Clostridium difficile, not specified as recurrent: Secondary | ICD-10-CM

## 2017-05-29 LAB — BASIC METABOLIC PANEL
ANION GAP: 7 (ref 5–15)
BUN: 10 mg/dL (ref 6–20)
CALCIUM: 8.3 mg/dL — AB (ref 8.9–10.3)
CO2: 26 mmol/L (ref 22–32)
Chloride: 108 mmol/L (ref 101–111)
Creatinine, Ser: 0.86 mg/dL (ref 0.44–1.00)
GFR calc Af Amer: >60 mL/min (ref >60)
GFR calc non Af Amer: >60 mL/min (ref >60)
GLUCOSE: 86 mg/dL (ref 65–99)
Potassium: 3.7 mmol/L (ref 3.5–5.1)
Sodium: 141 mmol/L (ref 135–145)

## 2017-05-29 LAB — CBC
HEMATOCRIT: 32.2 % — AB (ref 36.0–46.0)
Hemoglobin: 10.2 g/dL — ABNORMAL LOW (ref 12.0–15.0)
MCH: 27.1 pg (ref 26.0–34.0)
MCHC: 31.7 g/dL (ref 30.0–36.0)
MCV: 85.4 fL (ref 78.0–100.0)
PLATELETS: 288 10*3/uL (ref 150–400)
RBC: 3.77 MIL/uL — ABNORMAL LOW (ref 3.87–5.11)
RDW: 14.3 % (ref 11.5–15.5)
WBC: 8.4 10*3/uL (ref 4.0–10.5)

## 2017-05-29 LAB — CLOSTRIDIUM DIFFICILE BY PCR: CDIFFPCR: POSITIVE — AB

## 2017-05-29 LAB — GASTROINTESTINAL PANEL BY PCR, STOOL (REPLACES STOOL CULTURE)

## 2017-05-29 LAB — HIV ANTIBODY (ROUTINE TESTING W REFLEX): HIV Screen 4th Generation wRfx: NONREACTIVE

## 2017-05-29 LAB — GLUCOSE, CAPILLARY
GLUCOSE-CAPILLARY: 119 mg/dL — AB (ref 65–99)
GLUCOSE-CAPILLARY: 91 mg/dL (ref 65–99)
Glucose-Capillary: 88 mg/dL (ref 65–99)
Glucose-Capillary: 128 mg/dL — ABNORMAL HIGH (ref 65–99)

## 2017-05-29 MED ORDER — ADULT MULTIVITAMIN LIQUID CH
15.0000 mL | Freq: Every day | ORAL | Status: DC
  Administered 2017-05-29: 10:00:00 15 mL via ORAL
  Filled 2017-05-29 (×2): qty 15

## 2017-05-29 NOTE — Progress Notes (Signed)
Patient ID: [name] [name], female   DOB: [birth year], 46 y.o.   MRN: [id number]     Subjective: Feels better this morning. Less pain. Denies nausea. Still having frequent diarrhea.  Objective: Vital signs in last 24 hours: Temp:  [97.9 F (36.6 C)-98.9 F (37.2 C)] 98.9 F (37.2 C) (06/03 0455) Pulse Rate:  [56-121] 80 (06/03 0455) Resp:  [15-26] 20 (06/03 0455) BP: (109-162)/(50-101) 117/64 (06/03 0455) SpO2:  [94 %-99 %] 97 % (06/03 0455) Weight:  [92.7 kg (204 lb 5.9 oz)-93 kg (205 lb)] 92.7 kg (204 lb 5.9 oz) (06/02 2223) Last BM Date: 05/28/17  Intake/Output from previous day: 06/02 0701 - 06/03 0700 In: 3303.3 [P.O.:420; I.V.:883.3; IV Piggyback:2000] Out: -  Intake/Output this shift: No intake/output data recorded.  General appearance: alert, cooperative and no distress GI: Mild to moderate right-sided abdominal tenderness somewhat improved from yesterday Incision/Wound: No erythema or drainage  Lab Results:   Recent Labs  05/28/17 1011 05/29/17 0448  WBC 10.6* 8.4  HGB 11.3* 10.2*  HCT 35.5* 32.2*  PLT 321 288   BMET  Recent Labs  05/28/17 1011 05/29/17 0448  NA 141 141  K 3.5 3.7  CL 105 108  CO2 29 26  GLUCOSE 105* 86  BUN 13 10  CREATININE 1.10* 0.86  CALCIUM 9.1 8.3*   C. difficile antigen positive, toxin negative.  Studies/Results: Ct Abdomen Pelvis W Contrast  Result Date: 05/28/2017 CLINICAL DATA:  Right lower quadrant abdominal pain and diarrhea after sleeve gastrectomy last week. EXAM: CT ABDOMEN AND PELVIS WITH CONTRAST TECHNIQUE: Multidetector CT imaging of the abdomen and pelvis was performed using the standard protocol following bolus administration of intravenous contrast. CONTRAST:  51mL ISOVUE-300 IOPAMIDOL (ISOVUE-300) INJECTION 61%, <See Chart> ISOVUE-300 IOPAMIDOL (ISOVUE-300) INJECTION 61% COMPARISON:  11/02/2016 FINDINGS: Lower chest: Tiny bilateral pleural effusions noted with trace dependent atelectasis in the lower lobes  bilaterally. Hepatobiliary: No focal abnormality within the liver parenchyma. There is no evidence for gallstones, gallbladder wall thickening, or pericholecystic fluid. No intrahepatic or extrahepatic biliary dilation. Pancreas: No focal mass lesion. No dilatation of the main duct. No intraparenchymal cyst. No peripancreatic edema. Spleen: No splenomegaly. No focal mass lesion. Adrenals/Urinary Tract: No adrenal nodule or mass. Stable appearance small cyst right kidney. Left kidney appears atrophied with areas of focal cortical scarring. 7 mm nonobstructing stone identified lower pole left kidney. Mild fullness left ureter is stable without ureteral stone. No bladder stone. Stomach/Bowel: Surgical suture line along the stomach is compatible with the reported history of sleeve gastrectomy on 05/24/2017 per notes in EP[facility]. Oral contrast material migrates through the stomach into small bowel. No evidence for contrast extravasation from the gastric suture line. Trace amount of fluid is identified along the posterior gastric suture line, not unexpected 4 days from surgery. No oral contrast material identified in the peritoneal cavity. Duodenum is normally positioned as is the ligament of Treitz. No small bowel wall thickening. No small bowel dilatation. The terminal ileum is normal. The appendix is normal. No gross colonic mass. No colonic wall thickening. No substantial diverticular change. Vascular/Lymphatic: No abdominal aortic aneurysm. No abdominal aortic atherosclerotic calcification. There is no gastrohepatic or hepatoduodenal ligament lymphadenopathy. No intraperitoneal or retroperitoneal lymphadenopathy. No pelvic sidewall lymphadenopathy. Reproductive: The uterus has normal CT imaging appearance. There is no adnexal mass. Other: No free fluid in the abdomen or pelvis. There is free fluid in the anterior peritoneal cavity the abdomen with some edema in the omentum and gastrocolic ligament. Musculoskeletal: A  disc or lens shaped collection of fluid and gas is identified in the subcutaneous fat of the supraumbilical anterior abdominal wall. This thin collection measures 14 mm in craniocaudal extent by 13 mm and coronal extent but is only about 12 mm in AP thickness. Bone windows reveal no worrisome lytic or sclerotic osseous lesions. IMPRESSION: 1. Patient is postop day 4 from sleeve gastrectomy. There is free gas identified in the anterior abdomen, but this is not unexpected 4 days from surgery. There is no free fluid in the peritoneal cavity and there is no evidence for enteric contrast extravasation from the gastric suture line to suggest the presence of a leak. No apparent obstruction of contrast flow from the stomach into the small bowel as the small bowel and colon are well opacified and there is no residual contrast material in the gastric remnant. 2. A thin disc-shaped collection of fluid and gas is identified in the subcutaneous fat of the supraumbilical anterior abdominal wall, also most likely postsurgical and may be a seroma or hematoma. 3. Atrophy of the left kidney with mild left hydronephrosis/trace hydroureter, similar to prior studies. 4. Nonobstructing left renal stone. 5. Tiny bilateral pleural effusions was some compressive atelectasis in the dependent lower lobes bilaterally. Electronically Signed   By: Misty Stanley M.D.   On: 05/28/2017 15:39    Anti-infectives: Anti-infectives    Start     Dose/Rate Route Frequency Ordered Stop   05/28/17 2345  vancomycin (VANCOCIN) 50 mg/mL oral solution 125 mg     125 mg Oral 4 times daily 05/28/17 2331 06/11/17 2159      Assessment/Plan: Severe diarrhea, abdominal pain and probable mild dehydration post sleeve gastrectomy. C. difficile antigen positive and clinically consistent with C. difficile colitis. Patient on oral vancomycin. Overall improved today. Bariatric full liquid diet ordered. Ambulation encouraged. Continue hydration and symptomatic  management today.     LOS: 1 day    Tierney Behl T 05/29/2017

## 2017-05-30 LAB — GLUCOSE, CAPILLARY: Glucose-Capillary: 95 mg/dL (ref 65–99)

## 2017-05-30 MED ORDER — VANCOMYCIN 50 MG/ML ORAL SOLUTION: 125.0000 mg | Freq: Four times a day (QID) | ORAL | 0 refills | Status: DC

## 2017-05-30 MED FILL — VANCOMYCIN 50MG/ML-ORAL SOL: 12 days supply | Qty: 120 | Fill #0

## 2017-05-30 NOTE — Progress Notes (Signed)
Pt tolerating diet.  No complaints of nausea this morning and is ready to go home.  D/C instructions and prescription were given. Understanding was verbalized.

## 2017-05-30 NOTE — Anesthesia Postprocedure Evaluation (Signed)
Anesthesia Post Note  Patient: [name] [name]  Procedure(s) Performed: Procedure(s) (LRB): LAPAROSCOPIC GASTRIC SLEEVE RESECTION WITH UPPER ENDO (N/A) UPPER GI ENDOSCOPY LAPAROSCOPIC LYSIS OF ADHESIONS     Anesthesia Post Evaluation  Last Vitals:  Vitals:   05/25/17 0203 05/25/17 0510  BP: 112/60 133/71  Pulse: 65 (!) 58  Resp: 16 16  Temp: 36.9 C 36.9 C    Last Pain:  Vitals:   05/25/17 0918  TempSrc:   PainSc: 4                  Merland Holness S

## 2017-05-30 NOTE — Discharge Summary (Signed)
Physician Discharge Summary  Patient ID: ADALYNA GODBEE MRN: [id number] DOB/AGE: [birth year] 46 y.o.  Admit date: 05/28/2017 Discharge date: 05/30/2017  Admission Diagnoses:  Discharge Diagnoses:  Principal Problem:   Clostridium difficile colitis Active Problems:   Abdominal pain   Discharged Condition: good  Hospital Course: 46 yo female was readmitted within a week after sleeve gastrectomy with abdominal pain and diarrhea. She was diagnosed with c diff colitis and started on oral vancomycin. After 2 day her diarrhea had greatly improved and her pain resolved and she was discharged home.  Consults: None  Significant Diagnostic Studies: c diff +  Treatments: antibiotics  Discharge Exam: Blood pressure (!) 148/90, pulse 72, temperature 98.7 F (37.1 C), temperature source Oral, resp. rate 18, height 5\' 3"  (1.6 m), weight 92.7 kg (204 lb 5.9 oz), last menstrual period 05/08/2017, SpO2 98 %. General appearance: alert and cooperative Head: Normocephalic, without obvious abnormality, atraumatic Resp: clear to auscultation bilaterally Cardio: regular rate and rhythm, S1, S2 normal, no murmur, click, rub or gallop GI: soft, non-tender; bowel sounds normal; no masses,  no organomegaly  Disposition: 01-Home or Self Care  Discharge Instructions    Ambulate hourly while awake    Complete by:  As directed    Call MD for:  difficulty breathing, headache or visual disturbances    Complete by:  As directed    Call MD for:  persistant dizziness or light-headedness    Complete by:  As directed    Call MD for:  persistant nausea and vomiting    Complete by:  As directed    Call MD for:  redness, tenderness, or signs of infection (pain, swelling, redness, odor or green/yellow discharge around incision site)    Complete by:  As directed    Call MD for:  severe uncontrolled pain    Complete by:  As directed    Call MD for:  temperature >101 F    Complete by:  As directed    Diet  bariatric full liquid    Complete by:  As directed    Discharge wound care:    Complete by:  As directed    Remove Bandaids tomorrow, ok to shower tomorrow. Steristrips may fall off in 1-3 weeks.   Incentive spirometry    Complete by:  As directed    Perform hourly while awake     Allergies as of 05/30/2017      Reactions   Augmentin [amoxicillin-pot Clavulanate] Itching, Swelling, Rash, Other (See Comments)   Has patient had a PCN reaction causing immediate rash, facial/tongue/throat swelling, SOB or lightheadedness with hypotension: Yes Has patient had a PCN reaction causing severe rash involving mucus membranes or skin necrosis: No Has patient had a PCN reaction that required hospitalization No Has patient had a PCN reaction occurring within the last 10 years: Yes If all of the above answers are "NO", then may proceed with Cephalosporin use.   Enoxaparin Hives   Avelox [moxifloxacin Hcl In Nacl] Itching, Swelling, Rash, Other (See Comments)   Penicillins Itching, Swelling, Rash   Has patient had a PCN reaction causing immediate rash, facial/tongue/throat swelling, SOB or lightheadedness with hypotension: Yes Has patient had a PCN reaction causing severe rash involving mucus membranes or skin necrosis: No Has patient had a PCN reaction that required hospitalization: Already in hospital when reaction happened Has patient had a PCN reaction occurring within the last 10 years: Unknown If all of the above answers are "NO", then may proceed with Cephalosporin  use.   Sodium Hydroxide Rash      Medication List    TAKE these medications   apixaban 2.5 MG Tabs tablet Commonly known as:  ELIQUIS Take 1 tablet (2.5 mg total) by mouth 2 (two) times daily.   busPIRone 15 MG tablet Commonly known as:  BUSPAR Take 15 mg by mouth 3 (three) times daily.   CALCIUM PO Take 1 tablet by mouth daily.   metFORMIN 500 MG tablet Commonly known as:  GLUCOPHAGE Take 1 tablet (500 mg total) by mouth  2 (two) times daily with a meal.   metoprolol tartrate 25 MG tablet Commonly known as:  LOPRESSOR Take 25 mg by mouth 2 (two) times daily.   multivitamin with minerals Tabs tablet Take 1 tablet by mouth daily.   omeprazole 20 MG capsule Commonly known as:  PRILOSEC Take 40 mg by mouth daily with breakfast.   ondansetron 8 MG tablet Commonly known as:  ZOFRAN Take 8 mg by mouth every 8 (eight) hours as needed for nausea or vomiting.   oxyCODONE 5 MG/5ML solution Commonly known as:  ROXICODONE Take 5 mg by mouth every 4 (four) hours as needed for pain.   predniSONE 20 MG tablet Commonly known as:  DELTASONE Take 2 tablets (40 mg total) by mouth daily with breakfast.   traMADol 50 MG tablet Commonly known as:  ULTRAM Take 50 mg by mouth at bedtime as needed for moderate pain or severe pain.   traZODone 100 MG tablet Commonly known as:  DESYREL Take 50-200 mg by mouth See admin instructions. Take 50mg  in the afternoon and then take 200mg  at bedtime   vancomycin 50 mg/mL oral solution Commonly known as:  VANCOCIN Take 2.5 mLs (125 mg total) by mouth 4 (four) times daily.      Follow-up Information    Akshar Starnes, Arta Bruce, MD.   Specialty:  General Surgery Why:  As scheduled Contact information: [address] [place] [address] [phone]        [facility] DEPT.   Specialty:  Emergency Medicine Why:  If symptoms worsen Contact information: [place] [id number] [place] [phone]          Signed: Arta Bruce Kazim Corrales 05/30/2017, 7:53 AM

## 2017-05-30 NOTE — Addendum Note (Signed)
Addendum  created 05/30/17 1458 by Myrtie Soman, MD   Sign clinical note

## 2017-05-31 LAB — URINE CULTURE: SPECIAL REQUESTS: NORMAL

## 2017-06-01 DIAGNOSIS — F250 Schizoaffective disorder, bipolar type: Secondary | ICD-10-CM | POA: Diagnosis not present

## 2017-06-01 DIAGNOSIS — Z9889 Other specified postprocedural states: Secondary | ICD-10-CM | POA: Diagnosis not present

## 2017-06-01 DIAGNOSIS — I10 Essential (primary) hypertension: Secondary | ICD-10-CM | POA: Diagnosis not present

## 2017-06-01 DIAGNOSIS — F333 Major depressive disorder, recurrent, severe with psychotic symptoms: Secondary | ICD-10-CM | POA: Diagnosis not present

## 2017-06-01 DIAGNOSIS — D509 Iron deficiency anemia, unspecified: Secondary | ICD-10-CM | POA: Diagnosis not present

## 2017-06-01 DIAGNOSIS — T391X2A Poisoning by 4-Aminophenol derivatives, intentional self-harm, initial encounter: Secondary | ICD-10-CM | POA: Diagnosis not present

## 2017-06-01 DIAGNOSIS — F603 Borderline personality disorder: Secondary | ICD-10-CM | POA: Diagnosis not present

## 2017-06-01 DIAGNOSIS — T391X4A Poisoning by 4-Aminophenol derivatives, undetermined, initial encounter: Secondary | ICD-10-CM | POA: Diagnosis not present

## 2017-06-01 DIAGNOSIS — A0472 Enterocolitis due to Clostridium difficile, not specified as recurrent: Secondary | ICD-10-CM | POA: Diagnosis not present

## 2017-06-01 DIAGNOSIS — F209 Schizophrenia, unspecified: Secondary | ICD-10-CM | POA: Diagnosis not present

## 2017-06-01 DIAGNOSIS — T391X1A Poisoning by 4-Aminophenol derivatives, accidental (unintentional), initial encounter: Secondary | ICD-10-CM | POA: Insufficient documentation

## 2017-06-01 DIAGNOSIS — E119 Type 2 diabetes mellitus without complications: Secondary | ICD-10-CM | POA: Diagnosis not present

## 2017-06-01 DIAGNOSIS — F22 Delusional disorders: Secondary | ICD-10-CM | POA: Diagnosis not present

## 2017-06-01 DIAGNOSIS — J40 Bronchitis, not specified as acute or chronic: Secondary | ICD-10-CM | POA: Diagnosis not present

## 2017-06-01 DIAGNOSIS — R45851 Suicidal ideations: Secondary | ICD-10-CM | POA: Diagnosis not present

## 2017-06-02 ENCOUNTER — Telehealth (HOSPITAL_COMMUNITY): Payer: Self-pay

## 2017-06-02 DIAGNOSIS — A0472 Enterocolitis due to Clostridium difficile, not specified as recurrent: Secondary | ICD-10-CM | POA: Diagnosis not present

## 2017-06-02 DIAGNOSIS — F250 Schizoaffective disorder, bipolar type: Secondary | ICD-10-CM | POA: Diagnosis not present

## 2017-06-02 DIAGNOSIS — T391X1A Poisoning by 4-Aminophenol derivatives, accidental (unintentional), initial encounter: Secondary | ICD-10-CM | POA: Diagnosis not present

## 2017-06-02 DIAGNOSIS — Z9889 Other specified postprocedural states: Secondary | ICD-10-CM | POA: Diagnosis not present

## 2017-06-02 DIAGNOSIS — D509 Iron deficiency anemia, unspecified: Secondary | ICD-10-CM | POA: Diagnosis present

## 2017-06-02 DIAGNOSIS — R45851 Suicidal ideations: Secondary | ICD-10-CM | POA: Diagnosis not present

## 2017-06-02 DIAGNOSIS — T391X2A Poisoning by 4-Aminophenol derivatives, intentional self-harm, initial encounter: Secondary | ICD-10-CM | POA: Diagnosis present

## 2017-06-02 DIAGNOSIS — T50992A Poisoning by other drugs, medicaments and biological substances, intentional self-harm, initial encounter: Secondary | ICD-10-CM | POA: Diagnosis not present

## 2017-06-02 DIAGNOSIS — J40 Bronchitis, not specified as acute or chronic: Secondary | ICD-10-CM | POA: Diagnosis not present

## 2017-06-02 DIAGNOSIS — Z9884 Bariatric surgery status: Secondary | ICD-10-CM | POA: Diagnosis not present

## 2017-06-02 DIAGNOSIS — I10 Essential (primary) hypertension: Secondary | ICD-10-CM | POA: Diagnosis not present

## 2017-06-02 DIAGNOSIS — F603 Borderline personality disorder: Secondary | ICD-10-CM | POA: Diagnosis not present

## 2017-06-02 DIAGNOSIS — E119 Type 2 diabetes mellitus without complications: Secondary | ICD-10-CM | POA: Diagnosis present

## 2017-06-02 DIAGNOSIS — T50902A Poisoning by unspecified drugs, medicaments and biological substances, intentional self-harm, initial encounter: Secondary | ICD-10-CM | POA: Diagnosis not present

## 2017-06-02 DIAGNOSIS — Z7984 Long term (current) use of oral hypoglycemic drugs: Secondary | ICD-10-CM | POA: Diagnosis not present

## 2017-06-02 DIAGNOSIS — Z880 Allergy status to penicillin: Secondary | ICD-10-CM | POA: Diagnosis not present

## 2017-06-02 DIAGNOSIS — F419 Anxiety disorder, unspecified: Secondary | ICD-10-CM | POA: Diagnosis present

## 2017-06-02 DIAGNOSIS — F4310 Post-traumatic stress disorder, unspecified: Secondary | ICD-10-CM | POA: Diagnosis present

## 2017-06-02 DIAGNOSIS — F333 Major depressive disorder, recurrent, severe with psychotic symptoms: Secondary | ICD-10-CM | POA: Diagnosis not present

## 2017-06-02 DIAGNOSIS — F251 Schizoaffective disorder, depressive type: Secondary | ICD-10-CM | POA: Diagnosis not present

## 2017-06-02 DIAGNOSIS — Z62810 Personal history of physical and sexual abuse in childhood: Secondary | ICD-10-CM | POA: Diagnosis present

## 2017-06-02 LAB — CULTURE, BLOOD (ROUTINE X 2)
CULTURE: NO GROWTH
Culture: NO GROWTH
SPECIAL REQUESTS: ADEQUATE
Special Requests: ADEQUATE

## 2017-06-02 NOTE — Telephone Encounter (Addendum)
Voice message left for patient with contact information for follow up post bariatric surgery.  Await return call.     06/06/17   Made discharge phone call to patientl. Asking the following questions.    1. Do you have someone to care for you now that you are home?  None needed 2. Are you having pain now that is not relieved by your pain medication?  yes 3. Are you able to drink the recommended daily amount of fluids (48 ounces minimum/day) and protein (60-80 grams/day) as prescribed by the dietitian or nutritional counselor?   4. Are you taking the vitamins and minerals as prescribed?  No problems with either 5. Do you have the "on call" number to contact your surgeon if you have a problem or question?yes   6. Are your incisions free of redness, swelling or drainage? (If steri strips, address that these can fall off, shower as tolerated) no problems with incision 7. Have your bowels moved since your surgery?  If not, are you passing gas?  yes 8. Are you up and walking 3-4 times per day?  yes 9. Were you provided your discharge medications before your surgery or before you were discharged from the hospital and are you taking them without problem?  yes

## 2017-06-05 ENCOUNTER — Telehealth: Payer: Self-pay | Admitting: General Surgery

## 2017-06-05 NOTE — Telephone Encounter (Signed)
She is s/p laparoscopic sleeve gastrectomy 05/24/17 by Dr. Kieth Brightly.  She turned over in bed last night and developed pain in her right side at the location of one of her incisions.  She took some pain medication would helped.  She is having pain again particular when she bends over.  No fever.  Had some n/v at lunch but since then has been able to keep liquids down.  I advised her to avoid bending and twisting as much as possible.  I advised her to use her pain medication as needed.  If was not getting better by tomorrow, I advised her to call the office.

## 2017-06-06 ENCOUNTER — Institutional Professional Consult (permissible substitution): Payer: Self-pay | Admitting: Pulmonary Disease

## 2017-06-06 DIAGNOSIS — E119 Type 2 diabetes mellitus without complications: Secondary | ICD-10-CM | POA: Diagnosis not present

## 2017-06-06 DIAGNOSIS — R399 Unspecified symptoms and signs involving the genitourinary system: Secondary | ICD-10-CM | POA: Diagnosis not present

## 2017-06-06 DIAGNOSIS — N898 Other specified noninflammatory disorders of vagina: Secondary | ICD-10-CM | POA: Diagnosis not present

## 2017-06-06 DIAGNOSIS — Z7984 Long term (current) use of oral hypoglycemic drugs: Secondary | ICD-10-CM | POA: Diagnosis not present

## 2017-06-07 ENCOUNTER — Encounter: Payer: Medicare Other | Attending: General Surgery | Admitting: Skilled Nursing Facility1

## 2017-06-07 ENCOUNTER — Encounter: Payer: Self-pay | Admitting: Skilled Nursing Facility1

## 2017-06-07 DIAGNOSIS — Z6839 Body mass index (BMI) 39.0-39.9, adult: Secondary | ICD-10-CM | POA: Diagnosis not present

## 2017-06-07 DIAGNOSIS — Z713 Dietary counseling and surveillance: Secondary | ICD-10-CM | POA: Diagnosis not present

## 2017-06-07 DIAGNOSIS — Z79899 Other long term (current) drug therapy: Secondary | ICD-10-CM | POA: Diagnosis not present

## 2017-06-07 DIAGNOSIS — E119 Type 2 diabetes mellitus without complications: Secondary | ICD-10-CM

## 2017-06-07 DIAGNOSIS — Z888 Allergy status to other drugs, medicaments and biological substances status: Secondary | ICD-10-CM | POA: Insufficient documentation

## 2017-06-07 NOTE — Progress Notes (Signed)
Bariatric Class:  Appt start time: 1530 end time:  1630.  2 Week Post-Operative Nutrition Class  Patient was seen on 06/07/2017 for Post-Operative Nutrition education at [facility].  Checking blood sugar once a day: 112 and A1C 5.9 Surgery date: 05/24/2017 Surgery type: Sleeve gastrectomy Start weight at [facility]: 222.1 Weight today: 196.6 Weight change: 15  TANITA  BODY COMP RESULTS  06/07/2017   BMI (kg/m^2) 34.8   Fat Mass (lbs) 89.8   Fat Free Mass (lbs) 106.8   Total Body Water (lbs) 76.6   The following the learning objectives were met by the patient during this course:  Identifies Phase 3A (Soft, High Proteins) Dietary Goals and will begin from 2 weeks post-operatively to 2 months post-operatively  Identifies appropriate sources of fluids and proteins   States protein recommendations and appropriate sources post-operatively  Identifies the need for appropriate texture modifications, mastication, and bite sizes when consuming solids  Identifies appropriate multivitamin and calcium sources post-operatively  Describes the need for physical activity post-operatively and will follow MD recommendations  States when to call healthcare provider regarding medication questions or post-operative complications  Handouts given during class include:  Phase 3A: Soft, High Protein Diet Handout  Follow-Up Plan: Patient will follow-up at [facility] in 6 weeks for 2 month post-op nutrition visit for diet advancement per MD.

## 2017-06-09 DIAGNOSIS — I4589 Other specified conduction disorders: Secondary | ICD-10-CM | POA: Diagnosis not present

## 2017-06-09 DIAGNOSIS — R441 Visual hallucinations: Secondary | ICD-10-CM | POA: Diagnosis not present

## 2017-06-09 DIAGNOSIS — R440 Auditory hallucinations: Secondary | ICD-10-CM | POA: Diagnosis not present

## 2017-06-09 DIAGNOSIS — E221 Hyperprolactinemia: Secondary | ICD-10-CM | POA: Diagnosis not present

## 2017-06-09 DIAGNOSIS — A0472 Enterocolitis due to Clostridium difficile, not specified as recurrent: Secondary | ICD-10-CM | POA: Diagnosis not present

## 2017-06-09 DIAGNOSIS — R45851 Suicidal ideations: Secondary | ICD-10-CM | POA: Diagnosis not present

## 2017-06-09 DIAGNOSIS — D509 Iron deficiency anemia, unspecified: Secondary | ICD-10-CM | POA: Diagnosis not present

## 2017-06-09 DIAGNOSIS — F259 Schizoaffective disorder, unspecified: Secondary | ICD-10-CM | POA: Diagnosis not present

## 2017-06-09 DIAGNOSIS — F319 Bipolar disorder, unspecified: Secondary | ICD-10-CM | POA: Diagnosis not present

## 2017-06-09 DIAGNOSIS — E119 Type 2 diabetes mellitus without complications: Secondary | ICD-10-CM | POA: Diagnosis not present

## 2017-06-09 DIAGNOSIS — F29 Unspecified psychosis not due to a substance or known physiological condition: Secondary | ICD-10-CM | POA: Diagnosis not present

## 2017-06-09 DIAGNOSIS — R9431 Abnormal electrocardiogram [ECG] [EKG]: Secondary | ICD-10-CM | POA: Diagnosis not present

## 2017-06-09 DIAGNOSIS — F603 Borderline personality disorder: Secondary | ICD-10-CM | POA: Diagnosis not present

## 2017-06-09 DIAGNOSIS — T1491XA Suicide attempt, initial encounter: Secondary | ICD-10-CM | POA: Diagnosis not present

## 2017-06-10 DIAGNOSIS — F419 Anxiety disorder, unspecified: Secondary | ICD-10-CM | POA: Diagnosis present

## 2017-06-10 DIAGNOSIS — F29 Unspecified psychosis not due to a substance or known physiological condition: Secondary | ICD-10-CM | POA: Diagnosis not present

## 2017-06-10 DIAGNOSIS — Z8249 Family history of ischemic heart disease and other diseases of the circulatory system: Secondary | ICD-10-CM | POA: Diagnosis not present

## 2017-06-10 DIAGNOSIS — I10 Essential (primary) hypertension: Secondary | ICD-10-CM | POA: Diagnosis not present

## 2017-06-10 DIAGNOSIS — T1491XD Suicide attempt, subsequent encounter: Secondary | ICD-10-CM | POA: Diagnosis not present

## 2017-06-10 DIAGNOSIS — D509 Iron deficiency anemia, unspecified: Secondary | ICD-10-CM | POA: Diagnosis present

## 2017-06-10 DIAGNOSIS — F259 Schizoaffective disorder, unspecified: Secondary | ICD-10-CM | POA: Diagnosis not present

## 2017-06-10 DIAGNOSIS — F603 Borderline personality disorder: Secondary | ICD-10-CM | POA: Diagnosis not present

## 2017-06-10 DIAGNOSIS — Z7901 Long term (current) use of anticoagulants: Secondary | ICD-10-CM | POA: Diagnosis not present

## 2017-06-10 DIAGNOSIS — E119 Type 2 diabetes mellitus without complications: Secondary | ICD-10-CM | POA: Diagnosis present

## 2017-06-10 DIAGNOSIS — F319 Bipolar disorder, unspecified: Secondary | ICD-10-CM | POA: Diagnosis present

## 2017-06-10 DIAGNOSIS — K219 Gastro-esophageal reflux disease without esophagitis: Secondary | ICD-10-CM | POA: Diagnosis present

## 2017-06-10 DIAGNOSIS — Z880 Allergy status to penicillin: Secondary | ICD-10-CM | POA: Diagnosis not present

## 2017-06-10 DIAGNOSIS — T1491XA Suicide attempt, initial encounter: Secondary | ICD-10-CM | POA: Diagnosis not present

## 2017-06-10 DIAGNOSIS — F251 Schizoaffective disorder, depressive type: Secondary | ICD-10-CM | POA: Diagnosis not present

## 2017-06-10 DIAGNOSIS — A0472 Enterocolitis due to Clostridium difficile, not specified as recurrent: Secondary | ICD-10-CM | POA: Diagnosis present

## 2017-06-10 DIAGNOSIS — E221 Hyperprolactinemia: Secondary | ICD-10-CM | POA: Diagnosis present

## 2017-06-10 DIAGNOSIS — R440 Auditory hallucinations: Secondary | ICD-10-CM | POA: Diagnosis not present

## 2017-06-16 DIAGNOSIS — F29 Unspecified psychosis not due to a substance or known physiological condition: Secondary | ICD-10-CM | POA: Diagnosis not present

## 2017-06-17 DIAGNOSIS — F251 Schizoaffective disorder, depressive type: Secondary | ICD-10-CM | POA: Diagnosis not present

## 2017-06-18 DIAGNOSIS — R197 Diarrhea, unspecified: Secondary | ICD-10-CM | POA: Diagnosis not present

## 2017-06-18 DIAGNOSIS — N200 Calculus of kidney: Secondary | ICD-10-CM | POA: Diagnosis not present

## 2017-06-18 DIAGNOSIS — R112 Nausea with vomiting, unspecified: Secondary | ICD-10-CM | POA: Diagnosis not present

## 2017-06-18 DIAGNOSIS — R319 Hematuria, unspecified: Secondary | ICD-10-CM | POA: Diagnosis not present
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
## 2017-06-19 DIAGNOSIS — N200 Calculus of kidney: Secondary | ICD-10-CM | POA: Diagnosis not present

## 2017-06-21 DIAGNOSIS — N200 Calculus of kidney: Secondary | ICD-10-CM | POA: Diagnosis not present

## 2017-06-21 DIAGNOSIS — R109 Unspecified abdominal pain: Secondary | ICD-10-CM | POA: Diagnosis not present

## 2017-06-23 DIAGNOSIS — F29 Unspecified psychosis not due to a substance or known physiological condition: Secondary | ICD-10-CM | POA: Diagnosis not present

## 2017-06-24 DIAGNOSIS — F603 Borderline personality disorder: Secondary | ICD-10-CM | POA: Diagnosis not present

## 2017-06-27 DIAGNOSIS — F29 Unspecified psychosis not due to a substance or known physiological condition: Secondary | ICD-10-CM | POA: Diagnosis not present

## 2017-06-28 ENCOUNTER — Telehealth: Payer: Self-pay | Admitting: Acute Care

## 2017-06-28 DIAGNOSIS — R1031 Right lower quadrant pain: Secondary | ICD-10-CM | POA: Diagnosis not present

## 2017-06-28 DIAGNOSIS — R109 Unspecified abdominal pain: Secondary | ICD-10-CM | POA: Diagnosis not present

## 2017-06-28 DIAGNOSIS — N281 Cyst of kidney, acquired: Secondary | ICD-10-CM | POA: Diagnosis not present

## 2017-06-28 DIAGNOSIS — J90 Pleural effusion, not elsewhere classified: Secondary | ICD-10-CM

## 2017-06-28 DIAGNOSIS — N200 Calculus of kidney: Secondary | ICD-10-CM | POA: Diagnosis not present

## 2017-06-28 NOTE — Telephone Encounter (Signed)
Pt was contacted and voicemail was left informing patient of xray needing to be performed (per [facility] 05/19/17 note)  prior to appointment on 06/30/17 at 0900. Pt was ask to be here at 0835 to check in and be sent down to xray. Order placed for xray. Nothing further is needed.

## 2017-06-29 DIAGNOSIS — R109 Unspecified abdominal pain: Secondary | ICD-10-CM | POA: Diagnosis not present

## 2017-06-29 DIAGNOSIS — N281 Cyst of kidney, acquired: Secondary | ICD-10-CM | POA: Diagnosis not present

## 2017-06-30 ENCOUNTER — Encounter: Payer: Self-pay | Admitting: Acute Care

## 2017-06-30 ENCOUNTER — Ambulatory Visit (INDEPENDENT_AMBULATORY_CARE_PROVIDER_SITE_OTHER)
Admission: RE | Admit: 2017-06-30 | Discharge: 2017-06-30 | Disposition: A | Discharge status: Home / Self Care | Payer: Medicare Other | Source: Ambulatory Visit | Attending: Acute Care | Admitting: Acute Care

## 2017-06-30 ENCOUNTER — Ambulatory Visit (INDEPENDENT_AMBULATORY_CARE_PROVIDER_SITE_OTHER): Payer: Medicare Other | Admitting: Acute Care

## 2017-06-30 DIAGNOSIS — J90 Pleural effusion, not elsewhere classified: Secondary | ICD-10-CM

## 2017-06-30 DIAGNOSIS — R079 Chest pain, unspecified: Secondary | ICD-10-CM | POA: Diagnosis not present
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
## 2017-06-30 NOTE — Patient Instructions (Signed)
It is great to see you today. CXR today shows complete resolution of the small effusion you had. Follow up with Dr. Halford Chessman or NP as needed in the future. Please contact office for sooner follow up if symptoms do not improve or worsen or seek emergency care

## 2017-06-30 NOTE — Assessment & Plan Note (Signed)
Complete resolution of small right pleural effusion per CXR 06/30/2017. Complete resolution of any clinical S/S  Plan: CXR today shows complete resolution of the small effusion you had. Follow up with Dr. Halford Chessman or NP as needed in the future. Please contact office for sooner follow up if symptoms do not improve or worsen or seek emergency care  .

## 2017-07-01 DIAGNOSIS — F603 Borderline personality disorder: Secondary | ICD-10-CM | POA: Diagnosis not present

## 2017-07-01 NOTE — Progress Notes (Signed)
I have reviewed and agree with assessment/plan.  Chesley Mires, MD [facility] Pulmonary/Critical Care 07/01/2017, 7:39 AM Pager:  [phone]

## 2017-07-04 DIAGNOSIS — F251 Schizoaffective disorder, depressive type: Secondary | ICD-10-CM | POA: Diagnosis not present

## 2017-07-05 DIAGNOSIS — E669 Obesity, unspecified: Secondary | ICD-10-CM | POA: Diagnosis not present

## 2017-07-05 DIAGNOSIS — F323 Major depressive disorder, single episode, severe with psychotic features: Secondary | ICD-10-CM | POA: Diagnosis not present

## 2017-07-05 DIAGNOSIS — G4730 Sleep apnea, unspecified: Secondary | ICD-10-CM | POA: Diagnosis present

## 2017-07-05 DIAGNOSIS — Z87442 Personal history of urinary calculi: Secondary | ICD-10-CM | POA: Diagnosis not present

## 2017-07-05 DIAGNOSIS — F333 Major depressive disorder, recurrent, severe with psychotic symptoms: Secondary | ICD-10-CM | POA: Diagnosis not present

## 2017-07-05 DIAGNOSIS — Z79899 Other long term (current) drug therapy: Secondary | ICD-10-CM | POA: Diagnosis not present

## 2017-07-05 DIAGNOSIS — G479 Sleep disorder, unspecified: Secondary | ICD-10-CM | POA: Diagnosis present

## 2017-07-05 DIAGNOSIS — G4733 Obstructive sleep apnea (adult) (pediatric): Secondary | ICD-10-CM | POA: Diagnosis not present

## 2017-07-05 DIAGNOSIS — R45851 Suicidal ideations: Secondary | ICD-10-CM | POA: Diagnosis present

## 2017-07-05 DIAGNOSIS — F251 Schizoaffective disorder, depressive type: Secondary | ICD-10-CM | POA: Diagnosis not present

## 2017-07-05 DIAGNOSIS — K219 Gastro-esophageal reflux disease without esophagitis: Secondary | ICD-10-CM | POA: Diagnosis present

## 2017-07-05 DIAGNOSIS — Z880 Allergy status to penicillin: Secondary | ICD-10-CM | POA: Diagnosis not present

## 2017-07-05 DIAGNOSIS — Z888 Allergy status to other drugs, medicaments and biological substances status: Secondary | ICD-10-CM | POA: Diagnosis not present

## 2017-07-05 DIAGNOSIS — Z811 Family history of alcohol abuse and dependence: Secondary | ICD-10-CM | POA: Diagnosis not present

## 2017-07-05 DIAGNOSIS — Z818 Family history of other mental and behavioral disorders: Secondary | ICD-10-CM | POA: Diagnosis not present

## 2017-07-05 DIAGNOSIS — Z883 Allergy status to other anti-infective agents status: Secondary | ICD-10-CM | POA: Diagnosis not present

## 2017-07-05 DIAGNOSIS — Z6830 Body mass index (BMI) 30.0-30.9, adult: Secondary | ICD-10-CM | POA: Diagnosis not present

## 2017-07-05 DIAGNOSIS — F419 Anxiety disorder, unspecified: Secondary | ICD-10-CM | POA: Diagnosis present

## 2017-07-05 DIAGNOSIS — I10 Essential (primary) hypertension: Secondary | ICD-10-CM | POA: Diagnosis not present

## 2017-07-05 DIAGNOSIS — E785 Hyperlipidemia, unspecified: Secondary | ICD-10-CM | POA: Diagnosis present

## 2017-07-05 DIAGNOSIS — Z7984 Long term (current) use of oral hypoglycemic drugs: Secondary | ICD-10-CM | POA: Diagnosis not present

## 2017-07-05 DIAGNOSIS — E119 Type 2 diabetes mellitus without complications: Secondary | ICD-10-CM | POA: Diagnosis present

## 2017-07-05 DIAGNOSIS — E559 Vitamin D deficiency, unspecified: Secondary | ICD-10-CM | POA: Diagnosis present

## 2017-07-05 DIAGNOSIS — Z62810 Personal history of physical and sexual abuse in childhood: Secondary | ICD-10-CM | POA: Diagnosis present

## 2017-07-05 DIAGNOSIS — Z86718 Personal history of other venous thrombosis and embolism: Secondary | ICD-10-CM | POA: Diagnosis not present

## 2017-07-05 DIAGNOSIS — E7800 Pure hypercholesterolemia, unspecified: Secondary | ICD-10-CM | POA: Diagnosis present

## 2017-07-05 DIAGNOSIS — F4481 Dissociative identity disorder: Secondary | ICD-10-CM | POA: Diagnosis not present

## 2017-07-15 DIAGNOSIS — F4481 Dissociative identity disorder: Secondary | ICD-10-CM | POA: Insufficient documentation

## 2017-07-21 ENCOUNTER — Ambulatory Visit: Payer: Self-pay | Admitting: Registered"

## 2017-07-21 ENCOUNTER — Ambulatory Visit: Payer: Self-pay | Admitting: Skilled Nursing Facility1

## 2017-07-27 DIAGNOSIS — F29 Unspecified psychosis not due to a substance or known physiological condition: Secondary | ICD-10-CM | POA: Diagnosis not present

## 2017-07-29 DIAGNOSIS — Z888 Allergy status to other drugs, medicaments and biological substances status: Secondary | ICD-10-CM | POA: Diagnosis not present

## 2017-07-29 DIAGNOSIS — Z903 Acquired absence of stomach [part of]: Secondary | ICD-10-CM | POA: Diagnosis not present

## 2017-07-29 DIAGNOSIS — Z609 Problem related to social environment, unspecified: Secondary | ICD-10-CM | POA: Diagnosis not present

## 2017-07-29 DIAGNOSIS — R9431 Abnormal electrocardiogram [ECG] [EKG]: Secondary | ICD-10-CM | POA: Diagnosis not present

## 2017-07-29 DIAGNOSIS — Z599 Problem related to housing and economic circumstances, unspecified: Secondary | ICD-10-CM | POA: Diagnosis not present

## 2017-07-29 DIAGNOSIS — F603 Borderline personality disorder: Secondary | ICD-10-CM | POA: Diagnosis not present

## 2017-07-29 DIAGNOSIS — E669 Obesity, unspecified: Secondary | ICD-10-CM | POA: Diagnosis not present

## 2017-07-29 DIAGNOSIS — F419 Anxiety disorder, unspecified: Secondary | ICD-10-CM | POA: Diagnosis not present

## 2017-07-29 DIAGNOSIS — Z85828 Personal history of other malignant neoplasm of skin: Secondary | ICD-10-CM | POA: Diagnosis not present

## 2017-07-29 DIAGNOSIS — Z7984 Long term (current) use of oral hypoglycemic drugs: Secondary | ICD-10-CM | POA: Diagnosis not present

## 2017-07-29 DIAGNOSIS — R440 Auditory hallucinations: Secondary | ICD-10-CM | POA: Diagnosis not present

## 2017-07-29 DIAGNOSIS — Z881 Allergy status to other antibiotic agents status: Secondary | ICD-10-CM | POA: Diagnosis not present

## 2017-07-29 DIAGNOSIS — Z880 Allergy status to penicillin: Secondary | ICD-10-CM | POA: Diagnosis not present

## 2017-07-29 DIAGNOSIS — Z79899 Other long term (current) drug therapy: Secondary | ICD-10-CM | POA: Diagnosis not present

## 2017-07-29 DIAGNOSIS — Z6830 Body mass index (BMI) 30.0-30.9, adult: Secondary | ICD-10-CM | POA: Diagnosis not present

## 2017-07-29 DIAGNOSIS — F209 Schizophrenia, unspecified: Secondary | ICD-10-CM | POA: Diagnosis not present

## 2017-07-29 DIAGNOSIS — R450 Nervousness: Secondary | ICD-10-CM | POA: Diagnosis not present

## 2017-07-29 DIAGNOSIS — R45851 Suicidal ideations: Secondary | ICD-10-CM | POA: Diagnosis not present

## 2017-07-29 DIAGNOSIS — Z915 Personal history of self-harm: Secondary | ICD-10-CM | POA: Diagnosis not present

## 2017-07-29 DIAGNOSIS — I10 Essential (primary) hypertension: Secondary | ICD-10-CM | POA: Diagnosis not present

## 2017-07-30 DIAGNOSIS — F333 Major depressive disorder, recurrent, severe with psychotic symptoms: Secondary | ICD-10-CM | POA: Diagnosis not present

## 2017-07-30 DIAGNOSIS — E669 Obesity, unspecified: Secondary | ICD-10-CM | POA: Diagnosis not present

## 2017-07-30 DIAGNOSIS — F209 Schizophrenia, unspecified: Secondary | ICD-10-CM | POA: Diagnosis not present

## 2017-07-30 DIAGNOSIS — R45851 Suicidal ideations: Secondary | ICD-10-CM | POA: Diagnosis present

## 2017-07-30 DIAGNOSIS — F251 Schizoaffective disorder, depressive type: Secondary | ICD-10-CM | POA: Diagnosis present

## 2017-07-30 DIAGNOSIS — E119 Type 2 diabetes mellitus without complications: Secondary | ICD-10-CM | POA: Diagnosis present

## 2017-07-30 DIAGNOSIS — E282 Polycystic ovarian syndrome: Secondary | ICD-10-CM | POA: Diagnosis present

## 2017-07-30 DIAGNOSIS — Z915 Personal history of self-harm: Secondary | ICD-10-CM | POA: Diagnosis not present

## 2017-07-30 DIAGNOSIS — K219 Gastro-esophageal reflux disease without esophagitis: Secondary | ICD-10-CM | POA: Diagnosis present

## 2017-07-30 DIAGNOSIS — F4310 Post-traumatic stress disorder, unspecified: Secondary | ICD-10-CM | POA: Diagnosis present

## 2017-07-30 DIAGNOSIS — Z62810 Personal history of physical and sexual abuse in childhood: Secondary | ICD-10-CM | POA: Diagnosis present

## 2017-07-30 DIAGNOSIS — F419 Anxiety disorder, unspecified: Secondary | ICD-10-CM | POA: Diagnosis not present

## 2017-07-30 DIAGNOSIS — I10 Essential (primary) hypertension: Secondary | ICD-10-CM | POA: Diagnosis present

## 2017-08-03 ENCOUNTER — Ambulatory Visit: Payer: Self-pay | Admitting: Skilled Nursing Facility1

## 2017-08-08 DIAGNOSIS — Z880 Allergy status to penicillin: Secondary | ICD-10-CM | POA: Diagnosis not present

## 2017-08-08 DIAGNOSIS — Z79899 Other long term (current) drug therapy: Secondary | ICD-10-CM | POA: Diagnosis not present

## 2017-08-08 DIAGNOSIS — Z881 Allergy status to other antibiotic agents status: Secondary | ICD-10-CM | POA: Diagnosis not present

## 2017-08-08 DIAGNOSIS — Z888 Allergy status to other drugs, medicaments and biological substances status: Secondary | ICD-10-CM | POA: Diagnosis not present

## 2017-08-08 DIAGNOSIS — K219 Gastro-esophageal reflux disease without esophagitis: Secondary | ICD-10-CM | POA: Diagnosis not present

## 2017-08-08 DIAGNOSIS — F323 Major depressive disorder, single episode, severe with psychotic features: Secondary | ICD-10-CM | POA: Diagnosis not present

## 2017-08-08 DIAGNOSIS — F251 Schizoaffective disorder, depressive type: Secondary | ICD-10-CM | POA: Diagnosis not present

## 2017-08-08 DIAGNOSIS — E785 Hyperlipidemia, unspecified: Secondary | ICD-10-CM | POA: Diagnosis not present

## 2017-08-08 DIAGNOSIS — I10 Essential (primary) hypertension: Secondary | ICD-10-CM | POA: Diagnosis not present

## 2017-08-11 DIAGNOSIS — F29 Unspecified psychosis not due to a substance or known physiological condition: Secondary | ICD-10-CM | POA: Diagnosis not present

## 2017-08-12 DIAGNOSIS — F603 Borderline personality disorder: Secondary | ICD-10-CM | POA: Diagnosis not present

## 2017-08-15 DIAGNOSIS — Z79899 Other long term (current) drug therapy: Secondary | ICD-10-CM | POA: Diagnosis not present

## 2017-08-15 DIAGNOSIS — F603 Borderline personality disorder: Secondary | ICD-10-CM | POA: Diagnosis not present

## 2017-08-16 DIAGNOSIS — F333 Major depressive disorder, recurrent, severe with psychotic symptoms: Secondary | ICD-10-CM | POA: Diagnosis not present

## 2017-08-16 DIAGNOSIS — E119 Type 2 diabetes mellitus without complications: Secondary | ICD-10-CM | POA: Diagnosis not present

## 2017-08-16 DIAGNOSIS — K219 Gastro-esophageal reflux disease without esophagitis: Secondary | ICD-10-CM | POA: Diagnosis not present

## 2017-08-16 DIAGNOSIS — E785 Hyperlipidemia, unspecified: Secondary | ICD-10-CM | POA: Diagnosis not present

## 2017-08-16 DIAGNOSIS — I10 Essential (primary) hypertension: Secondary | ICD-10-CM | POA: Diagnosis not present

## 2017-08-16 DIAGNOSIS — F323 Major depressive disorder, single episode, severe with psychotic features: Secondary | ICD-10-CM | POA: Diagnosis not present

## 2017-08-16 DIAGNOSIS — F251 Schizoaffective disorder, depressive type: Secondary | ICD-10-CM | POA: Diagnosis not present

## 2017-08-18 DIAGNOSIS — Z1231 Encounter for screening mammogram for malignant neoplasm of breast: Secondary | ICD-10-CM | POA: Diagnosis not present

## 2017-08-18 DIAGNOSIS — Z01419 Encounter for gynecological examination (general) (routine) without abnormal findings: Secondary | ICD-10-CM | POA: Diagnosis not present

## 2017-08-18 DIAGNOSIS — Z6832 Body mass index (BMI) 32.0-32.9, adult: Secondary | ICD-10-CM | POA: Diagnosis not present

## 2017-08-18 IMAGING — DX DG CHEST 2V
2 series · 2 of 2 positions shown · non-contrast
Comparison: None.

CLINICAL DATA: LEFT pleural effusions.  Some chest pain

EXAM:
CHEST  2 VIEW

[chest pa]
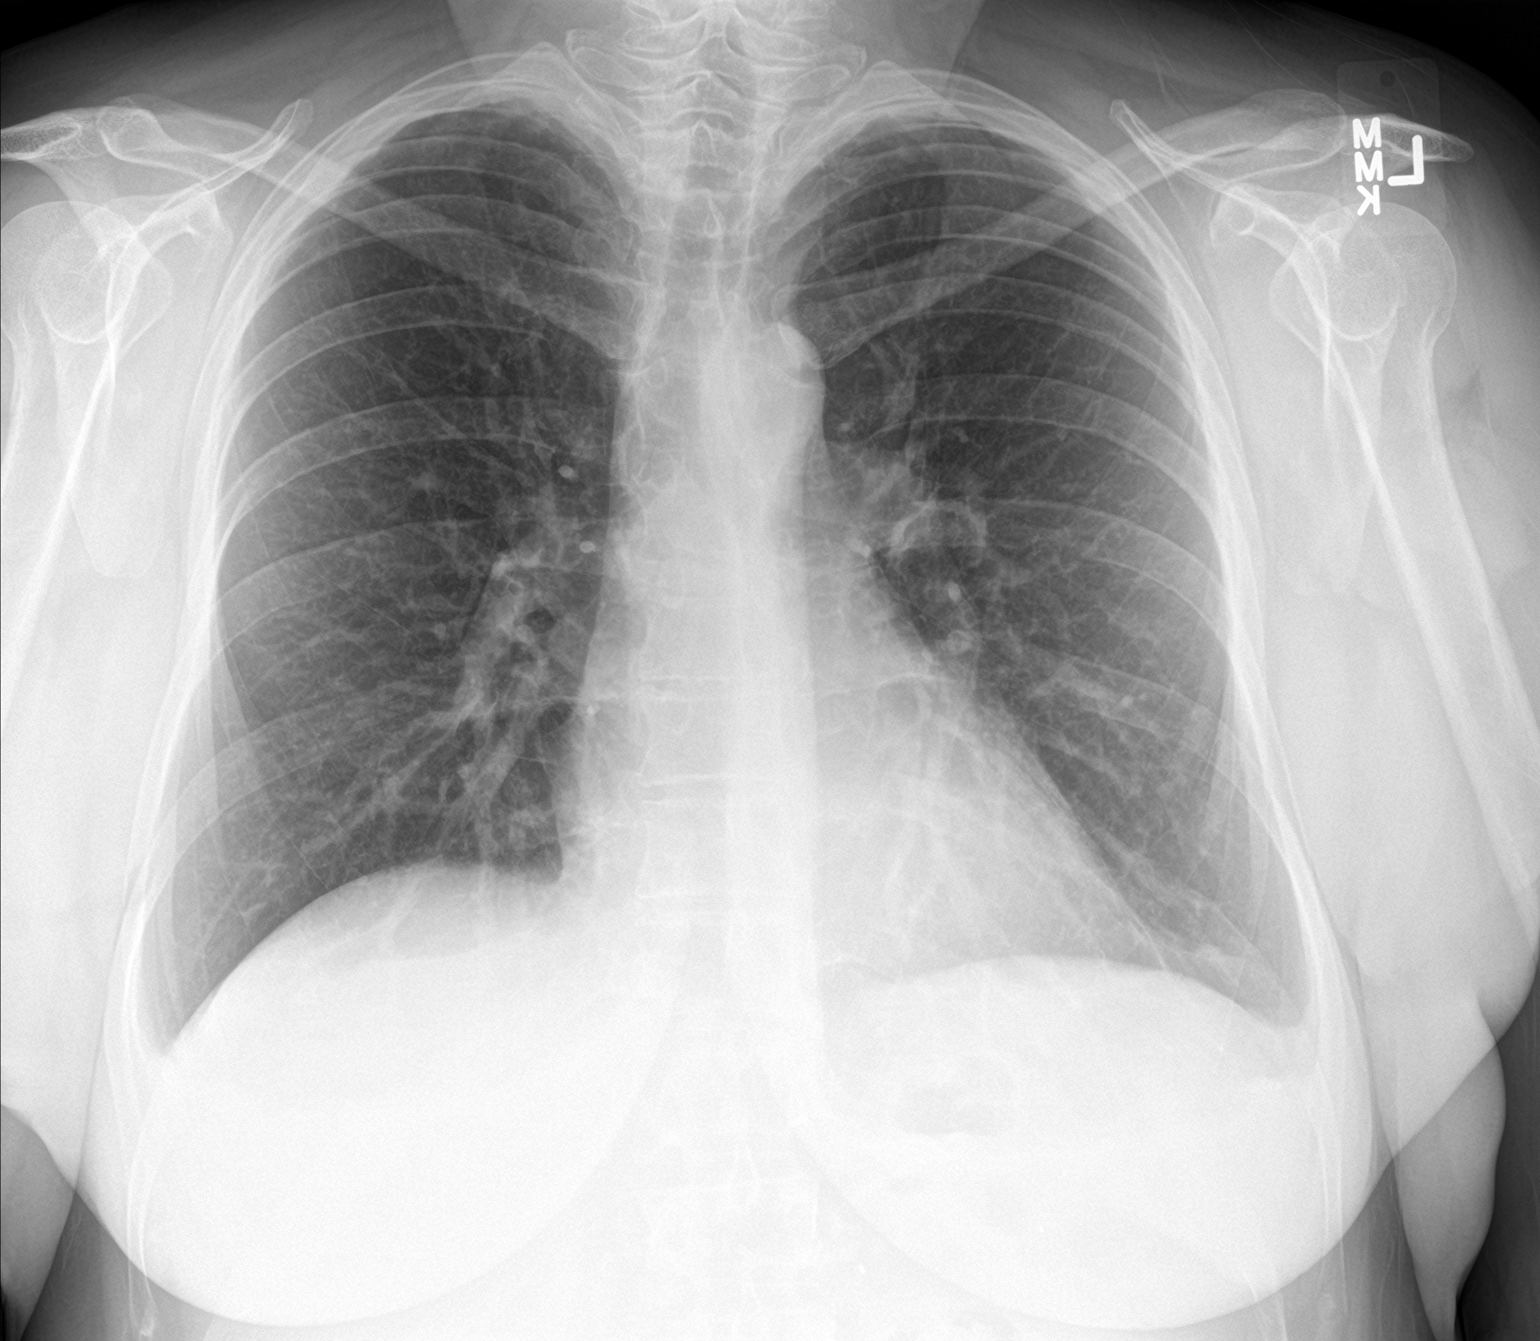

[chest lat]
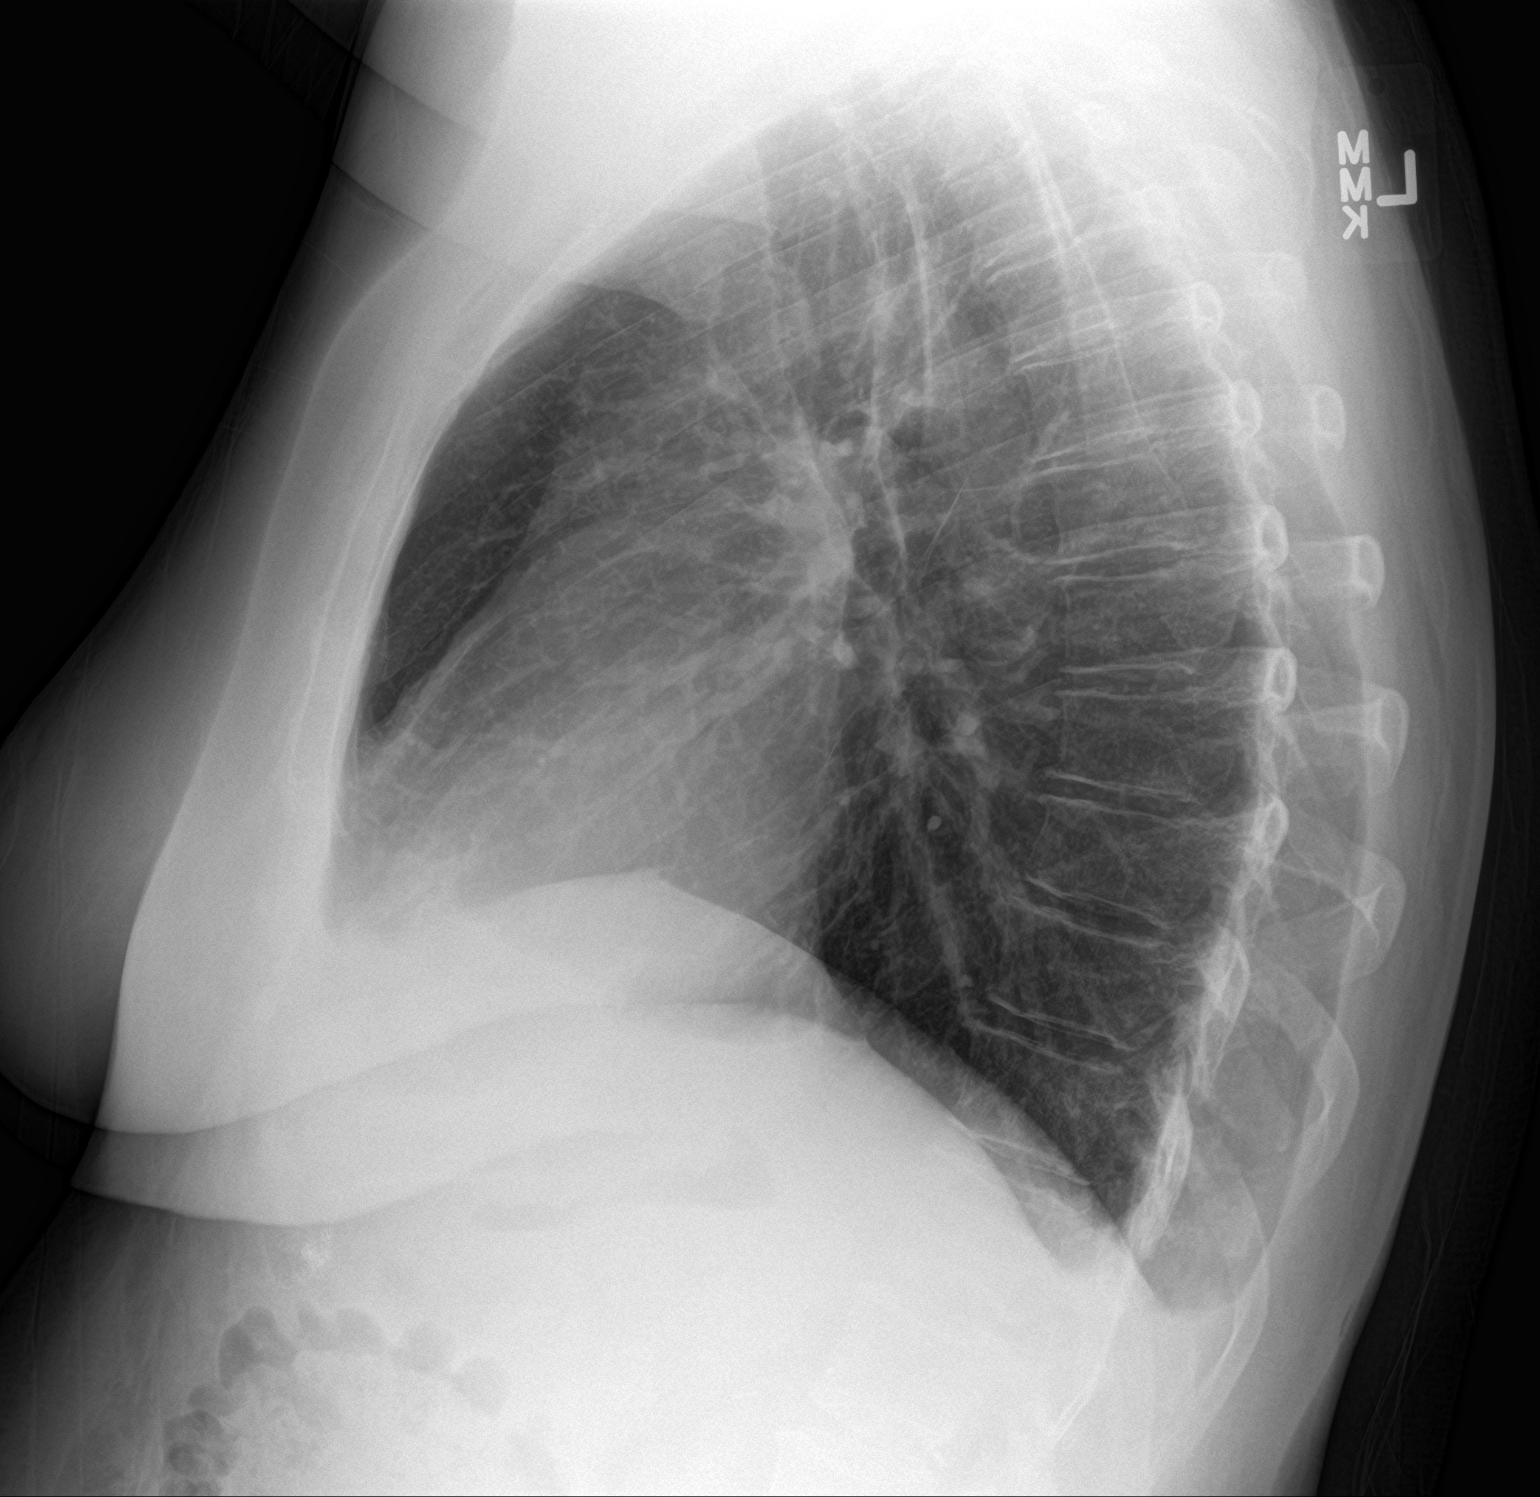

[2 of 2 positions shown; findings below may reference images not displayed]

FINDINGS: Normal mediastinum and cardiac silhouette. Normal pulmonary
vasculature. No evidence of effusion, infiltrate, or pneumothorax.
No acute bony abnormality.
IMPRESSION: No acute cardiopulmonary process.

## 2017-08-19 DIAGNOSIS — F29 Unspecified psychosis not due to a substance or known physiological condition: Secondary | ICD-10-CM | POA: Diagnosis not present

## 2017-08-25 DIAGNOSIS — F29 Unspecified psychosis not due to a substance or known physiological condition: Secondary | ICD-10-CM | POA: Diagnosis not present

## 2017-08-26 DIAGNOSIS — F603 Borderline personality disorder: Secondary | ICD-10-CM | POA: Diagnosis not present

## 2017-09-01 ENCOUNTER — Encounter (HOSPITAL_COMMUNITY): Payer: Self-pay

## 2017-09-01 DIAGNOSIS — F29 Unspecified psychosis not due to a substance or known physiological condition: Secondary | ICD-10-CM | POA: Diagnosis not present

## 2017-09-02 DIAGNOSIS — R69 Illness, unspecified: Secondary | ICD-10-CM | POA: Diagnosis not present

## 2017-09-02 DIAGNOSIS — E119 Type 2 diabetes mellitus without complications: Secondary | ICD-10-CM | POA: Diagnosis not present

## 2017-09-02 DIAGNOSIS — K912 Postsurgical malabsorption, not elsewhere classified: Secondary | ICD-10-CM | POA: Diagnosis not present

## 2017-09-02 DIAGNOSIS — Z9884 Bariatric surgery status: Secondary | ICD-10-CM | POA: Diagnosis not present

## 2017-09-02 DIAGNOSIS — K591 Functional diarrhea: Secondary | ICD-10-CM | POA: Diagnosis not present

## 2017-09-02 DIAGNOSIS — E669 Obesity, unspecified: Secondary | ICD-10-CM | POA: Diagnosis not present

## 2017-09-05 DIAGNOSIS — F251 Schizoaffective disorder, depressive type: Secondary | ICD-10-CM | POA: Diagnosis not present

## 2017-09-05 DIAGNOSIS — E119 Type 2 diabetes mellitus without complications: Secondary | ICD-10-CM | POA: Diagnosis not present

## 2017-09-05 DIAGNOSIS — E785 Hyperlipidemia, unspecified: Secondary | ICD-10-CM | POA: Diagnosis not present

## 2017-09-05 DIAGNOSIS — I10 Essential (primary) hypertension: Secondary | ICD-10-CM | POA: Diagnosis not present

## 2017-09-05 DIAGNOSIS — E669 Obesity, unspecified: Secondary | ICD-10-CM | POA: Diagnosis not present

## 2017-09-05 DIAGNOSIS — K219 Gastro-esophageal reflux disease without esophagitis: Secondary | ICD-10-CM | POA: Diagnosis not present

## 2017-09-05 DIAGNOSIS — F333 Major depressive disorder, recurrent, severe with psychotic symptoms: Secondary | ICD-10-CM | POA: Diagnosis not present

## 2017-09-06 DIAGNOSIS — F29 Unspecified psychosis not due to a substance or known physiological condition: Secondary | ICD-10-CM | POA: Diagnosis not present

## 2017-09-06 DIAGNOSIS — R51 Headache: Secondary | ICD-10-CM | POA: Diagnosis not present

## 2017-09-14 DIAGNOSIS — F29 Unspecified psychosis not due to a substance or known physiological condition: Secondary | ICD-10-CM | POA: Diagnosis not present

## 2017-09-14 DIAGNOSIS — F603 Borderline personality disorder: Secondary | ICD-10-CM | POA: Diagnosis not present

## 2017-09-17 ENCOUNTER — Encounter (HOSPITAL_COMMUNITY): Payer: Self-pay

## 2017-09-17 ENCOUNTER — Emergency Department (HOSPITAL_COMMUNITY): Payer: Medicare Other

## 2017-09-17 ENCOUNTER — Other Ambulatory Visit: Payer: Self-pay

## 2017-09-17 ENCOUNTER — Emergency Department (HOSPITAL_COMMUNITY)
Admission: EM | Admit: 2017-09-17 | Discharge: 2017-09-17 | Disposition: A | Discharge status: Home / Self Care | Payer: Medicare Other | Attending: Emergency Medicine | Admitting: Emergency Medicine

## 2017-09-17 DIAGNOSIS — R002 Palpitations: Secondary | ICD-10-CM | POA: Insufficient documentation

## 2017-09-17 DIAGNOSIS — I10 Essential (primary) hypertension: Secondary | ICD-10-CM | POA: Diagnosis not present

## 2017-09-17 DIAGNOSIS — Z85828 Personal history of other malignant neoplasm of skin: Secondary | ICD-10-CM | POA: Insufficient documentation

## 2017-09-17 DIAGNOSIS — E119 Type 2 diabetes mellitus without complications: Secondary | ICD-10-CM | POA: Diagnosis not present

## 2017-09-17 DIAGNOSIS — Z79899 Other long term (current) drug therapy: Secondary | ICD-10-CM | POA: Insufficient documentation

## 2017-09-17 DIAGNOSIS — Z7984 Long term (current) use of oral hypoglycemic drugs: Secondary | ICD-10-CM | POA: Diagnosis not present

## 2017-09-17 DIAGNOSIS — J90 Pleural effusion, not elsewhere classified: Secondary | ICD-10-CM | POA: Diagnosis not present

## 2017-09-17 LAB — I-STAT TROPONIN, ED
TROPONIN I, POC: 0 ng/mL (ref 0.00–0.08)
Troponin i, poc: 0 ng/mL (ref 0.00–0.08)

## 2017-09-17 LAB — CBC
HCT: 39.8 % (ref 36.0–46.0)
HEMOGLOBIN: 12.5 g/dL (ref 12.0–15.0)
MCH: 27.7 pg (ref 26.0–34.0)
MCHC: 31.4 g/dL (ref 30.0–36.0)
MCV: 88.2 fL (ref 78.0–100.0)
PLATELETS: 326 10*3/uL (ref 150–400)
RBC: 4.51 MIL/uL (ref 3.87–5.11)
RDW: 14.4 % (ref 11.5–15.5)
WBC: 9.1 10*3/uL (ref 4.0–10.5)

## 2017-09-17 LAB — BASIC METABOLIC PANEL
ANION GAP: 7 (ref 5–15)
BUN: 6 mg/dL (ref 6–20)
CHLORIDE: 109 mmol/L (ref 101–111)
CO2: 24 mmol/L (ref 22–32)
Calcium: 9.8 mg/dL (ref 8.9–10.3)
Creatinine, Ser: 0.87 mg/dL (ref 0.44–1.00)
GFR calc Af Amer: >60 mL/min (ref >60)
GLUCOSE: 122 mg/dL — AB (ref 65–99)
POTASSIUM: 3.9 mmol/L (ref 3.5–5.1)
Sodium: 140 mmol/L (ref 135–145)

## 2017-09-17 LAB — TSH: TSH: 2.794 u[IU]/mL (ref 0.350–4.500)

## 2017-09-17 LAB — MAGNESIUM: Magnesium: 1.9 mg/dL (ref 1.7–2.4)

## 2017-09-17 LAB — LITHIUM LEVEL: Lithium Lvl: 0.44 mmol/L — ABNORMAL LOW (ref 0.60–1.20)

## 2017-09-17 MED ORDER — SODIUM CHLORIDE 0.9 % IV BOLUS (SEPSIS)
1000.0000 mL | Freq: Once | INTRAVENOUS | Status: AC
Start: 2017-09-17 — End: 2017-09-17
  Administered 2017-09-17: 1000 mL via INTRAVENOUS
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
## 2017-09-17 NOTE — Discharge Instructions (Signed)
Discontinue your trazodone if possible, or take a smaller dose. Call your psychiatrist tomorrow and let them know that you were found to have a prolonged QT interval on your EKG while in the emergency department, and you are experiencing palpitations. They may make recommendations to change your medications. Follow-up with your primary care physician for reevaluation of your symptoms. They may fit you for a Holter monitor for evaluation of your palpitations. Return to the ED immediately if any concerning signs or symptoms develop such as shortness of breath, chest pain, or passing out.

## 2017-09-17 NOTE — ED Triage Notes (Signed)
Pt reports she came in for tachycardia. She reports taking 2 vistaril at home.She also states she felt a rush to her head. She reports symptoms onset of 1200. Pt slow to answer questions in triage. Extensive mental health hx and reports she had Invega injection earlier this week.

## 2017-09-17 NOTE — ED Notes (Signed)
Pt ambulated to bathroom 

## 2017-09-17 NOTE — ED Notes (Signed)
Pt stable, ambulatory, states understanding of discharge instructions 

## 2017-09-19 DIAGNOSIS — Z23 Encounter for immunization: Secondary | ICD-10-CM | POA: Diagnosis not present

## 2017-09-19 DIAGNOSIS — R9431 Abnormal electrocardiogram [ECG] [EKG]: Secondary | ICD-10-CM | POA: Diagnosis not present

## 2017-09-19 DIAGNOSIS — R002 Palpitations: Secondary | ICD-10-CM | POA: Diagnosis not present

## 2017-09-22 DIAGNOSIS — Z79899 Other long term (current) drug therapy: Secondary | ICD-10-CM | POA: Diagnosis not present

## 2017-09-22 DIAGNOSIS — E785 Hyperlipidemia, unspecified: Secondary | ICD-10-CM | POA: Diagnosis not present

## 2017-09-22 DIAGNOSIS — K219 Gastro-esophageal reflux disease without esophagitis: Secondary | ICD-10-CM | POA: Diagnosis not present

## 2017-09-22 DIAGNOSIS — R440 Auditory hallucinations: Secondary | ICD-10-CM | POA: Diagnosis not present

## 2017-09-22 DIAGNOSIS — T43592A Poisoning by other antipsychotics and neuroleptics, intentional self-harm, initial encounter: Secondary | ICD-10-CM | POA: Diagnosis not present

## 2017-09-22 DIAGNOSIS — R45851 Suicidal ideations: Secondary | ICD-10-CM | POA: Diagnosis not present

## 2017-09-22 DIAGNOSIS — Z880 Allergy status to penicillin: Secondary | ICD-10-CM | POA: Diagnosis not present

## 2017-09-22 DIAGNOSIS — T50902A Poisoning by unspecified drugs, medicaments and biological substances, intentional self-harm, initial encounter: Secondary | ICD-10-CM | POA: Diagnosis not present

## 2017-09-22 DIAGNOSIS — F251 Schizoaffective disorder, depressive type: Secondary | ICD-10-CM | POA: Diagnosis not present

## 2017-09-22 DIAGNOSIS — Z915 Personal history of self-harm: Secondary | ICD-10-CM | POA: Diagnosis not present

## 2017-09-22 DIAGNOSIS — Z7984 Long term (current) use of oral hypoglycemic drugs: Secondary | ICD-10-CM | POA: Diagnosis not present

## 2017-09-22 DIAGNOSIS — F29 Unspecified psychosis not due to a substance or known physiological condition: Secondary | ICD-10-CM | POA: Diagnosis not present

## 2017-09-22 DIAGNOSIS — I10 Essential (primary) hypertension: Secondary | ICD-10-CM | POA: Diagnosis not present

## 2017-09-22 DIAGNOSIS — F419 Anxiety disorder, unspecified: Secondary | ICD-10-CM | POA: Diagnosis not present

## 2017-09-22 DIAGNOSIS — Z888 Allergy status to other drugs, medicaments and biological substances status: Secondary | ICD-10-CM | POA: Diagnosis not present

## 2017-09-22 DIAGNOSIS — E119 Type 2 diabetes mellitus without complications: Secondary | ICD-10-CM | POA: Diagnosis not present

## 2017-09-22 DIAGNOSIS — F329 Major depressive disorder, single episode, unspecified: Secondary | ICD-10-CM | POA: Diagnosis not present

## 2017-09-23 DIAGNOSIS — F251 Schizoaffective disorder, depressive type: Secondary | ICD-10-CM | POA: Diagnosis not present

## 2017-09-23 DIAGNOSIS — T50902A Poisoning by unspecified drugs, medicaments and biological substances, intentional self-harm, initial encounter: Secondary | ICD-10-CM | POA: Diagnosis not present

## 2017-09-24 DIAGNOSIS — T50902A Poisoning by unspecified drugs, medicaments and biological substances, intentional self-harm, initial encounter: Secondary | ICD-10-CM | POA: Diagnosis not present

## 2017-09-24 DIAGNOSIS — F251 Schizoaffective disorder, depressive type: Secondary | ICD-10-CM | POA: Diagnosis not present

## 2017-09-25 DIAGNOSIS — T50902A Poisoning by unspecified drugs, medicaments and biological substances, intentional self-harm, initial encounter: Secondary | ICD-10-CM | POA: Diagnosis not present

## 2017-09-25 DIAGNOSIS — F251 Schizoaffective disorder, depressive type: Secondary | ICD-10-CM | POA: Diagnosis not present

## 2017-09-26 DIAGNOSIS — T50902A Poisoning by unspecified drugs, medicaments and biological substances, intentional self-harm, initial encounter: Secondary | ICD-10-CM | POA: Diagnosis not present

## 2017-09-26 DIAGNOSIS — F333 Major depressive disorder, recurrent, severe with psychotic symptoms: Secondary | ICD-10-CM | POA: Diagnosis not present

## 2017-09-26 DIAGNOSIS — F251 Schizoaffective disorder, depressive type: Secondary | ICD-10-CM | POA: Diagnosis not present

## 2017-09-27 DIAGNOSIS — T1491XA Suicide attempt, initial encounter: Secondary | ICD-10-CM | POA: Diagnosis not present

## 2017-09-27 DIAGNOSIS — T50902A Poisoning by unspecified drugs, medicaments and biological substances, intentional self-harm, initial encounter: Secondary | ICD-10-CM | POA: Diagnosis not present

## 2017-09-27 DIAGNOSIS — F251 Schizoaffective disorder, depressive type: Secondary | ICD-10-CM | POA: Diagnosis not present

## 2017-09-27 DIAGNOSIS — R7301 Impaired fasting glucose: Secondary | ICD-10-CM | POA: Diagnosis not present

## 2017-09-28 DIAGNOSIS — G4700 Insomnia, unspecified: Secondary | ICD-10-CM | POA: Diagnosis present

## 2017-09-28 DIAGNOSIS — Z6831 Body mass index (BMI) 31.0-31.9, adult: Secondary | ICD-10-CM | POA: Diagnosis not present

## 2017-09-28 DIAGNOSIS — R7301 Impaired fasting glucose: Secondary | ICD-10-CM | POA: Diagnosis not present

## 2017-09-28 DIAGNOSIS — F4481 Dissociative identity disorder: Secondary | ICD-10-CM | POA: Diagnosis not present

## 2017-09-28 DIAGNOSIS — Z888 Allergy status to other drugs, medicaments and biological substances status: Secondary | ICD-10-CM | POA: Diagnosis not present

## 2017-09-28 DIAGNOSIS — T50902A Poisoning by unspecified drugs, medicaments and biological substances, intentional self-harm, initial encounter: Secondary | ICD-10-CM | POA: Diagnosis not present

## 2017-09-28 DIAGNOSIS — T1491XA Suicide attempt, initial encounter: Secondary | ICD-10-CM | POA: Diagnosis not present

## 2017-09-28 DIAGNOSIS — E669 Obesity, unspecified: Secondary | ICD-10-CM | POA: Diagnosis not present

## 2017-09-28 DIAGNOSIS — F251 Schizoaffective disorder, depressive type: Secondary | ICD-10-CM | POA: Diagnosis not present

## 2017-09-28 DIAGNOSIS — F419 Anxiety disorder, unspecified: Secondary | ICD-10-CM | POA: Diagnosis present

## 2017-09-28 DIAGNOSIS — F333 Major depressive disorder, recurrent, severe with psychotic symptoms: Secondary | ICD-10-CM | POA: Diagnosis not present

## 2017-09-28 DIAGNOSIS — Z915 Personal history of self-harm: Secondary | ICD-10-CM | POA: Diagnosis not present

## 2017-09-28 DIAGNOSIS — Z880 Allergy status to penicillin: Secondary | ICD-10-CM | POA: Diagnosis not present

## 2017-10-05 DIAGNOSIS — F29 Unspecified psychosis not due to a substance or known physiological condition: Secondary | ICD-10-CM | POA: Diagnosis not present

## 2017-10-07 DIAGNOSIS — Z751 Person awaiting admission to adequate facility elsewhere: Secondary | ICD-10-CM | POA: Diagnosis not present

## 2017-10-07 DIAGNOSIS — F329 Major depressive disorder, single episode, unspecified: Secondary | ICD-10-CM | POA: Diagnosis not present

## 2017-10-07 DIAGNOSIS — F418 Other specified anxiety disorders: Secondary | ICD-10-CM | POA: Diagnosis not present

## 2017-10-07 DIAGNOSIS — R45851 Suicidal ideations: Secondary | ICD-10-CM | POA: Diagnosis not present

## 2017-10-07 DIAGNOSIS — F259 Schizoaffective disorder, unspecified: Secondary | ICD-10-CM | POA: Diagnosis not present

## 2017-10-07 DIAGNOSIS — Z79899 Other long term (current) drug therapy: Secondary | ICD-10-CM | POA: Diagnosis not present

## 2017-10-07 DIAGNOSIS — F29 Unspecified psychosis not due to a substance or known physiological condition: Secondary | ICD-10-CM | POA: Diagnosis not present

## 2017-10-07 DIAGNOSIS — F419 Anxiety disorder, unspecified: Secondary | ICD-10-CM | POA: Diagnosis not present

## 2017-10-07 DIAGNOSIS — Z7984 Long term (current) use of oral hypoglycemic drugs: Secondary | ICD-10-CM | POA: Diagnosis not present

## 2017-10-11 ENCOUNTER — Inpatient Hospital Stay (HOSPITAL_COMMUNITY)
Admission: AD | Admit: 2017-10-11 | Discharge: 2017-10-18 | DRG: 885 | Disposition: A | Discharge status: Home / Self Care | Payer: Medicare Other | Source: Intra-hospital | Attending: Psychiatry | Admitting: Psychiatry

## 2017-10-11 ENCOUNTER — Encounter (HOSPITAL_COMMUNITY): Payer: Self-pay

## 2017-10-11 DIAGNOSIS — K219 Gastro-esophageal reflux disease without esophagitis: Secondary | ICD-10-CM | POA: Diagnosis present

## 2017-10-11 DIAGNOSIS — Z79899 Other long term (current) drug therapy: Secondary | ICD-10-CM

## 2017-10-11 DIAGNOSIS — Z8051 Family history of malignant neoplasm of kidney: Secondary | ICD-10-CM | POA: Diagnosis not present

## 2017-10-11 DIAGNOSIS — Z818 Family history of other mental and behavioral disorders: Secondary | ICD-10-CM

## 2017-10-11 DIAGNOSIS — Z6331 Absence of family member due to military deployment: Secondary | ICD-10-CM | POA: Diagnosis not present

## 2017-10-11 DIAGNOSIS — R5383 Other fatigue: Secondary | ICD-10-CM | POA: Diagnosis not present

## 2017-10-11 DIAGNOSIS — F39 Unspecified mood [affective] disorder: Secondary | ICD-10-CM | POA: Diagnosis not present

## 2017-10-11 DIAGNOSIS — Z880 Allergy status to penicillin: Secondary | ICD-10-CM

## 2017-10-11 DIAGNOSIS — E559 Vitamin D deficiency, unspecified: Secondary | ICD-10-CM | POA: Diagnosis present

## 2017-10-11 DIAGNOSIS — Z87442 Personal history of urinary calculi: Secondary | ICD-10-CM

## 2017-10-11 DIAGNOSIS — Z888 Allergy status to other drugs, medicaments and biological substances status: Secondary | ICD-10-CM | POA: Diagnosis not present

## 2017-10-11 DIAGNOSIS — Z8249 Family history of ischemic heart disease and other diseases of the circulatory system: Secondary | ICD-10-CM

## 2017-10-11 DIAGNOSIS — R45851 Suicidal ideations: Secondary | ICD-10-CM | POA: Diagnosis not present

## 2017-10-11 DIAGNOSIS — Z881 Allergy status to other antibiotic agents status: Secondary | ICD-10-CM

## 2017-10-11 DIAGNOSIS — Z915 Personal history of self-harm: Secondary | ICD-10-CM | POA: Diagnosis not present

## 2017-10-11 DIAGNOSIS — G4700 Insomnia, unspecified: Secondary | ICD-10-CM | POA: Diagnosis not present

## 2017-10-11 DIAGNOSIS — R4584 Anhedonia: Secondary | ICD-10-CM | POA: Diagnosis not present

## 2017-10-11 DIAGNOSIS — Z7984 Long term (current) use of oral hypoglycemic drugs: Secondary | ICD-10-CM

## 2017-10-11 DIAGNOSIS — I10 Essential (primary) hypertension: Secondary | ICD-10-CM | POA: Diagnosis present

## 2017-10-11 DIAGNOSIS — Z599 Problem related to housing and economic circumstances, unspecified: Secondary | ICD-10-CM | POA: Diagnosis not present

## 2017-10-11 DIAGNOSIS — Z85828 Personal history of other malignant neoplasm of skin: Secondary | ICD-10-CM | POA: Diagnosis not present

## 2017-10-11 DIAGNOSIS — F333 Major depressive disorder, recurrent, severe with psychotic symptoms: Principal | ICD-10-CM | POA: Diagnosis present

## 2017-10-11 DIAGNOSIS — Z86711 Personal history of pulmonary embolism: Secondary | ICD-10-CM | POA: Diagnosis not present

## 2017-10-11 DIAGNOSIS — Z6831 Body mass index (BMI) 31.0-31.9, adult: Secondary | ICD-10-CM | POA: Diagnosis not present

## 2017-10-11 DIAGNOSIS — F4310 Post-traumatic stress disorder, unspecified: Secondary | ICD-10-CM | POA: Diagnosis not present

## 2017-10-11 DIAGNOSIS — F419 Anxiety disorder, unspecified: Secondary | ICD-10-CM | POA: Diagnosis not present

## 2017-10-11 DIAGNOSIS — R440 Auditory hallucinations: Secondary | ICD-10-CM | POA: Diagnosis not present

## 2017-10-11 DIAGNOSIS — R443 Hallucinations, unspecified: Secondary | ICD-10-CM | POA: Diagnosis not present

## 2017-10-11 DIAGNOSIS — M1712 Unilateral primary osteoarthritis, left knee: Secondary | ICD-10-CM | POA: Diagnosis present

## 2017-10-11 MED ORDER — PALIPERIDONE ER 3 MG PO TB24
3.0000 mg | ORAL_TABLET | Freq: Every day | ORAL | Status: DC
  Administered 2017-10-11 – 2017-10-14 (×4): 3 mg via ORAL
  Filled 2017-10-11 (×7): qty 1

## 2017-10-11 MED ORDER — ACETAMINOPHEN 325 MG PO TABS
650.0000 mg | ORAL_TABLET | Freq: Four times a day (QID) | ORAL | Status: DC | PRN
  Administered 2017-10-13 – 2017-10-16 (×4): 650 mg via ORAL
  Filled 2017-10-11 (×4): qty 2

## 2017-10-11 MED ORDER — METFORMIN HCL 500 MG PO TABS
500.0000 mg | ORAL_TABLET | Freq: Two times a day (BID) | ORAL | Status: DC
  Filled 2017-10-11 (×15): qty 1

## 2017-10-11 MED ORDER — LITHIUM CARBONATE 300 MG PO CAPS
600.0000 mg | ORAL_CAPSULE | Freq: Every day | ORAL | Status: DC
  Administered 2017-10-11 – 2017-10-17 (×7): 600 mg via ORAL
  Filled 2017-10-11 (×9): qty 2

## 2017-10-11 MED ORDER — TRAZODONE HCL 100 MG PO TABS
200.0000 mg | ORAL_TABLET | Freq: Every day | ORAL | Status: DC
  Administered 2017-10-11: 200 mg via ORAL
  Filled 2017-10-11 (×3): qty 2

## 2017-10-11 MED ORDER — MAGNESIUM HYDROXIDE 400 MG/5ML PO SUSP: 30.0000 mL | Freq: Every day | ORAL | Status: DC | PRN

## 2017-10-11 MED ORDER — ALUM & MAG HYDROXIDE-SIMETH 200-200-20 MG/5ML PO SUSP: 30.0000 mL | ORAL | Status: DC | PRN

## 2017-10-11 MED ORDER — METOPROLOL TARTRATE 25 MG PO TABS
25.0000 mg | ORAL_TABLET | Freq: Every day | ORAL | Status: DC
  Administered 2017-10-12 – 2017-10-17 (×4): 25 mg via ORAL
  Filled 2017-10-11 (×9): qty 1

## 2017-10-11 NOTE — Progress Notes (Signed)
Pt sts she does not feel safe because "the voices in my head are telling me to hurt myself." Pt was found making a noose out of her bed sheets. Pt was deescalated verbally. Pt sts she does not feel safe by herself. She can not currently contract for safety. Patient placed on a 1:1 for safety. Pt in no distress. Sitter sitting with patient in hall currently.

## 2017-10-11 NOTE — Progress Notes (Signed)
[name] [name] is a 46 year old female being admitted voluntarily to [facility] from [facility] ED.  She came in after her therapist encouraged her to see emergency treatment.  She reported that her voice, [place], has been talking to her and telling her to kill herself.  She has history of multiple suicide attempts in the past due to [place] telling her to do so.  She had been working at [employer] and recently quit because the voices were getting worse and she was unable to concentrate.  She was recently discharged from [facility] where she was receiving ECT.  She is now on maintenance of ECT with next treatment due 10/17/17.  She also reported being on [place] and dose is due 10/12/17.  She had been taking her medications as prescribed.  She denies any pain or discomfrot and appeared to be in no physical distress.  She reported having Gastric Sleeve surgery 05/24/17 and has done well since then, losing almost 40 pounds.  During [facility] admission, she continues to report suicidal ideation and was able to contract for safety on the unit.  She constantly hears the voice of [place] telling her to kill herself.  Affect was flat and she was slow to respond to questions.  Oriented her to the unit.  Admission paperwork completed and signed.  Belongings searched and secured in locker # 65.  Skin assessment completed and noted old well healed scars on abdomen from past suicide attempt .  Q 15 minute checks initiated for safety.  We will monitor the progress towards her goals.
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
## 2017-10-11 NOTE — Progress Notes (Signed)
Adult Psychoeducational Group Note  Date:  10/11/2017 Time:  8:48 PM  Group Topic/Focus:  Wrap-Up Group:   The focus of this group is to help patients review their daily goal of treatment and discuss progress on daily workbooks.  Participation Level:  Minimal  Participation Quality:  Appropriate  Affect:  Appropriate  Cognitive:  Alert  Insight: Appropriate  Engagement in Group:  Engaged  Modes of Intervention:  Discussion  Additional Comments:  Pt rated her day 6/10. Her goal was to get into a facility and she was able to do that.   Wynelle Fanny R 10/11/2017, 8:48 PM

## 2017-10-11 NOTE — Progress Notes (Signed)
Nursing Progress Note: 7p-7a D: Pt currently presents with a anxious/cautious/preoccupied/pacing affect and behavior. Pt states "I want to take my meds, but [place] (auditory hallucination) wants me to take off the bathroom door. I don't like it." Interacting appropriately with the milieu. Pt reports fair sleep during the previous night with current medication regimen. Pt did attend wrap-up group.  A: Pt provided with medications per providers orders. Pt's labs and vitals were monitored throughout the night. Pt supported emotionally and encouraged to express concerns and questions. Pt educated on medications.  R: Pt's safety ensured with 15 minute and environmental checks. Pt currently denies SI and HI and endorses AVH. Pt verbally contracts to seek staff if SI,HI, or AVH occurs and to consult with staff before acting on any harmful thoughts. Will continue to monitor.

## 2017-10-11 NOTE — Progress Notes (Signed)
Writer introduced self to pt. Pt denies any active SI. Pt verbally contracts for safety. Pt continues to endorse AH. Pt requested a toiletry bag and towels to shower with. Pt provided with the requested items.

## 2017-10-11 NOTE — Tx Team (Signed)
Initial Treatment Plan 10/11/2017 6:08 PM [name] [name] BMS:[id number]    PATIENT STRESSORS: Financial difficulties Marital or family conflict Occupational concerns   PATIENT STRENGTHS: Average or above average intelligence Capable of independent living General fund of knowledge Motivation for treatment/growth   PATIENT IDENTIFIED PROBLEMS: Depression  Suicidal ideation  Psychosis  "Get a handle on [place]"               DISCHARGE CRITERIA:  Improved stabilization in mood, thinking, and/or behavior Need for constant or close observation no longer present Verbal commitment to aftercare and medication compliance  PRELIMINARY DISCHARGE PLAN: Outpatient therapy Medication management  PATIENT/FAMILY INVOLVEMENT: This treatment plan has been presented to and reviewed with the patient, [name] [name].  The patient and family have been given the opportunity to ask questions and make suggestions.  Windell Moment, RN 10/11/2017, 6:08 PM

## 2017-10-11 NOTE — Progress Notes (Signed)
Patient ID: [name] [name], female   DOB: [birth year], 46 y.o.   MRN: [id number] Per State regulations 482.30 this chart was reviewed for medical necessity with respect to the patient's admission/duration of stay.    Next review date: 10/14/17  Debarah Crape, BSN, RN-BC  Case Manager

## 2017-10-12 DIAGNOSIS — R440 Auditory hallucinations: Secondary | ICD-10-CM

## 2017-10-12 DIAGNOSIS — R4584 Anhedonia: Secondary | ICD-10-CM

## 2017-10-12 DIAGNOSIS — F333 Major depressive disorder, recurrent, severe with psychotic symptoms: Principal | ICD-10-CM

## 2017-10-12 DIAGNOSIS — Z6331 Absence of family member due to military deployment: Secondary | ICD-10-CM

## 2017-10-12 DIAGNOSIS — R5383 Other fatigue: Secondary | ICD-10-CM

## 2017-10-12 DIAGNOSIS — Z818 Family history of other mental and behavioral disorders: Secondary | ICD-10-CM

## 2017-10-12 DIAGNOSIS — R45851 Suicidal ideations: Secondary | ICD-10-CM

## 2017-10-12 DIAGNOSIS — G4700 Insomnia, unspecified: Secondary | ICD-10-CM

## 2017-10-12 LAB — HEMOGLOBIN A1C
HEMOGLOBIN A1C: 5 % (ref 4.8–5.6)
MEAN PLASMA GLUCOSE: 96.8 mg/dL

## 2017-10-12 LAB — HEPATIC FUNCTION PANEL
ALBUMIN: 3.9 g/dL (ref 3.5–5.0)
ALK PHOS: 90 U/L (ref 38–126)
ALT: 8 U/L — AB (ref 14–54)
AST: 13 U/L — AB (ref 15–41)
Bilirubin, Direct: <0.1 mg/dL — ABNORMAL LOW (ref 0.1–0.5)
Total Bilirubin: 0.5 mg/dL (ref 0.3–1.2)
Total Protein: 6.9 g/dL (ref 6.5–8.1)

## 2017-10-12 LAB — LIPID PANEL
Cholesterol: 182 mg/dL (ref 0–200)
HDL: 42 mg/dL (ref >40)
LDL CALC: 114 mg/dL — AB (ref 0–99)
Total CHOL/HDL Ratio: 4.3 RATIO
Triglycerides: 131 mg/dL (ref <150)
VLDL: 26 mg/dL (ref 0–40)

## 2017-10-12 LAB — TSH: TSH: 2.662 u[IU]/mL (ref 0.350–4.500)

## 2017-10-12 LAB — LITHIUM LEVEL: Lithium Lvl: 0.76 mmol/L (ref 0.60–1.20)

## 2017-10-12 MED ORDER — LORAZEPAM 0.5 MG PO TABS
0.5000 mg | ORAL_TABLET | Freq: Four times a day (QID) | ORAL | Status: DC | PRN
  Administered 2017-10-12 – 2017-10-18 (×10): 0.5 mg via ORAL
  Filled 2017-10-12 (×10): qty 1

## 2017-10-12 MED ORDER — MIRTAZAPINE 7.5 MG PO TABS
7.5000 mg | ORAL_TABLET | Freq: Every day | ORAL | Status: DC
  Administered 2017-10-12 – 2017-10-17 (×6): 7.5 mg via ORAL
  Filled 2017-10-12 (×8): qty 1

## 2017-10-12 NOTE — [facility] Suicide Risk Assessment (Signed)
[place] INPATIENT:  Family/Significant Other Suicide Prevention Education  Suicide Prevention Education:  Patient Refusal for Family/Significant Other Suicide Prevention Education: The patient [name] [name] has refused to provide written consent for family/significant other to be provided Family/Significant Other Suicide Prevention Education during admission and/or prior to discharge.  Physician notified.  Frutoso Chase Garv Kuechle 10/12/2017, 10:50 AM

## 2017-10-12 NOTE — Tx Team (Signed)
Interdisciplinary Treatment and Diagnostic Plan Update  10/12/2017 Time of Session: 10:45 AM  [name] [name] MRN: [id number]  Principal Diagnosis: :MDD (major depressive disorder), recurrent, severe, with psychosis ([place])  Secondary Diagnoses: Active Problems:   MDD (major depressive disorder), recurrent, severe, with psychosis ([place])   Current Medications:  Current Facility-Administered Medications  Medication Dose Route Frequency Provider Last Rate Last Dose  . acetaminophen (TYLENOL) tablet 650 mg  650 mg Oral Q6H PRN Laverle Hobby, PA-C      . alum & mag hydroxide-simeth (MAALOX/MYLANTA) 200-200-20 MG/5ML suspension 30 mL  30 mL Oral Q4H PRN Patriciaann Clan E, PA-C      . lithium carbonate capsule 600 mg  600 mg Oral QHS Patriciaann Clan E, PA-C   600 mg at 10/11/17 2119  . magnesium hydroxide (MILK OF MAGNESIA) suspension 30 mL  30 mL Oral Daily PRN Laverle Hobby, PA-C      . metFORMIN (GLUCOPHAGE) tablet 500 mg  500 mg Oral BID WC Simon, Spencer E, PA-C      . metoprolol tartrate (LOPRESSOR) tablet 25 mg  25 mg Oral QHS Simon, Spencer E, PA-C      . paliperidone (INVEGA) 24 hr tablet 3 mg  3 mg Oral QHS Patriciaann Clan E, PA-C   3 mg at 10/11/17 2119  . traZODone (DESYREL) tablet 200 mg  200 mg Oral QHS Laverle Hobby, PA-C   200 mg at 10/11/17 2119    PTA Medications: Prescriptions Prior to Admission  Medication Sig Dispense Refill Last Dose  . hydrOXYzine (VISTARIL) 25 MG capsule Take 25 mg by mouth 3 (three) times daily.   09/17/2017 at 1245  . lithium carbonate 300 MG capsule Take 600 mg by mouth at bedtime.   09/16/2017 at pm  . metFORMIN (GLUCOPHAGE) 500 MG tablet Take 1 tablet (500 mg total) by mouth 2 (two) times daily with a meal. 120 tablet 1 09/16/2017 at pm  . metoprolol tartrate (LOPRESSOR) 25 MG tablet Take 25 mg by mouth at bedtime.    09/16/2017 at [place]  . Multiple Vitamin (MULTIVITAMIN WITH MINERALS) TABS tablet Take 1 tablet by mouth daily.   couple days ago   . paliperidone (INVEGA SUSTENNA) 156 MG/ML SUSP injection Inject 156 mg into the muscle every 30 (thirty) days. Last injection 09/14/17 at [facility], [facility]   09/14/2017  . paliperidone (INVEGA) 3 MG 24 hr tablet Take 3 mg by mouth at bedtime.   09/16/2017 at pm  . traZODone (DESYREL) 100 MG tablet Take 200 mg by mouth at bedtime.    09/16/2017 at pm    Treatment Modalities: Medication Management, Group therapy, Case management,  1 to 1 session with clinician, Psychoeducation, Recreational therapy.  Patient Stressors: Financial difficulties Marital or family conflict Occupational concerns  Patient Strengths: Average or above average intelligence Capable of independent living General fund of knowledge Motivation for treatment/growth   Physician Treatment Plan for Primary Diagnosis:  MDD (major depressive disorder), recurrent, severe, with psychosis ([place])  Long Term Goal(s): Improvement in symptoms so as ready for discharge  Short Term Goals: Ability to identify changes in lifestyle to reduce recurrence of condition will improve Ability to identify triggers associated with substance abuse/mental health issues will improve Ability to verbalize feelings will improve Ability to disclose and discuss suicidal ideas Ability to demonstrate self-control will improve Ability to identify and develop effective coping behaviors will improve Ability to maintain clinical measurements within normal limits will improve  Medication Management: Evaluate patient's response, side  effects, and tolerance of medication regimen.  Therapeutic Interventions: 1 to 1 sessions, Unit Group sessions and Medication administration.  Evaluation of Outcomes: Progressing  Physician Treatment Plan for Secondary Diagnosis: Active Problems:   MDD (major depressive disorder), recurrent, severe, with psychosis ([place])  Long Term Goal(s): Improvement in symptoms so as ready for discharge  Short Term Goals: Ability to  identify changes in lifestyle to reduce recurrence of condition will improve Ability to identify triggers associated with substance abuse/mental health issues will improve Ability to verbalize feelings will improve Ability to disclose and discuss suicidal ideas Ability to demonstrate self-control will improve Ability to identify and develop effective coping behaviors will improve Ability to maintain clinical measurements within normal limits will improve  Medication Management: Evaluate patient's response, side effects, and tolerance of medication regimen.  Therapeutic Interventions: 1 to 1 sessions, Unit Group sessions and Medication administration.  Evaluation of Outcomes: Progressing   RN Treatment Plan for Primary Diagnosis: MDD (major depressive disorder), recurrent, severe, with psychosis ([place])  Long Term Goal(s): Knowledge of disease and therapeutic regimen to maintain health will improve  Short Term Goals: Ability to remain free from injury will improve, Ability to verbalize feelings will improve, Ability to identify and develop effective coping behaviors will improve and Compliance with prescribed medications will improve  Medication Management: RN will administer medications as ordered by provider, will assess and evaluate patient's response and provide education to patient for prescribed medication. RN will report any adverse and/or side effects to prescribing provider.  Therapeutic Interventions: 1 on 1 counseling sessions, Psychoeducation, Medication administration, Evaluate responses to treatment, Monitor vital signs and CBGs as ordered, Perform/monitor CIWA, COWS, AIMS and Fall Risk screenings as ordered, Perform wound care treatments as ordered.  Evaluation of Outcomes: Progressing   LCSW Treatment Plan for Primary Diagnosis: MDD (major depressive disorder), recurrent, severe, with psychosis ([place])  Long Term Goal(s): Safe transition to appropriate next level of care at  discharge, Engage patient in therapeutic group addressing interpersonal concerns.  Short Term Goals: Engage patient in aftercare planning with referrals and resources, Facilitate acceptance of mental health diagnosis and concerns, Identify triggers associated with mental health/substance abuse issues and Increase skills for wellness and recovery  Therapeutic Interventions: Assess for all discharge needs, 1 to 1 time with Social worker, Explore available resources and support systems, Assess for adequacy in community support network, Educate family and significant other(s) on suicide prevention, Complete Psychosocial Assessment, Interpersonal group therapy.  Evaluation of Outcomes: Progressing   Progress in Treatment: Attending groups: No Participating in groups: No Taking medication as prescribed: Yes Toleration of medication: Yes, no side effects reported at this time Family/Significant other contact made: No Patient understands diagnosis: No, limited insight Discussing patient identified problems/goals with staff: Yes Medical problems stabilized or resolved: Yes Denies suicidal/homicidal ideation: Yes Issues/concerns per patient self-inventory: None Other: N/A  New problem(s) identified: None identified at this time.   New Short Term/Long Term Goal(s): Pt wants help with "Controling the voice in my head ([place])".   Discharge Plan or Barriers: Pt will return to her home in [facility]   Reason for Continuation of Hospitalization:  Depression Hallucinations Medication stabilization Suicidal ideation   Estimated Length of Stay: 10/17/17  Attendees: Patient: [name] [name]  10/12/2017  10:45 AM  Physician: Neita Garnet, MD 10/12/2017  10:45 AM  Nursing: Jonette Mate, RN 10/12/2017  10:45 AM  RN Care Manager: Lars Pinks, RN 10/12/2017  10:45 AM  Social Worker: Ripley Fraise, LCSW; Verdis Frederickson, Social Work  Intern 10/12/2017  10:45 AM  Recreational Therapist:  Victorino Sparrow, LRT 10/12/2017  10:45 AM  Other: Norberto Sorenson, [name] 10/12/2017  10:45 AM  Other:  10/12/2017  10:45 AM  Other: 10/12/2017  10:45 AM    Scribe for Treatment Team: Darleen Crocker, Student-Social Work 10/12/2017 10:45 AM
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
## 2017-10-12 NOTE — Progress Notes (Signed)
1:1 Progress Note D: Pt currently asleep in bed. Patient appropriate to situation. Pt in no current distress.  A: Sitter is currently at bedside. R: Pt remains safe on a 1:1 per MD orders.   

## 2017-10-12 NOTE — Progress Notes (Signed)
Patient ID: [name] [name], female   DOB: 12-01-1971, 46 y.o.   MRN: [id number]  1:1 Observation Nursing Note-  Patient is currently laying in bed with her eyes closed. Her respirations are even and unlabored at this time. Patient does not appear in any current distress. Q15 minute safety checks and 1:1 observation order is continued for safety.

## 2017-10-12 NOTE — BHH Suicide Risk Assessment (Signed)
[facility] Admission Suicide Risk Assessment   Nursing information obtained from:  Patient Demographic factors:  Caucasian, Living alone, Unemployed Current Mental Status:  Suicidal ideation indicated by patient Loss Factors:  Financial problems / change in socioeconomic status, Loss of significant relationship, Legal issues (credit card issues-possibly going to court over it) Historical Factors:  Prior suicide attempts Risk Reduction Factors:  Sense of responsibility to family  Total Time spent with patient: 45 minutes Principal Problem: Schizoaffective Disorder, Depressed   Diagnosis:   Patient Active Problem List   Diagnosis Date Noted  . MDD (major depressive disorder), recurrent, severe, with psychosis ([place]) [F33.3] 10/11/2017  . Clostridium difficile colitis [A04.72] 05/29/2017  . Abdominal pain [R10.9] 05/28/2017  . Morbid obesity ([place]) [E66.01] 05/24/2017  . Pleuritis [R09.1] 05/19/2017  . Pleural effusion [J90] 05/19/2017  . Schizoaffective disorder, depressive type ([place]) [F25.1] 02/04/2017  . Gastroesophageal reflux [K21.9] 01/24/2017  . Obesity, Class II, BMI 35-39.9 [E66.9] 01/24/2017  . Suicidal behavior with attempted self-injury ([place]) [T14.91XA] 01/24/2017  . HTN (hypertension) [I10] 03/30/2016  . Serotonin syndrome [G25.79] 03/30/2016  . Elevated blood pressure [IMO0001] 03/30/2016  . Leukocytosis [D72.829] 03/30/2016  . Precordial chest pain [R07.2] 03/30/2016  . Incisional hernia [K43.2] 12/24/2015  . Hyperprolactinemia ([place]) [E22.1] 09/11/2015  . Schizoaffective disorder, bipolar type ([place]) [F25.0] 09/10/2015  . Hx of borderline personality disorder [Z86.59] 09/10/2015  . Post traumatic stress disorder (PTSD) [F43.10] 09/10/2015  . Attempted suicide ([place]) [T14.91XA] 09/05/2015  . Prolonged Q-T interval on ECG [R94.31]   . Weight gain [R63.5] 03/06/2015  . [place] [R23.2] 03/06/2015  . Severe episode of recurrent major depressive disorder ([place]) [F33.2] 08/23/2014   . Iron deficiency anemia [D50.9] 11/15/2013  . Alcohol dependence ([place]) [F10.20] 06/02/2013  . Liver mass [R16.0] 05/28/2013  . Medication side effects [T88.7XXA] 05/07/2013  . Headache [R51] 04/03/2013  . Hx MRSA infection [Z86.14] 04/03/2013  . Postop check [Z09] 03/13/2013  . Postoperative wound infection [T81.49XA] 02/23/2013  . History of colostomy [IMO0002] 02/16/2013  . Borderline personality disorder ([place]) [F60.3] 08/16/2012    Class: Chronic  . Acute blood loss anemia [D62] 08/01/2012  . Major depressive disorder [F32.9] 08/01/2012  . Kidney stone [N20.0] 03/31/2012  . Hydronephrosis [N13.30] 03/31/2012    Continued Clinical Symptoms:  Alcohol Use Disorder Identification Test Final Score (AUDIT): 0 The "Alcohol Use Disorders Identification Test", Guidelines for Use in Primary Care, Second Edition.  World Pharmacologist [facility]). Score between 0-7:  no or low risk or alcohol related problems. Score between 8-15:  moderate risk of alcohol related problems. Score between 16-19:  high risk of alcohol related problems. Score 20 or above:  warrants further diagnostic evaluation for alcohol dependence and treatment.   CLINICAL FACTORS:  46 year old female, long history of depression, which has been severe, intermittent. Also describes history of chronic auditory hallucinations, present even when not feeling depressed . Presents due to severe depression, SI, auditory hallucinations, anxiety. Of note, currently in maintenance ECT  ( at [facility]). Last ECT 1-2 weeks ago, next one scheduled for next week.     Psychiatric Specialty Exam: Physical Exam  ROS  Blood pressure 108/63, pulse 97, temperature 98.7 F (37.1 C), temperature source Oral, resp. rate 18, height 5\' 3"  (1.6 m), weight 79.8 kg (176 lb), SpO2 98 %.Body mass index is 31.18 kg/m.  See admit note MSE     COGNITIVE FEATURES THAT CONTRIBUTE TO RISK:  Closed-mindedness and Loss of executive function    SUICIDE  RISK:  Moderate:  Frequent suicidal ideation with limited intensity, and duration, some specificity in terms of plans, no associated intent, good self-control, limited dysphoria/symptomatology, some risk factors present, and identifiable protective factors, including available and accessible social support.  PLAN OF CARE: Patient will be admitted to inpatient psychiatric unit for stabilization and safety. Will provide and encourage milieu participation. Provide medication management and maked adjustments as needed.  Will follow daily.    I certify that inpatient services furnished can reasonably be expected to improve the patient's condition.   Jenne Campus, MD 10/12/2017, 11:01 AM

## 2017-10-12 NOTE — Progress Notes (Addendum)
1:1 Progress Note D: Pt currently in bed asleep. Patient appropriate to situation. Pt in no current distress.  A: Sitter is currently at bedside. R: Pt remains safe on a 1:1 per MD orders.
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
## 2017-10-12 NOTE — Progress Notes (Signed)
Patient ID: [name] [name], female   DOB: 05-May-1971, 46 y.o.   MRN: [id number]    Fiana had an ECT appointment scheduled for 10/17/17.  This appointment has been cancelled and a new appointment will be made prior to discharge.  The name and number for the ECT appointment is Amy at [facility] at [phone].

## 2017-10-12 NOTE — BHH Group Notes (Signed)
LCSW Group Therapy Note  10/12/2017 1:15pm  Type of Therapy/Topic:  Group Therapy:  Balance in Life  Participation Level:  Did Not Attend  Description of Group:    This group will address the concept of balance and how it feels and looks when one is unbalanced. Patients will be encouraged to process areas in their lives that are out of balance and identify reasons for remaining unbalanced. Facilitators will guide patients in utilizing problem-solving interventions to address and correct the stressor making their life unbalanced. Understanding and applying boundaries will be explored and addressed for obtaining and maintaining a balanced life. Patients will be encouraged to explore ways to assertively make their unbalanced needs known to significant others in their lives, using other group members and facilitator for support and feedback.  Therapeutic Goals: 1. Patient will identify two or more emotions or situations they have that consume much of in their lives. 2. Patient will identify signs/triggers that life has become out of balance:  3. Patient will identify two ways to set boundaries in order to achieve balance in their lives:  4. Patient will demonstrate ability to communicate their needs through discussion and/or role plays  Summary of Patient Progress:      Therapeutic Modalities:   Cognitive Behavioral Therapy Solution-Focused Therapy Assertiveness Training  [name] Work 10/12/2017 1:43 PM

## 2017-10-12 NOTE — Progress Notes (Signed)
Adult Psychoeducational Group Note  Date:  10/12/2017 Time:  8:26 PM  Group Topic/Focus:  Wrap-Up Group:   The focus of this group is to help patients review their daily goal of treatment and discuss progress on daily workbooks.  Participation Level:  Minimal  Participation Quality:  Appropriate  Affect:  Flat  Cognitive:  Alert  Insight: Appropriate  Engagement in Group:  Engaged  Modes of Intervention:  Discussion  Additional Comments:  Pt stated that she had an ok day. Her goal is to get "[place]" under control.   Wynelle Fanny R 10/12/2017, 8:26 PM

## 2017-10-12 NOTE — Progress Notes (Signed)
Recreation Therapy Notes  Date: 10/12/17 Time: 1000 Location: 500 Hall Dayroom  Group Topic: Communication, Team Building, Problem Solving  Goal Area(s) Addresses:  Patient will effectively work with peer towards shared goal.  Patient will identify skills used to make activity successful.  Patient will identify how skills used during activity can be used to reach post d/c goals.   Behavioral Response: Engaged  Intervention: STEM Activity  Activity: Geophysicist/field seismologist. In teams patients were given 12 plastic drinking straws and a length of masking tape. Using the materials provided patients were asked to build a landing pad to catch a golf ball dropped from approximately 6 feet in the air.   Education: Education officer, community, Discharge Planning   Education Outcome: Acknowledges education/In group clarification offered/Needs additional education.   Clinical Observations/Feedback:  Pt started work on the activity with her group.  Pt left early with doctor and did not return.    Victorino Sparrow, LRT/CTRS         Victorino Sparrow A 10/12/2017 12:37 PM

## 2017-10-12 NOTE — Progress Notes (Signed)
Patient ID: [name] [name], female   DOB: [birth year], 46 y.o.   MRN: [id number]  1:1 notes  10/12/2017 @ 2130  D: Patient in dayroom watching TV quietly. Pt mood and affect appears depressed and flat. Pt is slow to respond but answered all of writer's questions. Pt reports she had a good day. Pt reports increase anxiety and rated it as 8 on a 0-10 scale. Pt denies SI/HI/AVH and pain. A: 1:1 observation for safety. Emotional support given as needed. Medication administered as prescribed. R: Patient is safe. Sitter by pt's side. 1:1 continues

## 2017-10-12 NOTE — Progress Notes (Signed)
Recreation Therapy Notes  INPATIENT RECREATION THERAPY ASSESSMENT  Patient Details Name: [name] [name] MRN: [id number] DOB: 1971/10/29 Today's Date: 10/12/2017  Patient Stressors: Family, Other (Comment) (Credit card bill, lives alone, son about to be deployed)  Pt stated she was here for wanting to hurt herself.  Coping Skills:   Avoidance, Self-Injury, Exercise, Talking  Personal Challenges: Decision-Making, Expressing Yourself, Relationships, Self-Esteem/Confidence, Social Interaction, Stress Management  Leisure Interests (2+):  Individual - TV, Individual - Other (Comment) (Walk and spend time with dog)  Awareness of Community Resources:  Yes  Community Resources:  [place], Other (Comment) [place])  Current Use: Yes  Patient Strengths:  Nice person, caring  Patient Identified Areas of Improvement:  "Get a grip with [place]"  Current Recreation Participation:  2-3 times a week  Patient Goal for Hospitalization:  "Get stable on medication"  [place] of Residence:  [place] of Residence:  [place]  Current [place] (including self-harm):  No  Current HI:  No  Consent to Intern Participation: N/A   Victorino Sparrow, LRT/CTRS  Victorino Sparrow A 10/12/2017, 3:30 PM

## 2017-10-12 NOTE — Progress Notes (Signed)
Patient ID: [name] [name], female   DOB: May 14, 1971, 46 y.o.   MRN: [id number]  1:1 Nursing Note-  Patient is currently sitting in the dayroom eating her dinner. She reports that she is doing, "okay" right now. She is calm in demeanor at this time. Her respirations are even and unlabored. Q15 minute safety checks and 1:1 observation is continued.

## 2017-10-12 NOTE — Progress Notes (Addendum)
Patient ID: [name] [name], female   DOB: Oct 15, 1971, 46 y.o.   MRN: [id number]  1:1 Nursing Note-   Patient is currently standing at the medication window speaking with this Probation officer. She presents with flat affect and depressed mood. She reports passive SI, she is not able to contract for safety at this time. She reports that she also hears a voice, she states is named "[place]." She denies HI and visual hallucinations. She presents with slow body movement and takes a while to answer questions. She is calm at this time. Her respirations are even and unlabored at this time. Q15 minute safety checks and 1:1 observation orders are continued.   Patient reports on her daily inventory sheet that she slept fair last night, her appetite is fair, her energy level is normal, and her concentration is poor. She rates her depression level 9/10, her hopelessness level 9/10, and her anxiety level 8/10.

## 2017-10-13 DIAGNOSIS — F419 Anxiety disorder, unspecified: Secondary | ICD-10-CM

## 2017-10-13 DIAGNOSIS — F39 Unspecified mood [affective] disorder: Secondary | ICD-10-CM

## 2017-10-13 DIAGNOSIS — Z599 Problem related to housing and economic circumstances, unspecified: Secondary | ICD-10-CM

## 2017-10-13 LAB — FOLATE: FOLATE: 22.8 ng/mL (ref >5.9)

## 2017-10-13 LAB — GLUCOSE, CAPILLARY: Glucose-Capillary: 89 mg/dL (ref 65–99)

## 2017-10-13 LAB — VITAMIN B12: VITAMIN B 12: 310 pg/mL (ref 180–914)

## 2017-10-13 LAB — PROLACTIN: Prolactin: 108.5 ng/mL — ABNORMAL HIGH (ref 4.8–23.3)
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
## 2017-10-13 NOTE — Progress Notes (Signed)
[facility] 1:1 Observation Documentation  For the first (8) hours following discontinuation of 1:1 precautions, a progress note entry by nursing staff should be documented at least every 2 hours, reflecting the patient's behavior, condition, mood, and conversation.  Use the progress notes for additional entries.  Time 1:1 discontinued:  2:30 pm   Patient's Behavior:  Patient denies SI, HI, and AVH.  Patient has been able to contract for safety over the last 24 hours.   Patient's Condition:  Patient reports increase in mood and a lessening of depressive symptoms.  Patient has been compliant with medications and attending groups.   Patient's Conversation:  Patient denies SI, HI and AVH   [name] [name] 10/13/2017

## 2017-10-13 NOTE — Progress Notes (Signed)
Patient has been free of harm to self/others.  Patient denies SI, HI and AVH at the current time. Patient 1:1 staff in close proximity to patient and patient's environment is secure

## 2017-10-13 NOTE — Progress Notes (Signed)
Recreation Therapy Notes  Date: 10/13/17 Time: 1000 Location: 500 Hall Dayroom  Group Topic: Wellness  Goal Area(s) Addresses:  Patient will define components of whole wellness. Patient will verbalize benefit of whole wellness.  Behavioral Response: Engaged  Intervention:  2 Decks of Cards  Activity: Deck of Chance.  Patients were given 2 cards from one deck of cards.  LRT would pull a card from another deck of cards.  Whatever card LRT pulled, patients with the corresponding card would have to complete the associated exercise.  Education: Wellness, Dentist.   Education Outcome: Acknowledges education/In group clarification offered/Needs additional education.   Clinical Observations/Feedback: Pt was quiet, flat but active.  Pt completed the exercises.   Victorino Sparrow, LRT/CTRS         Ria Comment, Milas Schappell A 10/13/2017 12:12 PM

## 2017-10-13 NOTE — Progress Notes (Signed)
Inpatient Diabetes Program Recommendations  AACE/ADA: New Consensus Statement on Inpatient Glycemic Control (2015)  Target Ranges:  Prepandial:   less than 140 mg/dL      Peak postprandial:   less than 180 mg/dL (1-2 hours)      Critically ill patients:  140 - 180 mg/dL   Lab Results  Component Value Date   GLUCAP 89 10/13/2017   HGBA1C 5.0 10/12/2017    Review of Glycemic Control  Diabetes history: DM2 Outpatient Diabetes medications: metformin 500 mg bid Current orders for Inpatient glycemic control: metformin 500 mg bid  HgbA1C of 5.0% indicates good glycemic control. Needs correction insulin while in hospital while taking OHA.  Inpatient Diabetes Program Recommendations:   Add Novolog 0-9 units tidwc.  Continue to follow.  Thank you. Lorenda Peck, RD, LDN, CDE Inpatient Diabetes Coordinator [phone]

## 2017-10-13 NOTE — Progress Notes (Signed)
Patient ID: [name] [name], female   DOB: [birth year], 47 y.o.   MRN: [id number]   1:1 notes  10/18//2018 @ 0130  D: Patient in bed sleeping. Respiration regular and unlabored. No sign of distress noted at this time A: 1:1 observation for safety R: Patient remains asleep. Sitter at bedside. 1:1 continues

## 2017-10-13 NOTE — Progress Notes (Signed)
[facility] 1:1 Observation Documentation  For the first (8) hours following discontinuation of 1:1 precautions, a progress note entry by nursing staff should be documented at least every 2 hours, reflecting the patient's behavior, condition, mood, and conversation.  Use the progress notes for additional entries.  Time 1:1 discontinued:  2:30 pm   Patient's Behavior:  Patient denies SI, HI, and AVH.  Patient has been able to contract for safety over the last 24 hours.   Patient's Condition:  Patient reports increase in mood and a lessening of depressive symptoms.  Patient has been compliant with medications and attending groups.   Patient's Conversation:  Patient denies SI, HI and AVH   [name] 10/13/2017, 02:30pm

## 2017-10-13 NOTE — Progress Notes (Signed)
Patient ID: [name] [name], female   DOB: 09/28/1971, 46 y.o.   MRN: [id number]   1:1 notes  10/18//2018 @ 0530  D: Patient in bed sleeping. Respiration regular and unlabored. No sign of distress noted at this time A: 1:1 observation for safety R: Patient remains asleep. Sitter at bedside. 1:1 continues

## 2017-10-13 NOTE — Progress Notes (Signed)
Adult Psychoeducational Group Note  Date:  10/13/2017 Time:  9:02 PM  Group Topic/Focus:  Wrap-Up Group:   The focus of this group is to help patients review their daily goal of treatment and discuss progress on daily workbooks.  Participation Level:  Active  Participation Quality:  Appropriate  Affect:  Appropriate  Cognitive:  Alert  Insight: Appropriate  Engagement in Group:  Engaged  Modes of Intervention:  Discussion  Additional Comments:  Patient stated having a good day. Patient's goal for today was to take all meds. Patient met goal.   [name] [name] [name] 10/13/2017, 9:02 PM

## 2017-10-13 NOTE — [facility] Group Notes (Signed)
LCSW Group Therapy 10/13/2017 1:15pm  Type of Therapy and Topic:  Group Therapy:  Change and Accountability  Participation Level:  Active  Description of Group In this group, patients discussed power and accountability for change.  The group identified the challenges related to accountability and the difficulty of accepting the outcomes of negative behaviors.  Patients were encouraged to openly discuss a challenge/change they could take responsibility for.  Patients discussed the use of "change talk" and positive thinking as ways to support achievement of personal goals.  The group discussed ways to give support and empowerment to peers.  Therapeutic Goals: 1. Patients will state the relationship between personal power and accountability in the change process 2. Patients will identify the positive and negative consequences of a personal choice they have made 3. Patients will identify one challenge/choice they will take responsibility for making 4. Patients will discuss the role of "change talk" and the impact of positive thinking as it supports successful personal change 5. Patients will verbalize support and affirmation of change efforts in peers  Summary of Patient Progress:  [name] [name] attended group and was there the entire time.  She was an active participant. She shared that the one thing she knows about her recovery is that there is help and that she can beat [place] (the voice in her head).    Therapeutic Modalities Solution Focused Brief Therapy Motivational Interviewing Cognitive Behavioral Therapy  Darleen Crocker, Student-Social Work 10/13/2017 1:10 PM

## 2017-10-13 NOTE — Progress Notes (Signed)
[facility] 1:1 Observation Documentation  For the first (8) hours following discontinuation of 1:1 precautions, a progress note entry by nursing staff should be documented at least every 2 hours, reflecting the patient's behavior, condition, mood, and conversation.  Use the progress notes for additional entries.  Time 1:1 discontinued:  2:30 pm   Patient's Behavior:  Patient denies SI, HI, and AVH.  Patient has been able to contract for safety over the last 24 hours.   Patient's Condition:  Patient reports increase in mood and a lessening of depressive symptoms.  Patient has been compliant with medications and attending groups.   Patient's Conversation:  Patient denies SI, HI and AVH   [name] 10/13/2017

## 2017-10-13 NOTE — Progress Notes (Signed)
Nursing Progress Note: 7p-7a D: Pt currently presents with a depressed/anxious/guarded affect and behavior. Pt states "I am not hearing [place] (auditory hallucinations) anymore. I feel better." Interacting minimally with the milieu. Pt reports fair sleep during the previous night with current medication regimen. Pt did attend wrap-up group.  A: Pt provided with medications per providers orders. Pt's labs and vitals were monitored throughout the night. Pt supported emotionally and encouraged to express concerns and questions. Pt educated on medications.  R: Pt's safety ensured with 15 minute and environmental checks. Pt currently denies SI, HI, and AVH. Pt verbally contracts to seek staff if SI,HI, or AVH occurs and to consult with staff before acting on any harmful thoughts. Will continue to monitor.

## 2017-10-13 NOTE — Progress Notes (Signed)
Patient has been free of harm to self/others.  Patient denies SI, HI and AVH at the current time. Patient 1:1 staff in close proximity to patient and patient's environment is secure.

## 2017-10-14 LAB — GLUCOSE, CAPILLARY: Glucose-Capillary: 89 mg/dL (ref 65–99)

## 2017-10-14 LAB — VITAMIN D 25 HYDROXY (VIT D DEFICIENCY, FRACTURES): VIT D 25 HYDROXY: 33.8 ng/mL (ref 30.0–100.0)

## 2017-10-14 MED ORDER — PALIPERIDONE PALMITATE 156 MG/ML IM SUSP
156.0000 mg | Freq: Once | INTRAMUSCULAR | Status: DC
  Filled 2017-10-14: qty 1

## 2017-10-14 NOTE — Progress Notes (Signed)
Patient denies SI, Hi and AVH.  Patient has reported some anxiety and depressed mood but has been able to contract for safety.   Assess patient for safety, offer medications as prescribed, engage patient in 1:1 staff talks.   Patient able to contract for safety, continue to monitor as planned.
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
## 2017-10-14 NOTE — [facility] Group Notes (Signed)
LCSW Group Therapy Note   10/14/2017 1:15pm   Type of Therapy and Topic:  Group Therapy:  Positive Affirmations   Participation Level:  Minimal  Description of Group: This group addressed positive affirmation toward self and others. Patients went around the room and identified two positive things about themselves and two positive things about a peer in the room. Patients reflected on how it felt to share something positive with others, to identify positive things about themselves, and to hear positive things from others. Patients were encouraged to have a daily reflection of positive characteristics or circumstances.  Therapeutic Goals 1. Patient will verbalize two of their positive qualities 2. Patient will demonstrate empathy for others by stating two positive qualities about a peer in the group 3. Patient will verbalize their feelings when voicing positive self affirmations and when voicing positive affirmations of others 4. Patients will discuss the potential positive impact on their wellness/recovery of focusing on positive traits of self and others. Summary of Patient Progress:  [name] [name] attended group but was pulled out by the doctor for a short period of time.  She shared that courage for her was learning to cope in a new way and that she is trying to be positive about it.  She also shared that she feels isolated even when she is around a group of people.  She was more talkative today than in previous days but still appeared depressed.  When she returned from seeing the doctor she did not choose a picture of what she thought courage looked like.  She shared that for her courage is beating the voice in her head.     Therapeutic Modalities Cognitive Behavioral Therapy Motivational Interviewing  [name] [name], Student-Social Work 10/14/2017 1:20 PM

## 2017-10-14 NOTE — Progress Notes (Signed)
Recreation Therapy Notes  Date: 10/14/17 Time: 1000 Location: 500 Hall Dayroom  Group Topic: Leisure Education  Goal Area(s) Addresses:  Patient will identify positive leisure activities.  Patient will identify one positive benefit of participation in leisure activities.   Intervention: Chairs, small beach ball  Activity: Keep It Going Volleyball.  LRT seated patients in a circle.  Patients were to toss the ball back and fourth to each other without letting the ball come to a stop.  Patients could bounce the ball off the floor but the wall was to remain moving at all times.  LRT would count the number of hits on the ball.  If the ball stopped the count would start over.  Education:  Leisure Education, Dentist  Education Outcome: Acknowledges education/In group clarification offered/Needs additional education  Clinical Observations/Feedback: Pt did not attend group.   Victorino Sparrow, LRT/CTRS         Victorino Sparrow A 10/14/2017 12:29 PM

## 2017-10-15 DIAGNOSIS — R443 Hallucinations, unspecified: Secondary | ICD-10-CM

## 2017-10-15 MED ORDER — PALIPERIDONE ER 6 MG PO TB24
6.0000 mg | ORAL_TABLET | Freq: Every day | ORAL | Status: DC
  Administered 2017-10-15 – 2017-10-17 (×3): 6 mg via ORAL
  Filled 2017-10-15 (×4): qty 1

## 2017-10-15 NOTE — Progress Notes (Signed)
Patient ID: [name] [name], female   DOB: Jun 27, 1971, 46 y.o.   MRN: [id number]    D: Pt has been very flat and depressed on the unit today. Pt reported that she felt much better today, she also reported that she has seen many bad days in the past. Pt reported that her depression was a 7, her hopelessness was a 7, and her anxiety was a 8. Pt reported that her goal for today was to get stable on medication. Pt reported being negative SI/HI, no AH/VH noted. A: 15 min checks continued for patient safety. R: Pt safety maintained.

## 2017-10-15 NOTE — Progress Notes (Signed)
D: Patient seen on dayroom watching TV. Stated her day was "ok." Denies pain, SI/HI, AH/VH at this time. Reports mild anxiety. Cooperative and compliant with med/treatment. No behavior issues noted.   A: Staff offered support and encouragement as needed. Due med given as ordered. Routine safety checks maintained. Will continue to monitor patient.  R: Patient remains safe on unit.

## 2017-10-15 NOTE — [facility] Group Notes (Signed)
  [facility] LCSW Group Therapy Note  Date/Time:  10/15/2017 11:15AM-12:00PM  Type of Therapy and Topic:  Group Therapy:  Feelings About Hospitalization  Participation Level:  Minimal   Description of Group This process group involved patients discussing their feelings related to being hospitalized, as well as the benefits they see to being in the hospital.  These feelings and benefits were itemized.  The group then brainstormed specific ways in which they could seek those same benefits when they discharge and return home.  Therapeutic Goals 1. Patient will identify and describe positive and negative feelings related to hospitalization 2. Patient will verbalize benefits of hospitalization to themselves personally 3. Patients will brainstorm together ways they can obtain similar benefits in the outpatient setting, identify barriers to wellness and possible solutions  Summary of Patient Progress:  The patient expressed her primary feelings about being hospitalized are that it is giving her time to "think things through."  She spoke very little in group, even when called on directly.  Her affect remained muted and she was not at all engaged.  Therapeutic Modalities Cognitive Behavioral Therapy Motivational Interviewing    Selmer Dominion, LCSW 10/15/2017, 8:57 AM
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
## 2017-10-16 NOTE — Progress Notes (Signed)
D: Patient visible on dayroom. Appear flat and depressed. States her day was "fair". Denies pain, SI/HI, AH/VH at this time. Endorses depression of 4/10 and mild anxiety. Patient acknowledges the increase in [place]. Patient states  "I think my mood will get better with that".  A: Staff offered support, education and encouragement as needed. Due med given as ordered. Routine safety checks maintained. Will continue to monitor patient.  R: Patient receptive to treatment.

## 2017-10-16 NOTE — Progress Notes (Signed)
Patient ID: [name] [name], female   DOB: 09-Jul-1971, 46 y.o.   MRN: [id number]  DAR: Pt. Denies SI/HI and A/V Hallucinations. She reports that her sleep last night was poor, her appetite is fair, her energy level is normal, and her concentration is good. She rates her depression level 6/10, her hopelessness level 5/10, and her anxiety level 7/10. Support and encouragement provided to the patient. Scheduled metformin refused by patient as she reports that she no longer takes this medication. She received PRN Tylenol this afternoon for some abdominal cramping and heat packs intermittently to soothe the area. Patient presents with watchful eye contact. Patient appears preoccupied and at times slow to respond to questions. She is soft in speech however logical. She reports that at this time her goal is to work on her post discharge living arrangements. She is seen in the milieu, mostly sitting in the dayroom. Q15 minute checks are maintained for safety.

## 2017-10-16 NOTE — Progress Notes (Signed)
[facility] Group Notes:  (Nursing/MHT/Case Management/Adjunct)  Date:  10/16/2017  Time:  9:16 PM  Type of Therapy:  Psychoeducational Skills  Participation Level:  Minimal  Participation Quality:  Attentive  Affect:  Flat  Cognitive:  Lacking  Insight:  Limited  Engagement in Group:  Resistant  Modes of Intervention:  Education  Summary of Progress/Problems: The patient had little to share except to say that she went outside for fresh air today. As for the theme of the day, her support system will be comprised of her daughter and son.   [name] 10/16/2017, 9:16 PM

## 2017-10-16 NOTE — [facility] Group Notes (Signed)
[facility] LCSW Group Therapy Note  Date/Time:  10/16/2017  11:00AM-12:00PM  Type of Therapy and Topic:  Group Therapy:  Music and Mood  Participation Level:  Active   Description of Group: In this process group, members listened to a variety of genres of music and identified that different types of music evoke different responses.  Patients were encouraged to identify music that was soothing for them and music that was energizing for them.  Patients discussed how this knowledge can help with wellness and recovery in various ways including managing depression and anxiety as well as encouraging healthy sleep habits.    Therapeutic Goals: 1. Patients will explore the impact of different varieties of music on mood 2. Patients will verbalize the thoughts they have when listening to different types of music 3. Patients will identify music that is soothing to them as well as music that is energizing to them 4. Patients will discuss how to use this knowledge to assist in maintaining wellness and recovery 5. Patients will explore the use of music as a coping skill  Summary of Patient Progress:  At the beginning of group, patient expressed she felt anxious and at the end of group said she felt "pretty relaxed" and energized.  Therapeutic Modalities: Solution Focused Brief Therapy Motivational Interviewing Activity   Selmer Dominion, LCSW 10/16/2017 5:40 PM

## 2017-10-16 NOTE — Progress Notes (Signed)
D: Pt denies SI/HI/AVH. Pt is pleasant and cooperative. Pt presents depressed, pt keeps to herself. Pt stated she wanted to talk with a social worker about her living situation. Pt expressed she was better since coming in  A: Pt was offered support and encouragement. Pt was given scheduled medications. Pt was encourage to attend groups. Q 15 minute checks were done for safety.   R:Pt attends groups  Pt is taking medication. Pt has no complaints.Pt receptive to treatment and safety maintained on unit.
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
## 2017-10-16 NOTE — Plan of Care (Signed)
Problem: Safety: Goal: Periods of time without injury will increase Outcome: Progressing Pt safe on the unit at this time   

## 2017-10-17 MED ORDER — PALIPERIDONE PALMITATE 156 MG/ML IM SUSP
156.0000 mg | Freq: Once | INTRAMUSCULAR | Status: AC
  Administered 2017-10-17: 156 mg via INTRAMUSCULAR

## 2017-10-17 NOTE — Tx Team (Signed)
Interdisciplinary Treatment and Diagnostic Plan Update  10/17/2017 Time of Session: 3:32 PM  [name] [name] MRN: [id number]  Principal Diagnosis: :MDD (major depressive disorder), recurrent, severe, with psychosis ([place])  Secondary Diagnoses: Principal Problem:   MDD (major depressive disorder), recurrent, severe, with psychosis ([place])   Current Medications:  Current Facility-Administered Medications  Medication Dose Route Frequency Provider Last Rate Last Dose  . acetaminophen (TYLENOL) tablet 650 mg  650 mg Oral Q6H PRN Laverle Hobby, PA-C   650 mg at 10/16/17 1812  . alum & mag hydroxide-simeth (MAALOX/MYLANTA) 200-200-20 MG/5ML suspension 30 mL  30 mL Oral Q4H PRN Patriciaann Clan E, PA-C      . lithium carbonate capsule 600 mg  600 mg Oral QHS Patriciaann Clan E, PA-C   600 mg at 10/16/17 2109  . LORazepam (ATIVAN) tablet 0.5 mg  0.5 mg Oral Q6H PRN Cobos, Myer Peer, MD   0.5 mg at 10/16/17 2109  . magnesium hydroxide (MILK OF MAGNESIA) suspension 30 mL  30 mL Oral Daily PRN Laverle Hobby, PA-C      . metFORMIN (GLUCOPHAGE) tablet 500 mg  500 mg Oral BID WC Simon, Spencer E, PA-C      . metoprolol tartrate (LOPRESSOR) tablet 25 mg  25 mg Oral QHS Patriciaann Clan E, PA-C   25 mg at 10/15/17 2107  . mirtazapine (REMERON) tablet 7.5 mg  7.5 mg Oral QHS Cobos, Myer Peer, MD   7.5 mg at 10/16/17 2109  . paliperidone (INVEGA) 24 hr tablet 6 mg  6 mg Oral QHS Arfeen, Arlyce Harman, MD   6 mg at 10/16/17 2109    PTA Medications: Prescriptions Prior to Admission  Medication Sig Dispense Refill Last Dose  . hydrOXYzine (VISTARIL) 25 MG capsule Take 25 mg by mouth 3 (three) times daily.   10/11/2017  . LATUDA 20 MG TABS tablet Take 20 mg by mouth daily with breakfast.  0 10/11/2017  . lithium carbonate (ESKALITH) 450 MG CR tablet Take 450 mg by mouth at bedtime.  0 10/11/2017  . Multiple Vitamin (MULTIVITAMIN WITH MINERALS) TABS tablet Take 1 tablet by mouth daily.   Past Week  . paliperidone  (INVEGA SUSTENNA) 156 MG/ML SUSP injection Inject 156 mg into the muscle every 30 (thirty) days. Last injection 09/14/17 at [facility], [facility]   09/14/2017  . traZODone (DESYREL) 100 MG tablet Take 200 mg by mouth at bedtime.    10/11/2017    Treatment Modalities: Medication Management, Group therapy, Case management,  1 to 1 session with clinician, Psychoeducation, Recreational therapy.  Patient Stressors: Financial difficulties Marital or family conflict Occupational concerns  Patient Strengths: Average or above average intelligence Capable of independent living General fund of knowledge Motivation for treatment/growth   Physician Treatment Plan for Primary Diagnosis:  MDD (major depressive disorder), recurrent, severe, with psychosis ([place])  Long Term Goal(s): Improvement in symptoms so as ready for discharge  Short Term Goals: Ability to identify changes in lifestyle to reduce recurrence of condition will improve Ability to identify triggers associated with substance abuse/mental health issues will improve Ability to verbalize feelings will improve Ability to disclose and discuss suicidal ideas Ability to demonstrate self-control will improve Ability to identify and develop effective coping behaviors will improve Ability to maintain clinical measurements within normal limits will improve  Medication Management: Evaluate patient's response, side effects, and tolerance of medication regimen.  Therapeutic Interventions: 1 to 1 sessions, Unit Group sessions and Medication administration.  Evaluation of Outcomes: Adequate for Discharge  Physician Treatment Plan for Secondary Diagnosis: Principal Problem:   MDD (major depressive disorder), recurrent, severe, with psychosis ([place])  Long Term Goal(s): Improvement in symptoms so as ready for discharge  Short Term Goals: Ability to identify changes in lifestyle to reduce recurrence of condition will improve Ability to identify triggers  associated with substance abuse/mental health issues will improve Ability to verbalize feelings will improve Ability to disclose and discuss suicidal ideas Ability to demonstrate self-control will improve Ability to identify and develop effective coping behaviors will improve Ability to maintain clinical measurements within normal limits will improve  Medication Management: Evaluate patient's response, side effects, and tolerance of medication regimen.  Therapeutic Interventions: 1 to 1 sessions, Unit Group sessions and Medication administration.  Evaluation of Outcomes: Adequate for Discharge   RN Treatment Plan for Primary Diagnosis: MDD (major depressive disorder), recurrent, severe, with psychosis ([place])  Long Term Goal(s): Knowledge of disease and therapeutic regimen to maintain health will improve  Short Term Goals: Ability to remain free from injury will improve, Ability to verbalize feelings will improve, Ability to identify and develop effective coping behaviors will improve and Compliance with prescribed medications will improve  Medication Management: RN will administer medications as ordered by provider, will assess and evaluate patient's response and provide education to patient for prescribed medication. RN will report any adverse and/or side effects to prescribing provider.  Therapeutic Interventions: 1 on 1 counseling sessions, Psychoeducation, Medication administration, Evaluate responses to treatment, Monitor vital signs and CBGs as ordered, Perform/monitor CIWA, COWS, AIMS and Fall Risk screenings as ordered, Perform wound care treatments as ordered.  Evaluation of Outcomes: Adequate for Discharge   LCSW Treatment Plan for Primary Diagnosis: MDD (major depressive disorder), recurrent, severe, with psychosis ([place])  Long Term Goal(s): Safe transition to appropriate next level of care at discharge, Engage patient in therapeutic group addressing interpersonal  concerns.  Short Term Goals: Engage patient in aftercare planning with referrals and resources, Facilitate acceptance of mental health diagnosis and concerns, Identify triggers associated with mental health/substance abuse issues and Increase skills for wellness and recovery  Therapeutic Interventions: Assess for all discharge needs, 1 to 1 time with Social worker, Explore available resources and support systems, Assess for adequacy in community support network, Educate family and significant other(s) on suicide prevention, Complete Psychosocial Assessment, Interpersonal group therapy.  Evaluation of Outcomes: Adequate for Discharge   Progress in Treatment: Attending groups: No Participating in groups: No Taking medication as prescribed: Yes Toleration of medication: Yes, no side effects reported at this time Family/Significant other contact made: No Patient understands diagnosis: No, limited insight Discussing patient identified problems/goals with staff: Yes Medical problems stabilized or resolved: Yes Denies suicidal/homicidal ideation: Yes Issues/concerns per patient self-inventory: None Other: N/A  New problem(s) identified: None identified at this time.   New Short Term/Long Term Goal(s): Pt wants help with "Controling the voice in my head ([place])".   Discharge Plan or Barriers: Pt will return to her home in [facility], follow up [facility] and with her current therapist   Reason for Continuation of Hospitalization:   Medication stabilization    Estimated Length of Stay: Likely d/c tomorrow  Attendees: Patient: [name] [name]  10/17/2017  3:32 PM  Physician: Maris Berger, MD 10/17/2017  3:32 PM  Nursing: Jonette Mate, RN 10/17/2017  3:32 PM  RN Care Manager: Lars Pinks, RN 10/17/2017  3:32 PM  Social Worker: Ripley Fraise, [place]; Verdis Frederickson, Social Work Intern 10/17/2017  3:32 PM  Recreational Therapist: Victorino Sparrow, LRT  10/17/2017  3:32 PM   Other: Norberto Sorenson, [facility] 10/17/2017  3:32 PM  Other:  10/17/2017  3:32 PM  Other: 10/17/2017  3:32 PM    Scribe for Treatment Team: Trish Mage, LCSW 10/17/2017 3:32 PM

## 2017-10-17 NOTE — Plan of Care (Signed)
Problem: Safety: Goal: Periods of time without injury will increase Outcome: Progressing Patient is safe and free from injury.  Routine safety checks maintained every 15 minutes.   

## 2017-10-17 NOTE — Progress Notes (Signed)
Patient ID: [name] [name], female   DOB: 1971-07-10, 46 y.o.   MRN: [id number] PER STATE REGULATIONS 482.30  THIS CHART WAS REVIEWED FOR MEDICAL NECESSITY WITH RESPECT TO THE PATIENT'S ADMISSION/ DURATION OF STAY.  NEXT REVIEW DATE: 10/19/2017  Chauncy Lean, RN, BSN CASE MANAGER

## 2017-10-17 NOTE — Progress Notes (Signed)
Recreation Therapy Notes  Date: 10/17/17 Time: 1000 Location: 500 Hall Dayroom  Group Topic: Coping Skills  Goal Area(s) Addresses:  Patients will be able to identify positive coping skills. Patients will be able to identify the benefits of using coping skills post d/c.  Behavioral Response: Engaged  Intervention: Psychologist, forensic, pencils  Activity: Building surveyor.  Patients were given a picture of a blank spider web.  Patients were to identify the things that have gotten them stuck and write them inside the web.  Patients were to then identify at least two coping skills for each situation they identified.  Education: Radiographer, therapeutic, Dentist.   Education Outcome: Acknowledges understanding/In group clarification offered/Needs additional education.   Clinical Observations/Feedback: Pt identified some of the things she has been dealing with as hearing the voice she calls "[place]", son being deployed for the next year, possibly losing her son and living alone.  Pt stated her coping skills were witting letters to her son; not listen to the voice or keep busy; and live with her daughter in law or in assisted living.  Pt stated not using her coping skills makes her feel "hopeless".  Pt also expressed using her coping skills post discharge would help her "not feel depressed".   Victorino Sparrow, LRT/CTRS         Victorino Sparrow A 10/17/2017 11:55 AM

## 2017-10-17 NOTE — Progress Notes (Signed)
Adult Psychoeducational Group Note  Date:  10/17/2017 Time:  8:50 PM  Group Topic/Focus:  Wrap-Up Group:   The focus of this group is to help patients review their daily goal of treatment and discuss progress on daily workbooks.  Participation Level:  Active  Participation Quality:  Appropriate  Affect:  Appropriate  Cognitive:  Appropriate  Insight: Appropriate  Engagement in Group:  Engaged  Modes of Intervention:  Discussion  Additional Comments:  The patient expressed that she attended all groups.The patient also said that she rates today a 7 and reach her goals.  Nash Shearer 10/17/2017, 8:50 PM

## 2017-10-17 NOTE — [facility] Group Notes (Signed)
LCSW Group Therapy Note   10/17/2017 1:15pm   Type of Therapy and Topic:  Group Therapy:  Overcoming Obstacles   Participation Level:  Active   Description of Group:    In this group patients will be encouraged to explore what they see as obstacles to their own wellness and recovery. They will be guided to discuss their thoughts, feelings, and behaviors related to these obstacles. The group will process together ways to cope with barriers, with attention given to specific choices patients can make. Each patient will be challenged to identify changes they are motivated to make in order to overcome their obstacles. This group will be process-oriented, with patients participating in exploration of their own experiences as well as giving and receiving support and challenge from other group members.   Therapeutic Goals: 1. Patient will identify personal and current obstacles as they relate to admission. 2. Patient will identify barriers that currently interfere with their wellness or overcoming obstacles.  3. Patient will identify feelings, thought process and behaviors related to these barriers. 4. Patient will identify two changes they are willing to make to overcome these obstacles:      Summary of Patient Progress  Stayed the entire time, engaged throughout.  Stated her biggest obstacle is her loneliness since her daughter moved out at the beginning of the year. Initially stated she was thinking about living in an ALF, but then went on to say that her family has come up with a plan.  Since her son will be deployed soon, she plans to stay with her daughter in law with whom she has a very good relationship.  Mood good, upbeat.  Appears relieved that family has stepped up to help her, and that she will not have to return home by herself, nor go into ALF immediately.   Therapeutic Modalities:   Cognitive Behavioral Therapy Solution Focused Therapy Motivational Interviewing Relapse Prevention  Therapy  Trish Mage, LCSW 10/17/2017 3:35 PM

## 2017-10-17 NOTE — Progress Notes (Signed)
DAR NOTE: Patient presents with anxious affect and depressed mood.  Denies suicidal thoughts, pain, auditory and visual hallucinations.  Described energy level as normal and concentration as good.  Rates depression at 6, hopelessness at 4, and anxiety at 4.  Maintained on routine safety checks.  Medications given as prescribed.  Support and encouragement offered as needed.  Attended group and participated.  States goal for today is "getting information on assisted living."  Patient visible in the dayroom interacting with peers and staff.  Received Invega sustenance.  No adverse reaction noted.
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
## 2017-10-18 MED ORDER — LITHIUM CARBONATE 600 MG PO CAPS: 600.0000 mg | ORAL_CAPSULE | Freq: Every day | ORAL | 0 refills | Status: DC

## 2017-10-18 MED ORDER — METOPROLOL TARTRATE 25 MG PO TABS: 25.0000 mg | ORAL_TABLET | Freq: Every day | ORAL | 0 refills | Status: DC

## 2017-10-18 MED ORDER — METFORMIN HCL 500 MG PO TABS: 500.0000 mg | ORAL_TABLET | Freq: Two times a day (BID) | ORAL | 0 refills | Status: DC

## 2017-10-18 MED ORDER — LORAZEPAM 0.5 MG PO TABS: 0.5000 mg | ORAL_TABLET | Freq: Four times a day (QID) | ORAL | 0 refills | Status: DC | PRN

## 2017-10-18 MED ORDER — MIRTAZAPINE 7.5 MG PO TABS: 7.5000 mg | ORAL_TABLET | Freq: Every day | ORAL | 0 refills | Status: DC

## 2017-10-18 MED ORDER — PALIPERIDONE ER 6 MG PO TB24: 6.0000 mg | ORAL_TABLET | Freq: Every day | ORAL | 0 refills | Status: DC

## 2017-10-18 NOTE — [facility] Suicide Risk Assessment (Signed)
[facility] Discharge Suicide Risk Assessment   Principal Problem: MDD (major depressive disorder), recurrent, severe, with psychosis ([name] [name]) Discharge Diagnoses:  Patient Active Problem List   Diagnosis Date Noted  . MDD (major depressive disorder), recurrent, severe, with psychosis ([name] [name]) [F33.3] 10/11/2017  . Clostridium difficile colitis [A04.72] 05/29/2017  . Abdominal pain [R10.9] 05/28/2017  . Morbid obesity ([name] [name]) [E66.01] 05/24/2017  . Pleuritis [R09.1] 05/19/2017  . Pleural effusion [J90] 05/19/2017  . Schizoaffective disorder, depressive type ([name] [name]) [F25.1] 02/04/2017  . Gastroesophageal reflux [K21.9] 01/24/2017  . Obesity, Class II, BMI 35-39.9 [E66.9] 01/24/2017  . Suicidal behavior with attempted self-injury ([name] [name]) [T14.91XA] 01/24/2017  . HTN (hypertension) [I10] 03/30/2016  . Serotonin syndrome [G25.79] 03/30/2016  . Elevated blood pressure [IMO0001] 03/30/2016  . Leukocytosis [D72.829] 03/30/2016  . Precordial chest pain [R07.2] 03/30/2016  . Incisional hernia [K43.2] 12/24/2015  . Hyperprolactinemia ([name] [name]) [E22.1] 09/11/2015  . Schizoaffective disorder, bipolar type ([name] [name]) [F25.0] 09/10/2015  . Hx of borderline personality disorder [Z86.59] 09/10/2015  . Post traumatic stress disorder (PTSD) [F43.10] 09/10/2015  . Attempted suicide ([name] [name]) [T14.91XA] 09/05/2015  . Prolonged Q-T interval on ECG [R94.31]   . Weight gain [R63.5] 03/06/2015  . [place] [R23.2] 03/06/2015  . Severe episode of recurrent major depressive disorder ([name] [name]) [F33.2] 08/23/2014  . Iron deficiency anemia [D50.9] 11/15/2013  . Alcohol dependence ([name] [name]) [F10.20] 06/02/2013  . Liver mass [R16.0] 05/28/2013  . Medication side effects [T88.7XXA] 05/07/2013  . Headache [R51] 04/03/2013  . Hx MRSA infection [Z86.14] 04/03/2013  . Postop check [Z09] 03/13/2013  . Postoperative wound infection [T81.49XA] 02/23/2013  . History of colostomy [IMO0002] 02/16/2013  . Borderline personality disorder ([name] [name]) [F60.3]  08/16/2012    Class: Chronic  . Acute blood loss anemia [D62] 08/01/2012  . Major depressive disorder [F32.9] 08/01/2012  . Kidney stone [N20.0] 03/31/2012  . Hydronephrosis [N13.30] 03/31/2012    Total Time spent with patient: 30 minutes  Musculoskeletal: Strength & Muscle Tone: within normal limits Gait & Station: normal Patient leans: N/A  Psychiatric Specialty Exam: Review of Systems  Constitutional: Negative for chills and fever.  Respiratory: Negative for cough and shortness of breath.   Cardiovascular: Negative for chest pain.  Gastrointestinal: Negative for heartburn, nausea and vomiting.  Psychiatric/Behavioral: Positive for hallucinations. Negative for suicidal ideas. The patient is nervous/anxious.     Blood pressure (!) 94/53, pulse 97, temperature 98.1 F (36.7 C), temperature source Oral, resp. rate 16, height 5\' 3"  (1.6 m), weight 79.8 kg (176 lb), SpO2 98 %.Body mass index is 31.18 kg/m.  General Appearance: Casual and Fairly Groomed  Engineer, water::  Good  Speech:  Clear and Coherent and Normal Rate  Volume:  Normal  Mood:  Anxious and Euthymic  Affect:  Appropriate, Congruent and Full Range  Thought Process:  Coherent  Orientation:  Full (Time, Place, and Person)  Thought Content:  Hallucinations: Auditory, denies SI/HI/VH  Suicidal Thoughts:  No  Homicidal Thoughts:  No  Memory:  Immediate;   Good Recent;   Good Remote;   Good  Judgement:  Fair  Insight:  Fair  Psychomotor Activity:  Normal  Concentration:  Good  Recall:  Good  Fund of Knowledge:Good  Language: Good  Akathisia:  No  Handed:    AIMS (if indicated):     Assets:  Communication Skills Desire for Improvement Housing Resilience Social Support  Sleep:  Number of Hours: 6.5  Cognition: WNL  ADL's:  Intact   Mental Status Per Nursing Assessment::   On Admission:  Suicidal ideation  indicated by patient  Demographic Factors:  Divorced or widowed, Caucasian and Living alone  Loss  Factors: Financial problems/change in socioeconomic status  Historical Factors: Prior suicide attempts and Family history of mental illness or substance abuse  Risk Reduction Factors:   Sense of responsibility to family, Positive social support, Positive therapeutic relationship and Positive coping skills or problem solving skills  Continued Clinical Symptoms:  Depression:   Severe  Cognitive Features That Contribute To Risk:  None    Suicide Risk:  Mild:  Suicidal ideation of limited frequency, intensity, duration, and specificity.  There are no identifiable plans, no associated intent, mild dysphoria and related symptoms, good self-control (both objective and subjective assessment), few other risk factors, and identifiable protective factors, including available and accessible social support.  Follow-up [name] Follow up on 10/19/2017.   Why:  Follow up appointment on 10/19/17 at 1:30pm.   Contact information: [name]  [address] [place], [place], [place] [address] Phone: [phone] Fax: [phone]       [facility] Follow up on 10/20/2017.   Why:  Thursday at 1:00 for your hospital follow up appointment Contact information: [place] [id number] [phone]          Subjective data: [name] [name] is a 46 y/o F with history of MDD with psychotic features who was admitted with worsening depression, AH, and anxiety directly from her therapy appointment. Pt has recent stressor of her son preparing for deployment to [place]. She also cites living alone as a significant stressor. She had been completing maintenance ECT at [facility] prior to admission. She was also on [place] but had stopped taking supplemental oral dose. She was restarted on oral Invega (titrated to 6mg  qday) and given Sustenna 156mg  IM while admitted (for which she was due). Pt reported improvement of mood, anxiety, and psychotic  symptoms. Today upon evaluation, she reports that her mood is good but she has been having some anxiety due to her roommate (pt was accepting of PRN ativan to address her anxiety). She was able to detail her discharge plan of staying with her daughter as an immediate transition with plan to then move in with her daughter in law. She plans to continue with her current outpatient treatment team at [facility]. She is tolerating her current medications without difficulty or side effect. She is able to engage in safety planning including to return to [facility] or contact emergency services if she feels unable to maintain her own safety. She denies SI/HI/VH today; she continues to endorse some minimal AH but she notes that she is able to ignore the [facility] and it is not overly bothersome. Pt had no further questions, comments, or concerns.    Plan Of Care/Follow-up recommendations:  -DC to outpatient level of care - Continue Lithium 600mg  qhs for depression - Continue Ativan 0.5mg  q6h PRN anxiety - Continue Remeron 7.5mg  qhs for depression and sleep - Continue Invega 6mg  po qhs for psychotic symptoms - Continue Invega Sustenna 156mg  IM q28Days (last given 10/17/17) for psychotic symptoms - Continue metoprolol 25mg  qhs for hypertension  Activity:  as tolerated Diet:  normal Tests:  N/A Other:  see above for DC treatment plan  Pennelope Bracken, MD 10/18/2017, 10:50 AM
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
## 2017-10-18 NOTE — Plan of Care (Signed)
Problem: [facility] Participation in Recreation Therapeutic Interventions Goal: STG-Patient will identify at least five coping skills for ** STG: Coping Skills - Patient will be able to identify at least 5 coping skills for SI by conclusion of recreation therapy tx  Outcome: Completed/Met Date Met: 10/18/17 Pt was able to identify coping skills at completion of coping skills recreation therapy group session.   , LRT/CTRS   

## 2017-10-18 NOTE — Progress Notes (Signed)
Patient ID: [name] [name], female   DOB: 1971-08-12, 46 y.o.   MRN: [id number]  D: Patient in dayroom on approach. Pt reports she is doing well and discharging tomorrow. Pt reports she will be staying with son while son and daughter help her find an assisted living facility for her. Pt reports she is tolerating medication well. Pt denies SI/HI/AVH and pain. Pt attended and participated in evening wrap up group. Cooperative with assessment.  A: Medications administered as prescribed. Support and encouragement provided to continue using coping skills. Pt encouraged to discuss feelings and come to staff with any question or concerns.  R: Patient remains safe and complaint with medications.

## 2017-10-18 NOTE — Progress Notes (Signed)
Recreation Therapy Notes  INPATIENT RECREATION TR PLAN  Patient Details Name: [name] [name] MRN: [id number] DOB: 11/18/1971 Today's Date: 10/18/2017  Rec Therapy Plan Is patient appropriate for Therapeutic Recreation?: Yes Treatment times per week: about 3 days Estimated Length of Stay: 5-7 days TR Treatment/Interventions: Group participation (Comment)  Discharge Criteria Pt will be discharged from therapy if:: Discharged Treatment plan/goals/alternatives discussed and agreed upon by:: Patient/family  Discharge Summary Short term goals set: Patient will be able to identify at least 5 coping skills for SI by conclusion of recreation therapy tx  Short term goals met: Complete Progress toward goals comments: Groups attended Which groups?: Coping skills, Communication, Wellness, Other (Comment) (Decision making) Reason goals not met: None Therapeutic equipment acquired: N/A Reason patient discharged from therapy: Discharge from hospital Pt/family agrees with progress & goals achieved: Yes Date patient discharged from therapy: 10/18/17   Victorino Sparrow, LRT/CTRS  Ria Comment, Arlene Genova A 10/18/2017, 12:25 PM

## 2017-10-18 NOTE — Progress Notes (Signed)
Patient ID: [name] [name], female   DOB: Nov 04, 1971, 46 y.o.   MRN: [id number] Patient discharged to home/self care.  Patient denies SI, HI and AVH upon discharge and reports feeling better.  Patient was smiling and had a bright affect.  Patient talked about hopes for future family plans. Patient acknowledged understanding of all discharge instructions.

## 2017-10-18 NOTE — Progress Notes (Signed)
  [facility] Adult Case Management Discharge Plan :  Will you be returning to the same living situation after discharge:  Yes,  home At discharge, do you have transportation home?: Yes,  family Do you have the ability to pay for your medications: Yes,  MCD  Release of information consent forms completed and in the chart;  Patient's signature needed at discharge.  Patient to Follow up at: Follow-up [place] Follow up on 10/19/2017.   Why:  Follow up appointment on 10/19/17 at 1:30pm.   Contact information: [name]  [place], [place], [place] [address] Phone: [phone] Fax: [phone]       Inc[facility] Follow up on 10/20/2017.   Why:  Thursday at 1:00 for your hospital follow up appointment Contact information: [place] [address] [phone]           Next level of care provider has access to [place] and Suicide Prevention discussed: Yes,  yes  Have you used any form of tobacco in the last 30 days? (Cigarettes, Smokeless Tobacco, Cigars, and/or Pipes): No  Has patient been referred to the Quitline?: N/A patient is not a smoker  Patient has been referred for addiction treatment: [place], LCSW 10/18/2017, 10:47 AM

## 2017-10-18 NOTE — Progress Notes (Signed)
Recreation Therapy Notes  Date: 10/18/17 Time: 1000 Location: 500 Hall Dayroom  Group Topic: Decision Making, Communication  Goal Area(s) Addresses:  Patient will identify factors that guided their decision making.  Patient will identify benefit of healthy decision making post d/c.   Behavioral Response: Engaged, Attentive  Intervention:  Choices in a Jar Game  Activity: Choices in a Jar.  LRT read a card from the jar.  Each card gave the patients two options.  Patients would have to tell with scenario they chose and why.   Education: Education officer, community, Environmental health practitioner, Discharge Planning   Education Outcome: Acknowledges education  Clinical Observations/Feedback: Pt was quiet but engaged when prompted.  Pt was brighter and focused during group.  Pt was able to explain the choices she made during the activity.   Victorino Sparrow, LRT/CTRS         Victorino Sparrow A 10/18/2017 11:51 AM

## 2017-10-19 DIAGNOSIS — F29 Unspecified psychosis not due to a substance or known physiological condition: Secondary | ICD-10-CM | POA: Diagnosis not present

## 2017-10-21 DIAGNOSIS — T50901A Poisoning by unspecified drugs, medicaments and biological substances, accidental (unintentional), initial encounter: Secondary | ICD-10-CM | POA: Diagnosis not present

## 2017-10-21 DIAGNOSIS — F419 Anxiety disorder, unspecified: Secondary | ICD-10-CM | POA: Diagnosis present

## 2017-10-21 DIAGNOSIS — Z751 Person awaiting admission to adequate facility elsewhere: Secondary | ICD-10-CM | POA: Diagnosis not present

## 2017-10-21 DIAGNOSIS — I10 Essential (primary) hypertension: Secondary | ICD-10-CM | POA: Diagnosis not present

## 2017-10-21 DIAGNOSIS — F259 Schizoaffective disorder, unspecified: Secondary | ICD-10-CM | POA: Diagnosis not present

## 2017-10-21 DIAGNOSIS — T50902A Poisoning by unspecified drugs, medicaments and biological substances, intentional self-harm, initial encounter: Secondary | ICD-10-CM | POA: Diagnosis not present

## 2017-10-21 DIAGNOSIS — E119 Type 2 diabetes mellitus without complications: Secondary | ICD-10-CM | POA: Diagnosis present

## 2017-10-21 DIAGNOSIS — F329 Major depressive disorder, single episode, unspecified: Secondary | ICD-10-CM | POA: Diagnosis not present

## 2017-10-21 DIAGNOSIS — Z79899 Other long term (current) drug therapy: Secondary | ICD-10-CM | POA: Diagnosis not present

## 2017-10-21 DIAGNOSIS — E669 Obesity, unspecified: Secondary | ICD-10-CM | POA: Diagnosis not present

## 2017-10-21 DIAGNOSIS — T1491XA Suicide attempt, initial encounter: Secondary | ICD-10-CM | POA: Diagnosis not present

## 2017-10-21 DIAGNOSIS — Z6830 Body mass index (BMI) 30.0-30.9, adult: Secondary | ICD-10-CM | POA: Diagnosis not present

## 2017-10-22 DIAGNOSIS — I10 Essential (primary) hypertension: Secondary | ICD-10-CM

## 2017-10-25 DIAGNOSIS — R45851 Suicidal ideations: Secondary | ICD-10-CM | POA: Diagnosis present

## 2017-10-25 DIAGNOSIS — E669 Obesity, unspecified: Secondary | ICD-10-CM | POA: Diagnosis not present

## 2017-10-25 DIAGNOSIS — F209 Schizophrenia, unspecified: Secondary | ICD-10-CM | POA: Diagnosis not present

## 2017-10-25 DIAGNOSIS — I959 Hypotension, unspecified: Secondary | ICD-10-CM | POA: Diagnosis present

## 2017-10-25 DIAGNOSIS — M4802 Spinal stenosis, cervical region: Secondary | ICD-10-CM | POA: Diagnosis not present

## 2017-10-25 DIAGNOSIS — K219 Gastro-esophageal reflux disease without esophagitis: Secondary | ICD-10-CM | POA: Diagnosis present

## 2017-10-25 DIAGNOSIS — R4182 Altered mental status, unspecified: Secondary | ICD-10-CM | POA: Diagnosis not present

## 2017-10-25 DIAGNOSIS — Z880 Allergy status to penicillin: Secondary | ICD-10-CM | POA: Diagnosis not present

## 2017-10-25 DIAGNOSIS — Z888 Allergy status to other drugs, medicaments and biological substances status: Secondary | ICD-10-CM | POA: Diagnosis not present

## 2017-10-25 DIAGNOSIS — M542 Cervicalgia: Secondary | ICD-10-CM | POA: Diagnosis not present

## 2017-10-25 DIAGNOSIS — T1491XA Suicide attempt, initial encounter: Secondary | ICD-10-CM | POA: Diagnosis not present

## 2017-10-25 DIAGNOSIS — Z62810 Personal history of physical and sexual abuse in childhood: Secondary | ICD-10-CM | POA: Diagnosis present

## 2017-10-25 DIAGNOSIS — R51 Headache: Secondary | ICD-10-CM | POA: Diagnosis not present

## 2017-10-25 DIAGNOSIS — E860 Dehydration: Secondary | ICD-10-CM | POA: Diagnosis not present

## 2017-10-25 DIAGNOSIS — N261 Atrophy of kidney (terminal): Secondary | ICD-10-CM | POA: Diagnosis not present

## 2017-10-25 DIAGNOSIS — F250 Schizoaffective disorder, bipolar type: Secondary | ICD-10-CM | POA: Diagnosis present

## 2017-10-25 DIAGNOSIS — E785 Hyperlipidemia, unspecified: Secondary | ICD-10-CM | POA: Diagnosis present

## 2017-10-25 DIAGNOSIS — T792XXA Traumatic secondary and recurrent hemorrhage and seroma, initial encounter: Secondary | ICD-10-CM | POA: Diagnosis not present

## 2017-10-25 DIAGNOSIS — R079 Chest pain, unspecified: Secondary | ICD-10-CM | POA: Diagnosis not present

## 2017-10-25 DIAGNOSIS — Z87828 Personal history of other (healed) physical injury and trauma: Secondary | ICD-10-CM | POA: Diagnosis not present

## 2017-10-25 DIAGNOSIS — E042 Nontoxic multinodular goiter: Secondary | ICD-10-CM | POA: Diagnosis not present

## 2017-10-25 DIAGNOSIS — F259 Schizoaffective disorder, unspecified: Secondary | ICD-10-CM | POA: Diagnosis not present

## 2017-10-25 DIAGNOSIS — T888XXA Other specified complications of surgical and medical care, not elsewhere classified, initial encounter: Secondary | ICD-10-CM | POA: Diagnosis not present

## 2017-10-25 DIAGNOSIS — F329 Major depressive disorder, single episode, unspecified: Secondary | ICD-10-CM | POA: Diagnosis not present

## 2017-10-25 DIAGNOSIS — I10 Essential (primary) hypertension: Secondary | ICD-10-CM | POA: Diagnosis not present

## 2017-10-25 DIAGNOSIS — Z881 Allergy status to other antibiotic agents status: Secondary | ICD-10-CM | POA: Diagnosis not present

## 2017-10-25 DIAGNOSIS — R110 Nausea: Secondary | ICD-10-CM | POA: Diagnosis not present

## 2017-10-25 DIAGNOSIS — R1011 Right upper quadrant pain: Secondary | ICD-10-CM | POA: Diagnosis not present

## 2017-10-25 DIAGNOSIS — Z9884 Bariatric surgery status: Secondary | ICD-10-CM | POA: Diagnosis not present

## 2017-10-25 DIAGNOSIS — T50901A Poisoning by unspecified drugs, medicaments and biological substances, accidental (unintentional), initial encounter: Secondary | ICD-10-CM | POA: Diagnosis not present

## 2017-10-25 DIAGNOSIS — R0789 Other chest pain: Secondary | ICD-10-CM | POA: Diagnosis not present

## 2017-10-25 DIAGNOSIS — Z6831 Body mass index (BMI) 31.0-31.9, adult: Secondary | ICD-10-CM | POA: Diagnosis not present

## 2017-10-25 DIAGNOSIS — R10816 Epigastric abdominal tenderness: Secondary | ICD-10-CM | POA: Diagnosis not present

## 2017-10-25 DIAGNOSIS — W19XXXA Unspecified fall, initial encounter: Secondary | ICD-10-CM | POA: Diagnosis not present

## 2017-10-25 DIAGNOSIS — R296 Repeated falls: Secondary | ICD-10-CM | POA: Diagnosis not present

## 2017-10-25 DIAGNOSIS — M5031 Other cervical disc degeneration,  high cervical region: Secondary | ICD-10-CM | POA: Diagnosis not present

## 2017-10-30 DIAGNOSIS — M542 Cervicalgia: Secondary | ICD-10-CM | POA: Diagnosis not present

## 2017-10-30 DIAGNOSIS — R4182 Altered mental status, unspecified: Secondary | ICD-10-CM | POA: Diagnosis not present

## 2017-10-30 DIAGNOSIS — T888XXA Other specified complications of surgical and medical care, not elsewhere classified, initial encounter: Secondary | ICD-10-CM | POA: Diagnosis not present

## 2017-10-30 DIAGNOSIS — R0789 Other chest pain: Secondary | ICD-10-CM | POA: Diagnosis not present

## 2017-10-30 DIAGNOSIS — W19XXXA Unspecified fall, initial encounter: Secondary | ICD-10-CM | POA: Diagnosis not present

## 2017-10-30 DIAGNOSIS — T792XXA Traumatic secondary and recurrent hemorrhage and seroma, initial encounter: Secondary | ICD-10-CM | POA: Diagnosis not present

## 2017-10-30 DIAGNOSIS — R079 Chest pain, unspecified: Secondary | ICD-10-CM | POA: Diagnosis not present

## 2017-10-30 DIAGNOSIS — R51 Headache: Secondary | ICD-10-CM | POA: Diagnosis not present

## 2017-10-30 DIAGNOSIS — R110 Nausea: Secondary | ICD-10-CM | POA: Diagnosis not present

## 2017-10-30 DIAGNOSIS — R1011 Right upper quadrant pain: Secondary | ICD-10-CM | POA: Diagnosis not present

## 2017-10-30 DIAGNOSIS — F259 Schizoaffective disorder, unspecified: Secondary | ICD-10-CM | POA: Diagnosis not present

## 2017-10-30 DIAGNOSIS — R10816 Epigastric abdominal tenderness: Secondary | ICD-10-CM | POA: Diagnosis not present

## 2017-11-07 DIAGNOSIS — E669 Obesity, unspecified: Secondary | ICD-10-CM | POA: Diagnosis not present

## 2017-11-07 DIAGNOSIS — Z888 Allergy status to other drugs, medicaments and biological substances status: Secondary | ICD-10-CM | POA: Diagnosis not present

## 2017-11-07 DIAGNOSIS — E119 Type 2 diabetes mellitus without complications: Secondary | ICD-10-CM | POA: Diagnosis not present

## 2017-11-07 DIAGNOSIS — Z6834 Body mass index (BMI) 34.0-34.9, adult: Secondary | ICD-10-CM | POA: Diagnosis not present

## 2017-11-07 DIAGNOSIS — F419 Anxiety disorder, unspecified: Secondary | ICD-10-CM | POA: Diagnosis not present

## 2017-11-07 DIAGNOSIS — F251 Schizoaffective disorder, depressive type: Secondary | ICD-10-CM | POA: Diagnosis not present

## 2017-11-07 DIAGNOSIS — Z880 Allergy status to penicillin: Secondary | ICD-10-CM | POA: Diagnosis not present

## 2017-11-07 DIAGNOSIS — F332 Major depressive disorder, recurrent severe without psychotic features: Secondary | ICD-10-CM | POA: Diagnosis not present

## 2017-11-07 DIAGNOSIS — Z9884 Bariatric surgery status: Secondary | ICD-10-CM | POA: Diagnosis not present

## 2017-11-07 DIAGNOSIS — Z79899 Other long term (current) drug therapy: Secondary | ICD-10-CM | POA: Diagnosis not present

## 2017-11-07 DIAGNOSIS — K219 Gastro-esophageal reflux disease without esophagitis: Secondary | ICD-10-CM | POA: Diagnosis not present

## 2017-11-07 DIAGNOSIS — E785 Hyperlipidemia, unspecified: Secondary | ICD-10-CM | POA: Diagnosis not present

## 2017-11-07 DIAGNOSIS — I10 Essential (primary) hypertension: Secondary | ICD-10-CM | POA: Diagnosis not present

## 2017-11-07 DIAGNOSIS — Z881 Allergy status to other antibiotic agents status: Secondary | ICD-10-CM | POA: Diagnosis not present

## 2017-11-10 DIAGNOSIS — F29 Unspecified psychosis not due to a substance or known physiological condition: Secondary | ICD-10-CM | POA: Diagnosis not present

## 2017-11-14 DIAGNOSIS — I10 Essential (primary) hypertension: Secondary | ICD-10-CM | POA: Diagnosis not present

## 2017-11-14 DIAGNOSIS — Z880 Allergy status to penicillin: Secondary | ICD-10-CM | POA: Diagnosis not present

## 2017-11-14 DIAGNOSIS — F251 Schizoaffective disorder, depressive type: Secondary | ICD-10-CM | POA: Diagnosis not present

## 2017-11-14 DIAGNOSIS — F419 Anxiety disorder, unspecified: Secondary | ICD-10-CM | POA: Diagnosis not present

## 2017-11-14 DIAGNOSIS — Z6831 Body mass index (BMI) 31.0-31.9, adult: Secondary | ICD-10-CM | POA: Diagnosis not present

## 2017-11-14 DIAGNOSIS — E785 Hyperlipidemia, unspecified: Secondary | ICD-10-CM | POA: Diagnosis not present

## 2017-11-14 DIAGNOSIS — Z881 Allergy status to other antibiotic agents status: Secondary | ICD-10-CM | POA: Diagnosis not present

## 2017-11-14 DIAGNOSIS — Z888 Allergy status to other drugs, medicaments and biological substances status: Secondary | ICD-10-CM | POA: Diagnosis not present

## 2017-11-14 DIAGNOSIS — F332 Major depressive disorder, recurrent severe without psychotic features: Secondary | ICD-10-CM | POA: Diagnosis not present

## 2017-11-14 DIAGNOSIS — K219 Gastro-esophageal reflux disease without esophagitis: Secondary | ICD-10-CM | POA: Diagnosis not present

## 2017-11-14 DIAGNOSIS — E119 Type 2 diabetes mellitus without complications: Secondary | ICD-10-CM | POA: Diagnosis not present

## 2017-11-14 DIAGNOSIS — E669 Obesity, unspecified: Secondary | ICD-10-CM | POA: Diagnosis not present

## 2017-11-16 DIAGNOSIS — F29 Unspecified psychosis not due to a substance or known physiological condition: Secondary | ICD-10-CM | POA: Diagnosis not present

## 2017-11-16 DIAGNOSIS — F603 Borderline personality disorder: Secondary | ICD-10-CM | POA: Diagnosis not present

## 2017-11-22 DIAGNOSIS — Z79899 Other long term (current) drug therapy: Secondary | ICD-10-CM | POA: Diagnosis not present

## 2017-11-22 DIAGNOSIS — Z6831 Body mass index (BMI) 31.0-31.9, adult: Secondary | ICD-10-CM | POA: Diagnosis not present

## 2017-11-22 DIAGNOSIS — Z880 Allergy status to penicillin: Secondary | ICD-10-CM | POA: Diagnosis not present

## 2017-11-22 DIAGNOSIS — K219 Gastro-esophageal reflux disease without esophagitis: Secondary | ICD-10-CM | POA: Diagnosis not present

## 2017-11-22 DIAGNOSIS — E669 Obesity, unspecified: Secondary | ICD-10-CM | POA: Diagnosis not present

## 2017-11-22 DIAGNOSIS — Z9884 Bariatric surgery status: Secondary | ICD-10-CM | POA: Diagnosis not present

## 2017-11-22 DIAGNOSIS — I10 Essential (primary) hypertension: Secondary | ICD-10-CM | POA: Diagnosis not present

## 2017-11-22 DIAGNOSIS — Z881 Allergy status to other antibiotic agents status: Secondary | ICD-10-CM | POA: Diagnosis not present

## 2017-11-22 DIAGNOSIS — E119 Type 2 diabetes mellitus without complications: Secondary | ICD-10-CM | POA: Diagnosis not present

## 2017-11-22 DIAGNOSIS — E785 Hyperlipidemia, unspecified: Secondary | ICD-10-CM | POA: Diagnosis not present

## 2017-11-22 DIAGNOSIS — F4481 Dissociative identity disorder: Secondary | ICD-10-CM | POA: Diagnosis not present

## 2017-11-22 DIAGNOSIS — Z888 Allergy status to other drugs, medicaments and biological substances status: Secondary | ICD-10-CM | POA: Diagnosis not present

## 2017-11-22 DIAGNOSIS — F251 Schizoaffective disorder, depressive type: Secondary | ICD-10-CM | POA: Diagnosis not present

## 2017-11-23 DIAGNOSIS — I10 Essential (primary) hypertension: Secondary | ICD-10-CM | POA: Diagnosis not present

## 2017-11-23 DIAGNOSIS — R7303 Prediabetes: Secondary | ICD-10-CM | POA: Diagnosis not present

## 2017-11-24 DIAGNOSIS — F29 Unspecified psychosis not due to a substance or known physiological condition: Secondary | ICD-10-CM | POA: Diagnosis not present

## 2017-11-28 DIAGNOSIS — F251 Schizoaffective disorder, depressive type: Secondary | ICD-10-CM | POA: Diagnosis not present

## 2017-11-28 DIAGNOSIS — E119 Type 2 diabetes mellitus without complications: Secondary | ICD-10-CM | POA: Diagnosis not present

## 2017-11-28 DIAGNOSIS — Z6831 Body mass index (BMI) 31.0-31.9, adult: Secondary | ICD-10-CM | POA: Diagnosis not present

## 2017-11-28 DIAGNOSIS — I10 Essential (primary) hypertension: Secondary | ICD-10-CM | POA: Diagnosis not present

## 2017-11-28 DIAGNOSIS — F419 Anxiety disorder, unspecified: Secondary | ICD-10-CM | POA: Diagnosis not present

## 2017-11-28 DIAGNOSIS — F338 Other recurrent depressive disorders: Secondary | ICD-10-CM | POA: Diagnosis not present

## 2017-11-28 DIAGNOSIS — E669 Obesity, unspecified: Secondary | ICD-10-CM | POA: Diagnosis not present

## 2017-11-28 DIAGNOSIS — F4481 Dissociative identity disorder: Secondary | ICD-10-CM | POA: Diagnosis not present

## 2017-11-28 DIAGNOSIS — Z888 Allergy status to other drugs, medicaments and biological substances status: Secondary | ICD-10-CM | POA: Diagnosis not present

## 2017-11-28 DIAGNOSIS — K219 Gastro-esophageal reflux disease without esophagitis: Secondary | ICD-10-CM | POA: Diagnosis not present

## 2017-11-28 DIAGNOSIS — Z79899 Other long term (current) drug therapy: Secondary | ICD-10-CM | POA: Diagnosis not present

## 2017-11-28 DIAGNOSIS — Z880 Allergy status to penicillin: Secondary | ICD-10-CM | POA: Diagnosis not present

## 2017-11-28 DIAGNOSIS — E785 Hyperlipidemia, unspecified: Secondary | ICD-10-CM | POA: Diagnosis not present

## 2017-12-01 DIAGNOSIS — F29 Unspecified psychosis not due to a substance or known physiological condition: Secondary | ICD-10-CM | POA: Diagnosis not present

## 2017-12-08 DIAGNOSIS — F29 Unspecified psychosis not due to a substance or known physiological condition: Secondary | ICD-10-CM | POA: Diagnosis not present

## 2017-12-12 DIAGNOSIS — E669 Obesity, unspecified: Secondary | ICD-10-CM | POA: Diagnosis not present

## 2017-12-12 DIAGNOSIS — F4481 Dissociative identity disorder: Secondary | ICD-10-CM | POA: Diagnosis not present

## 2017-12-12 DIAGNOSIS — Z888 Allergy status to other drugs, medicaments and biological substances status: Secondary | ICD-10-CM | POA: Diagnosis not present

## 2017-12-12 DIAGNOSIS — K219 Gastro-esophageal reflux disease without esophagitis: Secondary | ICD-10-CM | POA: Diagnosis not present

## 2017-12-12 DIAGNOSIS — Z881 Allergy status to other antibiotic agents status: Secondary | ICD-10-CM | POA: Diagnosis not present

## 2017-12-12 DIAGNOSIS — F251 Schizoaffective disorder, depressive type: Secondary | ICD-10-CM | POA: Diagnosis not present

## 2017-12-12 DIAGNOSIS — E119 Type 2 diabetes mellitus without complications: Secondary | ICD-10-CM | POA: Diagnosis not present

## 2017-12-12 DIAGNOSIS — I10 Essential (primary) hypertension: Secondary | ICD-10-CM | POA: Diagnosis not present

## 2017-12-12 DIAGNOSIS — F419 Anxiety disorder, unspecified: Secondary | ICD-10-CM | POA: Diagnosis not present

## 2017-12-12 DIAGNOSIS — Z6831 Body mass index (BMI) 31.0-31.9, adult: Secondary | ICD-10-CM | POA: Diagnosis not present

## 2017-12-12 DIAGNOSIS — Z880 Allergy status to penicillin: Secondary | ICD-10-CM | POA: Diagnosis not present

## 2017-12-12 DIAGNOSIS — E785 Hyperlipidemia, unspecified: Secondary | ICD-10-CM | POA: Diagnosis not present

## 2017-12-14 DIAGNOSIS — F603 Borderline personality disorder: Secondary | ICD-10-CM | POA: Diagnosis not present

## 2017-12-15 DIAGNOSIS — F29 Unspecified psychosis not due to a substance or known physiological condition: Secondary | ICD-10-CM | POA: Diagnosis not present

## 2017-12-21 ENCOUNTER — Ambulatory Visit
Admission: RE | Admit: 2017-12-21 | Discharge: 2017-12-21 | Disposition: A | Discharge status: Home / Self Care | Payer: Medicare Other | Source: Ambulatory Visit | Attending: General Surgery | Admitting: General Surgery

## 2017-12-21 ENCOUNTER — Other Ambulatory Visit: Payer: Self-pay | Admitting: General Surgery

## 2017-12-21 DIAGNOSIS — E669 Obesity, unspecified: Secondary | ICD-10-CM | POA: Diagnosis not present

## 2017-12-21 DIAGNOSIS — R197 Diarrhea, unspecified: Secondary | ICD-10-CM | POA: Diagnosis not present

## 2017-12-21 DIAGNOSIS — Z9884 Bariatric surgery status: Secondary | ICD-10-CM | POA: Diagnosis not present

## 2017-12-21 DIAGNOSIS — N1330 Unspecified hydronephrosis: Secondary | ICD-10-CM | POA: Diagnosis not present

## 2017-12-21 MED ORDER — IOPAMIDOL (ISOVUE-300) INJECTION 61%
100.0000 mL | Freq: Once | INTRAVENOUS | Status: AC | PRN
  Administered 2017-12-21: 100 mL via INTRAVENOUS

## 2017-12-26 DIAGNOSIS — E119 Type 2 diabetes mellitus without complications: Secondary | ICD-10-CM | POA: Diagnosis not present

## 2017-12-26 DIAGNOSIS — E669 Obesity, unspecified: Secondary | ICD-10-CM | POA: Diagnosis not present

## 2017-12-26 DIAGNOSIS — E785 Hyperlipidemia, unspecified: Secondary | ICD-10-CM | POA: Diagnosis not present

## 2017-12-26 DIAGNOSIS — F419 Anxiety disorder, unspecified: Secondary | ICD-10-CM | POA: Diagnosis not present

## 2017-12-26 DIAGNOSIS — I10 Essential (primary) hypertension: Secondary | ICD-10-CM | POA: Diagnosis not present

## 2017-12-26 DIAGNOSIS — Z9884 Bariatric surgery status: Secondary | ICD-10-CM | POA: Diagnosis not present

## 2017-12-26 DIAGNOSIS — Z6835 Body mass index (BMI) 35.0-35.9, adult: Secondary | ICD-10-CM | POA: Diagnosis not present

## 2017-12-26 DIAGNOSIS — F332 Major depressive disorder, recurrent severe without psychotic features: Secondary | ICD-10-CM | POA: Diagnosis not present

## 2017-12-26 DIAGNOSIS — Z888 Allergy status to other drugs, medicaments and biological substances status: Secondary | ICD-10-CM | POA: Diagnosis not present

## 2017-12-26 DIAGNOSIS — Z881 Allergy status to other antibiotic agents status: Secondary | ICD-10-CM | POA: Diagnosis not present

## 2017-12-26 DIAGNOSIS — Z880 Allergy status to penicillin: Secondary | ICD-10-CM | POA: Diagnosis not present

## 2017-12-26 DIAGNOSIS — F251 Schizoaffective disorder, depressive type: Secondary | ICD-10-CM | POA: Diagnosis not present

## 2017-12-29 DIAGNOSIS — F29 Unspecified psychosis not due to a substance or known physiological condition: Secondary | ICD-10-CM | POA: Diagnosis not present

## 2018-01-03 DIAGNOSIS — F251 Schizoaffective disorder, depressive type: Secondary | ICD-10-CM | POA: Diagnosis not present

## 2018-01-03 DIAGNOSIS — F29 Unspecified psychosis not due to a substance or known physiological condition: Secondary | ICD-10-CM | POA: Diagnosis not present

## 2018-01-03 DIAGNOSIS — T1491XA Suicide attempt, initial encounter: Secondary | ICD-10-CM | POA: Diagnosis not present

## 2018-01-03 DIAGNOSIS — T43502A Poisoning by unspecified antipsychotics and neuroleptics, intentional self-harm, initial encounter: Secondary | ICD-10-CM | POA: Diagnosis not present

## 2018-01-03 DIAGNOSIS — X838XXA Intentional self-harm by other specified means, initial encounter: Secondary | ICD-10-CM | POA: Diagnosis not present

## 2018-01-03 DIAGNOSIS — E119 Type 2 diabetes mellitus without complications: Secondary | ICD-10-CM | POA: Diagnosis not present

## 2018-01-03 DIAGNOSIS — Y999 Unspecified external cause status: Secondary | ICD-10-CM | POA: Diagnosis not present

## 2018-01-03 DIAGNOSIS — I10 Essential (primary) hypertension: Secondary | ICD-10-CM | POA: Diagnosis not present

## 2018-01-03 DIAGNOSIS — F250 Schizoaffective disorder, bipolar type: Secondary | ICD-10-CM | POA: Diagnosis not present

## 2018-01-04 DIAGNOSIS — F250 Schizoaffective disorder, bipolar type: Secondary | ICD-10-CM | POA: Diagnosis present

## 2018-01-04 DIAGNOSIS — E119 Type 2 diabetes mellitus without complications: Secondary | ICD-10-CM | POA: Diagnosis present

## 2018-01-04 DIAGNOSIS — F251 Schizoaffective disorder, depressive type: Secondary | ICD-10-CM | POA: Diagnosis not present

## 2018-01-04 DIAGNOSIS — I10 Essential (primary) hypertension: Secondary | ICD-10-CM | POA: Diagnosis present

## 2018-01-11 DIAGNOSIS — R569 Unspecified convulsions: Secondary | ICD-10-CM | POA: Diagnosis not present

## 2018-01-11 DIAGNOSIS — F418 Other specified anxiety disorders: Secondary | ICD-10-CM | POA: Diagnosis not present

## 2018-01-11 DIAGNOSIS — T43595A Adverse effect of other antipsychotics and neuroleptics, initial encounter: Secondary | ICD-10-CM | POA: Diagnosis not present

## 2018-01-11 DIAGNOSIS — F603 Borderline personality disorder: Secondary | ICD-10-CM | POA: Diagnosis not present

## 2018-01-11 DIAGNOSIS — F29 Unspecified psychosis not due to a substance or known physiological condition: Secondary | ICD-10-CM | POA: Diagnosis not present

## 2018-01-11 DIAGNOSIS — T43505A Adverse effect of unspecified antipsychotics and neuroleptics, initial encounter: Secondary | ICD-10-CM | POA: Diagnosis not present

## 2018-01-18 DIAGNOSIS — F29 Unspecified psychosis not due to a substance or known physiological condition: Secondary | ICD-10-CM | POA: Diagnosis not present

## 2018-02-02 DIAGNOSIS — R42 Dizziness and giddiness: Secondary | ICD-10-CM | POA: Diagnosis not present

## 2018-02-02 DIAGNOSIS — R5383 Other fatigue: Secondary | ICD-10-CM | POA: Diagnosis not present

## 2018-02-02 DIAGNOSIS — R531 Weakness: Secondary | ICD-10-CM | POA: Diagnosis not present

## 2018-02-02 DIAGNOSIS — R509 Fever, unspecified: Secondary | ICD-10-CM | POA: Diagnosis not present

## 2018-02-02 DIAGNOSIS — I951 Orthostatic hypotension: Secondary | ICD-10-CM | POA: Diagnosis not present

## 2018-02-03 DIAGNOSIS — L309 Dermatitis, unspecified: Secondary | ICD-10-CM | POA: Diagnosis not present

## 2018-02-08 DIAGNOSIS — F29 Unspecified psychosis not due to a substance or known physiological condition: Secondary | ICD-10-CM | POA: Diagnosis not present

## 2018-02-14 ENCOUNTER — Emergency Department (HOSPITAL_COMMUNITY)
Admission: EM | Admit: 2018-02-14 | Discharge: 2018-02-15 | Disposition: A | Discharge status: Home / Self Care | Payer: Medicare Other | Attending: Emergency Medicine | Admitting: Emergency Medicine

## 2018-02-14 ENCOUNTER — Emergency Department (HOSPITAL_COMMUNITY): Payer: Medicare Other

## 2018-02-14 ENCOUNTER — Encounter (HOSPITAL_COMMUNITY): Payer: Self-pay

## 2018-02-14 ENCOUNTER — Other Ambulatory Visit: Payer: Self-pay

## 2018-02-14 DIAGNOSIS — Z933 Colostomy status: Secondary | ICD-10-CM | POA: Diagnosis not present

## 2018-02-14 DIAGNOSIS — R1033 Periumbilical pain: Secondary | ICD-10-CM | POA: Diagnosis not present

## 2018-02-14 DIAGNOSIS — R102 Pelvic and perineal pain: Secondary | ICD-10-CM | POA: Diagnosis not present

## 2018-02-14 DIAGNOSIS — Z8679 Personal history of other diseases of the circulatory system: Secondary | ICD-10-CM | POA: Diagnosis not present

## 2018-02-14 DIAGNOSIS — I10 Essential (primary) hypertension: Secondary | ICD-10-CM | POA: Diagnosis not present

## 2018-02-14 DIAGNOSIS — E119 Type 2 diabetes mellitus without complications: Secondary | ICD-10-CM | POA: Insufficient documentation

## 2018-02-14 DIAGNOSIS — N939 Abnormal uterine and vaginal bleeding, unspecified: Secondary | ICD-10-CM | POA: Insufficient documentation

## 2018-02-14 DIAGNOSIS — R109 Unspecified abdominal pain: Secondary | ICD-10-CM | POA: Diagnosis not present

## 2018-02-14 DIAGNOSIS — Z7984 Long term (current) use of oral hypoglycemic drugs: Secondary | ICD-10-CM | POA: Insufficient documentation

## 2018-02-14 DIAGNOSIS — Z79899 Other long term (current) drug therapy: Secondary | ICD-10-CM | POA: Diagnosis not present

## 2018-02-14 DIAGNOSIS — Z86711 Personal history of pulmonary embolism: Secondary | ICD-10-CM | POA: Insufficient documentation

## 2018-02-14 LAB — URINALYSIS, ROUTINE W REFLEX MICROSCOPIC
Bilirubin Urine: NEGATIVE
GLUCOSE, UA: NEGATIVE mg/dL
KETONES UR: NEGATIVE mg/dL
Nitrite: NEGATIVE
Protein, ur: NEGATIVE mg/dL
Specific Gravity, Urine: 1.012 (ref 1.005–1.030)
pH: 6 (ref 5.0–8.0)

## 2018-02-14 LAB — WET PREP, GENITAL
Clue Cells Wet Prep HPF POC: NONE SEEN
SPERM: NONE SEEN
TRICH WET PREP: NONE SEEN
YEAST WET PREP: NONE SEEN

## 2018-02-14 LAB — LIPASE, BLOOD: Lipase: 35 U/L (ref 11–51)

## 2018-02-14 LAB — CBC
HEMATOCRIT: 39.9 % (ref 36.0–46.0)
HEMOGLOBIN: 12.7 g/dL (ref 12.0–15.0)
MCH: 27.3 pg (ref 26.0–34.0)
MCHC: 31.8 g/dL (ref 30.0–36.0)
MCV: 85.6 fL (ref 78.0–100.0)
Platelets: 291 10*3/uL (ref 150–400)
RBC: 4.66 MIL/uL (ref 3.87–5.11)
RDW: 13 % (ref 11.5–15.5)
WBC: 7.5 10*3/uL (ref 4.0–10.5)

## 2018-02-14 LAB — COMPREHENSIVE METABOLIC PANEL
ALBUMIN: 3.9 g/dL (ref 3.5–5.0)
ALK PHOS: 94 U/L (ref 38–126)
ALT: 9 U/L — ABNORMAL LOW (ref 14–54)
ANION GAP: 11 (ref 5–15)
AST: 17 U/L (ref 15–41)
BUN: 10 mg/dL (ref 6–20)
CALCIUM: 9.2 mg/dL (ref 8.9–10.3)
CO2: 22 mmol/L (ref 22–32)
Chloride: 106 mmol/L (ref 101–111)
Creatinine, Ser: 1.01 mg/dL — ABNORMAL HIGH (ref 0.44–1.00)
GFR calc non Af Amer: >60 mL/min (ref >60)
GLUCOSE: 101 mg/dL — AB (ref 65–99)
POTASSIUM: 3.9 mmol/L (ref 3.5–5.1)
Sodium: 139 mmol/L (ref 135–145)
Total Bilirubin: 0.4 mg/dL (ref 0.3–1.2)
Total Protein: 6.5 g/dL (ref 6.5–8.1)

## 2018-02-14 LAB — I-STAT BETA HCG BLOOD, ED (MC, WL, AP ONLY)

## 2018-02-14 MED ORDER — IOPAMIDOL (ISOVUE-300) INJECTION 61%
INTRAVENOUS | Status: AC
  Administered 2018-02-14: 100 mL
  Filled 2018-02-14: qty 100

## 2018-02-14 NOTE — ED Triage Notes (Signed)
Per Pt, Pt is coming from home with abdominal pain that started today and vaginal bleeding that started yesterday. Denies urinary symptoms, N/V/D

## 2018-02-14 NOTE — ED Notes (Signed)
Pelvic exam done by Sophia - PA and Lavella Lemons - EMT assisted.

## 2018-02-14 NOTE — ED Notes (Signed)
Patient transported to CT 

## 2018-02-14 NOTE — ED Notes (Signed)
Pt reports sudden onset of RLQ pain today.  Now she reports pain is in her low abd, on both sides.  She denies n/v/d but reports spotting and she has not had a period since May.

## 2018-02-15 DIAGNOSIS — N938 Other specified abnormal uterine and vaginal bleeding: Secondary | ICD-10-CM | POA: Diagnosis not present

## 2018-02-15 DIAGNOSIS — Z113 Encounter for screening for infections with a predominantly sexual mode of transmission: Secondary | ICD-10-CM | POA: Diagnosis not present

## 2018-02-15 DIAGNOSIS — R102 Pelvic and perineal pain: Secondary | ICD-10-CM | POA: Diagnosis not present

## 2018-02-15 DIAGNOSIS — Z779 Other contact with and (suspected) exposures hazardous to health: Secondary | ICD-10-CM | POA: Diagnosis not present

## 2018-02-15 DIAGNOSIS — N925 Other specified irregular menstruation: Secondary | ICD-10-CM | POA: Diagnosis not present

## 2018-02-15 DIAGNOSIS — N739 Female pelvic inflammatory disease, unspecified: Secondary | ICD-10-CM | POA: Diagnosis not present

## 2018-02-15 NOTE — ED Notes (Signed)
Threasa Alpha EDPA aware of pt's elevated BP and okayed pt to be discharged

## 2018-02-15 NOTE — Discharge Instructions (Signed)
Take tylenol 3 times a day for pain.  Use heating pads for pain.  Follow up with your ob/gyn for further evaluation and management of your abdominal pain.  Return to the ER if you develop worsening pain, fevers, weakness, or any new or worsening symptoms.
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
## 2018-02-17 DIAGNOSIS — F29 Unspecified psychosis not due to a substance or known physiological condition: Secondary | ICD-10-CM | POA: Diagnosis not present

## 2018-02-22 DIAGNOSIS — F603 Borderline personality disorder: Secondary | ICD-10-CM | POA: Diagnosis not present

## 2018-02-22 DIAGNOSIS — N952 Postmenopausal atrophic vaginitis: Secondary | ICD-10-CM | POA: Diagnosis not present

## 2018-02-22 DIAGNOSIS — R102 Pelvic and perineal pain: Secondary | ICD-10-CM | POA: Diagnosis not present

## 2018-02-22 DIAGNOSIS — N926 Irregular menstruation, unspecified: Secondary | ICD-10-CM | POA: Diagnosis not present

## 2018-02-23 DIAGNOSIS — R109 Unspecified abdominal pain: Secondary | ICD-10-CM | POA: Diagnosis not present

## 2018-02-23 DIAGNOSIS — F29 Unspecified psychosis not due to a substance or known physiological condition: Secondary | ICD-10-CM | POA: Diagnosis not present

## 2018-02-23 DIAGNOSIS — R402441 Other coma, without documented Glasgow coma scale score, or with partial score reported, in the field [EMT or ambulance]: Secondary | ICD-10-CM | POA: Diagnosis not present

## 2018-02-23 DIAGNOSIS — R1030 Lower abdominal pain, unspecified: Secondary | ICD-10-CM | POA: Diagnosis not present

## 2018-02-23 DIAGNOSIS — M545 Low back pain: Secondary | ICD-10-CM | POA: Diagnosis not present

## 2018-02-28 DIAGNOSIS — Z79899 Other long term (current) drug therapy: Secondary | ICD-10-CM | POA: Diagnosis not present

## 2018-02-28 DIAGNOSIS — F29 Unspecified psychosis not due to a substance or known physiological condition: Secondary | ICD-10-CM | POA: Diagnosis not present

## 2018-02-28 DIAGNOSIS — F251 Schizoaffective disorder, depressive type: Secondary | ICD-10-CM | POA: Diagnosis not present

## 2018-02-28 DIAGNOSIS — E119 Type 2 diabetes mellitus without complications: Secondary | ICD-10-CM | POA: Diagnosis not present

## 2018-02-28 DIAGNOSIS — I10 Essential (primary) hypertension: Secondary | ICD-10-CM | POA: Diagnosis not present

## 2018-02-28 DIAGNOSIS — F259 Schizoaffective disorder, unspecified: Secondary | ICD-10-CM | POA: Diagnosis not present

## 2018-02-28 DIAGNOSIS — F28 Other psychotic disorder not due to a substance or known physiological condition: Secondary | ICD-10-CM | POA: Diagnosis not present

## 2018-02-28 DIAGNOSIS — R000 Tachycardia, unspecified: Secondary | ICD-10-CM | POA: Diagnosis not present

## 2018-03-02 DIAGNOSIS — F29 Unspecified psychosis not due to a substance or known physiological condition: Secondary | ICD-10-CM | POA: Diagnosis not present

## 2018-03-03 DIAGNOSIS — E221 Hyperprolactinemia: Secondary | ICD-10-CM | POA: Diagnosis not present

## 2018-03-09 DIAGNOSIS — F29 Unspecified psychosis not due to a substance or known physiological condition: Secondary | ICD-10-CM | POA: Diagnosis not present

## 2018-03-10 ENCOUNTER — Other Ambulatory Visit: Payer: Self-pay | Admitting: Family Medicine

## 2018-03-10 DIAGNOSIS — E221 Hyperprolactinemia: Secondary | ICD-10-CM

## 2018-03-11 DIAGNOSIS — M7660 Achilles tendinitis, unspecified leg: Secondary | ICD-10-CM | POA: Diagnosis not present

## 2018-03-15 DIAGNOSIS — R51 Headache: Secondary | ICD-10-CM | POA: Diagnosis not present

## 2018-03-15 DIAGNOSIS — N911 Secondary amenorrhea: Secondary | ICD-10-CM | POA: Diagnosis not present

## 2018-03-15 DIAGNOSIS — N9489 Other specified conditions associated with female genital organs and menstrual cycle: Secondary | ICD-10-CM | POA: Diagnosis not present

## 2018-03-15 DIAGNOSIS — R358 Other polyuria: Secondary | ICD-10-CM | POA: Diagnosis not present

## 2018-03-15 DIAGNOSIS — R351 Nocturia: Secondary | ICD-10-CM | POA: Diagnosis not present

## 2018-03-15 DIAGNOSIS — E221 Hyperprolactinemia: Secondary | ICD-10-CM | POA: Diagnosis not present

## 2018-03-15 DIAGNOSIS — D352 Benign neoplasm of pituitary gland: Secondary | ICD-10-CM | POA: Diagnosis not present

## 2018-03-16 ENCOUNTER — Other Ambulatory Visit: Payer: Self-pay

## 2018-03-16 DIAGNOSIS — F29 Unspecified psychosis not due to a substance or known physiological condition: Secondary | ICD-10-CM | POA: Diagnosis not present

## 2018-03-17 DIAGNOSIS — E221 Hyperprolactinemia: Secondary | ICD-10-CM | POA: Diagnosis not present

## 2018-03-17 DIAGNOSIS — D352 Benign neoplasm of pituitary gland: Secondary | ICD-10-CM | POA: Diagnosis not present

## 2018-03-19 ENCOUNTER — Ambulatory Visit
Admission: RE | Admit: 2018-03-19 | Discharge: 2018-03-19 | Disposition: A | Discharge status: Home / Self Care | Payer: Medicare Other | Source: Ambulatory Visit | Attending: Family Medicine | Admitting: Family Medicine

## 2018-03-19 DIAGNOSIS — H538 Other visual disturbances: Secondary | ICD-10-CM | POA: Diagnosis not present

## 2018-03-19 DIAGNOSIS — E221 Hyperprolactinemia: Secondary | ICD-10-CM

## 2018-03-19 MED ORDER — GADOBENATE DIMEGLUMINE 529 MG/ML IV SOLN
8.0000 mL | Freq: Once | INTRAVENOUS | Status: AC | PRN
Start: 2018-03-19 — End: 2018-03-19
  Administered 2018-03-19: 8 mL via INTRAVENOUS

## 2018-03-22 DIAGNOSIS — L603 Nail dystrophy: Secondary | ICD-10-CM | POA: Diagnosis not present

## 2018-03-23 DIAGNOSIS — F29 Unspecified psychosis not due to a substance or known physiological condition: Secondary | ICD-10-CM | POA: Diagnosis not present

## 2018-03-27 ENCOUNTER — Ambulatory Visit (INDEPENDENT_AMBULATORY_CARE_PROVIDER_SITE_OTHER): Payer: Medicare Other | Admitting: Podiatry

## 2018-03-27 DIAGNOSIS — B351 Tinea unguium: Secondary | ICD-10-CM | POA: Diagnosis not present

## 2018-03-27 DIAGNOSIS — M79609 Pain in unspecified limb: Secondary | ICD-10-CM | POA: Diagnosis not present

## 2018-03-27 DIAGNOSIS — L600 Ingrowing nail: Secondary | ICD-10-CM
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
## 2018-03-27 NOTE — Patient Instructions (Signed)

## 2018-03-29 DIAGNOSIS — F251 Schizoaffective disorder, depressive type: Secondary | ICD-10-CM | POA: Diagnosis not present

## 2018-03-30 DIAGNOSIS — F29 Unspecified psychosis not due to a substance or known physiological condition: Secondary | ICD-10-CM | POA: Diagnosis not present

## 2018-03-30 DIAGNOSIS — E221 Hyperprolactinemia: Secondary | ICD-10-CM | POA: Diagnosis not present

## 2018-03-30 DIAGNOSIS — R739 Hyperglycemia, unspecified: Secondary | ICD-10-CM | POA: Diagnosis not present

## 2018-04-04 DIAGNOSIS — I4581 Long QT syndrome: Secondary | ICD-10-CM | POA: Diagnosis not present

## 2018-04-04 DIAGNOSIS — E119 Type 2 diabetes mellitus without complications: Secondary | ICD-10-CM | POA: Diagnosis not present

## 2018-04-04 DIAGNOSIS — T56892A Toxic effect of other metals, intentional self-harm, initial encounter: Secondary | ICD-10-CM | POA: Diagnosis not present

## 2018-04-04 DIAGNOSIS — I499 Cardiac arrhythmia, unspecified: Secondary | ICD-10-CM | POA: Diagnosis not present

## 2018-04-04 DIAGNOSIS — F259 Schizoaffective disorder, unspecified: Secondary | ICD-10-CM | POA: Diagnosis not present

## 2018-04-04 DIAGNOSIS — D509 Iron deficiency anemia, unspecified: Secondary | ICD-10-CM | POA: Diagnosis not present

## 2018-04-04 DIAGNOSIS — R600 Localized edema: Secondary | ICD-10-CM | POA: Diagnosis not present

## 2018-04-04 DIAGNOSIS — R0789 Other chest pain: Secondary | ICD-10-CM | POA: Diagnosis not present

## 2018-04-04 DIAGNOSIS — E785 Hyperlipidemia, unspecified: Secondary | ICD-10-CM | POA: Diagnosis not present

## 2018-04-04 DIAGNOSIS — T43592A Poisoning by other antipsychotics and neuroleptics, intentional self-harm, initial encounter: Secondary | ICD-10-CM | POA: Diagnosis not present

## 2018-04-04 DIAGNOSIS — R45851 Suicidal ideations: Secondary | ICD-10-CM | POA: Diagnosis not present

## 2018-04-05 DIAGNOSIS — I499 Cardiac arrhythmia, unspecified: Secondary | ICD-10-CM | POA: Diagnosis not present

## 2018-04-05 DIAGNOSIS — E785 Hyperlipidemia, unspecified: Secondary | ICD-10-CM | POA: Diagnosis present

## 2018-04-05 DIAGNOSIS — T43592A Poisoning by other antipsychotics and neuroleptics, intentional self-harm, initial encounter: Secondary | ICD-10-CM | POA: Diagnosis present

## 2018-04-05 DIAGNOSIS — F603 Borderline personality disorder: Secondary | ICD-10-CM | POA: Diagnosis present

## 2018-04-05 DIAGNOSIS — T43212A Poisoning by selective serotonin and norepinephrine reuptake inhibitors, intentional self-harm, initial encounter: Secondary | ICD-10-CM | POA: Diagnosis present

## 2018-04-05 DIAGNOSIS — T43022A Poisoning by tetracyclic antidepressants, intentional self-harm, initial encounter: Secondary | ICD-10-CM | POA: Diagnosis present

## 2018-04-05 DIAGNOSIS — D509 Iron deficiency anemia, unspecified: Secondary | ICD-10-CM | POA: Diagnosis present

## 2018-04-05 DIAGNOSIS — Z8589 Personal history of malignant neoplasm of other organs and systems: Secondary | ICD-10-CM | POA: Diagnosis not present

## 2018-04-05 DIAGNOSIS — I10 Essential (primary) hypertension: Secondary | ICD-10-CM | POA: Diagnosis not present

## 2018-04-05 DIAGNOSIS — I4581 Long QT syndrome: Secondary | ICD-10-CM | POA: Diagnosis present

## 2018-04-05 DIAGNOSIS — F259 Schizoaffective disorder, unspecified: Secondary | ICD-10-CM | POA: Diagnosis not present

## 2018-04-05 DIAGNOSIS — Z8614 Personal history of Methicillin resistant Staphylococcus aureus infection: Secondary | ICD-10-CM | POA: Diagnosis not present

## 2018-04-05 DIAGNOSIS — T1491XA Suicide attempt, initial encounter: Secondary | ICD-10-CM | POA: Diagnosis not present

## 2018-04-05 DIAGNOSIS — Z7901 Long term (current) use of anticoagulants: Secondary | ICD-10-CM | POA: Diagnosis not present

## 2018-04-05 DIAGNOSIS — T56892A Toxic effect of other metals, intentional self-harm, initial encounter: Secondary | ICD-10-CM | POA: Insufficient documentation

## 2018-04-05 DIAGNOSIS — Z880 Allergy status to penicillin: Secondary | ICD-10-CM | POA: Diagnosis not present

## 2018-04-05 DIAGNOSIS — E119 Type 2 diabetes mellitus without complications: Secondary | ICD-10-CM | POA: Diagnosis present

## 2018-04-05 DIAGNOSIS — T50902A Poisoning by unspecified drugs, medicaments and biological substances, intentional self-harm, initial encounter: Secondary | ICD-10-CM | POA: Diagnosis not present

## 2018-04-05 DIAGNOSIS — Z7984 Long term (current) use of oral hypoglycemic drugs: Secondary | ICD-10-CM | POA: Diagnosis not present

## 2018-04-05 DIAGNOSIS — F419 Anxiety disorder, unspecified: Secondary | ICD-10-CM | POA: Diagnosis present

## 2018-04-05 DIAGNOSIS — R45851 Suicidal ideations: Secondary | ICD-10-CM | POA: Diagnosis not present

## 2018-04-05 DIAGNOSIS — R440 Auditory hallucinations: Secondary | ICD-10-CM | POA: Diagnosis not present

## 2018-04-07 DIAGNOSIS — R440 Auditory hallucinations: Secondary | ICD-10-CM | POA: Diagnosis not present

## 2018-04-07 DIAGNOSIS — I499 Cardiac arrhythmia, unspecified: Secondary | ICD-10-CM | POA: Diagnosis not present

## 2018-04-07 DIAGNOSIS — I4581 Long QT syndrome: Secondary | ICD-10-CM | POA: Diagnosis not present

## 2018-04-07 DIAGNOSIS — T1491XA Suicide attempt, initial encounter: Secondary | ICD-10-CM | POA: Diagnosis not present

## 2018-04-07 DIAGNOSIS — T43212A Poisoning by selective serotonin and norepinephrine reuptake inhibitors, intentional self-harm, initial encounter: Secondary | ICD-10-CM | POA: Diagnosis present

## 2018-04-07 DIAGNOSIS — T50902A Poisoning by unspecified drugs, medicaments and biological substances, intentional self-harm, initial encounter: Secondary | ICD-10-CM | POA: Diagnosis not present

## 2018-04-07 DIAGNOSIS — F259 Schizoaffective disorder, unspecified: Secondary | ICD-10-CM | POA: Diagnosis not present

## 2018-04-07 DIAGNOSIS — F329 Major depressive disorder, single episode, unspecified: Secondary | ICD-10-CM | POA: Diagnosis not present

## 2018-04-07 DIAGNOSIS — F419 Anxiety disorder, unspecified: Secondary | ICD-10-CM | POA: Diagnosis not present

## 2018-04-07 DIAGNOSIS — T50902S Poisoning by unspecified drugs, medicaments and biological substances, intentional self-harm, sequela: Secondary | ICD-10-CM | POA: Diagnosis not present

## 2018-04-07 DIAGNOSIS — E119 Type 2 diabetes mellitus without complications: Secondary | ICD-10-CM | POA: Diagnosis present

## 2018-04-07 DIAGNOSIS — E785 Hyperlipidemia, unspecified: Secondary | ICD-10-CM | POA: Diagnosis present

## 2018-04-07 DIAGNOSIS — G4700 Insomnia, unspecified: Secondary | ICD-10-CM | POA: Diagnosis not present

## 2018-04-07 DIAGNOSIS — R45851 Suicidal ideations: Secondary | ICD-10-CM | POA: Diagnosis not present

## 2018-04-07 DIAGNOSIS — I10 Essential (primary) hypertension: Secondary | ICD-10-CM | POA: Diagnosis not present

## 2018-04-07 DIAGNOSIS — R569 Unspecified convulsions: Secondary | ICD-10-CM | POA: Diagnosis not present

## 2018-04-07 DIAGNOSIS — F4310 Post-traumatic stress disorder, unspecified: Secondary | ICD-10-CM | POA: Diagnosis not present

## 2018-04-07 DIAGNOSIS — Z880 Allergy status to penicillin: Secondary | ICD-10-CM | POA: Diagnosis not present

## 2018-04-07 DIAGNOSIS — R9431 Abnormal electrocardiogram [ECG] [EKG]: Secondary | ICD-10-CM | POA: Diagnosis not present

## 2018-04-07 DIAGNOSIS — F603 Borderline personality disorder: Secondary | ICD-10-CM | POA: Diagnosis not present

## 2018-04-07 DIAGNOSIS — F39 Unspecified mood [affective] disorder: Secondary | ICD-10-CM | POA: Diagnosis not present

## 2018-04-17 DIAGNOSIS — F251 Schizoaffective disorder, depressive type: Secondary | ICD-10-CM | POA: Diagnosis not present

## 2018-04-19 DIAGNOSIS — I10 Essential (primary) hypertension: Secondary | ICD-10-CM | POA: Diagnosis not present

## 2018-04-19 DIAGNOSIS — Z590 Homelessness: Secondary | ICD-10-CM | POA: Diagnosis not present

## 2018-04-19 DIAGNOSIS — E7800 Pure hypercholesterolemia, unspecified: Secondary | ICD-10-CM | POA: Diagnosis not present

## 2018-04-19 DIAGNOSIS — E119 Type 2 diabetes mellitus without complications: Secondary | ICD-10-CM | POA: Diagnosis not present

## 2018-04-19 DIAGNOSIS — F259 Schizoaffective disorder, unspecified: Secondary | ICD-10-CM | POA: Diagnosis not present

## 2018-04-19 DIAGNOSIS — F251 Schizoaffective disorder, depressive type: Secondary | ICD-10-CM | POA: Diagnosis not present

## 2018-04-20 DIAGNOSIS — F259 Schizoaffective disorder, unspecified: Secondary | ICD-10-CM | POA: Diagnosis not present

## 2018-04-20 DIAGNOSIS — K219 Gastro-esophageal reflux disease without esophagitis: Secondary | ICD-10-CM | POA: Diagnosis not present

## 2018-04-20 DIAGNOSIS — I10 Essential (primary) hypertension: Secondary | ICD-10-CM | POA: Diagnosis not present

## 2018-04-20 DIAGNOSIS — E119 Type 2 diabetes mellitus without complications: Secondary | ICD-10-CM | POA: Diagnosis not present

## 2018-04-20 DIAGNOSIS — Z9884 Bariatric surgery status: Secondary | ICD-10-CM | POA: Diagnosis not present

## 2018-04-20 DIAGNOSIS — F251 Schizoaffective disorder, depressive type: Secondary | ICD-10-CM | POA: Diagnosis not present

## 2018-04-20 DIAGNOSIS — E7800 Pure hypercholesterolemia, unspecified: Secondary | ICD-10-CM | POA: Diagnosis not present

## 2018-04-20 DIAGNOSIS — Z915 Personal history of self-harm: Secondary | ICD-10-CM | POA: Diagnosis not present

## 2018-04-20 DIAGNOSIS — Z9114 Patient's other noncompliance with medication regimen: Secondary | ICD-10-CM | POA: Diagnosis not present

## 2018-04-20 DIAGNOSIS — Z590 Homelessness: Secondary | ICD-10-CM | POA: Diagnosis not present

## 2018-04-20 DIAGNOSIS — F411 Generalized anxiety disorder: Secondary | ICD-10-CM | POA: Diagnosis not present

## 2018-04-20 DIAGNOSIS — F603 Borderline personality disorder: Secondary | ICD-10-CM | POA: Diagnosis not present

## 2018-04-26 DIAGNOSIS — F251 Schizoaffective disorder, depressive type: Secondary | ICD-10-CM | POA: Diagnosis not present

## 2018-04-27 DIAGNOSIS — F29 Unspecified psychosis not due to a substance or known physiological condition: Secondary | ICD-10-CM | POA: Diagnosis not present

## 2018-05-03 DIAGNOSIS — Z79899 Other long term (current) drug therapy: Secondary | ICD-10-CM | POA: Diagnosis not present

## 2018-05-04 DIAGNOSIS — F29 Unspecified psychosis not due to a substance or known physiological condition: Secondary | ICD-10-CM | POA: Diagnosis not present

## 2018-05-06 DIAGNOSIS — I499 Cardiac arrhythmia, unspecified: Secondary | ICD-10-CM | POA: Diagnosis not present

## 2018-05-06 DIAGNOSIS — Z5321 Procedure and treatment not carried out due to patient leaving prior to being seen by health care provider: Secondary | ICD-10-CM | POA: Diagnosis not present

## 2018-05-06 DIAGNOSIS — R42 Dizziness and giddiness: Secondary | ICD-10-CM | POA: Diagnosis not present

## 2018-05-09 ENCOUNTER — Other Ambulatory Visit: Payer: Self-pay

## 2018-05-09 NOTE — Patient Outreach (Signed)
[facility]) Care Management  05/09/2018  [name] [name] 04-05-1971 [id number]   Referral Date: 05/08/18 Referral Source: [facility] Referral Reason: Heart Palpitations, tired, and dizziness   Outreach Attempt #1 Spoke with patient.  She is able to verify HIPAA.  Patient reports she is doing ok.  Advised patient on reason for call.  Patient reports she was having some problems with heart palpitations and wanted to lie down.  Patient states she went to the ER but they were busy so she left.  She states she called [facility] and her medication was decreased and she feels better.  Patient reports she has a follow up appointment with [facility] in June and that she has transportation to her appointment.  Patient reports that she has all her medications and offers no concerns.     Plan: RN CM will close case.     Jone Baseman, RN, MSN [facility] Care Management Care Management Coordinator Direct Line [phone] Toll Free: [phone]  Fax: [phone]

## 2018-05-10 DIAGNOSIS — N1330 Unspecified hydronephrosis: Secondary | ICD-10-CM | POA: Diagnosis not present

## 2018-05-10 DIAGNOSIS — I10 Essential (primary) hypertension: Secondary | ICD-10-CM | POA: Diagnosis not present

## 2018-05-10 DIAGNOSIS — Z86711 Personal history of pulmonary embolism: Secondary | ICD-10-CM | POA: Diagnosis not present

## 2018-05-10 DIAGNOSIS — N3000 Acute cystitis without hematuria: Secondary | ICD-10-CM | POA: Diagnosis not present

## 2018-05-10 DIAGNOSIS — E7800 Pure hypercholesterolemia, unspecified: Secondary | ICD-10-CM | POA: Diagnosis not present

## 2018-05-11 DIAGNOSIS — D352 Benign neoplasm of pituitary gland: Secondary | ICD-10-CM | POA: Diagnosis not present

## 2018-05-11 DIAGNOSIS — E221 Hyperprolactinemia: Secondary | ICD-10-CM | POA: Diagnosis not present

## 2018-05-11 DIAGNOSIS — Z86711 Personal history of pulmonary embolism: Secondary | ICD-10-CM | POA: Diagnosis not present

## 2018-05-11 DIAGNOSIS — N911 Secondary amenorrhea: Secondary | ICD-10-CM | POA: Diagnosis not present

## 2018-05-11 DIAGNOSIS — F29 Unspecified psychosis not due to a substance or known physiological condition: Secondary | ICD-10-CM | POA: Diagnosis not present

## 2018-05-18 DIAGNOSIS — K5730 Diverticulosis of large intestine without perforation or abscess without bleeding: Secondary | ICD-10-CM | POA: Diagnosis not present

## 2018-05-18 DIAGNOSIS — I10 Essential (primary) hypertension: Secondary | ICD-10-CM | POA: Diagnosis not present

## 2018-05-18 DIAGNOSIS — N200 Calculus of kidney: Secondary | ICD-10-CM | POA: Diagnosis not present

## 2018-05-18 DIAGNOSIS — Z888 Allergy status to other drugs, medicaments and biological substances status: Secondary | ICD-10-CM | POA: Diagnosis not present

## 2018-05-18 DIAGNOSIS — R112 Nausea with vomiting, unspecified: Secondary | ICD-10-CM | POA: Diagnosis not present

## 2018-05-18 DIAGNOSIS — G8929 Other chronic pain: Secondary | ICD-10-CM | POA: Diagnosis not present

## 2018-05-18 DIAGNOSIS — Z880 Allergy status to penicillin: Secondary | ICD-10-CM | POA: Diagnosis not present

## 2018-05-18 DIAGNOSIS — E785 Hyperlipidemia, unspecified: Secondary | ICD-10-CM | POA: Diagnosis not present

## 2018-05-18 DIAGNOSIS — R1031 Right lower quadrant pain: Secondary | ICD-10-CM | POA: Diagnosis not present

## 2018-05-18 DIAGNOSIS — K439 Ventral hernia without obstruction or gangrene: Secondary | ICD-10-CM | POA: Diagnosis not present

## 2018-05-18 DIAGNOSIS — F419 Anxiety disorder, unspecified: Secondary | ICD-10-CM | POA: Diagnosis not present

## 2018-05-18 DIAGNOSIS — F29 Unspecified psychosis not due to a substance or known physiological condition: Secondary | ICD-10-CM | POA: Diagnosis not present

## 2018-05-18 DIAGNOSIS — Z79899 Other long term (current) drug therapy: Secondary | ICD-10-CM | POA: Diagnosis not present

## 2018-05-18 DIAGNOSIS — K219 Gastro-esophageal reflux disease without esophagitis: Secondary | ICD-10-CM | POA: Diagnosis not present

## 2018-05-18 DIAGNOSIS — R1011 Right upper quadrant pain: Secondary | ICD-10-CM | POA: Diagnosis not present

## 2018-05-18 DIAGNOSIS — R109 Unspecified abdominal pain: Secondary | ICD-10-CM | POA: Diagnosis not present

## 2018-05-18 DIAGNOSIS — N281 Cyst of kidney, acquired: Secondary | ICD-10-CM | POA: Diagnosis not present

## 2018-05-25 DIAGNOSIS — F29 Unspecified psychosis not due to a substance or known physiological condition: Secondary | ICD-10-CM | POA: Diagnosis not present

## 2018-05-26 ENCOUNTER — Emergency Department (HOSPITAL_COMMUNITY)
Admission: EM | Admit: 2018-05-26 | Discharge: 2018-05-26 | Disposition: A | Discharge status: Home / Self Care | Payer: Medicare Other | Attending: Emergency Medicine | Admitting: Emergency Medicine

## 2018-05-26 ENCOUNTER — Other Ambulatory Visit: Payer: Self-pay

## 2018-05-26 ENCOUNTER — Encounter (HOSPITAL_COMMUNITY): Payer: Self-pay

## 2018-05-26 ENCOUNTER — Emergency Department (HOSPITAL_COMMUNITY): Payer: Medicare Other

## 2018-05-26 DIAGNOSIS — R1032 Left lower quadrant pain: Secondary | ICD-10-CM | POA: Diagnosis not present

## 2018-05-26 DIAGNOSIS — Z86711 Personal history of pulmonary embolism: Secondary | ICD-10-CM | POA: Diagnosis not present

## 2018-05-26 DIAGNOSIS — Z79899 Other long term (current) drug therapy: Secondary | ICD-10-CM | POA: Diagnosis not present

## 2018-05-26 DIAGNOSIS — N1330 Unspecified hydronephrosis: Secondary | ICD-10-CM | POA: Diagnosis not present

## 2018-05-26 DIAGNOSIS — Z7984 Long term (current) use of oral hypoglycemic drugs: Secondary | ICD-10-CM | POA: Diagnosis not present

## 2018-05-26 DIAGNOSIS — I10 Essential (primary) hypertension: Secondary | ICD-10-CM | POA: Diagnosis not present

## 2018-05-26 DIAGNOSIS — K5732 Diverticulitis of large intestine without perforation or abscess without bleeding: Secondary | ICD-10-CM

## 2018-05-26 DIAGNOSIS — Z8679 Personal history of other diseases of the circulatory system: Secondary | ICD-10-CM | POA: Insufficient documentation

## 2018-05-26 LAB — URINALYSIS, ROUTINE W REFLEX MICROSCOPIC
BACTERIA UA: NONE SEEN
Bilirubin Urine: NEGATIVE
Glucose, UA: NEGATIVE mg/dL
Ketones, ur: NEGATIVE mg/dL
LEUKOCYTES UA: NEGATIVE
Nitrite: NEGATIVE
PROTEIN: NEGATIVE mg/dL
Specific Gravity, Urine: 1.005 (ref 1.005–1.030)
pH: 6 (ref 5.0–8.0)

## 2018-05-26 LAB — COMPREHENSIVE METABOLIC PANEL
ALT: 10 U/L — AB (ref 14–54)
ANION GAP: 8 (ref 5–15)
AST: 14 U/L — ABNORMAL LOW (ref 15–41)
Albumin: 3.9 g/dL (ref 3.5–5.0)
Alkaline Phosphatase: 95 U/L (ref 38–126)
BUN: 9 mg/dL (ref 6–20)
CO2: 26 mmol/L (ref 22–32)
CREATININE: 0.96 mg/dL (ref 0.44–1.00)
Calcium: 9.2 mg/dL (ref 8.9–10.3)
Chloride: 106 mmol/L (ref 101–111)
Glucose, Bld: 84 mg/dL (ref 65–99)
POTASSIUM: 3.9 mmol/L (ref 3.5–5.1)
SODIUM: 140 mmol/L (ref 135–145)
Total Bilirubin: 0.6 mg/dL (ref 0.3–1.2)
Total Protein: 6.5 g/dL (ref 6.5–8.1)

## 2018-05-26 LAB — CBC
HEMATOCRIT: 37.6 % (ref 36.0–46.0)
HEMOGLOBIN: 11.7 g/dL — AB (ref 12.0–15.0)
MCH: 26.1 pg (ref 26.0–34.0)
MCHC: 31.1 g/dL (ref 30.0–36.0)
MCV: 83.9 fL (ref 78.0–100.0)
PLATELETS: 270 10*3/uL (ref 150–400)
RBC: 4.48 MIL/uL (ref 3.87–5.11)
RDW: 13.7 % (ref 11.5–15.5)
WBC: 8.3 10*3/uL (ref 4.0–10.5)

## 2018-05-26 LAB — LIPASE, BLOOD: LIPASE: 37 U/L (ref 11–51)

## 2018-05-26 LAB — I-STAT BETA HCG BLOOD, ED (MC, WL, AP ONLY)

## 2018-05-26 MED ORDER — METRONIDAZOLE 500 MG PO TABS: 500.0000 mg | ORAL_TABLET | Freq: Two times a day (BID) | ORAL | 0 refills | Status: DC

## 2018-05-26 MED ORDER — METRONIDAZOLE 500 MG PO TABS
500.0000 mg | ORAL_TABLET | Freq: Once | ORAL | Status: AC
  Administered 2018-05-26: 500 mg via ORAL
  Filled 2018-05-26: qty 1

## 2018-05-26 MED ORDER — OXYCODONE-ACETAMINOPHEN 5-325 MG PO TABS: 1.0000 | ORAL_TABLET | Freq: Four times a day (QID) | ORAL | 0 refills | Status: DC | PRN

## 2018-05-26 MED ORDER — SULFAMETHOXAZOLE-TRIMETHOPRIM 800-160 MG PO TABS: 1.0000 | ORAL_TABLET | Freq: Two times a day (BID) | ORAL | 0 refills | Status: AC

## 2018-05-26 MED ORDER — SULFAMETHOXAZOLE-TRIMETHOPRIM 800-160 MG PO TABS
1.0000 | ORAL_TABLET | Freq: Once | ORAL | Status: AC
  Administered 2018-05-26: 1 via ORAL
  Filled 2018-05-26: qty 1

## 2018-05-26 MED ORDER — IOHEXOL 300 MG/ML  SOLN
100.0000 mL | Freq: Once | INTRAMUSCULAR | Status: AC | PRN
  Administered 2018-05-26: 100 mL via INTRAVENOUS
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
## 2018-05-26 NOTE — ED Provider Notes (Signed)
Patient placed in Quick Look pathway, seen and evaluated   Chief Complaint: abdominal pain, diarrhea  HPI:   Patient presents with 3 day history of constant left lower quadrant abdominal pain and diarrhea.  Pain is dull at rest but becomes sharp and intensifies with bending or palpation.  Denies melena, hematochezia, fevers, chills, nausea, vomiting.  She has tried ibuprofen without relief of her symptoms.  She also notes that she has had vaginal bleeding for the past week after not having a period for 1 year.  Denies vaginal itching or abnormal discharge.  She has had numerous abdominal surgeries.  She has tried ibuprofen without relief of her symptoms.   ROS: Positive for abdominal pain, diarrhea, urinary frequency, vaginal bleeding negative for fevers, vomiting, nausea, hematuria, melena  Physical Exam:   Gen: No distress  Neuro: Awake and Alert  Skin: Warm    Focused Exam: Multiple well-healed surgical scars noted to the abdomen.  She has tenderness to palpation of the left lower quadrant, no rebound.  Guarding is present.  Murphy sign absent, Rovsing's absent, no CVA tenderness.  Lungs clear to auscultation bilaterally.  She is mildly tachycardic.   Initiation of care has begun. The patient has been counseled on the process, plan, and necessity for staying for the completion/evaluation, and the remainder of the medical screening examination    Renita Papa, PA-C 05/26/18 1508    Little, Wenda Overland, MD 05/26/18 [phone]

## 2018-05-26 NOTE — ED Triage Notes (Signed)
Patient complains of 3 days of LLQ pain with diarrhea. Instructed to come to ED by her MD for further evaluation. No nausea, no vomiting. No fever

## 2018-05-31 DIAGNOSIS — F603 Borderline personality disorder: Secondary | ICD-10-CM | POA: Diagnosis not present

## 2018-06-08 DIAGNOSIS — F29 Unspecified psychosis not due to a substance or known physiological condition: Secondary | ICD-10-CM | POA: Diagnosis not present

## 2018-06-15 DIAGNOSIS — F29 Unspecified psychosis not due to a substance or known physiological condition: Secondary | ICD-10-CM | POA: Diagnosis not present

## 2018-06-28 DIAGNOSIS — F251 Schizoaffective disorder, depressive type: Secondary | ICD-10-CM | POA: Diagnosis not present

## 2018-06-28 DIAGNOSIS — G5600 Carpal tunnel syndrome, unspecified upper limb: Secondary | ICD-10-CM | POA: Diagnosis not present

## 2018-07-06 DIAGNOSIS — F29 Unspecified psychosis not due to a substance or known physiological condition: Secondary | ICD-10-CM | POA: Diagnosis not present

## 2018-07-17 DIAGNOSIS — K5732 Diverticulitis of large intestine without perforation or abscess without bleeding: Secondary | ICD-10-CM | POA: Diagnosis not present

## 2018-07-27 DIAGNOSIS — F29 Unspecified psychosis not due to a substance or known physiological condition: Secondary | ICD-10-CM | POA: Diagnosis not present

## 2018-08-02 DIAGNOSIS — F603 Borderline personality disorder: Secondary | ICD-10-CM | POA: Diagnosis not present

## 2018-08-03 DIAGNOSIS — F29 Unspecified psychosis not due to a substance or known physiological condition: Secondary | ICD-10-CM | POA: Diagnosis not present

## 2018-08-05 ENCOUNTER — Emergency Department (HOSPITAL_COMMUNITY)
Admission: EM | Admit: 2018-08-05 | Discharge: 2018-08-05 | Disposition: A | Discharge status: Home / Self Care | Payer: Medicare Other | Attending: Emergency Medicine | Admitting: Emergency Medicine

## 2018-08-05 ENCOUNTER — Encounter (HOSPITAL_COMMUNITY): Payer: Self-pay | Admitting: Emergency Medicine

## 2018-08-05 ENCOUNTER — Emergency Department (HOSPITAL_COMMUNITY): Payer: Medicare Other

## 2018-08-05 ENCOUNTER — Other Ambulatory Visit: Payer: Self-pay

## 2018-08-05 DIAGNOSIS — R42 Dizziness and giddiness: Secondary | ICD-10-CM | POA: Insufficient documentation

## 2018-08-05 DIAGNOSIS — R079 Chest pain, unspecified: Secondary | ICD-10-CM | POA: Diagnosis not present

## 2018-08-05 DIAGNOSIS — R0789 Other chest pain: Secondary | ICD-10-CM | POA: Insufficient documentation

## 2018-08-05 DIAGNOSIS — J90 Pleural effusion, not elsewhere classified: Secondary | ICD-10-CM | POA: Diagnosis not present

## 2018-08-05 DIAGNOSIS — R110 Nausea: Secondary | ICD-10-CM | POA: Insufficient documentation

## 2018-08-05 DIAGNOSIS — R5383 Other fatigue: Secondary | ICD-10-CM | POA: Diagnosis not present

## 2018-08-05 DIAGNOSIS — I10 Essential (primary) hypertension: Secondary | ICD-10-CM | POA: Insufficient documentation

## 2018-08-05 DIAGNOSIS — M79602 Pain in left arm: Secondary | ICD-10-CM | POA: Diagnosis not present

## 2018-08-05 DIAGNOSIS — Z79899 Other long term (current) drug therapy: Secondary | ICD-10-CM | POA: Insufficient documentation

## 2018-08-05 DIAGNOSIS — Z85828 Personal history of other malignant neoplasm of skin: Secondary | ICD-10-CM | POA: Diagnosis not present

## 2018-08-05 LAB — BASIC METABOLIC PANEL
ANION GAP: 11 (ref 5–15)
BUN: 12 mg/dL (ref 6–20)
CALCIUM: 9.5 mg/dL (ref 8.9–10.3)
CO2: 24 mmol/L (ref 22–32)
Chloride: 103 mmol/L (ref 98–111)
Creatinine, Ser: 0.98 mg/dL (ref 0.44–1.00)
GFR calc Af Amer: >60 mL/min (ref >60)
GFR calc non Af Amer: >60 mL/min (ref >60)
GLUCOSE: 98 mg/dL (ref 70–99)
POTASSIUM: 4.2 mmol/L (ref 3.5–5.1)
Sodium: 138 mmol/L (ref 135–145)

## 2018-08-05 LAB — I-STAT BETA HCG BLOOD, ED (MC, WL, AP ONLY): I-stat hCG, quantitative: <5 m[IU]/mL (ref <5)

## 2018-08-05 LAB — CBC
HEMATOCRIT: 37.5 % (ref 36.0–46.0)
Hemoglobin: 11.7 g/dL — ABNORMAL LOW (ref 12.0–15.0)
MCH: 26.6 pg (ref 26.0–34.0)
MCHC: 31.2 g/dL (ref 30.0–36.0)
MCV: 85.2 fL (ref 78.0–100.0)
Platelets: 304 10*3/uL (ref 150–400)
RBC: 4.4 MIL/uL (ref 3.87–5.11)
RDW: 14.6 % (ref 11.5–15.5)
WBC: 6.9 10*3/uL (ref 4.0–10.5)

## 2018-08-05 LAB — I-STAT TROPONIN, ED: Troponin i, poc: 0 ng/mL (ref 0.00–0.08)

## 2018-08-05 LAB — D-DIMER, QUANTITATIVE (NOT AT ARMC): D DIMER QUANT: 0.57 ug{FEU}/mL — AB (ref 0.00–0.50)

## 2018-08-05 MED ORDER — IOPAMIDOL (ISOVUE-370) INJECTION 76%
100.0000 mL | Freq: Once | INTRAVENOUS | Status: AC | PRN
  Administered 2018-08-05: 100 mL via INTRAVENOUS

## 2018-08-05 MED ORDER — IOPAMIDOL (ISOVUE-370) INJECTION 76%
INTRAVENOUS | Status: AC
  Filled 2018-08-05: qty 100

## 2018-08-05 MED ORDER — MORPHINE SULFATE (PF) 4 MG/ML IV SOLN
4.0000 mg | Freq: Once | INTRAVENOUS | Status: AC
  Administered 2018-08-05: 4 mg via INTRAVENOUS
  Filled 2018-08-05: qty 1

## 2018-08-05 NOTE — Discharge Instructions (Addendum)
Please call and follow up closely with your primary care provider for further evaluation of your condition.  Return if you have any concerns.

## 2018-08-05 NOTE — ED Triage Notes (Signed)
Pt presents with CP with nausea, L arm pain, and fatigue that began last night; was seen at [facility] and sent here; pt describes pain as pressure and pinching pain; pt noted to have high BP in triage
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
## 2018-08-05 NOTE — ED Notes (Signed)
Patient transported to CT 

## 2018-08-05 NOTE — ED Notes (Signed)
ED Provider at bedside. 

## 2018-08-05 NOTE — ED Notes (Signed)
Pt returned from xray

## 2018-08-07 DIAGNOSIS — J90 Pleural effusion, not elsewhere classified: Secondary | ICD-10-CM | POA: Diagnosis not present

## 2018-08-17 DIAGNOSIS — F29 Unspecified psychosis not due to a substance or known physiological condition: Secondary | ICD-10-CM | POA: Diagnosis not present

## 2018-08-24 DIAGNOSIS — Z751 Person awaiting admission to adequate facility elsewhere: Secondary | ICD-10-CM | POA: Diagnosis not present

## 2018-08-24 DIAGNOSIS — F29 Unspecified psychosis not due to a substance or known physiological condition: Secondary | ICD-10-CM | POA: Diagnosis not present

## 2018-08-24 DIAGNOSIS — F4310 Post-traumatic stress disorder, unspecified: Secondary | ICD-10-CM | POA: Diagnosis not present

## 2018-08-24 DIAGNOSIS — Z79899 Other long term (current) drug therapy: Secondary | ICD-10-CM | POA: Diagnosis not present

## 2018-08-24 DIAGNOSIS — I10 Essential (primary) hypertension: Secondary | ICD-10-CM | POA: Diagnosis not present

## 2018-08-24 DIAGNOSIS — E119 Type 2 diabetes mellitus without complications: Secondary | ICD-10-CM | POA: Diagnosis not present

## 2018-08-24 DIAGNOSIS — F23 Brief psychotic disorder: Secondary | ICD-10-CM | POA: Diagnosis not present

## 2018-08-24 DIAGNOSIS — E7800 Pure hypercholesterolemia, unspecified: Secondary | ICD-10-CM | POA: Diagnosis not present

## 2018-08-25 ENCOUNTER — Other Ambulatory Visit: Payer: Self-pay

## 2018-08-25 ENCOUNTER — Encounter (HOSPITAL_COMMUNITY): Payer: Self-pay | Admitting: *Deleted

## 2018-08-25 ENCOUNTER — Inpatient Hospital Stay (HOSPITAL_COMMUNITY)
Admission: AD | Admit: 2018-08-25 | Discharge: 2018-08-29 | DRG: 885 | Disposition: A | Discharge status: Home / Self Care | Payer: Medicare Other | Attending: Psychiatry | Admitting: Psychiatry

## 2018-08-25 DIAGNOSIS — F4310 Post-traumatic stress disorder, unspecified: Secondary | ICD-10-CM | POA: Diagnosis not present

## 2018-08-25 DIAGNOSIS — Z888 Allergy status to other drugs, medicaments and biological substances status: Secondary | ICD-10-CM

## 2018-08-25 DIAGNOSIS — I10 Essential (primary) hypertension: Secondary | ICD-10-CM | POA: Diagnosis present

## 2018-08-25 DIAGNOSIS — G4700 Insomnia, unspecified: Secondary | ICD-10-CM | POA: Diagnosis not present

## 2018-08-25 DIAGNOSIS — E7800 Pure hypercholesterolemia, unspecified: Secondary | ICD-10-CM | POA: Diagnosis not present

## 2018-08-25 DIAGNOSIS — M1712 Unilateral primary osteoarthritis, left knee: Secondary | ICD-10-CM | POA: Diagnosis present

## 2018-08-25 DIAGNOSIS — F333 Major depressive disorder, recurrent, severe with psychotic symptoms: Principal | ICD-10-CM | POA: Diagnosis present

## 2018-08-25 DIAGNOSIS — K219 Gastro-esophageal reflux disease without esophagitis: Secondary | ICD-10-CM | POA: Diagnosis present

## 2018-08-25 DIAGNOSIS — Z85828 Personal history of other malignant neoplasm of skin: Secondary | ICD-10-CM | POA: Diagnosis not present

## 2018-08-25 DIAGNOSIS — Z881 Allergy status to other antibiotic agents status: Secondary | ICD-10-CM | POA: Diagnosis not present

## 2018-08-25 DIAGNOSIS — Z86711 Personal history of pulmonary embolism: Secondary | ICD-10-CM | POA: Diagnosis not present

## 2018-08-25 DIAGNOSIS — Z9884 Bariatric surgery status: Secondary | ICD-10-CM | POA: Diagnosis not present

## 2018-08-25 DIAGNOSIS — Z8249 Family history of ischemic heart disease and other diseases of the circulatory system: Secondary | ICD-10-CM | POA: Diagnosis not present

## 2018-08-25 DIAGNOSIS — F23 Brief psychotic disorder: Secondary | ICD-10-CM | POA: Diagnosis not present

## 2018-08-25 DIAGNOSIS — F259 Schizoaffective disorder, unspecified: Secondary | ICD-10-CM | POA: Diagnosis not present

## 2018-08-25 DIAGNOSIS — F251 Schizoaffective disorder, depressive type: Secondary | ICD-10-CM | POA: Diagnosis not present

## 2018-08-25 DIAGNOSIS — E785 Hyperlipidemia, unspecified: Secondary | ICD-10-CM | POA: Diagnosis present

## 2018-08-25 DIAGNOSIS — R451 Restlessness and agitation: Secondary | ICD-10-CM | POA: Diagnosis not present

## 2018-08-25 DIAGNOSIS — Z87442 Personal history of urinary calculi: Secondary | ICD-10-CM | POA: Diagnosis not present

## 2018-08-25 DIAGNOSIS — Z915 Personal history of self-harm: Secondary | ICD-10-CM | POA: Diagnosis not present

## 2018-08-25 DIAGNOSIS — Z880 Allergy status to penicillin: Secondary | ICD-10-CM

## 2018-08-25 DIAGNOSIS — Z8051 Family history of malignant neoplasm of kidney: Secondary | ICD-10-CM

## 2018-08-25 DIAGNOSIS — F250 Schizoaffective disorder, bipolar type: Secondary | ICD-10-CM | POA: Diagnosis not present

## 2018-08-25 DIAGNOSIS — Z8614 Personal history of Methicillin resistant Staphylococcus aureus infection: Secondary | ICD-10-CM | POA: Diagnosis not present

## 2018-08-25 DIAGNOSIS — R45851 Suicidal ideations: Secondary | ICD-10-CM | POA: Diagnosis not present

## 2018-08-25 DIAGNOSIS — Z818 Family history of other mental and behavioral disorders: Secondary | ICD-10-CM

## 2018-08-25 DIAGNOSIS — Z79899 Other long term (current) drug therapy: Secondary | ICD-10-CM | POA: Diagnosis not present

## 2018-08-25 DIAGNOSIS — E119 Type 2 diabetes mellitus without complications: Secondary | ICD-10-CM | POA: Diagnosis not present

## 2018-08-25 DIAGNOSIS — F419 Anxiety disorder, unspecified: Secondary | ICD-10-CM | POA: Diagnosis not present

## 2018-08-25 MED ORDER — ACETAMINOPHEN 325 MG PO TABS: 650.0000 mg | ORAL_TABLET | Freq: Four times a day (QID) | ORAL | Status: DC | PRN

## 2018-08-25 MED ORDER — DIPHENHYDRAMINE HCL 25 MG PO CAPS
50.0000 mg | ORAL_CAPSULE | Freq: Four times a day (QID) | ORAL | Status: DC | PRN
  Administered 2018-08-27: 50 mg via ORAL
  Filled 2018-08-25 (×2): qty 2

## 2018-08-25 MED ORDER — HALOPERIDOL 5 MG PO TABS: 5.0000 mg | ORAL_TABLET | Freq: Four times a day (QID) | ORAL | Status: DC | PRN

## 2018-08-25 MED ORDER — TRAZODONE HCL 50 MG PO TABS
50.0000 mg | ORAL_TABLET | Freq: Every evening | ORAL | Status: DC | PRN
  Administered 2018-08-25: 50 mg via ORAL
  Filled 2018-08-25: qty 1

## 2018-08-25 MED ORDER — PALIPERIDONE PALMITATE ER 234 MG/1.5ML IM SUSY: 234.0000 mg | PREFILLED_SYRINGE | INTRAMUSCULAR | Status: DC

## 2018-08-25 MED ORDER — ALUM & MAG HYDROXIDE-SIMETH 200-200-20 MG/5ML PO SUSP: 30.0000 mL | ORAL | Status: DC | PRN

## 2018-08-25 MED ORDER — HALOPERIDOL LACTATE 5 MG/ML IJ SOLN: 5.0000 mg | Freq: Four times a day (QID) | INTRAMUSCULAR | Status: DC | PRN

## 2018-08-25 MED ORDER — LORAZEPAM 2 MG/ML IJ SOLN: 2.0000 mg | Freq: Four times a day (QID) | INTRAMUSCULAR | Status: DC | PRN

## 2018-08-25 MED ORDER — HYDROXYZINE HCL 25 MG PO TABS
25.0000 mg | ORAL_TABLET | Freq: Three times a day (TID) | ORAL | Status: DC | PRN
  Administered 2018-08-25 – 2018-08-26 (×2): 25 mg via ORAL
  Filled 2018-08-25 (×2): qty 1

## 2018-08-25 MED ORDER — DIPHENHYDRAMINE HCL 50 MG/ML IJ SOLN: 50.0000 mg | Freq: Four times a day (QID) | INTRAMUSCULAR | Status: DC | PRN

## 2018-08-25 MED ORDER — LORAZEPAM 1 MG PO TABS
2.0000 mg | ORAL_TABLET | Freq: Four times a day (QID) | ORAL | Status: DC | PRN
  Administered 2018-08-25 – 2018-08-28 (×4): 2 mg via ORAL
  Filled 2018-08-25 (×3): qty 2

## 2018-08-25 MED ORDER — MAGNESIUM HYDROXIDE 400 MG/5ML PO SUSP: 30.0000 mL | Freq: Every day | ORAL | Status: DC | PRN

## 2018-08-25 NOTE — Progress Notes (Signed)
Patient ID: [name] [name], female   DOB: 06-Oct-1971, 46 y.o.   MRN: [id number] PER STATE REGULATIONS 482.30  THIS CHART WAS REVIEWED FOR MEDICAL NECESSITY WITH RESPECT TO THE PATIENT'S ADMISSION/DURATION OF STAY.  NEXT REVIEW DATE: 08/29/18  Roma Schanz, RN, BSN CASE MANAGER
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
## 2018-08-25 NOTE — BHH Group Notes (Signed)
Adult Psychoeducational Group Note  Date:  08/25/2018 Time:  9:08 PM  Group Topic/Focus:  Wrap-Up Group:   The focus of this group is to help patients review their daily goal of treatment and discuss progress on daily workbooks.  Participation Level:  None  Participation Quality:  Supportive  Affect:  Flat  Cognitive:  Lacking  Insight: None  Engagement in Group:  None  Modes of Intervention:  Discussion and Support  Additional Comments:  Pt attended but opted out of participated in group this evening.   Cristi Loron 08/25/2018, 9:08 PM

## 2018-08-25 NOTE — Plan of Care (Signed)
Nurse discussed anxiety, depression, coping skills with patient. 
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
## 2018-08-25 NOTE — Progress Notes (Signed)
EKG COMPLETED AND PUT ON FRONT OF PATIENT'S CHART.

## 2018-08-26 DIAGNOSIS — F251 Schizoaffective disorder, depressive type: Secondary | ICD-10-CM

## 2018-08-26 DIAGNOSIS — R45851 Suicidal ideations: Secondary | ICD-10-CM

## 2018-08-26 MED ORDER — TRAZODONE HCL 100 MG PO TABS
100.0000 mg | ORAL_TABLET | Freq: Every evening | ORAL | Status: DC | PRN
  Administered 2018-08-26: 100 mg via ORAL
  Filled 2018-08-26: qty 1
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
## 2018-08-26 NOTE — Progress Notes (Signed)
DAR NOTE: Patient presents with flat affect and depressed mood. Pt has been isolative, most of the time in the room. Pt has guarded and minimizing, when asked a question, just by respond with yes, no answer.  Pt reports good night sleep, good appetite, normal energy, and good concentration. Denies pain, auditory and visual hallucinations.  Rates depression at 5, hopelessness at 5, and anxiety at 5.  Maintained on routine safety checks.  Medications given as prescribed.  Support and encouragement offered as needed.  Attended group and participated.  States goal for today is " home."  Will continue to monitor.

## 2018-08-26 NOTE — [facility] Group Notes (Signed)
  [facility] LCSW Group Therapy Note  Date/Time:  08/26/2018 11:15AM-12:00PM  Type of Therapy and Topic:  Group Therapy:  Feelings About Hospitalization  Participation Level:  Minimal   Description of Group This process group involved patients discussing their feelings related to being hospitalized, as well as the benefits they see to being in the hospital.  These feelings and benefits were itemized.  The group then brainstormed specific ways in which they could seek those same benefits when they discharge and return home.  Therapeutic Goals 1. Patient will identify and describe positive and negative feelings related to hospitalization 2. Patient will verbalize benefits of hospitalization to themselves personally 3. Patients will brainstorm together ways they can obtain similar benefits in the outpatient setting, identify barriers to wellness and possible solutions  Summary of Patient Progress:  The patient expressed her primary feelings about being hospitalized are "I don't know" and said she could not identify even whether she is having negative or positive feelings.  She appeared to have thought blocking whenever asked questions.  She did finally say she needs medications changed.  She had little eye contact.  Therapeutic Modalities Cognitive Behavioral Therapy Motivational Interviewing    Selmer Dominion, LCSW 08/26/2018, 12:29 PM

## 2018-08-26 NOTE — Plan of Care (Signed)
  Problem: Activity: Goal: Interest or engagement in activities will improve Outcome: Progressing   Problem: Coping: Goal: Ability to verbalize frustrations and anger appropriately will improve Outcome: Progressing   Problem: Safety: Goal: Periods of time without injury will increase Outcome: Progressing

## 2018-08-26 NOTE — Progress Notes (Signed)
Patient ID: [name] [name], female   DOB: [birth year], 47 y.o.   MRN: [id number] DAR Note: Pt with very flat and sad affect observed in the dayroom not interacting. Pt endorsed anxiety, suspiciousness and AVH; "the voices are still very much there." Pt denied pain, SI/HI or pain. Pt forward very little. All patient's questions and concerns addressed. Support, encouragement, and safe environment provided. 15-minute safety checks continue.  Pt did attend wrap-up group.
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
## 2018-08-26 NOTE — Progress Notes (Signed)
D: Pt denies SI/HI/AVH. Pt is pleasant and cooperative. Pt stated she was feeling better. Pt presents paranoid at times, pt stated she takes Prozac and Risperdal at night " that's why I'm having trouble sleeping"   A: Pt was offered support and encouragement. Pt was given scheduled medications. Pt was encourage to attend groups. Q 15 minute checks were done for safety.   R:. Pt has no complaints.Pt receptive to treatment and safety maintained on unit.   Problem: Education: Goal: Emotional status will improve Outcome: Progressing   Problem: Education: Goal: Mental status will improve Outcome: Progressing   Problem: Activity: Goal: Sleeping patterns will improve Outcome: Progressing   Problem: Coping: Goal: Ability to demonstrate self-control will improve Outcome: Progressing   Problem: Health Behavior/Discharge Planning: Goal: Compliance with treatment plan for underlying cause of condition will improve Outcome: Progressing
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
## 2018-08-26 NOTE — BHH Group Notes (Signed)
[place] Group Notes:  (Nursing/MHT/Case Management/Adjunct)  Date:  08/26/2018  Time:  5:46 PM  Type of Therapy:  Psychoeducational Skills  Participation Level:  Active  Participation Quality:  Appropriate  Affect:  Appropriate  Cognitive:  Appropriate  Insight:  Appropriate  Engagement in Group:  Engaged  Modes of Intervention:  Problem-solving  Wolfgang Phoenix 08/26/2018, 5:46 PM

## 2018-08-26 NOTE — B[facility] Group Notes (Signed)
Adult Psychoeducational Group Note  Date:  08/26/2018 Time:  9:26 PM  Group Topic/Focus:  Wrap-Up Group:   The focus of this group is to help patients review their daily goal of treatment and discuss progress on daily workbooks.  Participation Level:  Active  Participation Quality:  Appropriate and Attentive  Affect:  Appropriate  Cognitive:  Alert and Appropriate  Insight: Appropriate and Good  Engagement in Group:  Engaged  Modes of Intervention:  Discussion and Education  Additional Comments:  Pt attended and participated in wrap up group this evening. Pt rated their day a 8/10, due to them getting out of their room more. Pt completed their goal, which was to get to know people more.    Cristi Loron 08/26/2018, 9:26 PM
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
## 2018-08-27 DIAGNOSIS — Z818 Family history of other mental and behavioral disorders: Secondary | ICD-10-CM

## 2018-08-27 DIAGNOSIS — F259 Schizoaffective disorder, unspecified: Secondary | ICD-10-CM

## 2018-08-27 DIAGNOSIS — G4700 Insomnia, unspecified: Secondary | ICD-10-CM
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
## 2018-08-27 NOTE — Progress Notes (Signed)
DAR NOTE: Patient presents with flat affect and depressed mood.  Pt has been in the room most of the am, attended 10 am group late, but has been visible in the milieu since then. Pt is still flat and quite, and not sharing much. Pt did report god sleep, good appetite, normal energy, and good concentration. Denies pain, auditory and visual hallucinations.  Rates depression at 0, hopelessness at 0, and anxiety at 0.  Maintained on routine safety checks.  Medications given as prescribed.  Support and encouragement offered as needed.  Attended group and participated.  States goal for today is "To talk to my mom."  Will continue to monitor.

## 2018-08-27 NOTE — Plan of Care (Signed)

## 2018-08-27 NOTE — [facility] Group Notes (Signed)
[facility] Group Notes:  (Nursing)  Date:  08/27/2018  Time:10 am Type of Therapy:  Nurse Education  Participation Level:  Active  Participation Quality:  Appropriate  Affect:  Appropriate  Cognitive:  Appropriate  Insight:  Appropriate  Engagement in Group:  Engaged  Modes of Intervention:  Discussion and Education  Summary of Progress/Problems: Patient was late to group, but participated appropriately and was engaged in discussion upon arrival. Nurse led group played a non competitive learning/communication board game that fosters listening skills as well as self expression. Waymond Cera 08/27/2018, 1:03 PM

## 2018-08-27 NOTE — Progress Notes (Signed)
  D: Pt denies SI/HI/AVH. Pt is pleasant and cooperative. Pt stated she was doing ok this evening, pt had brighter affect on approach and appeared happier than yesterday.   A: Pt was offered support and encouragement. Pt was given scheduled medications. Pt was encourage to attend groups. Q 15 minute checks were done for safety.   R:Pt attends groups and interacts well with peers and staff. Pt is taking medication. Pt has no complaints.Pt receptive to treatment and safety maintained on unit.  Problem: Education: Goal: Knowledge of [place] [facility] Education information/materials will improve Outcome: Progressing   Problem: Education: Goal: Emotional status will improve Outcome: Progressing   Problem: Education: Goal: Mental status will improve Outcome: Progressing   Problem: Education: Goal: Verbalization of understanding the information provided will improve Outcome: Progressing   Problem: Activity: Goal: Interest or engagement in activities will improve Outcome: Progressing   Problem: Activity: Goal: Sleeping patterns will improve Outcome: Progressing   Problem: Coping: Goal: Ability to demonstrate self-control will improve Outcome: Progressing

## 2018-08-27 NOTE — [facility] Group Notes (Signed)
[facility] LCSW Group Therapy Note  Date/Time:  08/27/2018  11:00AM-12:00PM  Type of Therapy and Topic:  Group Therapy:  Music and Mood  Participation Level:  Active   Description of Group: In this process group, members listened to a variety of genres of music and identified that different types of music evoke different responses.  Patients were encouraged to identify music that was soothing for them and music that was energizing for them.  Patients discussed how this knowledge can help with wellness and recovery in various ways including managing depression and anxiety as well as encouraging healthy sleep habits.    Therapeutic Goals: 1. Patients will explore the impact of different varieties of music on mood 2. Patients will verbalize the thoughts they have when listening to different types of music 3. Patients will identify music that is soothing to them as well as music that is energizing to them 4. Patients will discuss how to use this knowledge to assist in maintaining wellness and recovery 5. Patients will explore the use of music as a coping skill  Summary of Patient Progress:  At the beginning of group, patient expressed that she felt good today and at the end of group said the music was "a release."  Therapeutic Modalities: Solution Focused Brief Therapy Activity   Selmer Dominion, LCSW

## 2018-08-28 ENCOUNTER — Other Ambulatory Visit: Payer: Self-pay

## 2018-08-28 DIAGNOSIS — F419 Anxiety disorder, unspecified: Secondary | ICD-10-CM

## 2018-08-28 DIAGNOSIS — R451 Restlessness and agitation: Secondary | ICD-10-CM

## 2018-08-28 DIAGNOSIS — F250 Schizoaffective disorder, bipolar type: Secondary | ICD-10-CM

## 2018-08-28 MED ORDER — DIPHENHYDRAMINE HCL 25 MG PO CAPS
50.0000 mg | ORAL_CAPSULE | Freq: Every evening | ORAL | Status: DC | PRN
  Administered 2018-08-28: 50 mg via ORAL

## 2018-08-28 NOTE — Tx Team (Signed)
Interdisciplinary Treatment and Diagnostic Plan Update  08/28/2018 Time of Session: 10:18 AM  [name] [name] MRN: [id number]  Principal Diagnosis: Schizoaffective disorder [facility])  Secondary Diagnoses: Principal Problem:   Schizoaffective disorder ([place])   Current Medications:  Current Facility-Administered Medications  Medication Dose Route Frequency Provider Last Rate Last Dose  . acetaminophen (TYLENOL) tablet 650 mg  650 mg Oral Q6H PRN Money, Lowry Ram, FNP      . alum & mag hydroxide-simeth (MAALOX/MYLANTA) 200-200-20 MG/5ML suspension 30 mL  30 mL Oral Q4H PRN Money, Darnelle Maffucci B, FNP      . diphenhydrAMINE (BENADRYL) capsule 50 mg  50 mg Oral Q6H PRN Money, Lowry Ram, FNP   50 mg at 08/27/18 2312   Or  . diphenhydrAMINE (BENADRYL) injection 50 mg  50 mg Intramuscular Q6H PRN Money, Darnelle Maffucci B, FNP      . haloperidol (HALDOL) tablet 5 mg  5 mg Oral Q6H PRN Money, Darnelle Maffucci B, FNP       Or  . haloperidol lactate (HALDOL) injection 5 mg  5 mg Intramuscular Q6H PRN Money, Lowry Ram, FNP      . LORazepam (ATIVAN) tablet 2 mg  2 mg Oral Q6H PRN Money, Lowry Ram, FNP   2 mg at 08/27/18 2126   Or  . LORazepam (ATIVAN) injection 2 mg  2 mg Intramuscular Q6H PRN Money, Darnelle Maffucci B, FNP      . magnesium hydroxide (MILK OF MAGNESIA) suspension 30 mL  30 mL Oral Daily PRN Money, Lowry Ram, FNP      . [START ON 09/14/2018] paliperidone (INVEGA SUSTENNA) injection 234 mg  234 mg Intramuscular Q28 days Money, Lowry Ram, FNP        PTA Medications: Medications Prior to Admission  Medication Sig Dispense Refill Last Dose  . EPINEPHrine 0.3 mg/0.3 mL IJ SOAJ injection Inject 0.3 mg into the skin daily as needed (for allergic reaction).    Past Month at Unknown time  . FLUoxetine (PROZAC) 20 MG capsule Take 20 mg by mouth daily.  4 Past Week at Unknown time  . hydrOXYzine (VISTARIL) 25 MG capsule Take 25 mg by mouth See admin instructions. 25 mg in the morning and 50 mg at night   Past Week at Unknown time  .  INVEGA SUSTENNA 156 MG/ML SUSP injection Inject 156 mg into the skin every 30 (thirty) days.  1 Past Week at Unknown time  . LORazepam (ATIVAN) 0.5 MG tablet Take 0.5 mg by mouth every 8 (eight) hours as needed for anxiety.   Past Month at Unknown time    Patient Stressors:    Patient Strengths:    Treatment Modalities: Medication Management, Group therapy, Case management,  1 to 1 session with clinician, Psychoeducation, Recreational therapy.   Physician Treatment Plan for Primary Diagnosis: Schizoaffective disorder ([place]) Long Term Goal(s): Improvement in symptoms so as ready for discharge  Short Term Goals: Ability to identify changes in lifestyle to reduce recurrence of condition will improve Ability to identify triggers associated with substance abuse/mental health issues will improve Ability to verbalize feelings will improve Ability to disclose and discuss suicidal ideas Ability to demonstrate self-control will improve Ability to identify and develop effective coping behaviors will improve Ability to maintain clinical measurements within normal limits will improve  Medication Management: Evaluate patient's response, side effects, and tolerance of medication regimen.  Therapeutic Interventions: 1 to 1 sessions, Unit Group sessions and Medication administration.  Evaluation of Outcomes: Progressing  Physician Treatment Plan for Secondary Diagnosis:  Principal Problem:   Schizoaffective disorder ([place])   Long Term Goal(s): Improvement in symptoms so as ready for discharge  Short Term Goals: Ability to identify changes in lifestyle to reduce recurrence of condition will improve Ability to identify triggers associated with substance abuse/mental health issues will improve Ability to verbalize feelings will improve Ability to disclose and discuss suicidal ideas Ability to demonstrate self-control will improve Ability to identify and develop effective coping behaviors will  improve Ability to maintain clinical measurements within normal limits will improve  Medication Management: Evaluate patient's response, side effects, and tolerance of medication regimen.  Therapeutic Interventions: 1 to 1 sessions, Unit Group sessions and Medication administration.  Evaluation of Outcomes: Progressing   RN Treatment Plan for Primary Diagnosis: Schizoaffective disorder ([place]) Long Term Goal(s): Knowledge of disease and therapeutic regimen to maintain health will improve  Short Term Goals: Ability to identify and develop effective coping behaviors will improve and Compliance with prescribed medications will improve  Medication Management: RN will administer medications as ordered by provider, will assess and evaluate patient's response and provide education to patient for prescribed medication. RN will report any adverse and/or side effects to prescribing provider.  Therapeutic Interventions: 1 on 1 counseling sessions, Psychoeducation, Medication administration, Evaluate responses to treatment, Monitor vital signs and CBGs as ordered, Perform/monitor CIWA, COWS, AIMS and Fall Risk screenings as ordered, Perform wound care treatments as ordered.  Evaluation of Outcomes: Progressing   LCSW Treatment Plan for Primary Diagnosis: Schizoaffective disorder ([name]) Long Term Goal(s): Safe transition to appropriate next level of care at discharge, Engage patient in therapeutic group addressing interpersonal concerns.  Short Term Goals: Engage patient in aftercare planning with referrals and resources  Therapeutic Interventions: Assess for all discharge needs, 1 to 1 time with Social worker, Explore available resources and support systems, Assess for adequacy in community support network, Educate family and significant other(s) on suicide prevention, Complete Psychosocial Assessment, Interpersonal group therapy.  Evaluation of Outcomes: Met  Return home, follow up  [place] in Treatment: Attending groups: Yes Participating in groups: Yes Taking medication as prescribed: Yes Toleration medication: Yes, no side effects reported at this time Family/Significant other contact made: No Patient understands diagnosis: Yes AEB asking for help with mental health symptoms Discussing patient identified problems/goals with staff: Yes Medical problems stabilized or resolved: Yes Denies suicidal/homicidal ideation: Yes Issues/concerns per patient self-inventory: None Other: N/A  New problem(s) identified: None identified at this time.   New Short Term/Long Term Goal(s): "My goal is to get my medication increased here.  I tried to go to [name] to get it done, but he is only there 2 days a month.  I had some PTSD symptoms going on."   Discharge Plan or Barriers:   Reason for Continuation of Hospitalization: PTSD Medication Management  Estimated Length of Stay: 9/6  Attendees: Patient: [name] [name] 08/28/2018  10:18 AM  Physician: Maris Berger, MD 08/28/2018  10:18 AM  Nursing: Eulogio Bear, RN 08/28/2018  10:18 AM  RN Care Manager: Lars Pinks, RN 08/28/2018  10:18 AM  Social Worker: Ripley Fraise 08/28/2018  10:18 AM  Recreational Therapist: Winfield Cunas 08/28/2018  10:18 AM  Other: Norberto Sorenson 08/28/2018  10:18 AM  Other:  08/28/2018  10:18 AM    Scribe for Treatment Team:  Roque Lias LCSW 08/28/2018 10:18 AM

## 2018-08-28 NOTE — Progress Notes (Signed)
DAR NOTE: Patient presents with bright affect and calm mood.  Pt has been observed in the milieu interacting with both peers and staff. Pt stated she had a good sleep, good appetite, normal energy, and good concentration. Denies pain, auditory and visual hallucinations.  Rates depression at 0, hopelessness at 0, and anxiety at 0.  Maintained on routine safety checks.  Medications given as prescribed.  Support and encouragement offered as needed.  Attended group and participated.  States goal for today is " just to smile and be happy.' Patient observed socializing with peers in the dayroom.  Offered no complaint.
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
## 2018-08-28 NOTE — Progress Notes (Signed)
Pt came to nursing station anxious about not being able to sleep, pt given Benadryl per [facility] to help her relax

## 2018-08-28 NOTE — Progress Notes (Signed)
Recreation Therapy Notes  INPATIENT RECREATION THERAPY ASSESSMENT  Patient Details Name: IDONNA HEEREN MRN: [id number] DOB: [birth year] Today's Date: 08/28/2018       Information Obtained From: Patient  Able to Participate in Assessment/Interview: Yes  Patient Presentation: Responsive  Reason for Admission (Per Patient): Other (Comments)("I am having trouble dealing with my sons deployment")  Patient Stressors: Family, Other (Comment)("my son and his deployment, bills, budgetting")  Coping Skills:   Music, Other (Comment)("Walking my dog, adult coloring")  Leisure Interests (2+):  Community - Shopping mall, Medical laboratory scientific officer - Programmer, multimedia (Comment)("traveling and shopping with my mother")  Frequency of Recreation/Participation: Weekly  Awareness of Community Resources:  Yes  Community Resources:  Church, Tax inspector  Current Use: Yes  If no, Barriers?:    Expressed Interest in [place] of Residence:  Lobbyist  Patient Main Form of Transportation: Musician  Patient Strengths:  Psychiatrist, kind person, open person"  Patient Identified Areas of Improvement:  "budget, having more friends"  Patient Goal for Hospitalization:  "get more sleep"  Current SI (including self-harm):  No  Current HI:  No  Current AVH: No  Staff Intervention Plan: Group Attendance  Consent to Intern Participation: N/A  Delos Haring, LRT/CTRS   Crystin Lechtenberg L Dovey Fatzinger 08/28/2018, 3:00 PM

## 2018-08-28 NOTE — Progress Notes (Signed)
Recreation Therapy Notes  Date: 08/28/18 Time: 1000 Location: 500 Hall Dayroom  Group Topic: Goal Setting  Goal Area(s) Addresses:  Patient will be able to identify at least 3 life goals.  Patient will be able to identify benefit of investing in life goals.  Patient will be able to identify benefit of setting life goals.   Behavioral Response: Engaged  Intervention: Worksheet, pencils  Activity: Goal Planning.  Patients were given a worksheet in which they were to set goals for the next week, the next month, the next year and in five years. Patients were to then identify the obstacles that would prevent reaching their goals, what they need to achieve their goals and what they can do tomorrow to work towards the goals.  Education: Discharge Planning, Radiographer, therapeutic, Leisure Education  Education Outcome: Acknowledges Education/In Group Clarification Provided/Needs Additional Education  Clinical Observations:  Pt stated goals are "something you work towards".  Pt also expressed when you reach one goal, you should create a new one.  Pt expressed in one week she like to organize her bathroom; in a month rake the leaves in the front/back yard; in a year save for trip to [place]; and in five years "I will be better off and save up to go to [place]".  Pt stated her obstacles were things that challenge her money wise; can achieve her goals by paying attention to personal spending; and she could start tomorrow by doing a budget and sticking to it.    Victorino Sparrow, LRT/CTRS     Victorino Sparrow A 08/28/2018 11:52 AM

## 2018-08-29 MED ORDER — PALIPERIDONE PALMITATE ER 234 MG/1.5ML IM SUSY: 234.0000 mg | PREFILLED_SYRINGE | INTRAMUSCULAR | 0 refills | Status: DC

## 2018-08-29 NOTE — BHH Suicide Risk Assessment (Signed)
[facility] Discharge Suicide Risk Assessment   Principal Problem: Schizoaffective disorder, bipolar type [facility]) Discharge Diagnoses:  Patient Active Problem List   Diagnosis Date Noted  . MDD (major depressive disorder), recurrent, severe, with psychosis ([place]) [F33.3] 10/11/2017  . Clostridium difficile colitis [A04.72] 05/29/2017  . Abdominal pain [R10.9] 05/28/2017  . Morbid obesity ([place]) [E66.01] 05/24/2017  . Pleuritis [R09.1] 05/19/2017  . Pleural effusion [J90] 05/19/2017  . Schizoaffective disorder, depressive type ([place]) [F25.1] 02/04/2017  . Gastroesophageal reflux [K21.9] 01/24/2017  . Obesity, Class II, BMI 35-39.9 [E66.9] 01/24/2017  . Suicidal behavior with attempted self-injury ([place]) [T14.91XA] 01/24/2017  . HTN (hypertension) [I10] 03/30/2016  . Serotonin syndrome [G25.79] 03/30/2016  . Elevated blood pressure [R03.0] 03/30/2016  . Leukocytosis [D72.829] 03/30/2016  . Precordial chest pain [R07.2] 03/30/2016  . Incisional hernia [K43.2] 12/24/2015  . Hyperprolactinemia ([place]) [E22.1] 09/11/2015  . Schizoaffective disorder, bipolar type ([place]) [F25.0] 09/10/2015  . Hx of borderline personality disorder [Z86.59] 09/10/2015  . Post traumatic stress disorder (PTSD) [F43.10] 09/10/2015  . Attempted suicide ([place]) [T14.91XA] 09/05/2015  . Prolonged Q-T interval on ECG [R94.31]   . Weight gain [R63.5] 03/06/2015  . [place] [R23.2] 03/06/2015  . Severe episode of recurrent major depressive disorder ([place]) [F33.2] 08/23/2014  . Iron deficiency anemia [D50.9] 11/15/2013  . Alcohol dependence ([place]) [F10.20] 06/02/2013  . Liver mass [R16.0] 05/28/2013  . Medication side effects [T88.7XXA] 05/07/2013  . Headache [R51] 04/03/2013  . Hx MRSA infection [Z86.14] 04/03/2013  . Postop check [Z09] 03/13/2013  . Postoperative wound infection [T81.49XA] 02/23/2013  . History of colostomy [IMO0002] 02/16/2013  . Borderline personality disorder ([place]) [F60.3] 08/16/2012    Class: Chronic  .  Acute blood loss anemia [D62] 08/01/2012  . Major depressive disorder [F32.9] 08/01/2012  . Kidney stone [N20.0] 03/31/2012  . Hydronephrosis [N13.30] 03/31/2012    Total Time spent with patient: 30 minutes  Musculoskeletal: Strength & Muscle Tone: within normal limits Gait & Station: normal Patient leans: N/A  Psychiatric Specialty Exam: Review of Systems  Constitutional: Negative for chills and fever.  Respiratory: Negative for cough and shortness of breath.   Cardiovascular: Negative for chest pain.  Gastrointestinal: Negative for abdominal pain, heartburn, nausea and vomiting.  Psychiatric/Behavioral: Negative for depression, hallucinations and suicidal ideas. The patient is not nervous/anxious and does not have insomnia.     Blood pressure 104/80, pulse 94, temperature 98.2 F (36.8 C), temperature source Oral, resp. rate 20, height 5\' 3"  (1.6 m), weight 80.7 kg, SpO2 99 %.Body mass index is 31.53 kg/m.  General Appearance: Casual, Neat and Well Groomed  Eye Contact::  Good  Speech:  Clear and Coherent and Normal Rate  Volume:  Normal  Mood:  Euthymic  Affect:  Appropriate and Congruent  Thought Process:  Coherent and Goal Directed  Orientation:  Full (Time, Place, and Person)  Thought Content:  Logical  Suicidal Thoughts:  No  Homicidal Thoughts:  No  Memory:  Immediate;   Fair Recent;   Fair Remote;   Fair  Judgement:  Fair  Insight:  Fair  Psychomotor Activity:  Normal  Concentration:  Good  Recall:  Good  Fund of Knowledge:Good  Language: Good  Akathisia:  No  Handed:    AIMS (if indicated):     Assets:  Resilience Social Support  Sleep:  Number of Hours: 6  Cognition: WNL  ADL's:  Intact   Mental Status Per Nursing Assessment::   On Admission:  NA  Demographic Factors:  Divorced or widowed, Caucasian, Living  alone and Unemployed  Loss Factors: Loss of significant relationship and Financial problems/change in socioeconomic status  Historical  Factors: Prior suicide attempts, Family history of mental illness or substance abuse and Impulsivity  Risk Reduction Factors:   Sense of responsibility to family, Positive social support, Positive therapeutic relationship and Positive coping skills or problem solving skills  Continued Clinical Symptoms:  Severe Anxiety and/or Agitation Depression:   Severe Schizophrenia:   Paranoid or undifferentiated type  Cognitive Features That Contribute To Risk:  None    Suicide Risk:  Minimal: No identifiable suicidal ideation.  Patients presenting with no risk factors but with morbid ruminations; may be classified as minimal risk based on the severity of the depressive symptoms  Follow-up [place], [facility] Follow up on 08/30/2018.   Why:  Wednesday at 9:45 with [place] information: [place] [address] [phone]        [facility] Follow up on 08/31/2018.   Why:  Thursday at [facility] with [place] information: [place], [place], [place] [address] [place]:        Subjective Data:  Per assessment note;The pt is a 47 year old female, who came in due to increased psychosis. When the assessment started the pt didn't speak and walked out of the room. She was encouraged to come back to the room and she came back. She stated she came in due to hearing a voice called "[place]". She sated [place] gave her a gun and is telling her to shoot herself. The pt stated she doesn't want to shoot herself. According to past records the pt was last hospitalized 03/2018 at [facility]. The pt was also hospitalized 1/19 and 10/18X2. According to previous notes, the pt reported has received ECT multiple times in 2018. The pt is a client of [facility].  Per evaluation today: Pt shares, "I'm doing good. I feel ready to go." She denies any specific concerns today. She is sleeping well. Her appetite is good. She denies SI/HI/AH/VH.  She has no physical complaints. She plans to follow up with her outpatient provider tomorrow, and we discussed about how her QTc was prolonged which is why some of her medications were stopped, and pt verbalized good understanding that she would discuss with her outpatient provider before resuming her other medications aside from the [facility]. She also plans to follow up with her previous therapist in 2 days. She was able to engage in safety planning including plan to return to [facility] or contact emergency services if she feels unable to maintain her own safety or the safety of others. Pt had no further questions, comments, or concerns.     Plan Of Care/Follow-up recommendations:   -Discharge to outpatient level of care  -Schizoaffective disorder, bipolar type             -Continue withInvega sustenna 234mg  IM q28 days 9/19 duemood stabilization.  -Insomnia             -Continue benadryl 50mg  po qhs prn insomnia  Activity:  as tolerated Diet:  normal Tests:  NA Other:  see above for DC plan  Pennelope Bracken, MD 08/29/2018, 9:45 AM

## 2018-08-29 NOTE — Plan of Care (Signed)
Patient attended two groups during her stay, learning a few new coping skills.

## 2018-08-29 NOTE — Progress Notes (Signed)
Patient ID: [name] [name], female   DOB: 1971/06/03, 47 y.o.   MRN: [id number] Patient discharged to home/self care in the presence of her friends.  Patient denies SI, HI and AVH upon discharge and feels safe to continue care in an outpatient environment.  Patient acknowledged understanding of all discharge instructions and receipt of all personal belongings. Continue to monitor as planned.

## 2018-08-29 NOTE — Progress Notes (Signed)
Recreation Therapy Notes  Date: 9.3.19 Time: 1000 Location: 500 Hall Dayroom  Group Topic: Communication, Team Building, Problem Solving  Goal Area(s) Addresses:  Patient will effectively work with peer towards shared goal.  Patient will identify skill used to make activity successful.  Patient will identify how skills used during activity can be used to reach post d/c goals.   Behavioral Response: None  Intervention: STEM Activity   Activity: In team's, using 10 red plastic cups, patients were asked to stack the cups into a pyramid using the rubber band with the strings attached.    Education: Education officer, community, Dentist.   Education Outcome: Acknowledges education/In group clarification offered/Needs additional education.   Clinical Observations/Feedback: Pt observed but didn't participate.   Victorino Sparrow, LRT/CTRS         Ria Comment, Phyillis Dascoli A 08/29/2018 11:59 AM

## 2018-08-29 NOTE — Progress Notes (Signed)
  [facility] Adult Case Management Discharge Plan :  Will you be returning to the same living situation after discharge:  Yes,  home with mother At discharge, do you have transportation home?: Yes,  daughter or sheriff Do you have the ability to pay for your medications: Yes,  MCD  Release of information consent forms completed and in the chart;  Patient's signature needed at discharge.  Patient to Follow up at: Follow-up [place], [facility] Follow up on 08/30/2018.   Why:  Wednesday at 9:45 with [place] information: [place] [address] [phone]        [facility] Follow up on 08/31/2018.   Why:  Thursday at [facility] with [place] information: [address] [place], [place] [address] [phone] F:          Next level of care provider has access to [place] and Suicide Prevention discussed: Yes,  yes  Have you used any form of tobacco in the last 30 days? (Cigarettes, Smokeless Tobacco, Cigars, and/or Pipes): No  Has patient been referred to the Quitline?: N/A patient is not a smoker  Patient has been referred for addiction treatment: [place], LCSW 08/29/2018, 9:22 AM

## 2018-08-29 NOTE — Progress Notes (Signed)
Recreation Therapy Notes  INPATIENT RECREATION TR PLAN  Patient Details Name: [name] [name] MRN: [id number] DOB: 06/10/1971 Today's Date: 08/29/2018  Rec Therapy Plan Is patient appropriate for Therapeutic Recreation?: Yes Treatment times per week: 3 times per week Estimated Length of Stay: 5-7 days TR Treatment/Interventions: Group participation (Comment)  Discharge Criteria Pt will be discharged from therapy if:: Discharged Treatment plan/goals/alternatives discussed and agreed upon by:: Patient/family  Discharge Summary Short term goals set: See patient care plan Short term goals met: Adequate for discharge Progress toward goals comments: Groups attended Which groups?: Goal setting, Other (Comment)(Team building activity) Reason goals not met: Patient's goals are adequate for discharge, however did not have the opportunity to attend a coping skills group.  Therapeutic equipment acquired: N/A Reason patient discharged from therapy: Discharge from hospital Pt/family agrees with progress & goals achieved: Yes Date patient discharged from therapy: 08/29/18   , LRT/CTRS   L  08/29/2018, 11:12 AM  

## 2018-08-29 NOTE — Progress Notes (Signed)
Nursing Progress Note: 7p-7a D: Pt currently presents with a depressed/anxious/worried/guarded affect and behavior. Pt states "I feel better today. I've had a lot of ups and downs. Med changes have really messed me up." Interacting appropriately with the milieu. Pt reports good sleep during the previous night with current medication regimen. Pt did attend wrap-up group.  A: Pt provided with medications per providers orders. Pt's labs and vitals were monitored throughout the night. Pt supported emotionally and encouraged to express concerns and questions. Pt educated on medications.  R: Pt's safety ensured with 15 minute and environmental checks. Pt currently denies SI, HI, and AVH. Pt verbally contracts to seek staff if SI,HI, or AVH occurs and to consult with staff before acting on any harmful thoughts. Will continue to monitor.
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
## 2018-08-30 DIAGNOSIS — F603 Borderline personality disorder: Secondary | ICD-10-CM | POA: Diagnosis not present

## 2018-08-31 DIAGNOSIS — F29 Unspecified psychosis not due to a substance or known physiological condition: Secondary | ICD-10-CM | POA: Diagnosis not present

## 2018-09-01 DIAGNOSIS — I10 Essential (primary) hypertension: Secondary | ICD-10-CM | POA: Diagnosis not present

## 2018-09-01 DIAGNOSIS — F603 Borderline personality disorder: Secondary | ICD-10-CM | POA: Diagnosis present

## 2018-09-01 DIAGNOSIS — F251 Schizoaffective disorder, depressive type: Secondary | ICD-10-CM | POA: Diagnosis not present

## 2018-09-01 DIAGNOSIS — F250 Schizoaffective disorder, bipolar type: Secondary | ICD-10-CM | POA: Diagnosis present

## 2018-09-06 DIAGNOSIS — H5340 Unspecified visual field defects: Secondary | ICD-10-CM | POA: Diagnosis not present

## 2018-09-07 DIAGNOSIS — F29 Unspecified psychosis not due to a substance or known physiological condition: Secondary | ICD-10-CM | POA: Diagnosis not present

## 2018-09-11 DIAGNOSIS — R5383 Other fatigue: Secondary | ICD-10-CM | POA: Diagnosis not present

## 2018-09-11 DIAGNOSIS — N390 Urinary tract infection, site not specified: Secondary | ICD-10-CM | POA: Diagnosis not present

## 2018-09-11 DIAGNOSIS — Z1231 Encounter for screening mammogram for malignant neoplasm of breast: Secondary | ICD-10-CM | POA: Diagnosis not present

## 2018-09-11 DIAGNOSIS — Z01419 Encounter for gynecological examination (general) (routine) without abnormal findings: Secondary | ICD-10-CM | POA: Diagnosis not present

## 2018-09-11 DIAGNOSIS — Z6832 Body mass index (BMI) 32.0-32.9, adult: Secondary | ICD-10-CM | POA: Diagnosis not present

## 2018-09-12 DIAGNOSIS — F29 Unspecified psychosis not due to a substance or known physiological condition: Secondary | ICD-10-CM | POA: Diagnosis not present

## 2018-09-13 DIAGNOSIS — F603 Borderline personality disorder: Secondary | ICD-10-CM | POA: Diagnosis not present

## 2018-09-13 DIAGNOSIS — Z79899 Other long term (current) drug therapy: Secondary | ICD-10-CM | POA: Diagnosis not present

## 2018-09-14 DIAGNOSIS — H40003 Preglaucoma, unspecified, bilateral: Secondary | ICD-10-CM | POA: Diagnosis not present

## 2018-09-21 DIAGNOSIS — F29 Unspecified psychosis not due to a substance or known physiological condition: Secondary | ICD-10-CM | POA: Diagnosis not present

## 2018-09-27 DIAGNOSIS — Z79899 Other long term (current) drug therapy: Secondary | ICD-10-CM | POA: Diagnosis not present

## 2018-09-27 DIAGNOSIS — F603 Borderline personality disorder: Secondary | ICD-10-CM | POA: Diagnosis not present

## 2018-09-28 DIAGNOSIS — J309 Allergic rhinitis, unspecified: Secondary | ICD-10-CM | POA: Diagnosis not present

## 2018-09-28 DIAGNOSIS — B356 Tinea cruris: Secondary | ICD-10-CM | POA: Diagnosis not present

## 2018-09-28 DIAGNOSIS — F29 Unspecified psychosis not due to a substance or known physiological condition: Secondary | ICD-10-CM | POA: Diagnosis not present

## 2018-09-28 DIAGNOSIS — Z23 Encounter for immunization: Secondary | ICD-10-CM | POA: Diagnosis not present

## 2018-09-30 ENCOUNTER — Other Ambulatory Visit: Payer: Self-pay

## 2018-09-30 ENCOUNTER — Inpatient Hospital Stay (HOSPITAL_COMMUNITY)
Admission: AD | Admit: 2018-09-30 | Discharge: 2018-10-03 | DRG: 885 | Disposition: A | Discharge status: Home / Self Care | Payer: Medicare Other | Source: Intra-hospital | Attending: Psychiatry | Admitting: Psychiatry

## 2018-09-30 ENCOUNTER — Emergency Department (HOSPITAL_COMMUNITY)
Admission: EM | Admit: 2018-09-30 | Discharge: 2018-09-30 | Disposition: A | Discharge status: Other Acute Inpatient Hospital | Payer: Medicare Other | Attending: Emergency Medicine | Admitting: Emergency Medicine

## 2018-09-30 ENCOUNTER — Encounter (HOSPITAL_COMMUNITY): Payer: Self-pay

## 2018-09-30 DIAGNOSIS — K219 Gastro-esophageal reflux disease without esophagitis: Secondary | ICD-10-CM | POA: Diagnosis present

## 2018-09-30 DIAGNOSIS — Z9889 Other specified postprocedural states: Secondary | ICD-10-CM | POA: Diagnosis not present

## 2018-09-30 DIAGNOSIS — G4730 Sleep apnea, unspecified: Secondary | ICD-10-CM | POA: Diagnosis present

## 2018-09-30 DIAGNOSIS — F419 Anxiety disorder, unspecified: Secondary | ICD-10-CM | POA: Diagnosis not present

## 2018-09-30 DIAGNOSIS — Z915 Personal history of self-harm: Secondary | ICD-10-CM | POA: Diagnosis not present

## 2018-09-30 DIAGNOSIS — Z8614 Personal history of Methicillin resistant Staphylococcus aureus infection: Secondary | ICD-10-CM

## 2018-09-30 DIAGNOSIS — Z8051 Family history of malignant neoplasm of kidney: Secondary | ICD-10-CM

## 2018-09-30 DIAGNOSIS — Z8249 Family history of ischemic heart disease and other diseases of the circulatory system: Secondary | ICD-10-CM

## 2018-09-30 DIAGNOSIS — F4310 Post-traumatic stress disorder, unspecified: Secondary | ICD-10-CM | POA: Diagnosis present

## 2018-09-30 DIAGNOSIS — R45851 Suicidal ideations: Secondary | ICD-10-CM | POA: Diagnosis not present

## 2018-09-30 DIAGNOSIS — Z6835 Body mass index (BMI) 35.0-35.9, adult: Secondary | ICD-10-CM | POA: Diagnosis not present

## 2018-09-30 DIAGNOSIS — Z87442 Personal history of urinary calculi: Secondary | ICD-10-CM

## 2018-09-30 DIAGNOSIS — F333 Major depressive disorder, recurrent, severe with psychotic symptoms: Secondary | ICD-10-CM | POA: Diagnosis not present

## 2018-09-30 DIAGNOSIS — Z8659 Personal history of other mental and behavioral disorders: Secondary | ICD-10-CM | POA: Diagnosis not present

## 2018-09-30 DIAGNOSIS — Z8619 Personal history of other infectious and parasitic diseases: Secondary | ICD-10-CM

## 2018-09-30 DIAGNOSIS — Z86711 Personal history of pulmonary embolism: Secondary | ICD-10-CM

## 2018-09-30 DIAGNOSIS — I10 Essential (primary) hypertension: Secondary | ICD-10-CM | POA: Diagnosis present

## 2018-09-30 DIAGNOSIS — Z818 Family history of other mental and behavioral disorders: Secondary | ICD-10-CM

## 2018-09-30 DIAGNOSIS — Z85828 Personal history of other malignant neoplasm of skin: Secondary | ICD-10-CM | POA: Diagnosis not present

## 2018-09-30 DIAGNOSIS — F250 Schizoaffective disorder, bipolar type: Secondary | ICD-10-CM | POA: Diagnosis not present

## 2018-09-30 DIAGNOSIS — R440 Auditory hallucinations: Secondary | ICD-10-CM | POA: Diagnosis not present

## 2018-09-30 DIAGNOSIS — G4700 Insomnia, unspecified: Secondary | ICD-10-CM | POA: Diagnosis not present

## 2018-09-30 DIAGNOSIS — Z79899 Other long term (current) drug therapy: Secondary | ICD-10-CM | POA: Insufficient documentation

## 2018-09-30 LAB — CBC
HEMATOCRIT: 39.1 % (ref 36.0–46.0)
HEMOGLOBIN: 12.2 g/dL (ref 12.0–15.0)
MCH: 26.9 pg (ref 26.0–34.0)
MCHC: 31.2 g/dL (ref 30.0–36.0)
MCV: 86.3 fL (ref 78.0–100.0)
Platelets: 285 10*3/uL (ref 150–400)
RBC: 4.53 MIL/uL (ref 3.87–5.11)
RDW: 13.8 % (ref 11.5–15.5)
WBC: 8.2 10*3/uL (ref 4.0–10.5)

## 2018-09-30 LAB — RAPID URINE DRUG SCREEN, HOSP PERFORMED
Amphetamines: NOT DETECTED
BARBITURATES: NOT DETECTED
BENZODIAZEPINES: NOT DETECTED
COCAINE: NOT DETECTED
Opiates: NOT DETECTED
Tetrahydrocannabinol: NOT DETECTED

## 2018-09-30 LAB — COMPREHENSIVE METABOLIC PANEL
ALK PHOS: 130 U/L — AB (ref 38–126)
ALT: 28 U/L (ref 0–44)
AST: 27 U/L (ref 15–41)
Albumin: 4.2 g/dL (ref 3.5–5.0)
Anion gap: 8 (ref 5–15)
BUN: 10 mg/dL (ref 6–20)
CO2: 23 mmol/L (ref 22–32)
CREATININE: 1 mg/dL (ref 0.44–1.00)
Calcium: 9.4 mg/dL (ref 8.9–10.3)
Chloride: 108 mmol/L (ref 98–111)
Glucose, Bld: 106 mg/dL — ABNORMAL HIGH (ref 70–99)
Potassium: 3.6 mmol/L (ref 3.5–5.1)
Sodium: 139 mmol/L (ref 135–145)
Total Bilirubin: 0.6 mg/dL (ref 0.3–1.2)
Total Protein: 6.9 g/dL (ref 6.5–8.1)

## 2018-09-30 LAB — ETHANOL

## 2018-09-30 LAB — I-STAT BETA HCG BLOOD, ED (MC, WL, AP ONLY): I-stat hCG, quantitative: <5 m[IU]/mL (ref <5)

## 2018-09-30 LAB — SALICYLATE LEVEL

## 2018-09-30 LAB — ACETAMINOPHEN LEVEL: Acetaminophen (Tylenol), Serum: <10 ug/mL — ABNORMAL LOW (ref 10–30)

## 2018-09-30 MED ORDER — QUETIAPINE FUMARATE 50 MG PO TABS
50.0000 mg | ORAL_TABLET | Freq: Once | ORAL | Status: AC
  Administered 2018-10-01: 50 mg via ORAL
  Filled 2018-09-30 (×2): qty 1

## 2018-09-30 MED ORDER — ALUM & MAG HYDROXIDE-SIMETH 200-200-20 MG/5ML PO SUSP
30.0000 mL | ORAL | Status: DC | PRN
  Administered 2018-10-01: 30 mL via ORAL
  Filled 2018-09-30: qty 30

## 2018-09-30 MED ORDER — ACETAMINOPHEN 325 MG PO TABS
650.0000 mg | ORAL_TABLET | Freq: Four times a day (QID) | ORAL | Status: DC | PRN
  Administered 2018-10-01 – 2018-10-03 (×3): 650 mg via ORAL
  Filled 2018-09-30 (×3): qty 2

## 2018-09-30 MED ORDER — MAGNESIUM HYDROXIDE 400 MG/5ML PO SUSP: 30.0000 mL | Freq: Every day | ORAL | Status: DC | PRN

## 2018-09-30 NOTE — ED Triage Notes (Signed)
Pt endorses pressure in her ears and has been hearing voices "telling me to take a whole bottle of benadryl" Pt was taking benadryl for sleep "so my brain would calm down" Pt states that "[place] is the one who talks to me" Pt has known hx of schizophrenia. States that she was having thoughts of harming herself yesterday. VSS.

## 2018-09-30 NOTE — ED Notes (Addendum)
Patriciaann Clan, PA, patient meets inpatient criteria. TTS to secure placement. Per Larose Kells, patient has been accepted to [facility]. Doctor and RN informed of placement.  Arrival Time: anytime Accepting: Patriciaann Clan, PA Attending: Dr. Nancy Fetter Room#: [phone]

## 2018-09-30 NOTE — ED Notes (Signed)
Disposition: Patriciaann Clan, PA, patient meets inpatient criteria. TTS to secure placement.

## 2018-09-30 NOTE — ED Notes (Signed)
TTS at bedside. 
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
## 2018-09-30 NOTE — ED Notes (Signed)
Pt's belongings removed and pt placed in scrubs. Pt has 2 envelopes with valuables locked up with security that included her wallet, watch, phone, charger, keys, checkbook. Other belongings placed in locker by [facility] EMT
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
## 2018-09-30 NOTE — ED Notes (Signed)
ED Provider at bedside. 

## 2018-10-01 ENCOUNTER — Other Ambulatory Visit: Payer: Self-pay

## 2018-10-01 ENCOUNTER — Encounter (HOSPITAL_COMMUNITY): Payer: Self-pay

## 2018-10-01 DIAGNOSIS — F250 Schizoaffective disorder, bipolar type: Secondary | ICD-10-CM

## 2018-10-01 MED ORDER — QUETIAPINE FUMARATE 100 MG PO TABS
100.0000 mg | ORAL_TABLET | Freq: Every day | ORAL | Status: DC
  Administered 2018-10-01: 100 mg via ORAL
  Filled 2018-10-01 (×3): qty 1

## 2018-10-01 MED ORDER — LORAZEPAM 0.5 MG PO TABS: 0.5000 mg | ORAL_TABLET | Freq: Four times a day (QID) | ORAL | Status: DC | PRN

## 2018-10-01 MED ORDER — OXCARBAZEPINE 300 MG PO TABS
300.0000 mg | ORAL_TABLET | Freq: Two times a day (BID) | ORAL | Status: DC
  Administered 2018-10-01 – 2018-10-03 (×5): 300 mg via ORAL
  Filled 2018-10-01 (×4): qty 1
  Filled 2018-10-01: qty 14
  Filled 2018-10-01 (×3): qty 1
  Filled 2018-10-01: qty 14
  Filled 2018-10-01: qty 1

## 2018-10-01 MED ORDER — LORAZEPAM 0.5 MG PO TABS
0.5000 mg | ORAL_TABLET | Freq: Four times a day (QID) | ORAL | Status: DC | PRN
  Administered 2018-10-01 – 2018-10-02 (×2): 0.5 mg via ORAL
  Filled 2018-10-01 (×2): qty 1

## 2018-10-01 NOTE — [facility] Group Notes (Signed)
[facility] Group Notes:  (Nursing/MHT/Case Management/Adjunct)  Date:  10/01/2018  Time:  4:00 PM  Type of Therapy:  Nurse Education  Participation Level:  Active  Participation Quality:  Appropriate and Attentive  Affect:  Appropriate  Cognitive:  Alert and Appropriate  Insight:  Appropriate  Engagement in Group:  Engaged and Improving  Modes of Intervention:  Discussion and Education  Summary of Progress/Problems: pt's discussed and practiced relaxation techniques they can utilize throughout the day when coping.    Otelia Limes Tynan Boesel 10/01/2018, 4:50 PM

## 2018-10-01 NOTE — Tx Team (Addendum)
Initial Treatment Plan 10/01/2018 12:43 AM [name] [name] UKR:[id number]    PATIENT STRESSORS: Medication change or noncompliance Other: Pt c/o auditory and visual hallucinations causing her to want to harm self.    PATIENT STRENGTHS: Ability for insight Motivation for treatment/growth Supportive family/friends   PATIENT IDENTIFIED PROBLEMS: SI  Auditory hallucinations (Psychosis)  Visual hallucinations  Anxiety    " I would like to get stable on my medications."           DISCHARGE CRITERIA:  Improved stabilization in mood, thinking, and/or behavior Need for constant or close observation no longer present  PRELIMINARY DISCHARGE PLAN: Return to previous living arrangement  PATIENT/FAMILY INVOLVEMENT: This treatment plan has been presented to and reviewed with the patient, [name] [name], and/or family member.  The patient and family have been given the opportunity to ask questions and make suggestions.  Aurora Mask, RN 10/01/2018, 12:43 AM

## 2018-10-01 NOTE — Plan of Care (Signed)
Progress Note  D: pt found in the dayroom; compliant with medication administration. Pt states she slept fair last night. Pt rates her depression/hopelessness/anxiety a 0/0/2 out of 10 respectively. Pt describes complaints of ringing in her ears. "I have water in it". Pt rates this at 6/10 in pain. Pt denied any medication for this. Pt states her goal for today is to have a blessed day and she will achieve this by praying. Pt denies any si/hi/ah/vh and verbally agrees to approach staff if these become apparent or before harming herself while at [facility].  A: pt provided support and encouragement. Pt given medications per protocol and standing orders. Q38m safety checks implemented and continued. R: pt safe on the unit. Will continue to monitor.   Pt progressing in the following metrics  Problem: Education: Goal: Knowledge of [facility] Education information/materials will improve Outcome: Progressing Goal: Emotional status will improve Outcome: Progressing Goal: Mental status will improve Outcome: Progressing Goal: Verbalization of understanding the information provided will improve Outcome: Progressing   Problem: Activity: Goal: Interest or engagement in activities will improve Outcome: Progressing Goal: Sleeping patterns will improve Outcome: Progressing

## 2018-10-01 NOTE — Progress Notes (Signed)
Patient admitted voluntarily after being seen at the ED. She reports that she has been having pain in her ears and voices by the name of [place] have been telling her to harm herself. She reports taking 1/2 bottle of benadryl to help her sleep so that the voices would go away. Writer did not see any other documentation reporting that she took a 1/2 bottle of benadryl. She reports that her medications were increased this week and thinks it may be the trileptal causing her to feel this way. She reports seeing thing and reaching for things that are not actually there. She did report that her son was deployed in January and she worries about him. She was placed on high fall precaution because she reports feeling unsteady on her feet d/t the fluid in her ears she was told in the ED. Skin assessment completed by K. Brooks Therapist, sports. Safety maintained on unit with 15 min checks.

## 2018-10-01 NOTE — Plan of Care (Signed)
  Problem: Safety: Goal: Periods of time without injury will increase Outcome: Progressing Note:  No episodes of self injury noted    Problem: Education: Goal: Will be free of psychotic symptoms Outcome: Progressing Note:  She denies hearing "[place]'s" voice today.

## 2018-10-01 NOTE — Progress Notes (Signed)
Patient ID: [name] [name], female   DOB: 1971-12-10, 47 y.o.   MRN: [id number] PER STATE REGULATIONS 482.30  THIS CHART WAS REVIEWED FOR MEDICAL NECESSITY WITH RESPECT TO THE PATIENT'S ADMISSION/DURATION OF STAY.  NEXT REVIEW DATE:10/04/18  Roma Schanz, RN, BSN CASE MANAGER
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
## 2018-10-01 NOTE — BHH Group Notes (Signed)
[facility] LCSW Group Therapy Note  Date/Time:  10/01/2018  11:00AM-12:00PM  Type of Therapy and Topic:  Group Therapy:  Music and Mood  Participation Level:  Minimal   Description of Group: In this process group, members listened to a variety of genres of music and identified that different types of music evoke different responses.  Patients were encouraged to identify music that was soothing for them and music that was energizing for them.  Patients discussed how this knowledge can help with wellness and recovery in various ways including managing depression and anxiety as well as encouraging healthy sleep habits.    Therapeutic Goals: 1. Patients will explore the impact of different varieties of music on mood 2. Patients will verbalize the thoughts they have when listening to different types of music 3. Patients will identify music that is soothing to them as well as music that is energizing to them 4. Patients will discuss how to use this knowledge to assist in maintaining wellness and recovery 5. Patients will explore the use of music as a coping skill  Summary of Patient Progress:  At the beginning of group, patient expressed that she felt good, "a little anxious."  At the end of group, during which she did not share at all but did listen, she said she felt "relaxed."  Therapeutic Modalities: Solution Focused Brief Therapy Activity   Selmer Dominion, LCSW

## 2018-10-01 NOTE — BHH Suicide Risk Assessment (Addendum)
[facility] Admission Suicide Risk Assessment   Nursing information obtained from:  Patient Demographic factors:  Divorced or widowed Current Mental Status:  Suicidal ideation indicated by patient Loss Factors:  NA Historical Factors:  Prior suicide attempts Risk Reduction Factors:  Living with another person, especially a relative  Total Time spent with patient: 45 minutes Principal Problem:  Schizoaffective Disorder  Diagnosis:   Patient Active Problem List   Diagnosis Date Noted  . MDD (major depressive disorder), severe ([place]) [F32.2] 09/30/2018  . MDD (major depressive disorder), recurrent, severe, with psychosis ([place]) [F33.3] 10/11/2017  . Clostridium difficile colitis [A04.72] 05/29/2017  . Abdominal pain [R10.9] 05/28/2017  . Morbid obesity ([place]) [E66.01] 05/24/2017  . Pleuritis [R09.1] 05/19/2017  . Pleural effusion [J90] 05/19/2017  . Schizoaffective disorder, depressive type ([place]) [F25.1] 02/04/2017  . Gastroesophageal reflux [K21.9] 01/24/2017  . Obesity, Class II, BMI 35-39.9 [E66.9] 01/24/2017  . Suicidal behavior with attempted self-injury ([place]) [T14.91XA] 01/24/2017  . HTN (hypertension) [I10] 03/30/2016  . Serotonin syndrome [G25.79] 03/30/2016  . Elevated blood pressure [R03.0] 03/30/2016  . Leukocytosis [D72.829] 03/30/2016  . Precordial chest pain [R07.2] 03/30/2016  . Incisional hernia [K43.2] 12/24/2015  . Hyperprolactinemia ([place]) [E22.1] 09/11/2015  . Schizoaffective disorder, bipolar type ([place]) [F25.0] 09/10/2015  . Hx of borderline personality disorder [Z86.59] 09/10/2015  . Post traumatic stress disorder (PTSD) [F43.10] 09/10/2015  . Attempted suicide ([place]) [T14.91XA] 09/05/2015  . Prolonged Q-T interval on ECG [R94.31]   . Weight gain [R63.5] 03/06/2015  . [place] [R23.2] 03/06/2015  . Severe episode of recurrent major depressive disorder ([place]) [F33.2] 08/23/2014  . Iron deficiency anemia [D50.9] 11/15/2013  . Alcohol dependence ([name]) [F10.20] 06/02/2013   . Liver mass [R16.0] 05/28/2013  . Medication side effects [T88.7XXA] 05/07/2013  . Headache [R51] 04/03/2013  . Hx MRSA infection [Z86.14] 04/03/2013  . Postop check [Z09] 03/13/2013  . Postoperative wound infection [T81.49XA] 02/23/2013  . History of colostomy [IMO0002] 02/16/2013  . Borderline personality disorder ([place]) [F60.3] 08/16/2012    Class: Chronic  . Acute blood loss anemia [D62] 08/01/2012  . Major depressive disorder [F32.9] 08/01/2012  . Kidney stone [N20.0] 03/31/2012  . Hydronephrosis [N13.30] 03/31/2012   Subjective Data:   Continued Clinical Symptoms:  Alcohol Use Disorder Identification Test Final Score (AUDIT): 0 The "Alcohol Use Disorders Identification Test", Guidelines for Use in Primary Care, Second Edition.  World Pharmacologist [facility]). Score between 0-7:  no or low risk or alcohol related problems. Score between 8-15:  moderate risk of alcohol related problems. Score between 16-19:  high risk of alcohol related problems. Score 20 or above:  warrants further diagnostic evaluation for alcohol dependence and treatment.   CLINICAL FACTORS:  26, divorced, lives with mother,has two adult children, on disability. Patient presented to the ED voluntarily, at the recommendation of her therapist .  States a good friend of hers died a few days ago, and states she is also concerned about her son, who is in the [employer] and currently deployed overseas. She states " I have been having a hard time dealing with it". States that before then she had been " feeling all right".  Describes history of  intermittent auditory hallucinations , and states she hears a female voice she refers to as " [place]", commenting on her actions , such as " she is brushing her teeth" or similar. States she also had some command hallucinations recently , and states voice telling her to " take the whole bottle of Benadryl". She states that she  overdosed on " a handful" of Benadryl 2 days ago, which  she states was not suicidal in intent , but rather an effort to " get some relief". " I thought it would make [place] go away and stop the ringing in my ears ".  Denies alcohol or drug abuse . States she has been compliant with prescribed medications- Seroquel 25 mgrs BID and 100 mgrs QHS, Invega Sustenna Q month, which she last got 09/28/18, Trileptal 300 mgrs BID, Buspar 10 mgrs BID.  Reports Trileptal and Seroquel are new medications for her , has been taking regularly x 2 weeks, denies side effects .  She has history of psychiatric illness and has been diagnosed with Schizoaffective Disorder. She has history of prior psychiatric admissions, and had been admitted to [facility] in  August/19 for similar presentation/psychosis. At the time she was discharged on Invega Sustenna 156 mgrs IM Q 28 days. She was most recently admitted at [facility] about 3 weeks ago, for psychotic symptoms/depression. States " they changed my medications" States she was started on Seroquel, and reports that this change was due to concerns about prolonged QTc . (8/30 EKG QTc 497---9/2 EKG  QTc 471 , 10/6 EKG QTc 472 )    Denies medical illnesses, allergic to Augmentin, Avelox.  Dx- Schizoaffective Disorder   Plan- Inpatient admission. We reviewed medications, states she feels Seroquel/Trileptal combination has been helpful and well tolerated thus far . Continue Seroquel  100 mgrs QHS  Continue Trileptal 300 mgrs BID  Ativan 0.5 mgr Q 6 hours PRN for anxiety or insomnia       Musculoskeletal: Strength & Muscle Tone: within normal limits Gait & Station: normal Patient leans: N/A  Psychiatric Specialty Exam: Physical Exam  ROS mild headache, no chest pain, no shortness of breath, reports nausea, vomited x 1 yesterday , not today, no rash, no fever   Blood pressure 104/75, pulse 94, temperature 98.7 F (37.1 C), temperature source Oral, resp. rate 20, height 5\' 3"  (1.6 m), weight 84.4 kg, last menstrual period  09/28/2018.Body mass index is 32.95 kg/m.  General Appearance: well groomed   Eye Contact:  Good  Speech:  Normal Rate  Volume:  Normal  Mood:  reports mood is " better"  Affect:  somewhat blunted, but smiles briefly at times   Thought Process:  Linear and Descriptions of Associations: Intact  Orientation:  Full (Time, Place, and Person)  Thought Content:  describes recent auditory hallucinations, as above. Denies hallucinations today and does not currently present internally preoccupied, no delusions expressed   Suicidal Thoughts:  No denies current suicidal or self injurious ideations, no homicidal or violent ideations  Homicidal Thoughts:  No  Memory:  recent and remote grossly intact   Judgement:  Fair  Insight:  Fair  Psychomotor Activity:  Normal  Concentration:  Concentration: Good and Attention Span: Good  Recall:  Good  Fund of Knowledge:  Good  Language:  Good  Akathisia:  Negative  Handed:  Right  AIMS (if indicated):     Assets:  Communication Skills Desire for Improvement Resilience  ADL's:  Intact  Cognition:  WNL  Sleep:  Number of Hours: 2.25      COGNITIVE FEATURES THAT CONTRIBUTE TO RISK:  Closed-mindedness and Loss of executive function    SUICIDE RISK:   Moderate:  Frequent suicidal ideation with limited intensity, and duration, some specificity in terms of plans, no associated intent, good self-control, limited dysphoria/symptomatology, some risk factors present, and identifiable protective factors,  including available and accessible social support.  PLAN OF CARE: Patient will be admitted to inpatient psychiatric unit for stabilization and safety. Will provide and encourage milieu participation. Provide medication management and maked adjustments as needed.  Will follow daily.    I certify that inpatient services furnished can reasonably be expected to improve the patient's condition.   Jenne Campus, MD 10/01/2018, 9:11 AM

## 2018-10-01 NOTE — H&P (Addendum)
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 Seroquel/Trileptal combination has been helpful and well tolerated thus far . Continue Seroquel  100 mgrs QHS  Continue Trileptal 300 mgrs BID  Ativan 0.5 mgr Q 6 hours PRN for anxiety or insomnia

## 2018-10-02 DIAGNOSIS — F419 Anxiety disorder, unspecified: Secondary | ICD-10-CM

## 2018-10-02 DIAGNOSIS — G4700 Insomnia, unspecified: Secondary | ICD-10-CM

## 2018-10-02 MED ORDER — QUETIAPINE FUMARATE 200 MG PO TABS
200.0000 mg | ORAL_TABLET | Freq: Every day | ORAL | Status: DC
  Administered 2018-10-02: 200 mg via ORAL
  Filled 2018-10-02: qty 1
  Filled 2018-10-02: qty 7
  Filled 2018-10-02 (×2): qty 1

## 2018-10-02 NOTE — Progress Notes (Signed)
Recreation Therapy Notes  Date: 10.7.19 Time: 1000 Location: 500 Hall Dayroom  Group Topic: Coping Skills  Goal Area(s) Addresses:  Patient will be able to identify coping skills. Patient will be able to identify benefit of using coping skills post d/c.  Behavioral Response: Engaged  Intervention: Worksheet, pencils  Activity: Building surveyor.  Patients were to identify the things that they feel are holding them back and write them inside the spider web.  Patients were to then identify their coping skills and write them on the outside of the web.  Education: Radiographer, therapeutic, Dentist.   Education Outcome: Acknowledges understanding/In group clarification offered/Needs additional education.   Clinical Observations/Feedback: Pt described a coping skill as "something you use to get over something".  Pt expressed her biggest issues were the death of a friend and finances.  Pt identified her coping skills as remember the good times, do something fun they used to do together, listen to [employer], budget, buy cheaper things and manage money better.     Victorino Sparrow, LRT/CTRS     Ria Comment, Calistro Rauf A 10/02/2018 10:49 AM

## 2018-10-02 NOTE — Tx Team (Addendum)
Interdisciplinary Treatment and Diagnostic Plan Update  10/02/2018 Time of Session: 8:55 AM  [name] [name] MRN: [id number]  Principal Diagnosis: <principal problem not specified>  Secondary Diagnoses: Active Problems:   MDD (major depressive disorder), severe (H[facility])   Current Medications:  Current Facility-Administered Medications  Medication Dose Route Frequency Provider Last Rate Last Dose  . acetaminophen (TYLENOL) tablet 650 mg  650 mg Oral Q6H PRN Laverle Hobby, PA-C   650 mg at 10/01/18 1441  . alum & mag hydroxide-simeth (MAALOX/MYLANTA) 200-200-20 MG/5ML suspension 30 mL  30 mL Oral Q4H PRN Patriciaann Clan E, PA-C   30 mL at 10/01/18 0034  . LORazepam (ATIVAN) tablet 0.5 mg  0.5 mg Oral Q6H PRN Cobos, Myer Peer, MD   0.5 mg at 10/01/18 2108  . magnesium hydroxide (MILK OF MAGNESIA) suspension 30 mL  30 mL Oral Daily PRN Laverle Hobby, PA-C      . Oxcarbazepine (TRILEPTAL) tablet 300 mg  300 mg Oral BID Cobos, Myer Peer, MD   300 mg at 10/02/18 0826  . QUEtiapine (SEROQUEL) tablet 100 mg  100 mg Oral QHS Cobos, Myer Peer, MD   100 mg at 10/01/18 2108    PTA Medications: Medications Prior to Admission  Medication Sig Dispense Refill Last Dose  . acetaminophen (TYLENOL) 500 MG tablet Take 500-1,000 mg by mouth every 8 (eight) hours as needed for mild pain or headache.   Unknown at Unknown time  . busPIRone (BUSPAR) 10 MG tablet Take 10 mg by mouth 2 (two) times daily.   Unknown at Unknown time  . Oxcarbazepine (TRILEPTAL) 300 MG tablet Take 300 mg by mouth 2 (two) times daily.   Unknown at Unknown time  . paliperidone (INVEGA SUSTENNA) 234 MG/1.5ML SUSY injection Inject 234 mg into the muscle every 28 (twenty-eight) days. (Due on 09-14-18): For mood control (Patient taking differently: Inject 234 mg into the muscle every 28 (twenty-eight) days. ) 1.8 mL 0 Unknown at Unknown time  . QUEtiapine (SEROQUEL) 100 MG tablet Take 100 mg by mouth at bedtime.  0 Unknown at Unknown  time  . QUEtiapine (SEROQUEL) 25 MG tablet Take 25 mg by mouth 2 (two) times daily.   Unknown at Unknown time    Patient Stressors: Medication change or noncompliance Other: Pt c/o auditory and visual hallucinations causing her to want to harm self.   Patient Strengths: Ability for insight Motivation for treatment/growth Supportive family/friends  Treatment Modalities: Medication Management, Group therapy, Case management,  1 to 1 session with clinician, Psychoeducation, Recreational therapy.   Physician Treatment Plan for Primary Diagnosis: <principal problem not specified> Long Term Goal(s): Improvement in symptoms so as ready for discharge  Short Term Goals: Ability to identify changes in lifestyle to reduce recurrence of condition will improve Ability to identify triggers associated with substance abuse/mental health issues will improve Ability to verbalize feelings will improve Ability to disclose and discuss suicidal ideas Ability to demonstrate self-control will improve Ability to identify and develop effective coping behaviors will improve Ability to maintain clinical measurements within normal limits will improve  Medication Management: Evaluate patient's response, side effects, and tolerance of medication regimen.  Therapeutic Interventions: 1 to 1 sessions, Unit Group sessions and Medication administration.  Evaluation of Outcomes: Adequate for Discharge  Physician Treatment Plan for Secondary Diagnosis: Active Problems:   MDD (major depressive disorder), severe ([place])   Long Term Goal(s): Improvement in symptoms so as ready for discharge  Short Term Goals: Ability to identify changes in lifestyle  to reduce recurrence of condition will improve Ability to identify triggers associated with substance abuse/mental health issues will improve Ability to verbalize feelings will improve Ability to disclose and discuss suicidal ideas Ability to demonstrate self-control will  improve Ability to identify and develop effective coping behaviors will improve Ability to maintain clinical measurements within normal limits will improve  Medication Management: Evaluate patient's response, side effects, and tolerance of medication regimen.  Therapeutic Interventions: 1 to 1 sessions, Unit Group sessions and Medication administration.  Evaluation of Outcomes: Adequate for Discharge   RN Treatment Plan for Primary Diagnosis: <principal problem not specified> Long Term Goal(s): Knowledge of disease and therapeutic regimen to maintain health will improve  Short Term Goals: Ability to identify and develop effective coping behaviors will improve and Compliance with prescribed medications will improve  Medication Management: RN will administer medications as ordered by provider, will assess and evaluate patient's response and provide education to patient for prescribed medication. RN will report any adverse and/or side effects to prescribing provider.  Therapeutic Interventions: 1 on 1 counseling sessions, Psychoeducation, Medication administration, Evaluate responses to treatment, Monitor vital signs and CBGs as ordered, Perform/monitor CIWA, COWS, AIMS and Fall Risk screenings as ordered, Perform wound care treatments as ordered.  Evaluation of Outcomes: Adequate for Discharge   LCSW Treatment Plan for Primary Diagnosis: <principal problem not specified> Long Term Goal(s): Safe transition to appropriate next level of care at discharge, Engage patient in therapeutic group addressing interpersonal concerns.  Short Term Goals: Engage patient in aftercare planning with referrals and resources  Therapeutic Interventions: Assess for all discharge needs, 1 to 1 time with Social worker, Explore available resources and support systems, Assess for adequacy in community support network, Educate family and significant other(s) on suicide prevention, Complete Psychosocial Assessment,  Interpersonal group therapy.  Evaluation of Outcomes: Met  Return home, follow up [facility] in Treatment: Attending groups: Yes Participating in groups: Yes Taking medication as prescribed: Yes Toleration medication: Yes, no side effects reported at this time Family/Significant other contact made: No Patient understands diagnosis: Yes AEB asking for help with mental health symptoms Discussing patient identified problems/goals with staff: Yes Medical problems stabilized or resolved: Yes Denies suicidal/homicidal ideation: Yes Issues/concerns per patient self-inventory: None Other: N/A  New problem(s) identified: None identified at this time.   New Short Term/Long Term Goal(s):"I needed to get some rest.  I wasn't sleeping before I cam into the hospital."  Discharge Plan or Barriers:   Reason for Continuation of Hospitalization: Poor sleep AH/VH Medication Management  Estimated Length of Stay: [place] d/c tomorrow  Attendees: Patient: [name] [name] 10/02/2018  8:55 AM  Physician: Maris Berger, MD 10/02/2018  8:55 AM  Nursing: Elesa Massed, RN 10/02/2018  8:55 AM  RN Care Manager: Lars Pinks, RN 10/02/2018  8:55 AM  Social Worker: Ripley Fraise 10/02/2018  8:55 AM  Recreational Therapist: Winfield Cunas 10/02/2018  8:55 AM  Other: Norberto Sorenson 10/02/2018  8:55 AM  Other:  10/02/2018  8:55 AM    Scribe for Treatment Team:  Roque Lias LCSW 10/02/2018 8:55 AM

## 2018-10-02 NOTE — Progress Notes (Signed)
Patient denies SI, HI and AVH this shift.  Patient has been compliant with medications and attended groups.  Patient has had no incident of behavioral dsycontrol.   Assess client for safety, offer medications as prescribed, engage client in 1:1 staff talks.   Patient able to contract for safety, Continue to monitor as planned.
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
## 2018-10-02 NOTE — Progress Notes (Signed)
D:  [name] [name] has been up and visible on the unit.  She denied SI/HI or A/V hallucinations.  She stated that she feels better and hopes that she won't have to be in the hospital too long.  She has been attending groups with good participation.  She talked about her daughter that has moved in with her partner and that her son in deployed in [place] and will hopefully be coming home next month.  She denied any pain or discomfort and appeared to be in no physical distress.  She seemed anxious and ativan given with hs medications.  She is currently resting with her eyes closed and appears to be asleep. A:  1:1 with RN for support and encouragement.  Medications as ordered.  Q 15 minute checks maintained for safety.  Encouraged participation in group and unit activities.   R:  [name] remains safe on the unit.  We will continue to monitor the progress towards her goals.

## 2018-10-02 NOTE — Progress Notes (Signed)
Recreation Therapy Notes  Patient admitted to unit 10.5.19. Due to admission within last year, no new assessment conducted at this time. Last assessment conducted 9.2.19. Patient reports reason for admission was the death of friend.  Pt reports her stressors remain as her son's deployment and finances.  Pt also added meditation as a coping skill.  Patient denies SI, HI, AVH at this time. Patient reports goal of getting more sleep.  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

## 2018-10-02 NOTE — BHH Group Notes (Signed)
LCSW Group Therapy Note  10/02/2018 1:15pm  Type of Therapy and Topic:  Group Therapy:  Feelings around Relapse and Recovery  Participation Level:  Active   Description of Group:    Patients in this group will discuss emotions they experience before and after a relapse. They will process how experiencing these feelings, or avoidance of experiencing them, relates to having a relapse. Facilitator will guide patients to explore emotions they have related to recovery. Patients will be encouraged to process which emotions are more powerful. They will be guided to discuss the emotional reaction significant others in their lives may have to their relapse or recovery. Patients will be assisted in exploring ways to respond to the emotions of others without this contributing to a relapse.  Therapeutic Goals: 1. Patient will identify two or more emotions that lead to a relapse for them 2. Patient will identify two emotions that result when they relapse 3. Patient will identify two emotions related to recovery 4. Patient will demonstrate ability to communicate their needs through discussion and/or role plays   Summary of Patient Progress:  Stayed the entire time, engaged throughout.  Talked about ignoring the clues that things are going in a bad direction, and does not pay attention until it affects her body "like when I am no longer able to sleep, or when I lose my appetite."  Also talked about the fact that she is hard headed, "and if I would just listen to the people that I trust when they give me feedback, I would be OK."   Therapeutic Modalities:   Cognitive Behavioral Therapy Solution-Focused Therapy Assertiveness Training Relapse Prevention Therapy   Trish Mage, LCSW 10/02/2018 3:47 PM

## 2018-10-02 NOTE — Progress Notes (Signed)
D: Pt denies SI/HI/AVH. Pt is pleasant and cooperative. Pt visible in the dayroom this evening.  A: Pt was offered support and encouragement. Pt was given scheduled medications. Pt was encourage to attend groups. Q 15 minute checks were done for safety.  R:Pt attends groups and interacts  with peers and staff. Pt is taking medication. Pt has no complaints.Pt receptive to treatment and safety maintained on unit.  Problem: Education: Goal: Emotional status will improve Outcome: Progressing Note:  Pt stated she was doing better , pt said she has been dealing with the grieving process with her friend   Problem: Education: Goal: Mental status will improve Outcome: Progressing   Problem: Activity: Goal: Sleeping patterns will improve Outcome: Progressing

## 2018-10-03 MED ORDER — QUETIAPINE FUMARATE 200 MG PO TABS: 200.0000 mg | ORAL_TABLET | Freq: Every day | ORAL | 0 refills | Status: DC

## 2018-10-03 MED ORDER — OXCARBAZEPINE 300 MG PO TABS: 300.0000 mg | ORAL_TABLET | Freq: Two times a day (BID) | ORAL | 0 refills | Status: DC

## 2018-10-03 NOTE — BHH Suicide Risk Assessment (Signed)
[facility] INPATIENT:  Family/Significant Other Suicide Prevention Education  Suicide Prevention Education:  Patient Refusal for Family/Significant Other Suicide Prevention Education: The patient [name] [name] has refused to provide written consent for family/significant other to be provided Family/Significant Other Suicide Prevention Education during admission and/or prior to discharge.  Physician notified.  Trish Mage 10/03/2018, 9:55 AM

## 2018-10-03 NOTE — Plan of Care (Signed)
Pt was able to identify coping strategies at completion of recreation therapy group sessions.   Lula Michaux, LRT/CTRS 

## 2018-10-03 NOTE — Progress Notes (Signed)
Discharge note: Patient reviewed discharge paperwork with RN including prescriptions, follow up appointments, and lab work. Patient given the opportunity to ask questions. All concerns were addressed. All belongings were returned to patient. Denied SI/HI/AVH. Patient thanked staff for their care while at the hospital.  Patient was discharged to lobby where security was waiting to pick her up to escort her to her car which was parked at [employer].

## 2018-10-03 NOTE — Progress Notes (Signed)
  [name] Adult Case Management Discharge Plan :  Will you be returning to the same living situation after discharge:  Yes,  home At discharge, do you have transportation home?: Yes,  security to [facility] to get her car Do you have the ability to pay for your medications: Yes,  insurance  Release of information consent forms completed and in the chart;  Patient's signature needed at discharge.  Patient to Follow up at: Follow-up [facility], [facility] Follow up on 10/04/2018.   Why:  Wednesday at 1:45 for your hospital follow up appointment Contact information: [place] [address] [place] Follow up on 10/05/2018.   Why:  Your next appointment for counseling with [name] is Thursday at 2:00. Contact information: [place], [place] [address] Phone: [phone]          Next level of care provider has access to [place] and Suicide Prevention discussed: Yes,  yes  Have you used any form of tobacco in the last 30 days? (Cigarettes, Smokeless Tobacco, Cigars, and/or Pipes): No  Has patient been referred to the Quitline?: N/A patient is not a smoker  Patient has been referred for addiction treatment: [place], LCSW 10/03/2018, 9:56 AM

## 2018-10-03 NOTE — Progress Notes (Signed)
Patient self inventory- Patient slept well last night, sleep medication was requested and was helpful. Appetite has been good, energy normal, concentration good. Patient denies SI HI AVH and depression, hopelessness, and anxiety are all rated 0/10. Patient's goal is "to have a blessed day."  Patient is compliant with medications prescribed per provider. Safety is maintained with 15 minute checks as well as environmental checks. Will continue to monitor.

## 2018-10-03 NOTE — Progress Notes (Signed)
Recreation Therapy Notes  INPATIENT RECREATION TR PLAN  Patient Details Name: [name] [name] MRN: [id number] DOB: Aug 18, 1971 Today's Date: 10/03/2018  Rec Therapy Plan    Discharge Criteria    Discharge Summary Short term goals set: See patient care plan Short term goals met: Complete Progress toward goals comments: Groups attended Which groups?: Coping skills, Goal setting Reason goals not met: None Therapeutic equipment acquired: N/[name] Reason patient discharged from therapy: Discharge from hospital Pt/family agrees with progress & goals achieved: Yes Date patient discharged from therapy: 10/03/18    Victorino Sparrow, LRT/CTRS  Ria Comment, [name] [name] 10/03/2018, 11:55 AM

## 2018-10-03 NOTE — Plan of Care (Signed)
  Problem: Education: Goal: Emotional status will improve Outcome: Adequate for Discharge   Problem: Education: Goal: Mental status will improve Outcome: Adequate for Discharge   Problem: Education: Goal: Verbalization of understanding the information provided will improve Outcome: Adequate for Discharge   
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
## 2018-10-03 NOTE — Progress Notes (Signed)
Recreation Therapy Notes  Date: 10.8.19 Time: 1000 Location: 500 Hall Dayroom  Group Topic: Leisure Education, Goal Setting  Goal Area(s) Addresses:  Patient will be able to identify at least 3 life goals.  Patient will be able to identify benefit of investing in life goals.  Patient will be able to identify benefit of setting life goals.   Behavioral Response:  Engaged  Intervention: Worksheet  Activity: Setting Life Goals.  Patients were to identify that they were doing well, where they needed to make improvements and set a goal to make those improvements in the categories of family, friends, work/school, spirituality, body and mental health.  Education:  Discharge Planning, Radiographer, therapeutic, Leisure Education   Education Outcome: Acknowledges Education/In Group Clarification Provided/Needs Additional Education  Clinical Observations: Pt completed her sheet but didn't feel comfortable sharing in front of the group.  Pt shared her top 3 categories with LRT. Pt expressed that with her family she talks to her daughter on a daily basis, wants to do more fun things with her children, set a goal of getting along with her mother; spirituality- does well at maintaining her weight, saying no/don't feel a need to say yes to all things, and set a goal to find her place; mental health- does well at getting help when she needs it, improve following help when asked to, and set a goal to have a good mind.      Victorino Sparrow, LRT/CTRS    Ria Comment, Kenidi Elenbaas A 10/03/2018 11:08 AM

## 2018-10-03 NOTE — BHH Suicide Risk Assessment (Signed)
[facility] Discharge Suicide Risk Assessment   Principal Problem: Schizoaffective disorder, bipolar type [facility]) Discharge Diagnoses:  Patient Active Problem List   Diagnosis Date Noted  . MDD (major depressive disorder), recurrent, severe, with psychosis ([place]) [F33.3] 10/11/2017  . Clostridium difficile colitis [A04.72] 05/29/2017  . Abdominal pain [R10.9] 05/28/2017  . Morbid obesity ([place]) [E66.01] 05/24/2017  . Pleuritis [R09.1] 05/19/2017  . Pleural effusion [J90] 05/19/2017  . Schizoaffective disorder, depressive type ([place]) [F25.1] 02/04/2017  . Gastroesophageal reflux [K21.9] 01/24/2017  . Obesity, Class II, BMI 35-39.9 [E66.9] 01/24/2017  . Suicidal behavior with attempted self-injury ([place]) [T14.91XA] 01/24/2017  . HTN (hypertension) [I10] 03/30/2016  . Serotonin syndrome [G25.79] 03/30/2016  . Elevated blood pressure [R03.0] 03/30/2016  . Leukocytosis [D72.829] 03/30/2016  . Precordial chest pain [R07.2] 03/30/2016  . Incisional hernia [K43.2] 12/24/2015  . Hyperprolactinemia ([place]) [E22.1] 09/11/2015  . Schizoaffective disorder, bipolar type ([place]) [F25.0] 09/10/2015  . Hx of borderline personality disorder [Z86.59] 09/10/2015  . Post traumatic stress disorder (PTSD) [F43.10] 09/10/2015  . Attempted suicide ([place]) [T14.91XA] 09/05/2015  . Prolonged Q-T interval on ECG [R94.31]   . Weight gain [R63.5] 03/06/2015  . Flushing [R23.2] 03/06/2015  . Severe episode of recurrent major depressive disorder ([place]) [F33.2] 08/23/2014  . Iron deficiency anemia [D50.9] 11/15/2013  . Alcohol dependence ([place]) [F10.20] 06/02/2013  . Liver mass [R16.0] 05/28/2013  . Medication side effects [T88.7XXA] 05/07/2013  . Headache [R51] 04/03/2013  . Hx MRSA infection [Z86.14] 04/03/2013  . Postop check [Z09] 03/13/2013  . Postoperative wound infection [T81.49XA] 02/23/2013  . History of colostomy [IMO0002] 02/16/2013  . Borderline personality disorder ([place]) [F60.3] 08/16/2012    Class: Chronic  .  Acute blood loss anemia [D62] 08/01/2012  . Major depressive disorder [F32.9] 08/01/2012  . Kidney stone [N20.0] 03/31/2012  . Hydronephrosis [N13.30] 03/31/2012    Total Time spent with patient: 30 minutes  Musculoskeletal: Strength & Muscle Tone: within normal limits Gait & Station: normal Patient leans: N/A  Psychiatric Specialty Exam: Review of Systems  Constitutional: Negative for chills and fever.  Respiratory: Negative for cough and shortness of breath.   Cardiovascular: Negative for chest pain.  Gastrointestinal: Negative for abdominal pain, heartburn, nausea and vomiting.  Psychiatric/Behavioral: Negative for depression, hallucinations and suicidal ideas. The patient is not nervous/anxious and does not have insomnia.     Blood pressure 111/64, pulse (!) 116, temperature 98.2 F (36.8 C), temperature source Oral, resp. rate 20, height 5\' 3"  (1.6 m), weight 84.4 kg, last menstrual period 09/28/2018.Body mass index is 32.95 kg/m.  General Appearance: Casual and Fairly Groomed  Engineer, water::  Good  Speech:  Clear and Coherent and Normal Rate  Volume:  Normal  Mood:  Euthymic  Affect:  Appropriate and Congruent  Thought Process:  Coherent and Goal Directed  Orientation:  Full (Time, Place, and Person)  Thought Content:  Logical  Suicidal Thoughts:  No  Homicidal Thoughts:  No  Memory:  Immediate;   Fair Recent;   Fair Remote;   Fair  Judgement:  Fair  Insight:  Fair  Psychomotor Activity:  Normal  Concentration:  Fair  Recall:  [employer] of Knowledge:Fair  Language: Fair  Akathisia:  No  Handed:    AIMS (if indicated):     Assets:  Resilience  Sleep:  Number of Hours: 6.75  Cognition: WNL  ADL's:  Intact   Mental Status Per Nursing Assessment::   On Admission:  Suicidal ideation indicated by patient  Demographic Factors:  Caucasian and  Living alone  Loss Factors: Financial problems/change in socioeconomic status  Historical Factors: Prior suicide  attempts and Impulsivity  Risk Reduction Factors:   Positive social support, Positive therapeutic relationship and Positive coping skills or problem solving skills  Continued Clinical Symptoms:  Severe Anxiety and/or Agitation Bipolar Disorder:   Depressive phase Schizophrenia:   Paranoid or undifferentiated type  Cognitive Features That Contribute To Risk:  None    Suicide Risk:  Minimal: No identifiable suicidal ideation.  Patients presenting with no risk factors but with morbid ruminations; may be classified as minimal risk based on the severity of the depressive symptoms  Follow-up [place] Follow up.   Why:  Your next appointment for medication management is...  Contact information: [name] [address] [place] [address] Follow up on 10/05/2018.   Why:  Your next appointment for counseling with [name] is Thursday at 2:00. Contact information: [place], [place] [address] Phone: [phone]        Subjective Data:  As per intake SRA: 34, divorced, lives with mother,has two adult children, on disability. Patient presented to the ED voluntarily, at the recommendation of her therapist . States a good friend of hers died a few days ago, and states she is also concerned about her son, who is in the [employer] and currently deployed overseas. She states " I have been having a hard time dealing with it". States that before then she had been " feeling all right". Describes history of intermittent auditory hallucinations , and states she hears a female voice she refers to as " [place]", commenting on her actions , such as " she is brushing her teeth" or similar. States she also had some command hallucinations recently , and states voice telling her to " take the whole bottle of Benadryl". She states that she overdosed on " a handful" of Benadryl 2 days ago, which she states was not  suicidal in intent , but rather an effort to " get some relief". " I thought it would make [place] go away and stop the ringing in my ears ".  Denies alcohol or drug abuse . States she has been compliant with prescribed medications- Seroquel 25 mgrs BID and 100 mgrs QHS, Invega Sustenna Q month, which she last got 09/28/18, Trileptal 300 mgrs BID, Buspar 10 mgrs BID. Reports Trileptal and Seroquel are new medications for her , has been taking regularly x 2 weeks, deniesside effects . She has history of psychiatric illness and has been diagnosed with Schizoaffective Disorder. She has history of prior psychiatric admissions, and had been admitted to [facility] in August/19 for similar presentation/psychosis. At the time she was discharged on Invega Sustenna 156 mgrs IM Q 28 days. She was most recently admitted at [facility] about 3 weeks ago, for psychotic symptoms/depression. States " they changed my medications" States she was started on Seroquel, and reports that this change was due to concerns about prolonged QTc . (8/30 EKG QTc 497---9/2 EKG QTc 471 , 10/6 EKG QTc 472 )  Today upon evaluation:  Today upon evaluation, pt shares, "I'm good - I had good sleep last night." Pt reports that overall she is doing well, and she feels that she has returned to her baseline. Her appetite is good. She denies other physical complaints. She denies SI/HI/AH/VH. She is tolerating her medications well, and she is in agreement to continue  her current medication regimen without changes. She plans to return to follow up at her previous provider at [facility]. She is future-oriented about returning home and attending her friend's [employer]. She was able to engage in safety planning including plan to return to [facility] or contact emergency services if she feels unable to maintain her own safety or the safety of others. Pt had no further questions, comments, or concerns.    Plan Of Care/Follow-up recommendations:    -Discharge to outpatient level of care  -Schizoaffective disorder, bipolar type             -Continue seroquel 200mg  po qhs             -Continue trileptal 300mg  po BID  Activity:  as tolerated Diet:  normal Tests:  NA Other:  see above for DC plan  Pennelope Bracken, MD 10/03/2018, 8:49 AM

## 2018-10-05 DIAGNOSIS — F29 Unspecified psychosis not due to a substance or known physiological condition: Secondary | ICD-10-CM | POA: Diagnosis not present

## 2018-10-11 DIAGNOSIS — F603 Borderline personality disorder: Secondary | ICD-10-CM | POA: Diagnosis not present

## 2018-10-12 ENCOUNTER — Other Ambulatory Visit: Payer: Self-pay

## 2018-10-12 ENCOUNTER — Encounter: Payer: Self-pay | Admitting: Sports Medicine

## 2018-10-12 ENCOUNTER — Ambulatory Visit (INDEPENDENT_AMBULATORY_CARE_PROVIDER_SITE_OTHER): Payer: Medicare Other | Admitting: Sports Medicine

## 2018-10-12 DIAGNOSIS — R569 Unspecified convulsions: Secondary | ICD-10-CM | POA: Insufficient documentation

## 2018-10-12 DIAGNOSIS — L600 Ingrowing nail: Secondary | ICD-10-CM | POA: Diagnosis not present

## 2018-10-12 DIAGNOSIS — L603 Nail dystrophy: Secondary | ICD-10-CM

## 2018-10-12 DIAGNOSIS — M79674 Pain in right toe(s): Secondary | ICD-10-CM

## 2018-10-12 DIAGNOSIS — F29 Unspecified psychosis not due to a substance or known physiological condition: Secondary | ICD-10-CM | POA: Diagnosis not present

## 2018-10-12 MED ORDER — NEOMYCIN-POLYMYXIN-HC 3.5-10000-1 OT SOLN: OTIC | 0 refills | Status: DC

## 2018-10-12 NOTE — Patient Instructions (Signed)

## 2018-10-12 NOTE — Progress Notes (Signed)
Subjective: [name] [name] is a 47 y.o. female patient presents to office today complaining of a moderate to severely painful incurvated, red, hot, swollen lateral greater than medial nail border of the first toe on the right foot. This has been present for off and on but over the last few days has gotten severely worse with redness and drainage and swelling states that the pain on last night was 10 out of 10 had to take Motrin and the toe is very sensitive. Patient has treated this by pedicures and having it trimmed in the past by Dr. March Rummage.  Patient reports that trimmed helped for a few months but she was tired of dealing with ingrown toenail coming back and treated the area with Epson salt and peroxide last night.  Patient denies fever/chills/nausea/vomitting/any other related constitutional symptoms at this time.  Patient Active Problem List   Diagnosis Date Noted  . Seizures ([place]) 10/12/2018  . 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[place]) 04/05/2018  . MDD (major depressive disorder), recurrent, severe, with psychosis ([place]) 10/11/2017  . 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 Left flank pain 06/21/2017  . Bronchitis 06/01/2017  . Tylenol ingestion 06/01/2017  . Clostridium difficile colitis 05/29/2017  . Abdominal pain 05/28/2017  . Morbid obesity ([place]) 05/24/2017  . Pleuritis 05/19/2017  . Pleural effusion 05/19/2017  . Schizoaffective disorder, depressive type ([place]) 02/04/2017  . Gastroesophageal reflux 01/24/2017  . Obesity, Class II, BMI 35-39.9 01/24/2017  . Suicidal behavior with attempted self-injury ([place]) 01/24/2017  . HTN (hypertension) 03/30/2016  . Serotonin syndrome 03/30/2016  . Elevated blood pressure 03/30/2016  . Leukocytosis 03/30/2016  . Precordial chest pain 03/30/2016  . Incisional hernia 12/24/2015  . Hyperprolactinemia ([place]) 09/11/2015  . Schizoaffective disorder, bipolar type ([place]) 09/10/2015  . Hx of borderline personality disorder 09/10/2015  . Post  traumatic stress disorder (PTSD) 09/10/2015  . Attempted suicide ([place]) 09/05/2015  . Prolonged Q-T interval on ECG   . Weight gain 03/06/2015  . Flushing 03/06/2015  . Severe episode of recurrent major depressive disorder ([place]) 08/23/2014  . Iron deficiency anemia 11/15/2013  . Alcohol dependence ([place]) 06/02/2013  . Liver mass 05/28/2013  . Medication side effects 05/07/2013  . Headache 04/03/2013  . Hx MRSA infection 04/03/2013  . Postop check 03/13/2013  . Postoperative wound infection 02/23/2013  . History of colostomy 02/16/2013  . Borderline personality disorder ([place]) 08/16/2012    Class: Chronic  . Acute blood loss anemia 08/01/2012  . Major depressive disorder 08/01/2012  . Kidney stone 03/31/2012  . Hydronephrosis 03/31/2012    Current Outpatient Medications on File Prior to Visit  Medication Sig Dispense Refill  . busPIRone (BUSPAR) 10 MG tablet Take 10 mg by mouth 3 (three) times daily.    . Oxcarbazepine (TRILEPTAL) 300 MG tablet Take 1 tablet (300 mg total) by mouth 2 (two) times daily. 60 tablet 0  . QUEtiapine (SEROQUEL) 200 MG tablet Take 1 tablet (200 mg total) by mouth at bedtime. 30 tablet 0   No current facility-administered medications on file prior to visit.     Allergies  Allergen Reactions  . Augmentin [Amoxicillin-Pot Clavulanate] Itching, Swelling, Rash and Other (See Comments)    Has patient had a PCN reaction causing immediate rash, facial/tongue/throat swelling, SOB or lightheadedness with hypotension: Yes Has patient had a PCN reaction causing severe rash involving mucus membranes or skin necrosis: No Has patient had a PCN reaction that required hospitalization No Has patient had a PCN reaction occurring within the last 10 years:  Yes 2016 If all of the above answers are "NO", then may proceed with Cephalosporin use.  . Enoxaparin Hives  . Avelox [Moxifloxacin Hcl In Nacl] Itching, Swelling and Rash  . Penicillins Itching, Swelling, Rash and Other  (See Comments)    Has patient had a PCN reaction causing immediate rash, facial/tongue/throat swelling, SOB or lightheadedness with hypotension: Yes Has patient had a PCN reaction causing severe rash involving mucus membranes or skin necrosis: No Has patient had a PCN reaction that required hospitalization: Already in hospital when reaction happened Has patient had a PCN reaction occurring within the last 10 years: Unknown If all of the above answers are "NO", then may proceed with Cephalosporin use.   . Sodium Hydroxide Rash    Objective:  There were no vitals filed for this visit.  General: Well developed, nourished, in no acute distress, alert and oriented x3   Dermatology: Skin is warm, dry and supple bilateral.  Right hallux nail appears to be  severely incurvated with hyperkeratosis formation at the distal aspects of  the medial and lateral nail borders with the lateral nail border being most involved and significant lifting distally of the nail and subungual debris. (+) Erythema. (+) Edema. (-) serosanguous  drainage present. The remaining nails appear unremarkable at this time. There are no open sores, lesions or other signs of infection  present.  Vascular: Dorsalis Pedis artery and Posterior Tibial artery pedal pulses are 2/4 bilateral with immedate capillary fill time. Pedal hair growth present. No lower extremity edema.   Neruologic: Grossly intact via light touch bilateral.  Musculoskeletal: Tenderness to palpation of the right hallux nail bed. Muscular strength within normal limits in all groups bilateral.   Assesement and Plan: Problem List Items Addressed This Visit    None    Visit Diagnoses    Ingrown nail    -  Primary   Relevant Medications   neomycin-polymyxin-hydrocortisone (CORTISPORIN) OTIC solution   Onychodystrophy       Great toe pain, right          -Discussed treatment alternatives and plan of care; Explained permanent/temporary nail avulsion and  post procedure course to patient.  Patient opts for total right hallux nail removal with phenol application. - After a verbal consent, injected 3 ml of a 50:50 mixture of 2% plain  lidocaine and 0.5% plain marcaine in a normal hallux block fashion. Next, a  betadine prep was performed. Anesthesia was tested and found to be appropriate.  The offending right hallux nail was incised and freed from the hyponychium and a hyponychium removing the entire nail. The area was curretted for any remaining nail or spicules. Phenol application performed and the area was then flushed with alcohol and dressed with antibiotic cream and a dry sterile dressing. -Patient was instructed to leave the dressing intact for today and begin soaking  in a weak solution of betadine or Epsom salt and water tomorrow. Patient was instructed to  soak for 15 minutes each day and apply Corticosporin and a gauze or bandaid dressing each day. -Patient was instructed to monitor the toe for signs of infection and return to office if toe becomes red, hot or swollen. -Advised ice, elevation, and tylenol or motrin if needed for pain.  -Patient is to return in 2 weeks for follow up care/nail check or sooner if problems arise.  Landis Martins, DPM

## 2018-10-19 DIAGNOSIS — F29 Unspecified psychosis not due to a substance or known physiological condition: Secondary | ICD-10-CM | POA: Diagnosis not present

## 2018-10-24 DIAGNOSIS — F29 Unspecified psychosis not due to a substance or known physiological condition: Secondary | ICD-10-CM | POA: Diagnosis not present

## 2018-10-26 ENCOUNTER — Ambulatory Visit (INDEPENDENT_AMBULATORY_CARE_PROVIDER_SITE_OTHER): Payer: Medicare Other | Admitting: Sports Medicine

## 2018-10-26 ENCOUNTER — Encounter: Payer: Self-pay | Admitting: Sports Medicine

## 2018-10-26 DIAGNOSIS — L600 Ingrowing nail: Secondary | ICD-10-CM

## 2018-10-26 DIAGNOSIS — Z9889 Other specified postprocedural states: Secondary | ICD-10-CM

## 2018-10-26 DIAGNOSIS — L603 Nail dystrophy: Secondary | ICD-10-CM

## 2018-10-26 DIAGNOSIS — M79674 Pain in right toe(s): Secondary | ICD-10-CM

## 2018-10-26 NOTE — Progress Notes (Signed)
Subjective: [name] [name] is a 47 y.o. female patient returns to office today for follow up evaluation after having Right Hallux permanent total nail avulsion performed on (10-12-18). Patient has been soaking using epsom salt and applying topical Corticosporin antibiotic covered with gauze and paper tape daily. Patient deniesfever/chills/nausea/vomitting/any other related constitutional symptoms at this time.  Patient Active Problem List   Diagnosis Date Noted  . Seizures ([place]) 10/12/2018  . 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 MDD (major depressive disorder), recurrent, severe, with psychosis ([place]) 10/11/2017  . 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 Left flank pain 06/21/2017  . Bronchitis 06/01/2017  . Tylenol ingestion 06/01/2017  . Clostridium difficile colitis 05/29/2017  . Abdominal pain 05/28/2017  . Morbid obesity ([place]) 05/24/2017  . Pleuritis 05/19/2017  . Pleural effusion 05/19/2017  . Schizoaffective disorder, depressive type ([place]) 02/04/2017  . Gastroesophageal reflux 01/24/2017  . Obesity, Class II, BMI 35-39.9 01/24/2017  . Suicidal behavior with attempted self-injury ([place]) 01/24/2017  . HTN (hypertension) 03/30/2016  . Serotonin syndrome 03/30/2016  . Elevated blood pressure 03/30/2016  . Leukocytosis 03/30/2016  . Precordial chest pain 03/30/2016  . Incisional hernia 12/24/2015  . Hyperprolactinemia ([place]) 09/11/2015  . Schizoaffective disorder, bipolar type ([place]) 09/10/2015  . Hx of borderline personality disorder 09/10/2015  . Post traumatic stress disorder (PTSD) 09/10/2015  . Attempted suicide ([place]) 09/05/2015  . Prolonged Q-T interval on ECG   . Weight gain 03/06/2015  . Flushing 03/06/2015  . Severe episode of recurrent major depressive disorder ([place]) 08/23/2014  . Iron deficiency anemia 11/15/2013  . Alcohol dependence ([place]) 06/02/2013  . Liver mass 05/28/2013  . Medication side effects 05/07/2013  . Headache 04/03/2013  . Hx  MRSA infection 04/03/2013  . Postop check 03/13/2013  . Postoperative wound infection 02/23/2013  . History of colostomy 02/16/2013  . Borderline personality disorder ([place]) 08/16/2012    Class: Chronic  . Acute blood loss anemia 08/01/2012  . Major depressive disorder 08/01/2012  . Kidney stone 03/31/2012  . Hydronephrosis 03/31/2012    Current Outpatient Medications on File Prior to Visit  Medication Sig Dispense Refill  . busPIRone (BUSPAR) 10 MG tablet Take 10 mg by mouth 3 (three) times daily.    . clotrimazole-betamethasone (LOTRISONE) cream 1 APPLICATION TO AFFECTED AREA TWICE A DAY AS NEEDED EXTERNALLY, THINLY 30 DAYS  3  . neomycin-polymyxin-hydrocortisone (CORTISPORIN) OTIC solution Apply 1 to 2 drops twice daily until gone to right great toenail 10 mL 0  . Oxcarbazepine (TRILEPTAL) 300 MG tablet Take 1 tablet (300 mg total) by mouth 2 (two) times daily. 60 tablet 0  . QUEtiapine (SEROQUEL) 200 MG tablet Take 1 tablet (200 mg total) by mouth at bedtime. 30 tablet 0   No current facility-administered medications on file prior to visit.     Allergies  Allergen Reactions  . Augmentin [Amoxicillin-Pot Clavulanate] Itching, Swelling, Rash and Other (See Comments)    Has patient had a PCN reaction causing immediate rash, facial/tongue/throat swelling, SOB or lightheadedness with hypotension: Yes Has patient had a PCN reaction causing severe rash involving mucus membranes or skin necrosis: No Has patient had a PCN reaction that required hospitalization No Has patient had a PCN reaction occurring within the last 10 years: Yes 2016 If all of the above answers are "NO", then may proceed with Cephalosporin use.  . Enoxaparin Hives  . Avelox [Moxifloxacin Hcl In Nacl] Itching, Swelling and Rash  . Penicillins Itching, Swelling, Rash and Other (See Comments)  Has patient had a PCN reaction causing immediate rash, facial/tongue/throat swelling, SOB or lightheadedness with hypotension:  Yes Has patient had a PCN reaction causing severe rash involving mucus membranes or skin necrosis: No Has patient had a PCN reaction that required hospitalization: Already in hospital when reaction happened Has patient had a PCN reaction occurring within the last 10 years: Unknown If all of the above answers are "NO", then may proceed with Cephalosporin use.   . Sodium Hydroxide Rash    Objective:  General: Well developed, nourished, in no acute distress, alert and oriented x3   Dermatology: Skin is warm, dry and supple bilateral.  Right hallux nail bed appears to be clean, dry, with mild granular tissue and surrounding eschar/scab.  Faint erythema.  No edema.  Very minimal clear to yellow drainage present. The remaining nails appear unremarkable at this time. There are no other lesions or other signs of infection  present.  Neurovascular status: Intact. No lower extremity swelling; No pain with calf compression bilateral.  Musculoskeletal: Decreased tenderness to palpation of the right hallux nail bed.  Muscular strength within normal limits bilateral.   Assesement and Plan: Problem List Items Addressed This Visit    None    Visit Diagnoses    S/P nail surgery    -  Primary   Onychodystrophy       Ingrown nail       Great toe pain, right          -Examined patient  -Cleansed right hallux nail bed and gently scrubbed with peroxide and q-tip/curetted away eschar at site and applied antibiotic cream covered with gauze and paper tape. -Discussed plan of care with patient. -Patient to now begin soaking in a weak solution of Epsom salt and warm water once a day. Patient was instructed to soak for 15-20 minutes each day until the toe appears normal and there is no drainage, redness, tenderness, or swelling at the procedure site, and apply Corticosporin until gone and then switch to Neosporin and a gauze or bandaid dressing each day as needed. May leave open to air at night. -Educated  patient on long term care after nail surgery. -Patient was instructed to monitor the toe for reoccurrence and signs of infection; Patient advised to return to office or go to ER if toe becomes red, hot or swollen. -Patient is to return as needed or sooner if problems arise.  Landis Martins, DPM

## 2018-11-01 DIAGNOSIS — F29 Unspecified psychosis not due to a substance or known physiological condition: Secondary | ICD-10-CM | POA: Diagnosis not present

## 2018-11-01 DIAGNOSIS — F603 Borderline personality disorder: Secondary | ICD-10-CM | POA: Diagnosis not present

## 2018-11-02 ENCOUNTER — Encounter: Payer: Self-pay | Admitting: Sports Medicine

## 2018-11-02 ENCOUNTER — Ambulatory Visit (INDEPENDENT_AMBULATORY_CARE_PROVIDER_SITE_OTHER): Payer: Medicare Other

## 2018-11-02 ENCOUNTER — Ambulatory Visit (INDEPENDENT_AMBULATORY_CARE_PROVIDER_SITE_OTHER): Payer: Medicare Other | Admitting: Sports Medicine

## 2018-11-02 VITALS — BP 107/58 | HR 78 | Temp 97.6°F | Resp 16

## 2018-11-02 DIAGNOSIS — L600 Ingrowing nail: Secondary | ICD-10-CM

## 2018-11-02 DIAGNOSIS — L02611 Cutaneous abscess of right foot: Secondary | ICD-10-CM

## 2018-11-02 DIAGNOSIS — M79671 Pain in right foot: Secondary | ICD-10-CM | POA: Diagnosis not present

## 2018-11-02 DIAGNOSIS — Z9889 Other specified postprocedural states: Secondary | ICD-10-CM

## 2018-11-02 DIAGNOSIS — L03031 Cellulitis of right toe: Secondary | ICD-10-CM

## 2018-11-02 DIAGNOSIS — M79674 Pain in right toe(s): Secondary | ICD-10-CM | POA: Diagnosis not present

## 2018-11-02 MED ORDER — NEOMYCIN-POLYMYXIN-HC 3.5-10000-1 OT SOLN: OTIC | 0 refills | Status: DC

## 2018-11-02 MED ORDER — CLINDAMYCIN HCL 300 MG PO CAPS: 300.0000 mg | ORAL_CAPSULE | Freq: Three times a day (TID) | ORAL | 0 refills | Status: DC

## 2018-11-02 NOTE — Progress Notes (Signed)
Subjective: [name] [name] is a 47 y.o. female patient returns to office today for follow up evaluation after having Right Hallux permanent total nail avulsion performed on (10-12-18). Patient reports that she became concerned today when she saw the redness spreading and states that the pain has increased with burning that is constant with some swelling redness and some drainage that she thinks may be post.  Patient states that she has had nausea and fever this morning and did not get a chance to soak her toe before going to [place] earlier and was going to do it when she came home this evening however when she took her sock and shoe off she felt like her toe was more painful and red so decided to come to office for a walk-in visit.  Patient denies any other pedal complaints at this time.   Patient Active Problem List   Diagnosis Date Noted  . Seizures ([place]) 10/12/2018  . 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[place]) 04/05/2018  . MDD (major depressive disorder), recurrent, severe, with psychosis ([place]) 10/11/2017  . 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 Left flank pain 06/21/2017  . Bronchitis 06/01/2017  . Tylenol ingestion 06/01/2017  . Clostridium difficile colitis 05/29/2017  . Abdominal pain 05/28/2017  . Morbid obesity ([place]) 05/24/2017  . Pleuritis 05/19/2017  . Pleural effusion 05/19/2017  . Schizoaffective disorder, depressive type ([place]) 02/04/2017  . Gastroesophageal reflux 01/24/2017  . Obesity, Class II, BMI 35-39.9 01/24/2017  . Suicidal behavior with attempted self-injury ([place]) 01/24/2017  . HTN (hypertension) 03/30/2016  . Serotonin syndrome 03/30/2016  . Elevated blood pressure 03/30/2016  . Leukocytosis 03/30/2016  . Precordial chest pain 03/30/2016  . Incisional hernia 12/24/2015  . Hyperprolactinemia ([place]) 09/11/2015  . Schizoaffective disorder, bipolar type ([place]) 09/10/2015  . Hx of borderline personality disorder 09/10/2015  . Post traumatic stress disorder  (PTSD) 09/10/2015  . Attempted suicide ([place]) 09/05/2015  . Prolonged Q-T interval on ECG   . Weight gain 03/06/2015  . [place] 03/06/2015  . Severe episode of recurrent major depressive disorder ([place]) 08/23/2014  . Iron deficiency anemia 11/15/2013  . Alcohol dependence ([place]) 06/02/2013  . Liver mass 05/28/2013  . Medication side effects 05/07/2013  . Headache 04/03/2013  . Hx MRSA infection 04/03/2013  . Postop check 03/13/2013  . Postoperative wound infection 02/23/2013  . History of colostomy 02/16/2013  . Borderline personality disorder ([place]) 08/16/2012    Class: Chronic  . Acute blood loss anemia 08/01/2012  . Major depressive disorder 08/01/2012  . Kidney stone 03/31/2012  . Hydronephrosis 03/31/2012    Current Outpatient Medications on File Prior to Visit  Medication Sig Dispense Refill  . busPIRone (BUSPAR) 10 MG tablet Take 10 mg by mouth 3 (three) times daily.    . clotrimazole-betamethasone (LOTRISONE) cream 1 APPLICATION TO AFFECTED AREA TWICE A DAY AS NEEDED EXTERNALLY, THINLY 30 DAYS  3  . Oxcarbazepine (TRILEPTAL) 300 MG tablet Take 1 tablet (300 mg total) by mouth 2 (two) times daily. 60 tablet 0  . QUEtiapine (SEROQUEL) 200 MG tablet Take 1 tablet (200 mg total) by mouth at bedtime. 30 tablet 0   No current facility-administered medications on file prior to visit.     Allergies  Allergen Reactions  . Augmentin [Amoxicillin-Pot Clavulanate] Itching, Swelling, Rash and Other (See Comments)    Has patient had a PCN reaction causing immediate rash, facial/tongue/throat swelling, SOB or lightheadedness with hypotension: Yes Has patient had a PCN reaction causing severe rash involving mucus membranes or skin necrosis:  No Has patient had a PCN reaction that required hospitalization No Has patient had a PCN reaction occurring within the last 10 years: Yes 2016 If all of the above answers are "NO", then may proceed with Cephalosporin use.  . Enoxaparin Hives  .  Avelox [Moxifloxacin Hcl In Nacl] Itching, Swelling and Rash  . Penicillins Itching, Swelling, Rash and Other (See Comments)    Has patient had a PCN reaction causing immediate rash, facial/tongue/throat swelling, SOB or lightheadedness with hypotension: Yes Has patient had a PCN reaction causing severe rash involving mucus membranes or skin necrosis: No Has patient had a PCN reaction that required hospitalization: Already in hospital when reaction happened Has patient had a PCN reaction occurring within the last 10 years: Unknown If all of the above answers are "NO", then may proceed with Cephalosporin use.   . Sodium Hydroxide Rash    Objective:  General: Well developed, nourished, in no acute distress, alert and oriented x3   Dermatology: Skin is warm, dry and supple bilateral.  Right hallux nail bed appears to be clean, dry, with mild granular tissue and surrounding eschar/scab.  Faint blanchable erythema in nature to the level of the hallux IPJ.  Focal edema.  Very minimal clear to yellow drainage present.  No pus.  The remaining nails appear unremarkable at this time. There are no other lesions or other signs of infection present.  Neurovascular status: Intact. No lower extremity swelling; No pain with calf compression bilateral.  Musculoskeletal: Mild tenderness to palpation of the right hallux nail bed.  Muscular strength within normal limits bilateral.   X-ray right foot no acute findings at the right hallux at area of pain  Assesement and Plan: Problem List Items Addressed This Visit    None    Visit Diagnoses    Right foot pain    -  Primary   Relevant Orders   DG Foot Complete Right   Great toe pain, right       S/P nail surgery       Cellulitis and abscess of toe of right foot       Relevant Medications   clindamycin (CLEOCIN) 300 MG capsule   Ingrown nail       Relevant Medications   neomycin-polymyxin-hydrocortisone (CORTISPORIN) OTIC solution      -Examined  patient  -X-rays reviewed -Cleansed right hallux nail bed and gently scrubbed with peroxide and q-tip/curetted away eschar at site and applied antibiotic cream covered with gauze and a light layer Coban wrap to toe -Discussed plan of care with patient. -Prescribed clindamycin due to increased episodes of pain and focal erythema in the setting of patient having nausea and fever and not otherwise explained however in office patient's temperature today is 97.6 -Patient to continue soaking in a weak solution of Epsom salt and warm water once a day. Patient was instructed to soak for 15-20 minutes each day until the toe appears normal and there is no drainage, redness, tenderness, or swelling at the procedure site, and apply Corticosporin as I have refilled this visit and a gauze or bandaid dressing each day as needed. May leave open to air at night and may use a Q-tip to help keep the area clean just after soaks. -Educated patient on long term care after nail surgery. -Recommend Tylenol or Motrin for pain and advised patient the time that it takes to heal after a total avulsion procedure -Patient was instructed to monitor the toe for reoccurrence and signs of infection;  Patient advised to return to office or go to ER if toe becomes red, hot or swollen. -Patient is to return in 2 weeks or as needed or sooner if problems arise.  Landis Martins, DPM

## 2018-11-03 ENCOUNTER — Other Ambulatory Visit: Payer: Self-pay | Admitting: Sports Medicine

## 2018-11-03 DIAGNOSIS — M79671 Pain in right foot: Secondary | ICD-10-CM

## 2018-11-03 DIAGNOSIS — M79674 Pain in right toe(s): Secondary | ICD-10-CM

## 2018-11-03 DIAGNOSIS — L03119 Cellulitis of unspecified part of limb: Secondary | ICD-10-CM

## 2018-11-03 DIAGNOSIS — F29 Unspecified psychosis not due to a substance or known physiological condition: Secondary | ICD-10-CM | POA: Diagnosis not present

## 2018-11-03 DIAGNOSIS — L02619 Cutaneous abscess of unspecified foot: Secondary | ICD-10-CM

## 2018-11-09 DIAGNOSIS — F29 Unspecified psychosis not due to a substance or known physiological condition: Secondary | ICD-10-CM | POA: Diagnosis not present

## 2018-11-15 DIAGNOSIS — F603 Borderline personality disorder: Secondary | ICD-10-CM | POA: Diagnosis not present

## 2018-11-16 DIAGNOSIS — F29 Unspecified psychosis not due to a substance or known physiological condition: Secondary | ICD-10-CM | POA: Diagnosis not present

## 2018-11-17 ENCOUNTER — Ambulatory Visit: Payer: Medicare Other | Admitting: Sports Medicine

## 2018-11-29 DIAGNOSIS — F603 Borderline personality disorder: Secondary | ICD-10-CM | POA: Diagnosis not present

## 2018-11-30 DIAGNOSIS — F29 Unspecified psychosis not due to a substance or known physiological condition: Secondary | ICD-10-CM | POA: Diagnosis not present

## 2018-12-05 DIAGNOSIS — J069 Acute upper respiratory infection, unspecified: Secondary | ICD-10-CM | POA: Diagnosis not present

## 2018-12-05 DIAGNOSIS — R918 Other nonspecific abnormal finding of lung field: Secondary | ICD-10-CM | POA: Diagnosis not present

## 2018-12-05 DIAGNOSIS — R091 Pleurisy: Secondary | ICD-10-CM | POA: Diagnosis not present

## 2018-12-06 ENCOUNTER — Encounter (HOSPITAL_COMMUNITY): Payer: Self-pay | Admitting: Emergency Medicine

## 2018-12-06 ENCOUNTER — Emergency Department (HOSPITAL_COMMUNITY): Payer: Medicare Other

## 2018-12-06 ENCOUNTER — Ambulatory Visit (HOSPITAL_BASED_OUTPATIENT_CLINIC_OR_DEPARTMENT_OTHER): Payer: Medicare Other

## 2018-12-06 ENCOUNTER — Emergency Department (HOSPITAL_COMMUNITY)
Admission: EM | Admit: 2018-12-06 | Discharge: 2018-12-06 | Disposition: A | Discharge status: Home / Self Care | Payer: Medicare Other | Attending: Emergency Medicine | Admitting: Emergency Medicine

## 2018-12-06 ENCOUNTER — Other Ambulatory Visit: Payer: Self-pay

## 2018-12-06 DIAGNOSIS — Z85828 Personal history of other malignant neoplasm of skin: Secondary | ICD-10-CM | POA: Insufficient documentation

## 2018-12-06 DIAGNOSIS — J181 Lobar pneumonia, unspecified organism: Secondary | ICD-10-CM | POA: Diagnosis not present

## 2018-12-06 DIAGNOSIS — Z79899 Other long term (current) drug therapy: Secondary | ICD-10-CM | POA: Insufficient documentation

## 2018-12-06 DIAGNOSIS — M79672 Pain in left foot: Secondary | ICD-10-CM | POA: Diagnosis not present

## 2018-12-06 DIAGNOSIS — M7989 Other specified soft tissue disorders: Secondary | ICD-10-CM

## 2018-12-06 DIAGNOSIS — R05 Cough: Secondary | ICD-10-CM | POA: Diagnosis not present

## 2018-12-06 DIAGNOSIS — R071 Chest pain on breathing: Secondary | ICD-10-CM

## 2018-12-06 DIAGNOSIS — R609 Edema, unspecified: Secondary | ICD-10-CM

## 2018-12-06 DIAGNOSIS — J189 Pneumonia, unspecified organism: Secondary | ICD-10-CM

## 2018-12-06 DIAGNOSIS — I10 Essential (primary) hypertension: Secondary | ICD-10-CM | POA: Insufficient documentation

## 2018-12-06 DIAGNOSIS — R0602 Shortness of breath: Secondary | ICD-10-CM | POA: Diagnosis not present

## 2018-12-06 DIAGNOSIS — R079 Chest pain, unspecified: Secondary | ICD-10-CM | POA: Diagnosis not present

## 2018-12-06 LAB — COMPREHENSIVE METABOLIC PANEL
ALBUMIN: 3.3 g/dL — AB (ref 3.5–5.0)
ALT: 38 U/L (ref 0–44)
AST: 59 U/L — ABNORMAL HIGH (ref 15–41)
Alkaline Phosphatase: 145 U/L — ABNORMAL HIGH (ref 38–126)
Anion gap: 9 (ref 5–15)
BILIRUBIN TOTAL: 0.6 mg/dL (ref 0.3–1.2)
BUN: 8 mg/dL (ref 6–20)
CALCIUM: 9 mg/dL (ref 8.9–10.3)
CO2: 24 mmol/L (ref 22–32)
CREATININE: 0.87 mg/dL (ref 0.44–1.00)
Chloride: 108 mmol/L (ref 98–111)
GFR calc Af Amer: >60 mL/min (ref >60)
Glucose, Bld: 124 mg/dL — ABNORMAL HIGH (ref 70–99)
POTASSIUM: 3.8 mmol/L (ref 3.5–5.1)
SODIUM: 141 mmol/L (ref 135–145)
Total Protein: 6.1 g/dL — ABNORMAL LOW (ref 6.5–8.1)

## 2018-12-06 LAB — CBC WITH DIFFERENTIAL/PLATELET
Abs Immature Granulocytes: 0.03 10*3/uL (ref 0.00–0.07)
Basophils Absolute: 0.1 10*3/uL (ref 0.0–0.1)
Basophils Relative: 1 %
Eosinophils Absolute: 0.1 10*3/uL (ref 0.0–0.5)
Eosinophils Relative: 1 %
HCT: 34.5 % — ABNORMAL LOW (ref 36.0–46.0)
Hemoglobin: 10.6 g/dL — ABNORMAL LOW (ref 12.0–15.0)
Immature Granulocytes: 0 %
Lymphocytes Relative: 10 %
Lymphs Abs: 0.9 10*3/uL (ref 0.7–4.0)
MCH: 26.4 pg (ref 26.0–34.0)
MCHC: 30.7 g/dL (ref 30.0–36.0)
MCV: 86 fL (ref 80.0–100.0)
MONO ABS: 1 10*3/uL (ref 0.1–1.0)
Monocytes Relative: 10 %
NEUTROS PCT: 78 %
Neutro Abs: 7.7 10*3/uL (ref 1.7–7.7)
Platelets: 262 10*3/uL (ref 150–400)
RBC: 4.01 MIL/uL (ref 3.87–5.11)
RDW: 13.7 % (ref 11.5–15.5)
WBC: 9.8 10*3/uL (ref 4.0–10.5)
nRBC: 0 % (ref 0.0–0.2)

## 2018-12-06 LAB — I-STAT TROPONIN, ED
Troponin i, poc: 0 ng/mL (ref 0.00–0.08)
Troponin i, poc: 0 ng/mL (ref 0.00–0.08)

## 2018-12-06 LAB — I-STAT BETA HCG BLOOD, ED (MC, WL, AP ONLY): HCG, QUANTITATIVE: 10.1 m[IU]/mL — AB (ref <5)

## 2018-12-06 LAB — D-DIMER, QUANTITATIVE: D-Dimer, Quant: 0.63 ug/mL-FEU — ABNORMAL HIGH (ref 0.00–0.50)

## 2018-12-06 MED ORDER — CEPHALEXIN 500 MG PO CAPS: 500.0000 mg | ORAL_CAPSULE | Freq: Four times a day (QID) | ORAL | 0 refills | Status: AC

## 2018-12-06 MED ORDER — IOPAMIDOL (ISOVUE-370) INJECTION 76%
100.0000 mL | Freq: Once | INTRAVENOUS | Status: AC | PRN
  Administered 2018-12-06: 100 mL via INTRAVENOUS

## 2018-12-06 MED ORDER — IOPAMIDOL (ISOVUE-370) INJECTION 76%
INTRAVENOUS | Status: AC
  Filled 2018-12-06: qty 100

## 2018-12-06 MED ORDER — AZITHROMYCIN 250 MG PO TABS: 250.0000 mg | ORAL_TABLET | Freq: Every day | ORAL | 0 refills | Status: DC
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
## 2018-12-06 NOTE — Progress Notes (Signed)
Left lower extremity duplex completed.    Preliminary results in CV Proc.    Results given to [facility], RN  Abram Sander 12/06/2018 5:42 PM

## 2018-12-06 NOTE — ED Notes (Signed)
Patient verbalizes understanding of discharge instructions. Opportunity for questioning and answers were provided. Armband removed by staff, pt discharged from ED ambulatory.   

## 2018-12-06 NOTE — ED Triage Notes (Signed)
Pt c/o intermittent chest pain and shortness of breath. Pain worse with movement. Seen at [facility] yesterday for same, reports symptoms worsened today. Reports fever and cough last night.

## 2018-12-06 NOTE — Discharge Instructions (Addendum)
Thank you for allowing [place] taking care of you at [facility] emergency department.  You came in due to chest pain. You found to have pneumonia. We also checked your heart enzyme, performed EKG and and work up for possible clot in your lung that all came back reassuring.  As we discussed we discharge you with oral antibiotic and recommend you to follow up with your primary care in 3-4 weeks to repeat CXR as well as pregnancy test. If did not improve in next 2-3 days or your symptoms got worse, please return to emergency room. Thank you

## 2018-12-13 DIAGNOSIS — F603 Borderline personality disorder: Secondary | ICD-10-CM | POA: Diagnosis not present

## 2018-12-14 ENCOUNTER — Other Ambulatory Visit: Payer: Self-pay

## 2018-12-14 ENCOUNTER — Encounter (HOSPITAL_COMMUNITY): Payer: Self-pay

## 2018-12-14 ENCOUNTER — Emergency Department (HOSPITAL_COMMUNITY)
Admission: EM | Admit: 2018-12-14 | Discharge: 2018-12-14 | Disposition: A | Discharge status: Home / Self Care | Payer: Medicare Other | Attending: Emergency Medicine | Admitting: Emergency Medicine

## 2018-12-14 ENCOUNTER — Emergency Department (HOSPITAL_COMMUNITY): Payer: Medicare Other

## 2018-12-14 DIAGNOSIS — Z79899 Other long term (current) drug therapy: Secondary | ICD-10-CM | POA: Diagnosis not present

## 2018-12-14 DIAGNOSIS — F29 Unspecified psychosis not due to a substance or known physiological condition: Secondary | ICD-10-CM | POA: Diagnosis not present

## 2018-12-14 DIAGNOSIS — I10 Essential (primary) hypertension: Secondary | ICD-10-CM | POA: Insufficient documentation

## 2018-12-14 DIAGNOSIS — J181 Lobar pneumonia, unspecified organism: Secondary | ICD-10-CM | POA: Diagnosis not present

## 2018-12-14 DIAGNOSIS — Z86718 Personal history of other venous thrombosis and embolism: Secondary | ICD-10-CM | POA: Insufficient documentation

## 2018-12-14 DIAGNOSIS — R079 Chest pain, unspecified: Secondary | ICD-10-CM | POA: Diagnosis not present

## 2018-12-14 DIAGNOSIS — E785 Hyperlipidemia, unspecified: Secondary | ICD-10-CM | POA: Insufficient documentation

## 2018-12-14 DIAGNOSIS — J189 Pneumonia, unspecified organism: Secondary | ICD-10-CM | POA: Diagnosis not present

## 2018-12-14 DIAGNOSIS — J90 Pleural effusion, not elsewhere classified: Secondary | ICD-10-CM | POA: Diagnosis not present

## 2018-12-14 DIAGNOSIS — R0602 Shortness of breath: Secondary | ICD-10-CM | POA: Diagnosis not present

## 2018-12-14 DIAGNOSIS — R05 Cough: Secondary | ICD-10-CM | POA: Diagnosis present

## 2018-12-14 LAB — CBC
HCT: 38.5 % (ref 36.0–46.0)
Hemoglobin: 11.7 g/dL — ABNORMAL LOW (ref 12.0–15.0)
MCH: 26.3 pg (ref 26.0–34.0)
MCHC: 30.4 g/dL (ref 30.0–36.0)
MCV: 86.5 fL (ref 80.0–100.0)
PLATELETS: 346 10*3/uL (ref 150–400)
RBC: 4.45 MIL/uL (ref 3.87–5.11)
RDW: 13.6 % (ref 11.5–15.5)
WBC: 7.7 10*3/uL (ref 4.0–10.5)
nRBC: 0 % (ref 0.0–0.2)

## 2018-12-14 LAB — BASIC METABOLIC PANEL
Anion gap: 9 (ref 5–15)
BUN: 8 mg/dL (ref 6–20)
CALCIUM: 9 mg/dL (ref 8.9–10.3)
CO2: 24 mmol/L (ref 22–32)
Chloride: 107 mmol/L (ref 98–111)
Creatinine, Ser: 0.87 mg/dL (ref 0.44–1.00)
GFR calc non Af Amer: >60 mL/min (ref >60)
Glucose, Bld: 100 mg/dL — ABNORMAL HIGH (ref 70–99)
Potassium: 4 mmol/L (ref 3.5–5.1)
Sodium: 140 mmol/L (ref 135–145)

## 2018-12-14 LAB — I-STAT TROPONIN, ED: Troponin i, poc: 0 ng/mL (ref 0.00–0.08)

## 2018-12-14 LAB — I-STAT BETA HCG BLOOD, ED (MC, WL, AP ONLY): I-stat hCG, quantitative: <5 m[IU]/mL (ref <5)

## 2018-12-14 MED ORDER — DOXYCYCLINE HYCLATE 100 MG PO CAPS: 100.0000 mg | ORAL_CAPSULE | Freq: Two times a day (BID) | ORAL | 0 refills | Status: DC

## 2018-12-14 NOTE — ED Triage Notes (Signed)
Pt here for persistent shortness of breath and chest pain.  Seen previously on the 11th for the same.  A&Ox4, ambulatory to triage.

## 2018-12-14 NOTE — Discharge Instructions (Signed)
Chest x-ray seems to show some improvement of the pneumonia.  But since you are having chills we will go ahead and continue a different course of antibiotics for the next 7 days.  Your pregnancy test was negative.  Take the doxycycline as directed for the next 7 days.  Make an appointment to follow-up with your primary care doctor to make sure there is resolution of the pneumonia.  Return for any new or worse symptoms.
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
## 2018-12-29 ENCOUNTER — Encounter (HOSPITAL_COMMUNITY): Payer: Self-pay | Admitting: *Deleted

## 2018-12-29 ENCOUNTER — Inpatient Hospital Stay (HOSPITAL_COMMUNITY)
Admission: AD | Admit: 2018-12-29 | Discharge: 2019-01-02 | DRG: 885 | Disposition: A | Discharge status: Home / Self Care | Payer: Medicare Other | Source: Intra-hospital | Attending: Psychiatry | Admitting: Psychiatry

## 2018-12-29 ENCOUNTER — Emergency Department (HOSPITAL_COMMUNITY)
Admission: EM | Admit: 2018-12-29 | Discharge: 2018-12-29 | Disposition: A | Discharge status: Home / Self Care | Payer: Medicare Other | Attending: Emergency Medicine | Admitting: Emergency Medicine

## 2018-12-29 ENCOUNTER — Other Ambulatory Visit: Payer: Self-pay

## 2018-12-29 DIAGNOSIS — Z91048 Other nonmedicinal substance allergy status: Secondary | ICD-10-CM

## 2018-12-29 DIAGNOSIS — F323 Major depressive disorder, single episode, severe with psychotic features: Secondary | ICD-10-CM | POA: Insufficient documentation

## 2018-12-29 DIAGNOSIS — E785 Hyperlipidemia, unspecified: Secondary | ICD-10-CM | POA: Insufficient documentation

## 2018-12-29 DIAGNOSIS — F419 Anxiety disorder, unspecified: Secondary | ICD-10-CM | POA: Diagnosis present

## 2018-12-29 DIAGNOSIS — Z792 Long term (current) use of antibiotics: Secondary | ICD-10-CM

## 2018-12-29 DIAGNOSIS — R443 Hallucinations, unspecified: Secondary | ICD-10-CM

## 2018-12-29 DIAGNOSIS — I10 Essential (primary) hypertension: Secondary | ICD-10-CM | POA: Insufficient documentation

## 2018-12-29 DIAGNOSIS — Z91411 Personal history of adult psychological abuse: Secondary | ICD-10-CM

## 2018-12-29 DIAGNOSIS — F251 Schizoaffective disorder, depressive type: Secondary | ICD-10-CM | POA: Diagnosis present

## 2018-12-29 DIAGNOSIS — Z818 Family history of other mental and behavioral disorders: Secondary | ICD-10-CM

## 2018-12-29 DIAGNOSIS — Z62820 Parent-biological child conflict: Secondary | ICD-10-CM | POA: Diagnosis present

## 2018-12-29 DIAGNOSIS — Z915 Personal history of self-harm: Secondary | ICD-10-CM

## 2018-12-29 DIAGNOSIS — Z881 Allergy status to other antibiotic agents status: Secondary | ICD-10-CM | POA: Diagnosis not present

## 2018-12-29 DIAGNOSIS — F515 Nightmare disorder: Secondary | ICD-10-CM | POA: Diagnosis present

## 2018-12-29 DIAGNOSIS — Z9884 Bariatric surgery status: Secondary | ICD-10-CM

## 2018-12-29 DIAGNOSIS — Z880 Allergy status to penicillin: Secondary | ICD-10-CM

## 2018-12-29 DIAGNOSIS — J189 Pneumonia, unspecified organism: Secondary | ICD-10-CM

## 2018-12-29 DIAGNOSIS — J45909 Unspecified asthma, uncomplicated: Secondary | ICD-10-CM | POA: Diagnosis present

## 2018-12-29 DIAGNOSIS — Z86711 Personal history of pulmonary embolism: Secondary | ICD-10-CM

## 2018-12-29 DIAGNOSIS — F4310 Post-traumatic stress disorder, unspecified: Secondary | ICD-10-CM | POA: Diagnosis present

## 2018-12-29 DIAGNOSIS — R45851 Suicidal ideations: Secondary | ICD-10-CM | POA: Diagnosis present

## 2018-12-29 DIAGNOSIS — Z79899 Other long term (current) drug therapy: Secondary | ICD-10-CM | POA: Diagnosis not present

## 2018-12-29 DIAGNOSIS — Z91410 Personal history of adult physical and sexual abuse: Secondary | ICD-10-CM | POA: Diagnosis not present

## 2018-12-29 DIAGNOSIS — F410 Panic disorder [episodic paroxysmal anxiety] without agoraphobia: Secondary | ICD-10-CM | POA: Diagnosis present

## 2018-12-29 DIAGNOSIS — Z86718 Personal history of other venous thrombosis and embolism: Secondary | ICD-10-CM | POA: Insufficient documentation

## 2018-12-29 DIAGNOSIS — F209 Schizophrenia, unspecified: Secondary | ICD-10-CM | POA: Insufficient documentation

## 2018-12-29 DIAGNOSIS — G4700 Insomnia, unspecified: Secondary | ICD-10-CM | POA: Diagnosis present

## 2018-12-29 DIAGNOSIS — R440 Auditory hallucinations: Secondary | ICD-10-CM | POA: Insufficient documentation

## 2018-12-29 DIAGNOSIS — F603 Borderline personality disorder: Secondary | ICD-10-CM | POA: Diagnosis present

## 2018-12-29 LAB — I-STAT BETA HCG BLOOD, ED (MC, WL, AP ONLY): I-stat hCG, quantitative: <5 m[IU]/mL (ref <5)

## 2018-12-29 LAB — COMPREHENSIVE METABOLIC PANEL
ALT: 18 U/L (ref 0–44)
AST: 15 U/L (ref 15–41)
Albumin: 3.8 g/dL (ref 3.5–5.0)
Alkaline Phosphatase: 123 U/L (ref 38–126)
Anion gap: 4 — ABNORMAL LOW (ref 5–15)
BUN: 8 mg/dL (ref 6–20)
CO2: 27 mmol/L (ref 22–32)
CREATININE: 0.87 mg/dL (ref 0.44–1.00)
Calcium: 9.2 mg/dL (ref 8.9–10.3)
Chloride: 107 mmol/L (ref 98–111)
GFR calc Af Amer: >60 mL/min (ref >60)
GFR calc non Af Amer: >60 mL/min (ref >60)
Glucose, Bld: 113 mg/dL — ABNORMAL HIGH (ref 70–99)
Potassium: 3.6 mmol/L (ref 3.5–5.1)
Sodium: 138 mmol/L (ref 135–145)
Total Bilirubin: 0.3 mg/dL (ref 0.3–1.2)
Total Protein: 6.8 g/dL (ref 6.5–8.1)

## 2018-12-29 LAB — CBC
HEMATOCRIT: 35.8 % — AB (ref 36.0–46.0)
Hemoglobin: 11.4 g/dL — ABNORMAL LOW (ref 12.0–15.0)
MCH: 27.5 pg (ref 26.0–34.0)
MCHC: 31.8 g/dL (ref 30.0–36.0)
MCV: 86.3 fL (ref 80.0–100.0)
NRBC: 0 % (ref 0.0–0.2)
Platelets: 382 10*3/uL (ref 150–400)
RBC: 4.15 MIL/uL (ref 3.87–5.11)
RDW: 13.8 % (ref 11.5–15.5)
WBC: 6.1 10*3/uL (ref 4.0–10.5)

## 2018-12-29 LAB — RAPID URINE DRUG SCREEN, HOSP PERFORMED
Amphetamines: NOT DETECTED
BENZODIAZEPINES: NOT DETECTED
Barbiturates: NOT DETECTED
Cocaine: NOT DETECTED
Opiates: NOT DETECTED
Tetrahydrocannabinol: NOT DETECTED

## 2018-12-29 LAB — ACETAMINOPHEN LEVEL: Acetaminophen (Tylenol), Serum: <10 ug/mL — ABNORMAL LOW (ref 10–30)

## 2018-12-29 LAB — SALICYLATE LEVEL: Salicylate Lvl: <7 mg/dL (ref 2.8–30.0)

## 2018-12-29 LAB — ETHANOL: Alcohol, Ethyl (B): <10 mg/dL (ref <10)

## 2018-12-29 MED ORDER — BUSPIRONE HCL 10 MG PO TABS
10.0000 mg | ORAL_TABLET | Freq: Two times a day (BID) | ORAL | Status: DC
  Administered 2018-12-29: 10 mg via ORAL
  Filled 2018-12-29: qty 1

## 2018-12-29 MED ORDER — ALUM & MAG HYDROXIDE-SIMETH 200-200-20 MG/5ML PO SUSP: 30.0000 mL | ORAL | Status: DC | PRN

## 2018-12-29 MED ORDER — OXCARBAZEPINE 300 MG PO TABS
300.0000 mg | ORAL_TABLET | Freq: Two times a day (BID) | ORAL | Status: DC
  Administered 2018-12-30 – 2019-01-02 (×7): 300 mg via ORAL
  Filled 2018-12-29 (×11): qty 1

## 2018-12-29 MED ORDER — PRAZOSIN HCL 1 MG PO CAPS
1.0000 mg | ORAL_CAPSULE | Freq: Every day | ORAL | Status: DC
  Administered 2018-12-29: 1 mg via ORAL
  Filled 2018-12-29: qty 1

## 2018-12-29 MED ORDER — OXCARBAZEPINE 300 MG PO TABS
300.0000 mg | ORAL_TABLET | Freq: Two times a day (BID) | ORAL | Status: DC
  Administered 2018-12-29: 300 mg via ORAL
  Filled 2018-12-29: qty 1

## 2018-12-29 MED ORDER — QUETIAPINE FUMARATE 50 MG PO TABS
50.0000 mg | ORAL_TABLET | Freq: Three times a day (TID) | ORAL | Status: DC
  Filled 2018-12-29 (×4): qty 1

## 2018-12-29 MED ORDER — ACETAMINOPHEN 325 MG PO TABS
650.0000 mg | ORAL_TABLET | Freq: Four times a day (QID) | ORAL | Status: DC | PRN
  Administered 2018-12-30 – 2018-12-31 (×2): 650 mg via ORAL
  Filled 2018-12-29 (×2): qty 2

## 2018-12-29 MED ORDER — QUETIAPINE FUMARATE 50 MG PO TABS
50.0000 mg | ORAL_TABLET | Freq: Three times a day (TID) | ORAL | Status: DC
  Administered 2018-12-29: 50 mg via ORAL
  Filled 2018-12-29: qty 1

## 2018-12-29 MED ORDER — PRAZOSIN HCL 1 MG PO CAPS
1.0000 mg | ORAL_CAPSULE | Freq: Every day | ORAL | Status: DC
  Administered 2018-12-30: 1 mg via ORAL
  Filled 2018-12-29 (×4): qty 1

## 2018-12-29 MED ORDER — MAGNESIUM HYDROXIDE 400 MG/5ML PO SUSP: 30.0000 mL | Freq: Every day | ORAL | Status: DC | PRN

## 2018-12-29 MED ORDER — BUSPIRONE HCL 10 MG PO TABS
10.0000 mg | ORAL_TABLET | Freq: Two times a day (BID) | ORAL | Status: DC
  Administered 2018-12-30 – 2019-01-02 (×7): 10 mg via ORAL
  Filled 2018-12-29 (×11): qty 1

## 2018-12-29 NOTE — ED Notes (Signed)
Pt accepted at behavorial, 504-1 by Lindon Romp NP
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
## 2018-12-29 NOTE — ED Notes (Signed)
Pt's belongings retrieved from [place] [facility] and valuables retrieved from security in preparation for transport to [facility].

## 2018-12-29 NOTE — ED Notes (Signed)
Pt wanded, belonging placed in locker 6

## 2018-12-29 NOTE — ED Triage Notes (Signed)
Pt states she was told by Renee Ramus' to come to the ED. Pt very soft spoken and with very poor eye contact. States she is hearing voices and Renee Ramus is talking to her. Requests that no men touch her. States 'Renee Ramus' is telling her to use a gun. When asked if pt has a gun she states 'yes, it's right here' and points to her lap. There is no gun in lap or under shirt. Pt changed into scrubs and will have belongings wanded.
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
## 2018-12-29 NOTE — Progress Notes (Signed)
Per State regulations 482.30 this chart was reviewed for medical necessity with respect to the patient's admission/duration of stay.    Next review date:01/02/2019  Debarah Crape, BSN, RN-BC  Case Manager

## 2018-12-30 DIAGNOSIS — R45851 Suicidal ideations: Secondary | ICD-10-CM

## 2018-12-30 DIAGNOSIS — F251 Schizoaffective disorder, depressive type: Principal | ICD-10-CM

## 2018-12-30 LAB — HEMOGLOBIN A1C
HEMOGLOBIN A1C: 5.1 % (ref 4.8–5.6)
Mean Plasma Glucose: 99.67 mg/dL

## 2018-12-30 LAB — LIPID PANEL
Cholesterol: 211 mg/dL — ABNORMAL HIGH (ref 0–200)
HDL: 66 mg/dL (ref >40)
LDL Cholesterol: 137 mg/dL — ABNORMAL HIGH (ref 0–99)
Total CHOL/HDL Ratio: 3.2 RATIO
Triglycerides: 39 mg/dL (ref <150)
VLDL: 8 mg/dL (ref 0–40)

## 2018-12-30 LAB — TSH: TSH: 1.187 u[IU]/mL (ref 0.350–4.500)

## 2018-12-30 MED ORDER — ALBUTEROL SULFATE HFA 108 (90 BASE) MCG/ACT IN AERS
2.0000 | INHALATION_SPRAY | RESPIRATORY_TRACT | Status: DC | PRN
  Administered 2018-12-31: 2 via RESPIRATORY_TRACT

## 2018-12-30 MED ORDER — ONDANSETRON HCL 4 MG PO TABS
4.0000 mg | ORAL_TABLET | Freq: Three times a day (TID) | ORAL | Status: DC | PRN
  Administered 2018-12-30: 4 mg via ORAL
  Filled 2018-12-30: qty 1

## 2018-12-30 MED ORDER — QUETIAPINE FUMARATE 50 MG PO TABS
50.0000 mg | ORAL_TABLET | Freq: Every day | ORAL | Status: DC
  Administered 2018-12-30: 50 mg via ORAL
  Filled 2018-12-30 (×2): qty 1

## 2018-12-30 MED ORDER — QUETIAPINE FUMARATE 50 MG PO TABS
50.0000 mg | ORAL_TABLET | Freq: Two times a day (BID) | ORAL | Status: DC
  Administered 2018-12-30 – 2018-12-31 (×2): 50 mg via ORAL
  Filled 2018-12-30 (×4): qty 1

## 2018-12-30 MED ORDER — QUETIAPINE FUMARATE 400 MG PO TABS
400.0000 mg | ORAL_TABLET | Freq: Every day | ORAL | Status: DC
  Administered 2018-12-30 – 2018-12-31 (×2): 400 mg via ORAL
  Filled 2018-12-30 (×2): qty 1
  Filled 2018-12-30: qty 2
  Filled 2018-12-30: qty 1

## 2018-12-30 MED ORDER — BENZONATATE 100 MG PO CAPS: 200.0000 mg | ORAL_CAPSULE | Freq: Three times a day (TID) | ORAL | Status: DC | PRN

## 2018-12-30 MED ORDER — QUETIAPINE FUMARATE 200 MG PO TABS: 200.0000 mg | ORAL_TABLET | Freq: Every day | ORAL | Status: DC

## 2018-12-30 MED ORDER — CLONIDINE HCL 0.1 MG PO TABS
0.1000 mg | ORAL_TABLET | Freq: Every day | ORAL | Status: DC
  Administered 2018-12-30 – 2019-01-01 (×3): 0.1 mg via ORAL
  Filled 2018-12-30 (×6): qty 1

## 2018-12-30 NOTE — Progress Notes (Signed)
Pt has been in her room responding to voices. She was sitting in her room with her hands around her throat pulling on her sweater. MD was called and writer was told to give 2200 medications early. Pt took her medications and contracts for safety. Safety maintained on the unit.

## 2018-12-30 NOTE — Progress Notes (Signed)
Per nursing report, EKG to be completed. EKG completed and given to MD then placed in pt chart.

## 2018-12-30 NOTE — Plan of Care (Signed)
Pt presents with an animated affect and anxious mood. Pt expressed hearing the voice of [name] who's controlling her thoughts. Pt stated that [name] wants her to shoot herself and that she have a gun in her pocket. When writer asked pt to empty her pockets. There were no items found in the pt's pockets. Pt denies SI/HI. Pt denies any self harm thoughts.  Pt expressed she doesn't want to hurt herself and will talk to staff if [name] voices get louder. Pt reported that she's able to tolerate the voices at this time. Pt appears to be fidgety and minimal throughout the morning. Pt observed sitting in the dayroom this morning with minimal engagement.   Medications administered as ordered per MD. Medications reviewed with pt. Verbal support provided. Pt encouraged to attend groups. 15 minute checks performed for safety.   Pt compliant with tx plan.   Care Plan Problem: Safety: Goal: Periods of time without injury will increase Outcome: Progressing   Problem: Medication: Goal: Compliance with prescribed medication regimen will improve Outcome: Progressing

## 2018-12-30 NOTE — Tx Team (Signed)
Initial Treatment Plan 12/30/2018 12:53 AM [name] RFX:[id number]    PATIENT STRESSORS: Medication change or noncompliance Traumatic event   PATIENT STRENGTHS: Average or above average intelligence Communication skills Supportive family/friends   PATIENT IDENTIFIED PROBLEMS: Psychosis  Suicidal Ideation  Depression          "get better"       DISCHARGE CRITERIA:  Improved stabilization in mood, thinking, and/or behavior Motivation to continue treatment in a less acute level of care Need for constant or close observation no longer present Verbal commitment to aftercare and medication compliance  PRELIMINARY DISCHARGE PLAN: Attend aftercare/continuing care group Outpatient therapy Return to previous living arrangement  PATIENT/FAMILY INVOLVEMENT: This treatment plan has been presented to and reviewed with the patient, [name]  The patient and family have been given the opportunity to ask questions and make suggestions.  Margaretann Loveless, RN 12/30/2018, 12:53 AM

## 2018-12-30 NOTE — BHH Group Notes (Signed)
  Pt did not attend RN Psychoeducational group this morning.
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
## 2018-12-30 NOTE — BHH Group Notes (Signed)
LCSW Group Therapy Note  12/30/2018    11:15am-12:00pm  Type of Therapy and Topic:  Group Therapy: Early Messages Received About Anger  Participation Level:  None   Description of Group:   In this group, patients shared and discussed the early messages received in their lives about anger through parental or other adult modeling, teaching, repression, punishment, violence, and more.  Participants identified how those childhood lessons influence even now how they usually or often react when angered.  The group discussed that anger is a secondary emotion and what may be the underlying emotional themes that come out through anger outbursts or that are ignored through anger suppression.  Finally, as a group there was a conversation about the workbook's quote that "There is nothing wrong with anger; it is just a sign something needs to change."     Therapeutic Goals: 1. Patients will identify one or more childhood message about anger that they received and how it was taught to them. 2. Patients will discuss how these childhood experiences have influenced and continue to influence their own expression or repression of anger even today. 3. Patients will explore possible primary emotions that tend to fuel their secondary emotion of anger. 4. Patients will learn that anger itself is normal and cannot be eliminated, and that healthier coping skills can assist with resolving conflict rather than worsening situations.  Summary of Patient Progress:  The patient did not respond to questions and when asked if she preferred not to talk today, shook her head "no."  Therapeutic Modalities:   Cognitive Behavioral Therapy Motivation Interviewing  [name] [name]  .
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
## 2018-12-31 ENCOUNTER — Inpatient Hospital Stay (HOSPITAL_COMMUNITY): Payer: Medicare Other

## 2018-12-31 MED ORDER — HYDROXYZINE HCL 25 MG PO TABS
25.0000 mg | ORAL_TABLET | Freq: Four times a day (QID) | ORAL | Status: DC | PRN
  Administered 2018-12-31: 25 mg via ORAL
  Filled 2018-12-31: qty 1

## 2018-12-31 MED ORDER — QUETIAPINE FUMARATE 50 MG PO TABS
50.0000 mg | ORAL_TABLET | Freq: Three times a day (TID) | ORAL | Status: DC
  Administered 2018-12-31 – 2019-01-02 (×7): 50 mg via ORAL
  Filled 2018-12-31 (×12): qty 1

## 2018-12-31 NOTE — Progress Notes (Signed)
RN 1:1 Note  D- Initial 1:1 RN note written on hard copy progress note sheet. Pt has been sitting in the dayroom for the last hour watching television with sitter. No distress or discomfort noted.  A- 1:1 remains in effect for pt's safety.  R- Pt remains safe on the unit.

## 2018-12-31 NOTE — Plan of Care (Signed)
  Problem: Safety: Goal: Periods of time without injury will increase Outcome: Progressing   D: Pt alert and oriented on the unit. Pt sat in the dayroom with her 1:1 sitter and watched television. Pt is preoccupied and responding to [place] (a voice called [name]). Pt attended unit groups today and has been pleasant and cooperative. A: Education, support and encouragement provided, q15 minute safety checks remain in effect. Medications administered per MD orders. R: No reactions/side effects to medicine noted. Pt denies any concerns at this time. Pt ambulating on the unit with no issues and remains safe.

## 2018-12-31 NOTE — [facility] Suicide Risk Assessment (Signed)
[facility] INPATIENT:  Family/Significant Other Suicide Prevention Education  Suicide Prevention Education:  Education Completed; mother [name] [name] [phone],  (name of family member/significant other) has been identified by the patient as the family member/significant other with whom the patient will be residing, and identified as the person(s) who will aid the patient in the event of a mental health crisis (suicidal ideations/suicide attempt).  With written consent from the patient, the family member/significant other has been provided the following suicide prevention education, prior to the and/or following the discharge of the patient.  The suicide prevention education provided includes the following:  Suicide risk factors  Suicide prevention and interventions  National Suicide Hotline telephone number  [facility] assessment telephone number  [facility] Emergency Assistance [place] and/or Residential Mobile Crisis Unit telephone number  Request made of family/significant other to:  Remove weapons (e.g., guns, rifles, knives), all items previously/currently identified as safety concern.    Remove drugs/medications (over-the-counter, prescriptions, illicit drugs), all items previously/currently identified as a safety concern.  The family member/significant other verbalizes understanding of the suicide prevention education information provided.  The family member/significant other agrees to remove the items of safety concern listed above.  MOTHER STATED SHE HAS BEEN THROUGH THE ABOVE INFORMATION MANY TIMES AND DOES NOT NEED IT AGAIN.  SHE STATED SHE TRIES TO KEEP AN EYE ON PATIENT 24/7 BUT IT CAN BE DIFFICULT, ALTHOUGH SHE DOES THINK SHE TAKES HER MEDICINE.  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    SHE ALSO STATED PT HAS BEEN SEVERELY DEPRESSED BECAUSE HER DAUGHTER WILL HAVE  "NOTHING TO DO WITH HER."    SHE ALSO ASKED THAT SHE NOT HAVE A FEMALE SOCIAL WORKER IF POSSIBLE, BECAUSE SHE WOULD "COMPLETELY SHUT DOWN" AROUND MEN.  [name] [name] 12/31/2018, 3:39 PM

## 2018-12-31 NOTE — [facility] Group Notes (Signed)
[place] LCSW Group Therapy Note  Date/Time:  12/31/2018  11:00AM-12:00PM  Type of Therapy and Topic:  Group Therapy:  Music and Mood  Participation Level:  None   Description of Group: In this process group, members listened to a variety of genres of music and identified that different types of music evoke different responses.  Patients were encouraged to identify music that was soothing for them and music that was energizing for them.  Patients discussed how this knowledge can help with wellness and recovery in various ways including managing depression and anxiety as well as encouraging healthy sleep habits.    Therapeutic Goals: 1. Patients will explore the impact of different varieties of music on mood 2. Patients will verbalize the thoughts they have when listening to different types of music 3. Patients will identify music that is soothing to them as well as music that is energizing to them 4. Patients will discuss how to use this knowledge to assist in maintaining wellness and recovery 5. Patients will explore the use of music as a coping skill  Summary of Patient Progress:  At the beginning of group, patient would not answer questions. Although she sat through group she did not communicate at all and had no affect changes.  Therapeutic Modalities: Solution Focused Brief Therapy Activity   [place]mer Dominion, LCSW

## 2018-12-31 NOTE — Progress Notes (Signed)
Patient ID: [name] [name], female   DOB: [birth year], 48 y.o.   MRN: [id number] 1:1 notes  12/31/2018 @ 2200  D: Patient sitting in dayroom with sitter. Pt reports she had a "slow day" because the female peers were watching the playoff on the TV. Pt mood and affect appeared depressed and flat. Pt is aware why she is on 1:1 observation reports she does not want to hurt herself. Pt endorses AH without command. Pt denies HI/VH and pain.  No sign of distress noted at this time A: 1:1 observation for safety. Medication administered as prescribed. R: Patient remains safe with sitter. 1:1 continues
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
## 2018-12-31 NOTE — Progress Notes (Signed)
1:1 RN note  D: Pt sat in the dayroom with sitter and sat in her room during the early part of the evening. Pt shows no signs of distress or discomfort.  A: 1:1 observation continues for pt's safety. R: Pt remains safe on the unit.

## 2018-12-31 NOTE — Progress Notes (Signed)
Patient ID: [name] [name], female   DOB: 21-Oct-1971, 48 y.o.   MRN: [id number] D: Assumed care patient @ 2330. Patient in bed sleeping. Respiration regular and unlabored. No sign of distress noted at this time A: 15 mins checks for safety. R: Patient remains safe.

## 2018-12-31 NOTE — Progress Notes (Signed)
1:1 RN note  D- Pt is watching television in the dayroom with sitter. Pt shows no signs of distress or discomfort.  A- 1:1 observation continues for safety of pt. R- Pt remains safe on the unit.

## 2019-01-01 MED ORDER — QUETIAPINE FUMARATE 200 MG PO TABS
200.0000 mg | ORAL_TABLET | Freq: Every day | ORAL | Status: DC
  Administered 2019-01-01: 200 mg via ORAL
  Filled 2019-01-01 (×3): qty 1

## 2019-01-01 NOTE — Plan of Care (Signed)
  Problem: Education: Goal: Knowledge of [place] [facility] Education information/materials will improve Outcome: Progressing Goal: Emotional status will improve Outcome: Progressing Goal: Mental status will improve Outcome: Progressing Goal: Verbalization of understanding the information provided will improve Outcome: Progressing   Problem: Activity: Goal: Interest or engagement in activities will improve Outcome: Progressing Goal: Sleeping patterns will improve Outcome: Progressing   Problem: Coping: Goal: Ability to verbalize frustrations and anger appropriately will improve Outcome: Progressing

## 2019-01-01 NOTE — Progress Notes (Signed)
Patient ID: [name] [name], female   DOB: [birth year], 48 y.o.   MRN: [id number] 1:1 notes  01/01/2019 @ 0200  D: Patient in bed sleeping. Respiration regular and unlabored. No sign of distress noted at this time A: 1:1 observation for safety R: Patient remains asleep. Sitter at bedside. 1:1 continues

## 2019-01-01 NOTE — Progress Notes (Signed)
Recreation Therapy Notes  Date: 1.6.20 Time: 1000 Location: 500 Hall Dayroom  Group Topic: Coping Skills  Goal Area(s) Addresses:  Pt will be able to identify positive coping skills. Pt will be able to identify benefits of using positive coping skills. Pt will be able to identify benefits of using coping skills post d/c.  Behavioral Response: Engaged  Intervention: Worksheet  Activity: Coping Skills A to Z.  Patients worked together to Engineer, production for each letter of the alphabet.   Education: Radiographer, therapeutic, Dentist.   Education Outcome: Acknowledges understanding/In group clarification offered/Needs additional education.   Clinical Observations/Feedback: Pt was quite but engaged when prompted.  Pt identified some coping skills as yard sales, unity, exercise, prayer and gardening.    Victorino Sparrow, LRT/CTRS      Ria Comment, Matai Carpenito A 01/01/2019 11:30 AM

## 2019-01-01 NOTE — Progress Notes (Signed)
[facility] 1:1 Observation Documentation  For the first (8) hours following discontinuation of 1:1 precautions, a progress note entry by nursing staff should be documented at least every 2 hours, reflecting the patient's behavior, condition, mood, and conversation.  Use the progress notes for additional entries.  Time 1:1 discontinued:  0900   Patient's Behavior:  Patient has had no suicidal gestures, has maintained safety with staff support  Patient's Condition:  Patient remains free of injury.    Patient's Conversation:  Patient denies SI, HI and AVH.   [name] 01/01/2019, 8:59 AM
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
## 2019-01-01 NOTE — Progress Notes (Signed)
Patient ID: [name] [name], female   DOB: 10-Jan-1971, 48 y.o.   MRN: [id number] 1:1 notes  01/01/2019 @ 0600  D: Patient in bed sleeping. Respiration regular and unlabored. No sign of distress noted at this time A: 1:1 observation for safety R: Patient remains asleep. Sitter at bedside. 1:1 continues

## 2019-01-01 NOTE — Discharge Instructions (Signed)
Healthcare-Associated Pneumonia  Healthcare-associated pneumonia is a lung infection that a person can get when in a health care setting or during certain procedures. The infection causes air sacs inside the lungs to fill with pus or fluid. Healthcare-associated pneumonia is usually caused by bacteria that are common in health care settings. These bacteria may be resistant to some antibiotic medicines. What are the causes? This condition is caused by bacteria that get into your lungs. You can get this condition if you:  Breathe in droplets from an infected person's cough or sneeze.  Touch something that an infected person coughed or sneezed on and then touch your mouth, nose, or eyes.  Have a bacterial infection somewhere else in your body, if the bacteria spread to your lungs through your blood. What increases the risk? This condition is more likely to develop in people who:  Have a disease that weakens their body's defense system (immune system) or their ability to cough out germs.  Are older than age 53.  Having trouble swallowing.  Use a feeding or breathing tube.  Have a cold or the flu.  Have an IV tube inserted in a vein.  Have surgery.  Have a bed sore.  Live in a long-term care facility, such as a nursing home.  Were in the hospital for two or more days in the past 3 months.  Received hemodialysis in the past 30 days. What are the signs or symptoms? Symptoms of this condition include:  Fever.  Chills.  Cough.  Shortness of breath.  Wheezing or crackling sounds when breathing. How is this diagnosed? This condition may be diagnosed based on:  Your symptoms.  A chest X-ray.  A measurement of the amount of oxygen in your blood. How is this treated? This condition is treated with antibiotics. Your health care provider may take a sample of cells (culture) from your throat to determine what type of bacteria is in your lungs and change your antibiotic based  on the results. If you have bacteria in your blood, trouble breathing, or a low oxygen level, you may need to be treated at the hospital. At the hospital, you will be given antibiotics through an IV tube. You may also be given oxygen or breathing treatments. Follow these instructions at home: Medicine  Take your antibiotic medicine as told by your health care provider. Do not stop taking the antibiotic even if you start to feel better.  Take over-the-counter and other prescription medicines only as told by your health care provider. Activity  Rest at home until you feel better.  Return to your normal activities as told by your health care provider. Ask your health care provider what activities are safe for you. General instructions   Drink enough fluid to keep your urine clear or pale yellow.  Do not use any products that contain nicotine or tobacco, such as cigarettes and e-cigarettes. If you need help quitting, ask your health care provider.  Limit alcohol intake to no more than 1 drink per day for nonpregnant women and 2 drinks per day for men. One drink equals 12 oz of beer, 5 oz of wine, or 1 oz of hard liquor.  Keep all follow-up visits as told by your health care provider. This is important. How is this prevented? Actions that I can take To lower your risk of getting this condition again:  Do not smoke. This includes e-cigarettes.  Do not drink too much alcohol.  Keep your immune system healthy by eating  well and getting enough sleep. °· Get a flu shot every year (annually). °· Get a pneumonia vaccination if: °? You are older than age 65. °? You smoke. °? You have a long-lasting condition like lung disease. °· Exercise your lungs by taking deep breaths, walking, and using an incentive spirometer as directed. °· Wash your hands often with soap and water. If you cannot get to a sink to wash your hands, use an alcohol-based hand cleaner. °· Make sure your health care providers are  washing their hands. If you do not see them wash their hands, ask them to do so. °· When you are in a health care facility, avoid touching your eyes, nose, and mouth. °· Avoid touching any surface near where people have coughed or sneezed. °· Stand away from sick people when they are coughing or sneezing. °· Wear a mask if you cannot avoid exposure to people who are sick. °· Clean all surfaces often with a disinfectant cleaner, especially if someone is sick at home or work. ° °Precautions of my health care team °Hospitals, nursing homes, and other health care facilities take special care to try to prevent healthcare-associated pneumonia. To do this, your health care team may: °· Clean their hands with soap and water or with alcohol-based hand sanitizer before and after seeing patients. °· Wear gloves or masks during treatment. °· Sanitize medical instruments, tubes, other equipment, and surfaces in patient rooms. °· Raise (elevate) the head of your hospital bed so you are not lying flat. The head of the bed may be elevated 30 degrees or more. °· Have you sit up and move around as soon as possible after surgery. °· Only insert a breathing tube if needed. °· Do these things for you if you have a breathing tube: °? Clean the inside of your mouth regularly. °? Remove the breathing tube as soon as it is no longer needed. °Contact a health care provider if: °· Your symptoms do not get better or they get worse. °· Your symptoms come back after you have finished taking your antibiotics. °Get help right away if: °· You have trouble breathing. °· You have confusion or difficulty thinking. °This information is not intended to replace advice given to you by your health care provider. Make sure you discuss any questions you have with your health care provider. °Document Released: 05/04/2016 Document Revised: 09/28/2016 Document Reviewed: 09/10/2016 °Elsevier Interactive Patient Education © 2019 [employer] ° °

## 2019-01-01 NOTE — [facility] Group Notes (Signed)
Adult Psychoeducational Group Note  Date:  01/01/2019 Time:  4:26 PM  Group Topic/Focus:  Wellness Toolbox:   The focus of this group is to discuss various aspects of wellness, balancing those aspects and exploring ways to increase the ability to experience wellness.  Patients will create a wellness toolbox for use upon discharge.  Participation Level:  Minimal  Participation Quality:  Appropriate  Affect:  Appropriate  Cognitive:  Appropriate  Insight: Good  Engagement in Group:  Engaged  Modes of Intervention:  Education  Additional Comments:    Dub Mikes 01/01/2019, 4:26 PM

## 2019-01-02 MED ORDER — OXCARBAZEPINE 300 MG PO TABS: 300.0000 mg | ORAL_TABLET | Freq: Two times a day (BID) | ORAL | 0 refills | Status: DC

## 2019-01-02 MED ORDER — BUSPIRONE HCL 10 MG PO TABS: 10.0000 mg | ORAL_TABLET | Freq: Two times a day (BID) | ORAL | 0 refills | Status: DC

## 2019-01-02 MED ORDER — QUETIAPINE FUMARATE 200 MG PO TABS: 200.0000 mg | ORAL_TABLET | Freq: Every day | ORAL | 0 refills | Status: DC

## 2019-01-02 MED ORDER — ALBUTEROL SULFATE HFA 108 (90 BASE) MCG/ACT IN AERS: 2.0000 | INHALATION_SPRAY | RESPIRATORY_TRACT | Status: DC | PRN

## 2019-01-02 MED ORDER — QUETIAPINE FUMARATE 25 MG PO TABS: 50.0000 mg | ORAL_TABLET | Freq: Three times a day (TID) | ORAL | 0 refills | Status: DC

## 2019-01-02 MED ORDER — HYDROXYZINE HCL 25 MG PO TABS: 25.0000 mg | ORAL_TABLET | Freq: Four times a day (QID) | ORAL | 0 refills | Status: DC | PRN

## 2019-01-02 MED ORDER — CLONIDINE HCL 0.1 MG PO TABS: 0.1000 mg | ORAL_TABLET | Freq: Every day | ORAL | 0 refills | Status: DC

## 2019-01-02 NOTE — Progress Notes (Signed)
Patient ID: [name] [name], female   DOB: 1971-03-13, 48 y.o.   MRN: [id number] Patient discharged to home/self care on her own accord.  Patient denies SI, HI and AVH upon discharge and stated she was feeling much less depressed and anxious then when admitted.  Patient acknowledged all understanding of discharge plans and receipt of personal belongings.

## 2019-01-02 NOTE — Progress Notes (Signed)
Recreation Therapy Notes  Date: 1.7.20 Time: 1000 Location: 500 Hall Dayroom  Group Topic: Wellness  Goal Area(s) Addresses:  Patient will define components of whole wellness. Patient will verbalize benefit of whole wellness.  Behavioral Response: None  Intervention: Exercise  Activity: LRT led group in a series of stretches to loosen up the muscles.  Each patient then got the opportunity to lead the group in an exercise of their choice.  Patients could take breaks and get water as needed.  Education: Wellness, Dentist.   Education Outcome: Acknowledges education/In group clarification offered/Needs additional education.   Clinical Observations/Feedback: Pt sat and observed.  Pt did not participate.    Victorino Sparrow, LRT/CTRS     Victorino Sparrow A 01/02/2019 10:41 AM

## 2019-01-02 NOTE — Progress Notes (Signed)
Dar Note: Patient is in bed sleeping.  Respiration is regular and unlabored.  Routine safety checks maintained every 15 minutes.  No complaint offered or noted.  Patient is safe on the unit.
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
## 2019-01-02 NOTE — Progress Notes (Signed)
Patient ID: [name] [name], female   DOB: 1971-06-30, 48 y.o.   MRN: [id number] Per State regulations 482.30 this chart was reviewed for medical necessity with respect to the patient's admission/duration of stay.    Next review date:01/06/2019  Debarah Crape, BSN, RN-BC  Case Manager

## 2019-01-02 NOTE — Progress Notes (Signed)
  [facility] Adult Case Management Discharge Plan :  Will you be returning to the same living situation after discharge:  Yes,  home At discharge, do you have transportation home?: Yes,  [place] will transport patient back to her car at [facility] ED at 1pm. Do you have the ability to pay for your medications: Yes,  Medicare  Release of information consent forms completed and in the chart. [facility] security will transport patient to her car at [facility] ED at 1pm.  Patient to Follow up at: [place], [place]. Go on 01/09/2019.   Why:  Your hospital follow up appointment is Tuesday, 01/09/19 at 9:00a.  Please bring: photo ID, proof of insurance, and any discharge paperwork from this hospitalization. Contact information: [place] [address] [address]. Go on 01/04/2019.   Why:  Your next appointment with your therapist [name] is Thursday, 01/04/2019 at 2pm. Contact information: [place], [place] [address] Phone: [phone]          Next level of care provider has access to [place] and Suicide Prevention discussed: Yes,  with mother  Have you used any form of tobacco in the last 30 days? (Cigarettes, Smokeless Tobacco, Cigars, and/or Pipes): No  Has patient been referred to the Quitline?: N/A patient is not a smoker  Patient has been referred for addiction treatment: Yes  Joellen [place], [place] 01/02/2019, 9:11 AM

## 2019-01-02 NOTE — [facility] Suicide Risk Assessment (Signed)
[facility] Discharge Suicide Risk Assessment   Principal Problem: Exacerbation and underlying schizoaffective type condition Discharge Diagnoses: Active Problems:   Schizoaffective disorder, depressive type ([place])   Total Time spent with patient: 30 minutes  Patient appears to be at baseline she is alert fully oriented cooperative no thoughts of harming self or others no psychosis, can contract fully mood is stable and euthymic, she believes her previous and recent episodes of psychosis/dissociation related to insomnia and stress but she believes her medications are correct at the present time is seeking and eager for discharge Mental Status Per Nursing Assessment::   On Admission:  Suicidal ideation indicated by others  Demographic Factors:  Caucasian  Loss Factors: Decrease in vocational status  Historical Factors: Impulsivity  Risk Reduction Factors:   Religious beliefs about death  Continued Clinical Symptoms:  Personality Disorders:   Cluster B  Cognitive Features That Contribute To Risk:  None    Suicide Risk:  Minimal: No identifiable suicidal ideation.  Patients presenting with no risk factors but with morbid ruminations; may be classified as minimal risk based on the severity of the depressive symptoms  Follow-up [place], [place]. Go on 01/09/2019.   Why:  Your hospital follow up appointment is Tuesday, 01/09/19 at 9:00a.  Please bring: photo ID, proof of insurance, and any discharge paperwork from this hospitalization. Contact information: [place] [address] [place]. Go on 01/04/2019.   Why:  Your next appointment with your therapist [name] is Thursday, 01/04/2019 at 2pm. Contact information: [place], [place] [address] Phone: [phone]          Plan Of Care/Follow-up recommendations:  Activity:  full  Rashika Bettes, MD 01/02/2019, 9:37 AM

## 2019-01-12 ENCOUNTER — Encounter (HOSPITAL_COMMUNITY): Payer: Self-pay

## 2019-01-12 ENCOUNTER — Other Ambulatory Visit: Payer: Self-pay

## 2019-01-12 ENCOUNTER — Emergency Department (HOSPITAL_COMMUNITY)
Admission: EM | Admit: 2019-01-12 | Discharge: 2019-01-15 | Disposition: A | Discharge status: Home / Self Care | Payer: Medicare Other | Attending: Emergency Medicine | Admitting: Emergency Medicine

## 2019-01-12 DIAGNOSIS — S31139A Puncture wound of abdominal wall without foreign body, unspecified quadrant without penetration into peritoneal cavity, initial encounter: Secondary | ICD-10-CM | POA: Insufficient documentation

## 2019-01-12 DIAGNOSIS — Z23 Encounter for immunization: Secondary | ICD-10-CM | POA: Diagnosis not present

## 2019-01-12 DIAGNOSIS — Z79899 Other long term (current) drug therapy: Secondary | ICD-10-CM | POA: Insufficient documentation

## 2019-01-12 DIAGNOSIS — Y9389 Activity, other specified: Secondary | ICD-10-CM | POA: Diagnosis not present

## 2019-01-12 DIAGNOSIS — F603 Borderline personality disorder: Secondary | ICD-10-CM | POA: Diagnosis present

## 2019-01-12 DIAGNOSIS — Z9884 Bariatric surgery status: Secondary | ICD-10-CM | POA: Insufficient documentation

## 2019-01-12 DIAGNOSIS — R440 Auditory hallucinations: Secondary | ICD-10-CM

## 2019-01-12 DIAGNOSIS — X781XXA Intentional self-harm by knife, initial encounter: Secondary | ICD-10-CM | POA: Diagnosis not present

## 2019-01-12 DIAGNOSIS — F250 Schizoaffective disorder, bipolar type: Secondary | ICD-10-CM | POA: Diagnosis not present

## 2019-01-12 DIAGNOSIS — F251 Schizoaffective disorder, depressive type: Secondary | ICD-10-CM | POA: Insufficient documentation

## 2019-01-12 DIAGNOSIS — Z85828 Personal history of other malignant neoplasm of skin: Secondary | ICD-10-CM | POA: Diagnosis not present

## 2019-01-12 DIAGNOSIS — F419 Anxiety disorder, unspecified: Secondary | ICD-10-CM | POA: Insufficient documentation

## 2019-01-12 DIAGNOSIS — Y92810 Car as the place of occurrence of the external cause: Secondary | ICD-10-CM | POA: Insufficient documentation

## 2019-01-12 DIAGNOSIS — F17200 Nicotine dependence, unspecified, uncomplicated: Secondary | ICD-10-CM | POA: Diagnosis not present

## 2019-01-12 DIAGNOSIS — Y998 Other external cause status: Secondary | ICD-10-CM | POA: Insufficient documentation

## 2019-01-12 DIAGNOSIS — I10 Essential (primary) hypertension: Secondary | ICD-10-CM | POA: Diagnosis not present

## 2019-01-12 DIAGNOSIS — R45851 Suicidal ideations: Secondary | ICD-10-CM | POA: Diagnosis not present

## 2019-01-12 LAB — CBC
HCT: 39 % (ref 36.0–46.0)
Hemoglobin: 11.9 g/dL — ABNORMAL LOW (ref 12.0–15.0)
MCH: 27.2 pg (ref 26.0–34.0)
MCHC: 30.5 g/dL (ref 30.0–36.0)
MCV: 89.2 fL (ref 80.0–100.0)
PLATELETS: 224 10*3/uL (ref 150–400)
RBC: 4.37 MIL/uL (ref 3.87–5.11)
RDW: 14.1 % (ref 11.5–15.5)
WBC: 7.5 10*3/uL (ref 4.0–10.5)
nRBC: 0 % (ref 0.0–0.2)

## 2019-01-12 LAB — COMPREHENSIVE METABOLIC PANEL
ALT: 14 U/L (ref 0–44)
AST: 13 U/L — ABNORMAL LOW (ref 15–41)
Albumin: 4.5 g/dL (ref 3.5–5.0)
Alkaline Phosphatase: 106 U/L (ref 38–126)
Anion gap: 9 (ref 5–15)
BUN: 18 mg/dL (ref 6–20)
CO2: 26 mmol/L (ref 22–32)
Calcium: 9.2 mg/dL (ref 8.9–10.3)
Chloride: 104 mmol/L (ref 98–111)
Creatinine, Ser: 0.96 mg/dL (ref 0.44–1.00)
GFR calc Af Amer: >60 mL/min (ref >60)
GFR calc non Af Amer: >60 mL/min (ref >60)
Glucose, Bld: 115 mg/dL — ABNORMAL HIGH (ref 70–99)
Potassium: 4.3 mmol/L (ref 3.5–5.1)
SODIUM: 139 mmol/L (ref 135–145)
Total Bilirubin: 0.7 mg/dL (ref 0.3–1.2)
Total Protein: 7.3 g/dL (ref 6.5–8.1)

## 2019-01-12 LAB — SALICYLATE LEVEL

## 2019-01-12 LAB — ACETAMINOPHEN LEVEL: Acetaminophen (Tylenol), Serum: <10 ug/mL — ABNORMAL LOW (ref 10–30)

## 2019-01-12 LAB — ETHANOL: Alcohol, Ethyl (B): <10 mg/dL (ref <10)

## 2019-01-12 LAB — RAPID URINE DRUG SCREEN, HOSP PERFORMED
Amphetamines: NOT DETECTED
Barbiturates: NOT DETECTED
Benzodiazepines: POSITIVE — AB
Cocaine: NOT DETECTED
OPIATES: NOT DETECTED
Tetrahydrocannabinol: NOT DETECTED

## 2019-01-12 LAB — PREGNANCY, URINE: PREG TEST UR: NEGATIVE

## 2019-01-12 MED ORDER — LORAZEPAM 2 MG/ML IJ SOLN: 0.0000 mg | Freq: Two times a day (BID) | INTRAMUSCULAR | Status: DC

## 2019-01-12 MED ORDER — IBUPROFEN 200 MG PO TABS: 600.0000 mg | ORAL_TABLET | Freq: Three times a day (TID) | ORAL | Status: DC | PRN

## 2019-01-12 MED ORDER — VITAMIN B-1 100 MG PO TABS
100.0000 mg | ORAL_TABLET | Freq: Every day | ORAL | Status: DC
  Administered 2019-01-12 – 2019-01-15 (×4): 100 mg via ORAL
  Filled 2019-01-12 (×4): qty 1

## 2019-01-12 MED ORDER — THIAMINE HCL 100 MG/ML IJ SOLN: 100.0000 mg | Freq: Every day | INTRAMUSCULAR | Status: DC

## 2019-01-12 MED ORDER — LORAZEPAM 1 MG PO TABS: 0.0000 mg | ORAL_TABLET | Freq: Four times a day (QID) | ORAL | Status: DC

## 2019-01-12 MED ORDER — LORAZEPAM 2 MG/ML IJ SOLN: 0.0000 mg | Freq: Four times a day (QID) | INTRAMUSCULAR | Status: DC

## 2019-01-12 MED ORDER — LORAZEPAM 1 MG PO TABS: 0.0000 mg | ORAL_TABLET | Freq: Two times a day (BID) | ORAL | Status: DC
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
## 2019-01-12 NOTE — ED Notes (Signed)
Bed: [facility] Expected date:  Expected time:  Means of arrival:  Comments: Hold for triage 1

## 2019-01-12 NOTE — ED Notes (Signed)
Pt does not respond to this Probation officer. She just stares when spoken to. Did not eat dinner.
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
## 2019-01-12 NOTE — ED Notes (Signed)
Pt is quiet and cooperative.  Pt is guarded and suspicious.  Pt sts she hears voices sometimes and hears them now.  Pt stares without speaking at times.  Pt did not want her B1 PO medication but eventually took it.  Pt EKG completed.  Pt is in room, in bed sleeping.

## 2019-01-12 NOTE — Progress Notes (Signed)
Patient ID: [name] [name], female   DOB: 1971/11/21, 48 y.o.   MRN: [id number]  EDP in to see Pt. Pt stated to EDP she took a double dose of her daily medications. Pt is ambulating around the unit very slowly and appears to be responding to internal stimuli and keeps going to the locked doors to see if they will open. Pt will be observed overnight for safety in the [facility] and re-assessed in the morning. Pt's home medications will be restarted tomorrow.   Ethelene Hal, FNP-C 01/12/2019      [id number]

## 2019-01-12 NOTE — BH Assessment (Signed)
[place] Assessment Progress Note  Case was staffed with Romilda Garret FNP who recommended patient be observed and monitored for safety.

## 2019-01-12 NOTE — ED Notes (Signed)
Bed: [facility] Expected date:  Expected time:  Means of arrival:  Comments: 

## 2019-01-12 NOTE — ED Notes (Signed)
Pt came to the Acute Unit and went to bed. She has been cooperative.

## 2019-01-13 DIAGNOSIS — F250 Schizoaffective disorder, bipolar type: Secondary | ICD-10-CM

## 2019-01-13 MED ORDER — QUETIAPINE FUMARATE 100 MG PO TABS
100.0000 mg | ORAL_TABLET | Freq: Every day | ORAL | Status: DC
  Administered 2019-01-13: 100 mg via ORAL
  Filled 2019-01-13: qty 1

## 2019-01-13 MED ORDER — BUSPIRONE HCL 10 MG PO TABS
10.0000 mg | ORAL_TABLET | Freq: Two times a day (BID) | ORAL | Status: DC
  Administered 2019-01-14 – 2019-01-15 (×3): 10 mg via ORAL
  Filled 2019-01-13 (×4): qty 1

## 2019-01-13 MED ORDER — LORAZEPAM 2 MG/ML IJ SOLN
1.0000 mg | Freq: Once | INTRAMUSCULAR | Status: AC | PRN
  Administered 2019-01-13: 1 mg via INTRAMUSCULAR
  Filled 2019-01-13: qty 1

## 2019-01-13 MED ORDER — OXCARBAZEPINE 300 MG PO TABS
300.0000 mg | ORAL_TABLET | Freq: Two times a day (BID) | ORAL | Status: DC
  Administered 2019-01-14 – 2019-01-15 (×3): 300 mg via ORAL
  Filled 2019-01-13 (×4): qty 1

## 2019-01-13 NOTE — ED Notes (Signed)
Patient would not take po medications.  Held pill cup in her hand, then handed it back to nurse like she didn't know what it was for.  Margarita Grizzle, NP, notified.

## 2019-01-13 NOTE — ED Notes (Signed)
Earlier patient was lying in her bed butting her head against the headboard.  Nurse placed pillow behind head to avoid injury.
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
## 2019-01-13 NOTE — ED Notes (Signed)
Patient lying in bed when nurse came in for assessment.  When asked how she was doing patient did not respond.  Earlier patient was walking slowly throughout the unit like in a daze.  She did mention to the tech that she was hearing voices, but tech reported she said very little and acted withdrawn.

## 2019-01-13 NOTE — ED Notes (Signed)
Pt awake, alert & responsive, no distress noted, calm at present.  Monitoring for safety, Q 15 min checks in effect.

## 2019-01-13 NOTE — Progress Notes (Signed)
At the direction of the psychiatric team, the pt was referred out to the following facilities:  [facility]  CSW will continue to follow for D/C needs.  Alphonse Guild. Cahterine Heinzel, LCSW, LCAS, CSI Clinical Social Worker Ph: [phone]

## 2019-01-13 NOTE — ED Notes (Signed)
Nurse rounded finding patient standing on bed and making comments about diving into a pool.  Redirected patient to a lying position telling her that she is not at a pool and showing her the hard floor.  Patient did lie down after being redirected.

## 2019-01-13 NOTE — ED Notes (Signed)
Patient found sitting on ledge in room again above her bed.  Charge nurse suggested to remove bed from room and place a mattress on the floor.

## 2019-01-14 MED ORDER — HYDROXYZINE HCL 10 MG PO TABS
10.0000 mg | ORAL_TABLET | Freq: Two times a day (BID) | ORAL | Status: DC
  Administered 2019-01-14 – 2019-01-15 (×3): 10 mg via ORAL
  Filled 2019-01-14 (×3): qty 1

## 2019-01-14 MED ORDER — QUETIAPINE FUMARATE ER 300 MG PO TB24
300.0000 mg | ORAL_TABLET | Freq: Every day | ORAL | Status: DC
  Administered 2019-01-14 – 2019-01-15 (×2): 300 mg via ORAL
  Filled 2019-01-14 (×2): qty 1

## 2019-01-14 NOTE — ED Notes (Signed)
Pt sleeping at present, no distress noted, calm & cooperative at present.  Awake, alert & responsive,  Monitoring for safety, Q 15 min checks in effect.

## 2019-01-14 NOTE — ED Notes (Signed)
Pt compliant with medication regimen with much encouragement. Pt responding to internal stimuli, referencing "[name]" Pt appears anxious, noted rocking in bed and picking at linen. This nurse notified psych providers of pt behavior. Encouragement and support provided. Special checks q 15 mins in place for safety, Video monitoring in place. Will continue to monitor.

## 2019-01-14 NOTE — [facility] Counselor (Signed)
Pt reassessed.  She appeared to be responding to internal stimuli and she was rocking back and forth when author entered.  Pt spoke very softly.  She reported that she came to the hospital from [facility] because she saw her daughter, and also that [place]'' (apparently the name of a voice within) urged her to kill herself by jumping off a bridge.  Pt stated that she takes medication but that it does not work.  Recommend continued inpatient placement.

## 2019-01-14 NOTE — ED Notes (Signed)
Pt taking a shower 

## 2019-01-15 ENCOUNTER — Emergency Department (HOSPITAL_COMMUNITY): Payer: Medicare Other

## 2019-01-15 ENCOUNTER — Encounter (HOSPITAL_COMMUNITY): Payer: Self-pay | Admitting: Emergency Medicine

## 2019-01-15 ENCOUNTER — Emergency Department (HOSPITAL_COMMUNITY)
Admission: EM | Admit: 2019-01-15 | Discharge: 2019-01-16 | Disposition: A | Discharge status: Other Acute Inpatient Hospital | Payer: Medicare Other | Source: Home / Self Care | Attending: Emergency Medicine | Admitting: Emergency Medicine

## 2019-01-15 DIAGNOSIS — F251 Schizoaffective disorder, depressive type: Secondary | ICD-10-CM | POA: Insufficient documentation

## 2019-01-15 DIAGNOSIS — S31139A Puncture wound of abdominal wall without foreign body, unspecified quadrant without penetration into peritoneal cavity, initial encounter: Secondary | ICD-10-CM

## 2019-01-15 DIAGNOSIS — R440 Auditory hallucinations: Secondary | ICD-10-CM

## 2019-01-15 DIAGNOSIS — R45851 Suicidal ideations: Secondary | ICD-10-CM | POA: Insufficient documentation

## 2019-01-15 DIAGNOSIS — X781XXA Intentional self-harm by knife, initial encounter: Secondary | ICD-10-CM

## 2019-01-15 DIAGNOSIS — Z23 Encounter for immunization: Secondary | ICD-10-CM

## 2019-01-15 DIAGNOSIS — Y9389 Activity, other specified: Secondary | ICD-10-CM | POA: Insufficient documentation

## 2019-01-15 DIAGNOSIS — Y998 Other external cause status: Secondary | ICD-10-CM | POA: Insufficient documentation

## 2019-01-15 DIAGNOSIS — F17200 Nicotine dependence, unspecified, uncomplicated: Secondary | ICD-10-CM

## 2019-01-15 DIAGNOSIS — Y92810 Car as the place of occurrence of the external cause: Secondary | ICD-10-CM | POA: Insufficient documentation

## 2019-01-15 DIAGNOSIS — F250 Schizoaffective disorder, bipolar type: Secondary | ICD-10-CM | POA: Diagnosis not present

## 2019-01-15 HISTORY — DX: Schizophrenia, unspecified: F20.9

## 2019-01-15 LAB — I-STAT CHEM 8, ED
BUN: 15 mg/dL (ref 6–20)
Calcium, Ion: 1.21 mmol/L (ref 1.15–1.40)
Chloride: 106 mmol/L (ref 98–111)
Creatinine, Ser: 0.9 mg/dL (ref 0.44–1.00)
Glucose, Bld: 113 mg/dL — ABNORMAL HIGH (ref 70–99)
HCT: 38 % (ref 36.0–46.0)
Hemoglobin: 12.9 g/dL (ref 12.0–15.0)
Potassium: 4.1 mmol/L (ref 3.5–5.1)
SODIUM: 139 mmol/L (ref 135–145)
TCO2: 27 mmol/L (ref 22–32)

## 2019-01-15 LAB — URINALYSIS, ROUTINE W REFLEX MICROSCOPIC
Bilirubin Urine: NEGATIVE
Glucose, UA: NEGATIVE mg/dL
Ketones, ur: NEGATIVE mg/dL
Leukocytes, UA: NEGATIVE
NITRITE: NEGATIVE
Protein, ur: NEGATIVE mg/dL
Specific Gravity, Urine: 1.019 (ref 1.005–1.030)
pH: 6 (ref 5.0–8.0)

## 2019-01-15 LAB — PREPARE FRESH FROZEN PLASMA
Unit division: 0
Unit division: 0

## 2019-01-15 LAB — CBC WITH DIFFERENTIAL/PLATELET
Abs Immature Granulocytes: 0.03 10*3/uL (ref 0.00–0.07)
Basophils Absolute: 0.1 10*3/uL (ref 0.0–0.1)
Basophils Relative: 1 %
EOS PCT: 0 %
Eosinophils Absolute: 0 10*3/uL (ref 0.0–0.5)
HCT: 39.8 % (ref 36.0–46.0)
Hemoglobin: 12.2 g/dL (ref 12.0–15.0)
Immature Granulocytes: 0 %
Lymphocytes Relative: 11 %
Lymphs Abs: 0.9 10*3/uL (ref 0.7–4.0)
MCH: 26.6 pg (ref 26.0–34.0)
MCHC: 30.7 g/dL (ref 30.0–36.0)
MCV: 86.9 fL (ref 80.0–100.0)
Monocytes Absolute: 0.5 10*3/uL (ref 0.1–1.0)
Monocytes Relative: 7 %
Neutro Abs: 6.2 10*3/uL (ref 1.7–7.7)
Neutrophils Relative %: 81 %
Platelets: 250 10*3/uL (ref 150–400)
RBC: 4.58 MIL/uL (ref 3.87–5.11)
RDW: 13.8 % (ref 11.5–15.5)
WBC: 7.7 10*3/uL (ref 4.0–10.5)
nRBC: 0 % (ref 0.0–0.2)

## 2019-01-15 LAB — TYPE AND SCREEN
ABO/RH(D): O NEG
ANTIBODY SCREEN: NEGATIVE
UNIT DIVISION: 0
Unit division: 0

## 2019-01-15 LAB — I-STAT BETA HCG BLOOD, ED (MC, WL, AP ONLY): I-stat hCG, quantitative: <5 m[IU]/mL (ref <5)

## 2019-01-15 LAB — COMPREHENSIVE METABOLIC PANEL
ALT: 9 U/L (ref 0–44)
AST: 12 U/L — ABNORMAL LOW (ref 15–41)
Albumin: 4.1 g/dL (ref 3.5–5.0)
Alkaline Phosphatase: 104 U/L (ref 38–126)
Anion gap: 11 (ref 5–15)
BUN: 12 mg/dL (ref 6–20)
CO2: 23 mmol/L (ref 22–32)
Calcium: 9.4 mg/dL (ref 8.9–10.3)
Chloride: 105 mmol/L (ref 98–111)
Creatinine, Ser: 0.93 mg/dL (ref 0.44–1.00)
GFR calc non Af Amer: >60 mL/min (ref >60)
Glucose, Bld: 115 mg/dL — ABNORMAL HIGH (ref 70–99)
Potassium: 4.1 mmol/L (ref 3.5–5.1)
Sodium: 139 mmol/L (ref 135–145)
Total Bilirubin: 0.3 mg/dL (ref 0.3–1.2)
Total Protein: 7 g/dL (ref 6.5–8.1)

## 2019-01-15 LAB — BPAM FFP
Blood Product Expiration Date: 202001252359
Blood Product Expiration Date: 202001252359
ISSUE DATE / TIME: 202001201605
ISSUE DATE / TIME: 202001201605
Unit Type and Rh: 6200
Unit Type and Rh: 6200

## 2019-01-15 LAB — BPAM RBC
Blood Product Expiration Date: 202001312359
Blood Product Expiration Date: 202001312359
ISSUE DATE / TIME: 202001201605
ISSUE DATE / TIME: 202001201605
Unit Type and Rh: 9500
Unit Type and Rh: 9500

## 2019-01-15 LAB — ABO/RH: ABO/RH(D): O NEG

## 2019-01-15 LAB — SALICYLATE LEVEL

## 2019-01-15 LAB — ACETAMINOPHEN LEVEL
Acetaminophen (Tylenol), Serum: <10 ug/mL — ABNORMAL LOW (ref 10–30)
Acetaminophen (Tylenol), Serum: <10 ug/mL — ABNORMAL LOW (ref 10–30)

## 2019-01-15 LAB — ETHANOL: Alcohol, Ethyl (B): <10 mg/dL (ref <10)

## 2019-01-15 MED ORDER — HYDROXYZINE HCL 10 MG PO TABS: 10.0000 mg | ORAL_TABLET | Freq: Two times a day (BID) | ORAL | 0 refills | Status: DC

## 2019-01-15 MED ORDER — TETANUS-DIPHTH-ACELL PERTUSSIS 5-2.5-18.5 LF-MCG/0.5 IM SUSP
0.5000 mL | Freq: Once | INTRAMUSCULAR | Status: AC
  Administered 2019-01-15: 0.5 mL via INTRAMUSCULAR
  Filled 2019-01-15: qty 0.5

## 2019-01-15 MED ORDER — IOHEXOL 300 MG/ML  SOLN
100.0000 mL | Freq: Once | INTRAMUSCULAR | Status: AC | PRN
  Administered 2019-01-15: 100 mL via INTRAVENOUS
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
## 2019-01-15 NOTE — BH Assessment (Signed)
[facility] Assessment Progress Note  Per Buford Dresser, DO, this pt does not require psychiatric hospitalization at this time.  Pt is to be discharged from [facility] with recommendation to follow up with [facility] in [place], pt's community of residence.  This has been included in pt's discharge instructions.  Pt's nurse, Diane, has been notified.  Jalene Mullet, [place] Triage Specialist [phone]

## 2019-01-15 NOTE — Discharge Instructions (Signed)
For your behavioral health needs, you are advised to continue treatment with [facility]:   [facility]  [address] [place], [place] [address]  [phone]

## 2019-01-15 NOTE — ED Notes (Signed)
This Probation officer informed pt that she is discharged and asked her if she needs to call her mother to pick her up. Pt replied, "No, I have my own car here."
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
## 2019-01-15 NOTE — ED Notes (Signed)
Spoke with [name] [name]. Pt is cleared from their standpoint. EDP aware.
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
## 2019-01-15 NOTE — ED Notes (Signed)
Pt moved to Trauma C to complete EKG. Pt returned to H21 after EKG with sitter at side.

## 2019-01-15 NOTE — BHH Counselor (Signed)
Per Pruett, RN, notes patient is gone for a CT scan. TTS asked the nurse to call when patient is back and available to be seen.

## 2019-01-15 NOTE — ED Notes (Signed)
Pt discharged home. Discharged instructions read to pt who verbalized understanding. All belongings returned to pt who signed for same. Denies SI/HI, is not delusional and not responding to internal stimuli. Escorted pt to the ED exit.    

## 2019-01-15 NOTE — ED Notes (Signed)
Patient transported to CT 
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
## 2019-01-15 NOTE — ED Notes (Signed)
Poison control recommends repeating EKG in 6 hours, acetominophen 4hours post ingestion, asa level now,  benzos for agitation  Observe EKG for QT elongation  Observe for 8 hours
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
## 2019-01-16 ENCOUNTER — Encounter (HOSPITAL_COMMUNITY): Payer: Self-pay | Admitting: Registered Nurse

## 2019-01-16 ENCOUNTER — Other Ambulatory Visit: Payer: Self-pay

## 2019-01-16 LAB — BLOOD PRODUCT ORDER (VERBAL) VERIFICATION

## 2019-01-16 MED ORDER — LORAZEPAM 2 MG/ML IJ SOLN
2.0000 mg | Freq: Once | INTRAMUSCULAR | Status: AC
  Administered 2019-01-16: 2 mg via INTRAMUSCULAR
  Filled 2019-01-16: qty 1

## 2019-01-16 MED ORDER — PRAZOSIN HCL 1 MG PO CAPS
1.0000 mg | ORAL_CAPSULE | Freq: Every day | ORAL | Status: DC
  Filled 2019-01-16: qty 1

## 2019-01-16 MED ORDER — OXCARBAZEPINE 300 MG PO TABS
300.0000 mg | ORAL_TABLET | Freq: Two times a day (BID) | ORAL | Status: DC
  Administered 2019-01-16: 300 mg via ORAL
  Filled 2019-01-16: qty 1

## 2019-01-16 MED ORDER — BUSPIRONE HCL 10 MG PO TABS
10.0000 mg | ORAL_TABLET | Freq: Two times a day (BID) | ORAL | Status: DC
  Administered 2019-01-16: 10 mg via ORAL
  Filled 2019-01-16: qty 1

## 2019-01-16 MED ORDER — QUETIAPINE FUMARATE 25 MG PO TABS
25.0000 mg | ORAL_TABLET | Freq: Three times a day (TID) | ORAL | Status: DC
  Administered 2019-01-16 (×2): 25 mg via ORAL
  Filled 2019-01-16 (×2): qty 1

## 2019-01-16 NOTE — ED Notes (Signed)
Pt ambulatory with sitter to bathroom

## 2019-01-16 NOTE — Progress Notes (Signed)
Pt is accepted to [facility]. Jonelle Sports is the accepting/attending provider.   Call report to [phone] [facility] ED notified.  Pt is involuntary and can be transported by law enforcement Pt may arrive at [facility] as soon as transportation is arranged.   Audree Camel, LCSW, [place] Disposition [facility]/TTS [phone]

## 2019-01-16 NOTE — ED Provider Notes (Signed)
Patient is a 48 year old female presenting with self injury with a knife. Patient resting comfortably in bed. TTS recommendations are pending for disposition.   Arville Lime, [place] 01/16/19 [place], [place], DO 01/16/19 1409

## 2019-01-16 NOTE — ED Notes (Signed)
Patient noted by sitter to be trying to choke herself with the cords behind the tv in the room, because "[place] was telling me to choke myself" per patient. Patient stopped by sitter, deescalated by staff, and PA [facility] aware. 2 mg IV ativan ordered and given.

## 2019-01-16 NOTE — Progress Notes (Signed)
Pt. meets criteria for inpatient treatment per Earleen Newport, NP.  Referred out to the following hospitals: [facility]  [facility]  [facility]  [facility]     Disposition CSW will continue to follow for placement.  ,j
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
## 2019-01-16 NOTE — ED Notes (Signed)
Mitzi Hansen Paschal-Sheriff called @ [phone] for Transfer to [facility], RN-called by Levada Dy

## 2019-01-16 NOTE — ED Provider Notes (Signed)
Patient is tearful and anxious throughout exam. Patient reports she auditory hallucinations and states [place] is telling her to harm herself. Per nurse, patient tried to choke herself with phone cord. Patient has redness around neck, but is breathing without difficulty and speaking in full sentences. Patient is redirectable, but highly anxious. Will order Ativan for symptoms.    Arville Lime, [place] 01/16/19 1400    Quintella Reichert, MD 01/16/19 [phone]

## 2019-01-17 ENCOUNTER — Encounter (HOSPITAL_COMMUNITY): Payer: Self-pay

## 2019-02-09 ENCOUNTER — Emergency Department (HOSPITAL_COMMUNITY): Payer: Medicare Other

## 2019-02-09 ENCOUNTER — Other Ambulatory Visit: Payer: Self-pay

## 2019-02-09 ENCOUNTER — Emergency Department (HOSPITAL_COMMUNITY)
Admission: EM | Admit: 2019-02-09 | Discharge: 2019-02-09 | Disposition: A | Discharge status: Home / Self Care | Payer: Medicare Other | Attending: Emergency Medicine | Admitting: Emergency Medicine

## 2019-02-09 DIAGNOSIS — I10 Essential (primary) hypertension: Secondary | ICD-10-CM | POA: Insufficient documentation

## 2019-02-09 DIAGNOSIS — F17210 Nicotine dependence, cigarettes, uncomplicated: Secondary | ICD-10-CM | POA: Insufficient documentation

## 2019-02-09 DIAGNOSIS — R079 Chest pain, unspecified: Secondary | ICD-10-CM | POA: Diagnosis present

## 2019-02-09 DIAGNOSIS — Z79899 Other long term (current) drug therapy: Secondary | ICD-10-CM | POA: Insufficient documentation

## 2019-02-09 DIAGNOSIS — R072 Precordial pain: Secondary | ICD-10-CM | POA: Diagnosis not present

## 2019-02-09 LAB — BASIC METABOLIC PANEL
Anion gap: 11 (ref 5–15)
BUN: 8 mg/dL (ref 6–20)
CO2: 21 mmol/L — ABNORMAL LOW (ref 22–32)
Calcium: 9 mg/dL (ref 8.9–10.3)
Chloride: 107 mmol/L (ref 98–111)
Creatinine, Ser: 0.79 mg/dL (ref 0.44–1.00)
GFR calc Af Amer: >60 mL/min (ref >60)
Glucose, Bld: 111 mg/dL — ABNORMAL HIGH (ref 70–99)
Potassium: 4.3 mmol/L (ref 3.5–5.1)
Sodium: 139 mmol/L (ref 135–145)

## 2019-02-09 LAB — CBC WITH DIFFERENTIAL/PLATELET
Abs Immature Granulocytes: 0.01 10*3/uL (ref 0.00–0.07)
Basophils Absolute: 0.1 10*3/uL (ref 0.0–0.1)
Basophils Relative: 1 %
Eosinophils Absolute: 0 10*3/uL (ref 0.0–0.5)
Eosinophils Relative: 1 %
HCT: 39.4 % (ref 36.0–46.0)
Hemoglobin: 11.8 g/dL — ABNORMAL LOW (ref 12.0–15.0)
Immature Granulocytes: 0 %
Lymphocytes Relative: 20 %
Lymphs Abs: 1.5 10*3/uL (ref 0.7–4.0)
MCH: 25.7 pg — ABNORMAL LOW (ref 26.0–34.0)
MCHC: 29.9 g/dL — ABNORMAL LOW (ref 30.0–36.0)
MCV: 85.7 fL (ref 80.0–100.0)
Monocytes Absolute: 0.7 10*3/uL (ref 0.1–1.0)
Monocytes Relative: 10 %
NEUTROS PCT: 68 %
Neutro Abs: 5 10*3/uL (ref 1.7–7.7)
Platelets: 279 10*3/uL (ref 150–400)
RBC: 4.6 MIL/uL (ref 3.87–5.11)
RDW: 14.1 % (ref 11.5–15.5)
WBC: 7.2 10*3/uL (ref 4.0–10.5)
nRBC: 0 % (ref 0.0–0.2)

## 2019-02-09 LAB — CBG MONITORING, ED: Glucose-Capillary: 101 mg/dL — ABNORMAL HIGH (ref 70–99)

## 2019-02-09 LAB — I-STAT BETA HCG BLOOD, ED (MC, WL, AP ONLY): I-stat hCG, quantitative: <5 m[IU]/mL (ref <5)

## 2019-02-09 LAB — TROPONIN I: Troponin I: <0.03 ng/mL (ref <0.03)

## 2019-02-09 LAB — D-DIMER, QUANTITATIVE: D-Dimer, Quant: 0.4 ug/mL-FEU (ref 0.00–0.50)

## 2019-02-09 MED ORDER — SODIUM CHLORIDE 0.9 % IV BOLUS: 1000.0000 mL | Freq: Once | INTRAVENOUS | Status: DC

## 2019-02-09 MED ORDER — ALUM & MAG HYDROXIDE-SIMETH 200-200-20 MG/5ML PO SUSP
30.0000 mL | Freq: Once | ORAL | Status: AC
  Administered 2019-02-09: 30 mL via ORAL
  Filled 2019-02-09: qty 30

## 2019-02-09 NOTE — ED Notes (Signed)
Patient transported to X-ray 

## 2019-02-09 NOTE — ED Provider Notes (Signed)
MSE was initiated and I personally evaluated the patient and placed orders (if any) at  4:36 PM on February 09, 2019.  I was asked to see patient because she passed out in the waiting room.  The patient states she is been having chest pain, shortness of breath and dizziness along with headache all day today.  Woke her up from sleep.  She is very fidgety but otherwise in no acute distress.  She is noted to be tachycardic.  She does not appear to have a severe head injury and at this point I will place screening lab work, ECG and chest x-ray.  The patient appears stable so that the remainder of the MSE may be completed by another provider.   Sherwood Gambler, MD 02/09/19 [phone]

## 2019-02-09 NOTE — ED Triage Notes (Signed)
Patient reportedly fell while in the lobby waiting to be triaged. Staff attended to patient immediately, no period of LOC, but patient slow to answer questions at first. When asked why she presents to ED, she states she had CP onset at 1400 today. Unable to provide further details about pain or symptoms. Resp e/u. A&O x 4. No obvious injuries noted.

## 2019-02-09 NOTE — Patient Outreach (Signed)
[place]) Care Management  02/09/2019  [name] [name] [birth year] [id number]   Referral Date: 02/09/2019 Referral Source: [facility] Referral Reason: Chest pain   Outreach Attempt: spoke with patient.  She is able to verify HIPAA.  Advised patient on reason for call.  She states that she went to the ER.  She says they could not find anything wrong and sent her home.  Patient states she feels better today and offers no concerns. Advised patient to follow up with her primary care physician for further follow up.  She verbalized understanding.    Discussed [facility] services and support with patient.  She declines need for services at this time.    Plan: RN CM will close case.     Jone Baseman, RN, MSN [facility] Care Management Care Management Coordinator Direct Line [phone] Toll Free: [phone]  Fax: [phone]
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
## 2019-09-14 ENCOUNTER — Inpatient Hospital Stay
Admission: RE | Admit: 2019-09-14 | Discharge: 2019-09-18 | DRG: 885 | Disposition: A | Discharge status: Home / Self Care | Payer: Medicare Other | Source: Intra-hospital | Attending: Psychiatry | Admitting: Psychiatry

## 2019-09-14 ENCOUNTER — Other Ambulatory Visit: Payer: Self-pay

## 2019-09-14 DIAGNOSIS — T450X1A Poisoning by antiallergic and antiemetic drugs, accidental (unintentional), initial encounter: Secondary | ICD-10-CM | POA: Diagnosis present

## 2019-09-14 DIAGNOSIS — Z933 Colostomy status: Secondary | ICD-10-CM

## 2019-09-14 DIAGNOSIS — F603 Borderline personality disorder: Secondary | ICD-10-CM | POA: Diagnosis present

## 2019-09-14 DIAGNOSIS — Z86711 Personal history of pulmonary embolism: Secondary | ICD-10-CM

## 2019-09-14 DIAGNOSIS — F4310 Post-traumatic stress disorder, unspecified: Secondary | ICD-10-CM | POA: Diagnosis present

## 2019-09-14 DIAGNOSIS — J45909 Unspecified asthma, uncomplicated: Secondary | ICD-10-CM | POA: Diagnosis present

## 2019-09-14 DIAGNOSIS — G2581 Restless legs syndrome: Secondary | ICD-10-CM | POA: Diagnosis present

## 2019-09-14 DIAGNOSIS — F250 Schizoaffective disorder, bipolar type: Secondary | ICD-10-CM | POA: Diagnosis present

## 2019-09-14 DIAGNOSIS — F17200 Nicotine dependence, unspecified, uncomplicated: Secondary | ICD-10-CM | POA: Diagnosis present

## 2019-09-14 MED ORDER — ROPINIROLE HCL 0.25 MG PO TABS
0.5000 mg | ORAL_TABLET | Freq: Every day | ORAL | Status: DC
  Administered 2019-09-14 – 2019-09-17 (×4): 0.5 mg via ORAL
  Filled 2019-09-14 (×5): qty 2

## 2019-09-14 MED ORDER — QUETIAPINE FUMARATE 200 MG PO TABS
400.0000 mg | ORAL_TABLET | Freq: Every day | ORAL | Status: DC
  Administered 2019-09-14 – 2019-09-17 (×4): 400 mg via ORAL
  Filled 2019-09-14 (×4): qty 2

## 2019-09-14 MED ORDER — ACETAMINOPHEN 325 MG PO TABS: 650.0000 mg | ORAL_TABLET | Freq: Four times a day (QID) | ORAL | Status: DC | PRN

## 2019-09-14 MED ORDER — HYDROXYZINE HCL 25 MG PO TABS
25.0000 mg | ORAL_TABLET | Freq: Four times a day (QID) | ORAL | Status: DC
  Administered 2019-09-14 – 2019-09-18 (×15): 25 mg via ORAL
  Filled 2019-09-14 (×15): qty 1

## 2019-09-14 MED ORDER — OXCARBAZEPINE 300 MG PO TABS
300.0000 mg | ORAL_TABLET | Freq: Two times a day (BID) | ORAL | Status: DC
  Administered 2019-09-15 – 2019-09-18 (×7): 300 mg via ORAL
  Filled 2019-09-14 (×7): qty 1

## 2019-09-14 MED ORDER — MAGNESIUM HYDROXIDE 400 MG/5ML PO SUSP: 30.0000 mL | Freq: Every day | ORAL | Status: DC | PRN

## 2019-09-14 MED ORDER — PRAZOSIN HCL 2 MG PO CAPS
3.0000 mg | ORAL_CAPSULE | Freq: Every day | ORAL | Status: DC
  Administered 2019-09-14 – 2019-09-17 (×4): 3 mg via ORAL
  Filled 2019-09-14 (×4): qty 1

## 2019-09-14 MED ORDER — BUSPIRONE HCL 5 MG PO TABS
10.0000 mg | ORAL_TABLET | Freq: Three times a day (TID) | ORAL | Status: DC
  Administered 2019-09-15 – 2019-09-18 (×11): 10 mg via ORAL
  Filled 2019-09-14 (×11): qty 2

## 2019-09-14 MED ORDER — OLANZAPINE 5 MG PO TABS
5.0000 mg | ORAL_TABLET | Freq: Two times a day (BID) | ORAL | Status: DC
  Administered 2019-09-15: 5 mg via ORAL
  Filled 2019-09-14: qty 1

## 2019-09-14 MED ORDER — FUROSEMIDE 20 MG PO TABS
20.0000 mg | ORAL_TABLET | Freq: Every day | ORAL | Status: DC
  Administered 2019-09-15: 20 mg via ORAL
  Filled 2019-09-14 (×2): qty 1

## 2019-09-14 MED ORDER — NICOTINE 14 MG/24HR TD PT24
14.0000 mg | MEDICATED_PATCH | Freq: Every day | TRANSDERMAL | Status: DC
  Administered 2019-09-15: 14 mg via TRANSDERMAL
  Filled 2019-09-14 (×3): qty 1

## 2019-09-14 MED ORDER — ALUM & MAG HYDROXIDE-SIMETH 200-200-20 MG/5ML PO SUSP: 30.0000 mL | ORAL | Status: DC | PRN

## 2019-09-14 MED ORDER — MELOXICAM 7.5 MG PO TABS
15.0000 mg | ORAL_TABLET | Freq: Every day | ORAL | Status: DC
  Administered 2019-09-15 – 2019-09-18 (×4): 15 mg via ORAL
  Filled 2019-09-14 (×4): qty 2

## 2019-09-14 NOTE — Tx Team (Signed)
Initial Treatment Plan 09/14/2019 7:41 PM [name] [name] [id number]    PATIENT STRESSORS: Financial difficulties Medication change or noncompliance Substance abuse   PATIENT STRENGTHS: Average or above average intelligence Capable of independent living Motivation for treatment/growth Supportive family/friends   PATIENT IDENTIFIED PROBLEMS:   Intentional drug over dose    Depression/Anxiety    Body Image Disturbance           DISCHARGE CRITERIA:  Adequate post-discharge living arrangements Medical problems require only outpatient monitoring Motivation to continue treatment in a less acute level of care Reduction of life-threatening or endangering symptoms to within safe limits Safe-care adequate arrangements made  PRELIMINARY DISCHARGE PLAN: Attend 12-step recovery group Outpatient therapy Participate in family therapy Return to previous living arrangement  PATIENT/FAMILY INVOLVEMENT: This treatment plan has been presented to and reviewed with the patient, [name] [name],   The patient  have been given the opportunity to ask questions and make suggestions.  Clemens Catholic, RN 09/14/2019, 7:41 PM

## 2019-09-14 NOTE — Progress Notes (Signed)
Patient is a new admit a 48 year old female  from [facility], transported to our facility for continuation of care, currently patient is not talking and was brought in via a wheel chair, with a low grade fever 99.6, patient is sleepy but rousable , skin is warm to touch and respiration is 18, Hx. Of present illness indicates that patient was found in her car in a church parking lot . Stated that patient reports taken almost entire bottle of 25 mg of Benadryl to try and sleep she denies suicidal ideations or homicidal ideations. She reports that she was trying to go to sleep, and when ask why she was at the church perking lot , she said there was a cross and she was praying , currently patient is still sleeping and couldn't contract for safety , patient is placed in room 304 and secured for safety . Dr. Weber Cooks will be attending.

## 2019-09-15 MED ORDER — FLUPHENAZINE HCL 5 MG PO TABS
5.0000 mg | ORAL_TABLET | Freq: Two times a day (BID) | ORAL | Status: DC
  Administered 2019-09-15 – 2019-09-18 (×7): 5 mg via ORAL
  Filled 2019-09-15 (×8): qty 1

## 2019-09-15 MED ORDER — BENZTROPINE MESYLATE 1 MG PO TABS
0.5000 mg | ORAL_TABLET | Freq: Two times a day (BID) | ORAL | Status: DC
  Administered 2019-09-15 – 2019-09-18 (×7): 0.5 mg via ORAL
  Filled 2019-09-15 (×7): qty 1

## 2019-09-15 NOTE — Progress Notes (Signed)
Patient instructed if she starts to feel dizzy again to notify this writer and we will move her closer to the nursing station to keep a close watch on her and to aid her if needed.

## 2019-09-15 NOTE — BHH Group Notes (Signed)
LCSW Aftercare Discharge Planning Group Note   09/15/2019 1315  Type of Group and Topic: Psychoeducational Group:  Discharge Planning  Participation Level:  Did Not Attend  Description of Group  Discharge planning group reviews patient's anticipated discharge plans and assists patients to anticipate and address any barriers to wellness/recovery in the community.  Suicide prevention education is reviewed with patients in group.  Therapeutic Goals 1. Patients will state their anticipated discharge plan and mental health aftercare 2. Patients will identify potential barriers to wellness in the community setting 3. Patients will engage in problem solving, solution focused discussion of ways to anticipate and address barriers to wellness/recovery  Summary of Patient Progress   Plan for Discharge/Comments:    Transportation Means:   Supports:  Therapeutic Modalities: Motivational Interviewing    Joanne Chars, LCSW 09/15/2019 1:51 PM

## 2019-09-15 NOTE — Progress Notes (Signed)
Patient after receiving morning medications has proceeded to lay down and fall asleep. Will continue to monitor closely for falls risk safety.

## 2019-09-15 NOTE — Progress Notes (Addendum)
Patient reporting she is feeling better and not dizzy, but will continue to monitor closely for safety. No 1:1 observed to be needed by treatment team, so will notify [facility]. Pt. again Instructed to use call bell to request help for ambulation if she feels dizzy. High falls safety again reviewed with patient for safety.
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
## 2019-09-15 NOTE — Plan of Care (Addendum)
D: Pt during assessments denies SI/HI/AVH, able to verbally contract for safety. Pt is pleasant and cooperative, but forwards little and her affect is flat and guarded. Pt. Endorses her mood as, "depressed". Pt. Reported dizziness this morning, so we went over extensively high falls risk education for safety that the patient verbalized understanding. Denied need for walker or assistive devices.  A: Q x 15 minute observation checks in place for safety. Patient was and is provided with education throughout shift.  Patient was and will be given/offered medications per orders. Patient was and is encouraged to attend groups, participate in unit activities and continue with plan of care. Pt. Chart and plans of care reviewed. Pt. Given support and encouragement.   R: Patient is complaint with medication and unit procedures. Pt. Observed eating good, attending meals with encouragement. Pt. Throughout day so far has been resting in bed often, reportedly trying to get her energy back. Pt. Affect has been getting brighter, observably as day has progressed.    Problem: Safety: Goal: Ability to disclose and discuss suicidal ideas will improve Outcome: Progressing   Problem: Coping: Goal: Ability to interact with others will improve Outcome: Progressing   Problem: Health Behavior/Discharge Planning: Goal: Compliance with therapeutic regimen will improve Outcome: Progressing   Problem: Medication: Goal: Compliance with prescribed medication regimen will improve Outcome: Progressing   Problem: Coping: Goal: Coping ability will improve Outcome: Progressing

## 2019-09-15 NOTE — Progress Notes (Addendum)
Patient this morning is observed to be unsteady on her feet, reporting she is feeling dizzy. Pt. Thought process is visibly blocked and appears disorganized. Call placed to [facility] for coverage to place the patient on 1:1, but reportedly no staff is available at this time. Will continue to monitor closely for safety. Pt. Instructed to use call bell to request help for ambulation if she feels dizzy. High falls safety reviewed with patient.

## 2019-09-15 NOTE — Tx Team (Addendum)
Interdisciplinary Treatment and Diagnostic Plan Update  09/15/2019 Time of Session: 0840 [name] MRN: [id number]  Principal Diagnosis: Schizoaffective disorder, bipolar type [name] [name])  Secondary Diagnoses: Principal Problem:   Schizoaffective disorder, bipolar type ([name] [name])   Current Medications:  Current Facility-Administered Medications  Medication Dose Route Frequency Provider Last Rate Last Dose  . acetaminophen (TYLENOL) tablet 650 mg  650 mg Oral Q6H PRN [name], NP      . alum & mag hydroxide-simeth (MAALOX/MYLANTA) 200-200-20 MG/5ML suspension 30 mL  30 mL Oral Q4H PRN [name], NP      . benztropine (COGENTIN) tablet 0.5 mg  0.5 mg Oral BID [name], MD   0.5 mg at 09/15/19 0947  . busPIRone (BUSPAR) tablet 10 mg  10 mg Oral TID [name], NP   10 mg at 09/15/19 0736  . fluPHENAZine (PROLIXIN) tablet 5 mg  5 mg Oral BID [name], MD   5 mg at 09/15/19 0947  . furosemide (LASIX) tablet 20 mg  20 mg Oral Daily [name], NP   20 mg at 09/15/19 0737  . hydrOXYzine (ATARAX/VISTARIL) tablet 25 mg  25 mg Oral QID [name], NP   25 mg at 09/15/19 0736  . magnesium hydroxide (MILK OF MAGNESIA) suspension 30 mL  30 mL Oral Daily PRN [name], NP      . meloxicam [name]) tablet 15 mg  15 mg Oral Daily [name], NP   15 mg at 09/15/19 0736  . nicotine (NICODERM CQ - dosed in mg/24 hours) patch 14 mg  14 mg Transdermal Daily [name], NP   14 mg at 09/15/19 0737  . Oxcarbazepine (TRILEPTAL) tablet 300 mg  300 mg Oral BID [name], NP   300 mg at 09/15/19 0736  . prazosin (MINIPRESS) capsule 3 mg  3 mg Oral QHS [name], NP   3 mg at 09/14/19 2102  . QUEtiapine (SEROQUEL) tablet 400 mg  400 mg Oral QHS [name], NP   400 mg at 09/14/19 2102  . rOPINIRole (REQUIP) tablet 0.5 mg  0.5 mg Oral QHS [name], NP   0.5 mg at 09/14/19 2109   PTA Medications: Medications Prior to Admission  Medication Sig  Dispense Refill Last Dose  . albuterol (PROVENTIL HFA;VENTOLIN HFA) 108 (90 Base) MCG/ACT inhaler Inhale 2 puffs into the lungs every 4 (four) hours as needed for wheezing or shortness of breath. (Patient not taking: Reported on 02/09/2019)     . busPIRone (BUSPAR) 10 MG tablet Take 1 tablet (10 mg total) by mouth 2 (two) times daily. For anxiety 60 tablet 0   . EPINEPHrine (EPIPEN 2-PAK) 0.3 mg/0.3 mL IJ SOAJ injection Inject 0.3 mg into the muscle as needed for anaphylaxis.     . hydrOXYzine (ATARAX/VISTARIL) 10 MG tablet Take 1 tablet (10 mg total) by mouth 2 (two) times daily. (Patient not taking: Reported on 02/09/2019) 30 tablet 0   . hydrOXYzine (VISTARIL) 25 MG capsule Take 25 mg by mouth 3 (three) times daily.     . Oxcarbazepine (TRILEPTAL) 300 MG tablet Take 1 tablet (300 mg total) by mouth 2 (two) times daily. For mood stabilization (Patient taking differently: Take 300 mg by mouth 3 (three) times daily. For mood stabilization) 60 tablet 0   . prazosin (MINIPRESS) 1 MG capsule Take 2 mg by mouth at bedtime.      [name] QUEtiapine (SEROQUEL XR) 300 MG  24 hr tablet Take 300 mg by mouth at bedtime.       Patient Stressors: Financial difficulties Medication change or noncompliance Substance abuse  Patient Strengths: Average or above average intelligence Capable of independent living Motivation for treatment/growth Supportive family/friends  Treatment Modalities: Medication Management, Group therapy, Case management,  1 to 1 session with clinician, Psychoeducation, Recreational therapy.   Physician Treatment Plan for Primary Diagnosis: Schizoaffective disorder, bipolar type ([name] [name]) Long Term Goal(s): Improvement in symptoms so as ready for discharge Improvement in symptoms so as ready for discharge   Short Term Goals: Ability to identify changes in lifestyle to reduce recurrence of condition will improve Ability to verbalize feelings will improve Ability to disclose and discuss suicidal  ideas Ability to demonstrate self-control will improve Ability to identify and develop effective coping behaviors will improve Ability to maintain clinical measurements within normal limits will improve Compliance with prescribed medications will improve Ability to identify triggers associated with substance abuse/mental health issues will improve Ability to identify changes in lifestyle to reduce recurrence of condition will improve Ability to verbalize feelings will improve Ability to disclose and discuss suicidal ideas Ability to demonstrate self-control will improve Ability to identify and develop effective coping behaviors will improve Ability to maintain clinical measurements within normal limits will improve Compliance with prescribed medications will improve Ability to identify triggers associated with substance abuse/mental health issues will improve  Medication Management: Evaluate patient's response, side effects, and tolerance of medication regimen.  Therapeutic Interventions: 1 to 1 sessions, Unit Group sessions and Medication administration.  Evaluation of Outcomes: Not Met  Physician Treatment Plan for Secondary Diagnosis: Principal Problem:   Schizoaffective disorder, bipolar type ([name] [name])  Long Term Goal(s): Improvement in symptoms so as ready for discharge Improvement in symptoms so as ready for discharge   Short Term Goals: Ability to identify changes in lifestyle to reduce recurrence of condition will improve Ability to verbalize feelings will improve Ability to disclose and discuss suicidal ideas Ability to demonstrate self-control will improve Ability to identify and develop effective coping behaviors will improve Ability to maintain clinical measurements within normal limits will improve Compliance with prescribed medications will improve Ability to identify triggers associated with substance abuse/mental health issues will improve Ability to identify changes in  lifestyle to reduce recurrence of condition will improve Ability to verbalize feelings will improve Ability to disclose and discuss suicidal ideas Ability to demonstrate self-control will improve Ability to identify and develop effective coping behaviors will improve Ability to maintain clinical measurements within normal limits will improve Compliance with prescribed medications will improve Ability to identify triggers associated with substance abuse/mental health issues will improve     Medication Management: Evaluate patient's response, side effects, and tolerance of medication regimen.  Therapeutic Interventions: 1 to 1 sessions, Unit Group sessions and Medication administration.  Evaluation of Outcomes: Not Met   RN Treatment Plan for Primary Diagnosis: Schizoaffective disorder, bipolar type ([place]) Long Term Goal(s): Knowledge of disease and therapeutic regimen to maintain health will improve  Short Term Goals: Ability to identify and develop effective coping behaviors will improve and Compliance with prescribed medications will improve  Medication Management: RN will administer medications as ordered by provider, will assess and evaluate patient's response and provide education to patient for prescribed medication. RN will report any adverse and/or side effects to prescribing provider.  Therapeutic Interventions: 1 on 1 counseling sessions, Psychoeducation, Medication administration, Evaluate responses to treatment, Monitor vital signs and CBGs as ordered, Perform/monitor CIWA, COWS, AIMS and  Fall Risk screenings as ordered, Perform wound care treatments as ordered.  Evaluation of Outcomes: Not Met   LCSW Treatment Plan for Primary Diagnosis: Schizoaffective disorder, bipolar type ([place]) Long Term Goal(s): Safe transition to appropriate next level of care at discharge, Engage patient in therapeutic group addressing interpersonal concerns.  Short Term Goals: Engage patient in  aftercare planning with referrals and resources, Increase social support and Increase skills for wellness and recovery  Therapeutic Interventions: Assess for all discharge needs, 1 to 1 time with Social worker, Explore available resources and support systems, Assess for adequacy in community support network, Educate family and significant other(s) on suicide prevention, Complete Psychosocial Assessment, Interpersonal group therapy.  Evaluation of Outcomes: Not Met   Progress in Treatment: Attending groups: No. Participating in groups: No. Taking medication as prescribed: Yes. Toleration medication: Yes. Family/Significant other contact made: No, will contact:  when given permission Patient understands diagnosis: Yes. Discussing patient identified problems/goals with staff: Yes. Medical problems stabilized or resolved: Yes. Denies suicidal/homicidal ideation: Yes. Issues/concerns per patient self-inventory: No. Other: none  New problem(s) identified: No, Describe:  none  New Short Term/Long Term Goal(s):  Patient Goals:  "get rid of hallucinations"  Discharge Plan or Barriers:   Reason for Continuation of Hospitalization: Depression Hallucinations Medication stabilization  Estimated Length of Stay:3-5 days  Attendees: Patient:[name] 09/15/2019   Physician: Dr. Jake Samples, MD 09/15/2019   Nursing: Polly Cobia, RN 09/15/2019   RN Care Manager: 09/15/2019   Social Worker: Lurline Idol, LCSW 09/15/2019   Recreational Therapist:  09/15/2019   Other:  09/15/2019   Other:  09/15/2019   Other: 09/15/2019     Scribe for Treatment Team: Joanne Chars, [place] 09/15/2019 10:48 AM

## 2019-09-15 NOTE — [facility] Suicide Risk Assessment (Signed)
[facility] Admission Suicide Risk Assessment   Nursing information obtained from:  Patient Demographic factors:  NA Current Mental Status:  NA Loss Factors:  NA Historical Factors:  NA Risk Reduction Factors:  NA  Total Time spent with patient: 45 minutes Principal Problem: Schizoaffective disorder, bipolar type ([place]) Diagnosis:  Principal Problem:   Schizoaffective disorder, bipolar type ([place])  Subjective Data: Latest of multiple admissions/overdosed on Benadryl to self medicate auditory hallucinations away denies it was a suicide attempt  Continued Clinical Symptoms:  Alcohol Use Disorder Identification Test Final Score (AUDIT): 8 The "Alcohol Use Disorders Identification Test", Guidelines for Use in Primary Care, Second Edition.  World Pharmacologist [facility]). Score between 0-7:  no or low risk or alcohol related problems. Score between 8-15:  moderate risk of alcohol related problems. Score between 16-19:  high risk of alcohol related problems. Score 20 or above:  warrants further diagnostic evaluation for alcohol dependence and treatment.   CLINICAL FACTORS:   Depression:   Anhedonia  Musculoskeletal: Strength & Muscle Tone: decreased Gait & Station: normal Patient leans: N/A  Psychiatric Specialty Exam: Physical Exam  Nursing note and vitals reviewed. Constitutional: She appears well-developed and well-nourished.    Review of Systems  Constitutional: Negative.   Respiratory: Negative.   Cardiovascular: Negative.   Genitourinary: Negative.   Musculoskeletal: Negative.   Neurological: Negative.   Endo/Heme/Allergies: Negative.     Blood pressure 102/70, pulse 97, temperature 98.4 F (36.9 C), temperature source Oral, resp. rate 16, height 5\' 8"  (1.727 m), weight 99.8 kg, SpO2 96 %.Body mass index is 33.45 kg/m.  General Appearance: Casual  Eye Contact:  Fair  Speech:  Slow  Volume:  Decreased  Mood:  Depressed  Affect:  Blunt and Flat  Thought Process:  Goal  Directed and Descriptions of Associations: Circumstantial  Orientation:  Full (Time, Place, and Person)  Thought Content:  Hallucinations: Auditory  Suicidal Thoughts:  Yes.  without intent/plan as evidenced by recent overdose however denies current thoughts of harming self or others and denies that this was a suicide attempt despite the high number of Benadryl taken  Homicidal Thoughts:  No  Memory:  Immediate;   Fair Recent;   Fair Remote;   Fair  Judgement:  Fair  Insight:  Fair  Psychomotor Activity:  Decreased  Concentration:  Concentration: Fair and Attention Span: Fair  Recall:  [employer] of Knowledge:  Fair  Language:  Good  Akathisia:  Negative  Handed:  Right  AIMS (if indicated):     Assets:  Leisure Time Physical Health Resilience Social Support  ADL's:  Intact  Cognition:  WNL  Sleep:  Number of Hours: 5  \     COGNITIVE FEATURES THAT CONTRIBUTE TO RISK:  Loss of executive function and Polarized thinking    SUICIDE RISK:   Moderate:  Frequent suicidal ideation with limited intensity, and duration, some specificity in terms of plans, no associated intent, good self-control, limited dysphoria/symptomatology, some risk factors present, and identifiable protective factors, including available and accessible social support.  PLAN OF CARE: Admit for stabilization  I certify that inpatient services furnished can reasonably be expected to improve the patient's condition.   [name], MD 09/15/2019, 8:59 AM
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
## 2019-09-15 NOTE — [facility] Group Notes (Signed)
[place] Group Notes:  (Nursing/MHT/Case Management/Adjunct)  Date:  09/15/2019  Time:  8:49 PM  Type of Therapy:  Group Therapy  Participation Level:  Active  Participation Quality:  Appropriate  Affect:  Appropriate  Cognitive:  Alert  Insight:  Appropriate  Engagement in Group:  Engaged  Modes of Intervention:  Support  Summary of Progress/Problems:  [name] [name] 09/15/2019, 8:49 PM

## 2019-09-16 MED ORDER — FUROSEMIDE 20 MG PO TABS: 10.0000 mg | ORAL_TABLET | Freq: Every day | ORAL | Status: DC

## 2019-09-16 NOTE — Progress Notes (Signed)
Patient alert and oriented x 4, affect is flat but she brightens upon approach no distress noted interacting appropriately with peers and staff complaint with medication regimen, she denies SI/HI/AVH 15 minutes safety checks maintained will continue to monitor.

## 2019-09-16 NOTE — [facility] Group Notes (Signed)
LCSW Group Therapy Note 09/16/2019 1:15pm  Type of Therapy and Topic: Group Therapy: Feelings Around Returning Home & Establishing a Supportive Framework and Supporting Oneself When Supports Not Available  Participation Level: Active  Description of Group:  Patients first processed thoughts and feelings about upcoming discharge. These included fears of upcoming changes, lack of change, new living environments, judgements and expectations from others and overall stigma of mental health issues. The group then discussed the definition of a supportive framework, what that looks and feels like, and how do to discern it from an unhealthy non-supportive network. The group identified different types of supports as well as what to do when your family/friends are less than helpful or unavailable  Therapeutic Goals  1. Patient will identify one healthy supportive network that they can use at discharge. 2. Patient will identify one factor of a supportive framework and how to tell it from an unhealthy network. 3. Patient able to identify one coping skill to use when they do not have positive supports from others. 4. Patient will demonstrate ability to communicate their needs through discussion and/or role plays.  Summary of Patient Progress:  The patient reported she feels "good" and is grateful for her church family. Pt engaged during group session. As patients processed their anxiety about discharge and described healthy supports patient shared she is ready to be discharge tomorrow. Patient stated, "I feel more positive and I was able to rest." Patient listed her church family as her main support. Patients identified at least one self-care tool they were willing to use after discharge; walking with her dog.   Therapeutic Modalities Cognitive Behavioral Therapy Motivational Interviewing   Milad Bublitz  CUEBAS-COLON, LCSW 09/16/2019 10:34 AM

## 2019-09-16 NOTE — Progress Notes (Signed)
Attending aware of held medication for safety.         
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
## 2019-09-16 NOTE — Plan of Care (Signed)
D: Pt during assessments denies SI/HI/AVH, able to contract for safety. Pt is pleasant and cooperative, engagement is limited, but appropriate. Pt. Complains of a small headache, but refuses medication for this. Pt. Encouraged to increase fluids and to notify this writer if persists. Pt. Endorses a mostly normal mood.  A: Q x 15 minute observation checks in place for safety. Patient was and is provided with education throughout shift.  Patient was and will be given/offered medications per orders. Patient was and is encouraged to attend groups, participate in unit activities and continue with plan of care. Pt. Chart and plans of care reviewed. Pt. Given support and encouragement.   R: Patient is complaint with medication and unit procedures. Pt. Out in the milieu at times watching tv, but mostly in bed resting she verbalizes. Pt. Observed eating meals, eating good. Pt. Reports she feels safe to discharge and that coping has improved. Wants to continue progressions with therapist upon discharge.    Problem: Coping: Goal: Coping ability will improve Outcome: Progressing   Problem: Medication: Goal: Compliance with prescribed medication regimen will improve Outcome: Progressing   Problem: Self-Concept: Goal: Ability to disclose and discuss suicidal ideas will improve Outcome: Progressing   Problem: Activity: Goal: Interest or engagement in leisure activities will improve Outcome: Progressing   Problem: Safety: Goal: Ability to disclose and discuss suicidal ideas will improve Outcome: Progressing

## 2019-09-16 NOTE — Plan of Care (Signed)
Patient mood and affect is appropriate and stated that she could be going home tomorrow and for reason I'm exited, patient took her medicines and went back to her room , education and support is provided., denies any SI/HI/AVH did no request for any PRNs only requiring 15 minutes safety checks no distress.   Problem: Education: Goal: Ability to make informed decisions regarding treatment will improve Outcome: Progressing   Problem: Coping: Goal: Coping ability will improve Outcome: Progressing   Problem: Health Behavior/Discharge Planning: Goal: Identification of resources available to assist in meeting health care needs will improve Outcome: Progressing   Problem: Medication: Goal: Compliance with prescribed medication regimen will improve Outcome: Progressing   Problem: Self-Concept: Goal: Ability to disclose and discuss suicidal ideas will improve Outcome: Progressing Goal: Will verbalize positive feelings about self Outcome: Progressing   Problem: Education: Goal: Utilization of techniques to improve thought processes will improve Outcome: Progressing Goal: Knowledge of the prescribed therapeutic regimen will improve Outcome: Progressing   Problem: Activity: Goal: Interest or engagement in leisure activities will improve Outcome: Progressing Goal: Imbalance in normal sleep/wake cycle will improve Outcome: Progressing   Problem: Coping: Goal: Coping ability will improve Outcome: Progressing Goal: Will verbalize feelings Outcome: Progressing   Problem: Health Behavior/Discharge Planning: Goal: Ability to make decisions will improve Outcome: Progressing Goal: Compliance with therapeutic regimen will improve Outcome: Progressing   Problem: Role Relationship: Goal: Will demonstrate positive changes in social behaviors and relationships Outcome: Progressing   Problem: Safety: Goal: Ability to disclose and discuss suicidal ideas will improve Outcome:  Progressing Goal: Ability to identify and utilize support systems that promote safety will improve Outcome: Progressing   Problem: Self-Concept: Goal: Will verbalize positive feelings about self Outcome: Progressing Goal: Level of anxiety will decrease Outcome: Progressing   Problem: Education: Goal: Ability to state activities that reduce stress will improve Outcome: Progressing   Problem: Coping: Goal: Ability to identify and develop effective coping behavior will improve Outcome: Progressing   Problem: Self-Concept: Goal: Ability to identify factors that promote anxiety will improve Outcome: Progressing Goal: Level of anxiety will decrease Outcome: Progressing Goal: Ability to modify response to factors that promote anxiety will improve Outcome: Progressing   Problem: Activity: Goal: Will identify at least one activity in which they can participate Outcome: Progressing   Problem: Coping: Goal: Ability to identify and develop effective coping behavior will improve Outcome: Progressing Goal: Ability to interact with others will improve Outcome: Progressing Goal: Demonstration of participation in decision-making regarding own care will improve Outcome: Progressing Goal: Ability to use eye contact when communicating with others will improve Outcome: Progressing   Problem: Health Behavior/Discharge Planning: Goal: Identification of resources available to assist in meeting health care needs will improve Outcome: Progressing   Problem: Self-Concept: Goal: Will verbalize positive feelings about self Outcome: Progressing

## 2019-09-17 DIAGNOSIS — F250 Schizoaffective disorder, bipolar type: Principal | ICD-10-CM

## 2019-09-17 MED ORDER — OXCARBAZEPINE 300 MG PO TABS: 300.0000 mg | ORAL_TABLET | Freq: Two times a day (BID) | ORAL | 0 refills | Status: DC

## 2019-09-17 MED ORDER — MELOXICAM 15 MG PO TABS: 15.0000 mg | ORAL_TABLET | Freq: Every day | ORAL | 0 refills | Status: DC

## 2019-09-17 MED ORDER — BUSPIRONE HCL 10 MG PO TABS: 10.0000 mg | ORAL_TABLET | Freq: Three times a day (TID) | ORAL | 0 refills | Status: DC

## 2019-09-17 MED ORDER — FLUPHENAZINE HCL 5 MG PO TABS: 5.0000 mg | ORAL_TABLET | Freq: Two times a day (BID) | ORAL | 0 refills | Status: DC

## 2019-09-17 MED ORDER — ROPINIROLE HCL 0.5 MG PO TABS: 0.5000 mg | ORAL_TABLET | Freq: Every day | ORAL | 0 refills | Status: DC

## 2019-09-17 MED ORDER — QUETIAPINE FUMARATE 400 MG PO TABS: 400.0000 mg | ORAL_TABLET | Freq: Every day | ORAL | 0 refills | Status: DC

## 2019-09-17 MED ORDER — HYDROXYZINE HCL 25 MG PO TABS: 25.0000 mg | ORAL_TABLET | Freq: Four times a day (QID) | ORAL | 0 refills | Status: DC

## 2019-09-17 MED ORDER — BENZTROPINE MESYLATE 0.5 MG PO TABS: 0.5000 mg | ORAL_TABLET | Freq: Two times a day (BID) | ORAL | 0 refills | Status: DC

## 2019-09-17 MED ORDER — PRAZOSIN HCL 1 MG PO CAPS: 3.0000 mg | ORAL_CAPSULE | Freq: Every day | ORAL | 0 refills | Status: DC

## 2019-09-17 NOTE — [facility] Suicide Risk Assessment (Signed)
[place] INPATIENT:  Family/Significant Other Suicide Prevention Education  Suicide Prevention Education:  Education Completed; Durward Parcel, sister: [phone] has been identified by the patient as the family member/significant other with whom the patient will be residing, and identified as the person(s) who will aid the patient in the event of a mental health crisis (suicidal ideations/suicide attempt).  With written consent from the patient, the family member/significant other has been provided the following suicide prevention education, prior to the and/or following the discharge of the patient.  The suicide prevention education provided includes the following:  Suicide risk factors  Suicide prevention and interventions  National Suicide Hotline telephone number  [facility] assessment telephone number  [facility] Emergency Assistance [place] and/or Residential Mobile Crisis Unit telephone number  Request made of family/significant other to:  Remove weapons (e.g., guns, rifles, knives), all items previously/currently identified as safety concern.    Remove drugs/medications (over-the-counter, prescriptions, illicit drugs), all items previously/currently identified as a safety concern.  The family member/significant other verbalizes understanding of the suicide prevention education information provided.  The family member/significant other agrees to remove the items of safety concern listed above.  CSW spoke with pts sister, Amy who reported she just found out her sister was in the hospital yesterday and is unsure of why she is here. Per Amy she has no concerns with SI or HI. Pt lives with her mother and Amy has no concerns with her returning there at discharge. Amy reported there are no guns/weapons in the home.  [place] MSW LCSW 09/17/2019, 2:45 PM

## 2019-09-17 NOTE — Progress Notes (Signed)
CSW contacted [facility] to schedule a follow up appointment with [name] for pt. CSW left a HIPAA compliant voicemail requesting a call back.  CSW faxed information to [facility], and called to schedule Pt a follow up appointment. Appointment scheduled for Monday 09/24/2019 at 1:00Pm.  Evalina Field, MSW, LCSW Clinical Social Work 09/17/2019 2:11 PM
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
## 2019-09-17 NOTE — Progress Notes (Signed)
CSW received a call back from [name] who reported that pt is a frequent flyer with hospitals and was recently in [place] reported that pt needs a higher level of care due to her hospitalizations. Beth reported she has been trying to get ACTT services for pt but was unsuccessful due to pts insurance. Beth scheduled appointment for pt 9/28 at [place], MSW, LCSW Clinical Social Work 09/17/2019 3:20 PM

## 2019-09-17 NOTE — BHH Group Notes (Signed)
LCSW Group Therapy Note   09/17/2019 12:50 PM   Type of Therapy and Topic:  Group Therapy:  Overcoming Obstacles   Participation Level:  Minimal   Description of Group:    In this group patients will be encouraged to explore what they see as obstacles to their own wellness and recovery. They will be guided to discuss their thoughts, feelings, and behaviors related to these obstacles. The group will process together ways to cope with barriers, with attention given to specific choices patients can make. Each patient will be challenged to identify changes they are motivated to make in order to overcome their obstacles. This group will be process-oriented, with patients participating in exploration of their own experiences as well as giving and receiving support and challenge from other group members.   Therapeutic Goals: 1. Patient will identify personal and current obstacles as they relate to admission. 2. Patient will identify barriers that currently interfere with their wellness or overcoming obstacles.  3. Patient will identify feelings, thought process and behaviors related to these barriers. 4. Patient will identify two changes they are willing to make to overcome these obstacles:      Summary of Patient Progress Pt was present in group, but only participated to say that her current obstacle while in the hospital is staying warm.     Therapeutic Modalities:   Cognitive Behavioral Therapy Solution Focused Therapy Motivational Interviewing Relapse Prevention Therapy  [name] [name], MSW, LCSW Clinical Social Work 09/17/2019 12:50 PM

## 2019-09-17 NOTE — Plan of Care (Signed)
Patient is pleasant and cooperative on approach.Stated her goal for today is be positive and think clear.Denies SI,HI and AVH.Appropriate with staff & peers.Compliant with medications.Attended groups.Patient looking forward for discharge tomorrow.Support and encouragement given.

## 2019-09-17 NOTE — Plan of Care (Signed)
Patient is stable and appropriate , aware of impending discharge tomorrow, excited to  be going home to see her dog she said , denies any SI/HI/AVH, took her medicines , socialize with peers with out any issues , no signs of delusions , and did not request any PRNs, only require 15 minutes safety checks , education and support is provided. No distress.   Problem: Education: Goal: Ability to make informed decisions regarding treatment will improve Outcome: Progressing   Problem: Coping: Goal: Coping ability will improve Outcome: Progressing   Problem: Health Behavior/Discharge Planning: Goal: Identification of resources available to assist in meeting health care needs will improve Outcome: Progressing   Problem: Medication: Goal: Compliance with prescribed medication regimen will improve Outcome: Progressing   Problem: Self-Concept: Goal: Ability to disclose and discuss suicidal ideas will improve Outcome: Progressing Goal: Will verbalize positive feelings about self Outcome: Progressing   Problem: Education: Goal: Utilization of techniques to improve thought processes will improve Outcome: Progressing Goal: Knowledge of the prescribed therapeutic regimen will improve Outcome: Progressing   Problem: Activity: Goal: Interest or engagement in leisure activities will improve Outcome: Progressing Goal: Imbalance in normal sleep/wake cycle will improve Outcome: Progressing   Problem: Coping: Goal: Coping ability will improve Outcome: Progressing Goal: Will verbalize feelings Outcome: Progressing   Problem: Health Behavior/Discharge Planning: Goal: Ability to make decisions will improve Outcome: Progressing Goal: Compliance with therapeutic regimen will improve Outcome: Progressing   Problem: Role Relationship: Goal: Will demonstrate positive changes in social behaviors and relationships Outcome: Progressing   Problem: Safety: Goal: Ability to disclose and discuss  suicidal ideas will improve Outcome: Progressing Goal: Ability to identify and utilize support systems that promote safety will improve Outcome: Progressing   Problem: Self-Concept: Goal: Will verbalize positive feelings about self Outcome: Progressing Goal: Level of anxiety will decrease Outcome: Progressing   Problem: Education: Goal: Ability to state activities that reduce stress will improve Outcome: Progressing   Problem: Coping: Goal: Ability to identify and develop effective coping behavior will improve Outcome: Progressing   Problem: Self-Concept: Goal: Ability to identify factors that promote anxiety will improve Outcome: Progressing Goal: Level of anxiety will decrease Outcome: Progressing Goal: Ability to modify response to factors that promote anxiety will improve Outcome: Progressing   Problem: Activity: Goal: Will identify at least one activity in which they can participate Outcome: Progressing   Problem: Coping: Goal: Ability to identify and develop effective coping behavior will improve Outcome: Progressing Goal: Ability to interact with others will improve Outcome: Progressing Goal: Demonstration of participation in decision-making regarding own care will improve Outcome: Progressing Goal: Ability to use eye contact when communicating with others will improve Outcome: Progressing   Problem: Health Behavior/Discharge Planning: Goal: Identification of resources available to assist in meeting health care needs will improve Outcome: Progressing   Problem: Self-Concept: Goal: Will verbalize positive feelings about self Outcome: Progressing

## 2019-09-17 NOTE — Progress Notes (Signed)
Recreation Therapy Notes   Date: 09/17/2019  Time: 9:30 am   Location: Craft room  Behavioral response: Appropriate   Intervention Topic: Stress  Discussion/Intervention:  Group content on today was focused on stress. The group defined stress and way to cope with stress. Participants expressed how they know when they are stresses out. Individuals described the different ways they have to cope with stress. The group stated reasons why it is important to cope with stress. Patient explained what good stress is and some examples. The group participated in the intervention "Stress Management". Individuals were separated into two group and answered questions related to stress.   Clinical Observations/Feedback:  Patient came to group and identified a stressor that she is currently dealing with is money. She explained that she walks to help her manage her stress. Individual was social with peers and staff while participating in the intervention.   Nixon Kolton LRT/CTRS         Branson Kranz 09/17/2019 11:23 AM

## 2019-09-17 NOTE — Progress Notes (Signed)
Recreation Therapy Notes  INPATIENT RECREATION THERAPY ASSESSMENT  Patient Details Name: [name] [name] MRN: [id number] DOB: 1971/10/11 Today's Date: 09/17/2019       Information Obtained From: Patient  Able to Participate in Assessment/Interview: Yes  Patient Presentation: Responsive  Reason for Admission (Per Patient): Active Symptoms  Patient Stressors:    Coping Skills:   Exercise, Prayer  Leisure Interests (2+):  Exercise - Walking  Frequency of Recreation/Participation: Monthly  Awareness of Community Resources:  Yes  Community Resources:  Church  Current Use:    If no, Barriers?:    Expressed Interest in [place]:    [place] of Residence:  Lobbyist  Patient Main Form of Transportation: Musician  Patient Strengths:  support system  Patient Identified Areas of Improvement:  money  Patient Goal for Hospitalization:  being stable  Current SI (including self-harm):  No  Current HI:  No  Current AVH: No  Staff Intervention Plan: Group Attendance, Collaborate with Interdisciplinary Treatment Team  Consent to Intern Participation: N/A  Kenslei Hearty 09/17/2019, 2:33 PM

## 2019-09-17 NOTE — [facility] Suicide Risk Assessment (Signed)
[place] INPATIENT:  Family/Significant Other Suicide Prevention Education  Suicide Prevention Education:  Contact Attempts: Durward Parcel, sister: [phone] has been identified by the patient as the family member/significant other with whom the patient will be residing, and identified as the person(s) who will aid the patient in the event of a mental health crisis.  With written consent from the patient, two attempts were made to provide suicide prevention education, prior to and/or following the patient's discharge.  We were unsuccessful in providing suicide prevention education.  A suicide education pamphlet was given to the patient to share with family/significant other.  Date and time of first attempt: 09/17/2019 at 10:59am Date and time of second attempt:  CSW left a HIPAA compliant voicemail requesting a call back.   [place] MSW LCSW 09/17/2019, 10:59 AM

## 2019-09-18 MED ORDER — ROPINIROLE HCL 0.5 MG PO TABS: 0.5000 mg | ORAL_TABLET | Freq: Every day | ORAL | 1 refills | Status: DC

## 2019-09-18 MED ORDER — OXCARBAZEPINE 300 MG PO TABS: 300.0000 mg | ORAL_TABLET | Freq: Two times a day (BID) | ORAL | 1 refills | Status: DC

## 2019-09-18 MED ORDER — ALBUTEROL SULFATE HFA 108 (90 BASE) MCG/ACT IN AERS: 2.0000 | INHALATION_SPRAY | RESPIRATORY_TRACT | 1 refills | Status: DC | PRN

## 2019-09-18 MED ORDER — HYDROXYZINE HCL 25 MG PO TABS: 25.0000 mg | ORAL_TABLET | Freq: Four times a day (QID) | ORAL | 1 refills | Status: DC

## 2019-09-18 MED ORDER — PRAZOSIN HCL 1 MG PO CAPS: 3.0000 mg | ORAL_CAPSULE | Freq: Every day | ORAL | 1 refills | Status: DC

## 2019-09-18 MED ORDER — MELOXICAM 15 MG PO TABS: 15.0000 mg | ORAL_TABLET | Freq: Every day | ORAL | 1 refills | Status: DC

## 2019-09-18 MED ORDER — QUETIAPINE FUMARATE 400 MG PO TABS: 400.0000 mg | ORAL_TABLET | Freq: Every day | ORAL | 1 refills | Status: DC

## 2019-09-18 MED ORDER — BUSPIRONE HCL 10 MG PO TABS: 10.0000 mg | ORAL_TABLET | Freq: Three times a day (TID) | ORAL | 1 refills | Status: DC

## 2019-09-18 MED ORDER — FLUPHENAZINE HCL 5 MG PO TABS: 5.0000 mg | ORAL_TABLET | Freq: Two times a day (BID) | ORAL | 1 refills | Status: DC

## 2019-09-18 MED ORDER — BENZTROPINE MESYLATE 0.5 MG PO TABS: 0.5000 mg | ORAL_TABLET | Freq: Two times a day (BID) | ORAL | 1 refills | Status: DC

## 2019-09-18 NOTE — Progress Notes (Signed)
D: Patient is aware of  Discharge this shift .  A:Patient denies suicidal /homicidal ideations. Patient received all belongings brought in . No Storage medications. Writer reviewed Discharge Summary, Suicide Risk Assessment, and Transitional Record. Patient also received Prescriptions   from  MD. A 7 day supply of medications given to patien . Aware  Of follow up appointment .  R: Patient left unit with no questions  Or concerns  With taxi

## 2019-09-18 NOTE — [facility]HH Suicide Risk Assessment (Signed)
[facility] Discharge Suicide Risk Assessment   Principal Problem: Schizoaffective disorder, bipolar type [facility]) Discharge Diagnoses: Principal Problem:   Schizoaffective disorder, bipolar type ([place])   Total Time spent with patient: 45 minutes  Musculoskeletal: Strength & Muscle Tone: within normal limits Gait & Station: normal Patient leans: N/A  Psychiatric Specialty Exam: Review of Systems  Constitutional: Negative.   HENT: Negative.   Eyes: Negative.   Respiratory: Negative.   Cardiovascular: Negative.   Gastrointestinal: Negative.   Musculoskeletal: Negative.   Skin: Negative.   Neurological: Negative.   Psychiatric/Behavioral: Negative.     Blood pressure (!) 106/50, pulse 86, temperature 98 F (36.7 C), temperature source Oral, resp. rate 18, height 5\' 8"  (1.727 m), weight 99.8 kg, SpO2 99 %.Body mass index is 33.45 kg/m.  General Appearance: Casual  Eye Contact::  Good  Speech:  Clear and [id number]  Volume:  Normal  Mood:  Euthymic  Affect:  Constricted  Thought Process:  Goal Directed  Orientation:  Full (Time, Place, and Person)  Thought Content:  Logical  Suicidal Thoughts:  No  Homicidal Thoughts:  No  Memory:  Immediate;   Fair Recent;   Fair Remote;   Fair  Judgement:  Fair  Insight:  Fair  Psychomotor Activity:  Normal  Concentration:  Fair  Recall:  [employer] [place]  Language: Fair  Akathisia:  No  Handed:  Right  AIMS (if indicated):     Assets:  Desire for Improvement Housing Physical Health Resilience Social Support  Sleep:  Number of Hours: 7.75  Cognition: WNL  ADL's:  Intact   Mental Status Per Nursing Assessment::   On Admission:  NA  Demographic Factors:  Caucasian and Unemployed  Loss Factors: Financial problems/change in socioeconomic status  Historical Factors: Impulsivity  Risk Reduction Factors:   Sense of responsibility to family, Religious beliefs about death, Positive social support and Positive  therapeutic relationship  Continued Clinical Symptoms:  Schizophrenia:   Paranoid or undifferentiated type  Cognitive Features That Contribute To Risk:  None    Suicide Risk:  Minimal: No identifiable suicidal ideation.  Patients presenting with no risk factors but with morbid ruminations; may be classified as minimal risk based on the severity of the depressive symptoms  Follow-up [place], [place]. Go on 09/24/2019.   Why: You have an appointment scheduled for Monday 09/24/2019 at 1:00PM. Thank you! Contact information: [place] [address] [phone]        [facility] Follow up on 09/21/2019.   Why: You have an appointment with [name] 09/21/2019 at 8:00AM. Thank You! Contact information: [place], [place] [address] Phone: [phone] Fax: [phone]          Plan Of Care/Follow-up recommendations:  Activity:  Activity as tolerated Diet:  Regular diet Other:  [facility].  Alethia Berthold, MD 09/18/2019, 9:31 AM
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
## 2019-09-18 NOTE — Progress Notes (Signed)
Recreation Therapy Notes   Date: 09/18/2019  Time: 9:30 am   Location: Craft room  Behavioral response: Appropriate   Intervention Topic: Coping-Skills  Discussion/Intervention:  Group content on today was focused on coping skills. The group defined what coping skills are and when they can be used. Individuals described how they normally cope with thing and the coping skills they normally use. Patients expressed why it is important to cope with things and how not coping with things can affect you. The group participated in the intervention "Exploring coping skills" where they had a chance to test new coping skills they could use in the future.   Clinical Observations/Feedback:  Patient came to group and was focused on what peers and staff had to say about coping skills. Individual was social with peers and staff while participating in the intervention    Dondi Burandt LRT/CTRS         [place] 09/18/2019 10:56 AM

## 2019-09-18 NOTE — Progress Notes (Signed)
Recreation Therapy Notes  INPATIENT RECREATION TR PLAN  Patient Details Name: [name] [name] MRN: [id number] DOB: [birth year] Today's Date: 09/18/2019  Rec Therapy Plan Is patient appropriate for Therapeutic Recreation?: Yes Treatment times per week: at least 3 Estimated Length of Stay: 5-7 days TR Treatment/Interventions: Group participation (Comment)  Discharge Criteria Pt will be discharged from therapy if:: Discharged Treatment plan/goals/alternatives discussed and agreed upon by:: Patient/family  Discharge Summary Short term goals set: Patient will identify 3 positive coping skills strategies to use post d/c within 5 recreation therapy group sessions Short term goals met: Adequate for discharge Progress toward goals comments: Groups attended Which groups?: Stress management, Coping skills Reason goals not met: N/A Therapeutic equipment acquired: N/A Reason patient discharged from therapy: Discharge from hospital Pt/family agrees with progress & goals achieved: Yes Date patient discharged from therapy: 09/18/19   Kelyn Ponciano 09/18/2019, 11:41 AM

## 2019-09-18 NOTE — Progress Notes (Signed)
  [facility] Adult Case Management Discharge Plan :  Will you be returning to the same living situation after discharge:  Yes,  home At discharge, do you have transportation home?: Yes,  taxi will be provided Do you have the ability to pay for your medications: Yes,  Medicare Part A&B  Release of information consent forms completed and in the chart;    Patient to Follow up at: [place], [facility]. Go on 09/24/2019.   Why: You have an appointment scheduled for Monday 09/24/2019 at 1:00PM. Thank you! Contact information: [place] [address] [phone]        [facility] Follow up on 09/21/2019.   Why: You have an appointment with [name] 09/21/2019 at 8:00AM. Thank You! Contact information: [place], [place] [address] Phone: [phone] Fax: [phone]          Next level of care provider has access to [place] and Suicide Prevention discussed: Yes,  SPE completed with pts sister  Have you used any form of tobacco in the last 30 days? (Cigarettes, Smokeless Tobacco, Cigars, and/or Pipes): No  Has patient been referred to the Quitline?: N/A patient is not a smoker  Patient has been referred for addiction treatment: N/A  Delfin Edis, LCSW 09/18/2019, 9:18 AM

## 2019-09-18 NOTE — Plan of Care (Signed)
  Problem: Coping Skills Goal: STG - Patient will identify 3 positive coping skills strategies to use post d/c within 5 recreation therapy group sessions Description: STG - Patient will identify 3 positive coping skills strategies to use post d/c within 5 recreation therapy group sessions 09/18/2019 1138 by Ernest Haber, LRT Outcome: Adequate for Discharge 09/18/2019 1137 by Ernest Haber, LRT Outcome: Adequate for Discharge

## 2019-09-18 NOTE — Plan of Care (Signed)
  Problem: Education: Goal: Ability to make informed decisions regarding treatment will improve Outcome: Adequate for Discharge   Problem: Coping: Goal: Coping ability will improve Outcome: Adequate for Discharge   Problem: Health Behavior/Discharge Planning: Goal: Identification of resources available to assist in meeting health care needs will improve Outcome: Adequate for Discharge   Problem: Medication: Goal: Compliance with prescribed medication regimen will improve Outcome: Adequate for Discharge   Problem: Self-Concept: Goal: Ability to disclose and discuss suicidal ideas will improve Outcome: Adequate for Discharge Goal: Will verbalize positive feelings about self Outcome: Adequate for Discharge   Problem: Education: Goal: Utilization of techniques to improve thought processes will improve Outcome: Adequate for Discharge Goal: Knowledge of the prescribed therapeutic regimen will improve Outcome: Adequate for Discharge   Problem: Activity: Goal: Interest or engagement in leisure activities will improve Outcome: Adequate for Discharge Goal: Imbalance in normal sleep/wake cycle will improve Outcome: Adequate for Discharge   Problem: Coping: Goal: Coping ability will improve Outcome: Adequate for Discharge Goal: Will verbalize feelings Outcome: Adequate for Discharge   Problem: Health Behavior/Discharge Planning: Goal: Ability to make decisions will improve Outcome: Adequate for Discharge Goal: Compliance with therapeutic regimen will improve Outcome: Adequate for Discharge   Problem: Role Relationship: Goal: Will demonstrate positive changes in social behaviors and relationships Outcome: Adequate for Discharge   Problem: Safety: Goal: Ability to disclose and discuss suicidal ideas will improve Outcome: Adequate for Discharge Goal: Ability to identify and utilize support systems that promote safety will improve Outcome: Adequate for Discharge   Problem:  Self-Concept: Goal: Will verbalize positive feelings about self Outcome: Adequate for Discharge Goal: Level of anxiety will decrease Outcome: Adequate for Discharge   Problem: Education: Goal: Ability to state activities that reduce stress will improve Outcome: Adequate for Discharge   Problem: Coping: Goal: Ability to identify and develop effective coping behavior will improve Outcome: Adequate for Discharge   Problem: Self-Concept: Goal: Ability to identify factors that promote anxiety will improve Outcome: Adequate for Discharge Goal: Level of anxiety will decrease Outcome: Adequate for Discharge Goal: Ability to modify response to factors that promote anxiety will improve Outcome: Adequate for Discharge   Problem: Activity: Goal: Will identify at least one activity in which they can participate Outcome: Adequate for Discharge   Problem: Coping: Goal: Ability to identify and develop effective coping behavior will improve Outcome: Adequate for Discharge Goal: Ability to interact with others will improve Outcome: Adequate for Discharge Goal: Demonstration of participation in decision-making regarding own care will improve Outcome: Adequate for Discharge Goal: Ability to use eye contact when communicating with others will improve Outcome: Adequate for Discharge   Problem: Health Behavior/Discharge Planning: Goal: Identification of resources available to assist in meeting health care needs will improve Outcome: Adequate for Discharge   Problem: Self-Concept: Goal: Will verbalize positive feelings about self Outcome: Adequate for Discharge

## 2019-11-19 ENCOUNTER — Encounter (HOSPITAL_COMMUNITY): Payer: Self-pay

## 2020-01-01 DIAGNOSIS — F209 Schizophrenia, unspecified: Secondary | ICD-10-CM | POA: Diagnosis not present

## 2020-01-01 DIAGNOSIS — Z86711 Personal history of pulmonary embolism: Secondary | ICD-10-CM | POA: Diagnosis not present

## 2020-01-01 DIAGNOSIS — E119 Type 2 diabetes mellitus without complications: Secondary | ICD-10-CM | POA: Diagnosis not present

## 2020-01-01 DIAGNOSIS — E7800 Pure hypercholesterolemia, unspecified: Secondary | ICD-10-CM | POA: Diagnosis not present

## 2020-01-01 DIAGNOSIS — R69 Illness, unspecified: Secondary | ICD-10-CM | POA: Diagnosis not present

## 2020-01-01 DIAGNOSIS — Z03818 Encounter for observation for suspected exposure to other biological agents ruled out: Secondary | ICD-10-CM | POA: Diagnosis not present

## 2020-01-01 DIAGNOSIS — I10 Essential (primary) hypertension: Secondary | ICD-10-CM | POA: Diagnosis not present

## 2020-01-02 ENCOUNTER — Other Ambulatory Visit: Payer: Self-pay

## 2020-01-02 ENCOUNTER — Encounter (HOSPITAL_COMMUNITY): Payer: Self-pay | Admitting: Behavioral Health

## 2020-01-02 ENCOUNTER — Inpatient Hospital Stay (HOSPITAL_COMMUNITY)
Admission: AD | Admit: 2020-01-02 | Discharge: 2020-01-07 | DRG: 885 | Disposition: A | Discharge status: Home / Self Care | Payer: Medicare HMO | Source: Intra-hospital | Attending: Psychiatry | Admitting: Psychiatry

## 2020-01-02 DIAGNOSIS — R45851 Suicidal ideations: Secondary | ICD-10-CM | POA: Diagnosis present

## 2020-01-02 DIAGNOSIS — Z915 Personal history of self-harm: Secondary | ICD-10-CM

## 2020-01-02 DIAGNOSIS — Z880 Allergy status to penicillin: Secondary | ICD-10-CM

## 2020-01-02 DIAGNOSIS — F603 Borderline personality disorder: Secondary | ICD-10-CM | POA: Diagnosis present

## 2020-01-02 DIAGNOSIS — F411 Generalized anxiety disorder: Secondary | ICD-10-CM | POA: Diagnosis present

## 2020-01-02 DIAGNOSIS — F251 Schizoaffective disorder, depressive type: Secondary | ICD-10-CM | POA: Diagnosis present

## 2020-01-02 DIAGNOSIS — I10 Essential (primary) hypertension: Secondary | ICD-10-CM | POA: Diagnosis present

## 2020-01-02 DIAGNOSIS — K219 Gastro-esophageal reflux disease without esophagitis: Secondary | ICD-10-CM | POA: Diagnosis not present

## 2020-01-02 DIAGNOSIS — Z9114 Patient's other noncompliance with medication regimen: Secondary | ICD-10-CM | POA: Diagnosis not present

## 2020-01-02 DIAGNOSIS — Z818 Family history of other mental and behavioral disorders: Secondary | ICD-10-CM

## 2020-01-02 DIAGNOSIS — Z79899 Other long term (current) drug therapy: Secondary | ICD-10-CM

## 2020-01-02 DIAGNOSIS — Z791 Long term (current) use of non-steroidal anti-inflammatories (NSAID): Secondary | ICD-10-CM

## 2020-01-02 DIAGNOSIS — G4730 Sleep apnea, unspecified: Secondary | ICD-10-CM | POA: Diagnosis not present

## 2020-01-02 DIAGNOSIS — Z86711 Personal history of pulmonary embolism: Secondary | ICD-10-CM | POA: Diagnosis not present

## 2020-01-02 DIAGNOSIS — R69 Illness, unspecified: Secondary | ICD-10-CM | POA: Diagnosis not present

## 2020-01-02 DIAGNOSIS — F4310 Post-traumatic stress disorder, unspecified: Secondary | ICD-10-CM | POA: Diagnosis present

## 2020-01-02 DIAGNOSIS — Z85828 Personal history of other malignant neoplasm of skin: Secondary | ICD-10-CM

## 2020-01-02 DIAGNOSIS — Z888 Allergy status to other drugs, medicaments and biological substances status: Secondary | ICD-10-CM

## 2020-01-02 DIAGNOSIS — Z9884 Bariatric surgery status: Secondary | ICD-10-CM | POA: Diagnosis not present

## 2020-01-02 DIAGNOSIS — E785 Hyperlipidemia, unspecified: Secondary | ICD-10-CM | POA: Diagnosis not present

## 2020-01-02 DIAGNOSIS — F250 Schizoaffective disorder, bipolar type: Secondary | ICD-10-CM | POA: Diagnosis not present

## 2020-01-02 DIAGNOSIS — F17200 Nicotine dependence, unspecified, uncomplicated: Secondary | ICD-10-CM | POA: Diagnosis present

## 2020-01-02 MED ORDER — HYDROXYZINE HCL 25 MG PO TABS
25.0000 mg | ORAL_TABLET | Freq: Three times a day (TID) | ORAL | Status: DC | PRN
  Administered 2020-01-03 – 2020-01-06 (×4): 25 mg via ORAL
  Filled 2020-01-02 (×4): qty 1

## 2020-01-02 MED ORDER — ACETAMINOPHEN 325 MG PO TABS
650.0000 mg | ORAL_TABLET | Freq: Four times a day (QID) | ORAL | Status: DC | PRN
  Administered 2020-01-03 – 2020-01-04 (×2): 650 mg via ORAL
  Filled 2020-01-02 (×2): qty 2

## 2020-01-02 MED ORDER — TRAZODONE HCL 50 MG PO TABS: 50.0000 mg | ORAL_TABLET | Freq: Every evening | ORAL | Status: DC | PRN

## 2020-01-02 MED ORDER — MAGNESIUM HYDROXIDE 400 MG/5ML PO SUSP: 30.0000 mL | Freq: Every day | ORAL | Status: DC | PRN

## 2020-01-02 MED ORDER — ALUM & MAG HYDROXIDE-SIMETH 200-200-20 MG/5ML PO SUSP: 30.0000 mL | ORAL | Status: DC | PRN
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
## 2020-01-02 NOTE — Tx Team (Signed)
Initial Treatment Plan 01/02/2020 11:03 PM [name] [name] [id number]    PATIENT STRESSORS: Financial difficulties Legal issue Medication change or noncompliance   PATIENT STRENGTHS: Supportive family/friends   PATIENT IDENTIFIED PROBLEMS: Anxiety  Suicide ideation  "Be back on medication"                 DISCHARGE CRITERIA:  Ability to meet basic life and health needs Improved stabilization in mood, thinking, and/or behavior Medical problems require only outpatient monitoring Motivation to continue treatment in a less acute level of care  PRELIMINARY DISCHARGE PLAN: Attend aftercare/continuing care group Attend PHP/IOP Outpatient therapy Return to previous living arrangement  PATIENT/FAMILY INVOLVEMENT: This treatment plan has been presented to and reviewed with the patient, [name] [name], and/or family member.  The patient and family have been given the opportunity to ask questions and make suggestions.  Wolfgang Phoenix, RN 01/02/2020, 11:03 PM

## 2020-01-02 NOTE — Progress Notes (Signed)
[name] [name] is a 49 y.o. female involuntary admitted for increased anxiety. Pt stated she stopped taking her medications so she could take of her sick mother. Pt stated it has been very stressful caring for her mom, been feeling overwhelmed. Pt has also been hearing voices commanding her to harm herself. Pt stated she went to [name] where she was told to come to the hospital. Pt appears flat and depressed, but has been calm and cooperative with admission process. Pt denies SI/HI, AVH at this time and contracted for safety. Consents signed, skin/belongings search completed and pt oriented to unit. Pt stable at this time. Pt given the opportunity to express concerns and ask questions. Pt given toiletries. Will continue to monitor.

## 2020-01-03 DIAGNOSIS — F250 Schizoaffective disorder, bipolar type: Secondary | ICD-10-CM

## 2020-01-03 LAB — COMPREHENSIVE METABOLIC PANEL
ALT: 16 U/L (ref 0–44)
AST: 16 U/L (ref 15–41)
Albumin: 3.8 g/dL (ref 3.5–5.0)
Alkaline Phosphatase: 99 U/L (ref 38–126)
Anion gap: 8 (ref 5–15)
BUN: 13 mg/dL (ref 6–20)
CO2: 27 mmol/L (ref 22–32)
Calcium: 9 mg/dL (ref 8.9–10.3)
Chloride: 105 mmol/L (ref 98–111)
Creatinine, Ser: 0.93 mg/dL (ref 0.44–1.00)
GFR calc Af Amer: >60 mL/min (ref >60)
GFR calc non Af Amer: >60 mL/min (ref >60)
Glucose, Bld: 118 mg/dL — ABNORMAL HIGH (ref 70–99)
Potassium: 3.9 mmol/L (ref 3.5–5.1)
Sodium: 140 mmol/L (ref 135–145)
Total Bilirubin: 0.4 mg/dL (ref 0.3–1.2)
Total Protein: 6.8 g/dL (ref 6.5–8.1)

## 2020-01-03 LAB — HEPATIC FUNCTION PANEL
ALT: 15 U/L (ref 0–44)
AST: 18 U/L (ref 15–41)
Albumin: 3.8 g/dL (ref 3.5–5.0)
Alkaline Phosphatase: 97 U/L (ref 38–126)
Bilirubin, Direct: <0.1 mg/dL (ref 0.0–0.2)
Total Bilirubin: 0.6 mg/dL (ref 0.3–1.2)
Total Protein: 6.6 g/dL (ref 6.5–8.1)

## 2020-01-03 LAB — LIPID PANEL
Cholesterol: 218 mg/dL — ABNORMAL HIGH (ref 0–200)
HDL: 62 mg/dL (ref >40)
LDL Cholesterol: 140 mg/dL — ABNORMAL HIGH (ref 0–99)
Total CHOL/HDL Ratio: 3.5 RATIO
Triglycerides: 78 mg/dL (ref <150)
VLDL: 16 mg/dL (ref 0–40)

## 2020-01-03 LAB — TSH: TSH: 1.748 u[IU]/mL (ref 0.350–4.500)

## 2020-01-03 LAB — MAGNESIUM: Magnesium: 2.3 mg/dL (ref 1.7–2.4)

## 2020-01-03 LAB — GLUCOSE, CAPILLARY: Glucose-Capillary: 94 mg/dL (ref 70–99)

## 2020-01-03 LAB — ETHANOL: Alcohol, Ethyl (B): <10 mg/dL (ref <10)

## 2020-01-03 LAB — HEMOGLOBIN A1C
Hgb A1c MFr Bld: 5.7 % — ABNORMAL HIGH (ref 4.8–5.6)
Mean Plasma Glucose: 116.89 mg/dL

## 2020-01-03 MED ORDER — PRAZOSIN HCL 1 MG PO CAPS
1.0000 mg | ORAL_CAPSULE | Freq: Every day | ORAL | Status: DC
  Administered 2020-01-03 – 2020-01-06 (×4): 1 mg via ORAL
  Filled 2020-01-03 (×5): qty 1

## 2020-01-03 MED ORDER — FLUPHENAZINE HCL 5 MG PO TABS
5.0000 mg | ORAL_TABLET | Freq: Every day | ORAL | Status: DC
  Administered 2020-01-03 – 2020-01-06 (×4): 5 mg via ORAL
  Filled 2020-01-03 (×5): qty 1

## 2020-01-03 MED ORDER — MELOXICAM 15 MG PO TABS
15.0000 mg | ORAL_TABLET | Freq: Every day | ORAL | Status: DC
  Administered 2020-01-03 – 2020-01-07 (×5): 15 mg via ORAL
  Filled 2020-01-03 (×4): qty 1
  Filled 2020-01-03: qty 2
  Filled 2020-01-03 (×2): qty 1

## 2020-01-03 MED ORDER — QUETIAPINE FUMARATE ER 50 MG PO TB24
100.0000 mg | ORAL_TABLET | Freq: Every day | ORAL | Status: DC
  Administered 2020-01-03 – 2020-01-06 (×4): 100 mg via ORAL
  Filled 2020-01-03 (×6): qty 2

## 2020-01-03 MED ORDER — BENZTROPINE MESYLATE 0.5 MG PO TABS
0.5000 mg | ORAL_TABLET | Freq: Two times a day (BID) | ORAL | Status: DC
  Administered 2020-01-03 – 2020-01-07 (×8): 0.5 mg via ORAL
  Filled 2020-01-03 (×10): qty 1

## 2020-01-03 MED ORDER — HYDROXYZINE HCL 25 MG PO TABS: 25.0000 mg | ORAL_TABLET | Freq: Four times a day (QID) | ORAL | Status: DC | PRN

## 2020-01-03 MED ORDER — OXCARBAZEPINE 150 MG PO TABS
150.0000 mg | ORAL_TABLET | Freq: Two times a day (BID) | ORAL | Status: DC
  Administered 2020-01-03 – 2020-01-07 (×8): 150 mg via ORAL
  Filled 2020-01-03 (×10): qty 1

## 2020-01-03 MED ORDER — PANTOPRAZOLE SODIUM 40 MG PO TBEC
40.0000 mg | DELAYED_RELEASE_TABLET | Freq: Every day | ORAL | Status: DC
  Administered 2020-01-03 – 2020-01-07 (×5): 40 mg via ORAL
  Filled 2020-01-03 (×7): qty 1

## 2020-01-03 MED ORDER — BUSPIRONE HCL 5 MG PO TABS
5.0000 mg | ORAL_TABLET | Freq: Three times a day (TID) | ORAL | Status: DC
  Administered 2020-01-03 – 2020-01-07 (×12): 5 mg via ORAL
  Filled 2020-01-03 (×16): qty 1
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
## 2020-01-03 NOTE — Progress Notes (Signed)
   01/03/20 2100  Psych Admission Type (Psych Patients Only)  Admission Status Involuntary  Psychosocial Assessment  Patient Complaints Depression  Eye Contact Staring  Facial Expression Blank  Affect Anxious  Speech Logical/coherent  Interaction Assertive  Motor Activity Slow  Appearance/Hygiene In scrubs  Behavior Characteristics Cooperative  Mood Depressed;Anxious  Aggressive Behavior  Targets Self  Type of Behavior Weapon  Effect No apparent injury  Thought Process  Coherency WDL  Content WDL  Delusions None reported or observed  Perception WDL  Hallucination None reported or observed  Judgment WDL  Confusion WDL  Danger to Self  Current suicidal ideation? Denies  Danger to Others  Danger to Others None reported or observed   Pt stated she felt better this evening. Pt stated she feels better to be back on her medications. Pt stated she had to stop taking them due to having to take care of her mother.

## 2020-01-03 NOTE — [facility] Suicide Risk Assessment (Signed)
[facility] Admission Suicide Risk Assessment   Nursing information obtained from:  Patient Demographic factors:  Low socioeconomic status, Unemployed, Caucasian Current Mental Status:  Self-harm thoughts Loss Factors:  Decline in physical health, Decrease in vocational status Historical Factors:  Prior suicide attempts, Impulsivity Risk Reduction Factors:  Living with another person, especially a relative  Total Time spent with patient: 45 minutes Principal Problem:  Schizoaffective Disorder Diagnosis:  Active Problems:   Schizoaffective disorder, bipolar type (HCC)  Subjective Data:   Continued Clinical Symptoms:  Alcohol Use Disorder Identification Test Final Score (AUDIT): 0 The "Alcohol Use Disorders Identification Test", Guidelines for Use in Primary Care, Second Edition.  World Pharmacologist [facility]). Score between 0-7:  no or low risk or alcohol related problems. Score between 8-15:  moderate risk of alcohol related problems. Score between 16-19:  high risk of alcohol related problems. Score 20 or above:  warrants further diagnostic evaluation for alcohol dependence and treatment.   CLINICAL FACTORS:  49 year old female, history of schizoaffective disorder and GAD, presented for worsening depression/neurovegetative symptoms and auditory hallucinations, in the context of being off most of her medications for several weeks.  Reports she had stopped medications in order to focus on caring for her mother following surgery.   Psychiatric Specialty Exam: Physical Exam  Review of Systems  Blood pressure (!) 119/58, pulse 83, temperature 98.1 F (36.7 C), temperature source Oral, resp. rate 16, height 5\' 2"  (1.575 m), weight 92.5 kg, SpO2 100 %.Body mass index is 37.31 kg/m.  See admit note MSE   COGNITIVE FEATURES THAT CONTRIBUTE TO RISK:  Closed-mindedness and Loss of executive function    SUICIDE RISK:   Moderate:  Frequent suicidal ideation with limited intensity, and  duration, some specificity in terms of plans, no associated intent, good self-control, limited dysphoria/symptomatology, some risk factors present, and identifiable protective factors, including available and accessible social support.  PLAN OF CARE: Patient will be admitted to inpatient psychiatric unit for stabilization and safety. Will provide and encourage milieu participation. Provide medication management and maked adjustments as needed.  Will follow daily.    I certify that inpatient services furnished can reasonably be expected to improve the patient's condition.   Jenne Campus, MD 01/03/2020, 2:19 PM
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
## 2020-01-04 DIAGNOSIS — F251 Schizoaffective disorder, depressive type: Principal | ICD-10-CM

## 2020-01-04 LAB — PROLACTIN: Prolactin: 63.7 ng/mL — ABNORMAL HIGH (ref 4.8–23.3)

## 2020-01-04 NOTE — Progress Notes (Signed)
Recreation Therapy Notes  Date:  1.8.21 Time: 0930 Location: [address] Dayroom  Group Topic: Stress Management  Goal Area(s) Addresses:  Patient will identify positive stress management techniques. Patient will identify benefits of using stress management post d/c.  Behavioral Response:  Engaged  Intervention: Stress Management  Activity :  Meditation.  LRT played a meditation that focused on choices.  Patients were to listen and follow along as meditation played to engage.  Education:  Stress Management, Discharge Planning.   Education Outcome: Acknowledges Education  Clinical Observations/Feedback: Pt attended and participated in activity.    Victorino Sparrow, LRT/CTRS        Ria Comment, Malini Flemings A 01/04/2020 11:09 AM
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
## 2020-01-04 NOTE — [facility] Suicide Risk Assessment (Signed)
[place] INPATIENT:  Family/Significant Other Suicide Prevention Education  Suicide Prevention Education:  Education Completed; mother, Gearldine Shown [phone] has been identified by the patient as the family member/significant other with whom the patient will be residing, and identified as the person(s) who will aid the patient in the event of a mental health crisis (suicidal ideations/suicide attempt).  With written consent from the patient, the family member/significant other has been provided the following suicide prevention education, prior to the and/or following the discharge of the patient.  The suicide prevention education provided includes the following:  Suicide risk factors  Suicide prevention and interventions  National Suicide Hotline telephone number  [facility] assessment telephone number  [facility] Emergency Assistance [place] and/or Residential Mobile Crisis Unit telephone number  Request made of family/significant other to:  Remove weapons (e.g., guns, rifles, knives), all items previously/currently identified as safety concern.    Remove drugs/medications (over-the-counter, prescriptions, illicit drugs), all items previously/currently identified as a safety concern.  The family member/significant other verbalizes understanding of the suicide prevention education information provided.  The family member/significant other agrees to remove the items of safety concern listed above.  Joellen Jersey 01/04/2020, 9:32 AM

## 2020-01-04 NOTE — Progress Notes (Signed)
Adult Psychoeducational Group Note  Date:  01/04/2020 Time:  10:55 PM  Group Topic/Focus:  Wrap-Up Group:   The focus of this group is to help patients review their daily goal of treatment and discuss progress on daily workbooks.  Participation Level:  Minimal  Participation Quality:  Appropriate  Affect:  Appropriate  Cognitive:  Appropriate  Insight: Appropriate  Engagement in Group:  Engaged  Modes of Intervention:  Discussion  Additional Comments:  Pt said her day was a 52. Her goal find a place to go for 3 weeks to work on herself. No and yes. She do not have coping skills  Barbette Hair 01/04/2020, 10:55 PM

## 2020-01-04 NOTE — Tx Team (Signed)
Interdisciplinary Treatment and Diagnostic Plan Update  01/04/2020 Time of Session: 9:00am [name] [name] MRN: [id number]  Principal Diagnosis: <principal problem not specified>  Secondary Diagnoses: Active Problems:   Schizoaffective disorder, bipolar type ([place])   Current Medications:  Current Facility-Administered Medications  Medication Dose Route Frequency Provider Last Rate Last Admin  . acetaminophen (TYLENOL) tablet 650 mg  650 mg Oral Q6H PRN Anike, Adaku C, NP   650 mg at 01/03/20 2217  . alum & mag hydroxide-simeth (MAALOX/MYLANTA) 200-200-20 MG/5ML suspension 30 mL  30 mL Oral Q4H PRN Anike, Adaku C, NP      . benztropine (COGENTIN) tablet 0.5 mg  0.5 mg Oral BID Cobos, Myer Peer, MD   0.5 mg at 01/04/20 0724  . busPIRone (BUSPAR) tablet 5 mg  5 mg Oral TID Cobos, Myer Peer, MD   5 mg at 01/04/20 0724  . fluPHENAZine (PROLIXIN) tablet 5 mg  5 mg Oral QHS Cobos, Myer Peer, MD   5 mg at 01/03/20 2134  . hydrOXYzine (ATARAX/VISTARIL) tablet 25 mg  25 mg Oral TID PRN Anike, Adaku C, NP   25 mg at 01/03/20 1341  . magnesium hydroxide (MILK OF MAGNESIA) suspension 30 mL  30 mL Oral Daily PRN Anike, Adaku C, NP      . meloxicam (MOBIC) tablet 15 mg  15 mg Oral Daily Cobos, Myer Peer, MD   15 mg at 01/04/20 0724  . OXcarbazepine (TRILEPTAL) tablet 150 mg  150 mg Oral BID Cobos, Myer Peer, MD   150 mg at 01/04/20 0724  . pantoprazole (PROTONIX) EC tablet 40 mg  40 mg Oral Daily Cobos, Myer Peer, MD   40 mg at 01/04/20 0724  . prazosin (MINIPRESS) capsule 1 mg  1 mg Oral QHS Cobos, Myer Peer, MD   1 mg at 01/03/20 2134  . QUEtiapine (SEROQUEL XR) 24 hr tablet 100 mg  100 mg Oral QHS Cobos, Myer Peer, MD   100 mg at 01/03/20 2133   PTA Medications: Medications Prior to Admission  Medication Sig Dispense Refill Last Dose  . Aspirin-Acetaminophen-Caffeine (PAMPRIN MAX PO) Take 2 tablets by mouth every 6 (six) hours as needed (For pain.).    01/01/2020  . benztropine (COGENTIN)  0.5 MG tablet Take 1 tablet (0.5 mg total) by mouth 2 (two) times daily. 60 tablet 1 Past Month  . busPIRone (BUSPAR) 10 MG tablet Take 1 tablet (10 mg total) by mouth 3 (three) times daily. 90 tablet 1 Past Month  . fluPHENAZine (PROLIXIN) 5 MG tablet Take 1 tablet (5 mg total) by mouth 2 (two) times daily. (Patient taking differently: Take 5 mg by mouth at bedtime. ) 60 tablet 1 Past Month  . hydrOXYzine (VISTARIL) 25 MG capsule Take 25 mg by mouth 4 (four) times daily.    01/02/2020  . meloxicam (MOBIC) 15 MG tablet Take 1 tablet (15 mg total) by mouth daily. 30 tablet 1 01/02/2020  . Oxcarbazepine (TRILEPTAL) 300 MG tablet Take 1 tablet (300 mg total) by mouth 2 (two) times daily. (Patient taking differently: Take 300 mg by mouth 3 (three) times daily. ) 60 tablet 1 Past Month  . prazosin (MINIPRESS) 1 MG capsule Take 3 mg by mouth at bedtime.   Past Month  . QUEtiapine (SEROQUEL XR) 400 MG 24 hr tablet Take 400 mg by mouth every evening.    01/02/2020  . rOPINIRole (REQUIP) 2 MG tablet Take 2 mg by mouth at bedtime.   Past Month    Patient  Stressors: Arts development officer issue Medication change or noncompliance  Patient Strengths: Supportive family/friends  Treatment Modalities: Medication Management, Group therapy, Case management,  1 to 1 session with clinician, Psychoeducation, Recreational therapy.   Physician Treatment Plan for Primary Diagnosis: <principal problem not specified> Long Term Goal(s): Improvement in symptoms so as ready for discharge Improvement in symptoms so as ready for discharge   Short Term Goals: Ability to identify changes in lifestyle to reduce recurrence of condition will improve Ability to verbalize feelings will improve Ability to disclose and discuss suicidal ideas Ability to demonstrate self-control will improve Ability to identify and develop effective coping behaviors will improve Ability to maintain clinical measurements within normal limits  will improve Ability to identify changes in lifestyle to reduce recurrence of condition will improve Ability to verbalize feelings will improve Ability to disclose and discuss suicidal ideas Ability to demonstrate self-control will improve Ability to identify and develop effective coping behaviors will improve Ability to maintain clinical measurements within normal limits will improve  Medication Management: Evaluate patient's response, side effects, and tolerance of medication regimen.  Therapeutic Interventions: 1 to 1 sessions, Unit Group sessions and Medication administration.  Evaluation of Outcomes: Progressing  Physician Treatment Plan for Secondary Diagnosis: Active Problems:   Schizoaffective disorder, bipolar type ([place])  Long Term Goal(s): Improvement in symptoms so as ready for discharge Improvement in symptoms so as ready for discharge   Short Term Goals: Ability to identify changes in lifestyle to reduce recurrence of condition will improve Ability to verbalize feelings will improve Ability to disclose and discuss suicidal ideas Ability to demonstrate self-control will improve Ability to identify and develop effective coping behaviors will improve Ability to maintain clinical measurements within normal limits will improve Ability to identify changes in lifestyle to reduce recurrence of condition will improve Ability to verbalize feelings will improve Ability to disclose and discuss suicidal ideas Ability to demonstrate self-control will improve Ability to identify and develop effective coping behaviors will improve Ability to maintain clinical measurements within normal limits will improve     Medication Management: Evaluate patient's response, side effects, and tolerance of medication regimen.  Therapeutic Interventions: 1 to 1 sessions, Unit Group sessions and Medication administration.  Evaluation of Outcomes: Progressing   RN Treatment Plan for Primary  Diagnosis: <principal problem not specified> Long Term Goal(s): Knowledge of disease and therapeutic regimen to maintain health will improve  Short Term Goals: Ability to identify and develop effective coping behaviors will improve and Compliance with prescribed medications will improve  Medication Management: RN will administer medications as ordered by provider, will assess and evaluate patient's response and provide education to patient for prescribed medication. RN will report any adverse and/or side effects to prescribing provider.  Therapeutic Interventions: 1 on 1 counseling sessions, Psychoeducation, Medication administration, Evaluate responses to treatment, Monitor vital signs and CBGs as ordered, Perform/monitor CIWA, COWS, AIMS and Fall Risk screenings as ordered, Perform wound care treatments as ordered.  Evaluation of Outcomes: Progressing   LCSW Treatment Plan for Primary Diagnosis: <principal problem not specified> Long Term Goal(s): Safe transition to appropriate next level of care at discharge, Engage patient in therapeutic group addressing interpersonal concerns.  Short Term Goals: Engage patient in aftercare planning with referrals and resources, Increase social support, Increase emotional regulation, Identify triggers associated with mental health/substance abuse issues and Increase skills for wellness and recovery  Therapeutic Interventions: Assess for all discharge needs, 1 to 1 time with Social worker, Explore available resources and support systems, Assess  for adequacy in community support network, Educate family and significant other(s) on suicide prevention, Complete Psychosocial Assessment, Interpersonal group therapy.  Evaluation of Outcomes: Progressing   Progress in Treatment: Attending groups: No. Participating in groups: No. Taking medication as prescribed: Yes. Toleration medication: Yes. Family/Significant other contact made: No, will contact:   mother. Patient understands diagnosis: Yes. Discussing patient identified problems/goals with staff: Yes. Medical problems stabilized or resolved: No. Denies suicidal/homicidal ideation: No. Issues/concerns per patient self-inventory: Yes.  New problem(s) identified: Yes, Describe:  family stressors  New Short Term/Long Term Goal(s):  medication management for mood stabilization; elimination of SI thoughts; development of comprehensive mental wellness/sobriety plan.  Patient Goals: 'get back on my meds'  Discharge Plan or Barriers: Expected to return home and continue with her current outpatient providers at [name] and [facility].  Reason for Continuation of Hospitalization: Anxiety Depression Hallucinations Medication stabilization  Estimated Length of Stay: 5-7 days  Attendees: Patient: [name] [name]" [facility] 01/04/2020 9:22 AM  Physician: Larene Beach 01/04/2020 9:22 AM  Nursing: Vladimir Faster, RN 01/04/2020 9:22 AM  RN Care Manager: 01/04/2020 9:22 AM  Social Worker: Stephanie Acre, [place] 01/04/2020 9:22 AM  Recreational Therapist:  01/04/2020 9:22 AM  Other:  01/04/2020 9:22 AM  Other:  01/04/2020 9:22 AM  Other: 01/04/2020 9:22 AM    Scribe for Treatment Team: Joellen Jersey, LCSWA 01/04/2020 9:22 AM
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
## 2020-01-05 NOTE — Progress Notes (Signed)
[facility] Notes:  (Nursing/MHT/Case Management/Adjunct)  Date:  01/05/2020  Time:  2030 Type of Therapy:  wrap up group  Participation Level:  Active  Participation Quality:  Appropriate, Attentive, Sharing and Supportive  Affect:  Appropriate  Cognitive:  Appropriate  Insight:  Improving  Engagement in Group:  Engaged  Modes of Intervention:  Clarification, Education and Support  Summary of Progress/Problems:  Pt shared her good news of the day was that her mom with whom she cares for is healing and has only 2 wounds to pack now. Pt is grateful for her rat terrier dog and pt plans on following the good advice that she hasnt been taking.   Shellia Cleverly 01/05/2020, 9:35 PM

## 2020-01-05 NOTE — Progress Notes (Signed)
Patient ID: [name] [name], female   DOB: 18-Dec-1971, 49 y.o.   MRN: [id number]  D: Patient reports mood better tonight. Smiling and laughing with peers in the dayroom. No active SI at this time. Reports at home she took 400 mg of seroquel but only getting 100mg  here. A: Staff will monitor on q 15 minute checks, follow treatment plan, and give medications as prescribed. R: Cooperative on the unit.

## 2020-01-05 NOTE — Progress Notes (Signed)
   01/05/20 1200  Psych Admission Type (Psych Patients Only)  Admission Status Voluntary  Psychosocial Assessment  Patient Complaints None  Eye Contact Fair  Facial Expression Other (Comment) (smiling)  Affect Appropriate to circumstance  Speech Logical/coherent  Interaction Assertive  Motor Activity Slow  Appearance/Hygiene Unremarkable  Behavior Characteristics Cooperative  Mood Anxious  Aggressive Behavior  Effect No apparent injury  Thought Process  Coherency WDL  Content WDL  Delusions WDL  Perception WDL  Hallucination None reported or observed  Judgment WDL  Confusion WDL  Danger to Self  Current suicidal ideation? Denies  Danger to Others  Danger to Others None reported or observed

## 2020-01-05 NOTE — [facility] Group Notes (Signed)
LCSW Group Therapy Note  01/05/2020   10:00-11:00am   Type of Therapy and Topic:  Group Therapy: Anger Cues and Responses  Participation Level:  Active   Description of Group:   In this group, patients learned how to recognize the physical, cognitive, emotional, and behavioral responses they have to anger-provoking situations.  They identified a recent time they became angry and how they reacted.  They analyzed how their reaction was possibly beneficial and how it was possibly unhelpful.  The group discussed a variety of healthier coping skills that could help with such a situation in the future.  Focus was placed on how helpful it is to recognize the underlying emotions to our anger, because working on those can lead to a more permanent solution as well as our ability to focus on the important rather than the urgent.  Therapeutic Goals: Patients will remember their last incident of anger and how they felt emotionally and physically, what their thoughts were at the time, and how they behaved. Patients will identify how their behavior at that time worked for them, as well as how it worked against them. Patients will explore possible new behaviors to use in future anger situations. Patients will learn that anger itself is normal and cannot be eliminated, and that healthier reactions can assist with resolving conflict rather than worsening situations.  Summary of Patient Progress:  The patient shared that her most recent time of anger was recently when her mother who is battling cancer got sick and so she took her to the Emergency Room.  They were there, with patient unable to go into the emergency room with her very sick mother, for 7 hours and still were not seen so they went home.  She just called EMS the next morning and had a better experience.  She contributed infrequently but helpfully to the discussion and showed insight.  Therapeutic Modalities:   Cognitive Behavioral Therapy  Maretta Los

## 2020-01-06 NOTE — Progress Notes (Addendum)
Pt denied SI/HI. Pt reported decreased depression and anxiety today. Pt denied AVH. Pt reported fair sleep at bedtime.    01/06/20 0900  Psych Admission Type (Psych Patients Only)  Admission Status Voluntary  Psychosocial Assessment  Patient Complaints None  Eye Contact Fair  Facial Expression Animated  Affect Anxious  Speech Logical/coherent  Interaction Assertive  Motor Activity Slow  Appearance/Hygiene Unremarkable  Behavior Characteristics Appropriate to situation  Mood Anxious  Aggressive Behavior  Effect No apparent injury  Thought Process  Coherency WDL  Content WDL  Delusions None reported or observed  Perception WDL  Hallucination None reported or observed  Judgment WDL  Confusion None  Danger to Self  Current suicidal ideation? Denies  Danger to Others  Danger to Others None reported or observed

## 2020-01-06 NOTE — [facility] Group Notes (Signed)
[facility] LCSW Group Therapy Note  01/06/2020  10:00-11:00AM  Type of Therapy and Topic:  Group Therapy:  A Hero Worthy of Support  Participation Level:  Active   Description of Group:  Patients in this group were introduced to the concept that additional supports including self-support are an essential part of recovery.  Matching needs with supports to help fulfill those needs was explained.  Establishing boundaries that can gradually be increased or decreased was described, with patients giving their own examples of establishing appropriate boundaries in their lives.  A song entitled "My Own Hero" was played and a group discussion ensued in which patients stated it inspired them to help themselves in order to succeed, because other people cannot achieve their goals such as sobriety or stability for them.  A song was played called "I Am Enough" which led to a discussion about being willing to believe we are worth the effort of being a self-support.   Therapeutic Goals: 1)  demonstrate the importance of being a key part of one's own support system 2)  discuss various available supports 3)  encourage patient to use music as part of their self-support and focus on goals 4)  elicit ideas from patients about supports that need to be added   Summary of Patient Progress:  The patient expressed that people in her church are healthy supports for her while her sister is unhealthy because she is controlling and does not listen.  She was attentive with a broad affect throughout group.  She was focused also on implementing a successful discharge plan tomorrow, including how she will get home.  Therapeutic Modalities:   Motivational Interviewing Activity  Maretta Los

## 2020-01-06 NOTE — Progress Notes (Signed)
Pt did attend the evening wrap up group. Pt was attentive and supportive. Positive thinking and positive change were discussed.

## 2020-01-06 NOTE — Progress Notes (Signed)
[facility] Group Notes:  (Nursing/MHT/Case Management/Adjunct)  Date:  01/06/2020  Time:  1300  Type of Therapy:  Nurse Education discussed goals for hospitalization   Participation Level:  Active  Participation Quality:  Appropriate  Affect:  Appropriate  Cognitive:  Appropriate  Insight:  Appropriate  Engagement in Group:  Engaged  Modes of Intervention:  Discussion and Education  Summary of Progress/Problems:
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
## 2020-01-07 MED ORDER — FLUPHENAZINE HCL 5 MG PO TABS: 5.0000 mg | ORAL_TABLET | Freq: Every day | ORAL | 0 refills | Status: DC

## 2020-01-07 MED ORDER — BENZTROPINE MESYLATE 0.5 MG PO TABS: 0.5000 mg | ORAL_TABLET | Freq: Two times a day (BID) | ORAL | 0 refills | Status: DC

## 2020-01-07 MED ORDER — HYDROXYZINE HCL 25 MG PO TABS: 25.0000 mg | ORAL_TABLET | Freq: Three times a day (TID) | ORAL | 0 refills | Status: DC | PRN

## 2020-01-07 MED ORDER — PRAZOSIN HCL 1 MG PO CAPS: 1.0000 mg | ORAL_CAPSULE | Freq: Every day | ORAL | 0 refills | Status: DC

## 2020-01-07 MED ORDER — OXCARBAZEPINE 150 MG PO TABS: 150.0000 mg | ORAL_TABLET | Freq: Two times a day (BID) | ORAL | 0 refills | Status: DC

## 2020-01-07 MED ORDER — BUSPIRONE HCL 5 MG PO TABS: 5.0000 mg | ORAL_TABLET | Freq: Three times a day (TID) | ORAL | 0 refills | Status: DC

## 2020-01-07 MED ORDER — QUETIAPINE FUMARATE ER 50 MG PO TB24: 100.0000 mg | ORAL_TABLET | Freq: Every day | ORAL | 0 refills | Status: DC

## 2020-01-07 MED ORDER — PANTOPRAZOLE SODIUM 40 MG PO TBEC: 40.0000 mg | DELAYED_RELEASE_TABLET | Freq: Every day | ORAL | 0 refills | Status: DC

## 2020-01-07 NOTE — BHH Suicide Risk Assessment (Signed)
[facility] Discharge Suicide Risk Assessment   Principal Problem: Schizoaffective disorder, depressive type ([place]) Discharge Diagnoses: Principal Problem:   Schizoaffective disorder, depressive type ([place]) Active Problems:   Schizoaffective disorder, bipolar type ([place])   Total Time spent with patient: 30 minutes  Musculoskeletal: Strength & Muscle Tone: within normal limits Gait & Station: normal Patient leans: N/A  Psychiatric Specialty Exam: Review of Systems no headache, no chest pain, no shortness of breath, no nausea or vomiting   Blood pressure 109/80, pulse (!) 105, temperature 98.1 F (36.7 C), temperature source Oral, resp. rate 20, height 5\' 2"  (1.575 m), weight 92.5 kg, SpO2 98 %.Body mass index is 37.31 kg/m.  General Appearance: Well Groomed  Eye Contact::  Good  Speech:  Normal Rate409  Volume:  Normal  Mood:  reports much improved mood and currently denies feeling depressed and presents euthymic at this time  Affect:  appropriate, reactive   Thought Process:  Linear and Descriptions of Associations: Intact  Orientation:  Full (Time, Place, and Person)  Thought Content:  no hallucinations, no delusions, not internally preoccupied   Suicidal Thoughts:  No denies any suicidal or self injurious ideations, denies homicidal or violent ideations  Homicidal Thoughts:  No  Memory:  recent and remote grossly intact   Judgement:  Other:  improving   Insight:  improving   Psychomotor Activity:  Normal- no current restlessness or agitation  Concentration:  Good  Recall:  Good  Fund of Knowledge:Good  Language: Good  Akathisia:  Negative  Handed:  Right  AIMS (if indicated):     Assets:  Desire for Improvement Resilience  Sleep:  Number of Hours: 6.25  Cognition: WNL  ADL's:  Intact   Mental Status Per Nursing Assessment::   On Admission:  Self-harm thoughts  Demographic Factors:  49 year old female, two adult children, patient lives with mother   Loss Factors: Mother  has renal cancer and had infection post op, patient was focusing on caring for mother prior to this admission, had stopped some of her psychiatric medications  Historical Factors: Has been diagnosed with Schizoaffective Disorder in the past. History of suicide attempt by shooting self in 2012.   Risk Reduction Factors:   Sense of responsibility to family, Living with another person, especially a relative and Positive coping skills or problem solving skills She also reports she is starting to volunteer this week and continue to participate in church activities   Continued Clinical Symptoms:  At this time patient is alert, attentive, well related, well groomed, mood is improved and currently denies feeling depressed, presents euthymic, affect is appropriate and full in range, no thought disorder, no hallucinations, no delusions, not internally preoccupied , future oriented . Denies medication side effects. We reviewed side effects profile. She is noted to be on two antipsychotics ( Prolixin, Seroquel)- she reports she has been on these in combination for several months prior to admission with good tolerance and " I did a lot better when I was taking both together". We reviewed risk of movement disorder including TD, metabolic and weight gain risks, potential sedation. Behavior on unit calm and in good control. Pleasant on approach. Visible in day room.   Cognitive Features That Contribute To Risk:  No gross cognitive deficits noted upon discharge. Is alert , attentive, and oriented x 3    Suicide Risk:  Mild:  Suicidal ideation of limited frequency, intensity, duration, and specificity.  There are no identifiable plans, no associated intent, mild dysphoria and related  symptoms, good self-control (both objective and subjective assessment), few other risk factors, and identifiable protective factors, including available and accessible social support.  [place], Teaching laboratory technician. Go on 01/08/2020.   Why: Your hospital discharge appointment is scheduled for 01/12 at 8:30. Please bring your discharge paperwork and photo ID with you. Wear a mask! Your appointment originally scheduled for 01/11 will be rescheduled after your hospital discharge appointment. Contact information: [place] [address] [place]. Schedule an appointment as soon as possible for a visit on 01/08/2020.   Why: Social work Biochemist, clinical attempted to schedule therapy appointments on your behalf with [name]. Please call to follow up and schedule an appointment within 3-5 days of discharge.  Contact information: [address] [place], [place] [address] P [phone] F [phone]          Plan Of Care/Follow-up recommendations:  Activity:  as tolerated  Diet:  regular Tests:  NA Other:  See below  Patient is expressing readiness for discharge and is leaving unit in good spirits. Plans to return home. States mother now has a home health aide which is a source of relief for patient as mother's care will now be taken over in part by [facility].  Follow up as above. Has an established PCP , Dr. Carey Bullocks in [place], for medical management as needed.  Jenne Campus, MD 01/07/2020, 10:14 AM

## 2020-01-07 NOTE — Progress Notes (Signed)
Discharge note:  D:  Pt verbalized readiness for discharge and denied SI/HI/AVH.   A: Discharge instructions reviewed with patient, belongings returned, and prescriptions given as applicable.    R: Pt  verbalized understanding of d/c instructions and stated her intent to be compliant with them.  Pt discharged to lobby without incident.

## 2020-01-07 NOTE — Progress Notes (Signed)
  [facility] Adult Case Management Discharge Plan :  Will you be returning to the same living situation after discharge:  Yes,  home with parents At discharge, do you have transportation home?: Yes,  pt's stepfather Do you have the ability to pay for your medications: Yes,  [employer] medicare  Release of information consent forms completed and in the chart;  Patient's signature needed at discharge.  Patient to Follow up at: [place], [facility]. Go on 01/08/2020.   Why: Your hospital discharge appointment is scheduled for 01/12 at 8:30. Please bring your discharge paperwork and photo ID with you. Wear a mask! Your appointment originally scheduled for 01/11 will be rescheduled after your hospital discharge appointment. Contact information: [place] [address] [place]. Schedule an appointment as soon as possible for a visit on 01/08/2020.   Why: Social work Biochemist, clinical attempted to schedule therapy appointments on your behalf with [name]. Please call to follow up and schedule an appointment within 3-5 days of discharge.  Contact information: [address] [place], [place] [address] P [phone] F [phone]          Next level of care provider has access to [place] and Suicide Prevention discussed: Yes,  pt's mother  Have you used any form of tobacco in the last 30 days? (Cigarettes, Smokeless Tobacco, Cigars, and/or Pipes): No  Has patient been referred to the Quitline?: N/A patient is not a smoker  Patient has been referred for addiction treatment: Yes  Trecia Rogers, LCSW 01/07/2020, 10:20 AM
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
## 2020-01-08 DIAGNOSIS — R69 Illness, unspecified: Secondary | ICD-10-CM | POA: Diagnosis not present

## 2020-01-08 DIAGNOSIS — J029 Acute pharyngitis, unspecified: Secondary | ICD-10-CM | POA: Diagnosis not present

## 2020-01-08 DIAGNOSIS — Z20828 Contact with and (suspected) exposure to other viral communicable diseases: Secondary | ICD-10-CM | POA: Diagnosis not present

## 2020-01-08 DIAGNOSIS — R509 Fever, unspecified: Secondary | ICD-10-CM | POA: Diagnosis not present

## 2020-01-08 DIAGNOSIS — R519 Headache, unspecified: Secondary | ICD-10-CM | POA: Diagnosis not present

## 2020-01-08 DIAGNOSIS — M791 Myalgia, unspecified site: Secondary | ICD-10-CM | POA: Diagnosis not present

## 2020-01-09 DIAGNOSIS — J029 Acute pharyngitis, unspecified: Secondary | ICD-10-CM | POA: Diagnosis not present

## 2020-01-09 DIAGNOSIS — R509 Fever, unspecified: Secondary | ICD-10-CM | POA: Diagnosis not present

## 2020-01-09 DIAGNOSIS — Z20822 Contact with and (suspected) exposure to covid-19: Secondary | ICD-10-CM | POA: Diagnosis not present

## 2020-01-09 DIAGNOSIS — R69 Illness, unspecified: Secondary | ICD-10-CM | POA: Diagnosis not present

## 2020-01-14 DIAGNOSIS — R69 Illness, unspecified: Secondary | ICD-10-CM | POA: Diagnosis not present

## 2020-01-15 DIAGNOSIS — Z751 Person awaiting admission to adequate facility elsewhere: Secondary | ICD-10-CM | POA: Diagnosis not present

## 2020-01-15 DIAGNOSIS — Z79899 Other long term (current) drug therapy: Secondary | ICD-10-CM | POA: Diagnosis not present

## 2020-01-15 DIAGNOSIS — Z86711 Personal history of pulmonary embolism: Secondary | ICD-10-CM | POA: Diagnosis not present

## 2020-01-15 DIAGNOSIS — I10 Essential (primary) hypertension: Secondary | ICD-10-CM | POA: Diagnosis not present

## 2020-01-15 DIAGNOSIS — E7800 Pure hypercholesterolemia, unspecified: Secondary | ICD-10-CM | POA: Diagnosis not present

## 2020-01-15 DIAGNOSIS — Z6835 Body mass index (BMI) 35.0-35.9, adult: Secondary | ICD-10-CM | POA: Diagnosis not present

## 2020-01-15 DIAGNOSIS — E119 Type 2 diabetes mellitus without complications: Secondary | ICD-10-CM | POA: Diagnosis not present

## 2020-01-15 DIAGNOSIS — M7662 Achilles tendinitis, left leg: Secondary | ICD-10-CM | POA: Diagnosis not present

## 2020-01-15 DIAGNOSIS — R69 Illness, unspecified: Secondary | ICD-10-CM | POA: Diagnosis not present

## 2020-01-15 DIAGNOSIS — K219 Gastro-esophageal reflux disease without esophagitis: Secondary | ICD-10-CM | POA: Diagnosis not present

## 2020-01-15 DIAGNOSIS — F418 Other specified anxiety disorders: Secondary | ICD-10-CM | POA: Diagnosis not present

## 2020-01-15 DIAGNOSIS — Z20822 Contact with and (suspected) exposure to covid-19: Secondary | ICD-10-CM | POA: Diagnosis not present

## 2020-01-16 DIAGNOSIS — Z9884 Bariatric surgery status: Secondary | ICD-10-CM | POA: Diagnosis not present

## 2020-01-16 DIAGNOSIS — K219 Gastro-esophageal reflux disease without esophagitis: Secondary | ICD-10-CM | POA: Diagnosis not present

## 2020-01-16 DIAGNOSIS — F603 Borderline personality disorder: Secondary | ICD-10-CM | POA: Diagnosis not present

## 2020-01-16 DIAGNOSIS — R69 Illness, unspecified: Secondary | ICD-10-CM | POA: Diagnosis not present

## 2020-01-16 DIAGNOSIS — E669 Obesity, unspecified: Secondary | ICD-10-CM | POA: Diagnosis not present

## 2020-01-16 DIAGNOSIS — Z6836 Body mass index (BMI) 36.0-36.9, adult: Secondary | ICD-10-CM | POA: Diagnosis not present

## 2020-01-16 DIAGNOSIS — I10 Essential (primary) hypertension: Secondary | ICD-10-CM | POA: Diagnosis not present

## 2020-01-16 DIAGNOSIS — G2581 Restless legs syndrome: Secondary | ICD-10-CM | POA: Diagnosis not present

## 2020-01-16 DIAGNOSIS — F411 Generalized anxiety disorder: Secondary | ICD-10-CM | POA: Diagnosis not present

## 2020-01-16 DIAGNOSIS — F419 Anxiety disorder, unspecified: Secondary | ICD-10-CM | POA: Diagnosis not present

## 2020-01-28 DIAGNOSIS — R69 Illness, unspecified: Secondary | ICD-10-CM | POA: Diagnosis not present

## 2020-01-29 DIAGNOSIS — F23 Brief psychotic disorder: Secondary | ICD-10-CM | POA: Diagnosis not present

## 2020-01-29 DIAGNOSIS — Z79899 Other long term (current) drug therapy: Secondary | ICD-10-CM | POA: Diagnosis not present

## 2020-01-29 DIAGNOSIS — F200 Paranoid schizophrenia: Secondary | ICD-10-CM | POA: Diagnosis not present

## 2020-01-29 DIAGNOSIS — R69 Illness, unspecified: Secondary | ICD-10-CM | POA: Diagnosis not present

## 2020-01-29 DIAGNOSIS — R45851 Suicidal ideations: Secondary | ICD-10-CM | POA: Diagnosis not present

## 2020-01-30 DIAGNOSIS — R69 Illness, unspecified: Secondary | ICD-10-CM | POA: Diagnosis not present

## 2020-02-05 DIAGNOSIS — R69 Illness, unspecified: Secondary | ICD-10-CM | POA: Diagnosis not present

## 2020-02-11 DIAGNOSIS — R69 Illness, unspecified: Secondary | ICD-10-CM | POA: Diagnosis not present

## 2020-02-25 DIAGNOSIS — R109 Unspecified abdominal pain: Secondary | ICD-10-CM | POA: Diagnosis not present

## 2020-02-25 DIAGNOSIS — R1084 Generalized abdominal pain: Secondary | ICD-10-CM | POA: Diagnosis not present

## 2020-02-25 DIAGNOSIS — N200 Calculus of kidney: Secondary | ICD-10-CM | POA: Diagnosis not present

## 2020-02-28 DIAGNOSIS — R69 Illness, unspecified: Secondary | ICD-10-CM | POA: Diagnosis not present

## 2020-03-04 DIAGNOSIS — R69 Illness, unspecified: Secondary | ICD-10-CM | POA: Diagnosis not present

## 2020-03-05 ENCOUNTER — Emergency Department (HOSPITAL_COMMUNITY)
Admission: EM | Admit: 2020-03-05 | Discharge: 2020-03-05 | Disposition: A | Discharge status: Home / Self Care | Payer: Medicare HMO | Attending: Emergency Medicine | Admitting: Emergency Medicine

## 2020-03-05 ENCOUNTER — Encounter (HOSPITAL_COMMUNITY): Payer: Self-pay

## 2020-03-05 ENCOUNTER — Other Ambulatory Visit: Payer: Self-pay

## 2020-03-05 ENCOUNTER — Emergency Department (HOSPITAL_COMMUNITY): Payer: Medicare HMO

## 2020-03-05 DIAGNOSIS — M545 Low back pain, unspecified: Secondary | ICD-10-CM

## 2020-03-05 DIAGNOSIS — R69 Illness, unspecified: Secondary | ICD-10-CM | POA: Diagnosis not present

## 2020-03-05 DIAGNOSIS — M546 Pain in thoracic spine: Secondary | ICD-10-CM | POA: Diagnosis not present

## 2020-03-05 DIAGNOSIS — F17200 Nicotine dependence, unspecified, uncomplicated: Secondary | ICD-10-CM | POA: Diagnosis not present

## 2020-03-05 DIAGNOSIS — R32 Unspecified urinary incontinence: Secondary | ICD-10-CM | POA: Diagnosis not present

## 2020-03-05 LAB — URINALYSIS, ROUTINE W REFLEX MICROSCOPIC
Bilirubin Urine: NEGATIVE
Glucose, UA: NEGATIVE mg/dL
Hgb urine dipstick: NEGATIVE
Ketones, ur: NEGATIVE mg/dL
Leukocytes,Ua: NEGATIVE
Nitrite: NEGATIVE
Protein, ur: NEGATIVE mg/dL
Specific Gravity, Urine: 1.018 (ref 1.005–1.030)
pH: 5 (ref 5.0–8.0)

## 2020-03-05 MED ORDER — OXYCODONE-ACETAMINOPHEN 5-325 MG PO TABS
1.0000 | ORAL_TABLET | Freq: Once | ORAL | Status: AC
  Administered 2020-03-05: 1 via ORAL
  Filled 2020-03-05: qty 1

## 2020-03-05 MED ORDER — OXYCODONE-ACETAMINOPHEN 5-325 MG PO TABS: 1.0000 | ORAL_TABLET | ORAL | 0 refills | Status: DC | PRN

## 2020-03-05 NOTE — ED Notes (Signed)
Bladder scan showed 0mL.  

## 2020-03-05 NOTE — ED Triage Notes (Signed)
Pt reports lower back pain mainly on the left and urinary incontinence x2 weeks. Pt reports going to PCP and they prescribed her prednisone. Pt reports she took the last dose on Sunday and the pain started back after that.

## 2020-03-05 NOTE — Discharge Instructions (Signed)
There were no signs of serious problems, associated with your lower back pain.  We are prescribing narcotic pain reliever to use as needed for symptom control.  Do not drive or operate machinery while using the narcotic pain reliever.  Also try using heat on sore area 3-4 times a day and resting.  You can do some gentle stretching, as tolerated to improve her mobility.  If you do not get better in a week or so check with your doctor who may want to order physical therapy to improve your discomfort.
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
## 2020-05-19 ENCOUNTER — Other Ambulatory Visit: Payer: Self-pay | Admitting: Behavioral Health

## 2020-05-19 ENCOUNTER — Inpatient Hospital Stay (HOSPITAL_COMMUNITY)
Admission: AD | Admit: 2020-05-19 | Discharge: 2020-05-23 | DRG: 885 | Disposition: A | Discharge status: Home / Self Care | Payer: Medicare Other | Source: Other Acute Inpatient Hospital | Attending: Psychiatry | Admitting: Psychiatry

## 2020-05-19 DIAGNOSIS — Z87442 Personal history of urinary calculi: Secondary | ICD-10-CM

## 2020-05-19 DIAGNOSIS — Z8249 Family history of ischemic heart disease and other diseases of the circulatory system: Secondary | ICD-10-CM | POA: Diagnosis not present

## 2020-05-19 DIAGNOSIS — F200 Paranoid schizophrenia: Principal | ICD-10-CM | POA: Diagnosis present

## 2020-05-19 DIAGNOSIS — F17200 Nicotine dependence, unspecified, uncomplicated: Secondary | ICD-10-CM | POA: Diagnosis present

## 2020-05-19 DIAGNOSIS — F209 Schizophrenia, unspecified: Secondary | ICD-10-CM | POA: Diagnosis present

## 2020-05-19 DIAGNOSIS — E785 Hyperlipidemia, unspecified: Secondary | ICD-10-CM | POA: Diagnosis present

## 2020-05-19 DIAGNOSIS — Z818 Family history of other mental and behavioral disorders: Secondary | ICD-10-CM

## 2020-05-19 DIAGNOSIS — Z791 Long term (current) use of non-steroidal anti-inflammatories (NSAID): Secondary | ICD-10-CM

## 2020-05-19 DIAGNOSIS — R45851 Suicidal ideations: Secondary | ICD-10-CM | POA: Diagnosis present

## 2020-05-19 DIAGNOSIS — Z915 Personal history of self-harm: Secondary | ICD-10-CM

## 2020-05-19 DIAGNOSIS — G2581 Restless legs syndrome: Secondary | ICD-10-CM | POA: Diagnosis present

## 2020-05-19 DIAGNOSIS — K219 Gastro-esophageal reflux disease without esophagitis: Secondary | ICD-10-CM | POA: Diagnosis present

## 2020-05-19 DIAGNOSIS — Z86711 Personal history of pulmonary embolism: Secondary | ICD-10-CM

## 2020-05-19 DIAGNOSIS — I10 Essential (primary) hypertension: Secondary | ICD-10-CM | POA: Diagnosis present

## 2020-05-19 DIAGNOSIS — F250 Schizoaffective disorder, bipolar type: Secondary | ICD-10-CM | POA: Diagnosis not present

## 2020-05-19 DIAGNOSIS — Z79899 Other long term (current) drug therapy: Secondary | ICD-10-CM | POA: Diagnosis not present

## 2020-05-19 DIAGNOSIS — G4700 Insomnia, unspecified: Secondary | ICD-10-CM | POA: Diagnosis present

## 2020-05-19 DIAGNOSIS — Z85828 Personal history of other malignant neoplasm of skin: Secondary | ICD-10-CM | POA: Diagnosis not present

## 2020-05-19 DIAGNOSIS — Z9851 Tubal ligation status: Secondary | ICD-10-CM

## 2020-05-19 DIAGNOSIS — F251 Schizoaffective disorder, depressive type: Secondary | ICD-10-CM | POA: Diagnosis present

## 2020-05-19 DIAGNOSIS — F411 Generalized anxiety disorder: Secondary | ICD-10-CM | POA: Diagnosis present

## 2020-05-19 DIAGNOSIS — F4310 Post-traumatic stress disorder, unspecified: Secondary | ICD-10-CM | POA: Diagnosis present

## 2020-05-19 DIAGNOSIS — F603 Borderline personality disorder: Secondary | ICD-10-CM | POA: Diagnosis present

## 2020-05-20 ENCOUNTER — Encounter (HOSPITAL_COMMUNITY): Payer: Self-pay | Admitting: Behavioral Health

## 2020-05-20 ENCOUNTER — Other Ambulatory Visit: Payer: Self-pay

## 2020-05-20 DIAGNOSIS — F250 Schizoaffective disorder, bipolar type: Secondary | ICD-10-CM

## 2020-05-20 LAB — TSH: TSH: 1.032 u[IU]/mL (ref 0.350–4.500)

## 2020-05-20 LAB — LIPID PANEL
Cholesterol: 208 mg/dL — ABNORMAL HIGH (ref 0–200)
HDL: 54 mg/dL (ref >40)
LDL Cholesterol: 139 mg/dL — ABNORMAL HIGH (ref 0–99)
Total CHOL/HDL Ratio: 3.9 RATIO
Triglycerides: 76 mg/dL (ref <150)
VLDL: 15 mg/dL (ref 0–40)

## 2020-05-20 LAB — HEMOGLOBIN A1C
Hgb A1c MFr Bld: 6 % — ABNORMAL HIGH (ref 4.8–5.6)
Mean Plasma Glucose: 125.5 mg/dL

## 2020-05-20 MED ORDER — OLANZAPINE 10 MG PO TABS
10.0000 mg | ORAL_TABLET | Freq: Two times a day (BID) | ORAL | Status: DC
  Administered 2020-05-20 – 2020-05-22 (×4): 10 mg via ORAL
  Filled 2020-05-20 (×6): qty 1

## 2020-05-20 MED ORDER — PRAZOSIN HCL 2 MG PO CAPS
2.0000 mg | ORAL_CAPSULE | Freq: Every day | ORAL | Status: DC
  Administered 2020-05-20 – 2020-05-22 (×3): 2 mg via ORAL
  Filled 2020-05-20 (×5): qty 1

## 2020-05-20 MED ORDER — MAGNESIUM HYDROXIDE 400 MG/5ML PO SUSP: 30.0000 mL | Freq: Every day | ORAL | Status: DC | PRN

## 2020-05-20 MED ORDER — HYDROXYZINE HCL 25 MG PO TABS
25.0000 mg | ORAL_TABLET | Freq: Four times a day (QID) | ORAL | Status: DC | PRN
  Administered 2020-05-20 – 2020-05-22 (×3): 25 mg via ORAL
  Filled 2020-05-20 (×3): qty 1

## 2020-05-20 MED ORDER — ACETAMINOPHEN 325 MG PO TABS: 650.0000 mg | ORAL_TABLET | Freq: Four times a day (QID) | ORAL | Status: DC | PRN

## 2020-05-20 MED ORDER — BUSPIRONE HCL 10 MG PO TABS: 10.0000 mg | ORAL_TABLET | Freq: Three times a day (TID) | ORAL | Status: DC

## 2020-05-20 MED ORDER — FLUPHENAZINE HCL 5 MG PO TABS
5.0000 mg | ORAL_TABLET | Freq: Two times a day (BID) | ORAL | Status: DC
  Administered 2020-05-20: 5 mg via ORAL
  Filled 2020-05-20 (×4): qty 1

## 2020-05-20 MED ORDER — BENZTROPINE MESYLATE 0.5 MG PO TABS
0.5000 mg | ORAL_TABLET | Freq: Two times a day (BID) | ORAL | Status: DC
  Administered 2020-05-20: 0.5 mg via ORAL
  Filled 2020-05-20 (×3): qty 1

## 2020-05-20 MED ORDER — FLUPHENAZINE HCL 5 MG PO TABS
5.0000 mg | ORAL_TABLET | Freq: Every day | ORAL | Status: DC
  Administered 2020-05-21: 5 mg via ORAL
  Filled 2020-05-20 (×2): qty 1

## 2020-05-20 MED ORDER — BUSPIRONE HCL 15 MG PO TABS
15.0000 mg | ORAL_TABLET | Freq: Three times a day (TID) | ORAL | Status: DC
  Administered 2020-05-20: 15 mg via ORAL
  Filled 2020-05-20 (×3): qty 1

## 2020-05-20 MED ORDER — OXCARBAZEPINE 300 MG PO TABS
300.0000 mg | ORAL_TABLET | Freq: Two times a day (BID) | ORAL | Status: DC
  Administered 2020-05-21 – 2020-05-23 (×5): 300 mg via ORAL
  Filled 2020-05-20 (×9): qty 1

## 2020-05-20 MED ORDER — OXCARBAZEPINE 300 MG PO TABS
300.0000 mg | ORAL_TABLET | Freq: Three times a day (TID) | ORAL | Status: DC
  Administered 2020-05-20: 300 mg via ORAL
  Filled 2020-05-20 (×3): qty 1

## 2020-05-20 MED ORDER — PRAZOSIN HCL 1 MG PO CAPS
3.0000 mg | ORAL_CAPSULE | Freq: Every day | ORAL | Status: DC
  Filled 2020-05-20 (×2): qty 3

## 2020-05-20 MED ORDER — MELOXICAM 15 MG PO TABS
15.0000 mg | ORAL_TABLET | Freq: Every day | ORAL | Status: DC
  Administered 2020-05-20 – 2020-05-23 (×4): 15 mg via ORAL
  Filled 2020-05-20 (×6): qty 1

## 2020-05-20 MED ORDER — PRAZOSIN HCL 1 MG PO CAPS
1.0000 mg | ORAL_CAPSULE | Freq: Every day | ORAL | Status: DC
  Filled 2020-05-20: qty 1

## 2020-05-20 MED ORDER — FLUPHENAZINE HCL 5 MG PO TABS
5.0000 mg | ORAL_TABLET | Freq: Every day | ORAL | Status: DC
  Filled 2020-05-20: qty 1

## 2020-05-20 MED ORDER — BUSPIRONE HCL 15 MG PO TABS
15.0000 mg | ORAL_TABLET | Freq: Two times a day (BID) | ORAL | Status: DC
  Administered 2020-05-20 – 2020-05-23 (×6): 15 mg via ORAL
  Filled 2020-05-20 (×10): qty 1

## 2020-05-20 MED ORDER — ALUM & MAG HYDROXIDE-SIMETH 200-200-20 MG/5ML PO SUSP: 30.0000 mL | ORAL | Status: DC | PRN

## 2020-05-20 MED ORDER — BENZTROPINE MESYLATE 1 MG PO TABS
1.0000 mg | ORAL_TABLET | Freq: Two times a day (BID) | ORAL | Status: DC
  Administered 2020-05-20 – 2020-05-23 (×6): 1 mg via ORAL
  Filled 2020-05-20 (×8): qty 1

## 2020-05-20 MED ORDER — ROPINIROLE HCL 1 MG PO TABS
4.0000 mg | ORAL_TABLET | Freq: Every day | ORAL | Status: DC
  Administered 2020-05-20 – 2020-05-22 (×3): 4 mg via ORAL
  Filled 2020-05-20 (×5): qty 4

## 2020-05-20 MED ORDER — OLANZAPINE 10 MG PO TABS
10.0000 mg | ORAL_TABLET | Freq: Two times a day (BID) | ORAL | Status: DC
  Administered 2020-05-20: 10 mg via ORAL
  Filled 2020-05-20 (×3): qty 1

## 2020-05-20 MED ORDER — PANTOPRAZOLE SODIUM 20 MG PO TBEC
20.0000 mg | DELAYED_RELEASE_TABLET | Freq: Every day | ORAL | Status: DC
  Administered 2020-05-20 – 2020-05-23 (×4): 20 mg via ORAL
  Filled 2020-05-20 (×6): qty 1

## 2020-05-20 MED ORDER — QUETIAPINE FUMARATE 200 MG PO TABS
200.0000 mg | ORAL_TABLET | Freq: Every day | ORAL | Status: DC
  Administered 2020-05-20: 200 mg via ORAL
  Filled 2020-05-20 (×3): qty 1

## 2020-05-20 NOTE — [facility] Suicide Risk Assessment (Signed)
[place] INPATIENT:  Family/Significant Other Suicide Prevention Education  Suicide Prevention Education:  Education Attempted with Mother- Gearldine Shown [phone]). CSW left a HIPAA compliant voicemail requesting a call back.    Darletta Moll MSW, [place] Worker  [facility]
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
## 2020-05-20 NOTE — Progress Notes (Deleted)
Pt coming out his room, pt stated he fell. Pt stated he hit his R-elbow, R-knee. Pt stated he did not hit his head. Pt stated he woke up and ran into the door . Vitals: 148/98 , P-84, T 98.6, RR-18

## 2020-05-20 NOTE — Progress Notes (Signed)
Pt stated she was doing little better, pt visible in the dayroom much of this evening    05/20/20 2000  Psych Admission Type (Psych Patients Only)  Admission Status Involuntary  Psychosocial Assessment  Patient Complaints Anxiety;Depression  Eye Contact Brief  Facial Expression Anxious;Sad  Affect Anxious;Depressed;Sad  Speech Soft  Interaction Minimal  Motor Activity Slow  Appearance/Hygiene Unremarkable  Behavior Characteristics Anxious  Mood Anxious  Thought Process  Coherency WDL  Content WDL  Delusions None reported or observed  Perception WDL  Hallucination None reported or observed  Judgment WDL  Confusion WDL  Danger to Self  Current suicidal ideation? Denies  Danger to Others  Danger to Others None reported or observed
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
## 2020-05-20 NOTE — [facility] Suicide Risk Assessment (Signed)
[place] INPATIENT:  Family/Significant Other Suicide Prevention Education  Suicide Prevention Education: Education Completed; Mother, [name] [name] [id number]), has been identified by the patient as the family member/significant other with whom the patient will be residing, and identified as the person(s) who will aid the patient in the event of a mental health crisis (suicidal ideations/suicide attempt).  With written consent from the patient, the family member/significant other has been provided the following suicide prevention education, prior to the and/or following the discharge of the patient.  The suicide prevention education provided includes the following:  Suicide risk factors  Suicide prevention and interventions  National Suicide Hotline telephone number  [facility] assessment telephone number  [facility] Emergency Assistance [place] and/or Residential Mobile Crisis Unit telephone number   Request made of family/significant other to:  Remove weapons (e.g., guns, rifles, knives), all items previously/currently identified as safety concern.    Remove drugs/medications (over-the-counter, prescriptions, illicit drugs), all items previously/currently identified as a safety concern.   The family member/significant other verbalizes understanding of the suicide prevention education information provided.  The family member/significant other agrees to remove the items of safety concern listed above.  Per [name] [name], pt is able to return home to continue to live with her once she is discharged from the hospital. Pt's mother stated that there are no weapons in the home and reports that she has no safety concerns for this pt once she is able to discharge.    [name] [name] MSW, [place] Worker  [facility]

## 2020-05-20 NOTE — Progress Notes (Signed)
Her goal for tomorrow is to talk to her doctor and CP.A. She verbalized that she called her eye doctor regarding an appointment.

## 2020-05-20 NOTE — Progress Notes (Signed)
   05/20/20 0845  Psych Admission Type (Psych Patients Only)  Admission Status Involuntary  Psychosocial Assessment  Patient Complaints Anxiety;Depression  Eye Contact Brief  Facial Expression Anxious;Sad  Affect Anxious;Depressed;Sad  Speech Soft  Interaction Minimal  Motor Activity Slow  Appearance/Hygiene Unremarkable  Behavior Characteristics Anxious  Mood Anxious;Sad;Depressed  Thought Process  Coherency WDL  Content WDL  Delusions None reported or observed  Perception WDL  Hallucination None reported or observed  Judgment WDL  Confusion WDL  Danger to Self  Current suicidal ideation? Denies  Danger to Others  Danger to Others None reported or observed

## 2020-05-20 NOTE — Tx Team (Signed)
Initial Treatment Plan 05/20/2020 2:24 AM [name] [name] [id number]    PATIENT STRESSORS: Marital or family conflict Medication change or noncompliance   PATIENT STRENGTHS: General fund of knowledge Motivation for treatment/growth   PATIENT IDENTIFIED PROBLEMS: Risk for suicide  psychosis  "get on meds right"                 DISCHARGE CRITERIA:  Improved stabilization in mood, thinking, and/or behavior Verbal commitment to aftercare and medication compliance  PRELIMINARY DISCHARGE PLAN: Attend aftercare/continuing care group Outpatient therapy  PATIENT/FAMILY INVOLVEMENT: This treatment plan has been presented to and reviewed with the patient, [name] [name].  The patient and family have been given the opportunity to ask questions and make suggestions.  Providence Crosby, RN 05/20/2020, 2:24 AM
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
## 2020-05-20 NOTE — Progress Notes (Signed)
Patient ID: [name] [name], female   DOB: 02/03/1971, 49 y.o.   MRN: [id number]  Admission Note:  49 yr female who presents [facility] in no acute distress for the treatment of SI / psychosis and Depression. Pt appears flat and depressed. Pt was calm and cooperative with admission process. Pt presents with passive SI AVH and contracts for safety upon admission. Pt stated her pharmacy was out of her Zyprexa and pt was without her medication for 5 days. Pt mother suggested she got to [facility] and they suggested she come to the ER.   A:Skin was assessed ([place]-RN) and found to be clear of any abnormal marks. PT searched and no contraband found, POC and unit policies explained and understanding verbalized. Consents obtained. Food and fluids offered, and  accepted.   R:Pt had no additional questions or concerns.

## 2020-05-21 LAB — BASIC METABOLIC PANEL
Anion gap: 9 (ref 5–15)
BUN: 18 mg/dL (ref 6–20)
CO2: 26 mmol/L (ref 22–32)
Calcium: 8.8 mg/dL — ABNORMAL LOW (ref 8.9–10.3)
Chloride: 105 mmol/L (ref 98–111)
Creatinine, Ser: 0.88 mg/dL (ref 0.44–1.00)
GFR calc Af Amer: >60 mL/min (ref >60)
GFR calc non Af Amer: >60 mL/min (ref >60)
Glucose, Bld: 97 mg/dL (ref 70–99)
Potassium: 3.9 mmol/L (ref 3.5–5.1)
Sodium: 140 mmol/L (ref 135–145)

## 2020-05-21 LAB — CBC WITH DIFFERENTIAL/PLATELET
Abs Immature Granulocytes: 0.02 10*3/uL (ref 0.00–0.07)
Basophils Absolute: 0.1 10*3/uL (ref 0.0–0.1)
Basophils Relative: 1 %
Eosinophils Absolute: 0.2 10*3/uL (ref 0.0–0.5)
Eosinophils Relative: 3 %
HCT: 37.8 % (ref 36.0–46.0)
Hemoglobin: 11.5 g/dL — ABNORMAL LOW (ref 12.0–15.0)
Immature Granulocytes: 0 %
Lymphocytes Relative: 39 %
Lymphs Abs: 1.9 10*3/uL (ref 0.7–4.0)
MCH: 26.1 pg (ref 26.0–34.0)
MCHC: 30.4 g/dL (ref 30.0–36.0)
MCV: 85.7 fL (ref 80.0–100.0)
Monocytes Absolute: 0.4 10*3/uL (ref 0.1–1.0)
Monocytes Relative: 8 %
Neutro Abs: 2.4 10*3/uL (ref 1.7–7.7)
Neutrophils Relative %: 49 %
Platelets: 341 10*3/uL (ref 150–400)
RBC: 4.41 MIL/uL (ref 3.87–5.11)
RDW: 15.1 % (ref 11.5–15.5)
WBC: 5 10*3/uL (ref 4.0–10.5)
nRBC: 0 % (ref 0.0–0.2)

## 2020-05-21 LAB — LIPID PANEL
Cholesterol: 209 mg/dL — ABNORMAL HIGH (ref 0–200)
HDL: 54 mg/dL (ref >40)
LDL Cholesterol: 137 mg/dL — ABNORMAL HIGH (ref 0–99)
Total CHOL/HDL Ratio: 3.9 RATIO
Triglycerides: 89 mg/dL (ref <150)
VLDL: 18 mg/dL (ref 0–40)

## 2020-05-21 LAB — HEMOGLOBIN A1C
Hgb A1c MFr Bld: 5.9 % — ABNORMAL HIGH (ref 4.8–5.6)
Mean Plasma Glucose: 122.63 mg/dL

## 2020-05-21 LAB — TSH: TSH: 1.479 u[IU]/mL (ref 0.350–4.500)

## 2020-05-21 LAB — PROLACTIN: Prolactin: 36.6 ng/mL — ABNORMAL HIGH (ref 4.8–23.3)

## 2020-05-21 MED ORDER — QUETIAPINE FUMARATE 400 MG PO TABS
400.0000 mg | ORAL_TABLET | Freq: Every day | ORAL | Status: DC
  Administered 2020-05-21 – 2020-05-22 (×2): 400 mg via ORAL
  Filled 2020-05-21 (×4): qty 1

## 2020-05-21 MED ORDER — GABAPENTIN 100 MG PO CAPS
200.0000 mg | ORAL_CAPSULE | Freq: Every day | ORAL | Status: DC
  Administered 2020-05-21: 200 mg via ORAL
  Filled 2020-05-21 (×2): qty 2
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
## 2020-05-21 NOTE — Progress Notes (Signed)
Pt stated she was doing better. Pt appeared to be in better spirits. Pt had appropriate conversation with Probation officer and appeared very sincere    05/21/20 2100  Psych Admission Type (Psych Patients Only)  Admission Status Involuntary  Psychosocial Assessment  Patient Complaints Anxiety  Eye Contact Brief  Facial Expression Anxious;Sad  Affect Anxious;Depressed;Sad  Speech Soft  Interaction Minimal  Motor Activity Slow  Appearance/Hygiene Unremarkable  Behavior Characteristics Anxious  Mood Depressed  Thought Process  Coherency WDL  Content WDL  Delusions None reported or observed  Perception WDL  Hallucination None reported or observed  Judgment WDL  Confusion WDL  Danger to Self  Current suicidal ideation? Denies  Danger to Others  Danger to Others None reported or observed

## 2020-05-21 NOTE — Tx Team (Signed)
Interdisciplinary Treatment and Diagnostic Plan Update  05/21/2020 Time of Session: 9:00am [name] [name] MRN: [id number]  Principal Diagnosis: <principal problem not specified>  Secondary Diagnoses: Active Problems:   Schizophrenia ([place])   Current Medications:  Current Facility-Administered Medications  Medication Dose Route Frequency Provider Last Rate Last Admin  . acetaminophen (TYLENOL) tablet 650 mg  650 mg Oral Q6H PRN Connye Burkitt, NP      . alum & mag hydroxide-simeth (MAALOX/MYLANTA) 200-200-20 MG/5ML suspension 30 mL  30 mL Oral Q4H PRN Connye Burkitt, NP      . benztropine (COGENTIN) tablet 1 mg  1 mg Oral BID Connye Burkitt, NP   1 mg at 05/21/20 0807  . busPIRone (BUSPAR) tablet 15 mg  15 mg Oral BID Connye Burkitt, NP   15 mg at 05/21/20 0806  . fluPHENAZine (PROLIXIN) tablet 5 mg  5 mg Oral Daily Cobos, Myer Peer, MD   5 mg at 05/21/20 0808  . hydrOXYzine (ATARAX/VISTARIL) tablet 25 mg  25 mg Oral Q6H PRN Connye Burkitt, NP   25 mg at 05/20/20 2233  . magnesium hydroxide (MILK OF MAGNESIA) suspension 30 mL  30 mL Oral Daily PRN Connye Burkitt, NP      . meloxicam (MOBIC) tablet 15 mg  15 mg Oral Daily Connye Burkitt, NP   15 mg at 05/21/20 0810  . OLANZapine (ZYPREXA) tablet 10 mg  10 mg Oral BID Connye Burkitt, NP   10 mg at 05/21/20 0806  . Oxcarbazepine (TRILEPTAL) tablet 300 mg  300 mg Oral BID Connye Burkitt, NP   300 mg at 05/21/20 0806  . pantoprazole (PROTONIX) EC tablet 20 mg  20 mg Oral Daily Connye Burkitt, NP   20 mg at 05/21/20 [id number]  . prazosin (MINIPRESS) capsule 2 mg  2 mg Oral QHS Cobos, Myer Peer, MD   2 mg at 05/20/20 2048  . QUEtiapine (SEROQUEL) tablet 200 mg  200 mg Oral QHS Connye Burkitt, NP   200 mg at 05/20/20 2048  . rOPINIRole (REQUIP) tablet 4 mg  4 mg Oral QHS Connye Burkitt, NP   4 mg at 05/20/20 2048   PTA Medications: Medications Prior to Admission  Medication Sig Dispense Refill Last Dose  . busPIRone (BUSPAR) 10 MG tablet Take  10 mg by mouth 2 (two) times daily.     . Oxcarbazepine (TRILEPTAL) 300 MG tablet Take 1 tablet by mouth in the morning and at bedtime.     . benztropine (COGENTIN) 0.5 MG tablet Take 1 tablet (0.5 mg total) by mouth 2 (two) times daily. 60 tablet 0   . EPINEPHrine 0.3 mg/0.3 mL IJ SOAJ injection Inject 0.3 mg into the skin as needed.     . fluPHENAZine (PROLIXIN) 5 MG tablet Take 1 tablet (5 mg total) by mouth at bedtime. 30 tablet 0   . hydrOXYzine (ATARAX/VISTARIL) 25 MG tablet Take 1 tablet (25 mg total) by mouth 3 (three) times daily as needed for anxiety. (Patient taking differently: Take 50 mg by mouth 3 (three) times daily as needed for anxiety. ) 30 tablet 0   . meloxicam (MOBIC) 15 MG tablet Take 1 tablet (15 mg total) by mouth daily. 30 tablet 1   . OLANZapine zydis (ZYPREXA) 10 MG disintegrating tablet Take 1 tablet by mouth in the morning and at bedtime.     . prazosin (MINIPRESS) 1 MG capsule Take 1 capsule (1 mg total) by mouth at  bedtime. 30 capsule 0   . QUEtiapine (SEROQUEL XR) 400 MG 24 hr tablet Take 1 tablet by mouth at bedtime.     [name] rOPINIRole (REQUIP) 4 MG tablet Take 4 mg by mouth at bedtime.       Patient Stressors: Marital or family conflict Medication change or noncompliance  Patient Strengths: Technical sales engineer for treatment/growth  Treatment Modalities: Medication Management, Group therapy, Case management,  1 to 1 session with clinician, Psychoeducation, Recreational therapy.   Physician Treatment Plan for Primary Diagnosis: <principal problem not specified> Long Term Goal(s): Improvement in symptoms so as ready for discharge Improvement in symptoms so as ready for discharge   Short Term Goals: Ability to identify changes in lifestyle to reduce recurrence of condition will improve Ability to verbalize feelings will improve Ability to disclose and discuss suicidal ideas Ability to demonstrate self-control will improve Ability to identify  and develop effective coping behaviors will improve  Medication Management: Evaluate patient's response, side effects, and tolerance of medication regimen.  Therapeutic Interventions: 1 to 1 sessions, Unit Group sessions and Medication administration.  Evaluation of Outcomes: Progressing  Physician Treatment Plan for Secondary Diagnosis: Active Problems:   Schizophrenia ([place])  Long Term Goal(s): Improvement in symptoms so as ready for discharge Improvement in symptoms so as ready for discharge   Short Term Goals: Ability to identify changes in lifestyle to reduce recurrence of condition will improve Ability to verbalize feelings will improve Ability to disclose and discuss suicidal ideas Ability to demonstrate self-control will improve Ability to identify and develop effective coping behaviors will improve     Medication Management: Evaluate patient's response, side effects, and tolerance of medication regimen.  Therapeutic Interventions: 1 to 1 sessions, Unit Group sessions and Medication administration.  Evaluation of Outcomes: Progressing   RN Treatment Plan for Primary Diagnosis: <principal problem not specified> Long Term Goal(s): Knowledge of disease and therapeutic regimen to maintain health will improve  Short Term Goals: Ability to demonstrate self-control, Ability to identify and develop effective coping behaviors will improve and Compliance with prescribed medications will improve  Medication Management: RN will administer medications as ordered by provider, will assess and evaluate patient's response and provide education to patient for prescribed medication. RN will report any adverse and/or side effects to prescribing provider.  Therapeutic Interventions: 1 on 1 counseling sessions, Psychoeducation, Medication administration, Evaluate responses to treatment, Monitor vital signs and CBGs as ordered, Perform/monitor CIWA, COWS, AIMS and Fall Risk screenings as ordered,  Perform wound care treatments as ordered.  Evaluation of Outcomes: Progressing   LCSW Treatment Plan for Primary Diagnosis: <principal problem not specified> Long Term Goal(s): Safe transition to appropriate next level of care at discharge, Engage patient in therapeutic group addressing interpersonal concerns.  Short Term Goals: Engage patient in aftercare planning with referrals and resources, Increase social support, Increase emotional regulation, Identify triggers associated with mental health/substance abuse issues and Increase skills for wellness and recovery  Therapeutic Interventions: Assess for all discharge needs, 1 to 1 time with Social worker, Explore available resources and support systems, Assess for adequacy in community support network, Educate family and significant other(s) on suicide prevention, Complete Psychosocial Assessment, Interpersonal group therapy.  Evaluation of Outcomes: Progressing  Progress in Treatment:  Attending groups: Yes. Participating in groups: Yes. Taking medication as prescribed: Yes. Toleration medication: Yes. Family/Significant other contact made: Yes, individual(s) contacted:  mother. Patient understands diagnosis: Yes. Discussing patient identified problems/goals with staff: Yes. Medical problems stabilized or resolved: No. and  As evidenced by:  difficulty sleeping. Denies suicidal/homicidal ideation: Yes. Issues/concerns per patient self-inventory: Yes.  New problem(s) identified: Yes, Describe:  caregiving responsibilities  New Short Term/Long Term Goal(s): medication management for mood stabilization; elimination of SI thoughts; development of comprehensive mental wellness/sobriety plan.  Patient Goals: "Get my meds right."  Discharge Plan or Barriers: Patient will return home with family and continue with her current outpatient providers at [facility] and [facility].  Reason for Continuation of Hospitalization:  Anxiety Depression Medication stabilization Other; describe Insomnia  Estimated Length of Stay: 3-5 days  Attendees: Patient: [name] [name] 05/21/2020 9:37 AM  Physician: Dr. Mallie Darting 05/21/2020 9:37 AM  Nursing:  05/21/2020 9:37 AM  RN Care Manager: 05/21/2020 9:37 AM  Social Worker: Stephanie Acre, [place] 05/21/2020 9:37 AM  Recreational Therapist:  05/21/2020 9:37 AM  Other:  05/21/2020 9:37 AM  Other:  05/21/2020 9:37 AM  Other: 05/21/2020 9:37 AM    Scribe for Treatment Team: Joellen Jersey, [place] 05/21/2020 9:37 AM

## 2020-05-21 NOTE — Progress Notes (Signed)
Pt had to sleep in the quiet room due to her roommate crying and making noise through the night after coming from the hospital

## 2020-05-21 NOTE — Progress Notes (Signed)
Adult Psychoeducational Group Note  Date:  05/21/2020 Time:  10:43 PM  Group Topic/Focus:  Wrap-Up Group:   The focus of this group is to help patients review their daily goal of treatment and discuss progress on daily workbooks.  Participation Level:  Active  Participation Quality:  Appropriate  Affect:  Appropriate  Cognitive:  Appropriate  Insight: Appropriate  Engagement in Group:  Engaged  Modes of Intervention:  Education and Support  Additional Comments:  Patient attended and participated in group tonight. She reports having a good day. She meet with the treatment team today, talked to her mother and watch television.  Salley Scarlet [facility] 05/21/2020, 10:43 PM

## 2020-05-21 NOTE — Progress Notes (Signed)
   05/21/20 0627  Vital Signs  Pulse Rate 93  BP 110/70  BP Location Right Arm  BP Method Automatic  Patient Position (if appropriate) Standing  D:  Patient presented with an anxious affect. Patient rated anxiety 8/10 and depression 1/10. Patient denied SI/HI [place].  Patient was out in open areas. Patient took all of her medicine. A:Support and encouragement provided Routine safety checks conducted every 15 minutes. Patient  Informed to notify staff with any concerns.  Safety maintained.

## 2020-05-21 NOTE — Progress Notes (Signed)
Recreation Therapy Notes  INPATIENT RECREATION THERAPY ASSESSMENT  Patient Details Name: [name] [name] MRN: [id number] DOB: 12/22/1971 Today's Date: 05/21/2020       Information Obtained From: Patient  Able to Participate in Assessment/Interview: Yes  Patient Presentation: Alert  Reason for Admission (Per Patient): Other (Comments)(Pt stated she had trouble getting medicines and she's been off of them for a week.)  Patient Stressors: Family(Issues with daughter)  Coping Skills:   TV, Music, Exercise, Deep Breathing, Talk, Prayer, Avoidance, Read, Hot Bath/Shower  Leisure Interests (2+):  Community - Psychologist, occupational (Comment), Community - Other (Comment), Individual - Other (Comment)(Walking dog; Psychologist, occupational at [place])  Frequency of Recreation/Participation: Other (Comment)(Walk dog- Daily; [employer]/Church- Weekly)  Awareness of Community Resources:  Yes  Community Resources:  [place], [place], [employer]  Current Use: Yes  If no, Barriers?:    Expressed Interest in [place]: No  [place]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

## 2020-05-21 NOTE — Progress Notes (Signed)
Recreation Therapy Notes  Date: 5.26.21 Time: 1115 Location: [address] Dayroom  Group Topic:  Goal Setting  Goal Area(s) Addresses:  Patient will be able to identify four goals they wish to accomplish Patient will identify obstacles that would prevent reaching goals. Patient will identify what they need to achieve goals.  Intervention: Worksheet, pencils  Activity: Goal Planning.  Patients were to identify goals they want to accomplish in a week, month, year and five years.  Patients then identified obstacles they may face, what they will need to be successful and what they can start doing now to work towards goals.   Education:  Discharge Planning, Coping Skills, Goal Planning  Education Outcome: Acknowledges Education/In Group Clarification Provided/Needs Additional Education  Clinical Observations: Pt did not attend group.    Victorino Sparrow , LRT/CTRS         Ria Comment, Chelsi Warr A 05/21/2020 1:01 PM

## 2020-05-22 LAB — URINALYSIS, COMPLETE (UACMP) WITH MICROSCOPIC
Bacteria, UA: NONE SEEN
Bilirubin Urine: NEGATIVE
Glucose, UA: >=500 mg/dL — AB
Hgb urine dipstick: NEGATIVE
Ketones, ur: NEGATIVE mg/dL
Leukocytes,Ua: NEGATIVE
Nitrite: NEGATIVE
Protein, ur: NEGATIVE mg/dL
Specific Gravity, Urine: 1.022 (ref 1.005–1.030)
pH: 5 (ref 5.0–8.0)

## 2020-05-22 LAB — RAPID URINE DRUG SCREEN, HOSP PERFORMED
Amphetamines: NOT DETECTED
Barbiturates: NOT DETECTED
Benzodiazepines: NOT DETECTED
Cocaine: NOT DETECTED
Opiates: NOT DETECTED
Tetrahydrocannabinol: NOT DETECTED

## 2020-05-22 LAB — PROLACTIN: Prolactin: 70 ng/mL — ABNORMAL HIGH (ref 4.8–23.3)

## 2020-05-22 MED ORDER — GABAPENTIN 400 MG PO CAPS
400.0000 mg | ORAL_CAPSULE | Freq: Every day | ORAL | Status: DC
  Administered 2020-05-22: 400 mg via ORAL
  Filled 2020-05-22 (×3): qty 1

## 2020-05-22 MED ORDER — OLANZAPINE 10 MG PO TABS
10.0000 mg | ORAL_TABLET | Freq: Every day | ORAL | Status: DC
  Filled 2020-05-22 (×2): qty 1
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
## 2020-05-22 NOTE — Progress Notes (Signed)
The patient learned today that her medication was changed. Her goal for tomorrow is to sleep less.

## 2020-05-22 NOTE — Progress Notes (Signed)
Pt stated she felt the 400 mg Seroquel was too much , due to making her legs run too much in her sleep

## 2020-05-22 NOTE — Progress Notes (Signed)
   05/22/20 2200  Psych Admission Type (Psych Patients Only)  Admission Status Involuntary  Psychosocial Assessment  Patient Complaints Anxiety  Eye Contact Fair  Facial Expression Anxious;Sullen;Sad  Affect Anxious;Depressed;Sad;Sullen  Speech Logical/coherent;Soft  Interaction Minimal  Motor Activity Slow  Appearance/Hygiene Unremarkable  Behavior Characteristics Cooperative  Mood Anxious;Pleasant  Thought Process  Coherency WDL  Content WDL  Delusions None reported or observed  Perception WDL  Hallucination None reported or observed  Judgment WDL  Confusion None  Danger to Self  Current suicidal ideation? Denies

## 2020-05-22 NOTE — Plan of Care (Signed)
Progress note  D: pt found in bed; compliant with medication administration. Pt denies any physical complaints or pain. Pt is sad/sullen in affect. Pt has been resting most of the morning. Pt is pleasant but minimal. Pt denies si/hi/ah/vh and verbally agrees to approach staff if these become apparent or before harming themself/others while at [place]: Pt provided support and encouragement. Pt given medication per protocol and standing orders. Q65m safety checks implemented and continued.  R: Pt safe on the unit. Will continue to monitor.  Pt progressing in the following metrics  Problem: Education: Goal: Knowledge of [facility] Education information/materials will improve Outcome: Progressing Goal: Emotional status will improve Outcome: Progressing Goal: Mental status will improve Outcome: Progressing Goal: Verbalization of understanding the information provided will improve Outcome: Progressing

## 2020-05-22 NOTE — Progress Notes (Signed)
Recreation Therapy Notes  Date: 5.27.21 Time: 1000 Location: 500 Hall Dayroom  Group Topic: Anxiety  Goal Area(s) Addresses:  Patient will identify triggers to anxiety. Patient will identify physical symptoms when anxious. Patient will identify coping skills for anxiety.  Intervention: Worksheet, pencils  Activity: Introduction to Anxiety.  Patients were to identify triggers to anxiety, physical symptoms, thoughts they have and coping skills for being anxious.  Education: Communication, Discharge Planning  Education Outcome: Acknowledges understanding/In group clarification offered/Needs additional education.   Clinical Observations/Feedback: Pt did not attend group session.    Victorino Sparrow, LRT/CTRS    Victorino Sparrow A 05/22/2020 11:25 AM

## 2020-05-23 MED ORDER — OXCARBAZEPINE 300 MG PO TABS: 300.0000 mg | ORAL_TABLET | Freq: Two times a day (BID) | ORAL | 0 refills | Status: DC

## 2020-05-23 MED ORDER — PRAZOSIN HCL 2 MG PO CAPS: 2.0000 mg | ORAL_CAPSULE | Freq: Every day | ORAL | 0 refills | Status: DC

## 2020-05-23 MED ORDER — HYDROXYZINE HCL 25 MG PO TABS: 25.0000 mg | ORAL_TABLET | Freq: Four times a day (QID) | ORAL | 0 refills | Status: DC | PRN

## 2020-05-23 MED ORDER — PANTOPRAZOLE SODIUM 20 MG PO TBEC: 20.0000 mg | DELAYED_RELEASE_TABLET | Freq: Every day | ORAL | 0 refills | Status: DC

## 2020-05-23 MED ORDER — GABAPENTIN 400 MG PO CAPS: 400.0000 mg | ORAL_CAPSULE | Freq: Every day | ORAL | 0 refills | Status: DC

## 2020-05-23 MED ORDER — QUETIAPINE FUMARATE 400 MG PO TABS: 400.0000 mg | ORAL_TABLET | Freq: Every day | ORAL | 0 refills | Status: DC

## 2020-05-23 MED ORDER — BUSPIRONE HCL 15 MG PO TABS: 15.0000 mg | ORAL_TABLET | Freq: Two times a day (BID) | ORAL | 0 refills | Status: DC

## 2020-05-23 MED ORDER — OLANZAPINE 10 MG PO TABS: 10.0000 mg | ORAL_TABLET | Freq: Every day | ORAL | 0 refills | Status: DC

## 2020-05-23 NOTE — Progress Notes (Signed)
Pt discharged to lobby. Pt was stable and appreciative at that time. All papers and prescriptions were given and valuables returned. Verbal understanding expressed. Denies SI/HI and A/VH. Pt given opportunity to express concerns and ask questions.  

## 2020-05-23 NOTE — [facility] Suicide Risk Assessment (Signed)
[facility] Discharge Suicide Risk Assessment   Principal Problem: <principal problem not specified> Discharge Diagnoses: Active Problems:   Schizophrenia ([place])   Total Time spent with patient: 20 minutes  Musculoskeletal: Strength & Muscle Tone: within normal limits Gait & Station: normal Patient leans: N/A  Psychiatric Specialty Exam: Review of Systems  Neurological: Positive for dizziness.  All other systems reviewed and are negative.   Blood pressure (!) 82/61, pulse (!) 111, temperature 98.4 F (36.9 C), temperature source Oral, resp. rate 20, height 5\' 3"  (1.6 m), weight 94.8 kg, last menstrual period 04/15/2020, SpO2 94 %.Body mass index is 37.02 kg/m.  General Appearance: Casual  Eye Contact::  Good  Speech:  Normal Rate409  Volume:  Normal  Mood:  Euthymic  Affect:  Congruent  Thought Process:  Coherent and Descriptions of Associations: Intact  Orientation:  Full (Time, Place, and Person)  Thought Content:  Logical  Suicidal Thoughts:  No  Homicidal Thoughts:  No  Memory:  Immediate;   Good Recent;   Good Remote;   Good  Judgement:  Intact  Insight:  Fair  Psychomotor Activity:  Normal  Concentration:  Good  Recall:  [place] of Knowledge:Fair  Language: Fair  Akathisia:  Negative  Handed:  Right  AIMS (if indicated):     Assets:  Communication Skills Desire for Improvement Housing Resilience Social Support  Sleep:  Number of Hours: 6.5  Cognition: WNL  ADL's:  Intact   Mental Status Per Nursing Assessment::   On Admission:  Suicidal ideation indicated by patient  Demographic Factors:  Caucasian and Unemployed  Loss Factors: NA  Historical Factors: Impulsivity  Risk Reduction Factors:   Sense of responsibility to family, Living with another person, especially a relative and Positive social support  Continued Clinical Symptoms:  Depression:   Impulsivity Schizophrenia:   Depressive state Personality Disorders:   Cluster B  Cognitive  Features That Contribute To Risk:  None    Suicide Risk:  Minimal: No identifiable suicidal ideation.  Patients presenting with no risk factors but with morbid ruminations; may be classified as minimal risk based on the severity of the depressive symptoms  Follow-up [place], [place]. Go on 05/28/2020.   Why: You are scheduled for a hospital discharge appointment on 05/28/20 at 8:30 am.  This appointment will be held in person.   Contact information: [place] [address] [place]. Go on 05/27/2020.   Why: You have an appointment for therapy on 05/27/20 at 8:15 am with [facility].  This appointment will be held in person.   Contact information: [place], [place] [address]  P: [phone] F: [phone]          Plan Of Care/Follow-up recommendations:  Activity:  ad lib  Sharma Covert, MD 05/23/2020, 9:14 AM

## 2020-05-23 NOTE — Progress Notes (Signed)
  [facility] Adult Case Management Discharge Plan :  Will you be returning to the same living situation after discharge:  Yes,  will be returning to mothers home. At discharge, do you have transportation home?: No. Safe Transport will be arranged. Do you have the ability to pay for your medications: Yes,  has insurance.  Release of information consent forms completed and in the chart;  Patient's signature needed at discharge.  Patient to Follow up at: [name], [place]. Go on 05/28/2020.   Why: You are scheduled for a hospital discharge appointment on 05/28/20 at 8:30 am.  This appointment will be held in person.   Contact information: [place] [address] [place]. Go on 05/27/2020.   Why: You have an appointment for therapy on 05/27/20 at 8:15 am with [facility].  This appointment will be held in person.   Contact information: [place], [place] [address]  P: [phone] F: [phone]          Next level of care provider has access to [facility] and Suicide Prevention discussed: Yes,  with mother.  Have you used any form of tobacco in the last 30 days? (Cigarettes, Smokeless Tobacco, Cigars, and/or Pipes): No  Has patient been referred to the Quitline?: N/A patient is not a smoker  Patient has been referred for addiction treatment: N/A  Vassie Moselle, LCSWA 05/23/2020, 9:38 AM
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
## 2020-05-29 ENCOUNTER — Emergency Department (HOSPITAL_COMMUNITY)
Admission: EM | Admit: 2020-05-29 | Discharge: 2020-05-30 | Disposition: A | Discharge status: Other Acute Inpatient Hospital | Payer: Medicare Other | Source: Home / Self Care

## 2020-05-29 ENCOUNTER — Encounter (HOSPITAL_COMMUNITY): Payer: Self-pay

## 2020-05-29 ENCOUNTER — Other Ambulatory Visit: Payer: Self-pay

## 2020-05-29 DIAGNOSIS — Z79899 Other long term (current) drug therapy: Secondary | ICD-10-CM | POA: Insufficient documentation

## 2020-05-29 DIAGNOSIS — I10 Essential (primary) hypertension: Secondary | ICD-10-CM | POA: Insufficient documentation

## 2020-05-29 DIAGNOSIS — T1491XA Suicide attempt, initial encounter: Secondary | ICD-10-CM

## 2020-05-29 DIAGNOSIS — F2089 Other schizophrenia: Secondary | ICD-10-CM | POA: Insufficient documentation

## 2020-05-29 DIAGNOSIS — Z20822 Contact with and (suspected) exposure to covid-19: Secondary | ICD-10-CM | POA: Insufficient documentation

## 2020-05-29 DIAGNOSIS — T50912A Poisoning by multiple unspecified drugs, medicaments and biological substances, intentional self-harm, initial encounter: Secondary | ICD-10-CM | POA: Insufficient documentation

## 2020-05-29 DIAGNOSIS — Z720 Tobacco use: Secondary | ICD-10-CM | POA: Insufficient documentation

## 2020-05-29 DIAGNOSIS — R45851 Suicidal ideations: Secondary | ICD-10-CM

## 2020-05-29 LAB — ETHANOL: Alcohol, Ethyl (B): <10 mg/dL (ref <10)

## 2020-05-29 LAB — RAPID URINE DRUG SCREEN, HOSP PERFORMED
Amphetamines: NOT DETECTED
Barbiturates: NOT DETECTED
Benzodiazepines: NOT DETECTED
Cocaine: NOT DETECTED
Opiates: NOT DETECTED
Tetrahydrocannabinol: NOT DETECTED

## 2020-05-29 LAB — SARS CORONAVIRUS 2 BY RT PCR (HOSPITAL ORDER, PERFORMED IN ~~LOC~~ HOSPITAL LAB): SARS Coronavirus 2: NEGATIVE

## 2020-05-29 LAB — COMPREHENSIVE METABOLIC PANEL
ALT: 32 U/L (ref 0–44)
AST: 26 U/L (ref 15–41)
Albumin: 3.9 g/dL (ref 3.5–5.0)
Alkaline Phosphatase: 121 U/L (ref 38–126)
Anion gap: 10 (ref 5–15)
BUN: 11 mg/dL (ref 6–20)
CO2: 20 mmol/L — ABNORMAL LOW (ref 22–32)
Calcium: 9.4 mg/dL (ref 8.9–10.3)
Chloride: 109 mmol/L (ref 98–111)
Creatinine, Ser: 0.95 mg/dL (ref 0.44–1.00)
GFR calc Af Amer: >60 mL/min (ref >60)
GFR calc non Af Amer: >60 mL/min (ref >60)
Glucose, Bld: 109 mg/dL — ABNORMAL HIGH (ref 70–99)
Potassium: 4.3 mmol/L (ref 3.5–5.1)
Sodium: 139 mmol/L (ref 135–145)
Total Bilirubin: 0.4 mg/dL (ref 0.3–1.2)
Total Protein: 6.9 g/dL (ref 6.5–8.1)

## 2020-05-29 LAB — CBC
HCT: 41 % (ref 36.0–46.0)
Hemoglobin: 12.6 g/dL (ref 12.0–15.0)
MCH: 26.1 pg (ref 26.0–34.0)
MCHC: 30.7 g/dL (ref 30.0–36.0)
MCV: 85.1 fL (ref 80.0–100.0)
Platelets: 385 10*3/uL (ref 150–400)
RBC: 4.82 MIL/uL (ref 3.87–5.11)
RDW: 15.9 % — ABNORMAL HIGH (ref 11.5–15.5)
WBC: 9.7 10*3/uL (ref 4.0–10.5)
nRBC: 0 % (ref 0.0–0.2)

## 2020-05-29 LAB — MAGNESIUM: Magnesium: 1.9 mg/dL (ref 1.7–2.4)

## 2020-05-29 LAB — CBG MONITORING, ED: Glucose-Capillary: 110 mg/dL — ABNORMAL HIGH (ref 70–99)

## 2020-05-29 LAB — I-STAT BETA HCG BLOOD, ED (MC, WL, AP ONLY): I-stat hCG, quantitative: 5.7 m[IU]/mL — ABNORMAL HIGH (ref <5)

## 2020-05-29 LAB — SALICYLATE LEVEL: Salicylate Lvl: <7 mg/dL — ABNORMAL LOW (ref 7.0–30.0)

## 2020-05-29 LAB — ACETAMINOPHEN LEVEL: Acetaminophen (Tylenol), Serum: <10 ug/mL — ABNORMAL LOW (ref 10–30)

## 2020-05-29 MED ORDER — SODIUM CHLORIDE 0.9 % IV BOLUS
1000.0000 mL | Freq: Once | INTRAVENOUS | Status: AC
  Administered 2020-05-29: 1000 mL via INTRAVENOUS

## 2020-05-29 MED ORDER — LORAZEPAM 1 MG PO TABS
1.0000 mg | ORAL_TABLET | Freq: Once | ORAL | Status: AC
  Administered 2020-05-29: 1 mg via ORAL
  Filled 2020-05-29: qty 1

## 2020-05-29 MED ORDER — POTASSIUM CHLORIDE CRYS ER 20 MEQ PO TBCR
40.0000 meq | EXTENDED_RELEASE_TABLET | Freq: Once | ORAL | Status: AC
  Administered 2020-05-29: 40 meq via ORAL
  Filled 2020-05-29: qty 2

## 2020-05-29 MED ORDER — ROPINIROLE HCL 0.5 MG PO TABS
0.5000 mg | ORAL_TABLET | ORAL | Status: AC
  Administered 2020-05-29: 0.5 mg via ORAL
  Filled 2020-05-29: qty 1

## 2020-05-29 MED ORDER — MAGNESIUM SULFATE 2 GM/50ML IV SOLN
2.0000 g | Freq: Once | INTRAVENOUS | Status: AC
  Administered 2020-05-29: 2 g via INTRAVENOUS
  Filled 2020-05-29: qty 50

## 2020-05-29 NOTE — ED Triage Notes (Signed)
Pt arrives to ED w/ c/o suicidal attempt. Pt states she took 5-6 Seroquel w/ the intent to commit suicide.

## 2020-05-29 NOTE — ED Notes (Signed)
Belongings to [employer] 6
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
## 2020-05-30 ENCOUNTER — Encounter (HOSPITAL_COMMUNITY): Payer: Self-pay | Admitting: Psychiatry

## 2020-05-30 ENCOUNTER — Inpatient Hospital Stay (HOSPITAL_COMMUNITY)
Admission: AD | Admit: 2020-05-30 | Discharge: 2020-06-04 | DRG: 885 | Disposition: A | Discharge status: Home / Self Care | Payer: Medicare Other | Source: Intra-hospital | Attending: Psychiatry | Admitting: Psychiatry

## 2020-05-30 ENCOUNTER — Other Ambulatory Visit: Payer: Self-pay

## 2020-05-30 DIAGNOSIS — Z881 Allergy status to other antibiotic agents status: Secondary | ICD-10-CM

## 2020-05-30 DIAGNOSIS — Z87442 Personal history of urinary calculi: Secondary | ICD-10-CM

## 2020-05-30 DIAGNOSIS — R4584 Anhedonia: Secondary | ICD-10-CM | POA: Diagnosis present

## 2020-05-30 DIAGNOSIS — F1021 Alcohol dependence, in remission: Secondary | ICD-10-CM | POA: Diagnosis present

## 2020-05-30 DIAGNOSIS — K219 Gastro-esophageal reflux disease without esophagitis: Secondary | ICD-10-CM | POA: Diagnosis present

## 2020-05-30 DIAGNOSIS — Z880 Allergy status to penicillin: Secondary | ICD-10-CM

## 2020-05-30 DIAGNOSIS — F17200 Nicotine dependence, unspecified, uncomplicated: Secondary | ICD-10-CM | POA: Diagnosis present

## 2020-05-30 DIAGNOSIS — Z596 Low income: Secondary | ICD-10-CM | POA: Diagnosis not present

## 2020-05-30 DIAGNOSIS — F603 Borderline personality disorder: Secondary | ICD-10-CM | POA: Diagnosis present

## 2020-05-30 DIAGNOSIS — E785 Hyperlipidemia, unspecified: Secondary | ICD-10-CM | POA: Diagnosis present

## 2020-05-30 DIAGNOSIS — Z8614 Personal history of Methicillin resistant Staphylococcus aureus infection: Secondary | ICD-10-CM

## 2020-05-30 DIAGNOSIS — F4481 Dissociative identity disorder: Secondary | ICD-10-CM | POA: Diagnosis present

## 2020-05-30 DIAGNOSIS — Z20822 Contact with and (suspected) exposure to covid-19: Secondary | ICD-10-CM | POA: Diagnosis present

## 2020-05-30 DIAGNOSIS — I10 Essential (primary) hypertension: Secondary | ICD-10-CM | POA: Diagnosis present

## 2020-05-30 DIAGNOSIS — Z791 Long term (current) use of non-steroidal anti-inflammatories (NSAID): Secondary | ICD-10-CM

## 2020-05-30 DIAGNOSIS — Z86018 Personal history of other benign neoplasm: Secondary | ICD-10-CM

## 2020-05-30 DIAGNOSIS — Z9114 Patient's other noncompliance with medication regimen: Secondary | ICD-10-CM | POA: Diagnosis not present

## 2020-05-30 DIAGNOSIS — F200 Paranoid schizophrenia: Secondary | ICD-10-CM | POA: Diagnosis not present

## 2020-05-30 DIAGNOSIS — F411 Generalized anxiety disorder: Secondary | ICD-10-CM | POA: Diagnosis present

## 2020-05-30 DIAGNOSIS — R569 Unspecified convulsions: Secondary | ICD-10-CM | POA: Diagnosis present

## 2020-05-30 DIAGNOSIS — G4700 Insomnia, unspecified: Secondary | ICD-10-CM | POA: Diagnosis present

## 2020-05-30 DIAGNOSIS — Z85828 Personal history of other malignant neoplasm of skin: Secondary | ICD-10-CM

## 2020-05-30 DIAGNOSIS — R4587 Impulsiveness: Secondary | ICD-10-CM | POA: Diagnosis present

## 2020-05-30 DIAGNOSIS — F4310 Post-traumatic stress disorder, unspecified: Secondary | ICD-10-CM | POA: Diagnosis present

## 2020-05-30 DIAGNOSIS — F251 Schizoaffective disorder, depressive type: Secondary | ICD-10-CM | POA: Diagnosis present

## 2020-05-30 DIAGNOSIS — F209 Schizophrenia, unspecified: Secondary | ICD-10-CM | POA: Diagnosis present

## 2020-05-30 LAB — GLUCOSE, CAPILLARY: Glucose-Capillary: 124 mg/dL — ABNORMAL HIGH (ref 70–99)

## 2020-05-30 MED ORDER — BUSPIRONE HCL 15 MG PO TABS
15.0000 mg | ORAL_TABLET | Freq: Three times a day (TID) | ORAL | Status: DC
  Administered 2020-05-30 – 2020-06-04 (×15): 15 mg via ORAL
  Filled 2020-05-30 (×9): qty 1
  Filled 2020-05-30: qty 3
  Filled 2020-05-30 (×2): qty 1
  Filled 2020-05-30: qty 3
  Filled 2020-05-30 (×7): qty 1

## 2020-05-30 MED ORDER — MAGNESIUM OXIDE 400 (241.3 MG) MG PO TABS
400.0000 mg | ORAL_TABLET | Freq: Every day | ORAL | Status: DC
  Administered 2020-05-30 – 2020-06-03 (×5): 400 mg via ORAL
  Filled 2020-05-30 (×6): qty 1

## 2020-05-30 MED ORDER — CARBAMAZEPINE 100 MG PO CHEW
100.0000 mg | CHEWABLE_TABLET | Freq: Two times a day (BID) | ORAL | Status: DC
  Administered 2020-05-30 – 2020-06-01 (×4): 100 mg via ORAL
  Filled 2020-05-30 (×8): qty 1

## 2020-05-30 MED ORDER — RISPERIDONE 1 MG PO TABS
1.0000 mg | ORAL_TABLET | Freq: Every day | ORAL | Status: DC
  Administered 2020-05-30 – 2020-05-31 (×2): 1 mg via ORAL
  Filled 2020-05-30 (×3): qty 1

## 2020-05-30 MED ORDER — ACETAMINOPHEN 325 MG PO TABS
650.0000 mg | ORAL_TABLET | Freq: Four times a day (QID) | ORAL | Status: DC | PRN
  Administered 2020-06-02 – 2020-06-03 (×2): 650 mg via ORAL
  Filled 2020-05-30 (×2): qty 2

## 2020-05-30 MED ORDER — GABAPENTIN 400 MG PO CAPS
400.0000 mg | ORAL_CAPSULE | Freq: Three times a day (TID) | ORAL | Status: DC
  Administered 2020-05-30 – 2020-06-04 (×15): 400 mg via ORAL
  Filled 2020-05-30 (×20): qty 1

## 2020-05-30 MED ORDER — TEMAZEPAM 15 MG PO CAPS
15.0000 mg | ORAL_CAPSULE | Freq: Once | ORAL | Status: AC
  Administered 2020-05-30: 15 mg via ORAL
  Filled 2020-05-30: qty 1

## 2020-05-30 MED ORDER — ALUM & MAG HYDROXIDE-SIMETH 200-200-20 MG/5ML PO SUSP
30.0000 mL | ORAL | Status: DC | PRN
  Administered 2020-06-03: 30 mL via ORAL

## 2020-05-30 MED ORDER — MAGNESIUM CHLORIDE 64 MG PO TBEC: 1.0000 | DELAYED_RELEASE_TABLET | Freq: Every day | ORAL | Status: DC

## 2020-05-30 MED ORDER — PRAZOSIN HCL 2 MG PO CAPS
2.0000 mg | ORAL_CAPSULE | Freq: Every day | ORAL | Status: DC
  Administered 2020-05-30 – 2020-06-03 (×5): 2 mg via ORAL
  Filled 2020-05-30 (×5): qty 1
  Filled 2020-05-30: qty 2
  Filled 2020-05-30: qty 1

## 2020-05-30 MED ORDER — RISPERIDONE 0.5 MG PO TABS
0.5000 mg | ORAL_TABLET | Freq: Every day | ORAL | Status: DC
  Administered 2020-05-30 – 2020-06-01 (×3): 0.5 mg via ORAL
  Filled 2020-05-30 (×5): qty 1

## 2020-05-30 MED ORDER — OLANZAPINE 10 MG PO TABS
10.0000 mg | ORAL_TABLET | Freq: Every day | ORAL | Status: DC
  Filled 2020-05-30: qty 1

## 2020-05-30 MED ORDER — MELOXICAM 15 MG PO TABS
15.0000 mg | ORAL_TABLET | Freq: Every day | ORAL | Status: DC
  Administered 2020-05-30 – 2020-06-04 (×6): 15 mg via ORAL
  Filled 2020-05-30 (×4): qty 1
  Filled 2020-05-30: qty 2
  Filled 2020-05-30 (×3): qty 1

## 2020-05-30 MED ORDER — MAGNESIUM HYDROXIDE 400 MG/5ML PO SUSP: 30.0000 mL | Freq: Every day | ORAL | Status: DC | PRN

## 2020-05-30 MED ORDER — PANTOPRAZOLE SODIUM 40 MG PO TBEC
40.0000 mg | DELAYED_RELEASE_TABLET | Freq: Every day | ORAL | Status: DC
  Filled 2020-05-30 (×2): qty 1

## 2020-05-30 MED ORDER — ROPINIROLE HCL ER 4 MG PO TB24
4.0000 mg | ORAL_TABLET | Freq: Every day | ORAL | Status: DC
  Administered 2020-05-30 – 2020-06-03 (×5): 4 mg via ORAL
  Filled 2020-05-30 (×6): qty 1

## 2020-05-30 MED ORDER — OXCARBAZEPINE 300 MG PO TABS
300.0000 mg | ORAL_TABLET | Freq: Two times a day (BID) | ORAL | Status: DC
  Filled 2020-05-30 (×2): qty 1
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
## 2020-05-30 NOTE — [facility] Group Notes (Signed)
Type of Therapy/Topic: Group Therapy: Emotion Regulation  Participation Level: Active  Description of Group: The purpose of this group is to assist patients in learning to regulate negative emotions and experience positive emotions. Patients will be guided to discuss ways in which they have been vulnerable to their negative emotions. These vulnerabilities will be juxtaposed with experiences of positive emotions or situations, and patients will be challenged to use positive emotions to combat negative ones. Special emphasis will be placed on coping with negative emotions in conflict situations, and patients will process healthy conflict resolution skills.  Therapeutic Goals: 1. Patient will identify two positive emotions or experiences to reflect on in order to balance out negative emotions 2. Patient will label two or more emotions that they find the most difficult to experience 3. Patient will demonstrate positive conflict resolution skills through discussion and/or role plays  Summary of Patient Progress: Patient was provided printed worksheet. The topic and worksheet was reviewed with patient. Patient was provided the opportunity to ask questions and was provided individual feedback.   Therapeutic Modalities: Cognitive Behavioral Therapy Feelings Identification Dialectical Behavioral Therapy

## 2020-05-30 NOTE — ED Notes (Signed)
Attempted to call nursing report to [facility]
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
## 2020-05-30 NOTE — ED Notes (Signed)
Safe transport here to transfer pt to [facility] Adult Unit per MD order. Personal property and signed voluntary admission form accompanies pt. Ambulatory off unit. No s/s of distress noted.

## 2020-05-30 NOTE — ED Notes (Signed)
Pt has been accepted to [facility] and can arrive after 0900. This information was provided to pt's nurse, Lenice Pressman, at [phone].  Room: 507-1 Accepting: Talbot Grumbling, NP Attending: Dr. Amedeo Plenty Call to Report: [phone]
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
## 2020-05-30 NOTE — BHH Suicide Risk Assessment (Signed)
[facility] Admission Suicide Risk Assessment   Nursing information obtained from:    Demographic factors:    Current Mental Status:    Loss Factors:    Historical Factors:    Risk Reduction Factors:     Total Time spent with patient: 30 minutes Principal Problem: <principal problem not specified> Diagnosis:  Active Problems:   Schizophrenia ([place])  Subjective Data: Patient is seen and examined.  Patient is a 49 year old female with a past psychiatric history significant for psychosis who presented to the [facility] emergency department on 05/30/2020 after an intentional overdose of 5 or 6-400 mg Seroquel tablets.  She stated at that time the auditory hallucinations ([place]) was talking to her, and caused her to get significantly anxious.  The voices were screaming at her.  She just wanted to die at that time.  She had available old Seroquel and BuSpar and overdosed on that.  She was recently admitted to our facility on 05/20/2020 to 05/23/2020.  She was diagnosed with schizoaffective disorder; depressive type at that time.  She was discharged on BuSpar, gabapentin, Zyprexa, Trileptal, prazosin and Seroquel.  She stated that after that hospitalization she was unable to afford her medications.  She stated she did not have any medicines after discharge.  That probably explains the exacerbation of her symptoms.  She stated she needs to keep her medicines to take when she gets out of the hospital.  She stated that she had been on Haldol in the past as well as Vistaril, but Vistaril caused additional prolongation of her QTc interval.  She has previously also been diagnosed with borderline personality disorder, posttraumatic stress disorder and generalized anxiety disorder.  She has been admitted to our hospital numerous times and has had numerous suicide attempts.  She also had been hospitalized earlier in May at [facility]  At that time she also was unable to fill her Zyprexa  prescription and had been noncompliant with her medications.  She was admitted to the hospital for evaluation and stabilization.  Continued Clinical Symptoms:    The "Alcohol Use Disorders Identification Test", Guidelines for Use in Primary Care, Second Edition.  World Pharmacologist [facility]). Score between 0-7:  no or low risk or alcohol related problems. Score between 8-15:  moderate risk of alcohol related problems. Score between 16-19:  high risk of alcohol related problems. Score 20 or above:  warrants further diagnostic evaluation for alcohol dependence and treatment.   CLINICAL FACTORS:   Severe Anxiety and/or Agitation Depression:   Anhedonia Delusional Hopelessness Impulsivity Insomnia Schizophrenia:   Command hallucinatons Paranoid or undifferentiated type Personality Disorders:   Cluster B More than one psychiatric diagnosis   Musculoskeletal: Strength & Muscle Tone: within normal limits Gait & Station: normal Patient leans: N/A  Psychiatric Specialty Exam: Physical Exam  Nursing note and vitals reviewed. Constitutional: She is oriented to person, place, and time. She appears well-developed and well-nourished.  HENT:  Head: Normocephalic and atraumatic.  Respiratory: Effort normal.  Neurological: She is alert and oriented to person, place, and time.    Review of Systems  There were no vitals taken for this visit.There is no height or weight on file to calculate BMI.  General Appearance: Casual  Eye Contact:  Fair  Speech:  Normal Rate  Volume:  Normal  Mood:  Anxious, Depressed and Dysphoric  Affect:  Congruent  Thought Process:  Coherent and Descriptions of Associations: Loose  Orientation:  Full (Time, Place, and Person)  Thought Content:  Hallucinations: Auditory  Suicidal Thoughts:  Yes.  with intent/plan  Homicidal Thoughts:  No  Memory:  Immediate;   Poor Recent;   Poor Remote;   Poor  Judgement:  Impaired  Insight:  Lacking  Psychomotor  Activity:  Increased  Concentration:  Concentration: Fair and Attention Span: Fair  Recall:  [employer] of Knowledge:  Fair  Language:  Fair  Akathisia:  Negative  Handed:  Right  AIMS (if indicated):     Assets:  Desire for Improvement Resilience  ADL's:  Intact  Cognition:  WNL  Sleep:         COGNITIVE FEATURES THAT CONTRIBUTE TO RISK:  Thought constriction (tunnel vision)    SUICIDE RISK:   Mild:  Suicidal ideation of limited frequency, intensity, duration, and specificity.  There are no identifiable plans, no associated intent, mild dysphoria and related symptoms, good self-control (both objective and subjective assessment), few other risk factors, and identifiable protective factors, including available and accessible social support.  PLAN OF CARE: Patient is seen and examined.  Patient is a 49 year old female with the above-stated past psychiatric history who was admitted secondary to worsening psychosis and auditory hallucinations.  She will be admitted to the hospital.  She will be integrated in the milieu.  She will be encouraged to attend groups.  She has been unable to afford her medications, and we discussed this.  She stated that she had been previously treated with haloperidol as well as risperidone.  We will start Risperdal 0.5 mg p.o. daily and 1 mg p.o. nightly.  She does have a prolonged QTc interval at 512.  Her EKG from 5/25 showed a QT interval of 449.  That was on the date of discharge from her last hospitalization.  I have added magnesium supplementation to her diet and her potassium is normal currently.  This will be monitored.  We will also get an EKG basically each a.m. to make sure that the QTc interval is decreasing.  Given the expense of Trileptal we will go with Tegretol.  We will start at 100 mg p.o. twice daily and titrate that during the course of the hospitalization.  Clearly her multiple hospitalizations need to be addressed.  She has also had ECT in the  past which apparently was not successful.  We will have social work talk with the possibility of having an ACT team be involved, but apparently in [facility] there have been issues with being able to find ACTT services.  We will continue her BuSpar, and because the potential for QTC prolongation we will avoid Vistaril.  We will also restart her Neurontin 400 mg p.o. 3 times daily.  She is requesting antidepressant medication, but at least review of the electronic medical record revealed no treatment with any kind of antidepressant medications in the past.  I suspect that this is been because of concern for the possibility of eliciting a manic episode.  We will consider that during the course of the hospitalization.  Review of her laboratories revealed essentially normal electrolytes with a magnesium of 1.9 and a potassium of 4.3.  Liver function enzymes were normal.  Her total cholesterol was increased to 209, but this is from 5/26.  Her CBC was normal.  Acetaminophen was less than 10, salicylate less than 7.  Prolactin was 70 (but that is from 5/26) hemoglobin A1c is 5.9.  TSH from 5/26 was 1.479.  We do not have an accurate urinalysis, but her glucose on 5/27 in her  urine was greater than 500.  We will have to monitor for possible diabetes.  Blood alcohol was less than 10.  Drug screen was negative.  Beta-hCG was mildly elevated at 5.7, but this is probably not secondary to pregnancy.  EKG as per above.  I certify that inpatient services furnished can reasonably be expected to improve the patient's condition.   Sharma Covert, MD 05/30/2020, 2:13 PM

## 2020-05-30 NOTE — BH Assessment (Signed)
Pt has been accepted to [facility] and can arrive after 0900. This information was provided to pt's nurse, Lenice Pressman, at [phone].  Room: 507-1 Accepting: Talbot Grumbling, NP Attending: Dr. Amedeo Plenty Call to Report: [phone]

## 2020-05-30 NOTE — Plan of Care (Signed)
Newly admitted. Has been in the dayroom with peers, calm and cooperative. Alert and oriented. Denying thoughts of self harm. Denying hallucinations. Currently attending group with no sign of distress.

## 2020-05-30 NOTE — Progress Notes (Signed)
Pt reports that she felt dizzy, pt sat down, staff did vital signs, unremarkable, no diaphoresis, CBG checked and was 124. Pt was assisted to her room to rest, shortly thereafter it resolved. Will continue to monitor pt per Q15 minute face checks and monitor for safety and progress. Reported off to next shift this above information.

## 2020-05-31 DIAGNOSIS — F200 Paranoid schizophrenia: Secondary | ICD-10-CM

## 2020-05-31 MED ORDER — HYDROXYZINE HCL 25 MG PO TABS
25.0000 mg | ORAL_TABLET | ORAL | Status: DC | PRN
  Administered 2020-05-31 – 2020-06-03 (×7): 25 mg via ORAL
  Filled 2020-05-31 (×8): qty 1
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
## 2020-05-31 NOTE — Progress Notes (Signed)
   05/31/20 1500  Psych Admission Type (Psych Patients Only)  Admission Status Voluntary  Psychosocial Assessment  Patient Complaints Anxiety  Eye Contact Fair  Facial Expression Flat  Affect Flat  Speech Logical/coherent  Interaction Minimal  Motor Activity Other (Comment) (WNL)  Appearance/Hygiene Meticulous  Behavior Characteristics Cooperative;Anxious  Mood Depressed;Anxious  Aggressive Behavior  Targets Self  Effect No apparent injury  Thought Process  Coherency WDL  Content WDL  Delusions WDL  Perception WDL  Hallucination None reported or observed  Judgment Poor  Confusion WDL  Danger to Self  Current suicidal ideation? Denies  Self-Injurious Behavior Some self-injurious ideation observed or expressed.  No lethal plan expressed   Agreement Not to Harm Self Yes  Description of Agreement pt agrees to come to staff before she decides to hurt herself  Danger to Others  Danger to Others None reported or observed

## 2020-05-31 NOTE — Progress Notes (Signed)
   05/31/20 2239  COVID-19 Daily Checkoff  Have you had a fever (temp > 37.80C/100F)  in the past 24 hours?  No  If you have had runny nose, nasal congestion, sneezing in the past 24 hours, has it worsened? No  COVID-19 EXPOSURE  Have you traveled outside the state in the past 14 days? No  Have you been in contact with someone with a confirmed diagnosis of COVID-19 or PUI in the past 14 days without wearing appropriate PPE? No  Have you been living in the same home as a person with confirmed diagnosis of COVID-19 or a PUI (household contact)? No  Have you been diagnosed with COVID-19? No
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
## 2020-05-31 NOTE — Progress Notes (Signed)
Patient wanted to clarify that a badge was needed to enter the building as she pointed out the window in the 300 hall dayroom. Pt reported being worried about the cartel getting in here. Pt was assured that she was safe and the doors were locked with a security guard standing by.

## 2020-05-31 NOTE — BHH Suicide Risk Assessment (Signed)
[place] INPATIENT:  Family/Significant Other Suicide Prevention Education  Suicide Prevention Education:  Patient Refusal for Family/Significant Other Suicide Prevention Education: The patient [name] [name] has refused to provide written consent for family/significant other to be provided Family/Significant Other Suicide Prevention Education during admission and/or prior to discharge.  Physician notified.  Rolanda Jay 05/31/2020, 3:50 PM

## 2020-05-31 NOTE — Progress Notes (Signed)
   05/31/20 2249  Psych Admission Type (Psych Patients Only)  Admission Status Voluntary  Psychosocial Assessment  Patient Complaints Anxiety  Eye Contact Fair  Facial Expression Flat  Affect Anxious;Preoccupied;Fearful  Speech Soft  Interaction Minimal;Guarded  Motor Activity Other (Comment) (WDL)  Appearance/Hygiene Unremarkable  Behavior Characteristics Fidgety;Anxious  Mood Fearful;Preoccupied  Thought Process  Coherency WDL  Content Paranoia  Delusions Persecutory;Paranoid  Perception Illusions  Hallucination None reported or observed  Judgment Poor  Confusion Moderate  Danger to Self  Current suicidal ideation? Denies  Self-Injurious Behavior No self-injurious ideation or behavior indicators observed or expressed   Agreement Not to Harm Self Yes  Description of Agreement pt agrees to come to staff before she decides to hurt herself  Danger to Others  Danger to Others None reported or observed
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
## 2020-06-01 LAB — GLUCOSE, CAPILLARY: Glucose-Capillary: 112 mg/dL — ABNORMAL HIGH (ref 70–99)

## 2020-06-01 MED ORDER — RISPERIDONE 0.5 MG PO TABS
0.5000 mg | ORAL_TABLET | Freq: Once | ORAL | Status: AC
  Administered 2020-06-01: 0.5 mg via ORAL
  Filled 2020-06-01 (×2): qty 1

## 2020-06-01 MED ORDER — RISPERIDONE 2 MG PO TABS
2.0000 mg | ORAL_TABLET | Freq: Every day | ORAL | Status: DC
  Filled 2020-06-01: qty 1

## 2020-06-01 MED ORDER — TRAZODONE HCL 50 MG PO TABS
50.0000 mg | ORAL_TABLET | Freq: Every evening | ORAL | Status: DC | PRN
  Administered 2020-06-01 – 2020-06-03 (×2): 50 mg via ORAL
  Filled 2020-06-01 (×2): qty 1

## 2020-06-01 MED ORDER — CARBAMAZEPINE 100 MG PO CHEW
200.0000 mg | CHEWABLE_TABLET | ORAL | Status: AC
  Administered 2020-06-01: 200 mg via ORAL
  Filled 2020-06-01 (×2): qty 2

## 2020-06-01 MED ORDER — RISPERIDONE 2 MG PO TABS
2.0000 mg | ORAL_TABLET | Freq: Every day | ORAL | Status: DC
  Administered 2020-06-01: 2 mg via ORAL
  Filled 2020-06-01 (×2): qty 1

## 2020-06-01 MED ORDER — CARBAMAZEPINE 100 MG PO CHEW
200.0000 mg | CHEWABLE_TABLET | Freq: Two times a day (BID) | ORAL | Status: DC
  Administered 2020-06-01 – 2020-06-04 (×6): 200 mg via ORAL
  Filled 2020-06-01 (×8): qty 2

## 2020-06-01 MED ORDER — RISPERIDONE 1 MG PO TABS
1.0000 mg | ORAL_TABLET | Freq: Every day | ORAL | Status: DC
  Administered 2020-06-02 – 2020-06-04 (×3): 1 mg via ORAL
  Filled 2020-06-01 (×5): qty 1

## 2020-06-01 NOTE — [facility] Group Notes (Signed)
[place] Group Notes:  (Nursing/MHT/Case Management/Adjunct)  Date:  06/01/2020  Time:  12:55 AM  Type of Therapy:  Group Therapy  Participation Level:  Active  Participation Quality:  Appropriate  Affect:  Appropriate  Cognitive:  Appropriate  Insight:  Appropriate  Engagement in Group:  Engaged  Modes of Intervention:  Discussion  Summary of Progress/Problems: pt stated that today was a good day. Pt enjoyed going outside for fresh air. Defendant stated that she was able to walk around outside.   Theodoro Grist D 06/01/2020, 12:55 AM

## 2020-06-01 NOTE — Progress Notes (Signed)
D:  Patient's self inventory sheet, patient has fair sleep, sleep medication helpful.  Good appetite, low energy level, good concentration.  Rated depression and hopeless 7, anxiety 9.  Denied withdrawals.  Denied SI.  Denied physical problems.  Denied physical pain.  Goal is take correct meds.  Plans to talk to MD.  No discharge plans. A:  Medications administered per MD orders.  Emotional support and encouragement given patient. R:  Patient denied SI and HI, contracts for safety  Denied A/V hallucinations.  Safety maintained with 15 minute checks.

## 2020-06-01 NOTE — [facility] Group Notes (Signed)
[place] Group Notes: (Clinical Social Work)   06/01/2020      Type of Therapy:  Group Therapy   Participation Level:  Did Not Attend - was invited individually by Nurse/MHT and chose not to attend.   Blane Ohara, LCSWA 06/01/2020  12:51 PM
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
## 2020-06-01 NOTE — Progress Notes (Signed)
[facility] Notes:  (Nursing/MHT/Case Management/Adjunct)  Date:  06/01/2020  Time:  2000  Type of Therapy:  wrap up group  Participation Level:  Active  Participation Quality:  Appropriate, Attentive, Sharing and Supportive  Affect:  Anxious  Cognitive:  Delusional  Insight:  Lacking  Engagement in Group:  Engaged  Modes of Intervention:  Clarification, Education and Support  Summary of Progress/Problems: Positive thinking and positive change discussed. Pt still wants reassurance that doors to facility are locked and there is only badge or code entry.   Shellia Cleverly 06/01/2020, 8:13 PM

## 2020-06-01 NOTE — Plan of Care (Signed)
Nurse discussed anxiety, depression and coping skills with patient.  

## 2020-06-01 NOTE — Progress Notes (Signed)
Pt seen at the nurse's station. Up checking to be sure that the doors are secure. Pt assured that doors are secure and no one from the outside can enter without [place] knowing. Pt redirected to return to her room. Will continue to monitor.

## 2020-06-01 NOTE — Progress Notes (Addendum)
   06/01/20 2000  Psych Admission Type (Psych Patients Only)  Admission Status Voluntary  Psychosocial Assessment  Patient Complaints Anxiety  Eye Contact Fair  Facial Expression Flat  Affect Anxious;Preoccupied;Fearful  Speech Soft  Interaction Assertive  Motor Activity Fidgety  Appearance/Hygiene Unremarkable  Behavior Characteristics Anxious;Cooperative  Mood Anxious;Pleasant  Thought Process  Coherency WDL  Content Paranoia  Delusions Persecutory;Paranoid  Perception Hallucinations  Hallucination Auditory  Judgment Impaired  Confusion None  Danger to Self  Current suicidal ideation? Denies  Danger to Others  Danger to Others None reported or observed   Pt seen walking from door to door seeing if they are secure. "I want to make sure that the cartels can't get to me. My nurse earlier told me that the doors couldn't be opened but I wanted to make sure." This writer assured pt that doors could only be opened with a badge and that anyone couldn't access it. Pt rated anxiety 8/10. Pt endorses AH as well.

## 2020-06-02 ENCOUNTER — Other Ambulatory Visit: Payer: Self-pay

## 2020-06-02 LAB — GLUCOSE, CAPILLARY: Glucose-Capillary: 109 mg/dL — ABNORMAL HIGH (ref 70–99)

## 2020-06-02 MED ORDER — RISPERIDONE 3 MG PO TABS
3.0000 mg | ORAL_TABLET | Freq: Every day | ORAL | Status: DC
  Administered 2020-06-02 – 2020-06-03 (×2): 3 mg via ORAL
  Filled 2020-06-02 (×3): qty 1

## 2020-06-02 NOTE — Progress Notes (Signed)
EKG TO BE DONE Tuesday, June 03, 2020.

## 2020-06-02 NOTE — Progress Notes (Signed)
The patient's positive event for the day is that she was able to work on word search puzzles. She also stated that she does not want to be transferred to the 500 hallway. Her goal for tomorrow is to "keep in control".

## 2020-06-02 NOTE — Plan of Care (Signed)
Nurse discussed anxiety, depression and coping skills with patient.  

## 2020-06-02 NOTE — Progress Notes (Signed)
Pt tolerated Trazodone well last night. Stated that it helped her sleep.

## 2020-06-02 NOTE — Progress Notes (Addendum)
   06/02/20 2000  Psych Admission Type (Psych Patients Only)  Admission Status Involuntary  Psychosocial Assessment  Patient Complaints Anxiety;Agitation;Crying spells  Eye Contact Brief  Facial Expression Anxious  Affect Anxious;Preoccupied;Fearful  Speech Soft  Interaction Assertive  Motor Activity Fidgety  Appearance/Hygiene Unremarkable  Behavior Characteristics Anxious;Agitated  Thought Process  Coherency WDL  Content Paranoia  Delusions Persecutory;Paranoid  Perception Hallucinations  Hallucination Auditory;Command  Judgment Impaired  Confusion None  Danger to Self  Current suicidal ideation? Denies  Danger to Others  Danger to Others None reported or observed   Pt sitting in dayroom working on a word search puzzle and talking to herself. Pt denies SI but says that her AH, [place], wants her to put her head in a toilet and kill herself. Pt states that she doesn't "feel herself" today and that the voices are louder and commanding. "The tech told me to work on these puzzles and concentrate on the words and not the voices. I'm doing the puzzles." Pt began to fidget and rock back and forth, agitated. Pt told that we would be starting a close observation to keep her safe. Pt just kept apologizing and crying, "I'll try to be good and quiet." Pt assured that she wasn't in trouble but we were concerned about safety. Pt rates anxiety 9/10.

## 2020-06-02 NOTE — Progress Notes (Signed)
D:  Patient's self inventory sheet, patient sleeps good, sleep medication helpful.  Fair appetite, low energy level, good concentration.  Rated depression and hopeless 7, anxiety 8.  Denied withdrawals. SI, contracts for safety.  Denied physical problems.  Denied physical pain.  Goal is get medicines right.  Plans to talk to MD.  No discharge plans. A:  Medications administered per MD orders.  Emotional support and encouragement given patient. R:  Denied SI and HI, contracts for safety.  Denied A/V hallucinations.  Safety maintained with 15 minute checks.

## 2020-06-02 NOTE — Progress Notes (Signed)
Recreation Therapy Notes  Date:  6.7.21 Time: 0930 Location: 300 Hall Group Room  Group Topic: Stress Management  Goal Area(s) Addresses:  Patient will identify positive stress management techniques. Patient will identify benefits of using stress management post d/c.  Behavioral Response: Engaged  Intervention: Stress Management  Activity: Meditation.  LRT played a meditation that focused on embracing change.  Patients were to listen and follow along as meditation was read to engage in activity.    Education:  Stress Management, Discharge Planning.   Education Outcome: Acknowledges Education  Clinical Observations/Feedback: Pt attended and participated in activity.    Victorino Sparrow, LRT/CTRS    Ria Comment, Kameo Bains A 06/02/2020 11:02 AM

## 2020-06-02 NOTE — [facility] Counselor (Signed)
Patient asked CSW and NP during treatment team if the facility is locked. CSW confirmed [facility] is a locked facility. Patient asked if that meant "the cartel can't come in." CSW assured patient that the cartel would not be allowed in and shared that there is a Animal nutritionist at the front door. Patient verbalized knowledge that this information was reassuring.  Stephanie Acre, MSW, LCSW-A Clinical Social Worker [facility]

## 2020-06-02 NOTE — Tx Team (Signed)
Interdisciplinary Treatment and Diagnostic Plan Update  06/02/2020 Time of Session: 9:00am [name] [name] MRN: [id number]  Principal Diagnosis: Schizoaffective disorder, depressive type [facility])  Secondary Diagnoses: Principal Problem:   Schizoaffective disorder, depressive type ([place]) Active Problems:   Schizophrenia ([place])   Current Medications:  Current Facility-Administered Medications  Medication Dose Route Frequency Provider Last Rate Last Admin  . acetaminophen (TYLENOL) tablet 650 mg  650 mg Oral Q6H PRN Anike, Adaku C, NP      . alum & mag hydroxide-simeth (MAALOX/MYLANTA) 200-200-20 MG/5ML suspension 30 mL  30 mL Oral Q4H PRN Anike, Adaku C, NP      . busPIRone (BUSPAR) tablet 15 mg  15 mg Oral TID Sharma Covert, MD   15 mg at 06/02/20 0730  . carbamazepine (TEGRETOL) chewable tablet 200 mg  200 mg Oral BID Sharma Covert, MD   200 mg at 06/02/20 0730  . gabapentin (NEURONTIN) capsule 400 mg  400 mg Oral TID Sharma Covert, MD   400 mg at 06/02/20 0731  . hydrOXYzine (ATARAX/VISTARIL) tablet 25 mg  25 mg Oral Q4H PRN Lindell Spar I, NP   25 mg at 06/01/20 2117  . magnesium hydroxide (MILK OF MAGNESIA) suspension 30 mL  30 mL Oral Daily PRN Anike, Adaku C, NP      . magnesium oxide (MAG-OX) tablet 400 mg  400 mg Oral QHS Sharma Covert, MD   400 mg at 06/01/20 2117  . meloxicam (MOBIC) tablet 15 mg  15 mg Oral Daily Sharma Covert, MD   15 mg at 06/02/20 0731  . prazosin (MINIPRESS) capsule 2 mg  2 mg Oral QHS Sharma Covert, MD   2 mg at 06/01/20 2117  . risperiDONE (RISPERDAL) tablet 1 mg  1 mg Oral Daily Nwoko, Agnes I, NP   1 mg at 06/02/20 0731  . risperiDONE (RISPERDAL) tablet 2 mg  2 mg Oral QHS Lindell Spar I, NP   2 mg at 06/01/20 2117  . rOPINIRole (REQUIP XL) 24 hr tablet 4 mg  4 mg Oral QHS Sharma Covert, MD   4 mg at 06/01/20 2117  . traZODone (DESYREL) tablet 50 mg  50 mg Oral QHS PRN Rozetta Nunnery, NP   50 mg at 06/01/20 2117   PTA  Medications: Medications Prior to Admission  Medication Sig Dispense Refill Last Dose  . busPIRone (BUSPAR) 15 MG tablet Take 1 tablet (15 mg total) by mouth 2 (two) times daily. For anxiety 60 tablet 0   . gabapentin (NEURONTIN) 400 MG capsule Take 1 capsule (400 mg total) by mouth at bedtime. For agitation 30 capsule 0   . hydrOXYzine (ATARAX/VISTARIL) 25 MG tablet Take 1 tablet (25 mg total) by mouth every 6 (six) hours as needed for anxiety. 75 tablet 0   . meloxicam (MOBIC) 15 MG tablet Take 1 tablet (15 mg total) by mouth daily. 30 tablet 1   . OLANZapine (ZYPREXA) 10 MG tablet Take 1 tablet (10 mg total) by mouth at bedtime. For mood control 30 tablet 0   . Oxcarbazepine (TRILEPTAL) 300 MG tablet Take 1 tablet (300 mg total) by mouth 2 (two) times daily. For mood stabilization 60 tablet 0   . pantoprazole (PROTONIX) 20 MG tablet Take 1 tablet (20 mg total) by mouth daily. For acid reflux 15 tablet 0   . prazosin (MINIPRESS) 2 MG capsule Take 1 capsule (2 mg total) by mouth at bedtime. For nightmares 30 capsule 0   .  QUEtiapine (SEROQUEL) 400 MG tablet Take 1 tablet (400 mg total) by mouth at bedtime. For mood control 30 tablet 0     Patient Stressors:    Patient Strengths:    Treatment Modalities: Medication Management, Group therapy, Case management,  1 to 1 session with clinician, Psychoeducation, Recreational therapy.   Physician Treatment Plan for Primary Diagnosis: Schizoaffective disorder, depressive type ([place]) Long Term Goal(s): Improvement in symptoms so as ready for discharge Improvement in symptoms so as ready for discharge   Short Term Goals: Ability to identify changes in lifestyle to reduce recurrence of condition will improve Ability to verbalize feelings will improve Ability to disclose and discuss suicidal ideas Ability to demonstrate self-control will improve Ability to identify and develop effective coping behaviors will improve Ability to maintain clinical  measurements within normal limits will improve Compliance with prescribed medications will improve Ability to identify changes in lifestyle to reduce recurrence of condition will improve Ability to verbalize feelings will improve Ability to disclose and discuss suicidal ideas Ability to demonstrate self-control will improve Ability to identify and develop effective coping behaviors will improve Ability to maintain clinical measurements within normal limits will improve Compliance with prescribed medications will improve  Medication Management: Evaluate patient's response, side effects, and tolerance of medication regimen.  Therapeutic Interventions: 1 to 1 sessions, Unit Group sessions and Medication administration.  Evaluation of Outcomes: Progressing  Physician Treatment Plan for Secondary Diagnosis: Principal Problem:   Schizoaffective disorder, depressive type ([place]) Active Problems:   Schizophrenia ([place])  Long Term Goal(s): Improvement in symptoms so as ready for discharge Improvement in symptoms so as ready for discharge   Short Term Goals: Ability to identify changes in lifestyle to reduce recurrence of condition will improve Ability to verbalize feelings will improve Ability to disclose and discuss suicidal ideas Ability to demonstrate self-control will improve Ability to identify and develop effective coping behaviors will improve Ability to maintain clinical measurements within normal limits will improve Compliance with prescribed medications will improve Ability to identify changes in lifestyle to reduce recurrence of condition will improve Ability to verbalize feelings will improve Ability to disclose and discuss suicidal ideas Ability to demonstrate self-control will improve Ability to identify and develop effective coping behaviors will improve Ability to maintain clinical measurements within normal limits will improve Compliance with prescribed medications will  improve     Medication Management: Evaluate patient's response, side effects, and tolerance of medication regimen.  Therapeutic Interventions: 1 to 1 sessions, Unit Group sessions and Medication administration.  Evaluation of Outcomes: Progressing   RN Treatment Plan for Primary Diagnosis: Schizoaffective disorder, depressive type ([place]) Long Term Goal(s): Knowledge of disease and therapeutic regimen to maintain health will improve  Short Term Goals: Ability to participate in decision making will improve, Ability to verbalize feelings will improve and Ability to identify and develop effective coping behaviors will improve  Medication Management: RN will administer medications as ordered by provider, will assess and evaluate patient's response and provide education to patient for prescribed medication. RN will report any adverse and/or side effects to prescribing provider.  Therapeutic Interventions: 1 on 1 counseling sessions, Psychoeducation, Medication administration, Evaluate responses to treatment, Monitor vital signs and CBGs as ordered, Perform/monitor CIWA, COWS, AIMS and Fall Risk screenings as ordered, Perform wound care treatments as ordered.  Evaluation of Outcomes: Progressing   LCSW Treatment Plan for Primary Diagnosis: Schizoaffective disorder, depressive type ([place]) Long Term Goal(s): Safe transition to appropriate next level of care at discharge, Engage patient in  therapeutic group addressing interpersonal concerns.  Short Term Goals: Engage patient in aftercare planning with referrals and resources, Increase social support, Increase emotional regulation, Identify triggers associated with mental health/substance abuse issues and Increase skills for wellness and recovery  Therapeutic Interventions: Assess for all discharge needs, 1 to 1 time with Social worker, Explore available resources and support systems, Assess for adequacy in community support network, Educate family and  significant other(s) on suicide prevention, Complete Psychosocial Assessment, Interpersonal group therapy.  Evaluation of Outcomes: Progressing  Progress in Treatment: Attending groups: Yes. Participating in groups: Yes. Taking medication as prescribed: Yes. Toleration medication: Yes. Family/Significant other contact made: No, will contact:  declined consents, SPE reviewed with patient. Patient understands diagnosis: Yes. Discussing patient identified problems/goals with staff: Yes. Medical problems stabilized or resolved: Yes. Denies suicidal/homicidal ideation: Yes. Issues/concerns per patient self-inventory: Yes.  New problem(s) identified: Yes, Describe:  limited social and family supports.  New Short Term/Long Term Goal(s): medication management for mood stabilization; elimination of SI thoughts; development of comprehensive mental wellness/sobriety plan.  Patient Goals: "Changing my medicine."  Discharge Plan or Barriers: CSW continuing to assess for appropriate referrals.  Reason for Continuation of Hospitalization: Anxiety Delusions  Depression Medication stabilization  Estimated Length of Stay: 3-5 days  Attendees: Patient: [name] [name] 06/02/2020 9:24 AM  Physician:  06/02/2020 9:24 AM  Nursing:  06/02/2020 9:24 AM  RN Care Manager: 06/02/2020 9:24 AM  Social Worker: Stephanie Acre, [place] 06/02/2020 9:24 AM  Recreational Therapist:  06/02/2020 9:24 AM  Other: Harriett Sine, NP 06/02/2020 9:24 AM  Other:  06/02/2020 9:24 AM  Other: 06/02/2020 9:24 AM    Scribe for Treatment Team: Joellen Jersey, [place] 06/02/2020 9:24 AM

## 2020-06-02 NOTE — Progress Notes (Signed)
Told by MHT from off-going shift that pt was hearing voices telling her to put her head in the toilet and kill herself. Day shift RN went to speak with pt. Pt agitated and responding to internal stimuli. This Probation officer went to pt room and witnessed pt speaking to her AH ("[place]"). It was also reported that pt was standing in the bathroom at the toilet. For pt safety, it was discussed between this Probation officer and day shift RN that pt should be on close observation. Instituted close observation for this pt to maintain safety on the unit.
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
## 2020-06-03 LAB — GLUCOSE, CAPILLARY: Glucose-Capillary: 103 mg/dL — ABNORMAL HIGH (ref 70–99)

## 2020-06-03 NOTE — Progress Notes (Signed)
D: Pt endorses anxiety and depression.  Denies feelings suicidal or homicidal.  Pt had episode of vomiting this morning. A: RN actively listened, provided support and administered pt her medications.   R: Pt's nausea resolved and she is calm at this time.  Pt observed in dayroom, interacting with other patients.  Q 15 min safety checks remain in place.

## 2020-06-03 NOTE — Progress Notes (Signed)
Nursing 1:1 close observation note D:Pt in bed sleeping. RR even and unlabored. No distress noted. A: 1:1 close observation continues for safety  R: pt remains safe

## 2020-06-03 NOTE — Progress Notes (Addendum)
Nursing 1:1 Close observation note: D:Pt observed in dayroom. RR even and unlabored. No distress noted.  A: 1:1 close observation continues for safety  R: Pt remains safe

## 2020-06-03 NOTE — Progress Notes (Signed)
The patient verbalized in group that she had a good night and an "okay day". She mentioned briefly mentioned that she vomited earlier in the day. Her goal for tomorrow is to get discharged.

## 2020-06-03 NOTE — Progress Notes (Signed)
   06/03/20 2115  Psych Admission Type (Psych Patients Only)  Admission Status Involuntary  Psychosocial Assessment  Patient Complaints None  Eye Contact Brief  Facial Expression Anxious  Affect Anxious;Preoccupied  Speech Soft  Interaction Assertive  Motor Activity Fidgety  Appearance/Hygiene Unremarkable  Behavior Characteristics Cooperative  Mood Pleasant  Thought Process  Coherency WDL  Content WDL  Delusions WDL  Perception Hallucinations  Hallucination Auditory  Judgment Impaired  Confusion None  Danger to Self  Current suicidal ideation? Denies  Danger to Others  Danger to Others None reported or observed   Pt states that she had a good day today. Pt denies anxiety, SI, HI, VH and pain. Pt endorses AH but states that it is just "chatter" in the background and she can push it away.

## 2020-06-03 NOTE — Progress Notes (Signed)
Nursing 1:1 close observation note D:Pt observed sitting in dayroom. RR even and unlabored. No distress noted. A: 1:1 close observation continues for safety  R: pt remains safe

## 2020-06-03 NOTE — Progress Notes (Signed)
Recreation Therapy Notes  Animal-Assisted Activity (AAA) Program Checklist/Progress Notes Patient Eligibility Criteria Checklist & Daily Group note for Rec Tx Intervention  Date: 6.8.21 Time: 30 Location: [address]   AAA/T Program Assumption of Risk Form signed by Teacher, music or Parent Legal Guardian  YES   Patient is free of allergies or sever asthma  YES   Patient reports no fear of animals YES   Patient reports no history of cruelty to animals YES   Patient understands his/her participation is voluntary  YES   Patient washes hands before animal contact  YES   Patient washes hands after animal contact  YES   Behavioral Response: Engaged  Education: Contractor, Appropriate Animal Interaction   Education Outcome: Acknowledges understanding/In group clarification offered/Needs additional education.   Clinical Observations/Feedback: Pt attended and participated in group activity.    Victorino Sparrow, LRT/CTRS    Victorino Sparrow A 06/03/2020 3:28 PM

## 2020-06-03 NOTE — Progress Notes (Signed)
Pt states that she is feeling much better today. States that she feels much calmer. States that she doesn't hear "[place]" speaking to her anymore.

## 2020-06-03 NOTE — Progress Notes (Signed)
Spiritual care group on grief and loss facilitated by chaplain Jerene Pitch MDiv, BCC  Group Goal:  Support / Education around grief and loss Members engage in facilitated group support and psycho-social education.  Group Description:  Following introductions and group rules, group members engaged in facilitated group dialog and support around topic of loss, with particular support around experiences of loss in their lives. Group Identified types of loss (relationships / self / things) and identified patterns, circumstances, and changes that precipitate losses. Reflected on thoughts / feelings around loss, normalized grief responses, and recognized variety in grief experience.   Group noted Wordens four tasks of grief in discussion.  Group drew on Adlerian / Rogerian, narrative, MI, Patient Progress:  [name] [name] was present through 30 of group.  Left room briefly and returned.  [name] [name] offered feedback to another group member - noting that "acceptance" was a helpful concept for [name] [name].  She described "having to accept that something is happening in order to engage it rather than avoid it."   She did not engage this topic further.   She interjected at end of group to note that it was time for lunch.
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
## 2020-06-04 MED ORDER — RISPERIDONE 3 MG PO TABS: 3.0000 mg | ORAL_TABLET | Freq: Every day | ORAL | 0 refills | Status: DC

## 2020-06-04 MED ORDER — TRAZODONE HCL 50 MG PO TABS: 50.0000 mg | ORAL_TABLET | Freq: Every evening | ORAL | 0 refills | Status: DC | PRN

## 2020-06-04 MED ORDER — GABAPENTIN 400 MG PO CAPS: 400.0000 mg | ORAL_CAPSULE | Freq: Three times a day (TID) | ORAL | 0 refills | Status: DC

## 2020-06-04 MED ORDER — PRAZOSIN HCL 2 MG PO CAPS: 2.0000 mg | ORAL_CAPSULE | Freq: Every day | ORAL | 0 refills | Status: DC

## 2020-06-04 MED ORDER — BUSPIRONE HCL 15 MG PO TABS: 15.0000 mg | ORAL_TABLET | Freq: Three times a day (TID) | ORAL | 0 refills | Status: DC

## 2020-06-04 MED ORDER — HYDROXYZINE HCL 25 MG PO TABS: 25.0000 mg | ORAL_TABLET | ORAL | 0 refills | Status: DC | PRN

## 2020-06-04 MED ORDER — ROPINIROLE HCL ER 4 MG PO TB24: 4.0000 mg | ORAL_TABLET | Freq: Every day | ORAL | 0 refills | Status: DC

## 2020-06-04 MED ORDER — CARBAMAZEPINE 100 MG PO CHEW: 200.0000 mg | CHEWABLE_TABLET | Freq: Two times a day (BID) | ORAL | 0 refills | Status: DC

## 2020-06-04 MED ORDER — WHITE PETROLATUM EX OINT
TOPICAL_OINTMENT | CUTANEOUS | Status: AC
  Filled 2020-06-04: qty 5

## 2020-06-04 MED ORDER — RISPERIDONE 1 MG PO TABS: 1.0000 mg | ORAL_TABLET | Freq: Every day | ORAL | 0 refills | Status: DC

## 2020-06-04 NOTE — [facility] Suicide Risk Assessment (Signed)
[facility] Discharge Suicide Risk Assessment   Principal Problem: Schizoaffective disorder, depressive type ([place]) Discharge Diagnoses: Principal Problem:   Schizoaffective disorder, depressive type ([place]) Active Problems:   Schizophrenia ([place])   Total Time spent with patient: 30 minutes  Musculoskeletal: Strength & Muscle Tone: within normal limits Gait & Station: normal Patient leans: N/A  Psychiatric Specialty Exam: Review of Systems no headache, no chest pain, no shortness of breath, no vomiting , no rash   Blood pressure 106/73, pulse 94, temperature 98.1 F (36.7 C), temperature source Oral, resp. rate 16, height 5\' 3"  (1.6 m), weight 95.3 kg, SpO2 97 %.Body mass index is 37.2 kg/m.  General Appearance: Well Groomed  Eye Contact::  Good  Speech:  Normal Rate409  Volume:  Normal  Mood:  reports mood is improved and states " I am in a good mood"  Affect:  Appropriate and Full Range  Thought Process:  Linear and Descriptions of Associations: Intact  Orientation:  Full (Time, Place, and Person)  Thought Content:  reports she hears " [place]" all the time, even when doing well,but states " it is much quieter now, like chatter in the background, and I can push it away". Does not appear internally preoccupied. No delusions currently expressed - denies command hallucinations  Suicidal Thoughts:  No denies suicidal ideations, no self injurious ideations, no homicidal ideations  Homicidal Thoughts:  No  Memory:  recent and remote grossly intact   Judgement:  Other:  improving   Insight:  improving   Psychomotor Activity:  Normal  Concentration:  Good  Recall:  Good  Fund of Knowledge:Good  Language: Good  Akathisia:  Negative  Handed:  Right  AIMS (if indicated):     Assets:  Communication Skills Desire for Improvement Resilience  Sleep:  Number of Hours: 5.75  Cognition: WNL  ADL's:  Intact   Mental Status Per Nursing Assessment::   On Admission:  Self-harm  thoughts  Demographic Factors:  5, lives with mother and stepfather, on disability  Loss Factors: relationnship stressors with daughter, history of chronic mental illness    Historical Factors: History of prior psychiatric admissions , has been diagnosed with Schizoaffective Disorder , past history of suicide attempts   Risk Reduction Factors:   Sense of responsibility to family, Living with another person, especially a relative and Positive coping skills or problem solving skills  Continued Clinical Symptoms:  At this time patient is alert, attentive, well groomed, calm without psychomotor restlessness, mood described as much improved, affect appropriate, no thought disorder, describes chronic/persistent  Hallucinations that do not fully resolve  ( " [place]" ) but states these have improved and become distant and easy to ignore . No delusions expressed. Not internally preoccupied at this time. Denies medication side effects , which we reviewed , including potential risk of severe rash and hematologic side effects on Carbamazepine, and importance of this medication being monitored by her outpatient provider , and potential risk of metabolic , motor side effects on Risperidone, to include TD  Behavior on unit in good control.   Cognitive Features That Contribute To Risk:  No gross cognitive deficits noted upon discharge. Is alert , attentive, and oriented x 3    Suicide Risk:  Mild:  Suicidal ideation of limited frequency, intensity, duration, and specificity.  There are no identifiable plans, no associated intent, mild dysphoria and related symptoms, good self-control (both objective and subjective assessment), few other risk factors, and identifiable protective factors, including available and accessible social support.  Badger, Best boy. Go on 06/06/2020.   Why: You have an appointment on 06/06/20 at 8:30 am for assessment.  At this time, the  provider will discuss medication management, therapy and need for ACTT services.  This appointment will be held in person.   Contact information: [place] [id number] [phone]           Plan Of Care/Follow-up recommendations:  Activity:  as tolerated  Diet:  regular Tests:  NA Other:  See below  Patient is expressing readiness for discharge and is leaving in good spirits . Plans to return home- mother will pick her up later today Follow up as above . Also has established PCP for medical management as needed .   Jenne Campus, MD 06/04/2020, 7:27 AM

## 2020-06-04 NOTE — Progress Notes (Signed)
Discharge Note:  Patient discharged via Safe Transport to home.  Patient denied SI and HI.  Denied A/V hallucinations.  Denied pain.  Suicide prevention information given and discussed with patient who stated she understood and had no questions.  Patient stated she received all her belongings, clothing, toiletries, misc items, etc.  Patient stated she appreciated all assistance received from [facility] staff.  All required discharge information given to patient at discharge.
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
## 2020-06-04 NOTE — Progress Notes (Signed)
  [facility] Adult Case Management Discharge Plan :  Will you be returning to the same living situation after discharge:  Yes,  patient is returning home with her parents At discharge, do you have transportation home?: Yes,  CSW arranging Safe Transport Do you have the ability to pay for your medications: Yes,  [facility] Medicare  Release of information consent forms completed and in the chart;  Patient's signature needed at discharge.  Patient to Follow up at: [place], [facility]. Go on 06/06/2020.   Why: You have an appointment on 06/06/20 at 8:30 am for assessment.  At this time, the provider will discuss medication management, therapy and need for ACTT services.  This appointment will be held in person. Please have your discharge paperwork available.   Contact information: [place] [address] [phone]           Next level of care provider has access to [place] and Suicide Prevention discussed: Yes,  with the patient     Has patient been referred to the Quitline?: Patient refused referral  Patient has been referred for addiction treatment: N/A  Marylee Floras, [place] 06/04/2020, 10:25 AM

## 2020-06-04 NOTE — Progress Notes (Signed)
D:  Patient's self inventory sheet, patient sleeps good, sleep medication helpful.  Good appetite, normal energy level, good concentration.  Rated depression 2, hopeless 1, anxiety 4.  Denied withdrawals.  Denied SI.  Denied physical problems.  Denied physical pain.  Goal is get meds from pharmacy.  Plans to attend church.  "Have a blessed day."  A:  Medications administered per MD orders.  Emotional support and encouragement given patient. R:  Denied SI and HI, contracts for safety.  Denied visual hallucinations.  Voices from [place], manageable, directing her.

## 2020-06-10 ENCOUNTER — Encounter (HOSPITAL_COMMUNITY): Payer: Self-pay | Admitting: Emergency Medicine

## 2020-06-10 ENCOUNTER — Other Ambulatory Visit: Payer: Self-pay

## 2020-06-10 ENCOUNTER — Emergency Department (HOSPITAL_COMMUNITY)
Admission: EM | Admit: 2020-06-10 | Discharge: 2020-06-10 | Disposition: A | Discharge status: Home / Self Care | Payer: Medicare Other | Attending: Emergency Medicine | Admitting: Emergency Medicine

## 2020-06-10 DIAGNOSIS — Z86711 Personal history of pulmonary embolism: Secondary | ICD-10-CM | POA: Diagnosis not present

## 2020-06-10 DIAGNOSIS — Z79899 Other long term (current) drug therapy: Secondary | ICD-10-CM | POA: Diagnosis not present

## 2020-06-10 DIAGNOSIS — F17210 Nicotine dependence, cigarettes, uncomplicated: Secondary | ICD-10-CM | POA: Insufficient documentation

## 2020-06-10 DIAGNOSIS — R600 Localized edema: Secondary | ICD-10-CM | POA: Diagnosis not present

## 2020-06-10 DIAGNOSIS — I10 Essential (primary) hypertension: Secondary | ICD-10-CM | POA: Diagnosis not present

## 2020-06-10 DIAGNOSIS — M7989 Other specified soft tissue disorders: Secondary | ICD-10-CM | POA: Diagnosis not present

## 2020-06-10 DIAGNOSIS — Z85828 Personal history of other malignant neoplasm of skin: Secondary | ICD-10-CM | POA: Insufficient documentation

## 2020-06-10 LAB — BASIC METABOLIC PANEL
Anion gap: 7 (ref 5–15)
BUN: 8 mg/dL (ref 6–20)
CO2: 25 mmol/L (ref 22–32)
Calcium: 9 mg/dL (ref 8.9–10.3)
Chloride: 109 mmol/L (ref 98–111)
Creatinine, Ser: 0.81 mg/dL (ref 0.44–1.00)
GFR calc Af Amer: >60 mL/min (ref >60)
GFR calc non Af Amer: >60 mL/min (ref >60)
Glucose, Bld: 79 mg/dL (ref 70–99)
Potassium: 4.3 mmol/L (ref 3.5–5.1)
Sodium: 141 mmol/L (ref 135–145)

## 2020-06-10 LAB — CBC WITH DIFFERENTIAL/PLATELET
Abs Immature Granulocytes: 0.04 10*3/uL (ref 0.00–0.07)
Basophils Absolute: 0.1 10*3/uL (ref 0.0–0.1)
Basophils Relative: 1 %
Eosinophils Absolute: 0.1 10*3/uL (ref 0.0–0.5)
Eosinophils Relative: 2 %
HCT: 37.5 % (ref 36.0–46.0)
Hemoglobin: 11.3 g/dL — ABNORMAL LOW (ref 12.0–15.0)
Immature Granulocytes: 1 %
Lymphocytes Relative: 23 %
Lymphs Abs: 1.2 10*3/uL (ref 0.7–4.0)
MCH: 26.2 pg (ref 26.0–34.0)
MCHC: 30.1 g/dL (ref 30.0–36.0)
MCV: 87 fL (ref 80.0–100.0)
Monocytes Absolute: 0.5 10*3/uL (ref 0.1–1.0)
Monocytes Relative: 10 %
Neutro Abs: 3.3 10*3/uL (ref 1.7–7.7)
Neutrophils Relative %: 63 %
Platelets: 315 10*3/uL (ref 150–400)
RBC: 4.31 MIL/uL (ref 3.87–5.11)
RDW: 15.6 % — ABNORMAL HIGH (ref 11.5–15.5)
WBC: 5.2 10*3/uL (ref 4.0–10.5)
nRBC: 0 % (ref 0.0–0.2)

## 2020-06-10 LAB — BRAIN NATRIURETIC PEPTIDE: B Natriuretic Peptide: 46.3 pg/mL (ref 0.0–100.0)

## 2020-06-10 LAB — URINALYSIS, ROUTINE W REFLEX MICROSCOPIC
Bilirubin Urine: NEGATIVE
Glucose, UA: 50 mg/dL — AB
Hgb urine dipstick: NEGATIVE
Ketones, ur: NEGATIVE mg/dL
Leukocytes,Ua: NEGATIVE
Nitrite: NEGATIVE
Protein, ur: NEGATIVE mg/dL
Specific Gravity, Urine: 1.012 (ref 1.005–1.030)
pH: 6 (ref 5.0–8.0)

## 2020-06-10 LAB — HEPATIC FUNCTION PANEL
ALT: 11 U/L (ref 0–44)
AST: 12 U/L — ABNORMAL LOW (ref 15–41)
Albumin: 3.4 g/dL — ABNORMAL LOW (ref 3.5–5.0)
Alkaline Phosphatase: 84 U/L (ref 38–126)
Bilirubin, Direct: <0.1 mg/dL (ref 0.0–0.2)
Total Bilirubin: 0.4 mg/dL (ref 0.3–1.2)
Total Protein: 6.3 g/dL — ABNORMAL LOW (ref 6.5–8.1)

## 2020-06-10 MED ORDER — KETOROLAC TROMETHAMINE 15 MG/ML IJ SOLN
15.0000 mg | Freq: Once | INTRAMUSCULAR | Status: AC
  Administered 2020-06-10: 15 mg via INTRAMUSCULAR
  Filled 2020-06-10: qty 1

## 2020-06-10 NOTE — Discharge Instructions (Addendum)
Your laboratory results are within normal limits today.  You may alternate ibuprofen or Tylenol to help with your leg pain.  Follow-up with your primary care physician as needed.  You may also elevate your legs at the end of the day in order to help with swelling and pain.

## 2020-06-10 NOTE — ED Triage Notes (Signed)
Pt. Stated, My Dr. Durene Cal me over , Im having swelling in my legs , this started on Saturday.
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
## 2020-06-17 ENCOUNTER — Ambulatory Visit (HOSPITAL_COMMUNITY)
Admission: EM | Admit: 2020-06-17 | Discharge: 2020-06-17 | Disposition: A | Discharge status: Home / Self Care | Payer: Medicare Other | Attending: Physician Assistant | Admitting: Physician Assistant

## 2020-06-17 DIAGNOSIS — R300 Dysuria: Secondary | ICD-10-CM

## 2020-06-17 DIAGNOSIS — N3289 Other specified disorders of bladder: Secondary | ICD-10-CM | POA: Diagnosis not present

## 2020-06-17 DIAGNOSIS — R1030 Lower abdominal pain, unspecified: Secondary | ICD-10-CM | POA: Diagnosis not present

## 2020-06-17 LAB — POCT URINALYSIS DIP (DEVICE)
Bilirubin Urine: NEGATIVE
Glucose, UA: 100 mg/dL — AB
Hgb urine dipstick: NEGATIVE
Ketones, ur: NEGATIVE mg/dL
Leukocytes,Ua: NEGATIVE
Nitrite: NEGATIVE
Protein, ur: NEGATIVE mg/dL
Specific Gravity, Urine: >=1.03 (ref 1.005–1.030)
Urobilinogen, UA: 0.2 mg/dL (ref 0.0–1.0)
pH: 6.5 (ref 5.0–8.0)

## 2020-06-17 NOTE — ED Triage Notes (Signed)
Pt c/o acute pelvic/suprabubic pain cramping in nature that awakened her yesterday morning at 0400. Pt also reports urinary urgency. Denies flank pain, n/v/d, fever, chills, hematuria or burning with urination, vaginal discharge or abnormal vag bleeding.
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
## 2020-06-17 NOTE — Discharge Instructions (Addendum)
Your urine did not show sign of infection We have sent a culture  I believe you should see a urologist, I have supplied an option for this. Please also call your PCP today to discuss  If pain becomes persistent and more severe or you are unable to urinate , go to the Emergency Department

## 2020-06-18 LAB — URINE CULTURE

## 2020-09-05 ENCOUNTER — Encounter (HOSPITAL_BASED_OUTPATIENT_CLINIC_OR_DEPARTMENT_OTHER): Payer: Self-pay | Admitting: Emergency Medicine

## 2020-09-05 ENCOUNTER — Other Ambulatory Visit: Payer: Self-pay

## 2020-09-05 DIAGNOSIS — G43901 Migraine, unspecified, not intractable, with status migrainosus: Secondary | ICD-10-CM | POA: Insufficient documentation

## 2020-09-05 DIAGNOSIS — Z79899 Other long term (current) drug therapy: Secondary | ICD-10-CM | POA: Diagnosis not present

## 2020-09-05 DIAGNOSIS — I10 Essential (primary) hypertension: Secondary | ICD-10-CM | POA: Insufficient documentation

## 2020-09-05 DIAGNOSIS — F17200 Nicotine dependence, unspecified, uncomplicated: Secondary | ICD-10-CM | POA: Diagnosis not present

## 2020-09-05 NOTE — ED Triage Notes (Addendum)
Pt sent for potassium of 7.5. pt was being treated for migraine and received Imitrex without relief.

## 2020-09-06 ENCOUNTER — Emergency Department (HOSPITAL_BASED_OUTPATIENT_CLINIC_OR_DEPARTMENT_OTHER): Payer: Medicare Other

## 2020-09-06 ENCOUNTER — Emergency Department (HOSPITAL_BASED_OUTPATIENT_CLINIC_OR_DEPARTMENT_OTHER)
Admission: EM | Admit: 2020-09-06 | Discharge: 2020-09-06 | Disposition: A | Discharge status: Home / Self Care | Payer: Medicare Other | Attending: Emergency Medicine | Admitting: Emergency Medicine

## 2020-09-06 DIAGNOSIS — G43901 Migraine, unspecified, not intractable, with status migrainosus: Secondary | ICD-10-CM

## 2020-09-06 LAB — BASIC METABOLIC PANEL
Anion gap: 8 (ref 5–15)
BUN: 18 mg/dL (ref 6–20)
CO2: 27 mmol/L (ref 22–32)
Calcium: 9 mg/dL (ref 8.9–10.3)
Chloride: 104 mmol/L (ref 98–111)
Creatinine, Ser: 0.86 mg/dL (ref 0.44–1.00)
GFR calc Af Amer: >60 mL/min (ref >60)
GFR calc non Af Amer: >60 mL/min (ref >60)
Glucose, Bld: 107 mg/dL — ABNORMAL HIGH (ref 70–99)
Potassium: 3.9 mmol/L (ref 3.5–5.1)
Sodium: 139 mmol/L (ref 135–145)

## 2020-09-06 MED ORDER — DIPHENHYDRAMINE HCL 50 MG/ML IJ SOLN
25.0000 mg | Freq: Once | INTRAMUSCULAR | Status: AC
  Administered 2020-09-06: 25 mg via INTRAVENOUS
  Filled 2020-09-06: qty 1

## 2020-09-06 MED ORDER — KETOROLAC TROMETHAMINE 15 MG/ML IJ SOLN
15.0000 mg | Freq: Once | INTRAMUSCULAR | Status: AC
  Administered 2020-09-06: 15 mg via INTRAVENOUS
  Filled 2020-09-06: qty 1

## 2020-09-06 MED ORDER — SODIUM CHLORIDE 0.9 % IV BOLUS
1000.0000 mL | Freq: Once | INTRAVENOUS | Status: AC
  Administered 2020-09-06: 1000 mL via INTRAVENOUS

## 2020-09-06 MED ORDER — DEXAMETHASONE SODIUM PHOSPHATE 10 MG/ML IJ SOLN
10.0000 mg | Freq: Once | INTRAMUSCULAR | Status: AC
  Administered 2020-09-06: 10 mg via INTRAVENOUS
  Filled 2020-09-06: qty 1

## 2020-09-06 MED ORDER — METOCLOPRAMIDE HCL 5 MG/ML IJ SOLN
10.0000 mg | Freq: Once | INTRAMUSCULAR | Status: AC
  Administered 2020-09-06: 10 mg via INTRAVENOUS
  Filled 2020-09-06: qty 2
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
## 2020-09-06 NOTE — ED Notes (Signed)
Patient transported to CT 

## 2020-12-05 ENCOUNTER — Encounter (HOSPITAL_COMMUNITY): Payer: Self-pay

## 2021-01-01 DIAGNOSIS — M545 Low back pain, unspecified: Secondary | ICD-10-CM | POA: Diagnosis not present

## 2021-01-01 DIAGNOSIS — G8929 Other chronic pain: Secondary | ICD-10-CM | POA: Diagnosis not present

## 2021-01-29 DIAGNOSIS — M545 Low back pain, unspecified: Secondary | ICD-10-CM | POA: Diagnosis not present

## 2021-01-29 DIAGNOSIS — M542 Cervicalgia: Secondary | ICD-10-CM | POA: Diagnosis not present

## 2021-03-24 DIAGNOSIS — Z1231 Encounter for screening mammogram for malignant neoplasm of breast: Secondary | ICD-10-CM | POA: Diagnosis not present

## 2021-04-20 DIAGNOSIS — R030 Elevated blood-pressure reading, without diagnosis of hypertension: Secondary | ICD-10-CM | POA: Diagnosis not present

## 2021-04-20 DIAGNOSIS — E1169 Type 2 diabetes mellitus with other specified complication: Secondary | ICD-10-CM | POA: Diagnosis not present

## 2021-04-20 DIAGNOSIS — N3281 Overactive bladder: Secondary | ICD-10-CM | POA: Diagnosis not present

## 2021-04-20 DIAGNOSIS — R5382 Chronic fatigue, unspecified: Secondary | ICD-10-CM | POA: Diagnosis not present

## 2021-04-20 DIAGNOSIS — R3589 Other polyuria: Secondary | ICD-10-CM | POA: Diagnosis not present

## 2021-04-20 DIAGNOSIS — M159 Polyosteoarthritis, unspecified: Secondary | ICD-10-CM | POA: Diagnosis not present

## 2021-05-05 DIAGNOSIS — N3281 Overactive bladder: Secondary | ICD-10-CM | POA: Diagnosis not present

## 2021-05-05 DIAGNOSIS — E1169 Type 2 diabetes mellitus with other specified complication: Secondary | ICD-10-CM | POA: Diagnosis not present

## 2021-05-05 DIAGNOSIS — K30 Functional dyspepsia: Secondary | ICD-10-CM | POA: Diagnosis not present

## 2021-05-05 DIAGNOSIS — R197 Diarrhea, unspecified: Secondary | ICD-10-CM | POA: Diagnosis not present

## 2021-05-05 DIAGNOSIS — M159 Polyosteoarthritis, unspecified: Secondary | ICD-10-CM | POA: Diagnosis not present

## 2021-05-12 DIAGNOSIS — H1789 Other corneal scars and opacities: Secondary | ICD-10-CM | POA: Diagnosis not present

## 2021-05-12 DIAGNOSIS — H5213 Myopia, bilateral: Secondary | ICD-10-CM | POA: Diagnosis not present

## 2021-05-20 DIAGNOSIS — N3281 Overactive bladder: Secondary | ICD-10-CM | POA: Diagnosis not present

## 2021-07-08 DIAGNOSIS — N3281 Overactive bladder: Secondary | ICD-10-CM | POA: Diagnosis not present

## 2021-09-11 DIAGNOSIS — R5383 Other fatigue: Secondary | ICD-10-CM | POA: Diagnosis not present

## 2021-10-06 DIAGNOSIS — R5383 Other fatigue: Secondary | ICD-10-CM | POA: Diagnosis not present

## 2021-10-06 DIAGNOSIS — R519 Headache, unspecified: Secondary | ICD-10-CM | POA: Diagnosis not present

## 2021-10-06 DIAGNOSIS — D352 Benign neoplasm of pituitary gland: Secondary | ICD-10-CM | POA: Diagnosis not present

## 2021-10-07 ENCOUNTER — Emergency Department (HOSPITAL_COMMUNITY)
Admission: EM | Admit: 2021-10-07 | Discharge: 2021-10-07 | Disposition: A | Discharge status: Home / Self Care | Payer: Medicare Other | Attending: Emergency Medicine | Admitting: Emergency Medicine

## 2021-10-07 ENCOUNTER — Emergency Department (HOSPITAL_COMMUNITY): Payer: Medicare Other

## 2021-10-07 ENCOUNTER — Encounter (HOSPITAL_COMMUNITY): Payer: Self-pay | Admitting: Emergency Medicine

## 2021-10-07 DIAGNOSIS — I10 Essential (primary) hypertension: Secondary | ICD-10-CM | POA: Diagnosis not present

## 2021-10-07 DIAGNOSIS — Z85828 Personal history of other malignant neoplasm of skin: Secondary | ICD-10-CM | POA: Insufficient documentation

## 2021-10-07 DIAGNOSIS — Z20822 Contact with and (suspected) exposure to covid-19: Secondary | ICD-10-CM | POA: Insufficient documentation

## 2021-10-07 DIAGNOSIS — R519 Headache, unspecified: Secondary | ICD-10-CM | POA: Insufficient documentation

## 2021-10-07 DIAGNOSIS — F17200 Nicotine dependence, unspecified, uncomplicated: Secondary | ICD-10-CM | POA: Diagnosis not present

## 2021-10-07 DIAGNOSIS — R5383 Other fatigue: Secondary | ICD-10-CM | POA: Diagnosis not present

## 2021-10-07 DIAGNOSIS — Z8669 Personal history of other diseases of the nervous system and sense organs: Secondary | ICD-10-CM | POA: Insufficient documentation

## 2021-10-07 DIAGNOSIS — Z79899 Other long term (current) drug therapy: Secondary | ICD-10-CM | POA: Diagnosis not present

## 2021-10-07 DIAGNOSIS — Q283 Other malformations of cerebral vessels: Secondary | ICD-10-CM | POA: Diagnosis not present

## 2021-10-07 LAB — CBC
HCT: 41.8 % (ref 36.0–46.0)
Hemoglobin: 12.8 g/dL (ref 12.0–15.0)
MCH: 28.5 pg (ref 26.0–34.0)
MCHC: 30.6 g/dL (ref 30.0–36.0)
MCV: 93.1 fL (ref 80.0–100.0)
Platelets: 252 10*3/uL (ref 150–400)
RBC: 4.49 MIL/uL (ref 3.87–5.11)
RDW: 12.6 % (ref 11.5–15.5)
WBC: 3.3 10*3/uL — ABNORMAL LOW (ref 4.0–10.5)
nRBC: 0 % (ref 0.0–0.2)

## 2021-10-07 LAB — BASIC METABOLIC PANEL
Anion gap: 6 (ref 5–15)
BUN: 10 mg/dL (ref 6–20)
CO2: 27 mmol/L (ref 22–32)
Calcium: 8.9 mg/dL (ref 8.9–10.3)
Chloride: 108 mmol/L (ref 98–111)
Creatinine, Ser: 0.86 mg/dL (ref 0.44–1.00)
GFR, Estimated: >60 mL/min (ref >60)
Glucose, Bld: 107 mg/dL — ABNORMAL HIGH (ref 70–99)
Potassium: 4 mmol/L (ref 3.5–5.1)
Sodium: 141 mmol/L (ref 135–145)

## 2021-10-07 LAB — RESP PANEL BY RT-PCR (FLU A&B, COVID) ARPGX2
Influenza A by PCR: NEGATIVE
Influenza B by PCR: NEGATIVE
SARS Coronavirus 2 by RT PCR: NEGATIVE

## 2021-10-07 MED ORDER — KETOROLAC TROMETHAMINE 15 MG/ML IJ SOLN
15.0000 mg | Freq: Once | INTRAMUSCULAR | Status: AC
  Administered 2021-10-07: 15 mg via INTRAVENOUS
  Filled 2021-10-07: qty 1

## 2021-10-07 MED ORDER — IOHEXOL 350 MG/ML SOLN
75.0000 mL | Freq: Once | INTRAVENOUS | Status: AC | PRN
  Administered 2021-10-07: 75 mL via INTRAVENOUS

## 2021-10-07 MED ORDER — NAPROXEN 500 MG PO TABS: 500.0000 mg | ORAL_TABLET | Freq: Two times a day (BID) | ORAL | 0 refills | Status: DC | PRN

## 2021-10-07 MED ORDER — DIPHENHYDRAMINE HCL 50 MG/ML IJ SOLN
12.5000 mg | Freq: Once | INTRAMUSCULAR | Status: AC
  Administered 2021-10-07: 12.5 mg via INTRAVENOUS
  Filled 2021-10-07: qty 1

## 2021-10-07 MED ORDER — SODIUM CHLORIDE 0.9 % IV BOLUS
1000.0000 mL | Freq: Once | INTRAVENOUS | Status: AC
  Administered 2021-10-07: 1000 mL via INTRAVENOUS

## 2021-10-07 MED ORDER — SODIUM CHLORIDE 0.9 % IV SOLN: INTRAVENOUS | Status: DC

## 2021-10-07 MED ORDER — MORPHINE SULFATE (PF) 4 MG/ML IV SOLN
4.0000 mg | Freq: Once | INTRAVENOUS | Status: AC
  Administered 2021-10-07: 4 mg via INTRAVENOUS
  Filled 2021-10-07: qty 1

## 2021-10-07 MED ORDER — METOCLOPRAMIDE HCL 5 MG/ML IJ SOLN
10.0000 mg | Freq: Once | INTRAMUSCULAR | Status: AC
  Administered 2021-10-07: 10 mg via INTRAVENOUS
  Filled 2021-10-07: qty 2

## 2021-10-07 NOTE — ED Notes (Signed)
Patient A/A/Ox4, ambulated to BR without difficulty
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
## 2021-10-07 NOTE — ED Triage Notes (Signed)
Pt sent from PCP for headache x8 days and possible MRI. Pt had root canal on 9/30 and started on Levaquin after due to infection after. Pt has completed abx treatment but headaches began after. Pt endorses fatigue and head pressure. Pt has been alternating between ibuprofen and tylenol.

## 2021-10-07 NOTE — ED Notes (Signed)
Continues to ambulate to BR and back to bed without difficulty

## 2021-10-07 NOTE — Discharge Instructions (Signed)
Follow-up with your primary care doctor.  Come back to ER if you develop worsening, or uncontrolled headache, vomiting, fever or other new concerning symptom.  Recommend Tylenol and anti-inflammatory such as prescribed naproxen or Motrin for pain control.

## 2021-10-19 DIAGNOSIS — D352 Benign neoplasm of pituitary gland: Secondary | ICD-10-CM | POA: Diagnosis not present

## 2021-10-19 DIAGNOSIS — R413 Other amnesia: Secondary | ICD-10-CM | POA: Diagnosis not present

## 2021-10-19 DIAGNOSIS — R946 Abnormal results of thyroid function studies: Secondary | ICD-10-CM | POA: Diagnosis not present

## 2021-10-19 DIAGNOSIS — Z23 Encounter for immunization: Secondary | ICD-10-CM | POA: Diagnosis not present

## 2021-10-19 DIAGNOSIS — R5383 Other fatigue: Secondary | ICD-10-CM | POA: Diagnosis not present

## 2021-12-07 DIAGNOSIS — K580 Irritable bowel syndrome with diarrhea: Secondary | ICD-10-CM | POA: Diagnosis not present

## 2021-12-07 DIAGNOSIS — Z79899 Other long term (current) drug therapy: Secondary | ICD-10-CM | POA: Diagnosis not present

## 2021-12-07 DIAGNOSIS — M5412 Radiculopathy, cervical region: Secondary | ICD-10-CM | POA: Diagnosis not present

## 2021-12-07 DIAGNOSIS — N3281 Overactive bladder: Secondary | ICD-10-CM | POA: Diagnosis not present

## 2021-12-16 ENCOUNTER — Encounter (HOSPITAL_COMMUNITY): Payer: Self-pay | Admitting: *Deleted

## 2021-12-22 DIAGNOSIS — E875 Hyperkalemia: Secondary | ICD-10-CM | POA: Diagnosis not present

## 2022-01-11 DIAGNOSIS — G43909 Migraine, unspecified, not intractable, without status migrainosus: Secondary | ICD-10-CM | POA: Diagnosis not present

## 2022-01-19 DIAGNOSIS — E0590 Thyrotoxicosis, unspecified without thyrotoxic crisis or storm: Secondary | ICD-10-CM | POA: Diagnosis not present

## 2022-01-19 DIAGNOSIS — D352 Benign neoplasm of pituitary gland: Secondary | ICD-10-CM | POA: Diagnosis not present

## 2022-01-19 DIAGNOSIS — R6889 Other general symptoms and signs: Secondary | ICD-10-CM | POA: Diagnosis not present

## 2022-01-19 DIAGNOSIS — Z1329 Encounter for screening for other suspected endocrine disorder: Secondary | ICD-10-CM | POA: Diagnosis not present

## 2022-02-11 DIAGNOSIS — R350 Frequency of micturition: Secondary | ICD-10-CM | POA: Diagnosis not present

## 2022-02-11 DIAGNOSIS — N3281 Overactive bladder: Secondary | ICD-10-CM | POA: Diagnosis not present

## 2022-03-08 DIAGNOSIS — K580 Irritable bowel syndrome with diarrhea: Secondary | ICD-10-CM | POA: Diagnosis not present

## 2022-03-08 DIAGNOSIS — N3281 Overactive bladder: Secondary | ICD-10-CM | POA: Diagnosis not present

## 2022-03-08 DIAGNOSIS — R5383 Other fatigue: Secondary | ICD-10-CM | POA: Diagnosis not present

## 2022-03-29 DIAGNOSIS — N200 Calculus of kidney: Secondary | ICD-10-CM | POA: Diagnosis not present

## 2022-03-29 DIAGNOSIS — N3281 Overactive bladder: Secondary | ICD-10-CM | POA: Diagnosis not present

## 2022-03-30 DIAGNOSIS — N200 Calculus of kidney: Secondary | ICD-10-CM | POA: Diagnosis not present

## 2022-03-30 DIAGNOSIS — I878 Other specified disorders of veins: Secondary | ICD-10-CM | POA: Diagnosis not present

## 2022-03-30 DIAGNOSIS — Z87442 Personal history of urinary calculi: Secondary | ICD-10-CM | POA: Diagnosis not present

## 2022-03-30 DIAGNOSIS — N3281 Overactive bladder: Secondary | ICD-10-CM | POA: Diagnosis not present

## 2022-03-30 DIAGNOSIS — R350 Frequency of micturition: Secondary | ICD-10-CM | POA: Diagnosis not present

## 2022-04-07 ENCOUNTER — Other Ambulatory Visit: Payer: Self-pay | Admitting: Hematology and Oncology

## 2022-04-07 DIAGNOSIS — D509 Iron deficiency anemia, unspecified: Secondary | ICD-10-CM | POA: Diagnosis not present

## 2022-04-08 ENCOUNTER — Inpatient Hospital Stay: Payer: Medicare Other | Attending: Hematology and Oncology | Admitting: Hematology and Oncology

## 2022-04-08 ENCOUNTER — Encounter: Payer: Self-pay | Admitting: Hematology and Oncology

## 2022-04-08 ENCOUNTER — Inpatient Hospital Stay: Payer: Medicare Other

## 2022-04-08 ENCOUNTER — Other Ambulatory Visit: Payer: Self-pay | Admitting: Hematology and Oncology

## 2022-04-08 VITALS — BP 130/60 | HR 78 | Temp 98.8°F | Resp 16 | Ht 63.5 in | Wt 156.7 lb

## 2022-04-08 DIAGNOSIS — K219 Gastro-esophageal reflux disease without esophagitis: Secondary | ICD-10-CM

## 2022-04-08 DIAGNOSIS — F32A Depression, unspecified: Secondary | ICD-10-CM

## 2022-04-08 DIAGNOSIS — Z8041 Family history of malignant neoplasm of ovary: Secondary | ICD-10-CM

## 2022-04-08 DIAGNOSIS — Z8051 Family history of malignant neoplasm of kidney: Secondary | ICD-10-CM | POA: Diagnosis not present

## 2022-04-08 DIAGNOSIS — M1990 Unspecified osteoarthritis, unspecified site: Secondary | ICD-10-CM

## 2022-04-08 DIAGNOSIS — Z86711 Personal history of pulmonary embolism: Secondary | ICD-10-CM

## 2022-04-08 DIAGNOSIS — Z809 Family history of malignant neoplasm, unspecified: Secondary | ICD-10-CM

## 2022-04-08 DIAGNOSIS — F419 Anxiety disorder, unspecified: Secondary | ICD-10-CM

## 2022-04-08 DIAGNOSIS — Z85828 Personal history of other malignant neoplasm of skin: Secondary | ICD-10-CM

## 2022-04-08 DIAGNOSIS — Z87891 Personal history of nicotine dependence: Secondary | ICD-10-CM

## 2022-04-08 DIAGNOSIS — D509 Iron deficiency anemia, unspecified: Secondary | ICD-10-CM

## 2022-04-08 DIAGNOSIS — D649 Anemia, unspecified: Secondary | ICD-10-CM | POA: Diagnosis not present

## 2022-04-08 DIAGNOSIS — R634 Abnormal weight loss: Secondary | ICD-10-CM

## 2022-04-08 LAB — CBC: RBC: 4.28 (ref 3.87–5.11)

## 2022-04-08 LAB — IRON AND TIBC
Iron: 72 ug/dL (ref 28–170)
Saturation Ratios: 15 % (ref 10.4–31.8)
TIBC: 488 ug/dL — ABNORMAL HIGH (ref 250–450)
UIBC: 416 ug/dL

## 2022-04-08 LAB — CBC AND DIFFERENTIAL
HCT: 37 (ref 36–46)
Hemoglobin: 11.9 — AB (ref 12.0–16.0)
Neutrophils Absolute: 5.33
Platelets: 227 10*3/uL (ref 150–400)
WBC: 7.1

## 2022-04-08 LAB — FOLATE: Folate: 13.9 ng/mL (ref >5.9)

## 2022-04-08 LAB — VITAMIN B12: Vitamin B-12: 171 pg/mL — ABNORMAL LOW (ref 180–914)

## 2022-04-08 LAB — FERRITIN: Ferritin: 7 ng/mL — ABNORMAL LOW (ref 11–307)

## 2022-04-08 MED ORDER — CYANOCOBALAMIN 1000 MCG/ML IJ SOLN: 1000.0000 ug | INTRAMUSCULAR | 0 refills | Status: DC
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
## 2022-04-09 ENCOUNTER — Encounter: Payer: Self-pay | Admitting: Hematology and Oncology

## 2022-04-09 NOTE — Addendum Note (Signed)
Addended by: Juanetta Beets on: 04/09/2022 12:31 PM ? ? Modules accepted: Orders ? ?

## 2022-04-12 ENCOUNTER — Encounter: Payer: Self-pay | Admitting: Physician Assistant

## 2022-04-12 ENCOUNTER — Ambulatory Visit (INDEPENDENT_AMBULATORY_CARE_PROVIDER_SITE_OTHER): Payer: Medicare Other | Admitting: Physician Assistant

## 2022-04-12 VITALS — BP 131/78 | HR 91 | Ht 63.0 in | Wt 154.8 lb

## 2022-04-12 DIAGNOSIS — F419 Anxiety disorder, unspecified: Secondary | ICD-10-CM

## 2022-04-12 DIAGNOSIS — F4310 Post-traumatic stress disorder, unspecified: Secondary | ICD-10-CM | POA: Diagnosis not present

## 2022-04-12 DIAGNOSIS — F1021 Alcohol dependence, in remission: Secondary | ICD-10-CM

## 2022-04-12 DIAGNOSIS — F250 Schizoaffective disorder, bipolar type: Secondary | ICD-10-CM | POA: Diagnosis not present

## 2022-04-12 DIAGNOSIS — T1491XA Suicide attempt, initial encounter: Secondary | ICD-10-CM

## 2022-04-12 DIAGNOSIS — F3341 Major depressive disorder, recurrent, in partial remission: Secondary | ICD-10-CM

## 2022-04-12 DIAGNOSIS — F5105 Insomnia due to other mental disorder: Secondary | ICD-10-CM

## 2022-04-12 DIAGNOSIS — F99 Mental disorder, not otherwise specified: Secondary | ICD-10-CM

## 2022-04-12 MED ORDER — RISPERIDONE 3 MG PO TABS: 3.0000 mg | ORAL_TABLET | Freq: Every day | ORAL | 0 refills | Status: DC

## 2022-04-12 MED ORDER — BUSPIRONE HCL 15 MG PO TABS: 15.0000 mg | ORAL_TABLET | Freq: Three times a day (TID) | ORAL | 0 refills | Status: DC

## 2022-04-12 MED ORDER — TRAZODONE HCL 50 MG PO TABS: 150.0000 mg | ORAL_TABLET | Freq: Every evening | ORAL | 0 refills | Status: DC | PRN

## 2022-04-12 MED ORDER — RISPERIDONE 1 MG PO TABS: 1.0000 mg | ORAL_TABLET | Freq: Every day | ORAL | 0 refills | Status: DC
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
## 2022-04-14 ENCOUNTER — Inpatient Hospital Stay: Payer: Medicare Other

## 2022-04-14 VITALS — BP 124/70 | HR 79 | Temp 98.1°F | Resp 16 | Wt 151.0 lb

## 2022-04-14 DIAGNOSIS — D62 Acute posthemorrhagic anemia: Secondary | ICD-10-CM

## 2022-04-14 DIAGNOSIS — D509 Iron deficiency anemia, unspecified: Secondary | ICD-10-CM | POA: Diagnosis not present

## 2022-04-14 MED ORDER — SODIUM CHLORIDE 0.9 % IV SOLN
510.0000 mg | Freq: Once | INTRAVENOUS | Status: AC
  Administered 2022-04-14: 510 mg via INTRAVENOUS
  Filled 2022-04-14: qty 510

## 2022-04-14 MED ORDER — SODIUM CHLORIDE 0.9 % IV SOLN: Freq: Once | INTRAVENOUS | Status: AC

## 2022-04-14 NOTE — Patient Instructions (Addendum)
Ferumoxytol Injection ?What is this medication? ?FERUMOXYTOL (FER ue MOX i tol) treats low levels of iron in your body (iron deficiency anemia). Iron is a mineral that plays an important role in making red blood cells, which carry oxygen from your lungs to the rest of your body. ?This medicine may be used for other purposes; ask your health care provider or pharmacist if you have questions. ?COMMON BRAND NAME(S): Feraheme ?What should I tell my care team before I take this medication? ?They need to know if you have any of these conditions: ?Anemia not caused by low iron levels ?High levels of iron in the blood ?Magnetic resonance imaging (MRI) test scheduled ?An unusual or allergic reaction to iron, other medications, foods, dyes, or preservatives ?Pregnant or trying to get pregnant ?Breast-feeding ?How should I use this medication? ?This medication is for injection into a vein. It is given in a hospital or clinic setting. ?Talk to your care team the use of this medication in children. Special care may be needed. ?Overdosage: If you think you have taken too much of this medicine contact a poison control center or emergency room at once. ?NOTE: This medicine is only for you. Do not share this medicine with others. ?What if I miss a dose? ?It is important not to miss your dose. Call your care team if you are unable to keep an appointment. ?What may interact with this medication? ?Other iron products ?This list may not describe all possible interactions. Give your health care provider a list of all the medicines, herbs, non-prescription drugs, or dietary supplements you use. Also tell them if you smoke, drink alcohol, or use illegal drugs. Some items may interact with your medicine. ?What should I watch for while using this medication? ?Visit your care team regularly. Tell your care team if your symptoms do not start to get better or if they get worse. You may need blood work done while you are taking this  medication. ?You may need to follow a special diet. Talk to your care team. Foods that contain iron include: whole grains/cereals, dried fruits, beans, or peas, leafy green vegetables, and organ meats (liver, kidney). ?What side effects may I notice from receiving this medication? ?Side effects that you should report to your care team as soon as possible: ?Allergic reactions--skin rash, itching, hives, swelling of the face, lips, tongue, or throat ?Low blood pressure--dizziness, feeling faint or lightheaded, blurry vision ?Shortness of breath ?Side effects that usually do not require medical attention (report to your care team if they continue or are bothersome): ?Flushing ?Headache ?Joint pain ?Muscle pain ?Nausea ?Pain, redness, or irritation at injection site ?This list may not describe all possible side effects. Call your doctor for medical advice about side effects. You may report side effects to FDA at 1-800-FDA-1088. ?Where should I keep my medication? ?This medication is given in a hospital or clinic and will not be stored at home. ?NOTE: This sheet is a summary. It may not cover all possible information. If you have questions about this medicine, talk to your doctor, pharmacist, or health care provider. ?? 2023 Elsevier/Gold Standard (2021-05-08 00:00:00) ?Self-Harming Behavior Information ?Self-harming behavior, also called non-suicidal self-injury (NSSI), is intentionally doing something to hurt yourself. This may include: ?Cutting yourself. ?Burning yourself. ?Biting or scratching yourself. ?Banging your head. ?Hitting yourself until you bruise. ?People may do this to cope with difficult emotions, and they may feel a sense of relief or pleasure after the self-injury. ?How can self-harming behavior affect  me? ?Self-harming behaviors can damage your body and cause serious injuries. You may mistakenly cut yourself deeply or burn yourself badly. Serious cuts or burns may need immediate medical attention.  These actions can also leave scars. ?Self-harming behavior may affect your performance at school, work, or Water quality scientist activities. It is also very likely to affect your relationships with others. ?Self-harming behavior can be a symptom of a mental health condition and this may put you at risk for thoughts of suicide in the future. Treatment may be required. ?How can self-harming behavior affect my loved ones? ?It is difficult and frightening to know that someone you love has hurt himself or herself. Your parents, friends, and teachers may: ?Not understand why you are hurting yourself. ?Fear that you want to commit suicide. ?Not know how to talk to you about your behaviors. ?Not know how to help you. ?What actions can I take to manage stress and emotions? ?Self-harm is often a sign that you need help coping with difficult emotions. To learn healthy ways of managing your emotions and feeling better, take these steps: ?Talk to people you trust ? ?Talk to someone you trust. Tell your parents, a friend, a Pharmacist, hospital, or a coach what you are doing. ?Ask for professional help. A mental health professional can help you stop hurting yourself. ?Meet regularly with your therapist. ?Find a support group. ?Manage your stress ?Find healthy ways to manage stress, such as: ?Deep breathing. ?Yoga or meditation. ?Spending time in nature. ?Listening to music. ? ?Express your emotions in a healthy way ?Find positive ways to express your emotions, such as: ?Talking to a friend. ?Journaling. ?Creating art or playing music. ?Seek help when you are in a crisis ?If you feel like you need to hurt yourself, call a crisis hotline: ?National Helpline: 1-800-SUICIDE 330-292-3038) ?National Suicide Prevention Lifeline: 1-800-273-TALK 606-096-1553) or 988 ?LGBT Youth Suicide Hotline: 1-866-4-U-TREVOR ?SAFE (Self-Abuse Finally Ends) Alternatives: 1-800-DONT-CUT (846-6599) or www.selfinjury.com ?Follow your health care provider's instructions ?Your health  care provider or your counselor may give you more instructions. In general: ?Follow your treatment plan. ?Get regular exercise, and get enough sleep every night. ?Do not use drugs or alcohol. ?Keep all follow-up visits. This is important. ?Where to find more information ?Learn more about self-injury from: ?[employer] on Mental Illness: www.nami.org ?Centers for Disease Control and Prevention: http://www.wolf.info/ ?Get help right away if: ?You have thoughts of hurting yourself or others. ?If you ever feel like you may hurt yourself or others, or have thoughts about taking your own life, get help right away. Go to your nearest emergency department or: ?Call your local emergency services (911 in the [place] ?Call a suicide crisis helpline, such as the [facility] at 912-580-2087 or 988 in the [place]. This is open 24 hours a day in the [place] ?Text the Crisis Text Line at 712-527-4946 (in the [place].). ?Summary ?Self-harming behavior, also called non-suicidal self-injury (NSSI), is intentionally doing something to hurt yourself. ?Self-harming behavior can be a symptom of a mental health condition and may put you at risk for thoughts of suicide in the future. ?Self-harming behaviors can damage your body and cause serious injuries. ?If you feel like you need to harm yourself, tell someone you trust and call a crisis center right away. ?This information is not intended to replace advice given to you by your health care provider. Make sure you discuss any questions you have with your health care provider. ?Document Revised: 07/09/2021 Document Reviewed: 04/23/2021 ?Elsevier Patient Education ?  [place]. ? ?

## 2022-04-19 ENCOUNTER — Inpatient Hospital Stay: Payer: Medicare Other

## 2022-04-19 ENCOUNTER — Encounter: Payer: Self-pay | Admitting: Hematology and Oncology

## 2022-04-19 VITALS — BP 124/74 | HR 88 | Temp 98.2°F | Resp 18 | Wt 151.0 lb

## 2022-04-19 DIAGNOSIS — D62 Acute posthemorrhagic anemia: Secondary | ICD-10-CM

## 2022-04-19 DIAGNOSIS — D509 Iron deficiency anemia, unspecified: Secondary | ICD-10-CM | POA: Diagnosis not present

## 2022-04-19 MED ORDER — SODIUM CHLORIDE 0.9 % IV SOLN
510.0000 mg | Freq: Once | INTRAVENOUS | Status: AC
  Administered 2022-04-19: 510 mg via INTRAVENOUS
  Filled 2022-04-19: qty 510

## 2022-04-19 MED ORDER — SODIUM CHLORIDE 0.9 % IV SOLN: Freq: Once | INTRAVENOUS | Status: AC

## 2022-04-19 NOTE — Patient Instructions (Signed)

## 2022-04-28 DIAGNOSIS — N200 Calculus of kidney: Secondary | ICD-10-CM | POA: Diagnosis not present

## 2022-04-28 DIAGNOSIS — N3281 Overactive bladder: Secondary | ICD-10-CM | POA: Diagnosis not present

## 2022-04-30 DIAGNOSIS — G629 Polyneuropathy, unspecified: Secondary | ICD-10-CM | POA: Diagnosis not present

## 2022-05-05 DIAGNOSIS — M542 Cervicalgia: Secondary | ICD-10-CM | POA: Diagnosis not present

## 2022-05-05 DIAGNOSIS — R200 Anesthesia of skin: Secondary | ICD-10-CM | POA: Diagnosis not present

## 2022-05-05 DIAGNOSIS — R202 Paresthesia of skin: Secondary | ICD-10-CM | POA: Diagnosis not present

## 2022-05-06 DIAGNOSIS — M545 Low back pain, unspecified: Secondary | ICD-10-CM | POA: Diagnosis not present

## 2022-05-06 DIAGNOSIS — M47815 Spondylosis without myelopathy or radiculopathy, thoracolumbar region: Secondary | ICD-10-CM | POA: Diagnosis not present

## 2022-05-06 DIAGNOSIS — R200 Anesthesia of skin: Secondary | ICD-10-CM | POA: Diagnosis not present

## 2022-05-06 DIAGNOSIS — M4802 Spinal stenosis, cervical region: Secondary | ICD-10-CM | POA: Diagnosis not present

## 2022-05-06 DIAGNOSIS — R202 Paresthesia of skin: Secondary | ICD-10-CM | POA: Diagnosis not present

## 2022-05-06 DIAGNOSIS — M542 Cervicalgia: Secondary | ICD-10-CM | POA: Diagnosis not present

## 2022-05-06 DIAGNOSIS — M47812 Spondylosis without myelopathy or radiculopathy, cervical region: Secondary | ICD-10-CM | POA: Diagnosis not present

## 2022-05-14 DIAGNOSIS — R410 Disorientation, unspecified: Secondary | ICD-10-CM | POA: Diagnosis not present

## 2022-05-18 ENCOUNTER — Other Ambulatory Visit: Payer: Self-pay

## 2022-05-19 ENCOUNTER — Other Ambulatory Visit: Payer: Self-pay | Admitting: Hematology and Oncology

## 2022-05-19 DIAGNOSIS — D509 Iron deficiency anemia, unspecified: Secondary | ICD-10-CM

## 2022-05-20 ENCOUNTER — Encounter: Payer: Self-pay | Admitting: Hematology and Oncology

## 2022-05-20 ENCOUNTER — Inpatient Hospital Stay: Payer: Medicare Other | Attending: Hematology and Oncology | Admitting: Hematology and Oncology

## 2022-05-20 ENCOUNTER — Inpatient Hospital Stay: Payer: Medicare Other

## 2022-05-20 VITALS — BP 126/62 | HR 79 | Temp 98.4°F | Resp 18 | Ht 63.0 in | Wt 151.6 lb

## 2022-05-20 DIAGNOSIS — D509 Iron deficiency anemia, unspecified: Secondary | ICD-10-CM

## 2022-05-20 DIAGNOSIS — Z79899 Other long term (current) drug therapy: Secondary | ICD-10-CM | POA: Insufficient documentation

## 2022-05-20 DIAGNOSIS — D649 Anemia, unspecified: Secondary | ICD-10-CM | POA: Diagnosis not present

## 2022-05-20 LAB — CBC AND DIFFERENTIAL
HCT: 42 (ref 36–46)
Hemoglobin: 13.4 (ref 12.0–16.0)
Neutrophils Absolute: 2.58
Neutrophils Absolute: 4.53
Platelets: 209 10*3/uL (ref 150–400)
WBC: 4.1

## 2022-05-20 LAB — IRON AND TIBC
Iron: 141 ug/dL (ref 28–170)
Saturation Ratios: 51 % — ABNORMAL HIGH (ref 10.4–31.8)
TIBC: 279 ug/dL (ref 250–450)
UIBC: 138 ug/dL

## 2022-05-20 LAB — CBC: RBC: 4.53 (ref 3.87–5.11)

## 2022-05-20 LAB — FERRITIN: Ferritin: 159 ng/mL (ref 11–307)

## 2022-05-20 NOTE — Assessment & Plan Note (Addendum)
51 y.o. female with long standing history of fatigue. She was recently worked up by her PCP and found to have a decreased ferritin.  Hemoglobin was 11.9, ferritin was 7, saturation was 15 and vitamin B12 was slightly decreased at 171. She received Feraheme x 2 doses and Vitamin B12 weekly x 4. Today, hemoglobin has improved to 13.4 today with iron studies pending. She continues to have intolerance to cold with fatigue and joint aches. She has responded well in regards to her iron deficiency.  We will continue with monthly B12 injections and I will send blood for thyroid panel today for further evaluation. We will see her in 3 months for repeat evaluation.

## 2022-05-20 NOTE — Progress Notes (Cosign Needed)
Patient Care Team: Marga Hoots, NP as PCP - General (Adult Health Nurse Practitioner) Melodye Ped, NP as PCP - Hematology/Oncology (Hematology and Oncology) Heath Lark, MD as Consulting Physician (Hematology and Oncology)  Clinic Day:  05/20/2022  Referring physician: Carvel Getting Key, *  ASSESSMENT & PLAN:   Assessment & Plan: Iron deficiency anemia 51 y.o. female with long standing history of fatigue. She was recently worked up by her PCP and found to have a decreased ferritin.  Hemoglobin was 11.9, ferritin was 7, saturation was 15 and vitamin B12 was slightly decreased at 171. She received Feraheme x 2 doses and Vitamin B12 weekly x 4. Today, hemoglobin has improved to 13.4 today with iron studies pending. She continues to have intolerance to cold with fatigue and joint aches. We will continue with monthly B12 injections and I will send blood for thyroid panel today for further evaluation.    The patient understands the plans discussed today and is in agreement with them.  She knows to contact our office if she develops concerns prior to her next appointment.    Melodye Ped, NP  [facility] [address] [place] [address] Dept: [phone] Dept Fax: [phone]   Orders Placed This Encounter  Procedures   Thyroid Panel With TSH    Standing Status:   Future    Standing Expiration Date:   05/20/2023   CBC and differential    This external order was created through the Results Console.   CBC and differential    This external order was created through the Results Console.   CBC    This external order was created through the Results Console.      CHIEF COMPLAINT:  CC: A 51 year old female with history of iron deficiency anemia here for 4 week evaluation  Current Treatment:  Feraheme/ vitamin B12  INTERVAL HISTORY:  Shuna is here today for repeat clinical assessment. She  denies fevers or chills. She denies pain. Her appetite is good. Her weight has been stable.  I have reviewed the past medical history, past surgical history, social history and family history with the patient and they are unchanged from previous note.  ALLERGIES:  is allergic to augmentin [amoxicillin-pot clavulanate], enoxaparin, avelox [moxifloxacin hcl in nacl], penicillins, sodium hydroxide, and avelox [moxifloxacin].  MEDICATIONS:  Current Outpatient Medications  Medication Sig Dispense Refill   mirabegron ER (MYRBETRIQ) 50 MG TB24 tablet Take 50 mg by mouth at bedtime.     acetaminophen (TYLENOL) 500 MG tablet Take by mouth.     busPIRone (BUSPAR) 15 MG tablet Take 1 tablet (15 mg total) by mouth 3 (three) times daily. 270 tablet 0   cyanocobalamin (,VITAMIN B-12,) 1000 MCG/ML injection Inject 1 mL (1,000 mcg total) into the muscle once a week. 4 mL 0   cyclobenzaprine (FLEXERIL) 5 MG tablet SMARTSIG:1 Tablet(s) By Mouth 1-3 Times Daily PRN     famotidine (PEPCID) 20 MG tablet Take 20 mg by mouth 2 (two) times daily.     fesoterodine (TOVIAZ) 8 MG TB24 tablet Take 8 mg by mouth daily.     Multiple Vitamin (MULTI-VITAMIN DAILY PO) Multi Vitamin     Probiotic Product (PROBIOTIC DAILY PO) Take by mouth.     risperiDONE (RISPERDAL) 1 MG tablet Take 1 tablet (1 mg total) by mouth daily. Q AM 90 tablet 0   risperiDONE (RISPERDAL) 3 MG tablet Take 1 tablet (3 mg total) by  mouth at bedtime. 90 tablet 0   traZODone (DESYREL) 150 MG tablet trazodone 150 mg tablet  Take 1 tablet every day by oral route.     No current facility-administered medications for this visit.    HISTORY OF PRESENT ILLNESS:   Oncology History   No history exists.      REVIEW OF SYSTEMS:   Constitutional: Denies fevers, chills or abnormal weight loss Eyes: Denies blurriness of vision Ears, nose, mouth, throat, and face: Denies mucositis or sore throat Respiratory: Denies cough, dyspnea or  wheezes Cardiovascular: Denies palpitation, chest discomfort or lower extremity swelling Gastrointestinal:  Denies nausea, heartburn or change in bowel habits Skin: Denies abnormal skin rashes Lymphatics: Denies new lymphadenopathy or easy bruising Neurological:Denies numbness, tingling or new weaknesses Behavioral/Psych: Mood is stable, no new changes  All other systems were reviewed with the patient and are negative.   VITALS:  Blood pressure 126/62, pulse 79, temperature 98.4 F (36.9 C), temperature source Oral, resp. rate 18, height '5\' 3"'$  (1.6 m), weight 151 lb 9.6 oz (68.8 kg), SpO2 98 %.  Wt Readings from Last 3 Encounters:  05/20/22 151 lb 9.6 oz (68.8 kg)  04/19/22 151 lb (68.5 kg)  04/14/22 151 lb (68.5 kg)    Body mass index is 26.85 kg/m.  Performance status (ECOG): 1 - Symptomatic but completely ambulatory  PHYSICAL EXAM:   GENERAL:alert, no distress and comfortable SKIN: skin color, texture, turgor are normal, no rashes or significant lesions EYES: normal, Conjunctiva are pink and non-injected, sclera clear OROPHARYNX:no exudate, no erythema and lips, buccal mucosa, and tongue normal  NECK: supple, thyroid normal size, non-tender, without nodularity LYMPH:  no palpable lymphadenopathy in the cervical, axillary or inguinal LUNGS: clear to auscultation and percussion with normal breathing effort HEART: regular rate & rhythm and no murmurs and no lower extremity edema ABDOMEN:abdomen soft, non-tender and normal bowel sounds Musculoskeletal:no cyanosis of digits and no clubbing  NEURO: alert & oriented x 3 with fluent speech, no focal motor/sensory deficits  LABORATORY DATA:  I have reviewed the data as listed    Component Value Date/Time   NA 141 10/07/2021 0904   NA 139 05/10/2017 1004   NA 141 10/04/2013 1214   K 4.0 10/07/2021 0904   K 4.3 10/04/2013 1214   CL 108 10/07/2021 0904   CO2 27 10/07/2021 0904   CO2 23 10/04/2013 1214   GLUCOSE 107 (H)  10/07/2021 0904   GLUCOSE 103 10/04/2013 1214   BUN 10 10/07/2021 0904   BUN 14 05/10/2017 1004   BUN 11.9 10/04/2013 1214   CREATININE 0.86 10/07/2021 0904   CREATININE 0.8 10/04/2013 1214   CALCIUM 8.9 10/07/2021 0904   CALCIUM 9.5 10/04/2013 1214   PROT 6.3 (L) 06/10/2020 1715   PROT 7.1 10/04/2013 1214   ALBUMIN 3.4 (L) 06/10/2020 1715   ALBUMIN 3.4 (L) 10/04/2013 1214   AST 12 (L) 06/10/2020 1715   AST 15 10/04/2013 1214   ALT 11 06/10/2020 1715   ALT 12 10/04/2013 1214   ALKPHOS 84 06/10/2020 1715   ALKPHOS 122 10/04/2013 1214   BILITOT 0.4 06/10/2020 1715   BILITOT 0.25 10/04/2013 1214   GFRNONAA >60 10/07/2021 0904   GFRAA >60 09/06/2020 0058    No results found for: SPEP, UPEP  Lab Results  Component Value Date   WBC 4.1 05/20/2022   NEUTROABS 4.53 05/20/2022   NEUTROABS 2.58 05/20/2022   HGB 13.4 05/20/2022   HCT 42 05/20/2022   MCV 93.1 10/07/2021  PLT 209 05/20/2022      Chemistry      Component Value Date/Time   NA 141 10/07/2021 0904   NA 139 05/10/2017 1004   NA 141 10/04/2013 1214   K 4.0 10/07/2021 0904   K 4.3 10/04/2013 1214   CL 108 10/07/2021 0904   CO2 27 10/07/2021 0904   CO2 23 10/04/2013 1214   BUN 10 10/07/2021 0904   BUN 14 05/10/2017 1004   BUN 11.9 10/04/2013 1214   CREATININE 0.86 10/07/2021 0904   CREATININE 0.8 10/04/2013 1214      Component Value Date/Time   CALCIUM 8.9 10/07/2021 0904   CALCIUM 9.5 10/04/2013 1214   ALKPHOS 84 06/10/2020 1715   ALKPHOS 122 10/04/2013 1214   AST 12 (L) 06/10/2020 1715   AST 15 10/04/2013 1214   ALT 11 06/10/2020 1715   ALT 12 10/04/2013 1214   BILITOT 0.4 06/10/2020 1715   BILITOT 0.25 10/04/2013 1214       RADIOGRAPHIC STUDIES: I have personally reviewed the radiological images as listed and agreed with the findings in the report. No results found.

## 2022-05-21 LAB — THYROID PANEL WITH TSH
Free Thyroxine Index: 2.3 (ref 1.2–4.9)
T3 Uptake Ratio: 28 % (ref 24–39)
T4, Total: 8.2 ug/dL (ref 4.5–12.0)
TSH: 0.574 u[IU]/mL (ref 0.450–4.500)

## 2022-05-25 ENCOUNTER — Ambulatory Visit: Payer: Medicare Other | Admitting: Physician Assistant

## 2022-05-25 ENCOUNTER — Encounter: Payer: Self-pay | Admitting: Physician Assistant

## 2022-05-25 DIAGNOSIS — F4310 Post-traumatic stress disorder, unspecified: Secondary | ICD-10-CM | POA: Diagnosis not present

## 2022-05-25 DIAGNOSIS — F419 Anxiety disorder, unspecified: Secondary | ICD-10-CM

## 2022-05-25 DIAGNOSIS — F250 Schizoaffective disorder, bipolar type: Secondary | ICD-10-CM

## 2022-05-25 DIAGNOSIS — F5105 Insomnia due to other mental disorder: Secondary | ICD-10-CM

## 2022-05-25 DIAGNOSIS — F99 Mental disorder, not otherwise specified: Secondary | ICD-10-CM

## 2022-05-25 DIAGNOSIS — T1491XA Suicide attempt, initial encounter: Secondary | ICD-10-CM

## 2022-05-25 MED ORDER — MIRTAZAPINE 7.5 MG PO TABS: 7.5000 mg | ORAL_TABLET | Freq: Every evening | ORAL | 0 refills | Status: DC | PRN
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
## 2022-05-26 ENCOUNTER — Other Ambulatory Visit: Payer: Self-pay | Admitting: Hematology and Oncology

## 2022-05-27 ENCOUNTER — Telehealth: Payer: Self-pay

## 2022-05-27 NOTE — Telephone Encounter (Addendum)
05/27/22 Melissa P,NP: Then she can have a 3 month follow up with labs 05/27/22 Estell Puccini,RN: Pt notified of below. She states she doesn't have another appt scheduled. I dont see anything pending. I will send message to Melissa,NP (out sick today). 05/26/22 Melissa P.,NP:  let her know I will draw it with next visit, but she can continue with monthly B12. 05/26/22 - Pt asking what her B12 results were?

## 2022-05-31 ENCOUNTER — Telehealth: Payer: Self-pay | Admitting: Hematology and Oncology

## 2022-05-31 NOTE — Telephone Encounter (Signed)
Contacted pt to schedule an appt for 70mfollow-up. Unable to reach via phone, voicemail was left.   Scheduling Message Entered by CHighland AMY W on 05/28/2022 at  3:55 PM Priority: High EST PT 30  Department: [facility]  Provider: PMelodye Ped NP  Appointment Notes:  schedule labs and f/u  Scheduling Notes:

## 2022-06-01 ENCOUNTER — Encounter: Payer: Self-pay | Admitting: Hematology and Oncology

## 2022-06-07 DIAGNOSIS — M5412 Radiculopathy, cervical region: Secondary | ICD-10-CM | POA: Diagnosis not present

## 2022-06-08 DIAGNOSIS — K580 Irritable bowel syndrome with diarrhea: Secondary | ICD-10-CM | POA: Diagnosis not present

## 2022-06-08 DIAGNOSIS — D509 Iron deficiency anemia, unspecified: Secondary | ICD-10-CM | POA: Diagnosis not present

## 2022-06-08 DIAGNOSIS — M542 Cervicalgia: Secondary | ICD-10-CM | POA: Diagnosis not present

## 2022-06-08 DIAGNOSIS — N3281 Overactive bladder: Secondary | ICD-10-CM | POA: Diagnosis not present

## 2022-06-16 ENCOUNTER — Other Ambulatory Visit: Payer: Self-pay | Admitting: Physician Assistant

## 2022-06-22 ENCOUNTER — Encounter: Payer: Self-pay | Admitting: Hematology and Oncology

## 2022-06-22 ENCOUNTER — Encounter: Payer: Self-pay | Admitting: Physician Assistant

## 2022-06-22 ENCOUNTER — Ambulatory Visit (INDEPENDENT_AMBULATORY_CARE_PROVIDER_SITE_OTHER): Payer: Medicare Other | Admitting: Physician Assistant

## 2022-06-22 DIAGNOSIS — T1491XA Suicide attempt, initial encounter: Secondary | ICD-10-CM

## 2022-06-22 DIAGNOSIS — F5105 Insomnia due to other mental disorder: Secondary | ICD-10-CM | POA: Diagnosis not present

## 2022-06-22 DIAGNOSIS — F250 Schizoaffective disorder, bipolar type: Secondary | ICD-10-CM | POA: Diagnosis not present

## 2022-06-22 DIAGNOSIS — F4310 Post-traumatic stress disorder, unspecified: Secondary | ICD-10-CM

## 2022-06-22 DIAGNOSIS — F419 Anxiety disorder, unspecified: Secondary | ICD-10-CM | POA: Diagnosis not present

## 2022-06-22 DIAGNOSIS — F99 Mental disorder, not otherwise specified: Secondary | ICD-10-CM

## 2022-06-22 MED ORDER — RISPERIDONE 2 MG PO TABS: 2.0000 mg | ORAL_TABLET | Freq: Every day | ORAL | 1 refills | Status: DC

## 2022-06-28 ENCOUNTER — Telehealth: Payer: Self-pay | Admitting: Genetic Counselor

## 2022-06-28 NOTE — Telephone Encounter (Signed)
Called patients to answer questions about estimated OOP costs for genetic testing and process of genetic counseling/testing.

## 2022-06-30 DIAGNOSIS — N200 Calculus of kidney: Secondary | ICD-10-CM | POA: Diagnosis not present

## 2022-06-30 DIAGNOSIS — N3281 Overactive bladder: Secondary | ICD-10-CM | POA: Diagnosis not present

## 2022-07-02 ENCOUNTER — Other Ambulatory Visit: Payer: Self-pay | Admitting: Genetic Counselor

## 2022-07-02 ENCOUNTER — Inpatient Hospital Stay: Payer: Medicare Other

## 2022-07-02 ENCOUNTER — Encounter: Payer: Self-pay | Admitting: Hematology and Oncology

## 2022-07-02 ENCOUNTER — Telehealth: Payer: Self-pay

## 2022-07-02 ENCOUNTER — Inpatient Hospital Stay: Payer: Medicare Other | Attending: Hematology and Oncology | Admitting: Genetic Counselor

## 2022-07-02 DIAGNOSIS — Z8041 Family history of malignant neoplasm of ovary: Secondary | ICD-10-CM

## 2022-07-02 DIAGNOSIS — Z8051 Family history of malignant neoplasm of kidney: Secondary | ICD-10-CM

## 2022-07-02 NOTE — Telephone Encounter (Signed)
Opened in error
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
## 2022-07-02 NOTE — Telephone Encounter (Addendum)
RE: Req her Iron labs to be checked before August Received: Today [name] Ped, NP  Dairl Ponder, RN She can come in whatever day works best for her next week. I'm off starting Thursday.   Aundrea Horace,RN @ 1410 - Pt has called to request having her iron levels checked again before August. She "is feeling extremely fatigued". Last done 05/20/22; last iron 04/19/2022. Message sent to [facility] P,NP.  Archibald Marchetta,RN: Pt notified of below and verbalized understanding.  Melissa,NA: Sure, that's fine   Manuela Schwartz, RPH: ok good.  There isn't any data on doing this, but if ok with Melissa, she could try just administering 0.23m instead of 165mand see if that helps her symptoms.  Talani Brazee,RN: She is taking the injections monthly now.  SuManuela Schwartz,RPH: headache is common with incidence of 4 to 20%.   I don't see that fatigue has been reported.  GI side effects have been reported but not specifically abdominal bloating.  Can you confirm with her how she is administering it and how often?  Pt reporting that she is having extreme fatigue, headaches, and abd bloating 2 days after each shot. She wonders if this is coincidence or from initial loading dosages? I sent the above message to Susan,RPH, and Melissa P,NP.

## 2022-07-05 ENCOUNTER — Encounter: Payer: Self-pay | Admitting: Hematology and Oncology

## 2022-07-05 ENCOUNTER — Encounter: Payer: Self-pay | Admitting: Genetic Counselor

## 2022-07-05 DIAGNOSIS — Z8041 Family history of malignant neoplasm of ovary: Secondary | ICD-10-CM

## 2022-07-05 DIAGNOSIS — Z8051 Family history of malignant neoplasm of kidney: Secondary | ICD-10-CM

## 2022-07-05 HISTORY — DX: Family history of malignant neoplasm of kidney: Z80.51

## 2022-07-05 HISTORY — DX: Family history of malignant neoplasm of ovary: Z80.41

## 2022-07-12 DIAGNOSIS — D509 Iron deficiency anemia, unspecified: Secondary | ICD-10-CM | POA: Diagnosis not present

## 2022-07-20 ENCOUNTER — Ambulatory Visit (INDEPENDENT_AMBULATORY_CARE_PROVIDER_SITE_OTHER): Payer: Medicare Other | Admitting: Physician Assistant

## 2022-07-20 ENCOUNTER — Encounter: Payer: Self-pay | Admitting: Physician Assistant

## 2022-07-20 DIAGNOSIS — F3341 Major depressive disorder, recurrent, in partial remission: Secondary | ICD-10-CM

## 2022-07-20 DIAGNOSIS — F250 Schizoaffective disorder, bipolar type: Secondary | ICD-10-CM | POA: Diagnosis not present

## 2022-07-20 DIAGNOSIS — T1491XA Suicide attempt, initial encounter: Secondary | ICD-10-CM

## 2022-07-20 DIAGNOSIS — F99 Mental disorder, not otherwise specified: Secondary | ICD-10-CM

## 2022-07-20 DIAGNOSIS — F5105 Insomnia due to other mental disorder: Secondary | ICD-10-CM

## 2022-07-20 DIAGNOSIS — F419 Anxiety disorder, unspecified: Secondary | ICD-10-CM | POA: Diagnosis not present

## 2022-07-20 DIAGNOSIS — F4310 Post-traumatic stress disorder, unspecified: Secondary | ICD-10-CM | POA: Diagnosis not present

## 2022-07-20 MED ORDER — BUSPIRONE HCL 15 MG PO TABS
15.0000 mg | ORAL_TABLET | Freq: Three times a day (TID) | ORAL | 1 refills | Status: DC
Start: 2022-07-20 — End: 2022-12-13

## 2022-07-20 MED ORDER — RISPERIDONE 3 MG PO TABS
1.5000 mg | ORAL_TABLET | Freq: Every day | ORAL | 1 refills | Status: DC
Start: 2022-07-20 — End: 2022-12-13

## 2022-07-20 MED ORDER — TRAZODONE HCL 150 MG PO TABS
150.0000 mg | ORAL_TABLET | Freq: Every evening | ORAL | 1 refills | Status: DC | PRN
Start: 2022-07-20 — End: 2022-12-08

## 2022-07-20 MED ORDER — RISPERIDONE 1 MG PO TABS: 1.0000 mg | ORAL_TABLET | Freq: Every day | ORAL | 1 refills | Status: DC
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
## 2022-07-23 ENCOUNTER — Telehealth: Payer: Self-pay | Admitting: Genetic Counselor

## 2022-07-23 ENCOUNTER — Ambulatory Visit: Payer: Self-pay | Admitting: Genetic Counselor

## 2022-07-23 ENCOUNTER — Encounter: Payer: Self-pay | Admitting: Genetic Counselor

## 2022-07-23 DIAGNOSIS — Z1379 Encounter for other screening for genetic and chromosomal anomalies: Secondary | ICD-10-CM | POA: Insufficient documentation

## 2022-07-23 DIAGNOSIS — Z8051 Family history of malignant neoplasm of kidney: Secondary | ICD-10-CM

## 2022-07-23 DIAGNOSIS — Z8041 Family history of malignant neoplasm of ovary: Secondary | ICD-10-CM

## 2022-07-23 NOTE — Telephone Encounter (Signed)
Revealed negative genetic testing.  Discussed that we do not know why there is cancer in the family. It could be sporadic/famillial, due to a change in a gene that she did not inherit, due to a different gene that we are not testing, or maybe our current technology may not be able to pick something up.  It will be important for her to keep in contact with genetics to keep up with whether additional testing may be needed.  Recommended GT for mother or, if unavailable, sister.

## 2022-07-23 NOTE — Progress Notes (Signed)
HPI:   [name] [name] was previously seen in the [place] clinic due to a family history of ovarian and kidney cancer in her mother and concerns regarding a hereditary predisposition to cancer. Please refer to our prior cancer genetics clinic note for more information regarding our discussion, assessment and recommendations, at the time. [name] [name]'s recent genetic test results were disclosed to her, as were recommendations warranted by these results. These results and recommendations are discussed in more detail below.  CANCER HISTORY:  In 2016, around the age of 51, [name] [name] was diagnosed with basal cell carcinoma on her shoulder.  She does not have a personal history of other cancers.   FAMILY HISTORY:  We obtained a detailed, 4-generation family history.  Significant diagnoses are listed below:      Family History  Problem Relation Age of Onset   Kidney cancer Mother 82   Ovarian cancer Mother 67       [name] [name] is unaware of previous family history of genetic testing for hereditary cancer risks.  Her mother is unavailable for genetic testing. There is no reported Ashkenazi Jewish ancestry. There is no known consanguinity.  GENETIC TEST RESULTS:  The [facility] CancerNext-Expanded +RNAinsight Panel found no pathogenic mutations. The CancerNext-Expanded gene panel offered by [facility] and includes sequencing, rearrangement, and RNA analysis for the following 77 genes: AIP, ALK, APC, ATM, AXIN2, BAP1, BARD1, BLM, BMPR1A, BRCA1, BRCA2, BRIP1, CDC73, CDH1, CDK4, CDKN1B, CDKN2A, CHEK2, CTNNA1, DICER1, FANCC, FH, FLCN, GALNT12, KIF1B, LZTR1, MAX, MEN1, MET, MLH1, MSH2, MSH3, MSH6, MUTYH, NBN, NF1, NF2, NTHL1, PALB2, PHOX2B, PMS2, POT1, PRKAR1A, PTCH1, PTEN, RAD51C, RAD51D, RB1, RECQL, RET, SDHA, SDHAF2, SDHB, SDHC, SDHD, SMAD4, SMARCA4, SMARCB1, SMARCE1, STK11, SUFU, TMEM127, TP53, TSC1, TSC2, VHL and XRCC2 (sequencing and deletion/duplication); EGFR, EGLN1, HOXB13, KIT,  MITF, PDGFRA, POLD1, and POLE (sequencing only); EPCAM and GREM1 (deletion/duplication only).   The test report has been scanned into EPIC and is located under the Molecular Pathology section of the Results Review tab.  A portion of the result report is included below for reference. Genetic testing reported out on July 19, 2022.      Even though a pathogenic variant was not identified, possible explanations for the cancer in the family may include: There may be no hereditary risk for cancer in the family. The cancers in [name] [name]'s family may be sporadic/familial or due to other genetic and environmental factors. There may be a gene mutation in one of these genes that current testing methods cannot detect but that chance is small. There could be another gene that has not yet been discovered, or that we have not yet tested, that is responsible for the cancer diagnoses in the family.  It is also possible there is a hereditary cause for the cancer in the family that [name] [name] did not inherit.  Therefore, it is important to remain in touch with cancer genetics in the future so that we can continue to offer [name] [name] the most up to date genetic testing.     ADDITIONAL GENETIC TESTING:  We discussed with [name] [name] that her genetic testing was fairly extensive.  If there are additional relevant genes identified to increase cancer risk that can be analyzed in the future, we would be happy to discuss and coordinate this testing at that time.    CANCER SCREENING RECOMMENDATIONS:  [name] [name]'s test result is considered negative (normal).  This means that we have not identified a hereditary cause for her  family history of cancer at this time.   An individual's cancer risk and medical management are not determined by genetic test results alone. Overall cancer risk assessment incorporates additional factors, including personal medical history, family history, and any available genetic  information that may result in a personalized plan for cancer prevention and surveillance. Therefore, it is recommended she continue to follow the cancer management and screening guidelines provided by her oncology and primary healthcare provider.  RECOMMENDATIONS FOR FAMILY MEMBERS:   Since she did not inherit a identifiable mutation in a cancer predisposition gene included on this panel, her children could not have inherited a known mutation from her in one of these genes. Individuals in this family might be at some increased risk of developing cancer, over the general population risk, due to the family history of cancer.  Individuals in the family should notify their providers of the family history of cancer. We recommend women in this family have a yearly mammogram beginning at age 71, or 17 years younger than the earliest onset of cancer, an annual clinical breast exam, and perform monthly breast self-exams.   Other members of the family may still carry a pathogenic variant in one of these genes that [name] [name] did not inherit. Based on the family history, we recommend her mother, who was diagnosed with kidney and ovarian cancer, have genetic counseling and testing.  [name] [name] is not in contact with her mother.  We recommended that if her mother is unavailable for genetic testing, [name] [name]'s sister have counseling/testing. [name] [name] will let us know if we can be of any assistance in coordinating genetic counseling and/or testing for this family member.     FOLLOW-UP:  Lastly, we discussed with [name] [name] that cancer genetics is a rapidly advancing field and it is possible that new genetic tests will be appropriate for her and/or her family members in the future. We encouraged her to remain in contact with cancer genetics on an annual basis so we can update her personal and family histories and let her know of advances in cancer genetics that may benefit this family.   Our contact number  was provided. Ms. Dufrane's questions were answered to her satisfaction, and she knows she is welcome to call us at anytime with additional questions or concerns.   Yazan Gatling M. Joette Catching, [place], [facility] Genetic Counselor [email or web address] (P) [phone]

## 2022-07-26 ENCOUNTER — Encounter: Payer: Self-pay | Admitting: Hematology and Oncology

## 2022-07-27 DIAGNOSIS — N261 Atrophy of kidney (terminal): Secondary | ICD-10-CM | POA: Diagnosis not present

## 2022-07-27 DIAGNOSIS — R350 Frequency of micturition: Secondary | ICD-10-CM | POA: Diagnosis not present

## 2022-07-27 DIAGNOSIS — N202 Calculus of kidney with calculus of ureter: Secondary | ICD-10-CM | POA: Diagnosis not present

## 2022-08-10 DIAGNOSIS — N281 Cyst of kidney, acquired: Secondary | ICD-10-CM | POA: Diagnosis not present

## 2022-08-13 DIAGNOSIS — D509 Iron deficiency anemia, unspecified: Secondary | ICD-10-CM | POA: Diagnosis not present

## 2022-08-18 ENCOUNTER — Other Ambulatory Visit: Payer: Self-pay

## 2022-08-18 ENCOUNTER — Other Ambulatory Visit: Payer: Self-pay | Admitting: Hematology and Oncology

## 2022-08-18 DIAGNOSIS — D509 Iron deficiency anemia, unspecified: Secondary | ICD-10-CM

## 2022-08-19 ENCOUNTER — Telehealth: Payer: Self-pay

## 2022-08-19 ENCOUNTER — Encounter: Payer: Self-pay | Admitting: Hematology and Oncology

## 2022-08-19 ENCOUNTER — Inpatient Hospital Stay: Payer: Medicare Other | Attending: Hematology and Oncology | Admitting: Hematology and Oncology

## 2022-08-19 ENCOUNTER — Inpatient Hospital Stay: Payer: Medicare Other

## 2022-08-19 DIAGNOSIS — D509 Iron deficiency anemia, unspecified: Secondary | ICD-10-CM

## 2022-08-19 DIAGNOSIS — D649 Anemia, unspecified: Secondary | ICD-10-CM | POA: Diagnosis not present

## 2022-08-19 LAB — VITAMIN B12: Vitamin B-12: 430 pg/mL (ref 180–914)

## 2022-08-19 LAB — CBC AND DIFFERENTIAL
HCT: 39 (ref 36–46)
Hemoglobin: 12.9 (ref 12.0–16.0)
Neutrophils Absolute: 1.76
Platelets: 256 10*3/uL (ref 150–400)
WBC: 3.2

## 2022-08-19 LAB — IRON AND TIBC
Iron: 112 ug/dL (ref 28–170)
Saturation Ratios: 40 % — ABNORMAL HIGH (ref 10.4–31.8)
TIBC: 281 ug/dL (ref 250–450)
UIBC: 169 ug/dL

## 2022-08-19 LAB — COMPREHENSIVE METABOLIC PANEL
Albumin: 4.1 (ref 3.5–5.0)
Calcium: 9 (ref 8.7–10.7)

## 2022-08-19 LAB — BASIC METABOLIC PANEL
BUN: 9 (ref 4–21)
CO2: 29 — AB (ref 13–22)
Chloride: 107 (ref 99–108)
Creatinine: 0.7 (ref 0.5–1.1)
Glucose: 79
Potassium: 3.9 mEq/L (ref 3.5–5.1)
Sodium: 140 (ref 137–147)

## 2022-08-19 LAB — FERRITIN: Ferritin: 61 ng/mL (ref 11–307)

## 2022-08-19 LAB — CBC: RBC: 4.13 (ref 3.87–5.11)

## 2022-08-19 LAB — HEPATIC FUNCTION PANEL
ALT: 10 U/L (ref 7–35)
AST: 17 (ref 13–35)
Alkaline Phosphatase: 52 (ref 25–125)
Bilirubin, Total: 0.4

## 2022-08-19 NOTE — Telephone Encounter (Signed)
Patient made aware and voiced understanding.

## 2022-08-19 NOTE — Telephone Encounter (Signed)
-----   Message from Melodye Ped, NP sent at 08/19/2022  1:12 PM EDT ----- Can you let her know that her B12 level is 430 which is very good. I was unable to change her B12 prescription, so she can continue to divide the doses as she has been doing.   Thank you, Lenna Sciara

## 2022-08-19 NOTE — Assessment & Plan Note (Addendum)
51 y.o. female with long standing history of fatigue. She was worked up by her PCP and found to have a decreased ferritin and B12.  Hemoglobin was 11.9, ferritin was 7, saturation was 15 and vitamin B12 was slightly decreased at 171. She received Feraheme x 2 doses and Vitamin B12 weekly x 4. She continues to do well in regards to her iron deficiency with hemoglobin 12.9 today. Her PCP advised that she divide her monthly B12 dosing into weekly as there were interactions with her Risperdal. Previous thyroid studies for complaints of fatigue and cold intolerance were found to be normal. She is scheduled for CT imaging with Dr. Nila Nephew for evaluation of kidney cysts next week.  We will see her back in 3 months for repeat evaluation.

## 2022-08-19 NOTE — Progress Notes (Cosign Needed)
Patient Care Team: Marga Hoots, NP as PCP - General (Adult Health Nurse Practitioner) Melodye Ped, NP as PCP - Hematology/Oncology (Hematology and Oncology) Heath Lark, MD as Consulting Physician (Hematology and Oncology)  Clinic Day:  08/19/2022  Referring physician: Carvel Getting Key, *  ASSESSMENT & PLAN:   Assessment & Plan: Iron deficiency anemia 51 y.o. female with long standing history of fatigue. She was worked up by her PCP and found to have a decreased ferritin and B12.  Hemoglobin was 11.9, ferritin was 7, saturation was 15 and vitamin B12 was slightly decreased at 171. She received Feraheme x 2 doses and Vitamin B12 weekly x 4. She continues to do well in regards to her iron deficiency with hemoglobin 12.9 today. Her PCP advised that she divide her monthly B12 dosing into weekly as there were interactions with her Risperdal. Previous thyroid studies for complaints of fatigue and cold intolerance were found to be normal. She is scheduled for CT imaging with Dr. Nila Nephew for evaluation of kidney cysts next week.  We will see her back in 3 months for repeat evaluation.     The patient understands the plans discussed today and is in agreement with them.  She knows to contact our office if she develops concerns prior to her next appointment.    Melodye Ped, NP  [facility] [address] [place] [address] Dept: [phone] Dept Fax: [phone]   Orders Placed This Encounter  Procedures   CBC and differential    This external order was created through the Results Console.   CBC    This external order was created through the Results Console.   Basic metabolic panel    This external order was created through the Results Console.   Comprehensive metabolic panel    This external order was created through the Results Console.   Hepatic function panel    This external order was  created through the Results Console.      CHIEF COMPLAINT:  CC: A 51 year old female with history of iron deficiency anemia here for 3 month evaluation  Current Treatment:  Surveillance  INTERVAL HISTORY:  Baylynn is here today for repeat clinical assessment. She denies fevers or chills. She denies pain. Her appetite is good. Her weight has been stable.  I have reviewed the past medical history, past surgical history, social history and family history with the patient and they are unchanged from previous note.  ALLERGIES:  is allergic to augmentin [amoxicillin-pot clavulanate], enoxaparin, avelox [moxifloxacin hcl in nacl], penicillins, sodium hydroxide, and avelox [moxifloxacin].  MEDICATIONS:  Current Outpatient Medications  Medication Sig Dispense Refill   cyanocobalamin (,VITAMIN B-12,) 1000 MCG/ML injection Inject 1 mL (1,000 mcg total) into the muscle every 30 (thirty) days. Please include 12 (twelve) 25G 1-1.5" syringes (Patient taking differently: Inject 1,000 mcg into the muscle every 30 (thirty) days. Please include 12 (twelve) 25G 1-1.5" syringes Patient dividing dose into weekly doses) 12 mL 0   acetaminophen (TYLENOL) 500 MG tablet Take by mouth every 8 (eight) hours as needed.     busPIRone (BUSPAR) 15 MG tablet Take 1 tablet (15 mg total) by mouth 3 (three) times daily. 270 tablet 1   cyclobenzaprine (FLEXERIL) 5 MG tablet SMARTSIG:1 Tablet(s) By Mouth 1-3 Times Daily PRN (Patient not taking: Reported on 06/22/2022)     dicyclomine (BENTYL) 20 MG tablet Take 20 mg by mouth 3 (three) times daily before meals.  famotidine (PEPCID) 20 MG tablet Take 20 mg by mouth 2 (two) times daily.     fesoterodine (TOVIAZ) 8 MG TB24 tablet Take 8 mg by mouth daily.     gabapentin (NEURONTIN) 300 MG capsule Take 1 capsule every day by oral route.     Mefenamic Acid 250 MG CAPS Take 1 capsule by mouth every 6 (six) hours as needed.     mirabegron ER (MYRBETRIQ) 50 MG TB24 tablet Take 50 mg  by mouth at bedtime.     Multiple Vitamin (MULTI-VITAMIN DAILY PO) Multi Vitamin (Patient not taking: Reported on 05/25/2022)     Probiotic Product (PROBIOTIC DAILY PO) Take by mouth.     risperiDONE (RISPERDAL) 1 MG tablet Take 1 tablet (1 mg total) by mouth daily. Q AM 90 tablet 1   risperiDONE (RISPERDAL) 3 MG tablet Take 0.5 tablets (1.5 mg total) by mouth at bedtime. 45 tablet 1   traZODone (DESYREL) 150 MG tablet Take 1 tablet (150 mg total) by mouth at bedtime as needed for sleep. 90 tablet 1   No current facility-administered medications for this visit.    HISTORY OF PRESENT ILLNESS:   Oncology History   No history exists.      REVIEW OF SYSTEMS:   Constitutional: Denies fevers, chills or abnormal weight loss Eyes: Denies blurriness of vision Ears, nose, mouth, throat, and face: Denies mucositis or sore throat Respiratory: Denies cough, dyspnea or wheezes Cardiovascular: Denies palpitation, chest discomfort or lower extremity swelling Gastrointestinal:  Denies nausea, heartburn or change in bowel habits Skin: Denies abnormal skin rashes Lymphatics: Denies new lymphadenopathy or easy bruising Neurological:Denies numbness, tingling or new weaknesses Behavioral/Psych: Mood is stable, no new changes  All other systems were reviewed with the patient and are negative.   VITALS:  Blood pressure (!) 149/70, pulse 69, temperature 97.8 F (36.6 C), temperature source Oral, resp. rate 20, height '5\' 3"'$  (1.6 m), weight 153 lb (69.4 kg), SpO2 100 %.  Wt Readings from Last 3 Encounters:  08/19/22 153 lb (69.4 kg)  05/20/22 151 lb 9.6 oz (68.8 kg)  04/19/22 151 lb (68.5 kg)    Body mass index is 27.1 kg/m.  Performance status (ECOG): 1 - Symptomatic but completely ambulatory  PHYSICAL EXAM:   GENERAL:alert, no distress and comfortable SKIN: skin color, texture, turgor are normal, no rashes or significant lesions EYES: normal, Conjunctiva are pink and non-injected, sclera  clear OROPHARYNX:no exudate, no erythema and lips, buccal mucosa, and tongue normal  NECK: supple, thyroid normal size, non-tender, without nodularity LYMPH:  no palpable lymphadenopathy in the cervical, axillary or inguinal LUNGS: clear to auscultation and percussion with normal breathing effort HEART: regular rate & rhythm and no murmurs and no lower extremity edema ABDOMEN:abdomen soft, non-tender and normal bowel sounds Musculoskeletal:no cyanosis of digits and no clubbing  NEURO: alert & oriented x 3 with fluent speech, no focal motor/sensory deficits  LABORATORY DATA:  I have reviewed the data as listed    Component Value Date/Time   NA 140 08/19/2022 0000   NA 141 10/04/2013 1214   K 3.9 08/19/2022 0000   K 4.3 10/04/2013 1214   CL 107 08/19/2022 0000   CO2 29 (A) 08/19/2022 0000   CO2 23 10/04/2013 1214   GLUCOSE 107 (H) 10/07/2021 0904   GLUCOSE 103 10/04/2013 1214   BUN 9 08/19/2022 0000   BUN 11.9 10/04/2013 1214   CREATININE 0.7 08/19/2022 0000   CREATININE 0.86 10/07/2021 0904   CREATININE 0.8 10/04/2013 1214  CALCIUM 9.0 08/19/2022 0000   CALCIUM 9.5 10/04/2013 1214   PROT 6.3 (L) 06/10/2020 1715   PROT 7.1 10/04/2013 1214   ALBUMIN 4.1 08/19/2022 0000   ALBUMIN 3.4 (L) 10/04/2013 1214   AST 17 08/19/2022 0000   AST 15 10/04/2013 1214   ALT 10 08/19/2022 0000   ALT 12 10/04/2013 1214   ALKPHOS 52 08/19/2022 0000   ALKPHOS 122 10/04/2013 1214   BILITOT 0.4 06/10/2020 1715   BILITOT 0.25 10/04/2013 1214   GFRNONAA >60 10/07/2021 0904   GFRAA >60 09/06/2020 0058    No results found for: "SPEP", "UPEP"  Lab Results  Component Value Date   WBC 3.2 08/19/2022   NEUTROABS 1.76 08/19/2022   HGB 12.9 08/19/2022   HCT 39 08/19/2022   MCV 93.1 10/07/2021   PLT 256 08/19/2022      Chemistry      Component Value Date/Time   NA 140 08/19/2022 0000   NA 141 10/04/2013 1214   K 3.9 08/19/2022 0000   K 4.3 10/04/2013 1214   CL 107 08/19/2022 0000   CO2  29 (A) 08/19/2022 0000   CO2 23 10/04/2013 1214   BUN 9 08/19/2022 0000   BUN 11.9 10/04/2013 1214   CREATININE 0.7 08/19/2022 0000   CREATININE 0.86 10/07/2021 0904   CREATININE 0.8 10/04/2013 1214   GLU 79 08/19/2022 0000      Component Value Date/Time   CALCIUM 9.0 08/19/2022 0000   CALCIUM 9.5 10/04/2013 1214   ALKPHOS 52 08/19/2022 0000   ALKPHOS 122 10/04/2013 1214   AST 17 08/19/2022 0000   AST 15 10/04/2013 1214   ALT 10 08/19/2022 0000   ALT 12 10/04/2013 1214   BILITOT 0.4 06/10/2020 1715   BILITOT 0.25 10/04/2013 1214       RADIOGRAPHIC STUDIES: I have personally reviewed the radiological images as listed and agreed with the findings in the report. No results found.

## 2022-08-25 DIAGNOSIS — N261 Atrophy of kidney (terminal): Secondary | ICD-10-CM | POA: Diagnosis not present

## 2022-08-25 DIAGNOSIS — N134 Hydroureter: Secondary | ICD-10-CM | POA: Diagnosis not present

## 2022-08-25 DIAGNOSIS — N1339 Other hydronephrosis: Secondary | ICD-10-CM | POA: Diagnosis not present

## 2022-08-25 DIAGNOSIS — N1330 Unspecified hydronephrosis: Secondary | ICD-10-CM | POA: Diagnosis not present

## 2022-08-25 DIAGNOSIS — N281 Cyst of kidney, acquired: Secondary | ICD-10-CM | POA: Diagnosis not present

## 2022-09-09 DIAGNOSIS — K580 Irritable bowel syndrome with diarrhea: Secondary | ICD-10-CM | POA: Diagnosis not present

## 2022-09-09 DIAGNOSIS — N3281 Overactive bladder: Secondary | ICD-10-CM | POA: Diagnosis not present

## 2022-09-09 DIAGNOSIS — E785 Hyperlipidemia, unspecified: Secondary | ICD-10-CM | POA: Diagnosis not present

## 2022-09-09 DIAGNOSIS — H52203 Unspecified astigmatism, bilateral: Secondary | ICD-10-CM | POA: Diagnosis not present

## 2022-09-09 DIAGNOSIS — D509 Iron deficiency anemia, unspecified: Secondary | ICD-10-CM | POA: Diagnosis not present

## 2022-09-09 DIAGNOSIS — Z79899 Other long term (current) drug therapy: Secondary | ICD-10-CM | POA: Diagnosis not present

## 2022-09-09 DIAGNOSIS — H5213 Myopia, bilateral: Secondary | ICD-10-CM | POA: Diagnosis not present

## 2022-09-14 ENCOUNTER — Ambulatory Visit (INDEPENDENT_AMBULATORY_CARE_PROVIDER_SITE_OTHER): Payer: Medicare Other | Admitting: Physician Assistant

## 2022-09-14 ENCOUNTER — Encounter: Payer: Self-pay | Admitting: Physician Assistant

## 2022-09-14 DIAGNOSIS — F99 Mental disorder, not otherwise specified: Secondary | ICD-10-CM

## 2022-09-14 DIAGNOSIS — F250 Schizoaffective disorder, bipolar type: Secondary | ICD-10-CM | POA: Diagnosis not present

## 2022-09-14 DIAGNOSIS — F4310 Post-traumatic stress disorder, unspecified: Secondary | ICD-10-CM

## 2022-09-14 DIAGNOSIS — F5105 Insomnia due to other mental disorder: Secondary | ICD-10-CM | POA: Diagnosis not present

## 2022-09-14 DIAGNOSIS — T1491XA Suicide attempt, initial encounter: Secondary | ICD-10-CM

## 2022-09-14 DIAGNOSIS — F419 Anxiety disorder, unspecified: Secondary | ICD-10-CM
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
## 2022-09-15 DIAGNOSIS — N261 Atrophy of kidney (terminal): Secondary | ICD-10-CM | POA: Diagnosis not present

## 2022-09-15 DIAGNOSIS — R350 Frequency of micturition: Secondary | ICD-10-CM | POA: Diagnosis not present

## 2022-09-22 ENCOUNTER — Encounter: Payer: Self-pay | Admitting: Gastroenterology

## 2022-09-30 ENCOUNTER — Other Ambulatory Visit: Payer: Self-pay | Admitting: Physician Assistant

## 2022-11-03 ENCOUNTER — Encounter: Payer: Self-pay | Admitting: Gastroenterology

## 2022-11-03 ENCOUNTER — Ambulatory Visit: Payer: Medicare Other | Admitting: Gastroenterology

## 2022-11-03 VITALS — BP 110/88 | HR 83 | Ht 63.0 in | Wt 154.0 lb

## 2022-11-03 DIAGNOSIS — R197 Diarrhea, unspecified: Secondary | ICD-10-CM

## 2022-11-03 DIAGNOSIS — D509 Iron deficiency anemia, unspecified: Secondary | ICD-10-CM | POA: Diagnosis not present

## 2022-11-03 DIAGNOSIS — R109 Unspecified abdominal pain: Secondary | ICD-10-CM

## 2022-11-03 DIAGNOSIS — R140 Abdominal distension (gaseous): Secondary | ICD-10-CM | POA: Diagnosis not present

## 2022-11-03 MED ORDER — NA SULFATE-K SULFATE-MG SULF 17.5-3.13-1.6 GM/177ML PO SOLN: 1.0000 | Freq: Once | ORAL | 0 refills | Status: DC

## 2022-11-03 NOTE — Patient Instructions (Addendum)
I recommend adding Metamucil, Benefiber, or Citrucel every day. Citrucel is associated with less bloating than the other medications.   We discussed colonoscopy and upper endoscopy for further evaluation.  I otherwise did not change your diet at this time.   _______________________________________________________  If you are age 51 or older, your body mass index should be between 23-30. Your Body mass index is 27.28 kg/m. If this is out of the aforementioned range listed, please consider follow up with your Primary Care Provider.  If you are age 65 or younger, your body mass index should be between 19-25. Your Body mass index is 27.28 kg/m. If this is out of the aformentioned range listed, please consider follow up with your Primary Care Provider.   ________________________________________________________  The [facility] providers would like to encourage you to use [facility] to communicate with providers for non-urgent requests or questions.  Due to long hold times on the telephone, sending your provider a message by [facility] may be a faster and more efficient way to get a response.  Please allow 48 business hours for a response.  Please remember that this is for non-urgent requests.  _______________________________________________________
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
## 2022-11-22 ENCOUNTER — Ambulatory Visit: Payer: Self-pay | Admitting: Hematology and Oncology

## 2022-11-22 ENCOUNTER — Other Ambulatory Visit: Payer: Medicare Other

## 2022-11-23 ENCOUNTER — Other Ambulatory Visit: Payer: Self-pay | Admitting: Hematology and Oncology

## 2022-11-23 DIAGNOSIS — E538 Deficiency of other specified B group vitamins: Secondary | ICD-10-CM

## 2022-11-23 DIAGNOSIS — D509 Iron deficiency anemia, unspecified: Secondary | ICD-10-CM

## 2022-11-24 ENCOUNTER — Inpatient Hospital Stay: Payer: Medicare Other | Admitting: Hematology and Oncology

## 2022-11-24 ENCOUNTER — Inpatient Hospital Stay: Payer: Medicare Other | Attending: Hematology and Oncology

## 2022-11-24 ENCOUNTER — Encounter: Payer: Self-pay | Admitting: Hematology and Oncology

## 2022-11-24 ENCOUNTER — Other Ambulatory Visit: Payer: Self-pay

## 2022-11-24 VITALS — BP 139/89 | HR 64 | Temp 98.1°F | Resp 20 | Ht 63.0 in | Wt 154.5 lb

## 2022-11-24 DIAGNOSIS — D509 Iron deficiency anemia, unspecified: Secondary | ICD-10-CM

## 2022-11-24 DIAGNOSIS — E611 Iron deficiency: Secondary | ICD-10-CM | POA: Insufficient documentation

## 2022-11-24 DIAGNOSIS — E538 Deficiency of other specified B group vitamins: Secondary | ICD-10-CM | POA: Insufficient documentation

## 2022-11-24 DIAGNOSIS — Z79899 Other long term (current) drug therapy: Secondary | ICD-10-CM | POA: Diagnosis not present

## 2022-11-24 LAB — IRON AND TIBC
Iron: 112 ug/dL (ref 28–170)
Saturation Ratios: 37 % — ABNORMAL HIGH (ref 10.4–31.8)
TIBC: 305 ug/dL (ref 250–450)
UIBC: 193 ug/dL

## 2022-11-24 LAB — CBC WITH DIFFERENTIAL (CANCER CENTER ONLY)
Abs Immature Granulocytes: 0.02 10*3/uL (ref 0.00–0.07)
Basophils Absolute: 0 10*3/uL (ref 0.0–0.1)
Basophils Relative: 1 %
Eosinophils Absolute: 0.1 10*3/uL (ref 0.0–0.5)
Eosinophils Relative: 3 %
HCT: 39.8 % (ref 36.0–46.0)
Hemoglobin: 12.7 g/dL (ref 12.0–15.0)
Immature Granulocytes: 1 %
Lymphocytes Relative: 28 %
Lymphs Abs: 1.2 10*3/uL (ref 0.7–4.0)
MCH: 30.8 pg (ref 26.0–34.0)
MCHC: 31.9 g/dL (ref 30.0–36.0)
MCV: 96.4 fL (ref 80.0–100.0)
Monocytes Absolute: 0.4 10*3/uL (ref 0.1–1.0)
Monocytes Relative: 10 %
Neutro Abs: 2.4 10*3/uL (ref 1.7–7.7)
Neutrophils Relative %: 57 %
Platelet Count: 221 10*3/uL (ref 150–400)
RBC: 4.13 MIL/uL (ref 3.87–5.11)
RDW: 12.1 % (ref 11.5–15.5)
WBC Count: 4.2 10*3/uL (ref 4.0–10.5)
nRBC: 0 % (ref 0.0–0.2)

## 2022-11-24 LAB — VITAMIN B12: Vitamin B-12: 477 pg/mL (ref 180–914)

## 2022-11-24 LAB — FERRITIN: Ferritin: 39 ng/mL (ref 11–307)

## 2022-11-24 LAB — TSH: TSH: 0.746 u[IU]/mL (ref 0.350–4.500)

## 2022-11-24 LAB — FOLATE: Folate: 36.2 ng/mL (ref >5.9)

## 2022-11-24 NOTE — Progress Notes (Cosign Needed)
[facility]  [address] [place],  [place]  [address] [phone]  Clinic Day:  11/24/2022  Referring physician: Carvel Getting Key, *   HISTORY OF PRESENT ILLNESS:  The patient is a 51 y.o. female with iron deficiency felt to be most likely related to heavy menses.  She was unable to tolerate oral iron supplement, so was treated with IV Feraheme in April with normalization of her hemoglobin and iron stores.  She was also found to have B12 deficiency and is currently on B12 250 mcg weekly to avoid interaction with her risperidone.  She is here for repeat clinical assessment and states continues to have fatigue not relieved with rest.  More recently she has noted some decreased exercise tolerance with normal hiking.  She is scheduled for EGD and colonoscopy December 28th.  She has a history of irritable bowel syndrome, but has noted occasional dark stool.  She denies bright red blood in the stool.  She continues to have menses every 3 to 5 weeks lasting about 5 to 7 days with 4 days of heavy bleeding with clotting requiring both tampon and pad.  She states she is not up-to-date on pelvic examination and Pap smear.  She has not had a recent pelvic exam or Pap smear.  She also reports cold intolerance, mainly of her hands and feet.  Due to to her family history of malignancy, she underwent testing for hereditary cancer syndromes. The [facility] CancerNext-Expanded +RNAinsight Panel did not reveal any pathogenic mutations or variants of uncertain significance.  PHYSICAL EXAM:  Blood pressure 139/89, pulse 64, temperature 98.1 F (36.7 C), temperature source Oral, resp. rate 20, height '5\' 3"'$  (1.6 m), weight 154 lb 8 oz (70.1 kg), SpO2 100 %. Wt Readings from Last 3 Encounters:  11/24/22 154 lb 8 oz (70.1 kg)  11/03/22 154 lb (69.9 kg)  08/19/22 153 lb (69.4 kg)   Body mass index is 27.37 kg/m.  Performance status (ECOG): 1 - Symptomatic but completely  ambulatory  Physical Exam Vitals and nursing note reviewed.  Constitutional:      General: She is not in acute distress.    Appearance: Normal appearance.  HENT:     Head: Normocephalic and atraumatic.     Mouth/Throat:     Mouth: Mucous membranes are moist.     Pharynx: Oropharynx is clear. No oropharyngeal exudate or posterior oropharyngeal erythema.  Eyes:     General: No scleral icterus.    Extraocular Movements: Extraocular movements intact.     Conjunctiva/sclera: Conjunctivae normal.     Pupils: Pupils are equal, round, and reactive to light.  Cardiovascular:     Rate and Rhythm: Normal rate and regular rhythm.     Heart sounds: Normal heart sounds. No murmur heard.    No friction rub. No gallop.  Pulmonary:     Effort: Pulmonary effort is normal.     Breath sounds: Normal breath sounds. No wheezing, rhonchi or rales.  Abdominal:     General: There is no distension.     Palpations: Abdomen is soft. There is no hepatomegaly, splenomegaly or mass.     Tenderness: There is no abdominal tenderness.  Musculoskeletal:        General: Normal range of motion.     Cervical back: Normal range of motion and neck supple. No tenderness.     Right lower leg: No edema.     Left lower leg: No edema.  Lymphadenopathy:  Cervical: No cervical adenopathy.     Upper Body:     Right upper body: No supraclavicular or axillary adenopathy.     Left upper body: No supraclavicular adenopathy.     Lower Body: No right inguinal adenopathy. No left inguinal adenopathy.  Skin:    General: Skin is warm and dry.     Coloration: Skin is not jaundiced.     Findings: No rash.  Neurological:     Mental Status: She is alert and oriented to person, place, and time.     Cranial Nerves: No cranial nerve deficit.  Psychiatric:        Mood and Affect: Mood normal.        Behavior: Behavior normal.        Thought Content: Thought content normal.    LABS:      Latest Ref Rng & Units 11/24/2022     8:59 AM 08/19/2022   12:00 AM 05/20/2022   12:00 AM  CBC  WBC 4.0 - 10.5 K/uL 4.2  3.2     4.1      Hemoglobin 12.0 - 15.0 g/dL 12.7  12.9     13.4      Hematocrit 36.0 - 46.0 % 39.8  39     42      Platelets 150 - 400 K/uL 221  256     209         This result is from an external source.      Latest Ref Rng & Units 08/19/2022   12:00 AM 10/07/2021    9:04 AM 09/06/2020   12:58 AM  CMP  Glucose 70 - 99 mg/dL  107  107   BUN 4 - '21 9     10  18   '$ Creatinine 0.5 - 1.1 0.7     0.86  0.86   Sodium 137 - 147 140     141  139   Potassium 3.5 - 5.1 mEq/L 3.9     4.0  3.9   Chloride 99 - 108 107     108  104   CO2 13 - '22 29     27  27   '$ Calcium 8.7 - 10.7 9.0     8.9  9.0   Alkaline Phos 25 - 125 52        AST 13 - 35 17        ALT 7 - 35 U/L 10           This result is from an external source.     No results found for: "CEA1", "CEA" / No results found for: "CEA1", "CEA" No results found for: "PSA1" No results found for: "IOX735" No results found for: "CAN125"  No results found for: "TOTALPROTELP", "ALBUMINELP", "A1GS", "A2GS", "BETS", "BETA2SER", "GAMS", "MSPIKE", "SPEI" Lab Results  Component Value Date   TIBC 305 11/24/2022   TIBC 281 08/19/2022   TIBC 279 05/20/2022   FERRITIN 39 11/24/2022   FERRITIN 61 08/19/2022   FERRITIN 159 05/20/2022   IRONPCTSAT 37 (H) 11/24/2022   IRONPCTSAT 40 (H) 08/19/2022   IRONPCTSAT 51 (H) 05/20/2022   Lab Results  Component Value Date   LDH 94 (L) 03/30/2016       Component Value Date/Time   LDH 94 (L) 03/30/2016 1830    Review Flowsheet  More data exists      Latest Ref Rng & Units 05/20/2022 08/19/2022 11/24/2022  Oncology Labs  Ferritin 11 - 307  ng/mL 159  61  39   %SAT 10.4 - 31.8 % 51  40  37      ASSESSMENT & PLAN:   Assessment/Plan:  51 y.o. female with both iron deficiency and B12 deficiency.  She does not have recurrent iron or B12 deficiency at this time.  She continues to have heavy menses, as well as melena.   She is scheduled for an EGD and colonoscopy.  She states she will have her PCP do a pelvic and Pap at her visit with her in December.  Due to her cold intolerance, I added a TSH today which was normal.  I will plan to see her back in 3 months for repeat clinical assessment.  The patient understands all the plans discussed today and is in agreement with them.  She knows to contact our office if she develops concerns prior to her next appointment    Marvia Pickles, PA-C

## 2022-11-25 ENCOUNTER — Telehealth: Payer: Self-pay

## 2022-11-25 NOTE — Telephone Encounter (Signed)
Patient notified and voiced understanding.

## 2022-11-25 NOTE — Telephone Encounter (Signed)
-----   Message from Marvia Pickles, PA-C sent at 11/24/2022  3:51 PM EST ----- Please let her know her hemoglobin, vitamin levels and TSH are normal. Keep appt in 3 months. Thanks

## 2022-11-29 ENCOUNTER — Other Ambulatory Visit: Payer: Self-pay

## 2022-11-29 ENCOUNTER — Telehealth: Payer: Self-pay | Admitting: Gastroenterology

## 2022-11-29 MED ORDER — NA SULFATE-K SULFATE-MG SULF 17.5-3.13-1.6 GM/177ML PO SOLN: 1.0000 | Freq: Once | ORAL | 0 refills | Status: AC

## 2022-11-29 NOTE — Telephone Encounter (Signed)
Patient is calling wanting her prep sent to [facility] in [place]. Please advise

## 2022-11-29 NOTE — Telephone Encounter (Signed)
Prescription sent to [facility] in [place]. Patient made aware.

## 2022-12-07 ENCOUNTER — Other Ambulatory Visit: Payer: Self-pay | Admitting: Physician Assistant

## 2022-12-10 ENCOUNTER — Other Ambulatory Visit: Payer: Self-pay | Admitting: Physician Assistant

## 2022-12-11 DIAGNOSIS — G8929 Other chronic pain: Secondary | ICD-10-CM | POA: Diagnosis not present

## 2022-12-11 DIAGNOSIS — M549 Dorsalgia, unspecified: Secondary | ICD-10-CM | POA: Diagnosis not present

## 2022-12-11 DIAGNOSIS — N3281 Overactive bladder: Secondary | ICD-10-CM | POA: Diagnosis not present

## 2022-12-13 ENCOUNTER — Ambulatory Visit (INDEPENDENT_AMBULATORY_CARE_PROVIDER_SITE_OTHER): Payer: Medicare Other | Admitting: Physician Assistant

## 2022-12-14 ENCOUNTER — Encounter: Payer: Self-pay | Admitting: Gastroenterology

## 2022-12-21 ENCOUNTER — Encounter: Payer: Self-pay | Admitting: Certified Registered Nurse Anesthetist

## 2022-12-22 ENCOUNTER — Other Ambulatory Visit: Payer: Self-pay | Admitting: *Deleted

## 2022-12-22 MED ORDER — ONDANSETRON HCL 4 MG PO TABS: ORAL_TABLET | ORAL | 0 refills | Status: DC

## 2022-12-23 ENCOUNTER — Telehealth: Payer: Self-pay

## 2022-12-23 ENCOUNTER — Encounter: Payer: Self-pay | Admitting: Gastroenterology

## 2022-12-23 ENCOUNTER — Ambulatory Visit (AMBULATORY_SURGERY_CENTER): Payer: Medicare Other | Admitting: Gastroenterology

## 2022-12-23 VITALS — BP 130/50 | HR 68 | Temp 98.2°F | Resp 21 | Ht 63.0 in | Wt 154.0 lb

## 2022-12-23 DIAGNOSIS — K219 Gastro-esophageal reflux disease without esophagitis: Secondary | ICD-10-CM

## 2022-12-23 DIAGNOSIS — K259 Gastric ulcer, unspecified as acute or chronic, without hemorrhage or perforation: Secondary | ICD-10-CM | POA: Diagnosis not present

## 2022-12-23 DIAGNOSIS — D123 Benign neoplasm of transverse colon: Secondary | ICD-10-CM | POA: Diagnosis not present

## 2022-12-23 DIAGNOSIS — R140 Abdominal distension (gaseous): Secondary | ICD-10-CM

## 2022-12-23 DIAGNOSIS — Z9884 Bariatric surgery status: Secondary | ICD-10-CM

## 2022-12-23 DIAGNOSIS — Z1211 Encounter for screening for malignant neoplasm of colon: Secondary | ICD-10-CM | POA: Diagnosis not present

## 2022-12-23 DIAGNOSIS — R197 Diarrhea, unspecified: Secondary | ICD-10-CM | POA: Diagnosis not present

## 2022-12-23 DIAGNOSIS — D120 Benign neoplasm of cecum: Secondary | ICD-10-CM | POA: Diagnosis not present

## 2022-12-23 DIAGNOSIS — K2950 Unspecified chronic gastritis without bleeding: Secondary | ICD-10-CM | POA: Diagnosis not present

## 2022-12-23 DIAGNOSIS — K6389 Other specified diseases of intestine: Secondary | ICD-10-CM | POA: Diagnosis not present

## 2022-12-23 DIAGNOSIS — R109 Unspecified abdominal pain: Secondary | ICD-10-CM

## 2022-12-23 DIAGNOSIS — D509 Iron deficiency anemia, unspecified: Secondary | ICD-10-CM

## 2022-12-23 MED ORDER — PANTOPRAZOLE SODIUM 40 MG PO TBEC: 40.0000 mg | DELAYED_RELEASE_TABLET | Freq: Every day | ORAL | 3 refills | Status: DC

## 2022-12-23 MED ORDER — SODIUM CHLORIDE 0.9 % IV SOLN: 500.0000 mL | INTRAVENOUS | Status: DC

## 2022-12-23 NOTE — Progress Notes (Signed)
Report given to PACU, vss 

## 2022-12-23 NOTE — Op Note (Signed)
[facility] Patient Name: [name] [name] Procedure Date: 12/23/2022 8:33 AM MRN: [id number] Endoscopist: Thornton Park MD, MD, [id number] Age: 51 Referring MD:  Date of Birth: Sep 21, 1971 Gender: Female Account #: [id number] Procedure:                Upper GI endoscopy Indications:              Abdominal pain, Unexplained iron deficiency anemia,                            Abdominal bloating Medicines:                Monitored Anesthesia Care Procedure:                Pre-Anesthesia Assessment:                           - Prior to the procedure, a History and Physical                            was performed, and patient medications and                            allergies were reviewed. The patient's tolerance of                            previous anesthesia was also reviewed. The risks                            and benefits of the procedure and the sedation                            options and risks were discussed with the patient.                            All questions were answered, and informed consent                            was obtained. Prior Anticoagulants: The patient has                            taken no anticoagulant or antiplatelet agents. ASA                            Grade Assessment: II - A patient with mild systemic                            disease. After reviewing the risks and benefits,                            the patient was deemed in satisfactory condition to                            undergo the procedure.  After obtaining informed consent, the endoscope was                            passed under direct vision. Throughout the                            procedure, the patient's blood pressure, pulse, and                            oxygen saturations were monitored continuously. The                            Endoscope was introduced through the mouth, and                            advanced to the third part  of duodenum. The upper                            GI endoscopy was accomplished without difficulty.                            The patient tolerated the procedure well. Scope In: Scope Out: Findings:                 The esophagus was normal.                           Three erosions with no bleeding and no stigmata of                            recent bleeding were found in the gastric antrum.                            Biopsies were taken from the antrum, body, and                            fundus with a cold forceps for histology. Estimated                            blood loss was minimal.                           Altered post-surgical gastric anatomy from gastric                            sleeve. The residual stomach was otherwise normal.                           The examined duodenum was normal. Biopsies were                            taken with a cold forceps for histology. Estimated  blood loss was minimal. Complications:            No immediate complications. Estimated Blood Loss:     Estimated blood loss was minimal. Impression:               - Normal esophagus.                           - Gastric erosions with no bleeding and no stigmata                            of recent bleeding. Biopsied.                           - Gastric bypass with a medium-sized pouch.                           - Normal examined duodenum. Biopsied. Recommendation:           - Patient has a contact number available for                            emergencies. The signs and symptoms of potential                            delayed complications were discussed with the                            patient. Return to normal activities tomorrow.                            Written discharge instructions were provided to the                            patient.                           - Resume previous diet.                           - Continue present medications.                            - Start pantoprazole 40 mg daily.                           - Await pathology results.                           - No aspirin, ibuprofen, naproxen, or other                            non-steroidal anti-inflammatory drugs. Thornton Park MD, MD 12/23/2022 9:17:23 AM This report has been signed electronically.

## 2022-12-23 NOTE — Telephone Encounter (Signed)
-----   Message from Thornton Park, MD sent at 12/23/2022  9:26 AM EST ----- Please arrange office follow-up with me in 4-8 weeks.  Thanks.  KLB

## 2022-12-23 NOTE — Patient Instructions (Signed)
Await pathology results.  A prescription for Pantoprazole has been sent to your pharmacy.  Handouts on polyps, diverticulosis, and gastritis provided.  YOU HAD AN ENDOSCOPIC PROCEDURE TODAY AT [facility]:   Refer to the procedure report that was given to you for any specific questions about what was found during the examination.  If the procedure report does not answer your questions, please call your gastroenterologist to clarify.  If you requested that your care partner not be given the details of your procedure findings, then the procedure report has been included in a sealed envelope for you to review at your convenience later.  YOU SHOULD EXPECT: Some feelings of bloating in the abdomen. Passage of more gas than usual.  Walking can help get rid of the air that was put into your GI tract during the procedure and reduce the bloating. If you had a lower endoscopy (such as a colonoscopy or flexible sigmoidoscopy) you may notice spotting of blood in your stool or on the toilet paper. If you underwent a bowel prep for your procedure, you may not have a normal bowel movement for a few days.  Please Note:  You might notice some irritation and congestion in your nose or some drainage.  This is from the oxygen used during your procedure.  There is no need for concern and it should clear up in a day or so.  SYMPTOMS TO REPORT IMMEDIATELY:  Following lower endoscopy (colonoscopy or flexible sigmoidoscopy):  Excessive amounts of blood in the stool  Significant tenderness or worsening of abdominal pains  Swelling of the abdomen that is new, acute  Fever of 100F or higher  Following upper endoscopy (EGD)  Vomiting of blood or coffee ground material  New chest pain or pain under the shoulder blades  Painful or persistently difficult swallowing  New shortness of breath  Fever of 100F or higher  Black, tarry-looking stools  For urgent or emergent issues, a gastroenterologist can be  reached at any hour by calling [phone]. Do not use MyChart messaging for urgent concerns.    DIET:  We do recommend a small meal at first, but then you may proceed to your regular diet.  Drink plenty of fluids but you should avoid alcoholic beverages for 24 hours.  ACTIVITY:  You should plan to take it easy for the rest of today and you should NOT DRIVE or use heavy machinery until tomorrow (because of the sedation medicines used during the test).    FOLLOW UP: Our staff will call the number listed on your records the next business day following your procedure.  We will call around 7:15- 8:00 am to check on you and address any questions or concerns that you may have regarding the information given to you following your procedure. If we do not reach you, we will leave a message.     If any biopsies were taken you will be contacted by phone or by letter within the next 1-3 weeks.  Please call us at [phone] if you have not heard about the biopsies in 3 weeks.    SIGNATURES/CONFIDENTIALITY: You and/or your care partner have signed paperwork which will be entered into your electronic medical record.  These signatures attest to the fact that that the information above on your After Visit Summary has been reviewed and is understood.  Full responsibility of the confidentiality of this discharge information lies with you and/or your care-partner.

## 2022-12-23 NOTE — Op Note (Signed)
[place] Patient Name: [name] [name] Procedure Date: 12/23/2022 8:28 AM MRN: [id number] Endoscopist: Thornton Park MD, MD, [id number] Age: 51 Referring MD:  Date of Birth: January 26, 1971 Gender: Female Account #: [id number] Procedure:                Colonoscopy Indications:              Screening for colorectal malignant neoplasm, This                            is the patient's first colonoscopy Medicines:                Monitored Anesthesia Care Procedure:                Pre-Anesthesia Assessment:                           - Prior to the procedure, a History and Physical                            was performed, and patient medications and                            allergies were reviewed. The patient's tolerance of                            previous anesthesia was also reviewed. The risks                            and benefits of the procedure and the sedation                            options and risks were discussed with the patient.                            All questions were answered, and informed consent                            was obtained. Prior Anticoagulants: The patient has                            taken no anticoagulant or antiplatelet agents. ASA                            Grade Assessment: II - A patient with mild systemic                            disease. After reviewing the risks and benefits,                            the patient was deemed in satisfactory condition to                            undergo the procedure.  After obtaining informed consent, the colonoscope                            was passed under direct vision. Throughout the                            procedure, the patient's blood pressure, pulse, and                            oxygen saturations were monitored continuously. The                            CF [id number] #[id number] was introduced through the anus                            and advanced to  the 3 cm into the ileum. A second                            forward view of the right colon was performed. The                            colonoscopy was performed without difficulty. The                            patient tolerated the procedure well. The quality                            of the bowel preparation was good. The terminal                            ileum, ileocecal valve, appendiceal orifice, and                            rectum were photographed. Scope In: 8:50:40 AM Scope Out: 9:08:49 AM Scope Withdrawal Time: 0 hours 16 minutes 0 seconds  Total Procedure Duration: 0 hours 18 minutes 9 seconds  Findings:                 The perianal and digital rectal examinations were                            normal.                           Multiple medium-mouthed and small-mouthed                            diverticula were found in the sigmoid colon and                            descending colon.                           Two sessile polyps were found in the transverse  colon. The polyps were 2 to 3 mm in size. These                            polyps were removed with a cold snare. Resection                            and retrieval were complete. Estimated blood loss                            was minimal.                           A 3 mm polyp was found in the cecum. The polyp was                            sessile. The polyp was removed with a cold snare.                            Resection and retrieval were complete. Estimated                            blood loss was minimal.                           The colon (entire examined portion) was redundant                            but otherwise normal. Complications:            No immediate complications. Estimated Blood Loss:     Estimated blood loss was minimal. Impression:               - Diverticulosis in the sigmoid colon and in the                            descending colon.                            - Two 2 to 3 mm polyps in the transverse colon,                            removed with a cold snare. Resected and retrieved.                           - One 3 mm polyp in the cecum, removed with a cold                            snare. Resected and retrieved.                           - Redundant colon. Recommendation:           - Patient has a contact number available for  emergencies. The signs and symptoms of potential                            delayed complications were discussed with the                            patient. Return to normal activities tomorrow.                            Written discharge instructions were provided to the                            patient.                           - High fiber diet. Consider using a daily dose of                            Metamucil or Benefiber.                           - Continue present medications.                           - Await pathology results.                           - Repeat colonoscopy in 3 years for surveillance if                            all 3 polyps are adenomas.                           - Emerging evidence supports eating a diet of                            fruits, vegetables, grains, calcium, and yogurt                            while reducing red meat and alcohol may reduce the                            risk of colon cancer.                           - Thank you for allowing me to be involved in your                            colon cancer prevention. Thornton Park MD, MD 12/23/2022 9:22:01 AM This report has been signed electronically.

## 2022-12-23 NOTE — Progress Notes (Signed)
Vital signs checked by:DT  The medical and surgical history was reviewed and verified with the patient.  
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
## 2022-12-23 NOTE — Telephone Encounter (Signed)
OV scheduled for 02/08/23 at 9:10 am with Dr. Tarri Glenn. Pt notified via mychart.

## 2022-12-23 NOTE — Progress Notes (Signed)
Called to room to assist during endoscopic procedure.  Patient ID and intended procedure confirmed with present staff. Received instructions for my participation in the procedure from the performing physician.  

## 2022-12-24 ENCOUNTER — Telehealth: Payer: Self-pay | Admitting: *Deleted

## 2022-12-24 NOTE — Telephone Encounter (Signed)
  Follow up Call-     12/23/2022    7:41 AM  Call back number  Post procedure Call Back phone  # [phone]  Permission to leave phone message Yes     Patient questions:  Do you have a fever, pain , or abdominal swelling? No. Pain Score  0 *  Have you tolerated food without any problems? Yes.    Have you been able to return to your normal activities? Yes.    Do you have any questions about your discharge instructions: Diet   No. Medications  No. Follow up visit  No.  Do you have questions or concerns about your Care? No.  Actions: * If pain score is 4 or above: No action needed, pain <4.

## 2022-12-30 ENCOUNTER — Other Ambulatory Visit: Payer: Self-pay | Admitting: Hematology and Oncology

## 2023-01-09 ENCOUNTER — Ambulatory Visit (HOSPITAL_COMMUNITY)
Admission: EM | Admit: 2023-01-09 | Discharge: 2023-01-10 | Disposition: A | Discharge status: Home / Self Care | Payer: Medicare Other | Attending: Family | Admitting: Family

## 2023-01-09 DIAGNOSIS — Z1152 Encounter for screening for COVID-19: Secondary | ICD-10-CM | POA: Insufficient documentation

## 2023-01-09 DIAGNOSIS — F4321 Adjustment disorder with depressed mood: Secondary | ICD-10-CM

## 2023-01-09 DIAGNOSIS — Z79899 Other long term (current) drug therapy: Secondary | ICD-10-CM | POA: Diagnosis not present

## 2023-01-09 DIAGNOSIS — Z9151 Personal history of suicidal behavior: Secondary | ICD-10-CM | POA: Insufficient documentation

## 2023-01-09 DIAGNOSIS — F251 Schizoaffective disorder, depressive type: Secondary | ICD-10-CM | POA: Diagnosis not present

## 2023-01-09 DIAGNOSIS — F4320 Adjustment disorder, unspecified: Secondary | ICD-10-CM | POA: Insufficient documentation

## 2023-01-09 DIAGNOSIS — Z3202 Encounter for pregnancy test, result negative: Secondary | ICD-10-CM

## 2023-01-09 DIAGNOSIS — F411 Generalized anxiety disorder: Secondary | ICD-10-CM | POA: Diagnosis not present

## 2023-01-09 DIAGNOSIS — R45851 Suicidal ideations: Secondary | ICD-10-CM | POA: Diagnosis present

## 2023-01-09 DIAGNOSIS — F322 Major depressive disorder, single episode, severe without psychotic features: Secondary | ICD-10-CM

## 2023-01-09 LAB — RESP PANEL BY RT-PCR (RSV, FLU A&B, COVID)  RVPGX2
Influenza A by PCR: NEGATIVE
Influenza B by PCR: NEGATIVE
Resp Syncytial Virus by PCR: NEGATIVE
SARS Coronavirus 2 by RT PCR: NEGATIVE

## 2023-01-09 LAB — COMPREHENSIVE METABOLIC PANEL
ALT: 10 U/L (ref 0–44)
AST: 15 U/L (ref 15–41)
Albumin: 4.2 g/dL (ref 3.5–5.0)
Alkaline Phosphatase: 66 U/L (ref 38–126)
Anion gap: 9 (ref 5–15)
BUN: 8 mg/dL (ref 6–20)
CO2: 26 mmol/L (ref 22–32)
Calcium: 9 mg/dL (ref 8.9–10.3)
Chloride: 104 mmol/L (ref 98–111)
Creatinine, Ser: 0.87 mg/dL (ref 0.44–1.00)
GFR, Estimated: >60 mL/min (ref >60)
Glucose, Bld: 122 mg/dL — ABNORMAL HIGH (ref 70–99)
Potassium: 3.5 mmol/L (ref 3.5–5.1)
Sodium: 139 mmol/L (ref 135–145)
Total Bilirubin: 0.4 mg/dL (ref 0.3–1.2)
Total Protein: 6.8 g/dL (ref 6.5–8.1)

## 2023-01-09 LAB — POCT URINE DRUG SCREEN - MANUAL ENTRY (I-SCREEN)
POC Amphetamine UR: NOT DETECTED
POC Buprenorphine (BUP): NOT DETECTED
POC Cocaine UR: NOT DETECTED
POC Marijuana UR: NOT DETECTED
POC Methadone UR: NOT DETECTED
POC Methamphetamine UR: NOT DETECTED
POC Morphine: NOT DETECTED
POC Oxazepam (BZO): NOT DETECTED
POC Oxycodone UR: NOT DETECTED
POC Secobarbital (BAR): NOT DETECTED

## 2023-01-09 LAB — LIPID PANEL
Cholesterol: 208 mg/dL — ABNORMAL HIGH (ref 0–200)
HDL: 68 mg/dL (ref >40)
LDL Cholesterol: 127 mg/dL — ABNORMAL HIGH (ref 0–99)
Total CHOL/HDL Ratio: 3.1 RATIO
Triglycerides: 65 mg/dL (ref <150)
VLDL: 13 mg/dL (ref 0–40)

## 2023-01-09 LAB — CBC WITH DIFFERENTIAL/PLATELET
Abs Immature Granulocytes: 0.01 10*3/uL (ref 0.00–0.07)
Basophils Absolute: 0.1 10*3/uL (ref 0.0–0.1)
Basophils Relative: 1 %
Eosinophils Absolute: 0.1 10*3/uL (ref 0.0–0.5)
Eosinophils Relative: 2 %
HCT: 41.3 % (ref 36.0–46.0)
Hemoglobin: 14.2 g/dL (ref 12.0–15.0)
Immature Granulocytes: 0 %
Lymphocytes Relative: 22 %
Lymphs Abs: 1.1 10*3/uL (ref 0.7–4.0)
MCH: 31.2 pg (ref 26.0–34.0)
MCHC: 34.4 g/dL (ref 30.0–36.0)
MCV: 90.8 fL (ref 80.0–100.0)
Monocytes Absolute: 0.3 10*3/uL (ref 0.1–1.0)
Monocytes Relative: 6 %
Neutro Abs: 3.4 10*3/uL (ref 1.7–7.7)
Neutrophils Relative %: 69 %
Platelets: 274 10*3/uL (ref 150–400)
RBC: 4.55 MIL/uL (ref 3.87–5.11)
RDW: 11.8 % (ref 11.5–15.5)
WBC: 4.9 10*3/uL (ref 4.0–10.5)
nRBC: 0 % (ref 0.0–0.2)

## 2023-01-09 LAB — TSH: TSH: 0.612 u[IU]/mL (ref 0.350–4.500)

## 2023-01-09 LAB — HEMOGLOBIN A1C
Hgb A1c MFr Bld: 4.6 % — ABNORMAL LOW (ref 4.8–5.6)
Mean Plasma Glucose: 85.32 mg/dL

## 2023-01-09 LAB — POCT PREGNANCY, URINE: Preg Test, Ur: NEGATIVE

## 2023-01-09 LAB — POC URINE PREG, ED: Preg Test, Ur: NEGATIVE

## 2023-01-09 LAB — POC SARS CORONAVIRUS 2 AG: SARSCOV2ONAVIRUS 2 AG: NEGATIVE

## 2023-01-09 MED ORDER — MAGNESIUM HYDROXIDE 400 MG/5ML PO SUSP: 30.0000 mL | Freq: Every day | ORAL | Status: DC | PRN

## 2023-01-09 MED ORDER — TRAZODONE HCL 50 MG PO TABS
50.0000 mg | ORAL_TABLET | Freq: Every evening | ORAL | Status: DC | PRN
  Administered 2023-01-09: 50 mg via ORAL
  Filled 2023-01-09: qty 1

## 2023-01-09 MED ORDER — LORAZEPAM 1 MG PO TABS
1.0000 mg | ORAL_TABLET | Freq: Once | ORAL | Status: AC
  Administered 2023-01-09: 1 mg via ORAL
  Filled 2023-01-09: qty 1

## 2023-01-09 MED ORDER — ALUM & MAG HYDROXIDE-SIMETH 200-200-20 MG/5ML PO SUSP
30.0000 mL | ORAL | Status: DC | PRN
  Administered 2023-01-10: 30 mL via ORAL
  Filled 2023-01-09: qty 30

## 2023-01-09 MED ORDER — ACETAMINOPHEN 325 MG PO TABS
650.0000 mg | ORAL_TABLET | Freq: Four times a day (QID) | ORAL | Status: DC | PRN
  Administered 2023-01-10: 650 mg via ORAL
  Filled 2023-01-09: qty 2

## 2023-01-09 MED ORDER — HYDROXYZINE HCL 25 MG PO TABS
25.0000 mg | ORAL_TABLET | Freq: Three times a day (TID) | ORAL | Status: DC | PRN
  Administered 2023-01-09: 25 mg via ORAL
  Filled 2023-01-09: qty 1
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
## 2023-01-09 NOTE — ED Notes (Signed)
STAT lab courier called to transport labs to [facility] lab 

## 2023-01-09 NOTE — ED Notes (Signed)
Patient observed/assessed in bed/chair resting quietly appearing in no distress and verbalizing no complaints at this time. Will continue to monitor.

## 2023-01-09 NOTE — Progress Notes (Signed)
[name] [name] 52 year-old female who presents voluntarily to [facility] ([facility]) due to having passive SI. She has had past attempts but no current intent or plan. Patient denies HI or AVH.  Patient reports previous hospitalization for her mental health in 2020 at [facility].   She has diagnosis of Depression, anxiety and schizoaffective disorder. The patient is prescribed BuSpar, Risperdal, Trazodone. Patient denies SA.  Patient was alert and oriented. She was casually dressed. Speech was soft and was difficult to understand at times.      01/09/23 1629  [place] (Walk-ins at [facility] only)  How Did You Hear About [place]? Self  What Is the Reason for Your Visit/Call Today? Patient is a 52 year old female who presents voluntarily to [facility] ([facility]) due to having passive SI. She has had past attempts but no current intent or plan. Patient denies HI or AVH.  How Long Has This Been Causing You Problems? 1 wk - 1 month  Have You Recently Had Any Thoughts About Hurting Yourself? Yes  How long ago did you have thoughts about hurting yourself? about 2 wwks ago  Are You Planning to [place] At This time? No  Have you Recently Had Thoughts About [place]? No  Are You Planning To Harm Someone At This Time? No  Are you currently experiencing any auditory, visual or other hallucinations? Yes  Please explain the hallucinations you are currently experiencing: I see things when I leave my house that I realize are not really there.  Have You Used Any Alcohol or Drugs in the Past 24 Hours? No  Do you have any current medical co-morbidities that require immediate attention? No  Clinician description of patient physical appearance/behavior: Patient was alert and oriented.  She was casually dressed.  Speech was soft.  What Do You Feel Would Help You the Most Today? Treatment for Depression or other mood problem  If access to [facility] was not  available, would you have sought care in the Emergency Department? No  Determination of Need Urgent (48 hours)  Options For Referral Inpatient Hospitalization;Outpatient Therapy;Medication Management;BH Urgent Care
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
## 2023-01-09 NOTE — ED Notes (Signed)
Pt  is requesting help with self harm ideations. Denies HI/AVH. Calm, cooperative throughout interview process. Skin assessment completed. Oriented to unit. Meal and drink offered. At [place], pt continue deny HI/AVH,but endorses self harm ideation. Pt verbally contract for safety. Will monitor for safety. Patient belongings are in locker 23.[name] [name] has  a bra,blue purse,gloves, lotion,sweater,and a gray ring with purple stone.

## 2023-01-09 NOTE — ED Notes (Signed)
Patient is watching tv. Alert and oriented. Will continue to monitor for safety.

## 2023-01-09 NOTE — ED Notes (Signed)
Patient observed/assessed in bed/chair asleep and easily aroused with light verbal stimulation. Patient alert and oriented x 3. Affect is flat. Patient denies pain and anxiety. He denies A/V/H. He denies having any thoughts/plan of self harm and harm towards others. Fluid and snack offered. Patient states that appetite has been good throughout the day. Verbalizes no further complaints at this time. Will continue to monitor and support.

## 2023-01-09 NOTE — Discharge Instructions (Addendum)
Dear [name] [name],  Most effective treatment for your mental health disease involves BOTH a psychiatrist AND a therapist Psychiatrist to manage medications Therapist to help identify personal goals, barriers from those goals, and plan to achieve those goals by understanding emotions Please make regular appointments with an outpatient psychiatrist and other doctors once you leave the hospital (if any, otherwise, please see below for resources to make an appointment).  For therapy outside the hospital, please ask for these specific types of therapy: DBT ________________________________________________________  SAFETY CRISIS  Dial 988 for [place]    Text [phone] for Crisis Text Line:     [facility]:  [address] FIRST FLOOR.  [place], [place] [address].  [phone]  Mobile Crisis Response Teams Listed by counties in vicinity of [place]. [phone] [place] [phone] [place] [phone] [facility] [phone] [place] [phone] [place]. [phone] [name].  [place] [phone] ________________________________________________________  To see which pharmacy near you is the CHEAPEST for certain medications, please use GoodRx. It is free website and has a free phone app.    Also consider looking at [facility] $4.00 or [facility]'s $7.00 prescription list. Both are free to view if googled "[facility] $4 prescription" and "public's $7 prescription". These are set prices, no insurance required. [facility]'s low cost medications: $4-$15 for 30days  prescriptions or $10-$38 for 90days prescriptions  ________________________________________________________  Difficulties with sleep?   Can also use this free app for insomnia called CBT-I. Let your doctors and therapists know so they can help with extra tips and tricks or for guidance and accountability. NO ADDS on the app.     ________________________________________________________  Non-Emergent / Urgent  [facility] [address], [place], [place] [address] [phone] OUTPATIENT Walk-in information: Please note, all walk-ins are first come & first serve, with limited number of availability.  Please note that to be eligible for services you must bring: ID or a piece of mail with your name [facility] address  Therapist for therapy:  Monday & Wednesdays: Please ARRIVE at 7:15 AM for registration Will START at 8:00 AM Every 1st & 2nd Friday of the month: Please ARRIVE at 10:15 AM for registration Will START at 1 PM - 5 PM  Psychiatrist for medication management: Monday - Friday:  Please ARRIVE at 7:15 AM for registration Will START at 8:00 AM  Regretfully, due to limited availability, please be aware that you may not been seen on the same day as walk-in. Please consider making an appoint or try again. Thank you for your patience and understanding.   [facility] [address] [place], [place], [address] [phone] office [phone] toll free number  To make an appointment for grief and loss counseling, call [phone].  [facility] offers support groups and workshops for adults and children as well as one-on-one counseling for adolescents and children.  Medication Management and Therapy for No Insurance/IPRS Based on observation and your report, outpatient services with psychiatry and therapy have been recommended.  It is imperative to your mental health that seek out services  7-10  day from your day of discharge to prevent another crisis. A list of referrals has been provided below based on your needs and recommendations.  You are not limited to the list provided.  In case of an urgent crisis, you may contact the Mobile Crisis Unit with [facility] at [phone].   You have the option to call 211 for assistance in finding additional mental health agencies if these do not meet your needs.    For your behavioral health needs you are advised to follow up with [facility] at your earliest opportunity:  [facility] [address], [place], [place] [address] [phone] OUTPATIENT Walk-in information: Please note, all walk-ins are first come & first serve, with limited number of availability.  Please note that to be eligible for services you must bring an ID or a piece of mail with your name and a [facility] address.  Therapist for therapy:  Monday & Wednesdays: Please ARRIVE at 7:15 AM for registration Will START at 8:00 AM Every 1st & 2nd Friday of the month: Please ARRIVE at 10:15 AM for registration Will START at 1 PM - 5 PM  Psychiatrist for medication management: Monday - Friday:  Please ARRIVE at 7:15 AM for registration Will START at 8:00 AM       [place].    Walk-in are seen on a first come, first served basis, so try to arrive as early as possible for the best chance of being seen the same day. Otherwise please talk to front desk for an appointment.  Regretfully, due to limited availability, please be aware that you may not been seen on the same day as walk-in. Please consider making an appoint or try again. Thank you for your patience and understanding.    Other locations to try:        [place], [place], [place] [address]      ([phone] [place]      [place], [place], [address]      [phone] phone      [phone] fax        [place] at [facility]      [address] [place] [address]      [place], [place], [address]      [phone] phone to set up initial appointment       [place]     [address]     [place], [place], [address]     [phone] phone     [phone] fax

## 2023-01-10 DIAGNOSIS — F251 Schizoaffective disorder, depressive type: Secondary | ICD-10-CM | POA: Diagnosis not present

## 2023-01-10 DIAGNOSIS — F4320 Adjustment disorder, unspecified: Secondary | ICD-10-CM

## 2023-01-10 DIAGNOSIS — Z1152 Encounter for screening for COVID-19: Secondary | ICD-10-CM | POA: Diagnosis not present

## 2023-01-10 DIAGNOSIS — F4321 Adjustment disorder with depressed mood: Secondary | ICD-10-CM | POA: Diagnosis not present

## 2023-01-10 DIAGNOSIS — F322 Major depressive disorder, single episode, severe without psychotic features: Secondary | ICD-10-CM | POA: Diagnosis not present

## 2023-01-10 DIAGNOSIS — Z79899 Other long term (current) drug therapy: Secondary | ICD-10-CM | POA: Diagnosis not present

## 2023-01-10 DIAGNOSIS — Z9151 Personal history of suicidal behavior: Secondary | ICD-10-CM | POA: Diagnosis not present

## 2023-01-10 MED ORDER — RISPERIDONE 1 MG PO TABS: 1.0000 mg | ORAL_TABLET | Freq: Every morning | ORAL | Status: DC

## 2023-01-10 MED ORDER — RISPERIDONE 0.5 MG PO TABS: 1.5000 mg | ORAL_TABLET | Freq: Every day | ORAL | Status: DC

## 2023-01-10 MED ORDER — RISPERIDONE 1 MG PO TBDP
ORAL_TABLET | ORAL | Status: AC
  Administered 2023-01-10: 1 mg
  Filled 2023-01-10: qty 1

## 2023-01-10 NOTE — ED Notes (Signed)
Pt resting quietly, breathing is even and unlabored.  Pt denies SI, HI, and AVH.  Pt reports indigestion and headache this morning, prn medications provided.  Pt reports she feels better today then when she first came in.  Reports one of he big stressors is the recent loss of her dog. Will continue to monitor for safety.
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
## 2023-01-10 NOTE — ED Notes (Signed)
Patient observed/assessed in bed/chair resting quietly appearing in no distress and verbalizing no complaints at this time. Will continue to monitor.

## 2023-01-17 ENCOUNTER — Ambulatory Visit (HOSPITAL_COMMUNITY): Payer: Medicare Other | Admitting: Clinical

## 2023-01-17 ENCOUNTER — Encounter: Payer: Self-pay | Admitting: Physician Assistant

## 2023-01-17 ENCOUNTER — Ambulatory Visit: Payer: Medicare Other | Admitting: Behavioral Health

## 2023-01-17 ENCOUNTER — Ambulatory Visit: Payer: Medicare Other | Admitting: Physician Assistant

## 2023-01-17 DIAGNOSIS — F4321 Adjustment disorder with depressed mood: Secondary | ICD-10-CM

## 2023-01-17 DIAGNOSIS — F4310 Post-traumatic stress disorder, unspecified: Secondary | ICD-10-CM

## 2023-01-17 DIAGNOSIS — F250 Schizoaffective disorder, bipolar type: Secondary | ICD-10-CM | POA: Diagnosis not present

## 2023-01-17 DIAGNOSIS — F419 Anxiety disorder, unspecified: Secondary | ICD-10-CM

## 2023-01-17 DIAGNOSIS — F99 Mental disorder, not otherwise specified: Secondary | ICD-10-CM

## 2023-01-17 DIAGNOSIS — F5105 Insomnia due to other mental disorder: Secondary | ICD-10-CM
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
## 2023-01-21 ENCOUNTER — Telehealth: Payer: Self-pay

## 2023-01-24 ENCOUNTER — Emergency Department (HOSPITAL_COMMUNITY)
Admission: EM | Admit: 2023-01-24 | Discharge: 2023-01-25 | Discharge status: Against Medical Advice | Payer: Medicare Other | Attending: Emergency Medicine | Admitting: Emergency Medicine

## 2023-01-24 ENCOUNTER — Encounter (HOSPITAL_COMMUNITY): Payer: Self-pay | Admitting: *Deleted

## 2023-01-24 ENCOUNTER — Emergency Department (HOSPITAL_COMMUNITY): Payer: Medicare Other

## 2023-01-24 ENCOUNTER — Other Ambulatory Visit: Payer: Self-pay

## 2023-01-24 DIAGNOSIS — R519 Headache, unspecified: Secondary | ICD-10-CM | POA: Diagnosis not present

## 2023-01-24 DIAGNOSIS — R42 Dizziness and giddiness: Secondary | ICD-10-CM | POA: Diagnosis not present

## 2023-01-24 DIAGNOSIS — R030 Elevated blood-pressure reading, without diagnosis of hypertension: Secondary | ICD-10-CM | POA: Insufficient documentation

## 2023-01-24 DIAGNOSIS — Z5321 Procedure and treatment not carried out due to patient leaving prior to being seen by health care provider: Secondary | ICD-10-CM | POA: Insufficient documentation

## 2023-01-24 DIAGNOSIS — I10 Essential (primary) hypertension: Secondary | ICD-10-CM | POA: Diagnosis not present

## 2023-01-24 LAB — COMPREHENSIVE METABOLIC PANEL
ALT: 11 U/L (ref 0–44)
AST: 13 U/L — ABNORMAL LOW (ref 15–41)
Albumin: 4.3 g/dL (ref 3.5–5.0)
Alkaline Phosphatase: 77 U/L (ref 38–126)
Anion gap: 10 (ref 5–15)
BUN: 9 mg/dL (ref 6–20)
CO2: 25 mmol/L (ref 22–32)
Calcium: 9.2 mg/dL (ref 8.9–10.3)
Chloride: 105 mmol/L (ref 98–111)
Creatinine, Ser: 0.77 mg/dL (ref 0.44–1.00)
GFR, Estimated: >60 mL/min (ref >60)
Glucose, Bld: 102 mg/dL — ABNORMAL HIGH (ref 70–99)
Potassium: 3.6 mmol/L (ref 3.5–5.1)
Sodium: 140 mmol/L (ref 135–145)
Total Bilirubin: 0.4 mg/dL (ref 0.3–1.2)
Total Protein: 6.5 g/dL (ref 6.5–8.1)

## 2023-01-24 LAB — I-STAT BETA HCG BLOOD, ED (MC, WL, AP ONLY): I-stat hCG, quantitative: 19.4 m[IU]/mL — ABNORMAL HIGH (ref <5)

## 2023-01-24 LAB — CBC WITH DIFFERENTIAL/PLATELET
Abs Immature Granulocytes: 0.01 10*3/uL (ref 0.00–0.07)
Basophils Absolute: 0 10*3/uL (ref 0.0–0.1)
Basophils Relative: 1 %
Eosinophils Absolute: 0.1 10*3/uL (ref 0.0–0.5)
Eosinophils Relative: 2 %
HCT: 42.3 % (ref 36.0–46.0)
Hemoglobin: 14.4 g/dL (ref 12.0–15.0)
Immature Granulocytes: 0 %
Lymphocytes Relative: 26 %
Lymphs Abs: 1.1 10*3/uL (ref 0.7–4.0)
MCH: 31.1 pg (ref 26.0–34.0)
MCHC: 34 g/dL (ref 30.0–36.0)
MCV: 91.4 fL (ref 80.0–100.0)
Monocytes Absolute: 0.3 10*3/uL (ref 0.1–1.0)
Monocytes Relative: 7 %
Neutro Abs: 2.7 10*3/uL (ref 1.7–7.7)
Neutrophils Relative %: 64 %
Platelets: 270 10*3/uL (ref 150–400)
RBC: 4.63 MIL/uL (ref 3.87–5.11)
RDW: 11.6 % (ref 11.5–15.5)
WBC: 4.2 10*3/uL (ref 4.0–10.5)
nRBC: 0 % (ref 0.0–0.2)

## 2023-01-24 LAB — URINALYSIS, ROUTINE W REFLEX MICROSCOPIC
Bilirubin Urine: NEGATIVE
Glucose, UA: NEGATIVE mg/dL
Hgb urine dipstick: NEGATIVE
Ketones, ur: NEGATIVE mg/dL
Leukocytes,Ua: NEGATIVE
Nitrite: NEGATIVE
Protein, ur: NEGATIVE mg/dL
Specific Gravity, Urine: 1.004 — ABNORMAL LOW (ref 1.005–1.030)
pH: 8 (ref 5.0–8.0)

## 2023-01-24 NOTE — ED Provider Triage Note (Cosign Needed Addendum)
Emergency Medicine Provider Triage Evaluation Note  [name] [name] , a 52 y.o. female  was evaluated in triage.  Pt complains of high blood pressure and headache.  States she was at her dentist appointment and was told her blood pressure was high in the 160s/90s.  Recommended to come to the ED for further evaluation per patient.    Also reporting headache with new features.  Headache usually in the temporal region, however this headache is in the main frontal part of her head.  Has tried OTC remedies and fluids without relief.  Denies chest pain, shortness of breath, dizziness, vision loss, neck stiffness, recent URI symptoms, fevers, chills.  Hx of borderline personality disorder, schizoaffective disorder, PTSD, renal insufficiency, GERD, anemia, anxiety, depression, illicit drug use.  Review of Systems  Positive:  Negative: See above  Physical Exam  BP (!) 163/103   Pulse (!) 124   Temp 98.2 F (36.8 C)   Resp 18   Ht '5\' 3"'$  (1.6 m)   Wt 70.3 kg   SpO2 98%   BMI 27.46 kg/m  Gen:   Awake, no distress   Resp:  Normal effort  MSK:   Moves extremities without difficulty  Other:  Sitting comfortably.  Mildly anxious appearing.  EOMI to provider's face.  No temporal tenderness.  PERRLA.  Chest nonTTP.  Medical Decision Making  Medically screening exam initiated at 3:46 PM.  Appropriate orders placed.  [name] [name] was informed that the remainder of the evaluation will be completed by another provider, this initial triage assessment does not replace that evaluation, and the importance of remaining in the ED until their evaluation is complete.      [name], PA-C 67/54/49 [id number]

## 2023-01-24 NOTE — ED Notes (Signed)
Pt's family member is stating that she has missed her anxiety meds and is coming up on missing her schizophrenia medication.

## 2023-01-24 NOTE — ED Triage Notes (Addendum)
Patient states she was seen at the dentist for a cleaning and her bp was elevated.  Patient 167/102 and 167/120.  Patient reports she has had headache, lightheadedness, and she is voiding more than usual.  Patient takes Lisbeth Ply for overactive bladder for 2 years.  Patient denies any chest pain, denies shortness of breath.  She does report dizziness.  Patient is alert and oriented.  Per the patient all sx started today.  Patient has frontal headache that has been present since yesterday.

## 2023-01-25 DIAGNOSIS — E7800 Pure hypercholesterolemia, unspecified: Secondary | ICD-10-CM | POA: Diagnosis not present

## 2023-01-25 DIAGNOSIS — E119 Type 2 diabetes mellitus without complications: Secondary | ICD-10-CM | POA: Diagnosis not present

## 2023-01-25 DIAGNOSIS — R9431 Abnormal electrocardiogram [ECG] [EKG]: Secondary | ICD-10-CM | POA: Diagnosis not present

## 2023-01-25 DIAGNOSIS — R000 Tachycardia, unspecified: Secondary | ICD-10-CM | POA: Diagnosis not present

## 2023-01-25 DIAGNOSIS — I10 Essential (primary) hypertension: Secondary | ICD-10-CM | POA: Diagnosis not present

## 2023-01-25 NOTE — ED Notes (Signed)
Patient stated that she was leaving, because she been waiting to long.

## 2023-01-26 ENCOUNTER — Telehealth: Payer: Self-pay

## 2023-01-26 NOTE — Telephone Encounter (Signed)
     Patient  visit on 1/30  at [facility]   Patient was never seen in the ED at [facility] after 14 hours so had to go to [place]. Patient declined call after  Have you been able to follow up with your primary care physician? No   The patient was or was not able to obtain any needed medicine or equipment. No   Are there diet recommendations that you are having difficulty following? No   Patient expresses understanding of discharge instructions and education provided has no other needs at this time.  No      [place] [id number], [place], [place] [address] Phone: [phone] Email: [email or web address]

## 2023-01-27 ENCOUNTER — Encounter: Payer: Self-pay | Admitting: Gastroenterology

## 2023-01-29 DIAGNOSIS — I10 Essential (primary) hypertension: Secondary | ICD-10-CM | POA: Diagnosis not present

## 2023-01-29 DIAGNOSIS — R002 Palpitations: Secondary | ICD-10-CM | POA: Diagnosis not present

## 2023-01-31 NOTE — Telephone Encounter (Signed)
Letter sent.

## 2023-02-01 ENCOUNTER — Telehealth: Payer: Self-pay

## 2023-02-01 DIAGNOSIS — G4700 Insomnia, unspecified: Secondary | ICD-10-CM | POA: Diagnosis not present

## 2023-02-01 NOTE — Telephone Encounter (Signed)
     Patient  visit on 01/25/2023  at [facility] was for hypertension.  Have you been able to follow up with your primary care physician? Yes  The patient was or was not able to obtain any needed medicine or equipment. Patient was able to obtain medication.  Are there diet recommendations that you are having difficulty following? No  Patient expresses understanding of discharge instructions and education provided has no other needs at this time. Yes   [place] [facility] Guide   [email or web address]  ?? [phone]   Website: [email or web address]  [email or web address]

## 2023-02-08 ENCOUNTER — Other Ambulatory Visit: Payer: Self-pay | Admitting: Physician Assistant

## 2023-02-08 ENCOUNTER — Ambulatory Visit: Payer: Medicare Other | Admitting: Gastroenterology

## 2023-02-08 ENCOUNTER — Telehealth: Payer: Self-pay | Admitting: Gastroenterology

## 2023-02-08 ENCOUNTER — Encounter: Payer: Self-pay | Admitting: Gastroenterology

## 2023-02-08 ENCOUNTER — Other Ambulatory Visit: Payer: Self-pay | Admitting: Hematology and Oncology

## 2023-02-08 VITALS — BP 126/70 | HR 67 | Ht 63.0 in | Wt 161.0 lb

## 2023-02-08 DIAGNOSIS — K580 Irritable bowel syndrome with diarrhea: Secondary | ICD-10-CM

## 2023-02-08 DIAGNOSIS — R109 Unspecified abdominal pain: Secondary | ICD-10-CM | POA: Diagnosis not present

## 2023-02-08 DIAGNOSIS — D509 Iron deficiency anemia, unspecified: Secondary | ICD-10-CM | POA: Diagnosis not present

## 2023-02-08 DIAGNOSIS — R140 Abdominal distension (gaseous): Secondary | ICD-10-CM

## 2023-02-08 MED ORDER — RIFAXIMIN 550 MG PO TABS: 550.0000 mg | ORAL_TABLET | Freq: Three times a day (TID) | ORAL | 0 refills | Status: DC

## 2023-02-08 NOTE — Telephone Encounter (Signed)
PRIOR AUTHORIZATION  PA initiation date: 02/08/2023  Medication: Xifaxan 550 mg  Insurance Company: [employer] completed electronically through [employer] My Meds: Yes  Will await insurance response re: approval/denial.

## 2023-02-08 NOTE — Telephone Encounter (Signed)
Inbound call from pt states  that prior authorization for rifaximin  is required.Please advise

## 2023-02-08 NOTE — Patient Instructions (Addendum)
It was a pleasure to see you today.  I recommend that you avoid carbonated drinks.  Avoid gas producing foods such as beans, broccoli, cabbage.  64 ounces of water daily.  I recommend taking simethicone as capsules, tablets, or drops after meals and at bedtime.  Please follow the instructions on the package for dosing.  We discussed a trial of 2 weeks of antibiotics to try to reset the gut bacteria.  Given your gastric erosions I recommend that you avoid all nonsteroidal anti-inflammatory medications.  Continue to take your pantoprazole 40 mg every morning.  A single multivitamin may be used as long as it includes the following: Iron 45 mg daily, vitamin B12 500 mcg daily, folate 800 mcg daily, vitamin D 3000 international units daily, vitamin A 5000 international units daily, vitamin K 300 mcg daily, zinc 8 mg daily, copper 1 mg daily, selenium, magnesium and trace minerals that are contained in a multivitamin such as molybdenum, manganese, and chromium. A B complex vitamin of at least 50 mg should be used in addition to a multivitamin.   Given your history of colon polyps he should plan to have another colonoscopy in 3 years.  We would want to consider a colonoscopy earlier than this if you develop any symptoms before that time.  You have been scheduled for an Upper GI Series at [facility]. Your appointment is on Friday 02/18/23  at 10 am. Please arrive 30 minutes prior to your test for registration. Make sure not to eat or drink anything after midnight on the night before your test. If you need to reschedule, please call radiology at [phone]. ________________________________________________________________ An upper GI series uses x rays to help diagnose problems of the upper GI tract, which includes the esophagus, stomach, and duodenum. The duodenum is the first part of the small intestine. An upper GI series is conducted by a radiology technologist or a radiologist--a doctor  who specializes in x-ray imaging--at a hospital or outpatient center. While sitting or standing in front of an x-ray machine, the patient drinks barium liquid, which is often white and has a chalky consistency and taste. The barium liquid coats the lining of the upper GI tract and makes signs of disease show up more clearly on x rays. X-ray video, called fluoroscopy, is used to view the barium liquid moving through the esophagus, stomach, and duodenum. Additional x rays and fluoroscopy are performed while the patient lies on an x-ray table. To fully coat the upper GI tract with barium liquid, the technologist or radiologist may press on the abdomen or ask the patient to change position. Patients hold still in various positions, allowing the technologist or radiologist to take x rays of the upper GI tract at different angles. If a technologist conducts the upper GI series, a radiologist will later examine the images to look for problems.  This test typically takes about 1 hour to complete. __________________________________________________________________
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
## 2023-02-09 ENCOUNTER — Other Ambulatory Visit: Payer: Self-pay

## 2023-02-09 ENCOUNTER — Telehealth: Payer: Self-pay | Admitting: Gastroenterology

## 2023-02-09 MED ORDER — DOXYCYCLINE HYCLATE 100 MG PO CAPS: 100.0000 mg | ORAL_CAPSULE | Freq: Two times a day (BID) | ORAL | 0 refills | Status: DC

## 2023-02-09 NOTE — Telephone Encounter (Signed)
Doxycycline sent to pt's pharmacy and called pt to let her know. Pt verbalized understanding.

## 2023-02-09 NOTE — Telephone Encounter (Signed)
[name] [name] is Dr Tarri Glenn nurse will forward to her

## 2023-02-09 NOTE — Telephone Encounter (Signed)
Inbound call from pt regarding  Xifaxan medication , said her copay is to high and need a generic one or if theres something else instead .Marland KitchenMarland KitchenMarland KitchenPlease advise

## 2023-02-09 NOTE — Telephone Encounter (Signed)
Patient Name: [name] [name]  Patient DOB: 21-May-1971 Patient ID: [id number]  Status of Request: Approved Medication Name: Xifaxan Tab 559m  GPI/NDC: [id number]

## 2023-02-10 NOTE — Telephone Encounter (Signed)
See telephone contact from 02/09/23.

## 2023-02-15 ENCOUNTER — Telehealth: Payer: Self-pay | Admitting: Gastroenterology

## 2023-02-15 NOTE — Telephone Encounter (Signed)
Inbound call from patient seeking advice if there is anyway she can get Zofran prescribed for her scan that she is having on Friday. Patient is also wanting to know if she can take her medications the day of because she can't go without them. Please advise.

## 2023-02-15 NOTE — Telephone Encounter (Signed)
Spoke with pt. Pt reports she needs zofran for upper GI series because she needs to take her Risperidone and buspar at 6 am and she needs to take those medications with food or else she will vomit. Asked pt if she could wait until after 10 am when Upper GI series is completed to take the 2 medications and pt stated she could not because it is important she take medications at 6 am.

## 2023-02-16 NOTE — Telephone Encounter (Signed)
Called and spoke with Raquel Sarna in the x-ray department at [facility]. She informed me that UGI appts are typically in the mornings and patient's are NPO after midnight. I informed her that patient is adamant about taking her medications at 6 am. They stated if she has an afternoon appt she will have to be NPO 6 hour prior to her appt. I called patient and informed her that we rescheduled her appt to Tuesday, 02/22/23 at 2 pm. Pt is to arrive at [facility] by 1:30 pm and NPO 6 hours prior. Pt states that she also has to take lunch meds about 5 hours after her morning meds. I explained to patient that this test is typically only offered in the morning and they made an exception so that she could take her morning meds with food. I informed patient that if she has to take her medications at specific times within that 6 hour window she may not be able to complete the UGI. I informed pt that if she dies not feel like she can complete the study Dr. Tarri Glenn offered to refer her to a tertiary center for 2nd opinion. Pt decided to keep appt, I provided her with the phone # to radiology scheduling in case she decides to cancel or reschedule. Pt verbalized understanding and had no concerns at the end of the call.

## 2023-02-18 ENCOUNTER — Other Ambulatory Visit (HOSPITAL_COMMUNITY): Payer: Medicare Other

## 2023-02-21 ENCOUNTER — Ambulatory Visit: Payer: Medicare Other | Admitting: Cardiovascular Disease

## 2023-02-22 ENCOUNTER — Ambulatory Visit (HOSPITAL_COMMUNITY)
Admission: RE | Admit: 2023-02-22 | Discharge: 2023-02-22 | Disposition: A | Discharge status: Home / Self Care | Payer: Medicare Other | Source: Ambulatory Visit | Attending: Gastroenterology | Admitting: Gastroenterology

## 2023-02-22 DIAGNOSIS — R109 Unspecified abdominal pain: Secondary | ICD-10-CM

## 2023-02-22 DIAGNOSIS — R140 Abdominal distension (gaseous): Secondary | ICD-10-CM | POA: Diagnosis not present

## 2023-02-22 DIAGNOSIS — Z9884 Bariatric surgery status: Secondary | ICD-10-CM | POA: Diagnosis not present

## 2023-02-22 DIAGNOSIS — E569 Vitamin deficiency, unspecified: Secondary | ICD-10-CM | POA: Diagnosis not present

## 2023-02-22 DIAGNOSIS — K580 Irritable bowel syndrome with diarrhea: Secondary | ICD-10-CM | POA: Diagnosis not present

## 2023-02-23 ENCOUNTER — Other Ambulatory Visit: Payer: Self-pay | Admitting: Hematology and Oncology

## 2023-02-23 ENCOUNTER — Ambulatory Visit: Payer: Medicare Other | Admitting: Cardiovascular Disease

## 2023-02-23 DIAGNOSIS — D509 Iron deficiency anemia, unspecified: Secondary | ICD-10-CM

## 2023-02-23 DIAGNOSIS — E538 Deficiency of other specified B group vitamins: Secondary | ICD-10-CM

## 2023-02-23 NOTE — Progress Notes (Unsigned)
[facility]  [address] [place],  [place]  [address] [phone]  Clinic Day:  02/24/2023  Referring physician: Carvel Getting Key, *   HISTORY OF PRESENT ILLNESS:  The patient is a 52 y.o. female with iron deficiency felt to be most likely related to heavy menses.  Of note, she also had a gastric sleeve procedure about 6 years ago.  She was unable to tolerate oral iron supplement, so was treated with IV Feraheme in April with normalization of her hemoglobin and iron stores.  She was also found to have B12 deficiency and is currently on B12 250 mcg subcutaneously weekly to avoid significant interaction with her risperidone. She is here for repeat clinical assessment and states continues to have fatigue not relieved with rest.  EGD and colonoscopy in December did not reveal any site of recent or active bleeding.  3 gastric erosions were seen and post surgical changes from gastric sleeve procedure.  She had removal of 3 polyps, 1 hyperplastic polyp from the cecum into tubular adenomas from the transverse colon.  Follow-up colonoscopy in 3 years was recommended.  She states her last menses was in December.  She denies any other overt form of blood loss.  She was in the emergency room in January with hypertension and was placed on metoprolol.  She saw her gynecologist in February for vasomotor symptoms and was prescribed Veozah, however, the cost was too much.  She is scheduled for her annual exam and mammogram with her GYN in April.  She saw her gastroenterologist for follow-up in February and she recommended the patient start a multivitamin and a B complex vitamin.  She then saw her bariatric surgeon earlier this week and he recommended a bariatric multivitamin, as well as calcium.  The patient has not started the supplements.  Her bariatric surgeon plans to see her again in 2 months.  The patient requests a vitamin B1/thiamine level today.   Due to her family  history of malignancy, she underwent testing for hereditary cancer syndromes with [facility] CancerNext-Expanded +RNAinsight Panel.  This did not reveal any clinically significant mutation or variants of uncertain significance.  PHYSICAL EXAM:  Blood pressure 107/61, pulse 77, temperature 98.3 F (36.8 C), temperature source Oral, resp. rate 20, height '5\' 3"'$  (1.6 m), weight 156 lb 11.2 oz (71.1 kg), last menstrual period 11/26/2022, SpO2 100 %. Wt Readings from Last 3 Encounters:  02/24/23 156 lb 11.2 oz (71.1 kg)  02/08/23 161 lb (73 kg)  01/24/23 155 lb (70.3 kg)   Body mass index is 27.76 kg/m.  Performance status (ECOG): 1 - Symptomatic but completely ambulatory  Physical Exam Vitals and nursing note reviewed.  Constitutional:      General: She is not in acute distress.    Appearance: Normal appearance.  HENT:     Head: Normocephalic and atraumatic.  Eyes:     General: No scleral icterus.    Extraocular Movements: Extraocular movements intact.     Conjunctiva/sclera: Conjunctivae normal.     Pupils: Pupils are equal, round, and reactive to light.  Cardiovascular:     Rate and Rhythm: Normal rate and regular rhythm.     Heart sounds: Normal heart sounds. No murmur heard.    No friction rub. No gallop.  Pulmonary:     Effort: Pulmonary effort is normal.     Breath sounds: Normal breath sounds. No wheezing, rhonchi or rales.  Abdominal:     General: There is no distension.  Palpations: Abdomen is soft. There is no hepatomegaly, splenomegaly or mass.     Tenderness: There is no abdominal tenderness.  Musculoskeletal:        General: Normal range of motion.     Cervical back: Normal range of motion and neck supple. No tenderness.     Right lower leg: No edema.     Left lower leg: No edema.  Lymphadenopathy:     Cervical: No cervical adenopathy.     Upper Body:     Right upper body: No supraclavicular or axillary adenopathy.     Left upper body: No supraclavicular or  axillary adenopathy.     Lower Body: No right inguinal adenopathy. No left inguinal adenopathy.  Skin:    General: Skin is warm and dry.     Coloration: Skin is not jaundiced.     Findings: No rash.  Neurological:     Mental Status: She is alert and oriented to person, place, and time.     Cranial Nerves: No cranial nerve deficit.  Psychiatric:        Mood and Affect: Mood normal.        Behavior: Behavior normal.        Thought Content: Thought content normal.    LABS:      Latest Ref Rng & Units 02/24/2023    8:51 AM 01/24/2023    3:43 PM 01/09/2023    4:43 PM  CBC  WBC 4.0 - 10.5 K/uL 3.9  4.2  4.9   Hemoglobin 12.0 - 15.0 g/dL 13.1  14.4  14.2   Hematocrit 36.0 - 46.0 % 40.7  42.3  41.3   Platelets 150 - 400 K/uL 230  270  274       Latest Ref Rng & Units 01/24/2023    3:43 PM 01/09/2023    4:43 PM 08/19/2022   12:00 AM  CMP  Glucose 70 - 99 mg/dL 102  122    BUN 6 - 20 mg/dL '9  8  9      '$ Creatinine 0.44 - 1.00 mg/dL 0.77  0.87  0.7      Sodium 135 - 145 mmol/L 140  139  140      Potassium 3.5 - 5.1 mmol/L 3.6  3.5  3.9      Chloride 98 - 111 mmol/L 105  104  107      CO2 22 - 32 mmol/L '25  26  29      '$ Calcium 8.9 - 10.3 mg/dL 9.2  9.0  9.0      Total Protein 6.5 - 8.1 g/dL 6.5  6.8    Total Bilirubin 0.3 - 1.2 mg/dL 0.4  0.4    Alkaline Phos 38 - 126 U/L 77  66  52      AST 15 - 41 U/L '13  15  17      '$ ALT 0 - 44 U/L '11  10  10         '$ This result is from an external source.     No results found for: "CEA1", "CEA" / No results found for: "CEA1", "CEA" No results found for: "PSA1" No results found for: "[id number]" No results found for: "CAN125"  No results found for: "TOTALPROTELP", "ALBUMINELP", "A1GS", "A2GS", "BETS", "BETA2SER", "GAMS", "MSPIKE", "SPEI" Lab Results  Component Value Date   TIBC 276 02/24/2023   TIBC 305 11/24/2022   TIBC 281 08/19/2022   FERRITIN 34 02/24/2023   FERRITIN 39 11/24/2022  FERRITIN 61 08/19/2022   IRONPCTSAT 36 (H) 02/24/2023    IRONPCTSAT 37 (H) 11/24/2022   IRONPCTSAT 40 (H) 08/19/2022   Lab Results  Component Value Date   LDH 94 (L) 03/30/2016       Component Value Date/Time   LDH 94 (L) 03/30/2016 1830    Review Flowsheet  More data exists      Latest Ref Rng & Units 08/19/2022 11/24/2022 02/24/2023  Oncology Labs  Ferritin 11 - 307 ng/mL 61  39  34   %SAT 10.4 - 31.8 % 40  37  36      STUDIES:  DG UGI W SINGLE CM (SOL OR THIN BA)  Result Date: 02/22/2023 CLINICAL DATA:  52 year old female complex abdominal surgical history. Now reporting a bloating feeling with abdominal distention. Request is for single upper GI for further evaluation EXAM: DG UGI W SINGLE CM TECHNIQUE: Scout radiograph was obtained. Single contrast examination was performed using thin liquid barium. This exam was performed Rushie Nyhan NP and was supervised and interpreted by Dr. Zetta Bills FLUOROSCOPY: Radiation Exposure Index (as provided by the fluoroscopic device): 39.3 mGy Kerma COMPARISON:  None Available. FINDINGS: Scout Radiograph: Unremarkable Esophagus:  Normal appearance. Esophageal motility:  Within normal limits. Gastroesophageal reflux:  None visualized. Ingested 91m barium tablet:  Not given Stomach: Postsurgical changes of sleeve gastrectomy. No hiatal hernia noted. Gastric emptying: Normal. Duodenum:  Normal appearance. Other:  None. IMPRESSION: Normal postoperative appearance of sleeve gastrectomy without signs of gastric narrowing or gastroesophageal reflux. Electronically Signed   By: GZetta BillsM.D.   On: 02/22/2023 16:10      ASSESSMENT & PLAN:   Assessment/Plan:  52y.o. female with iron deficiency anemia felt to be due to heavy menses.  Her last menstrual cycle was in December.  She denies other overt forms of blood loss. Her iron stores remain adequate.  Thiamine can't be done today as she is not fasting.  She knows to start the vitamins as recommended by her bariatric surgeon.  She will see her  bariatric surgeon with vitamin levels in 2 months, so I will plan to see her back in 4 months for repeat clinical assessment.  The patient understands all the plans discussed today and is in agreement with them.  She knows to contact our office if she develops concerns prior to her next appointment    KMarvia Pickles PA-C

## 2023-02-24 ENCOUNTER — Inpatient Hospital Stay: Payer: Medicare Other | Attending: Hematology and Oncology | Admitting: Hematology and Oncology

## 2023-02-24 ENCOUNTER — Telehealth: Payer: Self-pay

## 2023-02-24 ENCOUNTER — Inpatient Hospital Stay: Payer: Medicare Other

## 2023-02-24 ENCOUNTER — Encounter: Payer: Self-pay | Admitting: Hematology and Oncology

## 2023-02-24 ENCOUNTER — Other Ambulatory Visit: Payer: Self-pay

## 2023-02-24 VITALS — BP 107/61 | HR 77 | Temp 98.3°F | Resp 20 | Ht 63.0 in | Wt 156.7 lb

## 2023-02-24 DIAGNOSIS — Z9884 Bariatric surgery status: Secondary | ICD-10-CM | POA: Insufficient documentation

## 2023-02-24 DIAGNOSIS — D509 Iron deficiency anemia, unspecified: Secondary | ICD-10-CM | POA: Diagnosis not present

## 2023-02-24 DIAGNOSIS — N920 Excessive and frequent menstruation with regular cycle: Secondary | ICD-10-CM | POA: Insufficient documentation

## 2023-02-24 DIAGNOSIS — E538 Deficiency of other specified B group vitamins: Secondary | ICD-10-CM

## 2023-02-24 DIAGNOSIS — D500 Iron deficiency anemia secondary to blood loss (chronic): Secondary | ICD-10-CM | POA: Insufficient documentation

## 2023-02-24 LAB — IRON AND TIBC
Iron: 99 ug/dL (ref 28–170)
Saturation Ratios: 36 % — ABNORMAL HIGH (ref 10.4–31.8)
TIBC: 276 ug/dL (ref 250–450)
UIBC: 177 ug/dL

## 2023-02-24 LAB — CBC WITH DIFFERENTIAL (CANCER CENTER ONLY)
Abs Immature Granulocytes: 0.01 10*3/uL (ref 0.00–0.07)
Basophils Absolute: 0.1 10*3/uL (ref 0.0–0.1)
Basophils Relative: 1 %
Eosinophils Absolute: 0.1 10*3/uL (ref 0.0–0.5)
Eosinophils Relative: 2 %
HCT: 40.7 % (ref 36.0–46.0)
Hemoglobin: 13.1 g/dL (ref 12.0–15.0)
Immature Granulocytes: 0 %
Lymphocytes Relative: 30 %
Lymphs Abs: 1.2 10*3/uL (ref 0.7–4.0)
MCH: 30.3 pg (ref 26.0–34.0)
MCHC: 32.2 g/dL (ref 30.0–36.0)
MCV: 94 fL (ref 80.0–100.0)
Monocytes Absolute: 0.3 10*3/uL (ref 0.1–1.0)
Monocytes Relative: 9 %
Neutro Abs: 2.2 10*3/uL (ref 1.7–7.7)
Neutrophils Relative %: 58 %
Platelet Count: 230 10*3/uL (ref 150–400)
RBC: 4.33 MIL/uL (ref 3.87–5.11)
RDW: 11.9 % (ref 11.5–15.5)
WBC Count: 3.9 10*3/uL — ABNORMAL LOW (ref 4.0–10.5)
nRBC: 0 % (ref 0.0–0.2)

## 2023-02-24 LAB — VITAMIN D 25 HYDROXY (VIT D DEFICIENCY, FRACTURES): Vit D, 25-Hydroxy: 36.53 ng/mL (ref 30–100)

## 2023-02-24 LAB — FERRITIN: Ferritin: 34 ng/mL (ref 11–307)

## 2023-02-24 LAB — VITAMIN B12: Vitamin B-12: 731 pg/mL (ref 180–914)

## 2023-02-24 LAB — FOLATE: Folate: 24.4 ng/mL (ref >5.9)

## 2023-02-24 NOTE — Telephone Encounter (Signed)
Patient notified and voiced understanding.

## 2023-02-24 NOTE — Telephone Encounter (Signed)
-----   Message from Marvia Pickles, PA-C sent at 02/24/2023  1:55 PM EST ----- Please let her know her hemoglobin, iron and B12 are normal. We could not add vit B1/thiamine because she was not fasting. Continue B12 injections. Start vitamins as recommended by her bariatric surgeon. Thanks you
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
## 2023-03-08 ENCOUNTER — Ambulatory Visit: Payer: Medicare Other | Attending: Cardiovascular Disease | Admitting: Cardiovascular Disease

## 2023-03-08 ENCOUNTER — Encounter: Payer: Self-pay | Admitting: Cardiovascular Disease

## 2023-03-08 ENCOUNTER — Encounter: Payer: Self-pay | Admitting: Gastroenterology

## 2023-03-08 VITALS — BP 162/90 | HR 56 | Ht 63.0 in | Wt 159.4 lb

## 2023-03-08 DIAGNOSIS — R9431 Abnormal electrocardiogram [ECG] [EKG]: Secondary | ICD-10-CM | POA: Diagnosis not present

## 2023-03-08 DIAGNOSIS — I10 Essential (primary) hypertension: Secondary | ICD-10-CM | POA: Diagnosis not present

## 2023-03-08 MED ORDER — LOSARTAN POTASSIUM 25 MG PO TABS: 25.0000 mg | ORAL_TABLET | Freq: Every day | ORAL | 3 refills | Status: DC

## 2023-03-08 NOTE — Patient Instructions (Signed)
Medication Instructions:  Take Metoprolol 1/2 tablet for 3 days, then STOP  After you stop the Metoprolol, start the Losartan 25 mg daily   *If you need a refill on your cardiac medications before your next appointment, please call your pharmacy*   Testing/Procedures:  Echocardiogram - Your physician has requested that you have an echocardiogram. Echocardiography is a painless test that uses sound waves to create images of your heart. It provides your doctor with information about the size and shape of your heart and how well your heart's chambers and valves are working. This procedure takes approximately one hour. There are no restrictions for this procedure.     Follow-Up: At [facility], you and your health needs are our priority.  As part of our continuing mission to provide you with exceptional heart care, we have created designated Provider Care Teams.  These Care Teams include your primary Cardiologist (physician) and Advanced Practice Providers (APPs -  Physician Assistants and Nurse Practitioners) who all work together to provide you with the care you need, when you need it.  We recommend signing up for the patient portal called "MyChart".  Sign up information is provided on this After Visit Summary.  MyChart is used to connect with patients for Virtual Visits (Telemedicine).  Patients are able to view lab/test results, encounter notes, upcoming appointments, etc.  Non-urgent messages can be sent to your provider as well.   To learn more about what you can do with MyChart, go to [email or web address].    Your next appointment:   3 month(s)  Provider:   Sande Rives, PA-C, Almyra Deforest, PA-C, or Diona Browner, NP    Then, Eleonore Chiquito, MD will plan to see you again in 12 month(s).

## 2023-03-10 NOTE — Telephone Encounter (Signed)
Noted  

## 2023-04-05 ENCOUNTER — Ambulatory Visit: Payer: Medicare Other | Admitting: Gastroenterology

## 2023-04-05 ENCOUNTER — Encounter: Payer: Self-pay | Admitting: Gastroenterology

## 2023-04-05 VITALS — BP 124/82 | HR 79 | Ht 63.0 in | Wt 161.0 lb

## 2023-04-05 DIAGNOSIS — D509 Iron deficiency anemia, unspecified: Secondary | ICD-10-CM | POA: Diagnosis not present

## 2023-04-05 DIAGNOSIS — R109 Unspecified abdominal pain: Secondary | ICD-10-CM | POA: Diagnosis not present

## 2023-04-05 DIAGNOSIS — R140 Abdominal distension (gaseous): Secondary | ICD-10-CM

## 2023-04-05 DIAGNOSIS — K259 Gastric ulcer, unspecified as acute or chronic, without hemorrhage or perforation: Secondary | ICD-10-CM

## 2023-04-05 MED ORDER — RIFAXIMIN 550 MG PO TABS: 550.0000 mg | ORAL_TABLET | Freq: Three times a day (TID) | ORAL | 0 refills | Status: DC
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
## 2023-04-05 NOTE — Patient Instructions (Signed)
We have sent the following medications to your pharmacy for you to pick up at your convenience: Xifaxan 550 mg three times daily for 14 days.   Follow up in 3 months or sooner if needed with Willette Cluster, PA.   _______________________________________________________  If your blood pressure at your visit was 140/90 or greater, please contact your primary care physician to follow up on this.  _______________________________________________________  If you are age 47 or older, your body mass index should be between 23-30. Your Body mass index is 28.52 kg/m. If this is out of the aforementioned range listed, please consider follow up with your Primary Care Provider.  If you are age 69 or younger, your body mass index should be between 19-25. Your Body mass index is 28.52 kg/m. If this is out of the aformentioned range listed, please consider follow up with your Primary Care Provider.   ________________________________________________________  The [facility] providers would like to encourage you to use [facility] to communicate with providers for non-urgent requests or questions.  Due to long hold times on the telephone, sending your provider a message by [facility] may be a faster and more efficient way to get a response.  Please allow 48 business hours for a response.  Please remember that this is for non-urgent requests.  _______________________________________________________

## 2023-04-06 DIAGNOSIS — Z1231 Encounter for screening mammogram for malignant neoplasm of breast: Secondary | ICD-10-CM | POA: Diagnosis not present

## 2023-04-07 ENCOUNTER — Telehealth: Payer: Self-pay | Admitting: Gastroenterology

## 2023-04-07 ENCOUNTER — Telehealth: Payer: Self-pay | Admitting: *Deleted

## 2023-04-07 ENCOUNTER — Ambulatory Visit (HOSPITAL_COMMUNITY): Payer: Medicare Other | Attending: Internal Medicine

## 2023-04-07 DIAGNOSIS — I10 Essential (primary) hypertension: Secondary | ICD-10-CM

## 2023-04-07 DIAGNOSIS — R9431 Abnormal electrocardiogram [ECG] [EKG]: Secondary | ICD-10-CM

## 2023-04-07 LAB — ECHOCARDIOGRAM COMPLETE
Area-P 1/2: 3.81 cm2
S' Lateral: 3.2 cm

## 2023-04-07 MED ORDER — DOXYCYCLINE HYCLATE 100 MG PO CAPS: 100.0000 mg | ORAL_CAPSULE | Freq: Two times a day (BID) | ORAL | 0 refills | Status: AC

## 2023-04-07 NOTE — Telephone Encounter (Signed)
PRIOR AUTHORIZATION  PA initiation date: 04/06/23  Medication: Xifaxan Insurance Company: [employer] completed electronically through [employer] My Meds: Yes Key: [id number]   Will await insurance response re: approval/denial.

## 2023-04-07 NOTE — Telephone Encounter (Signed)
Sent in Doxycyline as a cheaper option.

## 2023-04-07 NOTE — Telephone Encounter (Signed)
Inbound call from [facility], stated Copay for Xifaxin was too expensive for patient, is requesting an alternative.

## 2023-04-07 NOTE — Telephone Encounter (Signed)
APPROVAL  Medication: Xifaxan 550 mg Insurance Company: [employer] PA response: Approved Approval dates: 04/06/23-04/20/23 Reference #: [id number]

## 2023-04-22 ENCOUNTER — Encounter: Payer: Self-pay | Admitting: Hematology and Oncology

## 2023-04-25 ENCOUNTER — Ambulatory Visit (INDEPENDENT_AMBULATORY_CARE_PROVIDER_SITE_OTHER): Payer: Medicare Other | Admitting: Physician Assistant

## 2023-04-25 ENCOUNTER — Encounter: Payer: Self-pay | Admitting: Physician Assistant

## 2023-04-25 DIAGNOSIS — F250 Schizoaffective disorder, bipolar type: Secondary | ICD-10-CM

## 2023-04-25 DIAGNOSIS — F5105 Insomnia due to other mental disorder: Secondary | ICD-10-CM | POA: Diagnosis not present

## 2023-04-25 DIAGNOSIS — F4310 Post-traumatic stress disorder, unspecified: Secondary | ICD-10-CM

## 2023-04-25 DIAGNOSIS — F419 Anxiety disorder, unspecified: Secondary | ICD-10-CM | POA: Diagnosis not present

## 2023-04-25 DIAGNOSIS — F99 Mental disorder, not otherwise specified: Secondary | ICD-10-CM

## 2023-04-25 MED ORDER — BUSPIRONE HCL 15 MG PO TABS: 15.0000 mg | ORAL_TABLET | Freq: Three times a day (TID) | ORAL | 3 refills | Status: DC
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
## 2023-04-26 DIAGNOSIS — G8929 Other chronic pain: Secondary | ICD-10-CM | POA: Diagnosis not present

## 2023-04-26 DIAGNOSIS — R202 Paresthesia of skin: Secondary | ICD-10-CM | POA: Diagnosis not present

## 2023-04-26 DIAGNOSIS — M549 Dorsalgia, unspecified: Secondary | ICD-10-CM | POA: Diagnosis not present

## 2023-05-17 DIAGNOSIS — D2239 Melanocytic nevi of other parts of face: Secondary | ICD-10-CM | POA: Diagnosis not present

## 2023-05-17 DIAGNOSIS — D225 Melanocytic nevi of trunk: Secondary | ICD-10-CM | POA: Diagnosis not present

## 2023-05-17 DIAGNOSIS — D485 Neoplasm of uncertain behavior of skin: Secondary | ICD-10-CM | POA: Diagnosis not present

## 2023-05-17 DIAGNOSIS — L814 Other melanin hyperpigmentation: Secondary | ICD-10-CM | POA: Diagnosis not present

## 2023-05-17 DIAGNOSIS — L821 Other seborrheic keratosis: Secondary | ICD-10-CM | POA: Diagnosis not present

## 2023-05-24 ENCOUNTER — Encounter: Payer: Self-pay | Admitting: Hematology and Oncology

## 2023-06-07 DIAGNOSIS — M5416 Radiculopathy, lumbar region: Secondary | ICD-10-CM | POA: Diagnosis not present

## 2023-06-07 DIAGNOSIS — G8929 Other chronic pain: Secondary | ICD-10-CM | POA: Diagnosis not present

## 2023-06-07 DIAGNOSIS — M549 Dorsalgia, unspecified: Secondary | ICD-10-CM | POA: Diagnosis not present

## 2023-06-08 ENCOUNTER — Encounter: Payer: Self-pay | Admitting: Nurse Practitioner

## 2023-06-08 ENCOUNTER — Ambulatory Visit: Payer: Medicare Other | Attending: Nurse Practitioner | Admitting: Nurse Practitioner

## 2023-06-08 VITALS — BP 160/98 | HR 100 | Ht 63.0 in | Wt 165.8 lb

## 2023-06-08 DIAGNOSIS — I10 Essential (primary) hypertension: Secondary | ICD-10-CM | POA: Diagnosis not present

## 2023-06-08 DIAGNOSIS — R001 Bradycardia, unspecified: Secondary | ICD-10-CM | POA: Diagnosis not present

## 2023-06-08 DIAGNOSIS — Z87828 Personal history of other (healed) physical injury and trauma: Secondary | ICD-10-CM

## 2023-06-08 DIAGNOSIS — R9431 Abnormal electrocardiogram [ECG] [EKG]: Secondary | ICD-10-CM
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
## 2023-06-08 NOTE — Patient Instructions (Addendum)
Medication Instructions:  Your physician recommends that you continue on your current medications as directed. Please refer to the Current Medication list given to you today.  *If you need a refill on your cardiac medications before your next appointment, please call your pharmacy*   Lab Work: NONE ordered at this time of appointment    Testing/Procedures: NONE ordered at this time of appointment     Follow-Up: At [facility], you and your health needs are our priority.  As part of our continuing mission to provide you with exceptional heart care, we have created designated Provider Care Teams.  These Care Teams include your primary Cardiologist (physician) and Advanced Practice Providers (APPs -  Physician Assistants and Nurse Practitioners) who all work together to provide you with the care you need, when you need it.  We recommend signing up for the patient portal called "MyChart".  Sign up information is provided on this After Visit Summary.  MyChart is used to connect with patients for Virtual Visits (Telemedicine).  Patients are able to view lab/test results, encounter notes, upcoming appointments, etc.  Non-urgent messages can be sent to your provider as well.   To learn more about what you can do with MyChart, go to [email or web address].    Your next appointment:   1 year(s)  Provider:   Reatha Harps, MD     Other Instructions Monitor blood pressure. Goal BP is 130/80. Please report if it's consistently greater than that.
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
## 2023-06-21 ENCOUNTER — Encounter: Payer: Self-pay | Admitting: Nurse Practitioner

## 2023-06-21 ENCOUNTER — Ambulatory Visit: Payer: Medicare Other | Admitting: Nurse Practitioner

## 2023-06-21 VITALS — BP 124/88 | HR 78 | Ht 63.0 in | Wt 162.0 lb

## 2023-06-21 DIAGNOSIS — R140 Abdominal distension (gaseous): Secondary | ICD-10-CM

## 2023-06-21 DIAGNOSIS — R194 Change in bowel habit: Secondary | ICD-10-CM

## 2023-06-21 NOTE — Patient Instructions (Signed)
_______________________________________________________  If your blood pressure at your visit was 140/90 or greater, please contact your primary care physician to follow up on this.  If you are age 52 or younger, your body mass index should be between 19-25. Your Body mass index is 28.7 kg/m. If this is out of the aformentioned range listed, please consider follow up with your Primary Care Provider.  ________________________________________________________  The [facility] providers would like to encourage you to use [facility] to communicate with providers for non-urgent requests or questions.  Due to long hold times on the telephone, sending your provider a message by [facility] may be a faster and more efficient way to get a response.  Please allow 48 business hours for a response.  Please remember that this is for non-urgent requests.  _______________________________________________________  Tania Ade daily.  USE Glycerine suppositories as needed for constipation.  You will follow up in our office in 1 year.  We will contact you to schedule this appointment.  Thank you for entrusting me with your care and choosing [facility].  Gunnar Fusi, NP

## 2023-06-22 DIAGNOSIS — M5416 Radiculopathy, lumbar region: Secondary | ICD-10-CM | POA: Diagnosis not present

## 2023-06-22 NOTE — Progress Notes (Unsigned)
[facility]  [address] [place],  [place]  [address] [phone]  Clinic Day:  06/23/2023  Referring physician: Mikki Santee Key, *   HISTORY OF PRESENT ILLNESS:  The patient is a 52 y.o. female with a history of iron deficiency felt to be most likely related to heavy menses, who we began seeing in April 2023.  Of note, she also has had a gastric sleeve procedure. She was unable to tolerate oral iron supplement, so was treated with IV Feraheme in April 2023 with normalization of her hemoglobin and iron stores.  She has not required retreatment with IV iron.  She was also found to have B12 deficiency and continues B12 250 mcg subcutaneously weekly.  This is being done to avoid significant interaction with her risperidone.  EGD and colonoscopy in December did not reveal any site of recent or active bleeding.  3 gastric erosions were seen and post surgical changes from. She had removal of 3 polyps, 1 hyperplastic polyp from the cecum into tubular adenomas from the transverse colon.  Follow-up colonoscopy in 3 years was recommended.  Her bariatric surgeon recommended a bariatric multivitamin, as well as calcium.    She is here today for routine follow-up and continues to report fatigue not relieved at rest.  She has noted some mild shortness of breath with exertion, which she attributes to the hot temperatures.  She is now taking a bariatric multivitamin without iron.    Due to her family history of malignancy, she underwent testing for hereditary cancer syndromes with [facility] CancerNext-Expanded +RNAinsight Panel.  This did not reveal any clinically significant mutation or variants of uncertain significance.    PHYSICAL EXAM:  Blood pressure (!) 142/73, pulse 74, temperature 98.1 F (36.7 C), temperature source Oral, resp. rate 18, height 5\' 3"  (1.6 m), weight 163 lb (73.9 kg), SpO2 100 %. Wt Readings from Last 3 Encounters:  06/23/23 163 lb (73.9 kg)   06/21/23 162 lb (73.5 kg)  06/08/23 165 lb 12.8 oz (75.2 kg)   Body mass index is 28.87 kg/m.  Performance status (ECOG): 1 - Symptomatic but completely ambulatory  Physical Exam Vitals and nursing note reviewed.  Constitutional:      General: She is not in acute distress.    Appearance: Normal appearance.  HENT:     Head: Normocephalic and atraumatic.     Mouth/Throat:     Mouth: Mucous membranes are moist.     Pharynx: Oropharynx is clear. No oropharyngeal exudate or posterior oropharyngeal erythema.  Eyes:     General: No scleral icterus.    Extraocular Movements: Extraocular movements intact.     Conjunctiva/sclera: Conjunctivae normal.     Pupils: Pupils are equal, round, and reactive to light.  Cardiovascular:     Rate and Rhythm: Normal rate and regular rhythm.     Heart sounds: Normal heart sounds. No murmur heard.    No friction rub. No gallop.  Pulmonary:     Effort: Pulmonary effort is normal.     Breath sounds: Normal breath sounds. No wheezing, rhonchi or rales.  Abdominal:     General: There is no distension.     Palpations: Abdomen is soft. There is no mass.     Tenderness: There is no abdominal tenderness.  Musculoskeletal:        General: Normal range of motion.     Cervical back: Normal range of motion and neck supple. No tenderness.     Right lower  leg: No edema.     Left lower leg: No edema.  Lymphadenopathy:     Cervical: No cervical adenopathy.  Skin:    General: Skin is warm and dry.     Coloration: Skin is not jaundiced.     Findings: No rash.  Neurological:     Mental Status: She is alert and oriented to person, place, and time.     Cranial Nerves: No cranial nerve deficit.  Psychiatric:        Mood and Affect: Mood normal.        Behavior: Behavior normal.        Thought Content: Thought content normal.     LABS:      Latest Ref Rng & Units 06/23/2023    8:22 AM 02/24/2023    8:51 AM 01/24/2023    3:43 PM  CBC  WBC 4.0 - 10.5  K/uL 3.5  3.9  4.2   Hemoglobin 12.0 - 15.0 g/dL 84.6  96.2  95.2   Hematocrit 36.0 - 46.0 % 40.9  40.7  42.3   Platelets 150 - 400 K/uL 230  230  270       Latest Ref Rng & Units 01/24/2023    3:43 PM 01/09/2023    4:43 PM 08/19/2022   12:00 AM  CMP  Glucose 70 - 99 mg/dL 841  324    BUN 6 - 20 mg/dL 9  8  9       Creatinine 0.44 - 1.00 mg/dL 4.01  0.27  0.7      Sodium 135 - 145 mmol/L 140  139  140      Potassium 3.5 - 5.1 mmol/L 3.6  3.5  3.9      Chloride 98 - 111 mmol/L 105  104  107      CO2 22 - 32 mmol/L 25  26  29       Calcium 8.9 - 10.3 mg/dL 9.2  9.0  9.0      Total Protein 6.5 - 8.1 g/dL 6.5  6.8    Total Bilirubin 0.3 - 1.2 mg/dL 0.4  0.4    Alkaline Phos 38 - 126 U/L 77  66  52      AST 15 - 41 U/L 13  15  17       ALT 0 - 44 U/L 11  10  10          This result is from an external source.     No results found for: "CEA1", "CEA" / No results found for: "CEA1", "CEA" No results found for: "PSA1" No results found for: "OZD664" No results found for: "CAN125"  No results found for: "TOTALPROTELP", "ALBUMINELP", "A1GS", "A2GS", "BETS", "BETA2SER", "GAMS", "MSPIKE", "SPEI" Lab Results  Component Value Date   TIBC 314 06/23/2023   TIBC 276 02/24/2023   TIBC 305 11/24/2022   FERRITIN 34 06/23/2023   FERRITIN 34 02/24/2023   FERRITIN 39 11/24/2022   IRONPCTSAT 39 (H) 06/23/2023   IRONPCTSAT 36 (H) 02/24/2023   IRONPCTSAT 37 (H) 11/24/2022   Lab Results  Component Value Date   LDH 94 (L) 03/30/2016       Component Value Date/Time   LDH 94 (L) 03/30/2016 1830    Review Flowsheet  More data exists      Latest Ref Rng & Units 11/24/2022 02/24/2023 06/23/2023  Oncology Labs  Ferritin 11 - 307 ng/mL 39  34  34   %SAT 10.4 - 31.8 % 37  36  39      STUDIES:  No results found.    ASSESSMENT & PLAN:   Assessment/Plan:  52 y.o. female with both iron and B12 deficiency.  Both have been repleted and she has not had recurrent iron deficiency.  B12 and folate are  normal.  She has mild intermittent leukopenia back to October 2022.  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  We will plan to continue follow-up and see her back in 6 months for repeat clinical assessment.  The patient understands all the plans discussed today and is in agreement with them.  She knows to contact our office if she develops concerns prior to her next appointment.     [name], PA-C   Physician Assistant [facility] [phone]

## 2023-06-22 NOTE — Progress Notes (Signed)
Agree with assessment/plan.  Raj Jeffifer Rabold, MD [facility] [phone]  

## 2023-06-23 ENCOUNTER — Other Ambulatory Visit: Payer: Self-pay | Admitting: Hematology and Oncology

## 2023-06-23 ENCOUNTER — Encounter: Payer: Self-pay | Admitting: Hematology and Oncology

## 2023-06-23 ENCOUNTER — Inpatient Hospital Stay: Payer: Medicare Other | Attending: Hematology and Oncology | Admitting: Hematology and Oncology

## 2023-06-23 ENCOUNTER — Telehealth: Payer: Self-pay

## 2023-06-23 ENCOUNTER — Inpatient Hospital Stay: Payer: Medicare Other

## 2023-06-23 VITALS — BP 142/73 | HR 74 | Temp 98.1°F | Resp 18 | Ht 63.0 in | Wt 163.0 lb

## 2023-06-23 DIAGNOSIS — E538 Deficiency of other specified B group vitamins: Secondary | ICD-10-CM | POA: Diagnosis not present

## 2023-06-23 DIAGNOSIS — Z9884 Bariatric surgery status: Secondary | ICD-10-CM

## 2023-06-23 DIAGNOSIS — E611 Iron deficiency: Secondary | ICD-10-CM | POA: Insufficient documentation

## 2023-06-23 DIAGNOSIS — D509 Iron deficiency anemia, unspecified: Secondary | ICD-10-CM

## 2023-06-23 DIAGNOSIS — Z862 Personal history of diseases of the blood and blood-forming organs and certain disorders involving the immune mechanism: Secondary | ICD-10-CM | POA: Insufficient documentation

## 2023-06-23 LAB — CBC WITH DIFFERENTIAL (CANCER CENTER ONLY)
Abs Immature Granulocytes: 0.01 10*3/uL (ref 0.00–0.07)
Basophils Absolute: 0 10*3/uL (ref 0.0–0.1)
Basophils Relative: 1 %
Eosinophils Absolute: 0.1 10*3/uL (ref 0.0–0.5)
Eosinophils Relative: 3 %
HCT: 40.9 % (ref 36.0–46.0)
Hemoglobin: 13.2 g/dL (ref 12.0–15.0)
Immature Granulocytes: 0 %
Lymphocytes Relative: 25 %
Lymphs Abs: 0.9 10*3/uL (ref 0.7–4.0)
MCH: 30.2 pg (ref 26.0–34.0)
MCHC: 32.3 g/dL (ref 30.0–36.0)
MCV: 93.6 fL (ref 80.0–100.0)
Monocytes Absolute: 0.3 10*3/uL (ref 0.1–1.0)
Monocytes Relative: 9 %
Neutro Abs: 2.2 10*3/uL (ref 1.7–7.7)
Neutrophils Relative %: 62 %
Platelet Count: 230 10*3/uL (ref 150–400)
RBC: 4.37 MIL/uL (ref 3.87–5.11)
RDW: 12.2 % (ref 11.5–15.5)
WBC Count: 3.5 10*3/uL — ABNORMAL LOW (ref 4.0–10.5)
nRBC: 0 % (ref 0.0–0.2)

## 2023-06-23 LAB — IRON AND TIBC
Iron: 123 ug/dL (ref 28–170)
Saturation Ratios: 39 % — ABNORMAL HIGH (ref 10.4–31.8)
TIBC: 314 ug/dL (ref 250–450)
UIBC: 191 ug/dL

## 2023-06-23 LAB — VITAMIN B12: Vitamin B-12: 1903 pg/mL — ABNORMAL HIGH (ref 180–914)

## 2023-06-23 LAB — FERRITIN: Ferritin: 34 ng/mL (ref 11–307)

## 2023-06-23 LAB — FOLATE: Folate: >40 ng/mL (ref >5.9)

## 2023-06-23 MED ORDER — CYANOCOBALAMIN 1000 MCG/ML IJ SOLN: INTRAMUSCULAR | 3 refills | Status: DC

## 2023-06-23 NOTE — Telephone Encounter (Signed)
-----   Message from [name], PA-C sent at 06/23/2023  3:09 PM EDT ----- Please let her know her hemoglobin is normal. Iron stores still good. B12 and folate good. White count a little low, so will continue to follow her and see her in 6 months unless something changes in how she feels. Thanks

## 2023-06-23 NOTE — Telephone Encounter (Signed)
Patient notified and voiced understanding. Patient request refill on B [facility]ds [facility].

## 2023-06-24 ENCOUNTER — Encounter: Payer: Self-pay | Admitting: Hematology and Oncology

## 2023-07-06 ENCOUNTER — Ambulatory Visit: Payer: Medicare Other | Admitting: Nurse Practitioner

## 2023-07-06 DIAGNOSIS — M5416 Radiculopathy, lumbar region: Secondary | ICD-10-CM | POA: Diagnosis not present

## 2023-07-13 DIAGNOSIS — M5416 Radiculopathy, lumbar region: Secondary | ICD-10-CM | POA: Diagnosis not present

## 2023-07-21 DIAGNOSIS — M5416 Radiculopathy, lumbar region: Secondary | ICD-10-CM | POA: Diagnosis not present

## 2023-07-26 DIAGNOSIS — M5416 Radiculopathy, lumbar region: Secondary | ICD-10-CM | POA: Diagnosis not present

## 2023-08-03 ENCOUNTER — Telehealth: Payer: Self-pay | Admitting: Cardiovascular Disease

## 2023-08-03 NOTE — Telephone Encounter (Signed)
Patient states her blood pressure ahas been running high for over a week. Her heart rate has been elevating as well. She has had a headache and been very dizzy and gets SOB when she is walking. All of her readings have been in the morning she does not take her losartan until bedtime. She stated her abdomen is swollen and hard. Abdomen is painful to touch. Her son will take her to ED. 8/1 142/91 no hr 8/4 160/96 hr 85 8/5 151/96 hr-90 8/6 145/96 hr 85

## 2023-08-03 NOTE — Telephone Encounter (Signed)
  Pt c/o BP issue: STAT if pt c/o blurred vision, one-sided weakness or slurred speech  1. What are your last 5 BP readings?  172/95, bottom number consistently at 95 or higher for the last couple of weeks   2. Are you having any other symptoms (ex. Dizziness, headache, blurred vision, passed out)? Fatigue, dizziness and sob   3. What is your BP issue?   The patient reported that her blood pressure has been elevated, especially the bottom number. She mentioned that every time she walks, she becomes short of breath and feels dizzy, requiring her to stop and sit down to catch her breath. She wanted to check if she needs to increase her blood pressure medications. She will have a dentist appointment and requested that a message be left if she is unable to answer her phone.

## 2023-08-04 ENCOUNTER — Encounter: Payer: Self-pay | Admitting: Nurse Practitioner

## 2023-08-04 ENCOUNTER — Telehealth: Payer: Self-pay | Admitting: Cardiovascular Disease

## 2023-08-04 ENCOUNTER — Ambulatory Visit: Payer: Medicare Other | Attending: Nurse Practitioner | Admitting: Nurse Practitioner

## 2023-08-04 VITALS — BP 170/110 | HR 83 | Ht 63.0 in | Wt 166.0 lb

## 2023-08-04 DIAGNOSIS — R001 Bradycardia, unspecified: Secondary | ICD-10-CM

## 2023-08-04 DIAGNOSIS — I10 Essential (primary) hypertension: Secondary | ICD-10-CM | POA: Diagnosis not present

## 2023-08-04 DIAGNOSIS — R9431 Abnormal electrocardiogram [ECG] [EKG]: Secondary | ICD-10-CM

## 2023-08-04 MED ORDER — OLMESARTAN MEDOXOMIL 40 MG PO TABS: 40.0000 mg | ORAL_TABLET | Freq: Every day | ORAL | 3 refills | Status: DC

## 2023-08-04 NOTE — Telephone Encounter (Signed)
*  STAT* If patient is at the pharmacy, call can be transferred to refill team.   1. Which medications need to be refilled? (please list name of each medication and dose if known)  olmesartan (BENICAR) 40 MG tablet   2. Which pharmacy/location (including street and city if local pharmacy) is medication to be sent to? [facility] - [place], [place] - [address]    3. Do they need a 30 day or 90 day supply? 90 Day Supply

## 2023-08-04 NOTE — Telephone Encounter (Signed)
Pt's medication was sent to pt's pharmacy as requested. Confirmation received.  °

## 2023-08-04 NOTE — Patient Instructions (Addendum)
Medication Instructions:  Stop Losartan as directed. Start Olmesartan 40 mg daily   *If you need a refill on your cardiac medications before your next appointment, please call your pharmacy*   Lab Work: BMET in 2 weeks    Testing/Procedures: NONE ordered at this time of appointment     Follow-Up: At [facility], you and your health needs are our priority.  As part of our continuing mission to provide you with exceptional heart care, we have created designated Provider Care Teams.  These Care Teams include your primary Cardiologist (physician) and Advanced Practice Providers (APPs -  Physician Assistants and Nurse Practitioners) who all work together to provide you with the care you need, when you need it.  We recommend signing up for the patient portal called "MyChart".  Sign up information is provided on this After Visit Summary.  MyChart is used to connect with patients for Virtual Visits (Telemedicine).  Patients are able to view lab/test results, encounter notes, upcoming appointments, etc.  Non-urgent messages can be sent to your provider as well.   To learn more about what you can do with MyChart, go to [email or web address].    Your next appointment:   4-6 week(s)  Provider:   Bernadene Person, NP        Other Instructions Report Systolic BP consistently greater than 140/80.
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
## 2023-08-15 DIAGNOSIS — I10 Essential (primary) hypertension: Secondary | ICD-10-CM | POA: Diagnosis not present

## 2023-08-15 DIAGNOSIS — R001 Bradycardia, unspecified: Secondary | ICD-10-CM | POA: Diagnosis not present

## 2023-08-15 DIAGNOSIS — R9431 Abnormal electrocardiogram [ECG] [EKG]: Secondary | ICD-10-CM | POA: Diagnosis not present

## 2023-08-16 ENCOUNTER — Other Ambulatory Visit: Payer: Self-pay

## 2023-08-16 DIAGNOSIS — L299 Pruritus, unspecified: Secondary | ICD-10-CM | POA: Diagnosis not present

## 2023-08-16 DIAGNOSIS — L300 Nummular dermatitis: Secondary | ICD-10-CM | POA: Diagnosis not present

## 2023-08-16 DIAGNOSIS — L308 Other specified dermatitis: Secondary | ICD-10-CM | POA: Diagnosis not present

## 2023-08-16 DIAGNOSIS — D485 Neoplasm of uncertain behavior of skin: Secondary | ICD-10-CM | POA: Diagnosis not present

## 2023-08-16 LAB — BASIC METABOLIC PANEL
Calcium: 9.4 mg/dL (ref 8.7–10.2)
Chloride: 107 mmol/L — ABNORMAL HIGH (ref 96–106)
Potassium: 4.5 mmol/L (ref 3.5–5.2)
Sodium: 144 mmol/L (ref 134–144)

## 2023-08-16 MED ORDER — AMLODIPINE BESYLATE 10 MG PO TABS: 10.0000 mg | ORAL_TABLET | Freq: Every day | ORAL | 3 refills | Status: DC

## 2023-08-17 DIAGNOSIS — M5416 Radiculopathy, lumbar region: Secondary | ICD-10-CM | POA: Diagnosis not present

## 2023-08-25 ENCOUNTER — Encounter: Payer: Self-pay | Admitting: Physician Assistant

## 2023-08-25 ENCOUNTER — Ambulatory Visit: Payer: Medicare Other | Admitting: Physician Assistant

## 2023-08-25 DIAGNOSIS — F5105 Insomnia due to other mental disorder: Secondary | ICD-10-CM

## 2023-08-25 DIAGNOSIS — F419 Anxiety disorder, unspecified: Secondary | ICD-10-CM

## 2023-08-25 DIAGNOSIS — F4310 Post-traumatic stress disorder, unspecified: Secondary | ICD-10-CM | POA: Diagnosis not present

## 2023-08-25 DIAGNOSIS — F99 Mental disorder, not otherwise specified: Secondary | ICD-10-CM

## 2023-08-25 DIAGNOSIS — M5416 Radiculopathy, lumbar region: Secondary | ICD-10-CM | POA: Diagnosis not present

## 2023-08-25 MED ORDER — TRAZODONE HCL 150 MG PO TABS: 225.0000 mg | ORAL_TABLET | Freq: Every evening | ORAL | Status: DC | PRN

## 2023-08-25 MED ORDER — RISPERIDONE 1 MG PO TABS: ORAL_TABLET | ORAL | Status: DC
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
## 2023-09-01 ENCOUNTER — Encounter: Payer: Self-pay | Admitting: Nurse Practitioner

## 2023-09-01 ENCOUNTER — Ambulatory Visit: Payer: Medicare Other | Attending: Nurse Practitioner | Admitting: Nurse Practitioner

## 2023-09-01 VITALS — BP 126/70 | HR 85 | Ht 63.0 in | Wt 167.2 lb

## 2023-09-01 DIAGNOSIS — R9431 Abnormal electrocardiogram [ECG] [EKG]: Secondary | ICD-10-CM | POA: Diagnosis not present

## 2023-09-01 DIAGNOSIS — R001 Bradycardia, unspecified: Secondary | ICD-10-CM | POA: Diagnosis not present

## 2023-09-01 DIAGNOSIS — I10 Essential (primary) hypertension: Secondary | ICD-10-CM | POA: Diagnosis not present
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
## 2023-09-01 NOTE — Patient Instructions (Signed)
Medication Instructions:  Your physician recommends that you continue on your current medications as directed. Please refer to the Current Medication list given to you today. *If you need a refill on your cardiac medications before your next appointment, please call your pharmacy*   Lab Work: No Labs If you have labs (blood work) drawn today and your tests are completely normal, you will receive your results only by: MyChart Message (if you have MyChart) OR A paper copy in the mail If you have any lab test that is abnormal or we need to change your treatment, we will call you to review the results.   Testing/Procedures: No testing   Follow-Up: At [facility], you and your health needs are our priority.  As part of our continuing mission to provide you with exceptional heart care, we have created designated Provider Care Teams.  These Care Teams include your primary Cardiologist (physician) and Advanced Practice Providers (APPs -  Physician Assistants and Nurse Practitioners) who all work together to provide you with the care you need, when you need it.  We recommend signing up for the patient portal called "MyChart".  Sign up information is provided on this After Visit Summary.  MyChart is used to connect with patients for Virtual Visits (Telemedicine).  Patients are able to view lab/test results, encounter notes, upcoming appointments, etc.  Non-urgent messages can be sent to your provider as well.   To learn more about what you can do with MyChart, go to [email or web address].    Your next appointment:   6 month(s)  Provider:   Reatha Harps, MD

## 2023-09-05 ENCOUNTER — Other Ambulatory Visit: Payer: Self-pay

## 2023-09-05 DIAGNOSIS — R140 Abdominal distension (gaseous): Secondary | ICD-10-CM

## 2023-09-05 DIAGNOSIS — K259 Gastric ulcer, unspecified as acute or chronic, without hemorrhage or perforation: Secondary | ICD-10-CM

## 2023-09-05 MED ORDER — PANTOPRAZOLE SODIUM 40 MG PO TBEC
40.0000 mg | DELAYED_RELEASE_TABLET | Freq: Every day | ORAL | 3 refills | Status: DC
Start: 2023-09-05 — End: 2024-05-14

## 2023-09-05 NOTE — Telephone Encounter (Signed)
Confirmed with [name] that she is on the protonix 40mg  tablets not the 20mg  on her med list. Updated list. Refilled as requested to [facility].

## 2023-09-09 NOTE — Telephone Encounter (Signed)
Patient states starting amlodipine 3-4 weeks ago. Started with sharp pains Left side, middle of breast area. Last approx 4-5 times quickly then start 4-5 hours later.  It is a really fast sharp pain.  One time yesterday "it spread out, like spreading out your hand".  No SOB, no N & V, or any other symptoms.  No pain at this time.   She states she had 2-3 pains this morning while in shower and then quit.  Happens at random times.  This just started Wednesday afternoon, was not having at last OV 8/8. ED precautions given.  Advised will send to provider for recommendations.

## 2023-09-13 ENCOUNTER — Telehealth: Payer: Self-pay | Admitting: Cardiovascular Disease

## 2023-09-13 NOTE — Telephone Encounter (Signed)
Pt c/o BP issue: STAT if pt c/o blurred vision, one-sided weakness or slurred speech  1. What are your last 5 BP readings? 111/68, 115/57, 143/108, 125/82, 131/56   2. Are you having any other symptoms (ex. Dizziness, headache, blurred vision, passed out)? Headache and fatigued   3. What is your BP issue? Pt states that her BP is fluctuating and causing her to be extremely tired. Please advise

## 2023-09-14 NOTE — Telephone Encounter (Signed)
Called with information from provider  BP looks ok. OK to stay off amlodipine. Continue to keep any eye on BP, symptoms and notify [place] of any concerns. OK to travel. Thank you. -EM    She states understanding and will call if symptoms continue

## 2023-09-14 NOTE — Telephone Encounter (Signed)
Patient states these are with no Medications. Taken off amlodipine per Dr Scharlene Gloss on 9/5.Tired and H/A for past 3 days.  Chest pain stopped after stopping the amlodipine.  Patietn going out of town Saturday for two weeks just wants to be sure prior to leaving, that her BP is OK  9/18  112/66  HR  58 9/17 7am 125/82  HR 79, 1pm  131/56 HR 74, 5pm 17/75 HR 55   9/16 7am 111/68 HR 60 ,1pm 115/57  HR 71, 5pm 143/108 HR 62 9/15 7am 113/85 HR 74   3pm  115/76  HR  64   no PM 9/14  8am 104/52  HR 79   12 pm  116/55   HR 63   7 pm  116/75  HR 55

## 2023-10-04 DIAGNOSIS — E559 Vitamin D deficiency, unspecified: Secondary | ICD-10-CM | POA: Diagnosis not present

## 2023-10-04 DIAGNOSIS — Z23 Encounter for immunization: Secondary | ICD-10-CM | POA: Diagnosis not present

## 2023-10-04 DIAGNOSIS — K589 Irritable bowel syndrome without diarrhea: Secondary | ICD-10-CM | POA: Diagnosis not present

## 2023-10-04 DIAGNOSIS — E538 Deficiency of other specified B group vitamins: Secondary | ICD-10-CM | POA: Diagnosis not present

## 2023-10-05 ENCOUNTER — Encounter: Payer: Self-pay | Admitting: Physician Assistant

## 2023-10-05 ENCOUNTER — Ambulatory Visit: Payer: Medicare Other | Admitting: Physician Assistant

## 2023-10-05 DIAGNOSIS — F99 Mental disorder, not otherwise specified: Secondary | ICD-10-CM

## 2023-10-05 DIAGNOSIS — F5105 Insomnia due to other mental disorder: Secondary | ICD-10-CM | POA: Diagnosis not present

## 2023-10-05 DIAGNOSIS — F419 Anxiety disorder, unspecified: Secondary | ICD-10-CM | POA: Diagnosis not present

## 2023-10-05 DIAGNOSIS — F3341 Major depressive disorder, recurrent, in partial remission: Secondary | ICD-10-CM

## 2023-10-05 DIAGNOSIS — F4310 Post-traumatic stress disorder, unspecified: Secondary | ICD-10-CM | POA: Diagnosis not present

## 2023-10-05 MED ORDER — TRAZODONE HCL 150 MG PO TABS: 225.0000 mg | ORAL_TABLET | Freq: Every evening | ORAL | 3 refills | Status: DC | PRN

## 2023-10-05 MED ORDER — LAMOTRIGINE 25 MG PO TABS: ORAL_TABLET | ORAL | 1 refills | Status: DC
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
## 2023-10-09 ENCOUNTER — Encounter: Payer: Self-pay | Admitting: Physician Assistant

## 2023-10-17 DIAGNOSIS — M79641 Pain in right hand: Secondary | ICD-10-CM | POA: Diagnosis not present

## 2023-10-17 DIAGNOSIS — G5601 Carpal tunnel syndrome, right upper limb: Secondary | ICD-10-CM | POA: Diagnosis not present

## 2023-10-27 ENCOUNTER — Other Ambulatory Visit: Payer: Self-pay | Admitting: Physician Assistant

## 2023-10-31 ENCOUNTER — Ambulatory Visit (HOSPITAL_BASED_OUTPATIENT_CLINIC_OR_DEPARTMENT_OTHER)
Admission: EM | Admit: 2023-10-31 | Discharge: 2023-10-31 | Disposition: A | Discharge status: Home / Self Care | Payer: Medicare Other | Attending: Internal Medicine | Admitting: Internal Medicine

## 2023-10-31 ENCOUNTER — Encounter (HOSPITAL_BASED_OUTPATIENT_CLINIC_OR_DEPARTMENT_OTHER): Payer: Self-pay

## 2023-10-31 DIAGNOSIS — K580 Irritable bowel syndrome with diarrhea: Secondary | ICD-10-CM | POA: Diagnosis not present

## 2023-10-31 DIAGNOSIS — R1084 Generalized abdominal pain: Secondary | ICD-10-CM | POA: Insufficient documentation

## 2023-10-31 LAB — POCT URINALYSIS DIP (MANUAL ENTRY)
Bilirubin, UA: NEGATIVE
Blood, UA: NEGATIVE
Glucose, UA: NEGATIVE mg/dL
Ketones, POC UA: NEGATIVE mg/dL
Leukocytes, UA: NEGATIVE
Nitrite, UA: NEGATIVE
Spec Grav, UA: >=1.03 — AB (ref 1.010–1.025)
Urobilinogen, UA: 0.2 U/dL
pH, UA: 5.5 (ref 5.0–8.0)

## 2023-10-31 MED ORDER — KETOROLAC TROMETHAMINE 30 MG/ML IJ SOLN
30.0000 mg | Freq: Once | INTRAMUSCULAR | Status: AC
  Administered 2023-10-31: 30 mg via INTRAMUSCULAR

## 2023-10-31 MED ORDER — DICYCLOMINE HCL 20 MG PO TABS: 20.0000 mg | ORAL_TABLET | Freq: Two times a day (BID) | ORAL | 0 refills | Status: AC | PRN

## 2023-10-31 MED ORDER — ACETAMINOPHEN 325 MG PO TABS
975.0000 mg | ORAL_TABLET | Freq: Once | ORAL | Status: AC
  Administered 2023-10-31: 975 mg via ORAL
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
## 2023-10-31 NOTE — Discharge Instructions (Addendum)
Take bentyl every 12 hours as needed for abdominal spasm. We gave you an injection of ketorolac (similar to ibuprofen) and tylenol today.  Do not take ibuprofen for 24 hours since we gave you the antiinflammatory medicine here in the urgent care.  I suspect this discomfort is related to your IBS, however, if you symptoms do not get better in the next few days, call your primary care provider to schedule an appointment for follow-up.   If your symptoms worsen, or if you start having fevers, chills, nausea, vomiting, etc, please either return to urgent care or go to the ER for severe symptoms.  Feel better!

## 2023-10-31 NOTE — ED Triage Notes (Signed)
Pt c/o generalized abdominal pain since Saturday. States developed abdominal distention yesterday with tender to tough and diarrhea started this am. States took ibuprofen with no relief.

## 2023-11-02 ENCOUNTER — Ambulatory Visit: Payer: Medicare Other | Admitting: Physician Assistant

## 2023-11-02 ENCOUNTER — Encounter: Payer: Self-pay | Admitting: Physician Assistant

## 2023-11-02 DIAGNOSIS — K5732 Diverticulitis of large intestine without perforation or abscess without bleeding: Secondary | ICD-10-CM | POA: Diagnosis not present

## 2023-11-02 DIAGNOSIS — R1031 Right lower quadrant pain: Secondary | ICD-10-CM | POA: Diagnosis not present

## 2023-11-02 DIAGNOSIS — Z79899 Other long term (current) drug therapy: Secondary | ICD-10-CM

## 2023-11-02 DIAGNOSIS — F419 Anxiety disorder, unspecified: Secondary | ICD-10-CM

## 2023-11-02 DIAGNOSIS — F3341 Major depressive disorder, recurrent, in partial remission: Secondary | ICD-10-CM | POA: Diagnosis not present

## 2023-11-02 DIAGNOSIS — R5383 Other fatigue: Secondary | ICD-10-CM | POA: Diagnosis not present

## 2023-11-02 DIAGNOSIS — R5381 Other malaise: Secondary | ICD-10-CM

## 2023-11-02 DIAGNOSIS — F5105 Insomnia due to other mental disorder: Secondary | ICD-10-CM | POA: Diagnosis not present

## 2023-11-02 DIAGNOSIS — K5792 Diverticulitis of intestine, part unspecified, without perforation or abscess without bleeding: Secondary | ICD-10-CM | POA: Diagnosis not present

## 2023-11-02 DIAGNOSIS — F4310 Post-traumatic stress disorder, unspecified: Secondary | ICD-10-CM

## 2023-11-02 DIAGNOSIS — F99 Mental disorder, not otherwise specified: Secondary | ICD-10-CM
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
## 2023-11-03 LAB — CBC WITH DIFFERENTIAL/PLATELET
Basophils Absolute: 0 10*3/uL (ref 0.0–0.2)
Basos: 1 %
EOS (ABSOLUTE): 0.2 10*3/uL (ref 0.0–0.4)
Eos: 3 %
Hematocrit: 40.9 % (ref 34.0–46.6)
Hemoglobin: 12.9 g/dL (ref 11.1–15.9)
Immature Grans (Abs): 0 10*3/uL (ref 0.0–0.1)
Immature Granulocytes: 0 %
Lymphocytes Absolute: 0.9 10*3/uL (ref 0.7–3.1)
Lymphs: 20 %
MCH: 29.6 pg (ref 26.6–33.0)
MCHC: 31.5 g/dL (ref 31.5–35.7)
MCV: 94 fL (ref 79–97)
Monocytes Absolute: 0.4 10*3/uL (ref 0.1–0.9)
Monocytes: 9 %
Neutrophils Absolute: 3 10*3/uL (ref 1.4–7.0)
Neutrophils: 67 %
Platelets: 321 10*3/uL (ref 150–450)
RBC: 4.36 x10E6/uL (ref 3.77–5.28)
RDW: 11.6 % — ABNORMAL LOW (ref 11.7–15.4)
WBC: 4.5 10*3/uL (ref 3.4–10.8)

## 2023-11-03 LAB — COMPREHENSIVE METABOLIC PANEL
ALT: 25 [IU]/L (ref 0–32)
AST: 27 [IU]/L (ref 0–40)
Albumin: 3.9 g/dL (ref 3.8–4.9)
Alkaline Phosphatase: 262 [IU]/L — ABNORMAL HIGH (ref 44–121)
BUN/Creatinine Ratio: 18 (ref 9–23)
BUN: 14 mg/dL (ref 6–24)
Bilirubin Total: 0.3 mg/dL (ref 0.0–1.2)
CO2: 25 mmol/L (ref 20–29)
Calcium: 9.4 mg/dL (ref 8.7–10.2)
Chloride: 103 mmol/L (ref 96–106)
Creatinine, Ser: 0.77 mg/dL (ref 0.57–1.00)
Globulin, Total: 2.3 g/dL (ref 1.5–4.5)
Glucose: 78 mg/dL (ref 70–99)
Potassium: 4.6 mmol/L (ref 3.5–5.2)
Sodium: 142 mmol/L (ref 134–144)
Total Protein: 6.2 g/dL (ref 6.0–8.5)
eGFR: 93 mL/min/{1.73_m2} (ref >59)

## 2023-11-03 LAB — HEMOGLOBIN A1C
Est. average glucose Bld gHb Est-mCnc: 103 mg/dL
Hgb A1c MFr Bld: 5.2 % (ref 4.8–5.6)

## 2023-11-03 LAB — TSH: TSH: 0.892 u[IU]/mL (ref 0.450–4.500)

## 2023-11-03 NOTE — Progress Notes (Signed)
Raven, please let her know one of the liver tests was elevated. I'm not sure how significant that is because the other liver test were completely normal.  I want her to see her PCP about this abnormality, and the abdominal pain as soon as she can get in.  All other lab results were normal.  Thank you.

## 2023-11-03 NOTE — Progress Notes (Signed)
Noted  

## 2023-11-28 DIAGNOSIS — Z136 Encounter for screening for cardiovascular disorders: Secondary | ICD-10-CM | POA: Diagnosis not present

## 2023-11-28 DIAGNOSIS — Z79899 Other long term (current) drug therapy: Secondary | ICD-10-CM | POA: Diagnosis not present

## 2023-11-28 DIAGNOSIS — Z0000 Encounter for general adult medical examination without abnormal findings: Secondary | ICD-10-CM | POA: Diagnosis not present

## 2023-11-28 DIAGNOSIS — K130 Diseases of lips: Secondary | ICD-10-CM | POA: Diagnosis not present

## 2023-12-05 DIAGNOSIS — G5601 Carpal tunnel syndrome, right upper limb: Secondary | ICD-10-CM | POA: Diagnosis not present

## 2023-12-05 DIAGNOSIS — M1811 Unilateral primary osteoarthritis of first carpometacarpal joint, right hand: Secondary | ICD-10-CM | POA: Diagnosis not present

## 2023-12-23 ENCOUNTER — Encounter: Payer: Self-pay | Admitting: Hematology and Oncology

## 2023-12-23 ENCOUNTER — Inpatient Hospital Stay: Payer: Medicare Other

## 2023-12-23 ENCOUNTER — Inpatient Hospital Stay: Payer: Medicare Other | Attending: Hematology and Oncology | Admitting: Hematology and Oncology

## 2023-12-23 ENCOUNTER — Telehealth: Payer: Self-pay

## 2023-12-23 VITALS — BP 121/62 | HR 70 | Temp 97.9°F | Resp 18 | Ht 63.0 in | Wt 167.4 lb

## 2023-12-23 DIAGNOSIS — E538 Deficiency of other specified B group vitamins: Secondary | ICD-10-CM | POA: Diagnosis not present

## 2023-12-23 DIAGNOSIS — M79605 Pain in left leg: Secondary | ICD-10-CM

## 2023-12-23 DIAGNOSIS — E611 Iron deficiency: Secondary | ICD-10-CM | POA: Diagnosis not present

## 2023-12-23 DIAGNOSIS — D509 Iron deficiency anemia, unspecified: Secondary | ICD-10-CM

## 2023-12-23 LAB — CBC (CANCER CENTER ONLY)
HCT: 39.7 % (ref 36.0–46.0)
Hemoglobin: 13.2 g/dL (ref 12.0–15.0)
MCH: 30.5 pg (ref 26.0–34.0)
MCHC: 33.2 g/dL (ref 30.0–36.0)
MCV: 91.7 fL (ref 80.0–100.0)
Platelet Count: 258 10*3/uL (ref 150–400)
RBC: 4.33 MIL/uL (ref 3.87–5.11)
RDW: 12.1 % (ref 11.5–15.5)
WBC Count: 3.9 10*3/uL — ABNORMAL LOW (ref 4.0–10.5)
nRBC: 0 % (ref 0.0–0.2)

## 2023-12-23 LAB — CMP (CANCER CENTER ONLY)
ALT: 16 U/L (ref 0–44)
AST: 24 U/L (ref 15–41)
Albumin: 4.3 g/dL (ref 3.5–5.0)
Alkaline Phosphatase: 124 U/L (ref 38–126)
Anion gap: 9 (ref 5–15)
BUN: 13 mg/dL (ref 6–20)
CO2: 26 mmol/L (ref 22–32)
Calcium: 9.3 mg/dL (ref 8.9–10.3)
Chloride: 108 mmol/L (ref 98–111)
Creatinine: 0.9 mg/dL (ref 0.44–1.00)
GFR, Estimated: >60 mL/min (ref >60)
Glucose, Bld: 91 mg/dL (ref 70–99)
Potassium: 3.9 mmol/L (ref 3.5–5.1)
Sodium: 143 mmol/L (ref 135–145)
Total Bilirubin: 0.3 mg/dL (ref <1.2)
Total Protein: 6.5 g/dL (ref 6.5–8.1)

## 2023-12-23 LAB — FERRITIN: Ferritin: 31 ng/mL (ref 11–307)

## 2023-12-23 LAB — IRON AND TIBC
Iron: 111 ug/dL (ref 28–170)
Saturation Ratios: 32 % — ABNORMAL HIGH (ref 10.4–31.8)
TIBC: 353 ug/dL (ref 250–450)
UIBC: 242 ug/dL

## 2023-12-23 LAB — FOLATE: Folate: >40 ng/mL (ref >5.9)

## 2023-12-23 LAB — VITAMIN B12: Vitamin B-12: 704 pg/mL (ref 180–914)

## 2023-12-23 NOTE — Telephone Encounter (Signed)
I spoke with Melissa,NP. She hasn't received results as of yet. She will call [facility] and return call to pt. Pt number is [phone]

## 2023-12-23 NOTE — Progress Notes (Cosign Needed Addendum)
[facility]  [address] [place],  [place]  [address] [phone]  Clinic Day:  12/23/2023  Referring physician: Mikki Santee Key, *   HISTORY OF PRESENT ILLNESS:  The patient is a 52 y.o. female with a history of iron deficiency felt to be most likely related to heavy menses, who we began seeing in April 2023.  Of note, she also has had a gastric sleeve procedure. She was unable to tolerate oral iron supplement, so was treated with IV Feraheme in April 2023 with normalization of her hemoglobin and iron stores.  She has not required retreatment with IV iron.  She was also found to have B12 deficiency and continues B12 250 mcg subcutaneously weekly.  This is being done to avoid significant interaction with her risperidone.  EGD and colonoscopy in December did not reveal any site of recent or active bleeding.  3 gastric erosions were seen and post surgical changes from. She had removal of 3 polyps, 1 hyperplastic polyp from the cecum into tubular adenomas from the transverse colon.  Follow-up colonoscopy in 3 years was recommended.  Her bariatric surgeon recommended a bariatric multivitamin, as well as calcium.    She is here today for routine follow-up and continues to report fatigue not relieved at rest. She is having hot flashes as well and attributes this to menopause.  She has noted some mild shortness of breath with exertion.  She is now taking a bariatric multivitamin without iron.    Due to her family history of malignancy, she underwent testing for hereditary cancer syndromes with [facility] CancerNext-Expanded +RNAinsight Panel.  This did not reveal any clinically significant mutation or variants of uncertain significance.   Incidentally, she mentions swelling and pain to her lower left extremity. We will send for [place] today to assess for DVT.     PHYSICAL EXAM:  Blood pressure 121/62, pulse 70, temperature 97.9 F (36.6 C), temperature source Oral,  resp. rate 18, height 5\' 3"  (1.6 m), weight 167 lb 6.4 oz (75.9 kg), SpO2 99%. Wt Readings from Last 3 Encounters:  12/23/23 167 lb 6.4 oz (75.9 kg)  09/01/23 167 lb 3.2 oz (75.8 kg)  08/04/23 166 lb (75.3 kg)   Body mass index is 29.65 kg/m.  Performance status (ECOG): 1 - Symptomatic but completely ambulatory  Physical Exam Vitals and nursing note reviewed.  Constitutional:      General: She is not in acute distress.    Appearance: Normal appearance.  HENT:     Head: Normocephalic and atraumatic.     Mouth/Throat:     Mouth: Mucous membranes are moist.     Pharynx: Oropharynx is clear. No oropharyngeal exudate or posterior oropharyngeal erythema.  Eyes:     General: No scleral icterus.    Extraocular Movements: Extraocular movements intact.     Conjunctiva/sclera: Conjunctivae normal.     Pupils: Pupils are equal, round, and reactive to light.  Cardiovascular:     Rate and Rhythm: Normal rate and regular rhythm.     Heart sounds: Normal heart sounds. No murmur heard.    No friction rub. No gallop.  Pulmonary:     Effort: Pulmonary effort is normal.     Breath sounds: Normal breath sounds. No wheezing, rhonchi or rales.  Abdominal:     General: There is no distension.     Palpations: Abdomen is soft. There is no mass.     Tenderness: There is no abdominal tenderness.  Musculoskeletal:  General: Normal range of motion.     Cervical back: Normal range of motion and neck supple. No tenderness.     Right lower leg: No edema.     Left lower leg: No edema.  Lymphadenopathy:     Cervical: No cervical adenopathy.  Skin:    General: Skin is warm and dry.     Coloration: Skin is not jaundiced.     Findings: No rash.  Neurological:     Mental Status: She is alert and oriented to person, place, and time.     Cranial Nerves: No cranial nerve deficit.  Psychiatric:        Mood and Affect: Mood normal.        Behavior: Behavior normal.        Thought Content: Thought  content normal.     LABS:      Latest Ref Rng & Units 12/23/2023    8:22 AM 11/02/2023   11:04 AM 06/23/2023    8:22 AM  CBC  WBC 4.0 - 10.5 K/uL 3.9  4.5  3.5   Hemoglobin 12.0 - 15.0 g/dL 40.9  81.1  91.4   Hematocrit 36.0 - 46.0 % 39.7  40.9  40.9   Platelets 150 - 400 K/uL 258  321  230       Latest Ref Rng & Units 12/23/2023    8:22 AM 11/02/2023   11:04 AM 08/15/2023    4:20 PM  CMP  Glucose 70 - 99 mg/dL 91  78  84   BUN 6 - 20 mg/dL 13  14  11    Creatinine 0.44 - 1.00 mg/dL 7.82  9.56  2.13   Sodium 135 - 145 mmol/L 143  142  144   Potassium 3.5 - 5.1 mmol/L 3.9  4.6  4.5   Chloride 98 - 111 mmol/L 108  103  107   CO2 22 - 32 mmol/L 26  25  25    Calcium 8.9 - 10.3 mg/dL 9.3  9.4  9.4   Total Protein 6.5 - 8.1 g/dL 6.5  6.2    Total Bilirubin <1.2 mg/dL 0.3  0.3    Alkaline Phos 38 - 126 U/L 124  262    AST 15 - 41 U/L 24  27    ALT 0 - 44 U/L 16  25       No results found for: "CEA1", "CEA" / No results found for: "CEA1", "CEA" No results found for: "PSA1" No results found for: "YQM578" No results found for: "CAN125"  No results found for: "TOTALPROTELP", "ALBUMINELP", "A1GS", "A2GS", "BETS", "BETA2SER", "GAMS", "MSPIKE", "SPEI" Lab Results  Component Value Date   TIBC 314 06/23/2023   TIBC 276 02/24/2023   TIBC 305 11/24/2022   FERRITIN 34 06/23/2023   FERRITIN 34 02/24/2023   FERRITIN 39 11/24/2022   IRONPCTSAT 39 (H) 06/23/2023   IRONPCTSAT 36 (H) 02/24/2023   IRONPCTSAT 37 (H) 11/24/2022   Lab Results  Component Value Date   LDH 94 (L) 03/30/2016       Component Value Date/Time   LDH 94 (L) 03/30/2016 1830    Review Flowsheet  More data exists      Latest Ref Rng & Units 11/24/2022 02/24/2023 06/23/2023  Oncology Labs  Ferritin 11 - 307 ng/mL 39  34  34   %SAT 10.4 - 31.8 % 37  36  39      STUDIES:  No results found.    ASSESSMENT & PLAN:   Assessment/Plan:  52 y.o. female with both iron and B12 deficiency.  Both have been  repleted and she has not had recurrent iron deficiency.  B12 and folate are normal.  She has mild intermittent leukopenia back to October 2022.  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  We will plan to continue follow-up and see her back in 6 months for repeat clinical assessment. Ultrasound of left lower extremity today was negative.  The patient understands all the plans discussed today and is in agreement with them.  She knows to contact our office if she develops concerns prior to her next appointment.     Pascal Lux, NP   Family Nurse Practitioner - Board Certified [facility] [phone]

## 2024-01-03 ENCOUNTER — Other Ambulatory Visit: Payer: Self-pay | Admitting: Oncology

## 2024-01-03 DIAGNOSIS — D509 Iron deficiency anemia, unspecified: Secondary | ICD-10-CM

## 2024-01-04 DIAGNOSIS — R232 Flushing: Secondary | ICD-10-CM | POA: Diagnosis not present

## 2024-01-09 ENCOUNTER — Telehealth: Payer: Self-pay | Admitting: Nurse Practitioner

## 2024-01-09 ENCOUNTER — Encounter: Payer: Self-pay | Admitting: Hematology and Oncology

## 2024-01-09 NOTE — Telephone Encounter (Signed)
 Called patient in regards to her diverticulitis fare up. Patient's main complaint is that she is in pain. Patient also states the pain is at a 9-10 pain level. Informed the patient that she needs to go to the urgent care or the ED. Patient wants to be seen today via the GI office. Informed the patient there are no opened appts and no available slots. Again instructed the patient to go to the urgent care or to the ED. Patient agreed.

## 2024-01-09 NOTE — Telephone Encounter (Signed)
 Patient called and stated she is in a lot of pain and is wishing to speak to a nurse about her symptoms. Patient is requesting a call back. Please advise.

## 2024-01-10 DIAGNOSIS — Z743 Need for continuous supervision: Secondary | ICD-10-CM | POA: Diagnosis not present

## 2024-01-10 DIAGNOSIS — R079 Chest pain, unspecified: Secondary | ICD-10-CM | POA: Diagnosis not present

## 2024-01-10 DIAGNOSIS — I10 Essential (primary) hypertension: Secondary | ICD-10-CM | POA: Diagnosis not present

## 2024-01-10 DIAGNOSIS — Z86711 Personal history of pulmonary embolism: Secondary | ICD-10-CM | POA: Diagnosis not present

## 2024-01-10 DIAGNOSIS — R1084 Generalized abdominal pain: Secondary | ICD-10-CM | POA: Diagnosis not present

## 2024-01-10 DIAGNOSIS — K5792 Diverticulitis of intestine, part unspecified, without perforation or abscess without bleeding: Secondary | ICD-10-CM | POA: Diagnosis not present

## 2024-01-10 DIAGNOSIS — R109 Unspecified abdominal pain: Secondary | ICD-10-CM | POA: Diagnosis not present

## 2024-01-10 DIAGNOSIS — N201 Calculus of ureter: Secondary | ICD-10-CM | POA: Diagnosis not present

## 2024-01-10 DIAGNOSIS — N132 Hydronephrosis with renal and ureteral calculous obstruction: Secondary | ICD-10-CM | POA: Diagnosis not present

## 2024-01-10 DIAGNOSIS — K5732 Diverticulitis of large intestine without perforation or abscess without bleeding: Secondary | ICD-10-CM | POA: Diagnosis not present

## 2024-01-10 DIAGNOSIS — R300 Dysuria: Secondary | ICD-10-CM | POA: Diagnosis not present

## 2024-01-10 LAB — LAB REPORT - SCANNED
Calcium: 9.3
EGFR: 89
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
## 2024-01-17 NOTE — Telephone Encounter (Signed)
Records from [facility] received, placing in [place] office IN box. Per notes patient was prescribed Levaquin and Flagyl due to allergy.

## 2024-01-17 NOTE — Telephone Encounter (Signed)
Returned call to patient. Pt reports that she saw her PCP last week for diverticulitis flare up, PCP drew labs but ended up sending patient via EMS to [facility]. Pt reports that CT scan was completed while at the hospital, diagnosed with diverticulitis and treated with Cipro/Flagyl. Pt completed treatment yesterday. Pain is tolerable at this time, abdomen is not as tender to the touch, but now she is passing "clear fluid and blood clots". Pt reports that she has a loose stools this morning, at the end of the stool there was a dark red, blood clot. Since then patient has been passing some bright red blood with no stool. Pt does report fatigue. She has been following a bland diet. Pt has been scheduled for a visit with Deanna May, NP tomorrow at 11:30 am. Pt is aware that I will contact [facility] and her PCP for records so that notes can be reviewed prior to her appt. Pt is aware that if she notices increased bleeding, chest pain, or shortness of breath prior to her appt she will need to return to ED. Pt verbalized understanding and had no concerns at the end of the call.   [facility] [phone]). Left a detailed vm for medical records department requesting that they send over notes from hospital visit last week to include notes, labs, and imaging. I informed them that patient does have an office visit tomorrow. I provided them with my direct office number if they have any questions. I have also faxed over an urgent request for records. Called Dr. Waverly Ferrari office [phone]) as well requesting office notes and lab results from last week to be faxed. Urgent request faxed as well.

## 2024-01-17 NOTE — Telephone Encounter (Signed)
Patient called stated she was seen last week at the Ed for diverticulitis symptoms, she is now experiencing some fluid leakage and blood. Please advise.

## 2024-01-18 ENCOUNTER — Other Ambulatory Visit: Payer: Medicare Other

## 2024-01-18 ENCOUNTER — Ambulatory Visit: Payer: Medicare Other | Admitting: Gastroenterology

## 2024-01-18 ENCOUNTER — Encounter: Payer: Self-pay | Admitting: Gastroenterology

## 2024-01-18 VITALS — BP 124/76 | HR 69 | Ht 63.0 in | Wt 166.5 lb

## 2024-01-18 DIAGNOSIS — R748 Abnormal levels of other serum enzymes: Secondary | ICD-10-CM | POA: Diagnosis not present

## 2024-01-18 DIAGNOSIS — K5732 Diverticulitis of large intestine without perforation or abscess without bleeding: Secondary | ICD-10-CM

## 2024-01-18 DIAGNOSIS — R110 Nausea: Secondary | ICD-10-CM

## 2024-01-18 DIAGNOSIS — K625 Hemorrhage of anus and rectum: Secondary | ICD-10-CM | POA: Diagnosis not present

## 2024-01-18 DIAGNOSIS — R1032 Left lower quadrant pain: Secondary | ICD-10-CM

## 2024-01-18 DIAGNOSIS — K921 Melena: Secondary | ICD-10-CM

## 2024-01-18 LAB — CBC WITH DIFFERENTIAL/PLATELET
Basophils Absolute: 0.1 10*3/uL (ref 0.0–0.1)
Basophils Relative: 0.8 % (ref 0.0–3.0)
Eosinophils Absolute: 0.1 10*3/uL (ref 0.0–0.7)
Eosinophils Relative: 2.1 % (ref 0.0–5.0)
HCT: 42.3 % (ref 36.0–46.0)
Hemoglobin: 14.2 g/dL (ref 12.0–15.0)
Lymphocytes Relative: 18.4 % (ref 12.0–46.0)
Lymphs Abs: 1.2 10*3/uL (ref 0.7–4.0)
MCHC: 33.5 g/dL (ref 30.0–36.0)
MCV: 90.1 fL (ref 78.0–100.0)
Monocytes Absolute: 0.5 10*3/uL (ref 0.1–1.0)
Monocytes Relative: 7.8 % (ref 3.0–12.0)
Neutro Abs: 4.5 10*3/uL (ref 1.4–7.7)
Neutrophils Relative %: 70.9 % (ref 43.0–77.0)
Platelets: 321 10*3/uL (ref 150.0–400.0)
RBC: 4.69 Mil/uL (ref 3.87–5.11)
RDW: 12.9 % (ref 11.5–15.5)
WBC: 6.3 10*3/uL (ref 4.0–10.5)

## 2024-01-18 LAB — COMPREHENSIVE METABOLIC PANEL
ALT: 17 U/L (ref 0–35)
AST: 21 U/L (ref 0–37)
Albumin: 4.4 g/dL (ref 3.5–5.2)
Alkaline Phosphatase: 102 U/L (ref 39–117)
BUN: 10 mg/dL (ref 6–23)
CO2: 30 meq/L (ref 19–32)
Calcium: 9.2 mg/dL (ref 8.4–10.5)
Chloride: 104 meq/L (ref 96–112)
Creatinine, Ser: 0.82 mg/dL (ref 0.40–1.20)
GFR: 81.93 mL/min (ref >60.00)
Glucose, Bld: 91 mg/dL (ref 70–99)
Potassium: 4.3 meq/L (ref 3.5–5.1)
Sodium: 140 meq/L (ref 135–145)
Total Bilirubin: 0.5 mg/dL (ref 0.2–1.2)
Total Protein: 6.9 g/dL (ref 6.0–8.3)

## 2024-01-18 NOTE — Patient Instructions (Addendum)
Your provider has requested that you go to the basement level for lab work before leaving today. Press "B" on the elevator. The lab is located at the first door on the left as you exit the elevator.  Due to recent changes in healthcare laws, you may see the results of your imaging and laboratory studies on MyChart before your provider has had a chance to review them.  We understand that in some cases there may be results that are confusing or concerning to you. Not all laboratory results come back in the same time frame and the provider may be waiting for multiple results in order to interpret others.  Please give [place] 48 hours in order for your provider to thoroughly review all the results before contacting the office for clarification of your results.   If you  have recurrent rectal bleeding please proceed to ER.  _______________________________________________________  If your blood pressure at your visit was 140/90 or greater, please contact your primary care physician to follow up on this.  _______________________________________________________  If you are age 24 or older, your body mass index should be between 23-30. Your Body mass index is 29.49 kg/m. If this is out of the aforementioned range listed, please consider follow up with your Primary Care Provider.  If you are age 36 or younger, your body mass index should be between 19-25. Your Body mass index is 29.49 kg/m. If this is out of the aformentioned range listed, please consider follow up with your Primary Care Provider.   ________________________________________________________  The [facility] providers would like to encourage you to use [facility] to communicate with providers for non-urgent requests or questions.  Due to long hold times on the telephone, sending your provider a message by [facility] may be a faster and more efficient way to get a response.  Please allow 48 business hours for a response.  Please remember that this is for  non-urgent requests.  _______________________________________________________  Thank you for choosing me and [facility].  Deanna May, NP

## 2024-01-26 NOTE — Progress Notes (Signed)
Agree with assessment/plan.  Nl CBC, CMP Reviewed recent negative colonoscopy 2023 with good prep.  Few small polyps and diverticulosis Patient likely had a diverticular bleed  Deanna, can you please call her and see how she is doing. If there is any further bleeding, re check CBC and let me know.  Edman Circle, MD [facility] [phone]

## 2024-01-27 NOTE — Progress Notes (Signed)
Awesome Thx RG

## 2024-02-07 ENCOUNTER — Ambulatory Visit: Payer: Medicare Other | Admitting: Physician Assistant

## 2024-02-22 DIAGNOSIS — M79672 Pain in left foot: Secondary | ICD-10-CM | POA: Diagnosis not present

## 2024-02-28 DIAGNOSIS — R030 Elevated blood-pressure reading, without diagnosis of hypertension: Secondary | ICD-10-CM | POA: Diagnosis not present

## 2024-02-28 DIAGNOSIS — R232 Flushing: Secondary | ICD-10-CM | POA: Diagnosis not present

## 2024-02-28 DIAGNOSIS — R7401 Elevation of levels of liver transaminase levels: Secondary | ICD-10-CM | POA: Diagnosis not present

## 2024-03-01 DIAGNOSIS — I10 Essential (primary) hypertension: Secondary | ICD-10-CM | POA: Diagnosis not present

## 2024-03-01 DIAGNOSIS — G5601 Carpal tunnel syndrome, right upper limb: Secondary | ICD-10-CM | POA: Diagnosis not present

## 2024-03-01 DIAGNOSIS — G43919 Migraine, unspecified, intractable, without status migrainosus: Secondary | ICD-10-CM | POA: Diagnosis not present

## 2024-03-01 DIAGNOSIS — R5383 Other fatigue: Secondary | ICD-10-CM | POA: Diagnosis not present

## 2024-03-06 DIAGNOSIS — R030 Elevated blood-pressure reading, without diagnosis of hypertension: Secondary | ICD-10-CM | POA: Diagnosis not present

## 2024-03-08 DIAGNOSIS — M79672 Pain in left foot: Secondary | ICD-10-CM | POA: Diagnosis not present

## 2024-03-15 ENCOUNTER — Ambulatory Visit: Payer: Medicare Other | Admitting: Physician Assistant

## 2024-03-15 DIAGNOSIS — I10 Essential (primary) hypertension: Secondary | ICD-10-CM | POA: Diagnosis not present

## 2024-03-19 DIAGNOSIS — R070 Pain in throat: Secondary | ICD-10-CM | POA: Diagnosis not present

## 2024-03-19 DIAGNOSIS — R519 Headache, unspecified: Secondary | ICD-10-CM | POA: Diagnosis not present

## 2024-03-22 DIAGNOSIS — M79672 Pain in left foot: Secondary | ICD-10-CM | POA: Diagnosis not present

## 2024-03-22 DIAGNOSIS — R600 Localized edema: Secondary | ICD-10-CM | POA: Diagnosis not present

## 2024-03-28 DIAGNOSIS — M79672 Pain in left foot: Secondary | ICD-10-CM | POA: Diagnosis not present

## 2024-03-29 ENCOUNTER — Encounter: Payer: Self-pay | Admitting: Physician Assistant

## 2024-03-29 ENCOUNTER — Ambulatory Visit: Admitting: Physician Assistant

## 2024-03-29 DIAGNOSIS — F419 Anxiety disorder, unspecified: Secondary | ICD-10-CM | POA: Diagnosis not present

## 2024-03-29 DIAGNOSIS — F5105 Insomnia due to other mental disorder: Secondary | ICD-10-CM | POA: Diagnosis not present

## 2024-03-29 DIAGNOSIS — F3341 Major depressive disorder, recurrent, in partial remission: Secondary | ICD-10-CM | POA: Diagnosis not present

## 2024-03-29 DIAGNOSIS — F99 Mental disorder, not otherwise specified: Secondary | ICD-10-CM

## 2024-03-29 MED ORDER — BUSPIRONE HCL 15 MG PO TABS: 15.0000 mg | ORAL_TABLET | Freq: Four times a day (QID) | ORAL | 1 refills | Status: DC

## 2024-03-29 MED ORDER — LORAZEPAM 1 MG PO TABS: 0.5000 mg | ORAL_TABLET | Freq: Every evening | ORAL | 1 refills | Status: DC | PRN
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
## 2024-04-12 ENCOUNTER — Telehealth: Payer: Self-pay | Admitting: Cardiovascular Disease

## 2024-04-12 NOTE — Progress Notes (Signed)
 Cardiology Office Note:  .   Date:  04/13/2024  ID:  [name] [name], DOB 1971/05/30, MRN [id number] PCP: Clemmie Nest, MD  [facility] Providers Cardiologist:  None {   History of Present Illness: .    Chief Complaint  Patient presents with   Follow-up         [name] [name] is a 53 y.o. female with history of HTN who presents for follow-up.    History of Present Illness   [name] [name] is a 53 year old female with hypertension who presents for follow-up due to elevated blood pressure and associated symptoms.  Her blood pressure has been elevated since around April 10th, leading to a visit to urgent care on April 11th where she was started on Vasotec. Despite this intervention, her blood pressure continues to rise, particularly during episodes of severe headaches and increased urination. These symptoms often coincide with elevated blood pressure readings.  She has been taking Vasotec, but it has not effectively controlled her blood pressure, even after increasing the dose to four times a day. She has a history of using losartan , which caused a rash, and amlodipine , which was discontinued due to low blood pressure and heart rate. Currently, she is not on any other antihypertensive medications.  She describes a pattern of stable blood pressure for weeks followed by sudden spikes. She is currently in menopause, which she believes may be affecting her blood pressure. No significant lifestyle or stress changes that could account for these fluctuations.  She reports swelling and burning sensations in her knees and thighs, attributing these to fluid retention. Elevating her feet at night provides some relief. No trouble breathing but she feels very tired and has headaches that affect her concentration.  She does not smoke or consume alcohol. She is sensitive to medications and has experienced blotchy red skin, which she describes as not a rash but noticeable on her arms  and knees.  Recent lab work in March showed normal kidney function, but she is unsure if her thyroid  was checked. She completed menopause a year ago in March, which she notes has affected her overall health.          Problem List HTN    ROS: All other ROS reviewed and negative. Pertinent positives noted in the HPI.     Studies Reviewed: SABRA       TTE 04/07/2023  1. Left ventricular ejection fraction, by estimation, is 60 to 65%. The  left ventricle has normal function. The left ventricle has no regional  wall motion abnormalities. There is mild concentric left ventricular  hypertrophy. Left ventricular diastolic  parameters are indeterminate. The average left ventricular global  longitudinal strain is 21.6 %. The global longitudinal strain is normal.   2. Right ventricular systolic function is normal. The right ventricular  size is normal. Tricuspid regurgitation signal is inadequate for assessing  PA pressure.   3. No evidence of mitral valve regurgitation.   4. The aortic valve is tricuspid. Aortic valve regurgitation is not  visualized.  Physical Exam:   VS:  BP (!) 162/90   Pulse 88   Ht 5' 3 (1.6 m)   Wt 177 lb (80.3 kg)   SpO2 98%   BMI 31.35 kg/m    Wt Readings from Last 3 Encounters:  04/13/24 177 lb (80.3 kg)  01/18/24 166 lb 8 oz (75.5 kg)  12/23/23 167 lb 6.4 oz (75.9 kg)    GEN: Well nourished, well developed  in no acute distress NECK: No JVD; No carotid bruits CARDIAC: RRR, no murmurs, rubs, gallops RESPIRATORY:  Clear to auscultation without rales, wheezing or rhonchi  ABDOMEN: Soft, non-tender, non-distended EXTREMITIES:  No edema; No deformity  ASSESSMENT AND PLAN: .   Assessment and Plan    Hypertension Hypertension with recent elevated blood pressure episodes. Vasotec ineffective, rash with olmesartan , hypotension with higher amlodipine  doses. Menopause may contribute to fluctuations. - Discontinue Vasotec. - Initiate amlodipine  5 mg daily. -  Check TSH levels. - Follow up in 6 weeks for re-evaluation.  Bilateral knee swelling Bilateral knee swelling with burning and blotchy skin. No fluid overload or heart failure. Possible vascular etiology. - Discuss with primary care provider for further evaluation.              Follow-up: Return in about 6 weeks (around 05/25/2024).   Signed, Darryle DASEN. Barbaraann, MD, [facility]  [facility]  [address], [address] [place], [place] [address] [phone]  2:50 PM

## 2024-04-12 NOTE — Telephone Encounter (Signed)
 Spoke to patient advised to continue same medications.Dr.O'Neal will discuss at appointment tomorrow 4/18 at 2:20 pm.Advised to bring B/P readings and a list of all medications to appointment.

## 2024-04-12 NOTE — Telephone Encounter (Signed)
 Pt c/o BP issue: STAT if pt c/o blurred vision, one-sided weakness or slurred speech.  STAT if BP is GREATER than 180/120 TODAY.  STAT if BP is LESS than 90/60 and SYMPTOMATIC TODAY  1. What is your BP concern?   Patient has been having a bad headache for the last 4 days  2. Have you taken any BP medication today?  Not yet  3. What are your last 5 BP readings?  158/102 - last night 146/93 - this morning  4. Are you having any other symptoms (ex. Dizziness, headache, blurred vision, passed out)? Headache, dizziness and "hard to think"   Patient stated she has been having a really bad headache for the last 4 days and is concerned her BP has not been coming down.  Patient noted she took her blood pressure medication before she went to bed last night.

## 2024-04-12 NOTE — Telephone Encounter (Signed)
 Received a call from patient requesting to be seen today.Stated her B/P has been elevated all week.Ranging 146/93,158/102,125/88,166/99,167/107.Pulse 70's.Stated she has a bad headache.She takes Enalapril 2.5 mg twice a day.Stated yesterday she took three times a day.Appointment scheduled with Dr.O'Neal 4/18 at 2:20 pm.Advised I will send message to Dr.O'Neal for advice.

## 2024-04-13 ENCOUNTER — Ambulatory Visit: Attending: Cardiovascular Disease | Admitting: Cardiovascular Disease

## 2024-04-13 ENCOUNTER — Encounter: Payer: Self-pay | Admitting: Cardiovascular Disease

## 2024-04-13 VITALS — BP 162/90 | HR 88 | Ht 63.0 in | Wt 177.0 lb

## 2024-04-13 DIAGNOSIS — I10 Essential (primary) hypertension: Secondary | ICD-10-CM

## 2024-04-13 MED ORDER — AMLODIPINE BESYLATE 5 MG PO TABS: 5.0000 mg | ORAL_TABLET | Freq: Every day | ORAL | 3 refills | Status: DC

## 2024-04-13 NOTE — Patient Instructions (Addendum)
 Medication Instructions:  - START AMLODIPINE  5 MG BY MOUTH DAILY  - STOP enalapril (VASOTEC) 2.5 MG tablet    *If you need a refill on your cardiac medications before your next appointment, please call your pharmacy*   Lab Work: TSH    If you have labs (blood work) drawn today and your tests are completely normal, you will receive your results only by: MyChart Message (if you have MyChart) OR A paper copy in the mail If you have any lab test that is abnormal or we need to change your treatment, we will call you to review the results.   Testing/Procedures: NONE    Follow-Up: At [facility], you and your health needs are our priority.  As part of our continuing mission to provide you with exceptional heart care, we have created designated Provider Care Teams.  These Care Teams include your primary Cardiologist (physician) and Advanced Practice Providers (APPs -  Physician Assistants and Nurse Practitioners) who all work together to provide you with the care you need, when you need it.  We recommend signing up for the patient portal called "MyChart".  Sign up information is provided on this After Visit Summary.  MyChart is used to connect with patients for Virtual Visits (Telemedicine).  Patients are able to view lab/test results, encounter notes, upcoming appointments, etc.  Non-urgent messages can be sent to your provider as well.   To learn more about what you can do with MyChart, go to [email or web address].    Your next appointment:   6 week(s)  The format for your next appointment:   In Person  Provider:   Marlana Silvan, NP       Other Instructions

## 2024-04-14 LAB — TSH: TSH: 0.644 u[IU]/mL (ref 0.450–4.500)

## 2024-04-15 ENCOUNTER — Encounter: Payer: Self-pay | Admitting: Cardiovascular Disease

## 2024-04-16 DIAGNOSIS — M79672 Pain in left foot: Secondary | ICD-10-CM | POA: Diagnosis not present

## 2024-04-26 ENCOUNTER — Ambulatory Visit: Admitting: Physician Assistant

## 2024-05-01 ENCOUNTER — Inpatient Hospital Stay: Attending: Hematology and Oncology

## 2024-05-01 ENCOUNTER — Telehealth: Payer: Self-pay

## 2024-05-01 DIAGNOSIS — E538 Deficiency of other specified B group vitamins: Secondary | ICD-10-CM | POA: Insufficient documentation

## 2024-05-01 DIAGNOSIS — E611 Iron deficiency: Secondary | ICD-10-CM | POA: Diagnosis not present

## 2024-05-01 DIAGNOSIS — D509 Iron deficiency anemia, unspecified: Secondary | ICD-10-CM

## 2024-05-01 LAB — CBC WITH DIFFERENTIAL (CANCER CENTER ONLY)
Abs Immature Granulocytes: 0.02 10*3/uL (ref 0.00–0.07)
Basophils Absolute: 0.1 10*3/uL (ref 0.0–0.1)
Basophils Relative: 1 %
Eosinophils Absolute: 0.1 10*3/uL (ref 0.0–0.5)
Eosinophils Relative: 2 %
HCT: 41.7 % (ref 36.0–46.0)
Hemoglobin: 14.3 g/dL (ref 12.0–15.0)
Immature Granulocytes: 0 %
Lymphocytes Relative: 22 %
Lymphs Abs: 1.2 10*3/uL (ref 0.7–4.0)
MCH: 30.2 pg (ref 26.0–34.0)
MCHC: 34.3 g/dL (ref 30.0–36.0)
MCV: 88 fL (ref 80.0–100.0)
Monocytes Absolute: 0.4 10*3/uL (ref 0.1–1.0)
Monocytes Relative: 8 %
Neutro Abs: 3.6 10*3/uL (ref 1.7–7.7)
Neutrophils Relative %: 67 %
Platelet Count: 291 10*3/uL (ref 150–400)
RBC: 4.74 MIL/uL (ref 3.87–5.11)
RDW: 12.1 % (ref 11.5–15.5)
WBC Count: 5.4 10*3/uL (ref 4.0–10.5)
nRBC: 0 % (ref 0.0–0.2)
nRBC: 0 /100{WBCs}

## 2024-05-01 LAB — IRON AND TIBC
Iron: 116 ug/dL (ref 28–170)
Saturation Ratios: 29 % (ref 10.4–31.8)
TIBC: 395 ug/dL (ref 250–450)
UIBC: 279 ug/dL

## 2024-05-01 LAB — FERRITIN: Ferritin: 31 ng/mL (ref 11–307)

## 2024-05-01 LAB — VITAMIN B12: Vitamin B-12: 1081 pg/mL — ABNORMAL HIGH (ref 180–914)

## 2024-05-01 LAB — FOLATE: Folate: >40 ng/mL (ref >5.9)

## 2024-05-01 NOTE — Telephone Encounter (Signed)
Forwarded to scheduling

## 2024-05-01 NOTE — Telephone Encounter (Signed)
-----   Message from Alfonso Ike sent at 05/01/2024  8:54 AM EDT ----- Regarding: RE: Ferritin I can see her tomorrow. ----- Message ----- From: Shelbie Dess, RN Sent: 05/01/2024   8:41 AM EDT To: Alfonso Ike, PA-C Subject: Ferritin                                       Patient called concerned that her Ferritin has dropped.  She is feeling SHOB, increased fatigue and difficult breathing after eating.  She has follow up with you in June but would like to have labs at least checked.

## 2024-05-02 ENCOUNTER — Telehealth: Payer: Self-pay | Admitting: Cardiovascular Disease

## 2024-05-02 ENCOUNTER — Telehealth: Payer: Self-pay

## 2024-05-02 ENCOUNTER — Telehealth: Payer: Self-pay | Admitting: Hematology and Oncology

## 2024-05-02 ENCOUNTER — Inpatient Hospital Stay: Admitting: Hematology and Oncology

## 2024-05-02 ENCOUNTER — Other Ambulatory Visit

## 2024-05-02 ENCOUNTER — Ambulatory Visit: Admitting: Hematology and Oncology

## 2024-05-02 NOTE — Telephone Encounter (Signed)
 [name] [name] returned call to make us  aware that your office note is not visible on MyChart. She wanted to know if you could fax/send your note directly to her cardiologist?

## 2024-05-02 NOTE — Telephone Encounter (Signed)
 Returned call to patient who states for the last week she has had fatigue and SOB. Over the weekend she had very little energy to complete ADL's. With any exertion, she is short of breath. Reports her HR is elevated in the upper 90's and her baseline is the 70's. This is at rest, states "I've not done anything today at all, my HR shouldn't be up." She called hematologist because in the past it's always been that she needed an iron infusion. She went to their office and had labs done and all came back normal, so hematologist told her she thought it could be her heart and she should go to ED to get checked out. Reports BP at home is great. Denies any lower extremity swelling, but reports abdomen is swollen as of yesterday. She wears stretchy pants and they are now tight on her. Has had diminished appetite. Spoke in detail with patient in that it sounds like ascites and she should go to ED for abdominal CT to be done and then Paracentesis can be done if needed. Pt agrees to plan.

## 2024-05-02 NOTE — Telephone Encounter (Signed)
 New Message:      Patient said her Hematologist had to do lab work yesterday.   She said she just received a call from her Hematologist.. She was told to contact Dr Rolm Clos and ask him to please review her results in MyChart and to please advise the patient what she needs to do please

## 2024-05-02 NOTE — Telephone Encounter (Signed)
 Pt calling back wanting to know Dr Salem Crater  recommendations or if she needs to go to ED

## 2024-05-02 NOTE — Telephone Encounter (Signed)
 Telephoned patient, as hemoglobin and iron normal. I am concerned she is having shortness of breath. She states she started noticing significant fatigue about 5 days ago.  She states shortness of breath is worse when supine and after eating.  She also reports abdominal distention.  She sees Dr. Rolm Clos in cardiology.  She saw him in April for hypertension.  He discontinued Vasotec and started amlodipine  5 mg daily.  She states her blood pressure is still elevated at home.  I recommended she go to the emergency room for immediate evaluation.  She plans to have a friend drive her to [facility].

## 2024-05-03 NOTE — Telephone Encounter (Signed)
 Patient identification verified by 2 forms. Hilton Lucky, RN    Called and spoke to patient  Informed patient per Dr. Rolm Clos, recommends OV with APP/DOD  RN Offered 5/8 OV at 10:55 AM  Patient states:   -she is feeling much better today   -PCP d/c Effexor  Rx yesterday   -PCP started Flomax 0.4 MG yesterday   -breathing, heart rate and BP are much better today   -Today BP: 117/80 and HR: 80   -she would prefer to keep 6/3 OV at this time -she will come in for OV with Dr. Rolm Clos recommends   Informed patient message sent to Dr. Rolm Clos  Patient has no further questions at this time

## 2024-05-04 ENCOUNTER — Encounter: Payer: Self-pay | Admitting: Hematology and Oncology

## 2024-05-10 DIAGNOSIS — M1811 Unilateral primary osteoarthritis of first carpometacarpal joint, right hand: Secondary | ICD-10-CM | POA: Diagnosis not present

## 2024-05-10 DIAGNOSIS — G5601 Carpal tunnel syndrome, right upper limb: Secondary | ICD-10-CM | POA: Diagnosis not present

## 2024-05-12 ENCOUNTER — Other Ambulatory Visit: Payer: Self-pay | Admitting: Nurse Practitioner

## 2024-05-12 DIAGNOSIS — R140 Abdominal distension (gaseous): Secondary | ICD-10-CM

## 2024-05-12 DIAGNOSIS — K259 Gastric ulcer, unspecified as acute or chronic, without hemorrhage or perforation: Secondary | ICD-10-CM

## 2024-05-14 DIAGNOSIS — M79671 Pain in right foot: Secondary | ICD-10-CM | POA: Diagnosis not present

## 2024-05-14 DIAGNOSIS — M19072 Primary osteoarthritis, left ankle and foot: Secondary | ICD-10-CM | POA: Diagnosis not present

## 2024-05-14 DIAGNOSIS — M79672 Pain in left foot: Secondary | ICD-10-CM | POA: Diagnosis not present

## 2024-05-18 DIAGNOSIS — G5601 Carpal tunnel syndrome, right upper limb: Secondary | ICD-10-CM | POA: Diagnosis not present

## 2024-05-23 ENCOUNTER — Encounter: Payer: Self-pay | Admitting: Physician Assistant

## 2024-05-23 ENCOUNTER — Ambulatory Visit: Admitting: Physician Assistant

## 2024-05-23 DIAGNOSIS — F5105 Insomnia due to other mental disorder: Secondary | ICD-10-CM | POA: Diagnosis not present

## 2024-05-23 DIAGNOSIS — F250 Schizoaffective disorder, bipolar type: Secondary | ICD-10-CM | POA: Diagnosis not present

## 2024-05-23 DIAGNOSIS — F4310 Post-traumatic stress disorder, unspecified: Secondary | ICD-10-CM | POA: Diagnosis not present

## 2024-05-23 DIAGNOSIS — F411 Generalized anxiety disorder: Secondary | ICD-10-CM | POA: Diagnosis not present

## 2024-05-23 DIAGNOSIS — F99 Mental disorder, not otherwise specified: Secondary | ICD-10-CM

## 2024-05-23 MED ORDER — LORAZEPAM 1 MG PO TABS: 0.5000 mg | ORAL_TABLET | Freq: Every evening | ORAL | 1 refills | Status: DC | PRN
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
## 2024-05-28 ENCOUNTER — Ambulatory Visit: Admitting: Physician Assistant

## 2024-05-28 DIAGNOSIS — M65931 Unspecified synovitis and tenosynovitis, right forearm: Secondary | ICD-10-CM | POA: Diagnosis not present

## 2024-05-28 DIAGNOSIS — G5601 Carpal tunnel syndrome, right upper limb: Secondary | ICD-10-CM | POA: Diagnosis not present

## 2024-05-29 ENCOUNTER — Ambulatory Visit: Admitting: Nurse Practitioner

## 2024-06-04 ENCOUNTER — Ambulatory Visit: Admitting: Dermatology

## 2024-06-06 DIAGNOSIS — M79671 Pain in right foot: Secondary | ICD-10-CM | POA: Diagnosis not present

## 2024-06-08 ENCOUNTER — Other Ambulatory Visit: Payer: Self-pay | Admitting: Physician Assistant

## 2024-06-08 ENCOUNTER — Other Ambulatory Visit: Payer: Self-pay | Admitting: Hematology and Oncology

## 2024-06-08 DIAGNOSIS — E538 Deficiency of other specified B group vitamins: Secondary | ICD-10-CM

## 2024-06-12 ENCOUNTER — Encounter: Payer: Self-pay | Admitting: Hematology and Oncology

## 2024-06-15 DIAGNOSIS — M79672 Pain in left foot: Secondary | ICD-10-CM | POA: Diagnosis not present

## 2024-06-18 ENCOUNTER — Ambulatory Visit: Admitting: Physician Assistant

## 2024-06-18 ENCOUNTER — Encounter: Payer: Self-pay | Admitting: Physician Assistant

## 2024-06-18 VITALS — BP 132/74 | HR 75 | Ht 63.0 in | Wt 185.0 lb

## 2024-06-18 DIAGNOSIS — K5900 Constipation, unspecified: Secondary | ICD-10-CM

## 2024-06-18 DIAGNOSIS — R140 Abdominal distension (gaseous): Secondary | ICD-10-CM | POA: Diagnosis not present

## 2024-06-18 DIAGNOSIS — R1084 Generalized abdominal pain: Secondary | ICD-10-CM | POA: Diagnosis not present

## 2024-06-18 NOTE — Patient Instructions (Signed)
 Start Miralax  1 capful twice daily in 8 ounces of liquid.  _______________________________________________________  If your blood pressure at your visit was 140/90 or greater, please contact your primary care physician to follow up on this.  _______________________________________________________  If you are age 53 or older, your body mass index should be between 23-30. Your Body mass index is 32.77 kg/m. If this is out of the aforementioned range listed, please consider follow up with your Primary Care Provider.  If you are age 26 or younger, your body mass index should be between 19-25. Your Body mass index is 32.77 kg/m. If this is out of the aformentioned range listed, please consider follow up with your Primary Care Provider.   ________________________________________________________  The [facility] providers would like to encourage you to use MYCHART to communicate with providers for non-urgent requests or questions.  Due to long hold times on the telephone, sending your provider a message by [facility] may be a faster and more efficient way to get a response.  Please allow 48 business hours for a response.  Please remember that this is for non-urgent requests.  _______________________________________________________
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
## 2024-06-20 DIAGNOSIS — M79672 Pain in left foot: Secondary | ICD-10-CM | POA: Diagnosis not present

## 2024-06-22 ENCOUNTER — Telehealth: Payer: Self-pay | Admitting: Hematology and Oncology

## 2024-06-22 ENCOUNTER — Other Ambulatory Visit: Payer: Self-pay | Admitting: Hematology and Oncology

## 2024-06-22 ENCOUNTER — Inpatient Hospital Stay: Payer: Medicare Other | Attending: Hematology and Oncology | Admitting: Hematology and Oncology

## 2024-06-22 ENCOUNTER — Encounter: Payer: Self-pay | Admitting: Hematology and Oncology

## 2024-06-22 ENCOUNTER — Inpatient Hospital Stay: Payer: Medicare Other

## 2024-06-22 VITALS — BP 122/88 | HR 93 | Temp 98.6°F | Resp 20 | Ht 63.0 in | Wt 182.5 lb

## 2024-06-22 DIAGNOSIS — E538 Deficiency of other specified B group vitamins: Secondary | ICD-10-CM | POA: Diagnosis not present

## 2024-06-22 DIAGNOSIS — D500 Iron deficiency anemia secondary to blood loss (chronic): Secondary | ICD-10-CM

## 2024-06-22 DIAGNOSIS — D508 Other iron deficiency anemias: Secondary | ICD-10-CM | POA: Diagnosis not present

## 2024-06-22 DIAGNOSIS — Z9884 Bariatric surgery status: Secondary | ICD-10-CM

## 2024-06-22 DIAGNOSIS — E611 Iron deficiency: Secondary | ICD-10-CM | POA: Diagnosis not present

## 2024-06-22 LAB — CBC WITH DIFFERENTIAL (CANCER CENTER ONLY)
Abs Immature Granulocytes: 0.02 10*3/uL (ref 0.00–0.07)
Basophils Absolute: 0.1 10*3/uL (ref 0.0–0.1)
Basophils Relative: 1 %
Eosinophils Absolute: 0.2 10*3/uL (ref 0.0–0.5)
Eosinophils Relative: 5 %
HCT: 43.6 % (ref 36.0–46.0)
Hemoglobin: 14.2 g/dL (ref 12.0–15.0)
Immature Granulocytes: 0 %
Lymphocytes Relative: 24 %
Lymphs Abs: 1.1 10*3/uL (ref 0.7–4.0)
MCH: 29.2 pg (ref 26.0–34.0)
MCHC: 32.6 g/dL (ref 30.0–36.0)
MCV: 89.7 fL (ref 80.0–100.0)
Monocytes Absolute: 0.4 10*3/uL (ref 0.1–1.0)
Monocytes Relative: 8 %
Neutro Abs: 2.8 10*3/uL (ref 1.7–7.7)
Neutrophils Relative %: 62 %
Platelet Count: 276 10*3/uL (ref 150–400)
RBC: 4.86 MIL/uL (ref 3.87–5.11)
RDW: 12.3 % (ref 11.5–15.5)
WBC Count: 4.6 10*3/uL (ref 4.0–10.5)
nRBC: 0 % (ref 0.0–0.2)

## 2024-06-22 LAB — CMP (CANCER CENTER ONLY)
ALT: 12 U/L (ref 0–44)
AST: 21 U/L (ref 15–41)
Albumin: 4.4 g/dL (ref 3.5–5.0)
Alkaline Phosphatase: 113 U/L (ref 38–126)
Anion gap: 12 (ref 5–15)
BUN: 12 mg/dL (ref 6–20)
CO2: 24 mmol/L (ref 22–32)
Calcium: 9.7 mg/dL (ref 8.9–10.3)
Chloride: 105 mmol/L (ref 98–111)
Creatinine: 1.04 mg/dL — ABNORMAL HIGH (ref 0.44–1.00)
GFR, Estimated: >60 mL/min (ref >60)
Glucose, Bld: 128 mg/dL — ABNORMAL HIGH (ref 70–99)
Potassium: 3.6 mmol/L (ref 3.5–5.1)
Sodium: 141 mmol/L (ref 135–145)
Total Bilirubin: 0.3 mg/dL (ref 0.0–1.2)
Total Protein: 6.7 g/dL (ref 6.5–8.1)

## 2024-06-22 LAB — IRON AND TIBC
Iron: 118 ug/dL (ref 28–170)
Saturation Ratios: 27 % (ref 10.4–31.8)
TIBC: 431 ug/dL (ref 250–450)
UIBC: 313 ug/dL

## 2024-06-22 LAB — FERRITIN: Ferritin: 23 ng/mL (ref 11–307)

## 2024-06-22 NOTE — Telephone Encounter (Signed)
 Patient has been scheduled for follow-up visit per 06/20/24 LOS.  Pt aware of scheduled appt details.

## 2024-06-22 NOTE — Progress Notes (Cosign Needed)
 [facility]  [address] [place],  [place]  [address] [phone]  Clinic Day:  06/22/2024  Referring physician: Clemmie Nest, MD   HISTORY OF PRESENT ILLNESS:  The patient is a 54 y.o. female with a history of iron deficiency felt to be most likely related to heavy menses, who we began seeing in April 2023.  Of note, she also has had a gastric sleeve procedure, so she may also have decreased absorption. She was unable to tolerate oral iron supplement, so was treated with IV Feraheme  in April 2023 with normalization of her hemoglobin and iron stores.  She has not required retreatment with IV iron.  She was also found to have B12 deficiency and continues B12 250 mcg subcutaneously weekly.  This is being done to avoid significant interaction with her risperidone .  EGD and colonoscopy in December 2023 did not reveal any sign of recent or active bleeding.  3 gastric erosions were seen and post surgical changes from bypass. She had removal of 3 polyps, 1 hyperplastic polyp from the cecum and 2 tubular adenomas from the transverse colon.  Follow-up colonoscopy in 3 years was recommended.  Her bariatric surgeon recommended a bariatric multivitamin, as well as calcium.  Due to her family history of malignancy, she underwent testing for hereditary cancer syndromes with [facility] CancerNext-Expanded +RNAinsight Panel.  This did not reveal any clinically significant mutation or variants of uncertain significance.  At her visit in December 2024, she reported pain and swelling in the left leg.  Ultrasound was negative for DVT or other abnormality.  She was seen in the emergency room in January with lower abdominal pain, as well as dysuria and constipation.  CBC was normal.  There was elevated liver enzymes.  CT chest, abdomen and pelvis revealed acute sigmoid diverticulitis without perforation or abscess.  Possible tiny 1 mm obstructing stone distal left ureter with left-sided  hydronephrosis.  She was treated with levofloxacin and metronidazole .  She was given tamsulosin and oxycodone  due to the ureteral obstruction.  She had had acute diverticulitis in November 2024 which was treated with levofloxacin and metronidazole . She states her symptoms of colitis have resolved and she passed the kidney stones.  She is here today for routine follow-up. She reports persistent fatigue, which she attributes to going through menopause. She has not had a menstrual cycle in 1 year.  She denies pica to ice. She denies any overt form of blood loss.  She continues her bariatric vitamin, as well as a woman's multiple vitamin.  PHYSICAL EXAM:  Blood pressure 122/88, pulse 93, temperature 98.6 F (37 C), resp. rate 20, height 5' 3 (1.6 m), weight 182 lb 8 oz (82.8 kg), SpO2 99%. Wt Readings from Last 3 Encounters:  06/22/24 182 lb 8 oz (82.8 kg)  06/18/24 185 lb (83.9 kg)  04/13/24 177 lb (80.3 kg)   Body mass index is 32.33 kg/m.  Performance status (ECOG): 1 - Symptomatic but completely ambulatory  Physical Exam Vitals and nursing note reviewed.  Constitutional:      General: She is not in acute distress.    Appearance: Normal appearance.  HENT:     Head: Normocephalic and atraumatic.     Mouth/Throat:     Mouth: Mucous membranes are moist.     Pharynx: Oropharynx is clear. No oropharyngeal exudate or posterior oropharyngeal erythema.   Eyes:     General: No scleral icterus.    Extraocular Movements: Extraocular movements intact.  Conjunctiva/sclera: Conjunctivae normal.     Pupils: Pupils are equal, round, and reactive to light.    Cardiovascular:     Rate and Rhythm: Normal rate and regular rhythm.     Heart sounds: Normal heart sounds. No murmur heard.    No friction rub. No gallop.  Pulmonary:     Effort: Pulmonary effort is normal.     Breath sounds: Normal breath sounds. No wheezing, rhonchi or rales.  Abdominal:     General: There is no distension.      Palpations: Abdomen is soft. There is no hepatomegaly, splenomegaly or mass.     Tenderness: There is no abdominal tenderness.   Musculoskeletal:        General: Normal range of motion.     Cervical back: Normal range of motion and neck supple. No tenderness.     Right lower leg: No edema.     Left lower leg: No edema.  Lymphadenopathy:     Cervical: No cervical adenopathy.     Upper Body:     Right upper body: No supraclavicular or axillary adenopathy.     Left upper body: No supraclavicular or axillary adenopathy.     Lower Body: No right inguinal adenopathy. No left inguinal adenopathy.   Skin:    General: Skin is warm and dry.     Coloration: Skin is not jaundiced.     Findings: No rash.   Neurological:     Mental Status: She is alert and oriented to person, place, and time.     Cranial Nerves: No cranial nerve deficit.   Psychiatric:        Mood and Affect: Mood normal.        Behavior: Behavior normal.        Thought Content: Thought content normal.     LABS:      Latest Ref Rng & Units 06/22/2024   10:16 AM 05/01/2024   11:44 AM 01/18/2024   12:06 PM  CBC  WBC 4.0 - 10.5 K/uL 4.6  5.4  6.3   Hemoglobin 12.0 - 15.0 g/dL 85.7  85.6  85.7   Hematocrit 36.0 - 46.0 % 43.6  41.7  42.3   Platelets 150 - 400 K/uL 276  291  321.0       Latest Ref Rng & Units 06/22/2024   10:16 AM 01/18/2024   12:06 PM 01/10/2024   12:00 AM  CMP  Glucose 70 - 99 mg/dL 871  91    BUN 6 - 20 mg/dL 12  10    Creatinine 9.55 - 1.00 mg/dL 8.95  9.17    Sodium 864 - 145 mmol/L 141  140    Potassium 3.5 - 5.1 mmol/L 3.6  4.3    Chloride 98 - 111 mmol/L 105  104    CO2 22 - 32 mmol/L 24  30    Calcium 8.9 - 10.3 mg/dL 9.7  9.2  9.3      Total Protein 6.5 - 8.1 g/dL 6.7  6.9    Total Bilirubin 0.0 - 1.2 mg/dL 0.3  0.5    Alkaline Phos 38 - 126 U/L 113  102    AST 15 - 41 U/L 21  21    ALT 0 - 44 U/L 12  17       This result is from an external source.    Lab Results  Component Value  Date   TIBC 431 06/22/2024   TIBC 395 05/01/2024  TIBC 353 12/23/2023   FERRITIN 23 06/22/2024   FERRITIN 31 05/01/2024   FERRITIN 31 12/23/2023   IRONPCTSAT 27 06/22/2024   IRONPCTSAT 29 05/01/2024   IRONPCTSAT 32 (H) 12/23/2023   Lab Results  Component Value Date   LDH 94 (L) 03/30/2016       Component Value Date/Time   LDH 94 (L) 03/30/2016 1830    Review Flowsheet  More data exists      Latest Ref Rng & Units 12/23/2023 05/01/2024 06/22/2024  Oncology Labs  Ferritin 11 - 307 ng/mL 31  31  23    %SAT 10.4 - 31.8 % 32  29  27     Latest Reference Range & Units 05/01/24 11:44  Iron 28 - 170 ug/dL 883  UIBC ug/dL 720  TIBC 749 - 549 ug/dL 604  Saturation Ratios 10.4 - 31.8 % 29  Ferritin 11 - 307 ng/mL 31  Folate >5.9 ng/mL >40.0  Vitamin B12 180 - 914 pg/mL 1,081 (H)  (H): Data is abnormally high  STUDIES:  No results found.    ASSESSMENT & PLAN:   Assessment/Plan:  53 y.o. female with both iron and B12 deficiency.  She has not had recurrent anemia and her vitamin levels are in good range.  She has mild intermittent leukopenia back to October 2022.  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  Her white count is normal today.  We will plan to continue follow-up and see her back in 6 months for repeat clinical assessment.  The patient understands all the plans discussed today and is in agreement with them.  She knows to contact our office if she develops concerns prior to her next appointment.     Andrez DELENA Foy, PA-C   Physician Assistant [facility] [place] [phone]

## 2024-06-27 DIAGNOSIS — M79672 Pain in left foot: Secondary | ICD-10-CM | POA: Diagnosis not present

## 2024-07-02 DIAGNOSIS — M79672 Pain in left foot: Secondary | ICD-10-CM | POA: Diagnosis not present

## 2024-07-10 ENCOUNTER — Encounter: Payer: Self-pay | Admitting: Dermatology

## 2024-07-10 ENCOUNTER — Ambulatory Visit: Admitting: Dermatology

## 2024-07-10 VITALS — BP 129/78 | HR 88

## 2024-07-10 DIAGNOSIS — W908XXA Exposure to other nonionizing radiation, initial encounter: Secondary | ICD-10-CM | POA: Diagnosis not present

## 2024-07-10 DIAGNOSIS — L821 Other seborrheic keratosis: Secondary | ICD-10-CM | POA: Diagnosis not present

## 2024-07-10 DIAGNOSIS — L910 Hypertrophic scar: Secondary | ICD-10-CM

## 2024-07-10 DIAGNOSIS — K660 Peritoneal adhesions (postprocedural) (postinfection): Secondary | ICD-10-CM

## 2024-07-10 DIAGNOSIS — Z85828 Personal history of other malignant neoplasm of skin: Secondary | ICD-10-CM | POA: Diagnosis not present

## 2024-07-10 DIAGNOSIS — L578 Other skin changes due to chronic exposure to nonionizing radiation: Secondary | ICD-10-CM

## 2024-07-10 DIAGNOSIS — D1801 Hemangioma of skin and subcutaneous tissue: Secondary | ICD-10-CM

## 2024-07-10 DIAGNOSIS — D229 Melanocytic nevi, unspecified: Secondary | ICD-10-CM

## 2024-07-10 DIAGNOSIS — L814 Other melanin hyperpigmentation: Secondary | ICD-10-CM

## 2024-07-10 DIAGNOSIS — D239 Other benign neoplasm of skin, unspecified: Secondary | ICD-10-CM

## 2024-07-10 DIAGNOSIS — D2371 Other benign neoplasm of skin of right lower limb, including hip: Secondary | ICD-10-CM

## 2024-07-10 DIAGNOSIS — Z1283 Encounter for screening for malignant neoplasm of skin: Secondary | ICD-10-CM

## 2024-07-10 MED ORDER — TRIAMCINOLONE ACETONIDE 40 MG/ML IJ SUSP
40.0000 mg | Freq: Once | INTRAMUSCULAR | Status: AC
  Administered 2024-07-10: 40 mg via INTRAMUSCULAR

## 2024-07-10 NOTE — Progress Notes (Unsigned)
 New Patient Visit   Subjective  [name] [name] is a 53 y.o. female who presents for the following: Skin Cancer Screening and Full Body Skin Exam  The patient presents for Total-Body Skin Exam (TBSE) for skin cancer screening and mole check. The patient has spots, moles and lesions to be evaluated, some may be new or changing.  No family hx of MM Hx of BCC- left shoulder- 2017  The following portions of the chart were reviewed this encounter and updated as appropriate: medications, allergies, medical history  Review of Systems:  No other skin or systemic complaints except as noted in HPI or Assessment and Plan.  Objective  Well appearing patient in no apparent distress; mood and affect are within normal limits.  A full examination was performed including scalp, head, eyes, ears, nose, lips, neck, chest, axillae, abdomen, back, buttocks, bilateral upper extremities, bilateral lower extremities, hands, feet, fingers, toes, fingernails, and toenails. All findings within normal limits unless otherwise noted below.   Relevant physical exam findings are noted in the Assessment and Plan.  Left Upper Back Procedure Note Intralesional Injection  Location: left back  Informed Consent: Discussed risks (infection, pain, bleeding, bruising, thinning of the skin, loss of skin pigment, lack of resolution, and recurrence of lesion) and benefits of the procedure, as well as the alternatives. Informed consent was obtained. Preparation: The area was prepared a standard fashion.  Anesthesia:N/a  Procedure Details: An intralesional injection was performed with Kenalog  40 mg/cc. 0.5 cc in total were injected. NDC #: [id number] Exp: 05/26/25  Total number of injections: 1  Plan: The patient was instructed on post-op care. Recommend OTC analgesia as needed for pain.   Assessment & Plan   SKIN CANCER SCREENING PERFORMED TODAY.  ACTINIC DAMAGE - Chronic condition, secondary to cumulative  UV/sun exposure - diffuse scaly erythematous macules with underlying dyspigmentation - Recommend daily broad spectrum sunscreen SPF 30+ to sun-exposed areas, reapply every 2 hours as needed.  - Staying in the shade or wearing long sleeves, sun glasses (UVA+UVB protection) and wide brim hats (4-inch brim around the entire circumference of the hat) are also recommended for sun protection.  - Call for new or changing lesions.  MELANOCYTIC NEVI - Tan-brown and/or pink-flesh-colored symmetric macules and papules - Benign appearing on exam today - Observation - Call clinic for new or changing moles - Recommend daily use of broad spectrum spf 30+ sunscreen to sun-exposed areas.   LENTIGINES Exam: scattered tan macules Due to sun exposure Treatment Plan: Benign-appearing, observe. Recommend daily broad spectrum sunscreen SPF 30+ to sun-exposed areas, reapply every 2 hours as needed.  Call for any changes   HEMANGIOMA Exam: red papule(s) Discussed benign nature. Recommend observation. Call for changes.   SEBORRHEIC KERATOSIS - Stuck-on, waxy, tan-brown papules and/or plaques  - Benign-appearing - Discussed benign etiology and prognosis. 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  - Call if any new or changing lesions are noted between office visits    DERMATOFIBROMA left lower leg Exam: Firm pink/brown papulenodule with dimple sign. Treatment Plan: A dermatofibroma is a benign growth possibly related to trauma, such as an insect bite, cut from shaving, or inflamed acne-type bump.  Treatment options to remove include shave or excision with resulting  scar and risk of recurrence.  Since benign-appearing and not bothersome, will observe for now.    Internal  Scar Adhesions from Previous Surgery - Painful - will refer to general surgery  KELOID Exam shows Firm pink/brown dermal papule(s)/plaque(s).  Treatment Plan: ILK 40  Location: left shoulder/upper back  Informed Consent: Discussed risks (infection, pain, bleeding, bruising, thinning of the skin, loss of skin pigment, lack of resolution, and recurrence of lesion) and benefits of the procedure, as well as the alternatives. Informed consent was obtained. Preparation: The area was prepared a standard fashion.   Procedure Details: An intralesional injection was performed with Kenalog  40 mg/cc. 0.5 cc in total were injected.  Total number of injections: 1  Plan: The patient was instructed on post-op care. Recommend OTC analgesia as needed for pain.  Return in about 1 year (around 07/10/2025) for TBSE.  I, Darice Smock, CMA, am acting as scribe for RUFUS CHRISTELLA HOLY, MD.   Documentation: I have reviewed the above documentation for accuracy and completeness, and I agree with the above.  RUFUS CHRISTELLA HOLY, MD

## 2024-07-10 NOTE — Patient Instructions (Addendum)
 tel:[phone]  Skin Education : We counseled the patient regarding the following: Sun screen (SPF 30 or greater) should be applied during peak UV exposure (between 10am and 2pm) and reapplied after exercise or swimming.  The ABCDEs of melanoma were reviewed with the patient, and the importance of monthly self-examination of moles was emphasized. Should any moles change in shape or color, or itch, bleed or burn, pt will contact our office for evaluation sooner then their interval appointment.  Plan: Sunscreen Recommendations We recommended a broad spectrum sunscreen with a SPF of 30 or higher. SPF 30 sunscreens block approximately 97 percent of the sun's harmful rays. Sunscreens should be applied at least 15 minutes prior to expected sun exposure and then every 2 hours after that as long as sun exposure continues. If swimming or exercising sunscreen should be reapplied every 45 minutes to an hour after getting wet or sweating. One ounce, or the equivalent of a shot glass full of sunscreen, is adequate to protect the skin not covered by a bathing suit. We also recommended a lip balm with a sunscreen as well. Sun protective clothing can be used in lieu of sunscreen but must be worn the entire time you are exposed to the sun's rays.  Important Information   Due to recent changes in healthcare laws, you may see results of your pathology and/or laboratory studies on MyChart before the doctors have had a chance to review them. We understand that in some cases there may be results that are confusing or concerning to you. Please understand that not all results are received at the same time and often the doctors may need to interpret multiple results in order to provide you with the best plan of care or course of treatment. Therefore, we ask that you please give us  2 business days to thoroughly review all your results before contacting the office for clarification. Should we see a critical lab result, you will  be contacted sooner.     If You Need Anything After Your Visit   If you have any questions or concerns for your doctor, please call our main line at [phone]. If no one answers, please leave a voicemail as directed and we will return your call as soon as possible. Messages left after 4 pm will be answered the following business day.    You may also send us  a message via MyChart. We typically respond to MyChart messages within 1-2 business days.  For prescription refills, please ask your pharmacy to contact our office. Our fax number is [phone].  If you have an urgent issue when the clinic is closed that cannot wait until the next business day, you can page your doctor at the number below.     Please note that while we do our best to be available for urgent issues outside of office hours, we are not available 24/7.    If you have an urgent issue and are unable to reach us , you may choose to seek medical care at your doctor's office, retail clinic, urgent care center, or emergency room.   If you have a medical emergency, please immediately call 911 or go to the emergency department. In the event of inclement weather, please call our main line at [phone] for an update on the status of any delays or closures.  Dermatology Medication Tips: Please keep the boxes that topical medications come in in order to help keep track of the instructions about where and how to use these. Pharmacies  typically print the medication instructions only on the boxes and not directly on the medication tubes.   If your medication is too expensive, please contact our office at [phone] or send us  a message through MyChart.    We are unable to tell what your co-pay for medications will be in advance as this is different depending on your insurance coverage. However, we may be able to find a substitute medication at lower cost or fill out paperwork to get insurance to cover a needed medication.    If a  prior authorization is required to get your medication covered by your insurance company, please allow us  1-2 business days to complete this process.   Drug prices often vary depending on where the prescription is filled and some pharmacies may offer cheaper prices.   The website www.goodrx.com contains coupons for medications through different pharmacies. The prices here do not account for what the cost may be with help from insurance (it may be cheaper with your insurance), but the website can give you the price if you did not use any insurance.  - You can print the associated coupon and take it with your prescription to the pharmacy.  - You may also stop by our office during regular business hours and pick up a GoodRx coupon card.  - If you need your prescription sent electronically to a different pharmacy, notify our office through [facility] or by phone at [phone]

## 2024-07-14 DIAGNOSIS — J988 Other specified respiratory disorders: Secondary | ICD-10-CM | POA: Diagnosis not present

## 2024-07-14 DIAGNOSIS — R609 Edema, unspecified: Secondary | ICD-10-CM | POA: Diagnosis not present

## 2024-07-16 DIAGNOSIS — M79672 Pain in left foot: Secondary | ICD-10-CM | POA: Diagnosis not present

## 2024-07-19 ENCOUNTER — Ambulatory Visit: Admitting: General Surgery

## 2024-07-23 DIAGNOSIS — M79672 Pain in left foot: Secondary | ICD-10-CM | POA: Diagnosis not present

## 2024-07-24 DIAGNOSIS — M722 Plantar fascial fibromatosis: Secondary | ICD-10-CM | POA: Diagnosis not present

## 2024-07-24 DIAGNOSIS — M6702 Short Achilles tendon (acquired), left ankle: Secondary | ICD-10-CM | POA: Diagnosis not present

## 2024-07-24 DIAGNOSIS — M76822 Posterior tibial tendinitis, left leg: Secondary | ICD-10-CM | POA: Diagnosis not present

## 2024-07-24 DIAGNOSIS — M6701 Short Achilles tendon (acquired), right ankle: Secondary | ICD-10-CM | POA: Diagnosis not present

## 2024-07-30 DIAGNOSIS — M79672 Pain in left foot: Secondary | ICD-10-CM | POA: Diagnosis not present

## 2024-07-31 ENCOUNTER — Ambulatory Visit: Admitting: General Surgery

## 2024-07-31 ENCOUNTER — Encounter: Payer: Self-pay | Admitting: General Surgery

## 2024-07-31 VITALS — BP 160/107 | HR 105 | Ht 63.0 in | Wt 190.0 lb

## 2024-07-31 DIAGNOSIS — R1084 Generalized abdominal pain: Secondary | ICD-10-CM

## 2024-07-31 NOTE — Patient Instructions (Signed)
   Follow-up with our office as needed.  Please call and ask to speak with a nurse if you develop questions or concerns.
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
## 2024-08-06 DIAGNOSIS — M79672 Pain in left foot: Secondary | ICD-10-CM | POA: Diagnosis not present

## 2024-08-08 ENCOUNTER — Encounter: Payer: Self-pay | Admitting: Dermatology

## 2024-08-08 ENCOUNTER — Ambulatory Visit: Admitting: Dermatology

## 2024-08-08 VITALS — BP 146/86 | HR 69

## 2024-08-08 DIAGNOSIS — L910 Hypertrophic scar: Secondary | ICD-10-CM | POA: Diagnosis not present

## 2024-08-08 NOTE — Patient Instructions (Signed)

## 2024-08-08 NOTE — Progress Notes (Signed)
   Follow-Up Visit   Subjective  [name] [name] is a 53 y.o. female who presents for the following: keloid  Pt here for injection of keloid on left upper back. Previously treated with ILK40 on 07/10/2024. Patient states it is no longer itchy or bothersome.   The following portions of the chart were reviewed this encounter and updated as appropriate: medications, allergies, medical history  Review of Systems:  No other skin or systemic complaints except as noted in HPI or Assessment and Plan.  Objective  Well appearing patient in no apparent distress; mood and affect are within normal limits.  A focused examination was performed of the following areas: Left upper back  Relevant exam findings are noted in the Assessment and Plan.    Assessment & Plan   KELOID Left Flank Previously injected with Kenalog  40.  It has flattened and stopped itching.  We will continue to monitor it and recheck it in 6 months to see if it needs any further treatment.  Return in about 6 months (around 02/08/2025) for keloid recheck . I, Berwyn Lesches, Surg Tech III, am acting as scribe for RUFUS CHRISTELLA HOLY, MD.    Documentation: I have reviewed the above documentation for accuracy and completeness, and I agree with the above.  RUFUS CHRISTELLA HOLY, MD

## 2024-08-15 DIAGNOSIS — R319 Hematuria, unspecified: Secondary | ICD-10-CM | POA: Diagnosis not present

## 2024-08-17 ENCOUNTER — Emergency Department (HOSPITAL_COMMUNITY)

## 2024-08-17 ENCOUNTER — Emergency Department (HOSPITAL_COMMUNITY)
Admission: EM | Admit: 2024-08-17 | Discharge: 2024-08-17 | Disposition: A | Discharge status: Home / Self Care | Attending: Emergency Medicine | Admitting: Emergency Medicine

## 2024-08-17 ENCOUNTER — Other Ambulatory Visit: Payer: Self-pay

## 2024-08-17 DIAGNOSIS — R110 Nausea: Secondary | ICD-10-CM | POA: Insufficient documentation

## 2024-08-17 DIAGNOSIS — N3289 Other specified disorders of bladder: Secondary | ICD-10-CM | POA: Diagnosis not present

## 2024-08-17 DIAGNOSIS — R1084 Generalized abdominal pain: Secondary | ICD-10-CM | POA: Diagnosis not present

## 2024-08-17 DIAGNOSIS — R112 Nausea with vomiting, unspecified: Secondary | ICD-10-CM | POA: Diagnosis not present

## 2024-08-17 DIAGNOSIS — K760 Fatty (change of) liver, not elsewhere classified: Secondary | ICD-10-CM | POA: Diagnosis not present

## 2024-08-17 DIAGNOSIS — I10 Essential (primary) hypertension: Secondary | ICD-10-CM | POA: Insufficient documentation

## 2024-08-17 DIAGNOSIS — R109 Unspecified abdominal pain: Secondary | ICD-10-CM | POA: Diagnosis not present

## 2024-08-17 DIAGNOSIS — R1032 Left lower quadrant pain: Secondary | ICD-10-CM | POA: Diagnosis present

## 2024-08-17 LAB — URINALYSIS, ROUTINE W REFLEX MICROSCOPIC
Bilirubin Urine: NEGATIVE
Glucose, UA: NEGATIVE mg/dL
Hgb urine dipstick: NEGATIVE
Ketones, ur: NEGATIVE mg/dL
Leukocytes,Ua: NEGATIVE
Nitrite: NEGATIVE
Protein, ur: NEGATIVE mg/dL
Specific Gravity, Urine: 1.011 (ref 1.005–1.030)
pH: 6 (ref 5.0–8.0)

## 2024-08-17 LAB — COMPREHENSIVE METABOLIC PANEL WITH GFR
ALT: 10 U/L (ref 0–44)
AST: 16 U/L (ref 15–41)
Albumin: 3.9 g/dL (ref 3.5–5.0)
Alkaline Phosphatase: 87 U/L (ref 38–126)
Anion gap: 12 (ref 5–15)
BUN: 9 mg/dL (ref 6–20)
CO2: 22 mmol/L (ref 22–32)
Calcium: 9.2 mg/dL (ref 8.9–10.3)
Chloride: 105 mmol/L (ref 98–111)
Creatinine, Ser: 0.79 mg/dL (ref 0.44–1.00)
GFR, Estimated: >60 mL/min (ref >60)
Glucose, Bld: 118 mg/dL — ABNORMAL HIGH (ref 70–99)
Potassium: 4 mmol/L (ref 3.5–5.1)
Sodium: 139 mmol/L (ref 135–145)
Total Bilirubin: 0.6 mg/dL (ref 0.0–1.2)
Total Protein: 7.1 g/dL (ref 6.5–8.1)

## 2024-08-17 LAB — CBC
HCT: 44.1 % (ref 36.0–46.0)
Hemoglobin: 13.8 g/dL (ref 12.0–15.0)
MCH: 28.5 pg (ref 26.0–34.0)
MCHC: 31.3 g/dL (ref 30.0–36.0)
MCV: 90.9 fL (ref 80.0–100.0)
Platelets: 298 K/uL (ref 150–400)
RBC: 4.85 MIL/uL (ref 3.87–5.11)
RDW: 12.4 % (ref 11.5–15.5)
WBC: 5.5 K/uL (ref 4.0–10.5)
nRBC: 0 % (ref 0.0–0.2)

## 2024-08-17 LAB — TROPONIN I (HIGH SENSITIVITY): Troponin I (High Sensitivity): 2 ng/L (ref <18)

## 2024-08-17 LAB — LIPASE, BLOOD: Lipase: 38 U/L (ref 11–51)

## 2024-08-17 MED ORDER — IOHEXOL 300 MG/ML  SOLN
100.0000 mL | Freq: Once | INTRAMUSCULAR | Status: AC | PRN
  Administered 2024-08-17: 100 mL via INTRAVENOUS

## 2024-08-17 MED ORDER — ALUM & MAG HYDROXIDE-SIMETH 200-200-20 MG/5ML PO SUSP
30.0000 mL | Freq: Once | ORAL | Status: AC
  Administered 2024-08-17: 30 mL via ORAL
  Filled 2024-08-17: qty 30

## 2024-08-17 MED ORDER — ONDANSETRON 4 MG PO TBDP: 4.0000 mg | ORAL_TABLET | Freq: Three times a day (TID) | ORAL | 0 refills | Status: DC | PRN

## 2024-08-17 MED ORDER — ONDANSETRON HCL 4 MG/2ML IJ SOLN
4.0000 mg | Freq: Once | INTRAMUSCULAR | Status: AC
  Administered 2024-08-17: 4 mg via INTRAVENOUS
  Filled 2024-08-17: qty 2

## 2024-08-17 NOTE — ED Triage Notes (Signed)
 Pt has c/o abdominal pain and bloating since 8/20. Pt states when she lays down, pain radiates to her back. Pt has hx of IBS, kidney stones
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
## 2024-08-17 NOTE — ED Provider Triage Note (Signed)
 Emergency Medicine Provider Triage Evaluation Note  [name] [name] , a 53 y.o. female  was evaluated in triage.  Pt complains of abdominal pain swelling and bloating  Review of Systems  Positive: Pain and nausea Negative: diarrhea  Physical Exam  BP (!) 157/102 (BP Location: Left Arm)   Pulse 79   Temp 98.7 F (37.1 C) (Oral)   Resp 18   Ht 5' 3 (1.6 m)   Wt 85.7 kg   SpO2 97%   BMI 33.48 kg/m  Gen:   Awake, no distress   Resp:  Normal effort  MSK:   Moves extremities without difficulty  Other:  Abdomen diffusely tender   Medical Decision Making  Medically screening exam initiated at 2:07 PM.  Appropriate orders placed.  [name] [name] was informed that the remainder of the evaluation will be completed by another provider, this initial triage assessment does not replace that evaluation, and the importance of remaining in the ED until their evaluation is complete.     Flint Sonny POUR, PA-C 08/17/24 1408

## 2024-08-20 DIAGNOSIS — M79672 Pain in left foot: Secondary | ICD-10-CM | POA: Diagnosis not present

## 2024-08-20 DIAGNOSIS — K2970 Gastritis, unspecified, without bleeding: Secondary | ICD-10-CM | POA: Diagnosis not present

## 2024-08-20 DIAGNOSIS — M79671 Pain in right foot: Secondary | ICD-10-CM | POA: Diagnosis not present

## 2024-08-20 DIAGNOSIS — Z23 Encounter for immunization: Secondary | ICD-10-CM | POA: Diagnosis not present

## 2024-08-20 DIAGNOSIS — E538 Deficiency of other specified B group vitamins: Secondary | ICD-10-CM | POA: Diagnosis not present

## 2024-08-20 DIAGNOSIS — M545 Low back pain, unspecified: Secondary | ICD-10-CM | POA: Diagnosis not present

## 2024-08-20 DIAGNOSIS — K589 Irritable bowel syndrome without diarrhea: Secondary | ICD-10-CM | POA: Diagnosis not present

## 2024-08-21 ENCOUNTER — Other Ambulatory Visit: Payer: Self-pay | Admitting: Physician Assistant

## 2024-08-22 DIAGNOSIS — M6702 Short Achilles tendon (acquired), left ankle: Secondary | ICD-10-CM | POA: Diagnosis not present

## 2024-08-22 DIAGNOSIS — M6701 Short Achilles tendon (acquired), right ankle: Secondary | ICD-10-CM | POA: Diagnosis not present

## 2024-08-22 DIAGNOSIS — M722 Plantar fascial fibromatosis: Secondary | ICD-10-CM | POA: Diagnosis not present

## 2024-08-22 DIAGNOSIS — M76822 Posterior tibial tendinitis, left leg: Secondary | ICD-10-CM | POA: Diagnosis not present

## 2024-08-23 DIAGNOSIS — R2689 Other abnormalities of gait and mobility: Secondary | ICD-10-CM | POA: Diagnosis not present

## 2024-08-23 DIAGNOSIS — M25572 Pain in left ankle and joints of left foot: Secondary | ICD-10-CM | POA: Diagnosis not present

## 2024-08-23 DIAGNOSIS — M79671 Pain in right foot: Secondary | ICD-10-CM | POA: Diagnosis not present

## 2024-08-23 DIAGNOSIS — M25372 Other instability, left ankle: Secondary | ICD-10-CM | POA: Diagnosis not present

## 2024-08-26 ENCOUNTER — Other Ambulatory Visit: Payer: Self-pay | Admitting: Physician Assistant

## 2024-08-29 ENCOUNTER — Telehealth: Admitting: Physician Assistant

## 2024-08-31 DIAGNOSIS — R2689 Other abnormalities of gait and mobility: Secondary | ICD-10-CM | POA: Diagnosis not present

## 2024-08-31 DIAGNOSIS — M25572 Pain in left ankle and joints of left foot: Secondary | ICD-10-CM | POA: Diagnosis not present

## 2024-08-31 DIAGNOSIS — M25372 Other instability, left ankle: Secondary | ICD-10-CM | POA: Diagnosis not present

## 2024-08-31 DIAGNOSIS — M79671 Pain in right foot: Secondary | ICD-10-CM | POA: Diagnosis not present

## 2024-09-03 DIAGNOSIS — M79671 Pain in right foot: Secondary | ICD-10-CM | POA: Diagnosis not present

## 2024-09-03 DIAGNOSIS — M25572 Pain in left ankle and joints of left foot: Secondary | ICD-10-CM | POA: Diagnosis not present

## 2024-09-03 DIAGNOSIS — M25372 Other instability, left ankle: Secondary | ICD-10-CM | POA: Diagnosis not present

## 2024-09-03 DIAGNOSIS — R2689 Other abnormalities of gait and mobility: Secondary | ICD-10-CM | POA: Diagnosis not present

## 2024-09-05 DIAGNOSIS — M25572 Pain in left ankle and joints of left foot: Secondary | ICD-10-CM | POA: Diagnosis not present

## 2024-09-05 DIAGNOSIS — M25372 Other instability, left ankle: Secondary | ICD-10-CM | POA: Diagnosis not present

## 2024-09-05 DIAGNOSIS — M79671 Pain in right foot: Secondary | ICD-10-CM | POA: Diagnosis not present

## 2024-09-05 DIAGNOSIS — R2689 Other abnormalities of gait and mobility: Secondary | ICD-10-CM | POA: Diagnosis not present

## 2024-09-07 DIAGNOSIS — R109 Unspecified abdominal pain: Secondary | ICD-10-CM | POA: Diagnosis not present

## 2024-09-07 DIAGNOSIS — K625 Hemorrhage of anus and rectum: Secondary | ICD-10-CM | POA: Diagnosis not present

## 2024-09-10 DIAGNOSIS — M25372 Other instability, left ankle: Secondary | ICD-10-CM | POA: Diagnosis not present

## 2024-09-10 DIAGNOSIS — R2689 Other abnormalities of gait and mobility: Secondary | ICD-10-CM | POA: Diagnosis not present

## 2024-09-10 DIAGNOSIS — M79671 Pain in right foot: Secondary | ICD-10-CM | POA: Diagnosis not present

## 2024-09-10 DIAGNOSIS — M25572 Pain in left ankle and joints of left foot: Secondary | ICD-10-CM | POA: Diagnosis not present

## 2024-09-12 ENCOUNTER — Ambulatory Visit: Admitting: Gastroenterology

## 2024-09-12 DIAGNOSIS — M25572 Pain in left ankle and joints of left foot: Secondary | ICD-10-CM | POA: Diagnosis not present

## 2024-09-12 DIAGNOSIS — R2689 Other abnormalities of gait and mobility: Secondary | ICD-10-CM | POA: Diagnosis not present

## 2024-09-12 DIAGNOSIS — M25372 Other instability, left ankle: Secondary | ICD-10-CM | POA: Diagnosis not present

## 2024-09-12 DIAGNOSIS — M79671 Pain in right foot: Secondary | ICD-10-CM | POA: Diagnosis not present

## 2024-09-13 ENCOUNTER — Telehealth: Admitting: Physician Assistant

## 2024-09-17 ENCOUNTER — Encounter: Payer: Self-pay | Admitting: Physician Assistant

## 2024-09-17 ENCOUNTER — Telehealth: Admitting: Physician Assistant

## 2024-09-17 DIAGNOSIS — F411 Generalized anxiety disorder: Secondary | ICD-10-CM

## 2024-09-17 DIAGNOSIS — F250 Schizoaffective disorder, bipolar type: Secondary | ICD-10-CM | POA: Diagnosis not present

## 2024-09-17 DIAGNOSIS — F4310 Post-traumatic stress disorder, unspecified: Secondary | ICD-10-CM | POA: Diagnosis not present

## 2024-09-17 DIAGNOSIS — F5105 Insomnia due to other mental disorder: Secondary | ICD-10-CM | POA: Diagnosis not present

## 2024-09-17 DIAGNOSIS — F99 Mental disorder, not otherwise specified: Secondary | ICD-10-CM
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
## 2024-10-01 DIAGNOSIS — M25361 Other instability, right knee: Secondary | ICD-10-CM | POA: Diagnosis not present

## 2024-10-03 DIAGNOSIS — M6701 Short Achilles tendon (acquired), right ankle: Secondary | ICD-10-CM | POA: Diagnosis not present

## 2024-10-03 DIAGNOSIS — M6702 Short Achilles tendon (acquired), left ankle: Secondary | ICD-10-CM | POA: Diagnosis not present

## 2024-10-03 DIAGNOSIS — M76822 Posterior tibial tendinitis, left leg: Secondary | ICD-10-CM | POA: Diagnosis not present

## 2024-10-03 DIAGNOSIS — M722 Plantar fascial fibromatosis: Secondary | ICD-10-CM | POA: Diagnosis not present

## 2024-10-04 DIAGNOSIS — M25561 Pain in right knee: Secondary | ICD-10-CM | POA: Diagnosis not present

## 2024-10-04 DIAGNOSIS — M25572 Pain in left ankle and joints of left foot: Secondary | ICD-10-CM | POA: Diagnosis not present

## 2024-10-07 ENCOUNTER — Encounter (HOSPITAL_COMMUNITY): Payer: Self-pay

## 2024-10-07 ENCOUNTER — Emergency Department (HOSPITAL_COMMUNITY)
Admission: EM | Admit: 2024-10-07 | Discharge: 2024-10-07 | Disposition: A | Discharge status: Home / Self Care | Attending: Emergency Medicine | Admitting: Emergency Medicine

## 2024-10-07 ENCOUNTER — Other Ambulatory Visit: Payer: Self-pay

## 2024-10-07 ENCOUNTER — Emergency Department (HOSPITAL_COMMUNITY)

## 2024-10-07 DIAGNOSIS — R7989 Other specified abnormal findings of blood chemistry: Secondary | ICD-10-CM | POA: Insufficient documentation

## 2024-10-07 DIAGNOSIS — R519 Headache, unspecified: Secondary | ICD-10-CM | POA: Diagnosis not present

## 2024-10-07 DIAGNOSIS — I10 Essential (primary) hypertension: Secondary | ICD-10-CM | POA: Insufficient documentation

## 2024-10-07 DIAGNOSIS — H538 Other visual disturbances: Secondary | ICD-10-CM | POA: Insufficient documentation

## 2024-10-07 DIAGNOSIS — H539 Unspecified visual disturbance: Secondary | ICD-10-CM

## 2024-10-07 DIAGNOSIS — R29818 Other symptoms and signs involving the nervous system: Secondary | ICD-10-CM | POA: Diagnosis not present

## 2024-10-07 DIAGNOSIS — H43393 Other vitreous opacities, bilateral: Secondary | ICD-10-CM | POA: Diagnosis not present

## 2024-10-07 LAB — CBC
HCT: 45 % (ref 36.0–46.0)
Hemoglobin: 14.4 g/dL (ref 12.0–15.0)
MCH: 28.9 pg (ref 26.0–34.0)
MCHC: 32 g/dL (ref 30.0–36.0)
MCV: 90.4 fL (ref 80.0–100.0)
Platelets: 336 K/uL (ref 150–400)
RBC: 4.98 MIL/uL (ref 3.87–5.11)
RDW: 13.1 % (ref 11.5–15.5)
WBC: 8.3 K/uL (ref 4.0–10.5)
nRBC: 0 % (ref 0.0–0.2)

## 2024-10-07 LAB — COMPREHENSIVE METABOLIC PANEL WITH GFR
ALT: 10 U/L (ref 0–44)
AST: 16 U/L (ref 15–41)
Albumin: 3.7 g/dL (ref 3.5–5.0)
Alkaline Phosphatase: 84 U/L (ref 38–126)
Anion gap: 8 (ref 5–15)
BUN: 13 mg/dL (ref 6–20)
CO2: 27 mmol/L (ref 22–32)
Calcium: 8.8 mg/dL — ABNORMAL LOW (ref 8.9–10.3)
Chloride: 104 mmol/L (ref 98–111)
Creatinine, Ser: 1.06 mg/dL — ABNORMAL HIGH (ref 0.44–1.00)
GFR, Estimated: >60 mL/min
Glucose, Bld: 135 mg/dL — ABNORMAL HIGH (ref 70–99)
Potassium: 4.2 mmol/L (ref 3.5–5.1)
Sodium: 139 mmol/L (ref 135–145)
Total Bilirubin: 0.6 mg/dL (ref 0.0–1.2)
Total Protein: 6.3 g/dL — ABNORMAL LOW (ref 6.5–8.1)

## 2024-10-07 NOTE — ED Triage Notes (Signed)
 Pt began experiencing a burst like fireworks - like white twinkly lights while taking shower last night at 1730.  This morning when pt woke up she was seeing gray floaters.  Was seen by pcp at [facility] who sent her here.

## 2024-10-07 NOTE — ED Provider Notes (Signed)
 [facility] AT [facility] Provider Note   CSN: [id number] Arrival date & time: 10/07/24  1410     History Chief Complaint  Patient presents with   Blurred Vision    HPI: [name] [name] is a 53 y.o. female with history pertinent for personality disorder, iron deficiency anemia, schizoaffective disorder, prolonged QT, PTSD, hyperprolactinemia, HTN, alcohol use disorder, presbyopia, elevated MRI, who presents complaining of visual changes. Patient arrived via POV accompanied by friend, Burnard.  History provided by patient.  No interpreter required during this encounter.  Patient reports that that she has a history of presbyopia, however does not use corrective lenses outside of reading glasses.  Reports that last night she developed some flashes of light in her vision that started in her left sided field of vision and moved centrally, reports that they were similar to fireworks, lasted several minutes and resolved spontaneously.  Is unable to definitively say whether or not these visual changes were monocular or binocular, however states that they would likely binocular.  Reports that these resolved, however this morning she had floaters in her bilateral vision, as well as some mild blurry vision.  Feels that the vision is now back to baseline.  Denies any recent trauma or falls, numbness, tingling, weakness, use of anticoagulants.  Friend at bedside states that patient's speech is at baseline.  Reports that she has previously followed with optometry, however does not have a usual ophthalmologist.  Patient's recorded medical, surgical, social, medication list and allergies were reviewed in the Snapshot window as part of the initial history.   Prior to Admission medications   Medication Sig Start Date End Date Taking? Authorizing Provider  acetaminophen  (TYLENOL ) 500 MG tablet Take 1,000 mg by mouth every 6 (six) hours as needed (For headache or back pain).     [provider]  busPIRone  (BUSPAR ) 15 MG tablet TAKE 1 TABLET BY MOUTH 4 TIMES  DAILY 08/26/24   Rhys Boyer T, PA-C  cyanocobalamin  (VITAMIN B12) 1000 MCG/ML injection INJECT INTRAMUSCULARLY 1 ML ONCE MONTHLY (DISCARD 28 DAYS AFTER  FIRST USE) 06/12/24   Mosher, Kelli A, PA-C  cyclobenzaprine (FLEXERIL) 5 MG tablet Take 5 mg by mouth 3 (three) times daily as needed for muscle spasms. 05/14/22   [provider]  dicyclomine  (BENTYL ) 20 MG tablet Take 1 tablet (20 mg total) by mouth every 12 (twelve) hours as needed for spasms. 10/31/23   Enedelia Dorna HERO, FNP  famotidine (PEPCID) 20 MG tablet Take by mouth.    [provider]  hyoscyamine  (ANASPAZ ) 0.125 MG TBDP disintergrating tablet     [provider]  LORazepam  (ATIVAN ) 1 MG tablet Take 0.5-1 tablets (0.5-1 mg total) by mouth at bedtime as needed for anxiety. 05/23/24   Rhys Boyer DASEN, PA-C  Misc Natural Products (BEET ROOT PO) Take by mouth.    [provider]  Multiple Vitamin (MULTI VITAMIN DAILY PO) Take 1 Dose by mouth daily.    [provider]  Multiple Vitamins-Minerals (AIRBORNE) CHEW Chew 1 tablet by mouth in the morning, at noon, and at bedtime.    [provider]  Multiple Vitamins-Minerals (BARIATRIC MULTIVITAMINS PO) Take 1 Dose by mouth daily.    [provider]  ondansetron  (ZOFRAN -ODT) 4 MG disintegrating tablet Take 1 tablet (4 mg total) by mouth every 8 (eight) hours as needed for nausea or vomiting. 08/17/24   Dreama Longs, MD  pantoprazole  (PROTONIX ) 40 MG tablet Take 1 tablet (40 mg total)  by mouth daily. 05/14/24   May, Deanna J, NP  Specialty Vitamins Products (A THRU Z ADVANTAGE) TABS Take 1 tablet by mouth daily. Bariatric vitamin with no iron    [provider]  traZODone  (DESYREL ) 150 MG tablet TAKE 1 AND 1/2 TABLETS BY MOUTH  AT BEDTIME AS NEEDED 08/22/24   Mozingo, Regina Nattalie, NP     Allergies: Amoxicillin-pot clavulanate,  Enoxaparin , Penicillins, Moxifloxacin hcl in nacl, Sodium hydroxide, Amoxicillin, Clavulanic acid, Moxifloxacin hcl, Sodium hyaluronate, and Moxifloxacin   Review of Systems   ROS as per HPI  Physical Exam Updated Vital Signs BP (!) 133/59   Pulse 63   Temp 97.8 F (36.6 C)   Resp 16   Ht 5' 3 (1.6 m)   Wt 86.2 kg   SpO2 100%   BMI 33.66 kg/m  Physical Exam Vitals and nursing note reviewed.  Constitutional:      General: She is not in acute distress.    Appearance: She is well-developed.  HENT:     Head: Normocephalic and atraumatic.  Eyes:     Extraocular Movements: Extraocular movements intact.     Conjunctiva/sclera: Conjunctivae normal.     Pupils: Pupils are equal, round, and reactive to light.     Comments: 20/20 OS, 20/25 OD uncorrected.  Globe soft and symmetric bilaterally  Cardiovascular:     Rate and Rhythm: Normal rate and regular rhythm.     Heart sounds: No murmur heard. Pulmonary:     Effort: Pulmonary effort is normal. No respiratory distress.     Breath sounds: Normal breath sounds.  Abdominal:     Palpations: Abdomen is soft.     Tenderness: There is no abdominal tenderness.  Musculoskeletal:        General: No swelling.     Cervical back: Neck supple.  Skin:    General: Skin is warm and dry.     Capillary Refill: Capillary refill takes less than 2 seconds.  Neurological:     Mental Status: She is alert.  Psychiatric:        Mood and Affect: Mood normal.     ED Course/ Medical Decision Making/ A&P    Procedures Procedures   Medications Ordered in ED Medications - No data to display  Medical Decision Making:   [name] [name] is a 53 y.o. female who presents for intermittent visual changes as per above.  Physical exam is pertinent for no focal abnormalities.   The differential includes but is not limited to ICH, stroke, metabolic derangement, infection, retinal detachment, retinal tear, traumatic iritis.  Independent historian:  None  External data reviewed: No pertinent external data  Labs: Ordered, Independent interpretation, and Details: CBC without leukocytosis, anemia, thrombocytopenia.  CMP without emergent electrolyte derangement or emergent LFT abnormality.  Creatinine minimally elevated at 1.06, from baseline of 0.8-1.  Does not rise to the level of AKI.  Radiology: Ordered, Independent interpretation, Details: Personally reviewed CT of the head, I do not appreciate ICH, displaced fracture, MLS, mass lesion, loss of gray/white matter differentiation, and All images reviewed independently.  Agree with radiology report at this time.   CT Head Wo Contrast Result Date: 10/07/2024 EXAM: CT HEAD WITHOUT CONTRAST 10/07/2024 03:38:07 PM TECHNIQUE: CT of the head was performed without the administration of intravenous contrast. Automated exposure control, iterative reconstruction, and/or weight based adjustment of the mA/kV was utilized to reduce the radiation dose to as low as reasonably achievable. COMPARISON: CT head 01/24/23 CLINICAL HISTORY: Headache, neuro deficit.  FINDINGS: BRAIN AND VENTRICLES: No acute hemorrhage. No evidence of acute infarct. No hydrocephalus. No extra-axial collection. No mass effect or midline shift. ORBITS: No acute abnormality. SINUSES: No acute abnormality. SOFT TISSUES AND SKULL: No acute soft tissue abnormality. No skull fracture. IMPRESSION: 1. No acute intracranial abnormality. Electronically signed by: Gilmore Molt MD 10/07/2024 03:52 PM EDT RP Workstation: [id number]    EKG/Medicine tests: Not indicated EKG Interpretation:                  Interventions: Not indicated  See the EMR for full details regarding lab and imaging results.  Patient presents for intermittent visual changes which are currently resolved.  Patient without signs or symptoms of stroke on exam.  Screening labs obtained in triage and are reassuring, CT head also obtained in triage and is reassuring.  Patient with  soft and symmetric globes, doubt glaucoma, no evidence of retinal tear or retinal hemorrhage/vitreous hemorrhage on point-of-care ultrasound.  Patient unable to fully delineate whether or not symptoms were monocular or binocular, however patient's vision is now back to baseline, therefore do not feel that patient warrants emergent hospitalization.  I did speak with Dr. Austin with ophthalmology who is amenable to following up the patient closely in her clinic, instructed us  to have the patient call her clinic in the a.m. to schedule an appointment, patient amenable to this plan.  Presentation is most consistent with acute complicated illness and I did consider and rule out acute life/limb-threatening illness  Discussion of management or test interpretations with external provider(s): Dr. Austin, ophthalmology  Risk Drugs:None  Disposition: DISCHARGE: I believe that the patient is safe for discharge home with outpatient follow-up. Patient was informed of all pertinent physical exam, laboratory, and imaging findings. Patient's suspected etiology of their symptom presentation was discussed with the patient and all questions were answered. We discussed following up with ophthalmology and PCP. I provided thorough ED return precautions. The patient feels safe and comfortable with this plan.  MDM generated using voice dictation software and may contain dictation errors.  Please contact me for any clarification or with any questions.  Clinical Impression:  1. Visual changes      Discharge   Final Clinical Impression(s) / ED Diagnoses Final diagnoses:  Visual changes    Rx / DC Orders ED Discharge Orders     None        Rogelia Jerilynn RAMAN, MD 10/07/24 1826

## 2024-10-07 NOTE — ED Provider Triage Note (Signed)
 Emergency Medicine Provider Triage Evaluation Note  [name] [name] , a 53 y.o. female  was evaluated in triage.  Pt complains of vision changes. Reports that yesterday at 1730 she was taking a shower and started seeing fireworks, like bright flashing lights in her left eye. Reports that this lasted less than 10 minutes and then spontaneously resolved.  She felt fine after this and then went to bed in good condition.  Woke up this morning with a headache and seeing a smoky haze with gray floaters out of both eyes. Reports my eyes feel tired, like I want to close them but I'm not tired. My headache feels like its from straining to keep my eyes open. Originally went to urgent care, was sent here for evaluation.   Review of Systems  Positive:  Negative:   Physical Exam  BP (!) 142/78 (BP Location: Right Arm)   Pulse 74   Temp 98 F (36.7 C)   Resp 17   Ht 5' 3 (1.6 m)   Wt 86.2 kg   SpO2 100%   BMI 33.66 kg/m  Gen:   Awake, no distress   Resp:  Normal effort  MSK:   Moves extremities without difficulty  Other:  PERRLA, EOMs intact. Vision grossly intact bilaterally  Medical Decision Making  Medically screening exam initiated at 3:15 PM.  Appropriate orders placed.  [name] [name] was informed that the remainder of the evaluation will be completed by another provider, this initial triage assessment does not replace that evaluation, and the importance of remaining in the ED until their evaluation is complete.     Nora Lauraine LABOR, PA-C 10/07/24 1521

## 2024-10-07 NOTE — ED Notes (Signed)
 Patient discharged to home with family. Patient given written and oral discharge instructions. Patient verbalized understanding. Patient ambulatory to exit with steady gait. Patient breathing without difficulty. Patient denies questions, concerns or needs at this time.

## 2024-10-07 NOTE — Discharge Instructions (Signed)
 [name] [name]  Thank you for allowing us  to take care of you today.  You came to the Emergency Department today because you had some visual changes yesterday and today, your vision is currently better.  Here in the emergency department your vision was normal, your CT of your head did not show any abnormalities, and your lab work was reassuring.  We did an ultrasound of your eyes which did not show any evidence of bleeding or tears in the back of your eye.  We did talk to the ophthalmologist, and they will follow you up in clinic.  Please call their clinic at [phone] tomorrow morning to schedule an appointment, tell them on the phone that you were seen in the emergency department, and Dr. Austin told the emergency department doctor to have you call in the morning to schedule appointment.  If you have any recurrent visual this prior to your appointment, please come back to the emergency department for reevaluation, if you do have repeat visual changes try closing your eyes 1 at a time and seeing if this changes your vision, it is helpful to know if the changes in your vision are only present if both of your eyes are open, or are in your left eye versus your right eye.  To-Do: 1. Please follow-up with your primary doctor within 1 month / as soon as possible.   Please return to the Emergency Department or call 911 if you experience have worsening of your symptoms, or do not get better, recurrent or different visual changes, chest pain, shortness of breath, severe or significantly worsening pain, high fever, severe confusion, pass out or have any reason to think that you need emergency medical care.   We hope you feel better soon.   Mitzie Later, MD Department of Emergency Medicine [facility] [place]

## 2024-10-08 DIAGNOSIS — H04123 Dry eye syndrome of bilateral lacrimal glands: Secondary | ICD-10-CM | POA: Diagnosis not present

## 2024-10-08 DIAGNOSIS — H2513 Age-related nuclear cataract, bilateral: Secondary | ICD-10-CM | POA: Diagnosis not present

## 2024-10-11 DIAGNOSIS — M25561 Pain in right knee: Secondary | ICD-10-CM | POA: Diagnosis not present

## 2024-10-12 DIAGNOSIS — M76822 Posterior tibial tendinitis, left leg: Secondary | ICD-10-CM | POA: Diagnosis not present

## 2024-10-25 ENCOUNTER — Other Ambulatory Visit: Payer: Self-pay | Admitting: Physician Assistant

## 2024-10-25 DIAGNOSIS — F411 Generalized anxiety disorder: Secondary | ICD-10-CM

## 2024-10-26 NOTE — Telephone Encounter (Signed)
 Verify pharmacy, last fill at [facility] in [place]

## 2024-10-26 NOTE — Telephone Encounter (Signed)
 Pt called at 1:10p.  She wants the Ativan  sent to [facility] [address] in [place].  She said she is having surgery on Tuesday and she has 2 pills left.  Next appt 1/27

## 2024-11-19 ENCOUNTER — Other Ambulatory Visit: Payer: Self-pay | Admitting: Physician Assistant

## 2024-11-19 NOTE — Telephone Encounter (Signed)
 Postpone, too early

## 2024-12-05 ENCOUNTER — Ambulatory Visit: Admitting: Dermatology

## 2024-12-13 ENCOUNTER — Inpatient Hospital Stay

## 2024-12-13 ENCOUNTER — Telehealth: Payer: Self-pay

## 2024-12-13 ENCOUNTER — Inpatient Hospital Stay: Attending: Hematology and Oncology | Admitting: Hematology and Oncology

## 2024-12-13 ENCOUNTER — Encounter: Payer: Self-pay | Admitting: Hematology and Oncology

## 2024-12-13 ENCOUNTER — Other Ambulatory Visit: Payer: Self-pay

## 2024-12-13 VITALS — BP 148/75 | HR 78 | Temp 98.5°F | Resp 18 | Ht 63.0 in | Wt 195.0 lb

## 2024-12-13 DIAGNOSIS — D508 Other iron deficiency anemias: Secondary | ICD-10-CM

## 2024-12-13 DIAGNOSIS — D509 Iron deficiency anemia, unspecified: Secondary | ICD-10-CM | POA: Insufficient documentation

## 2024-12-13 DIAGNOSIS — E538 Deficiency of other specified B group vitamins: Secondary | ICD-10-CM

## 2024-12-13 LAB — CBC WITH DIFFERENTIAL (CANCER CENTER ONLY)
Abs Immature Granulocytes: 0.01 K/uL (ref 0.00–0.07)
Basophils Absolute: 0.1 K/uL (ref 0.0–0.1)
Basophils Relative: 1 %
Eosinophils Absolute: 0.2 K/uL (ref 0.0–0.5)
Eosinophils Relative: 3 %
HCT: 39.5 % (ref 36.0–46.0)
Hemoglobin: 12.7 g/dL (ref 12.0–15.0)
Immature Granulocytes: 0 %
Lymphocytes Relative: 19 %
Lymphs Abs: 1.1 K/uL (ref 0.7–4.0)
MCH: 28.9 pg (ref 26.0–34.0)
MCHC: 32.2 g/dL (ref 30.0–36.0)
MCV: 90 fL (ref 80.0–100.0)
Monocytes Absolute: 0.5 K/uL (ref 0.1–1.0)
Monocytes Relative: 8 %
Neutro Abs: 4.3 K/uL (ref 1.7–7.7)
Neutrophils Relative %: 69 %
Platelet Count: 247 K/uL (ref 150–400)
RBC: 4.39 MIL/uL (ref 3.87–5.11)
RDW: 13.6 % (ref 11.5–15.5)
WBC Count: 6.1 K/uL (ref 4.0–10.5)
nRBC: 0 % (ref 0.0–0.2)

## 2024-12-13 LAB — FOLATE: Folate: 16 ng/mL (ref >5.9)

## 2024-12-13 LAB — IRON AND TIBC
Iron: 124 ug/dL (ref 28–170)
Saturation Ratios: 34 % — ABNORMAL HIGH (ref 10.4–31.8)
TIBC: 368 ug/dL (ref 250–450)
UIBC: 244 ug/dL

## 2024-12-13 LAB — FERRITIN: Ferritin: 50 ng/mL (ref 11–307)

## 2024-12-13 LAB — VITAMIN B12: Vitamin B-12: 876 pg/mL (ref 180–914)

## 2024-12-13 MED ORDER — ONDANSETRON HCL 4 MG PO TABS: 4.0000 mg | ORAL_TABLET | Freq: Three times a day (TID) | ORAL | 0 refills | Status: AC | PRN

## 2024-12-13 NOTE — Progress Notes (Cosign Needed)
 [name]  [address] [place],  [place]  [address] [phone]  Clinic Day:  12/13/2024  Referring physician: Clemmie Nest, MD   HISTORY OF PRESENT ILLNESS:  The patient is a 53 y.o. female with iron deficiency anemia originally felt to be due to heavy menses.  She has received IV iron in the past, most recently in April 2023, but has not had evidence of recurrent iron deficiency since that time as she went through menopause in 2023.  She has had gastric bypass surgery, so she also likely has decreased absorption.  She states she eats a well-balanced diet.  She has tried multiple different iron supplements including a multivitamin with iron and did not tolerate them due to nausea.  She is here today for repeat clinical assessment.  She reports progressive fatigue concerning for recurrent anemia.  She denies any overt form of blood loss.  She had postmenopausal bleeding in September. She saw her gynecologist and had transvaginal ultrasound, which was normal. She states she continues a multivitamin daily.  She had a recent surgery on her foot after injury earlier this year.  She has intermittent nausea, for which she has used ondansetron  earlier this year.  She requests a refill of that today.   VITALS:   Blood pressure (!) 148/75, pulse 78, temperature 98.5 F (36.9 C), temperature source Oral, resp. rate 18, height 5' 3 (1.6 m), weight 195 lb (88.5 kg), SpO2 95%. Wt Readings from Last 3 Encounters:  12/13/24 195 lb (88.5 kg)  10/07/24 190 lb (86.2 kg)  08/17/24 189 lb (85.7 kg)   Body mass index is 34.54 kg/m.  Performance status (ECOG): 1 - Symptomatic but completely ambulatory  PHYSICAL EXAM:   Physical Exam Vitals and nursing note reviewed.  Constitutional:      General: She is not in acute distress.    Appearance: Normal appearance. She is not ill-appearing.  HENT:     Head: Normocephalic and atraumatic.     Mouth/Throat:     Mouth: Mucous  membranes are moist.     Pharynx: Oropharynx is clear. No oropharyngeal exudate or posterior oropharyngeal erythema.  Eyes:     General: No scleral icterus.    Extraocular Movements: Extraocular movements intact.     Conjunctiva/sclera: Conjunctivae normal.     Pupils: Pupils are equal, round, and reactive to light.  Cardiovascular:     Rate and Rhythm: Normal rate and regular rhythm.     Heart sounds: Normal heart sounds. No murmur heard.    No friction rub. No gallop.  Pulmonary:     Effort: Pulmonary effort is normal.     Breath sounds: Normal breath sounds. No wheezing, rhonchi or rales.  Abdominal:     General: There is no distension.     Palpations: Abdomen is soft. There is no mass.     Tenderness: There is no abdominal tenderness.  Musculoskeletal:        General: Normal range of motion.     Cervical back: Normal range of motion and neck supple. No tenderness.     Right lower leg: No edema.     Left lower leg: No edema.  Lymphadenopathy:     Cervical: No cervical adenopathy.  Skin:    General: Skin is warm and dry.     Coloration: Skin is not jaundiced.     Findings: No rash.  Neurological:     Mental Status: She is alert and oriented to person, place, and  time.     Cranial Nerves: No cranial nerve deficit.  Psychiatric:        Mood and Affect: Mood normal.        Behavior: Behavior normal.        Thought Content: Thought content normal.      LABS:      Latest Ref Rng & Units 12/13/2024    9:42 AM 10/07/2024    3:15 PM 08/17/2024    2:20 PM  CBC  WBC 4.0 - 10.5 K/uL 6.1  8.3  5.5   Hemoglobin 12.0 - 15.0 g/dL 87.2  85.5  86.1   Hematocrit 36.0 - 46.0 % 39.5  45.0  44.1   Platelets 150 - 400 K/uL 247  336  298       Latest Ref Rng & Units 10/07/2024    3:15 PM 08/17/2024    2:20 PM 06/22/2024   10:16 AM  CMP  Glucose 70 - 99 mg/dL 864  881  871   BUN 6 - 20 mg/dL 13  9  12    Creatinine 0.44 - 1.00 mg/dL 8.93  9.20  8.95   Sodium 135 - 145 mmol/L 139   139  141   Potassium 3.5 - 5.1 mmol/L 4.2  4.0  3.6   Chloride 98 - 111 mmol/L 104  105  105   CO2 22 - 32 mmol/L 27  22  24    Calcium 8.9 - 10.3 mg/dL 8.8  9.2  9.7   Total Protein 6.5 - 8.1 g/dL 6.3  7.1  6.7   Total Bilirubin 0.0 - 1.2 mg/dL 0.6  0.6  0.3   Alkaline Phos 38 - 126 U/L 84  87  113   AST 15 - 41 U/L 16  16  21    ALT 0 - 44 U/L 10  10  12      Lab Results  Component Value Date   TIBC 368 12/13/2024   TIBC 431 06/22/2024   TIBC 395 05/01/2024   FERRITIN 50 12/13/2024   FERRITIN 23 06/22/2024   FERRITIN 31 05/01/2024   IRONPCTSAT 34 (H) 12/13/2024   IRONPCTSAT 27 06/22/2024   IRONPCTSAT 29 05/01/2024   Lab Results  Component Value Date   LDH 94 (L) 03/30/2016       Component Value Date/Time   LDH 94 (L) 03/30/2016 1830    Review Flowsheet  More data exists      Latest Ref Rng & Units 05/01/2024 06/22/2024 12/13/2024  Oncology Labs  Ferritin 11 - 307 ng/mL 31  23  50   %SAT 10.4 - 31.8 % 29  27  34      STUDIES:   No results found.    ASSESSMENT & PLAN:   Assessment/Plan:  53 y.o. female with iron urgency anemia most likely due to decreased absorption after gastric bypass.  Her hemoglobin remains normal, but has decreased from previous, but she does not have evidence of recurrent iron deficiency.  I gave her ondansetron  as requested, but asked her to follow-up with her PCP and/or GI.  I will plan to see her back in 3 months for repeat clinical assessment.  The patient understands all the plans discussed today and is in agreement with them.  She knows to contact our office if she develops concerns prior to her next appointment.     Andrez DELENA Foy, PA-C   Physician Assistant [facility] [place] [phone]

## 2024-12-13 NOTE — Telephone Encounter (Signed)
 My Chart message sent

## 2024-12-13 NOTE — Telephone Encounter (Signed)
-----   Message from [name] [name] sent at 12/13/2024  2:26 PM EST ----- Please let her know her vitamin levels are good. Keep appt in 3 months. Thanks

## 2024-12-31 ENCOUNTER — Telehealth: Payer: Self-pay | Admitting: Physician Assistant

## 2024-12-31 NOTE — Telephone Encounter (Signed)
 Pt states her mom is sick and she is not sleeping well. Wanted to know if she can be prescribed something for it.

## 2025-01-01 NOTE — Telephone Encounter (Signed)
 Increase the Trazodone  150 mg to 2 po at bedtime. Don't take more than 1 mg of Ativan  at night.   Please make sure she has a f/u appt w/ me in the next month or so.

## 2025-01-01 NOTE — Telephone Encounter (Signed)
 Pt last seen 9/22:  Continue BuSpar  15 mg, 1 p.o. 4 times daily.  She prefers to take it that way instead of 30 mg twice daily. Continue Ativan  1 mg, 1/2-1 nightly as needed anxiety. Continue Trazodone  150 mg, 1.5 pills nightly. Continue therapy with Heather Replogle.  Return in 4 months.  She is reporting her mother has metastatic stage IV cancer. Pt is not sleeping and said if she can't sleep she can't function. She reports taking trazodone  at 225 mg and taking Ativan  2 mg. She is not prescribed this dose but reports she needs it to sleep.   She asks about Ambien , but per note it caused hallucinations.  Past medications for mental health diagnoses include: Gabapentin , Hydroxyzine , Ambien  caused hallucinations, maybe Lunesta but unsure what happened, maybe prazosin , lithium , Invega  injection, Lamictal  caused dizziness, brain fog, and fatigue. Ativan  helped. Mirtazepine didn't work.    [facility] [address] [place]

## 2025-01-01 NOTE — Telephone Encounter (Signed)
 Recommendations reviewed with patient. She has FU 1/27.

## 2025-01-02 ENCOUNTER — Telehealth: Payer: Self-pay | Admitting: Physician Assistant

## 2025-01-02 NOTE — Telephone Encounter (Signed)
 Pt's friend, Leonette, lvm ar 4:31p asking for teresa to return her call.  She is on DPR.  She said pt is having some issues with something related to her mom's cancer diagnosis (I couldn't make out what she said other than that).  Pls call her back at [phone]  Next appt 1/27

## 2025-01-03 ENCOUNTER — Other Ambulatory Visit: Payer: Self-pay | Admitting: Physician Assistant

## 2025-01-03 MED ORDER — ZOLPIDEM TARTRATE 10 MG PO TABS: 5.0000 mg | ORAL_TABLET | Freq: Every evening | ORAL | 0 refills | Status: AC | PRN

## 2025-01-03 NOTE — Telephone Encounter (Signed)
 Patient's friend, Leonette, called asking for something for sleep for patient. She reports patient has previously taken 300 mg of trazodone  and the Ativan  and is not sleeping, unable to function without sleep. Friend asks if you will prescribe something else. She mentioned Ambien , but as noted below she had hallucinations with it. Reporting if appt is needed will come in.   Past medications for mental health diagnoses include: Gabapentin , Hydroxyzine , Ambien  caused hallucinations, maybe Lunesta but unsure what happened, maybe prazosin , lithium , Invega  injection, Lamictal  caused dizziness, brain fog, and fatigue. Ativan  helped. Mirtazepine didn't work.   [facility] [place]

## 2025-01-03 NOTE — Telephone Encounter (Signed)
 Let her know I sent in a few Ambien  but don't take unless absolutely necessary.  Thanks

## 2025-01-03 NOTE — Telephone Encounter (Signed)
 LM with information and to call back for further questions or concerns

## 2025-01-22 ENCOUNTER — Ambulatory Visit: Admitting: Physician Assistant

## 2025-02-05 ENCOUNTER — Other Ambulatory Visit

## 2025-02-25 ENCOUNTER — Ambulatory Visit: Admitting: Physician Assistant

## 2025-03-14 ENCOUNTER — Inpatient Hospital Stay

## 2025-03-14 ENCOUNTER — Inpatient Hospital Stay: Admitting: Hematology and Oncology

## 2025-07-11 ENCOUNTER — Ambulatory Visit: Admitting: Dermatology
